# Patient Record
Sex: Male | Born: 1935
Health system: Southern US, Community
[De-identification: ages and names within clinical notes are randomized; demographics above are authoritative.]

## PROBLEM LIST (undated history)

## (undated) DIAGNOSIS — K922 Gastrointestinal hemorrhage, unspecified: Secondary | ICD-10-CM

## (undated) DIAGNOSIS — T7840XA Allergy, unspecified, initial encounter: Secondary | ICD-10-CM

## (undated) DIAGNOSIS — I10 Essential (primary) hypertension: Secondary | ICD-10-CM

## (undated) DIAGNOSIS — K219 Gastro-esophageal reflux disease without esophagitis: Secondary | ICD-10-CM

## (undated) DIAGNOSIS — K635 Polyp of colon: Secondary | ICD-10-CM

## (undated) DIAGNOSIS — K920 Hematemesis: Secondary | ICD-10-CM

## (undated) DIAGNOSIS — I509 Heart failure, unspecified: Secondary | ICD-10-CM

## (undated) DIAGNOSIS — J439 Emphysema, unspecified: Secondary | ICD-10-CM

## (undated) DIAGNOSIS — K819 Cholecystitis, unspecified: Secondary | ICD-10-CM

## (undated) DIAGNOSIS — I2510 Atherosclerotic heart disease of native coronary artery without angina pectoris: Secondary | ICD-10-CM

## (undated) DIAGNOSIS — C61 Malignant neoplasm of prostate: Secondary | ICD-10-CM

## (undated) DIAGNOSIS — F17200 Nicotine dependence, unspecified, uncomplicated: Secondary | ICD-10-CM

## (undated) DIAGNOSIS — IMO0002 Reserved for concepts with insufficient information to code with codable children: Secondary | ICD-10-CM

## (undated) DIAGNOSIS — R011 Cardiac murmur, unspecified: Secondary | ICD-10-CM

## (undated) DIAGNOSIS — M1990 Unspecified osteoarthritis, unspecified site: Secondary | ICD-10-CM

## (undated) DIAGNOSIS — J449 Chronic obstructive pulmonary disease, unspecified: Secondary | ICD-10-CM

## (undated) DIAGNOSIS — K859 Acute pancreatitis without necrosis or infection, unspecified: Secondary | ICD-10-CM

## (undated) DIAGNOSIS — E119 Type 2 diabetes mellitus without complications: Secondary | ICD-10-CM

## (undated) DIAGNOSIS — E7800 Pure hypercholesterolemia, unspecified: Secondary | ICD-10-CM

## (undated) DIAGNOSIS — B019 Varicella without complication: Secondary | ICD-10-CM

## (undated) HISTORY — DX: Cardiac murmur, unspecified: R01.1

## (undated) HISTORY — DX: Hematemesis: K92.0

## (undated) HISTORY — PX: HEMORRHOID SURGERY: SHX153

## (undated) HISTORY — DX: Cholecystitis, unspecified: K81.9

## (undated) HISTORY — PX: APPENDECTOMY: SHX54

## (undated) HISTORY — PX: COLONOSCOPY: SHX174

## (undated) HISTORY — DX: Emphysema, unspecified: J43.9

## (undated) HISTORY — DX: Polyp of colon: K63.5

## (undated) HISTORY — DX: Nicotine dependence, unspecified, uncomplicated: F17.200

## (undated) HISTORY — DX: Essential (primary) hypertension: I10

## (undated) HISTORY — PX: SP CHOLECYSTOMY: HXRAD409

## (undated) HISTORY — PX: CHOLECYSTECTOMY: SHX55

## (undated) HISTORY — DX: Unspecified osteoarthritis, unspecified site: M19.90

## (undated) HISTORY — DX: Allergy, unspecified, initial encounter: T78.40XA

## (undated) HISTORY — DX: Varicella without complication: B01.9

## (undated) HISTORY — PX: CATARACT EXTRACTION: SUR2

## (undated) HISTORY — PX: CIRCUMCISION: SUR203

## (undated) HISTORY — DX: Chronic obstructive pulmonary disease, unspecified: J44.9

## (undated) HISTORY — DX: Pure hypercholesterolemia, unspecified: E78.00

## (undated) HISTORY — DX: Malignant neoplasm of prostate: C61

## (undated) HISTORY — PX: CARDIAC CATHETERIZATION: SHX172

## (undated) HISTORY — DX: Heart failure, unspecified: I50.9

## (undated) HISTORY — DX: Reserved for concepts with insufficient information to code with codable children: IMO0002

## (undated) HISTORY — DX: Atherosclerotic heart disease of native coronary artery without angina pectoris: I25.10

## (undated) HISTORY — DX: Acute pancreatitis without necrosis or infection, unspecified: K85.90

## (undated) HISTORY — DX: Gastro-esophageal reflux disease without esophagitis: K21.9

## (undated) HISTORY — PX: STOMACH SURGERY: SHX791

## (undated) HISTORY — DX: Gastrointestinal hemorrhage, unspecified: K92.2

---

## 1978-04-11 DIAGNOSIS — K922 Gastrointestinal hemorrhage, unspecified: Secondary | ICD-10-CM

## 1978-04-11 HISTORY — DX: Gastrointestinal hemorrhage, unspecified: K92.2

## 2004-09-07 ENCOUNTER — Ambulatory Visit: Payer: Self-pay | Admitting: Ophthalmology

## 2005-02-07 ENCOUNTER — Ambulatory Visit: Payer: Self-pay | Admitting: Unknown Physician Specialty

## 2005-03-29 ENCOUNTER — Ambulatory Visit: Payer: Self-pay | Admitting: Urology

## 2005-03-29 IMAGING — NM NUCLEAR MEDICINE WHOLE BODY BONE SCINTIGRAPHY
1 series · 2 of 2 positions shown · non-contrast
Comparison: none

REASON FOR EXAM: Prostate CA
COMMENTS:

[Series 1: 3 hr wholebody · 2.40mm/px · 2 of 2 frames shown]
[frame 1/2]
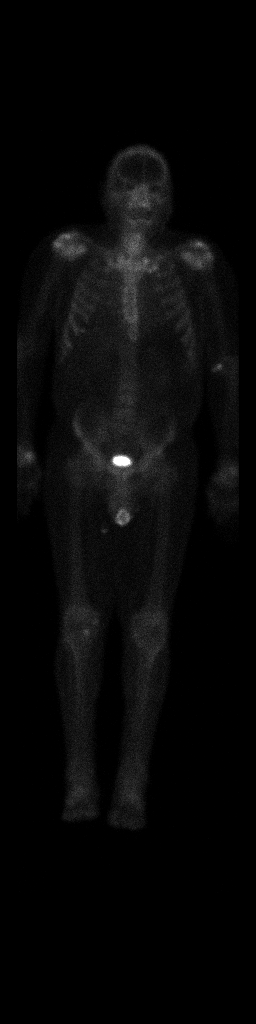
[frame 2/2]
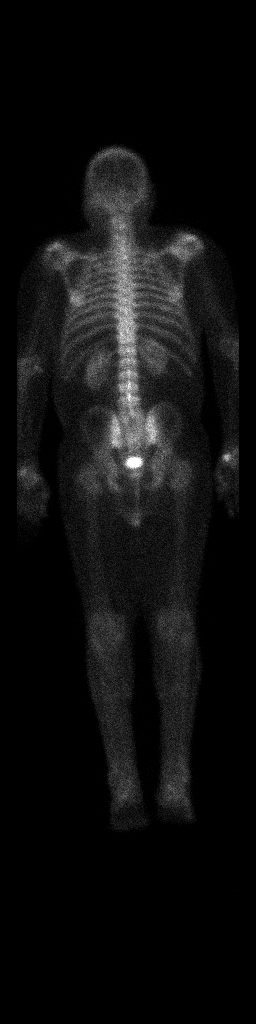

[2 of 2 positions shown; findings below may reference images not displayed]

PROCEDURE:     NM  - NM BONE WB 3 HR [DATE] [DATE]

RESULT:        The patient received an injection of 20.08 mCi Tc 99m labeled
MDP for the study.  Injection site in the LEFT antecubital region is
visible.  There is increased localization in the LEFT sternoclavicular
joint, both knees especially medially on the RIGHT and in the RIGHT wrist in
the area of the base of the thumb.  Degenerative changes are also noted in
the shoulders.
IMPRESSION: Some areas of abnormal localization as described which most likely represent
degenerative changes.  No definite evidence of metastatic disease.

## 2005-04-14 ENCOUNTER — Ambulatory Visit: Payer: Self-pay | Admitting: Radiation Oncology

## 2005-04-19 IMAGING — CT CT GUIDANCE PLACEMENT RAD THERAPY FIELDS
1 series · 16 of 32 positions shown, 20 images · non-contrast
Comparison: none

[Series 2: tx planning · axial · 0.98mm/px · z∈[-148,+110]mm · 16 of 113 slices shown, 20 images]
[im 8/113  soft-tissue]
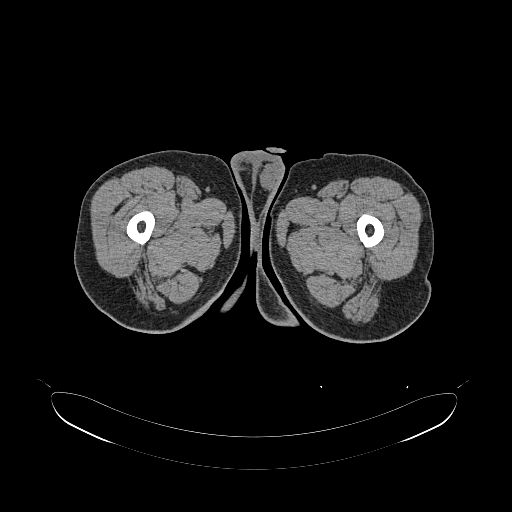
[im 8/113  bone]
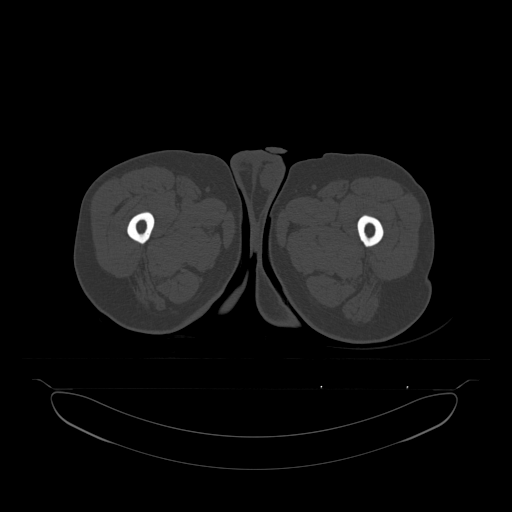
[im 15/113  soft-tissue]
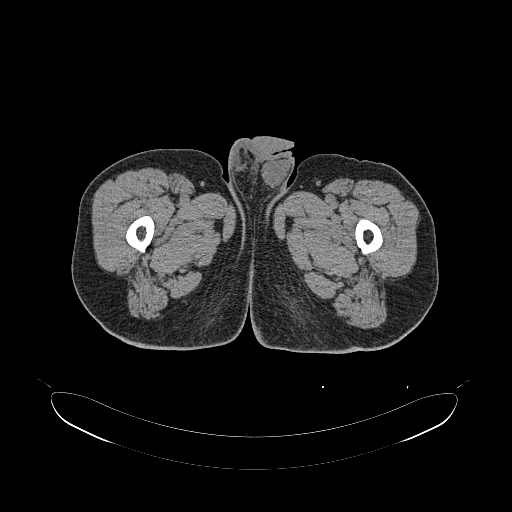
[im 22/113  soft-tissue]
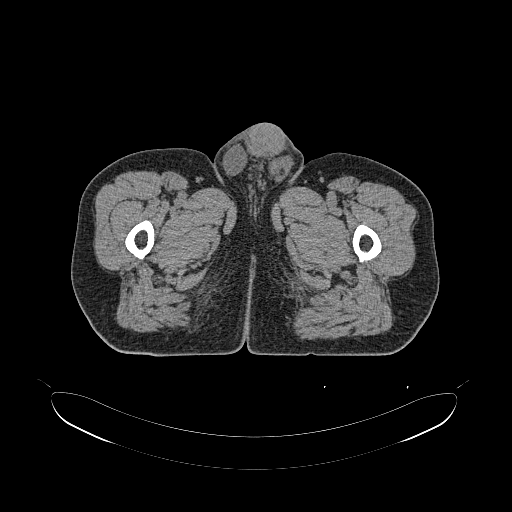
[im 29/113  soft-tissue]
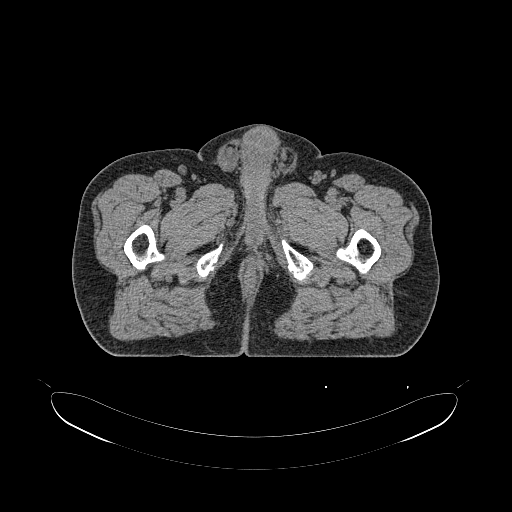
[im 37/113  soft-tissue]
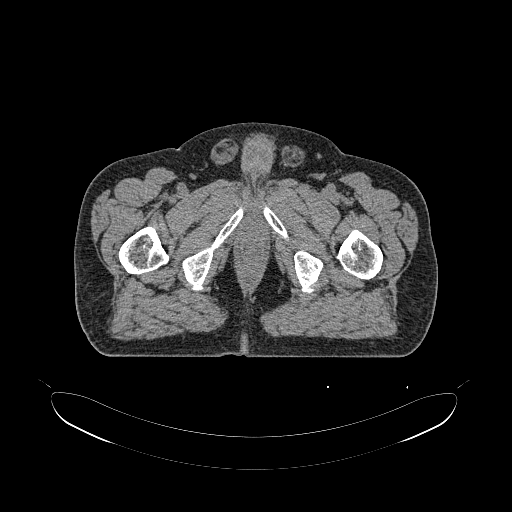
[im 44/113  soft-tissue]
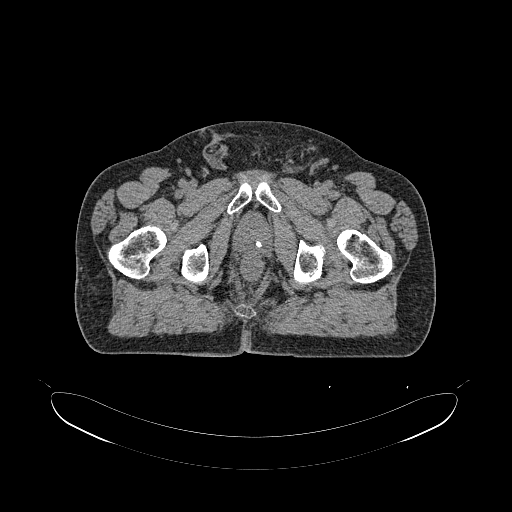
[im 51/113  soft-tissue]
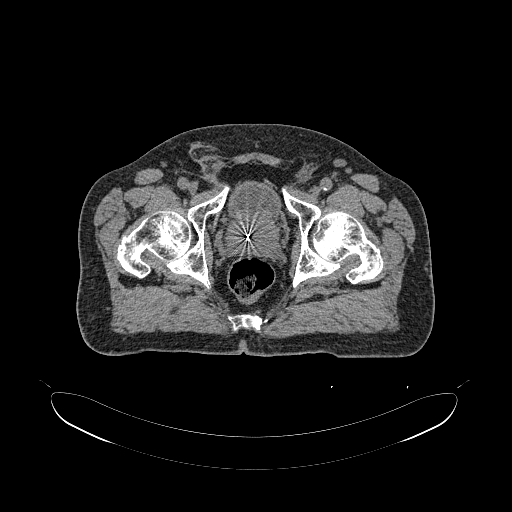
[im 62/113  soft-tissue]
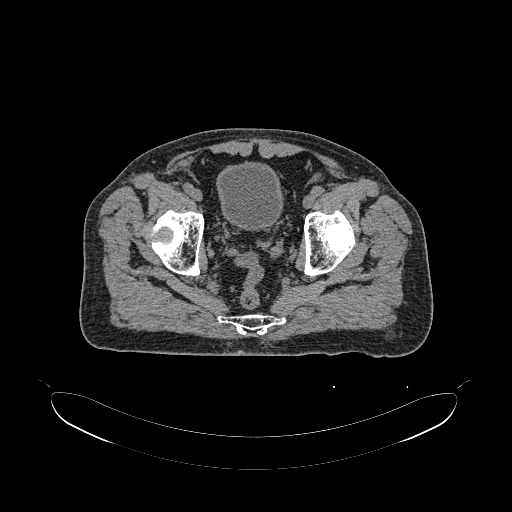
[im 69/113  soft-tissue]
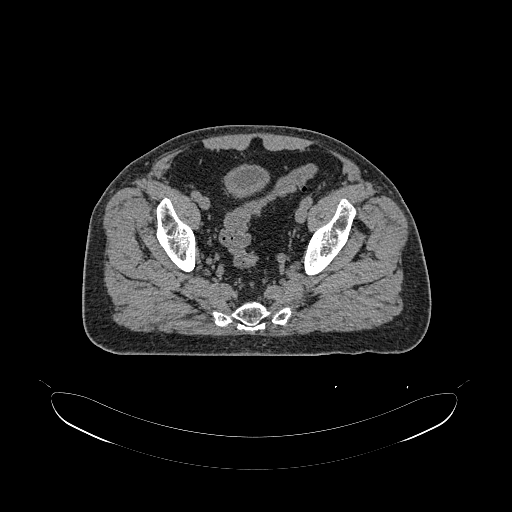
[im 69/113  bone]
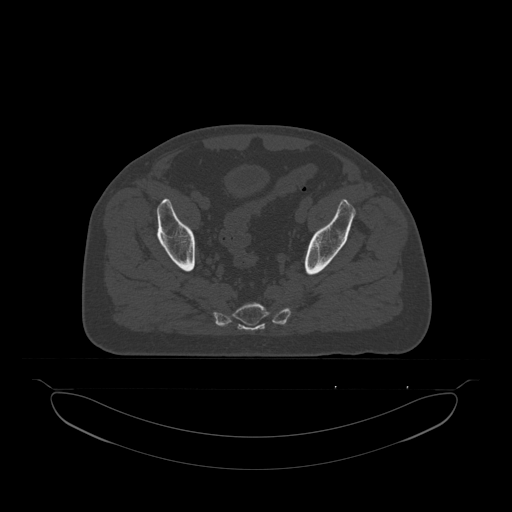
[im 76/113  soft-tissue]
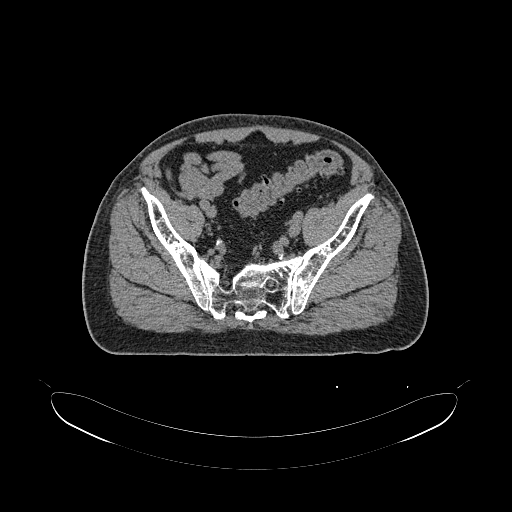
[im 84/113  soft-tissue]
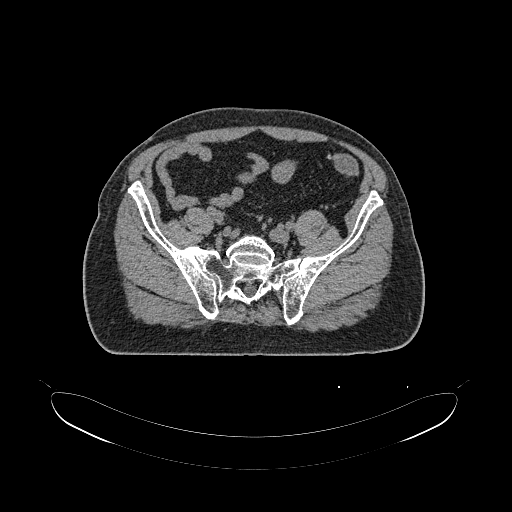
[im 91/113  soft-tissue]
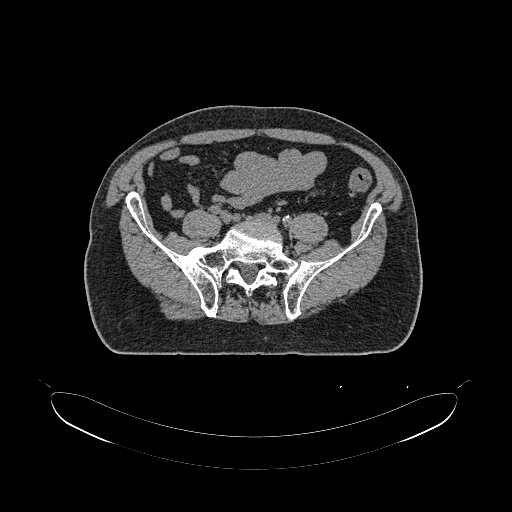
[im 98/113  soft-tissue]
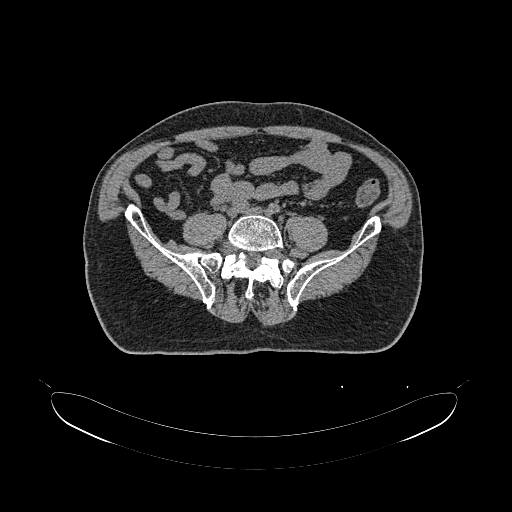
[im 98/113  lung]
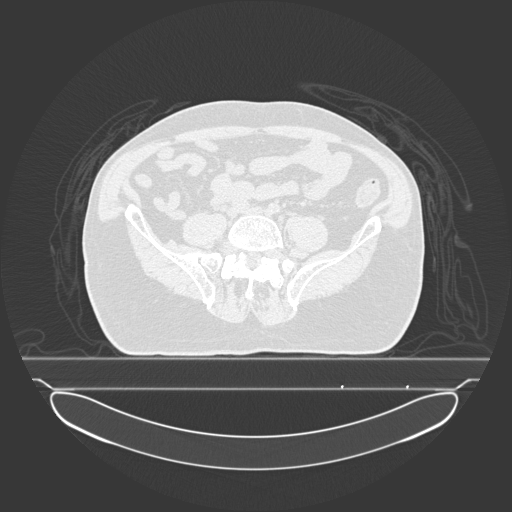
[im 102/113  lung]
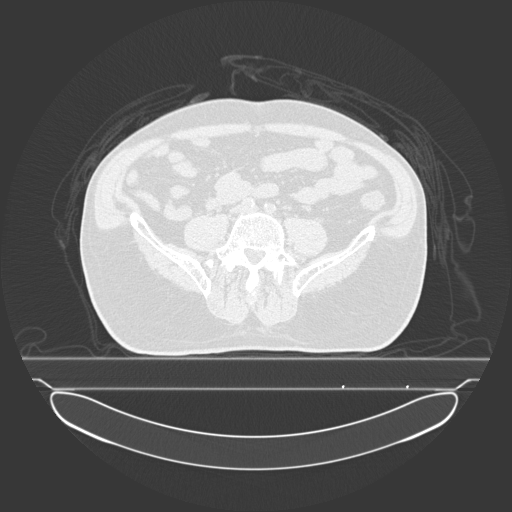
[im 105/113  soft-tissue]
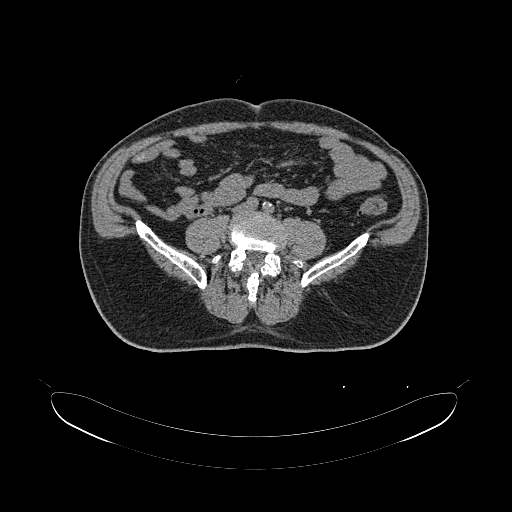
[im 105/113  lung]
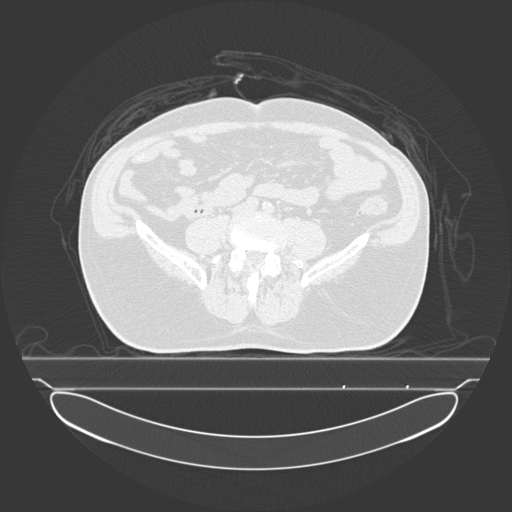
[im 109/113  lung]
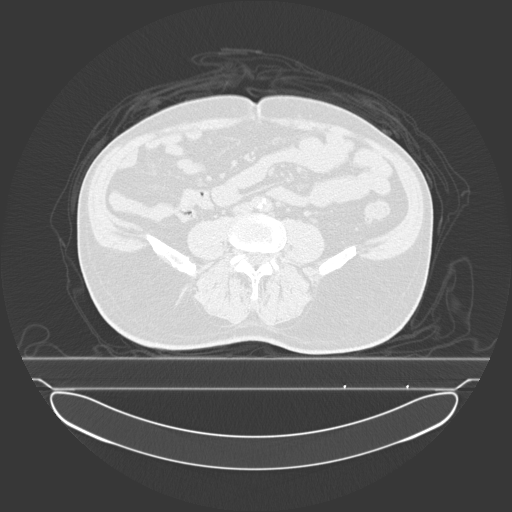

[16 of 32 positions shown; findings below may reference images not displayed]

IMAGES IMPORTED FROM THE [employer] WORKFLOW SYSTEM
NO DICTATION FOR STUDY

## 2005-05-12 ENCOUNTER — Ambulatory Visit: Payer: Self-pay | Admitting: Radiation Oncology

## 2005-06-09 ENCOUNTER — Ambulatory Visit: Payer: Self-pay | Admitting: Radiation Oncology

## 2005-07-10 ENCOUNTER — Ambulatory Visit: Payer: Self-pay | Admitting: Radiation Oncology

## 2005-10-05 ENCOUNTER — Ambulatory Visit: Payer: Self-pay | Admitting: Unknown Physician Specialty

## 2006-04-09 ENCOUNTER — Other Ambulatory Visit: Payer: Self-pay

## 2006-04-09 ENCOUNTER — Emergency Department: Payer: Self-pay | Admitting: Unknown Physician Specialty

## 2006-04-09 IMAGING — CR DG CHEST 1V PORT
1 series · 1 of 1 positions shown · non-contrast
Comparison: none

REASON FOR EXAM: Chest pain
COMMENTS:

PROCEDURE:     DXR - DXR PORTABLE CHEST SINGLE VIEW  - [DATE]  [DATE]
RESULT:     The mediastinal and hilar structures are normal. The lungs are
clear. The cardiovascular structures are unremarkable.

[view not recorded]
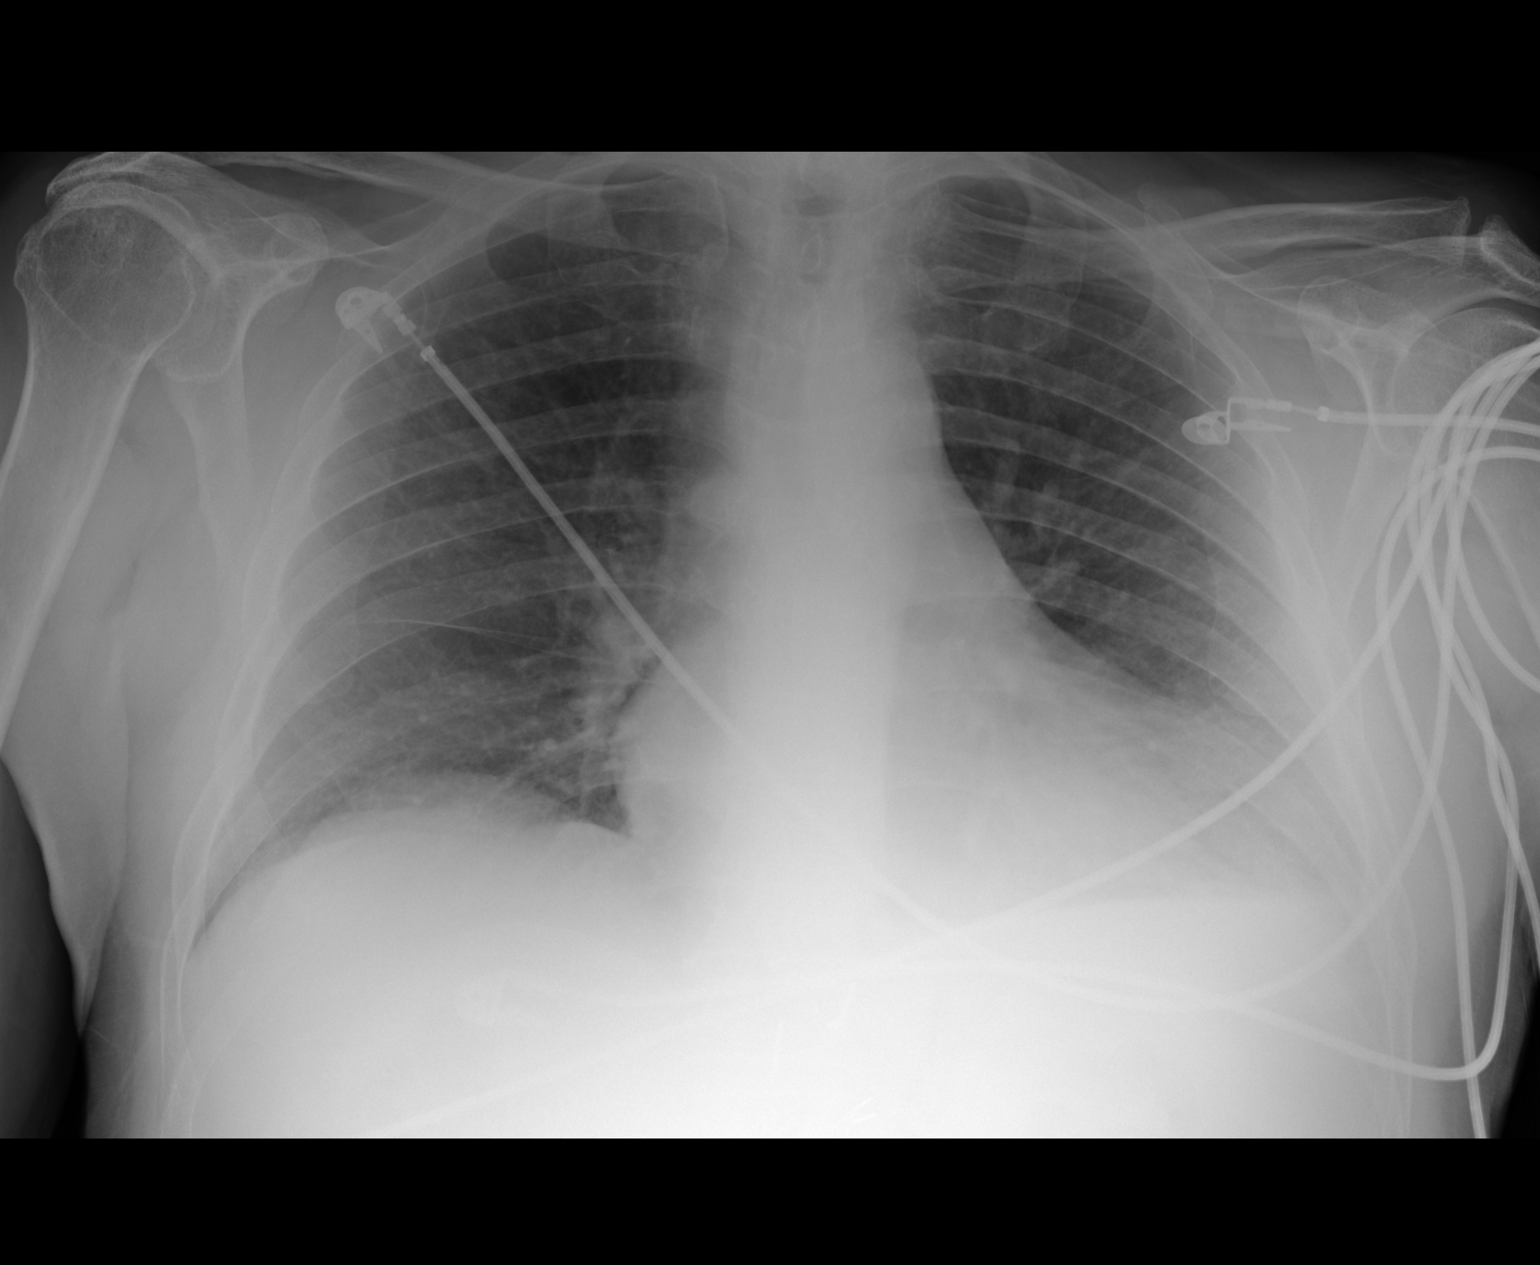

[1 of 1 positions shown; findings below may reference images not displayed]

IMPRESSION: No acute cardiopulmonary disease.

## 2006-04-09 IMAGING — CT CT CHEST W/ CM
2 series · 15 of 31 positions shown, 19 images · IV contrast (APPLIED)
Comparison: none

REASON FOR EXAM: cp ddimer high
COMMENTS:

[Series 4: soft tissue · axial · 0.79mm/px · z∈[-607,-565]mm · 2 of 94 slices shown]
[im 8/94  mediastinal]
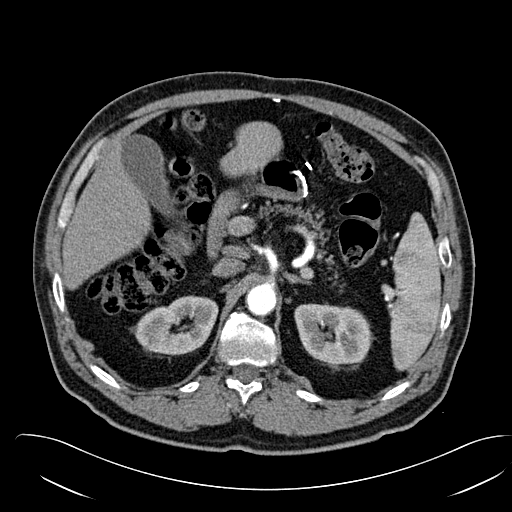
[im 22/94  mediastinal]
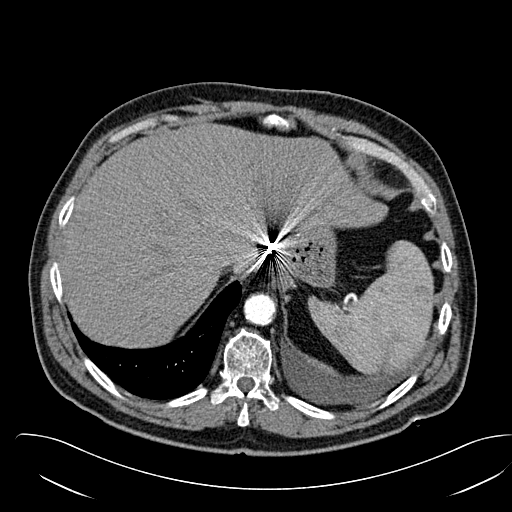

[Series 5: lung windows · axial · 0.79mm/px · z∈[-601,-370]mm · 13 of 91 slices shown, 17 images]
[im 7/91  mediastinal]
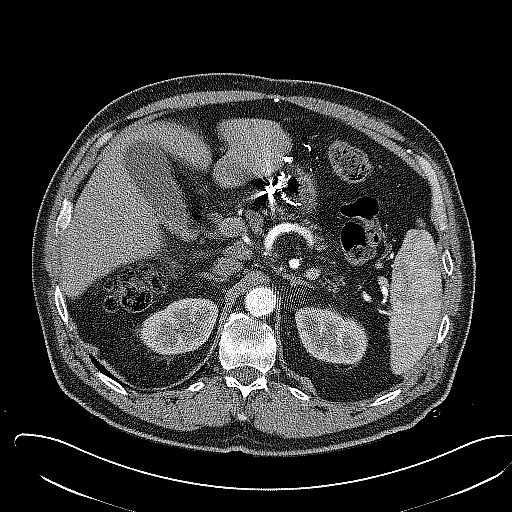
[im 7/91  lung]
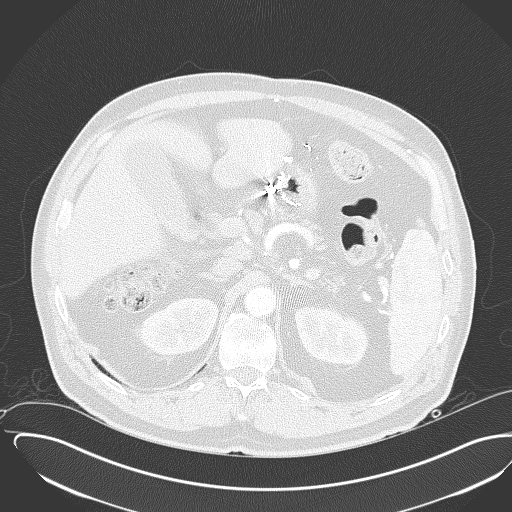
[im 14/91  lung]
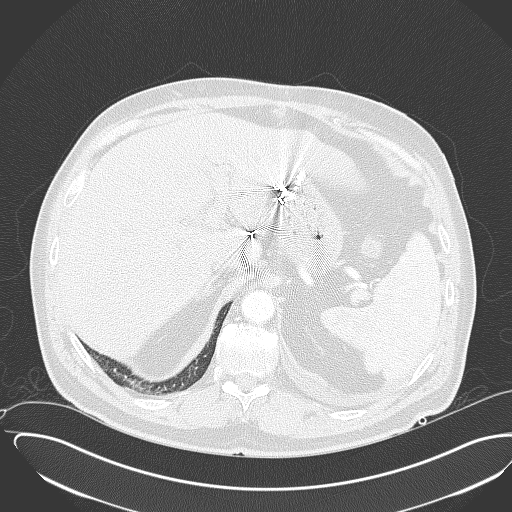
[im 21/91  lung]
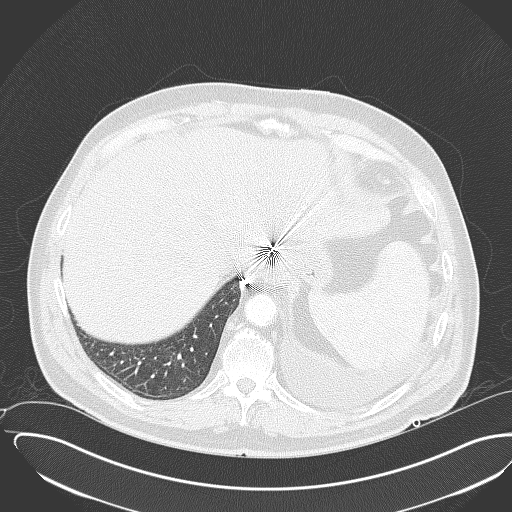
[im 28/91  lung]
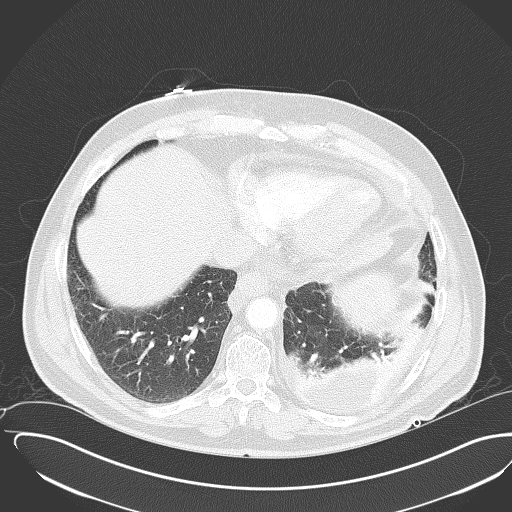
[im 35/91  mediastinal]
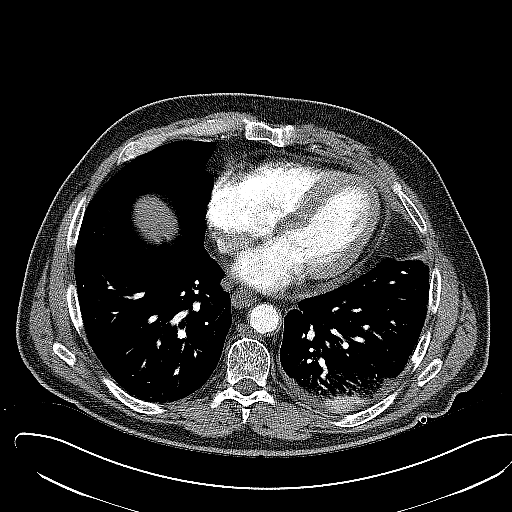
[im 35/91  lung]
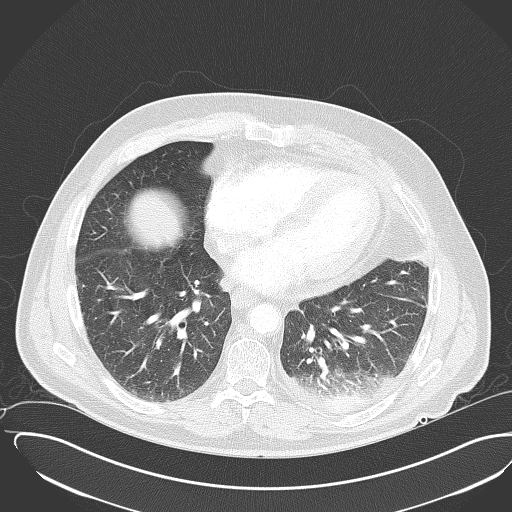
[im 42/91  lung]
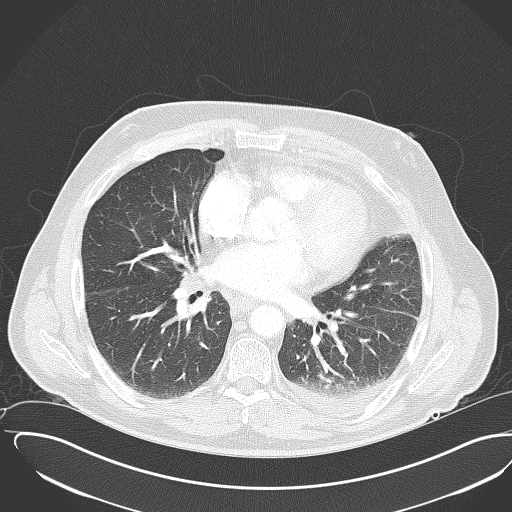
[im 46/91  lung]
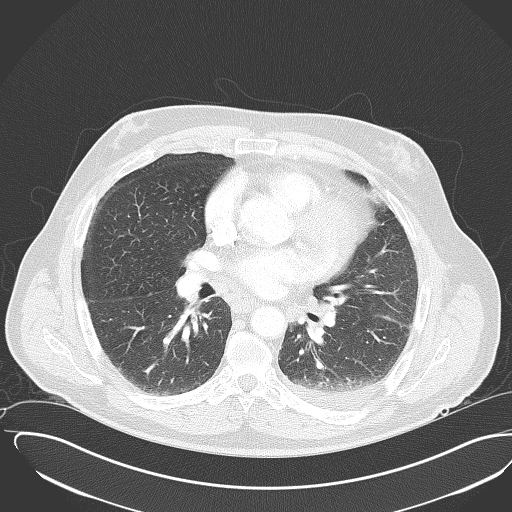
[im 49/91  lung]
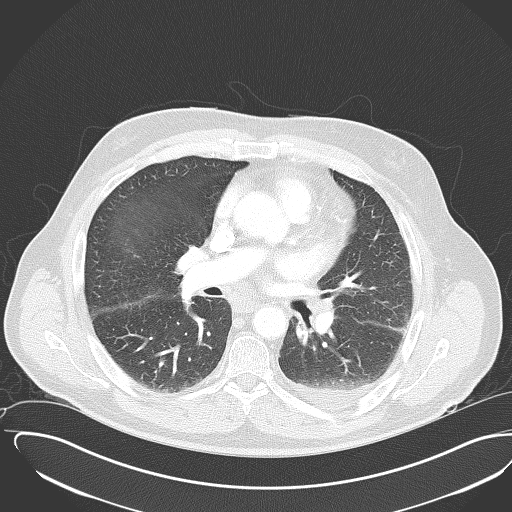
[im 56/91  mediastinal]
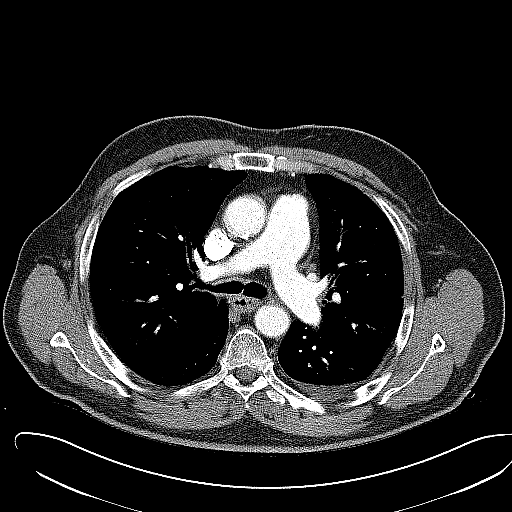
[im 56/91  lung]
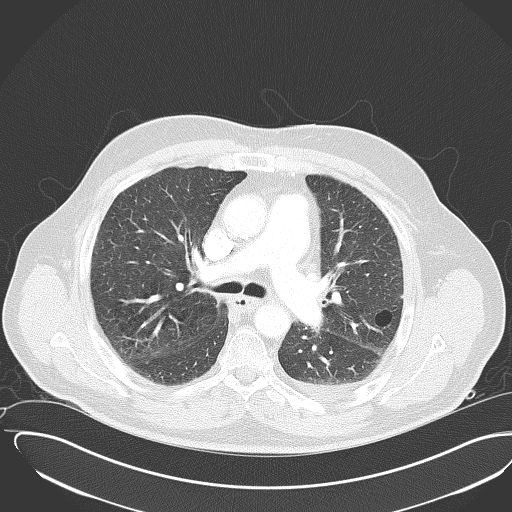
[im 63/91  lung]
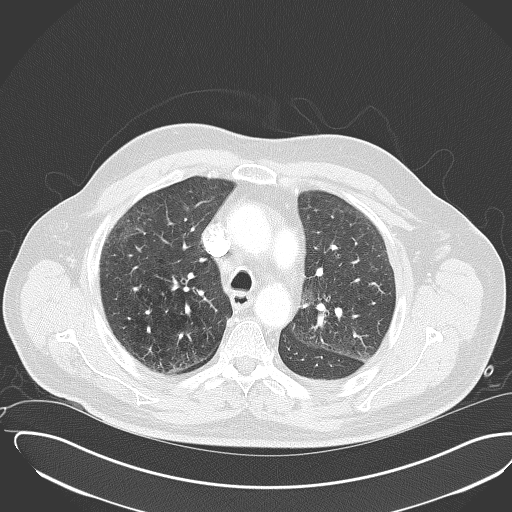
[im 70/91  lung]
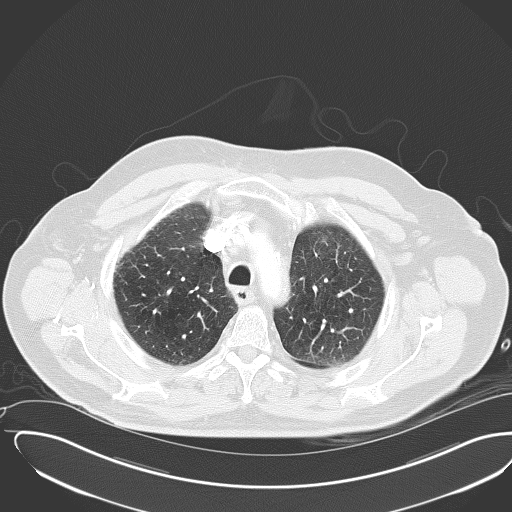
[im 77/91  lung]
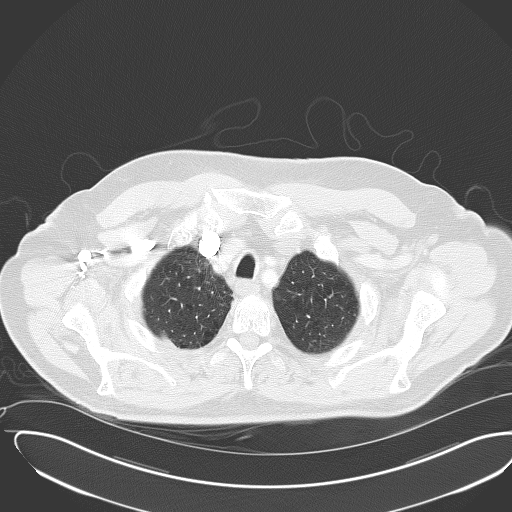
[im 84/91  mediastinal]
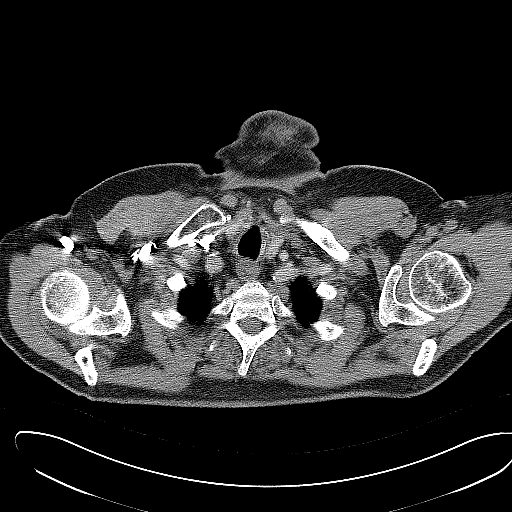
[im 84/91  lung]
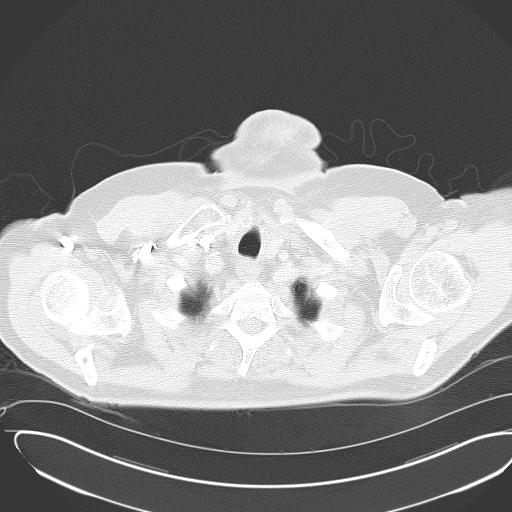

[15 of 31 positions shown; findings below may reference images not displayed]

PROCEDURE:     CT  - CT CHEST (FOR PE) W  - [DATE]  [DATE]

RESULT:     The patient has chest discomfort and elevated D.dimer. The
patient is being evaluated for possible acute pulmonary embolism.  The
patient has a history of prostate malignancy.  The patient also is
experiencing dyspnea. The patient received 100 ml of Isovue for this study.
Contrast within the pulmonary arterial tree is normal in appearance. I do
not see evidence of an acute pulmonary embolism.  There is a small pleural
effusion on the LEFT and there is likely some subpulmonic fluid.  There is
consolidation of portions of the LEFT lower lobe adjacent to this fluid
collection.  The cardiac chambers are normal in size.  There is a small
pericardial effusion. The caliber of the thoracic aorta is normal. The
thoracic vertebral bodies are preserved in height.

Within the upper abdomen the observed portions of the liver exhibit no focal
mass. The gallbladder is normal in appearance. There are numerous surgical
clips adjacent to the stomach.  There is calcification within the liver
consistent with prior granulomatous infection.
IMPRESSION: 1)I do not see evidence of acute pulmonary embolism.

2)There is a small LEFT pleural effusion with a subpulmonic component.
There is a small pericardial effusion.

3)I see no bulky mediastinal or hilar lymph nodes but borderline enlarged
hilar and subcarinal nodes are present bilaterally that are nonspecific.

4)The caliber of the thoracic aorta is normal.

5)There is a small hiatal hernia.  There are surgical clips adjacent to the
stomach in the upper abdomen.

6)There is some atelectasis versus infiltrate associated with the pleural
effusion at the LEFT lung base.

A preliminary report was sent to the Emergency Room at the conclusion of the
study.

## 2007-01-06 LAB — HM COLONOSCOPY: HM Colonoscopy: NORMAL

## 2007-10-01 ENCOUNTER — Inpatient Hospital Stay: Payer: Self-pay | Admitting: *Deleted

## 2007-10-01 ENCOUNTER — Other Ambulatory Visit: Payer: Self-pay

## 2007-10-01 IMAGING — CR DG CHEST 1V PORT
1 series · 1 of 1 positions shown · non-contrast
Comparison: none

REASON FOR EXAM: fever hypotension
COMMENTS:

[view not recorded]
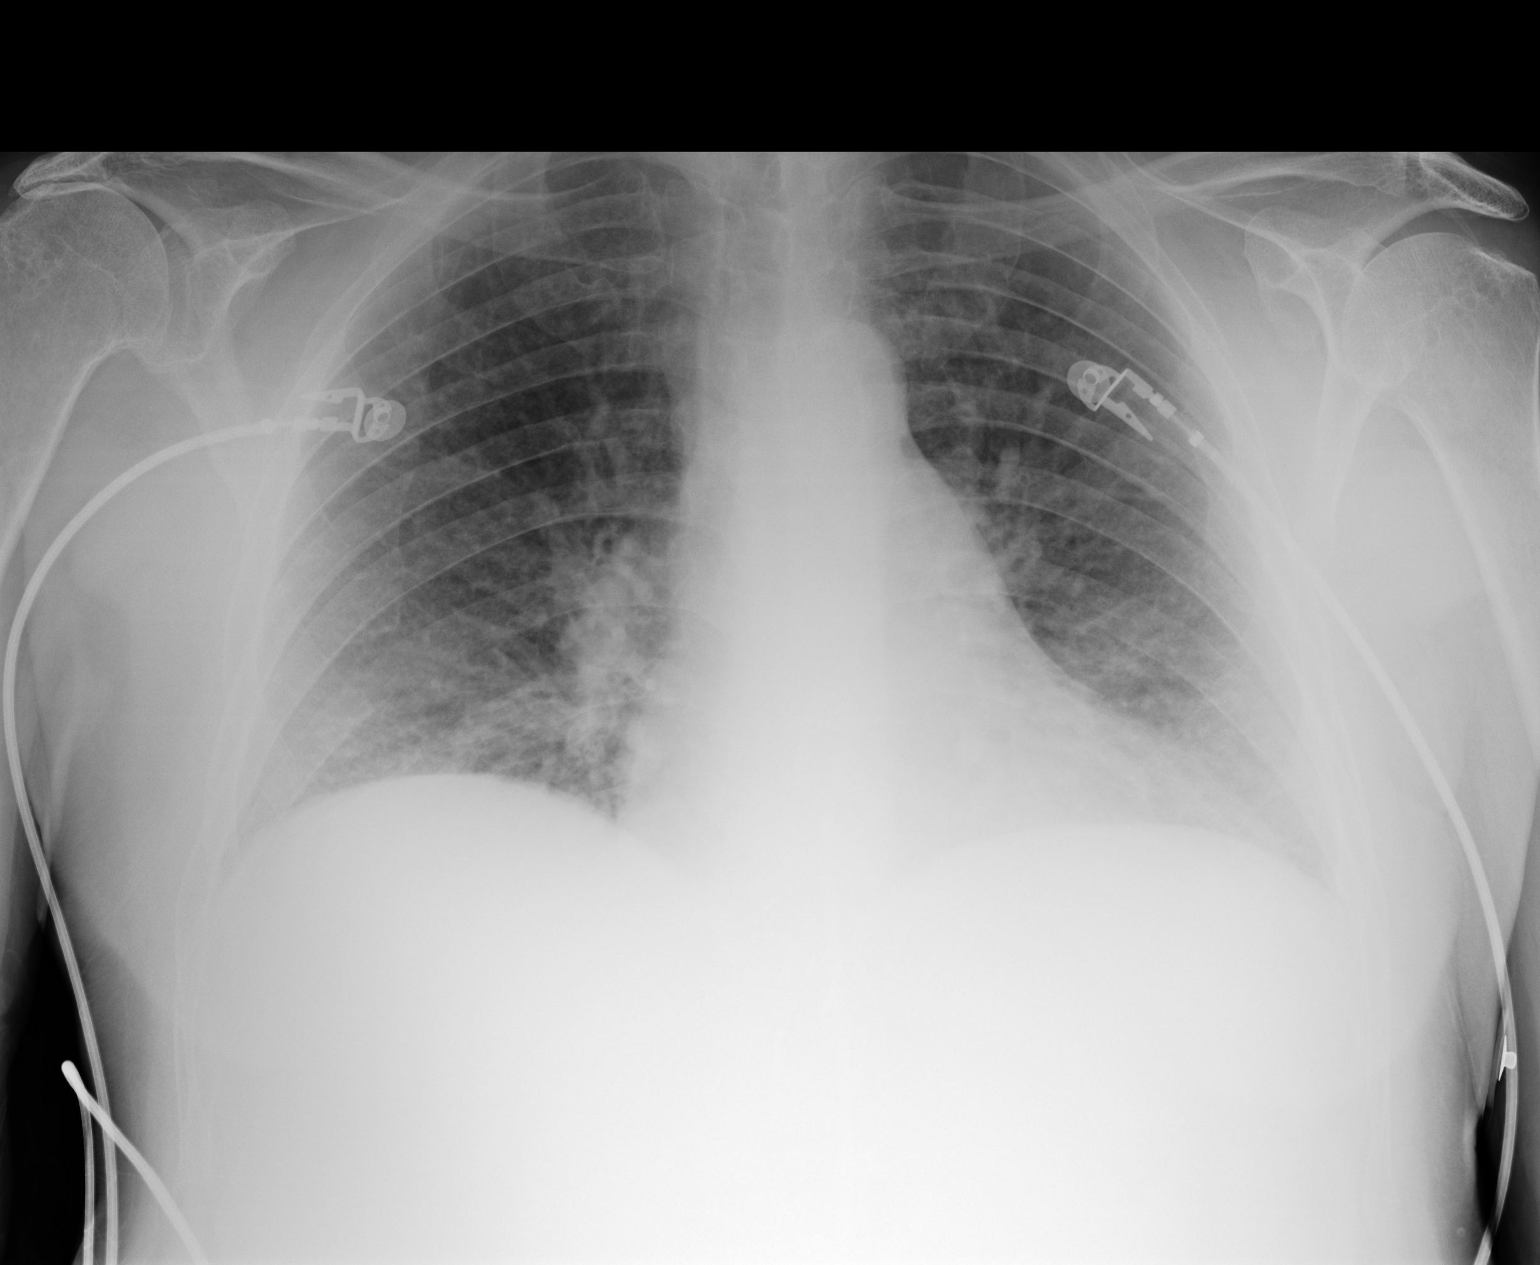

[1 of 1 positions shown; findings below may reference images not displayed]

PROCEDURE:     DXR - DXR PORTABLE CHEST SINGLE VIEW  - [DATE]  [DATE]

RESULT:     Comparison is made to the prior exam of [DATE]. There is
mild prominence of the pulmonary vascularity compatible with pulmonary
vascular congestion and suspicious for congestive heart failure. Similar
findings could be seen with renal failure or overhydration. The heart is
upper limits for normal in size or slightly enlarged. No pleural effusion or
pulmonary edema is seen. Monitoring electrodes are present.
IMPRESSION: There is prominence of the pulmonary vascularity compatible with pulmonary
vascular congestion as noted above.

## 2007-10-01 IMAGING — CT CT STONE STUDY
1 of 2 series · 15 of 32 positions shown, 19 images · non-contrast
Comparison: none

REASON FOR EXAM: (1) l sided abd pain; (2) l sided pel pain
COMMENTS:

[Series 2: soft tissue · axial · 0.67mm/px · z∈[+108,+506]mm · 15 of 145 slices shown, 19 images]
[im 6/145  soft-tissue]
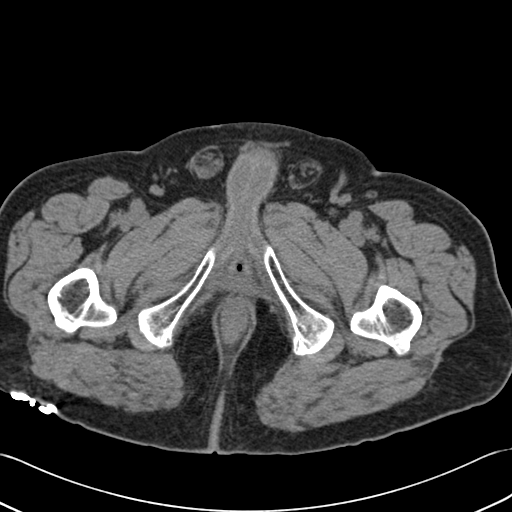
[im 6/145  bone]
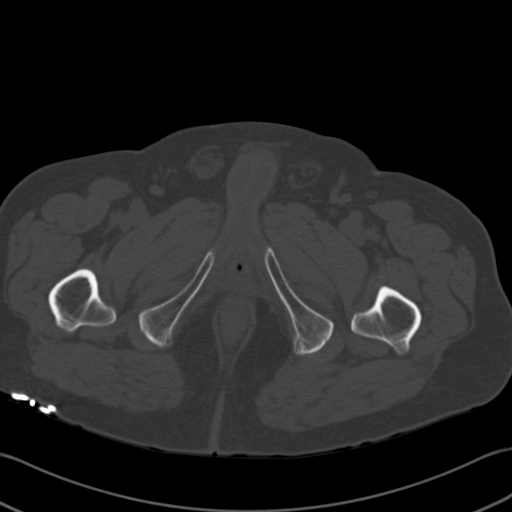
[im 17/145  soft-tissue]
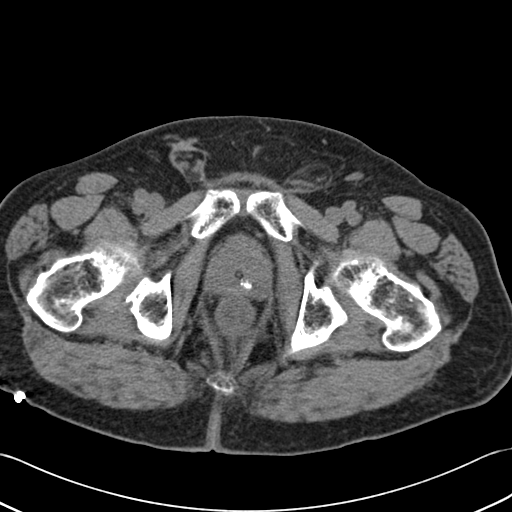
[im 28/145  soft-tissue]
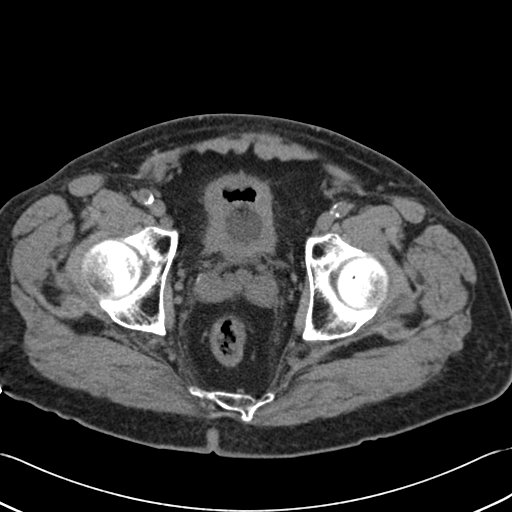
[im 39/145  soft-tissue]
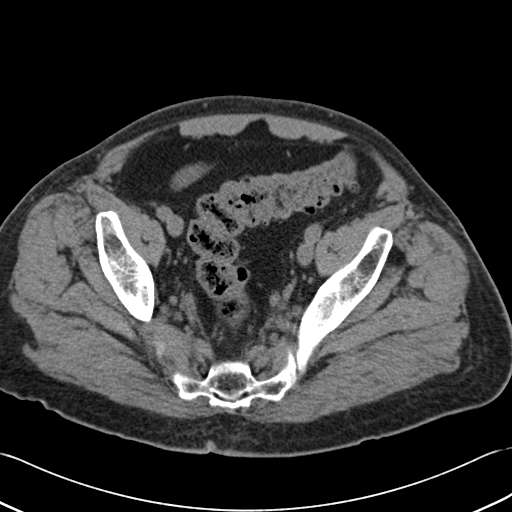
[im 50/145  soft-tissue]
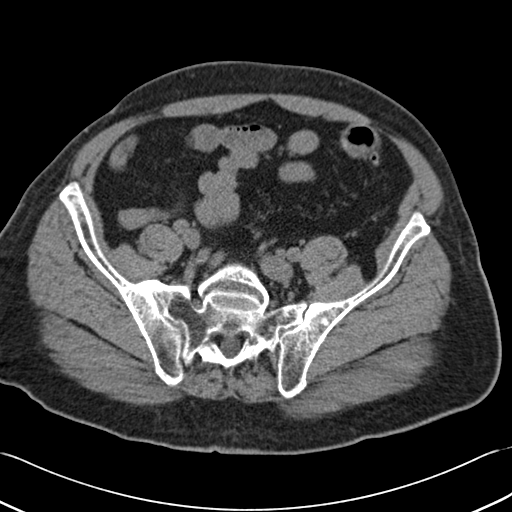
[im 61/145  soft-tissue]
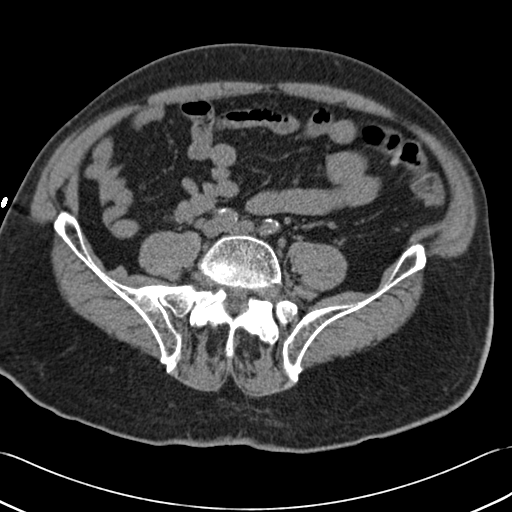
[im 73/145  soft-tissue]
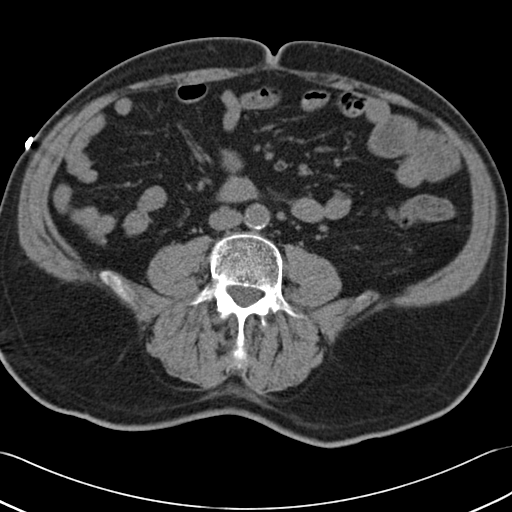
[im 84/145  soft-tissue]
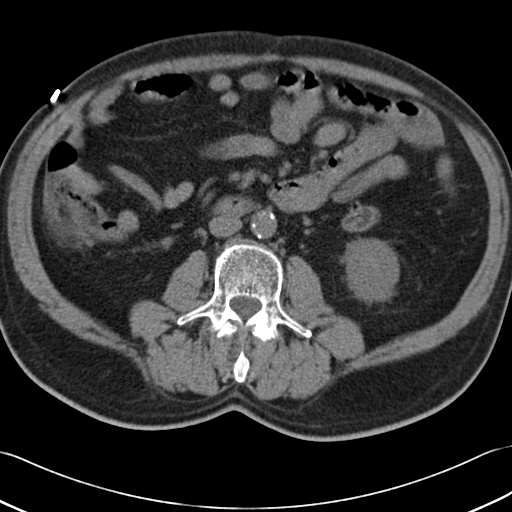
[im 95/145  soft-tissue]
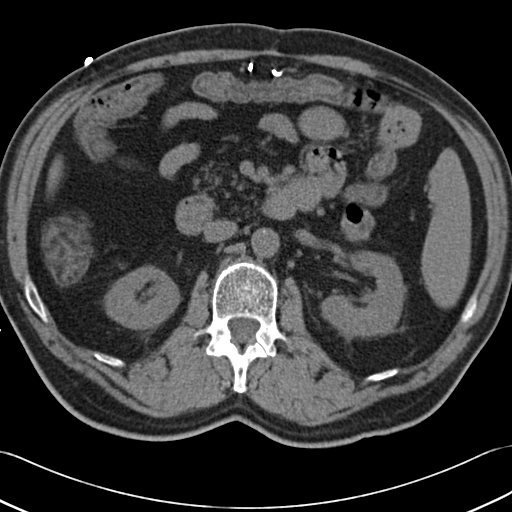
[im 95/145  bone]
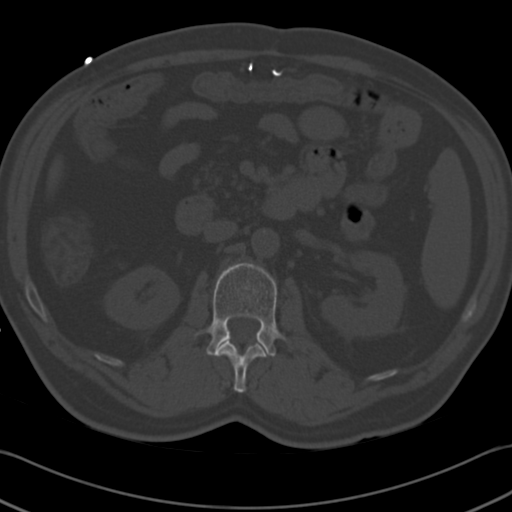
[im 106/145  soft-tissue]
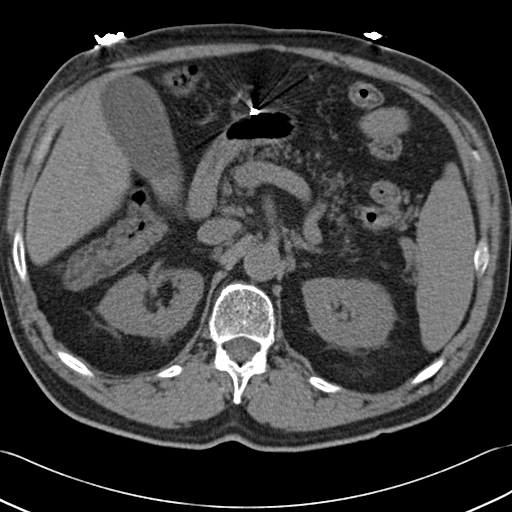
[im 117/145  soft-tissue]
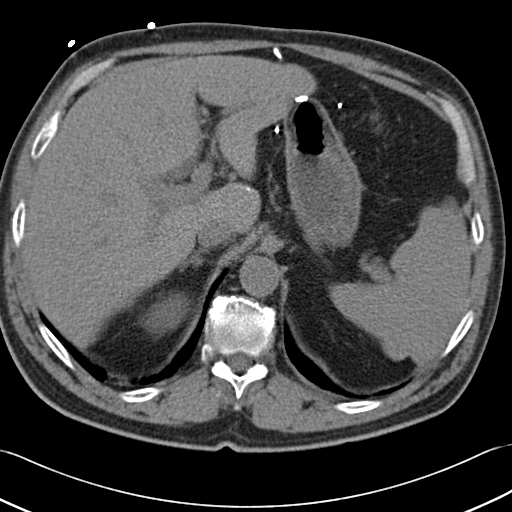
[im 122/145  lung]
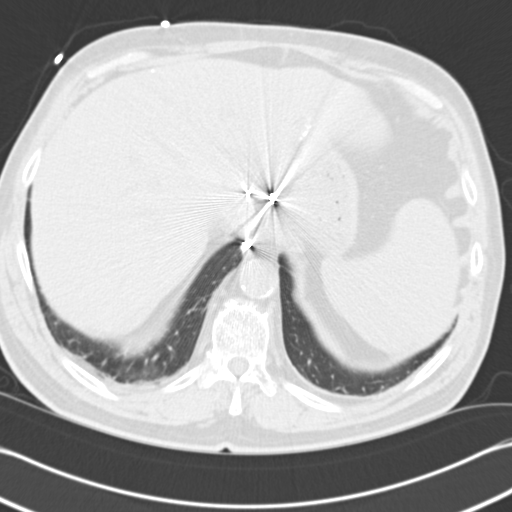
[im 128/145  soft-tissue]
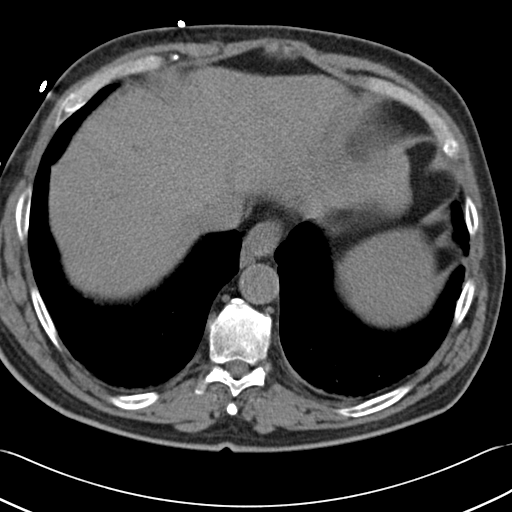
[im 128/145  lung]
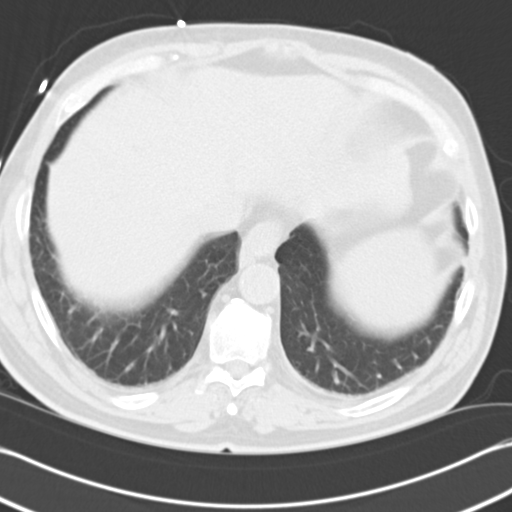
[im 133/145  lung]
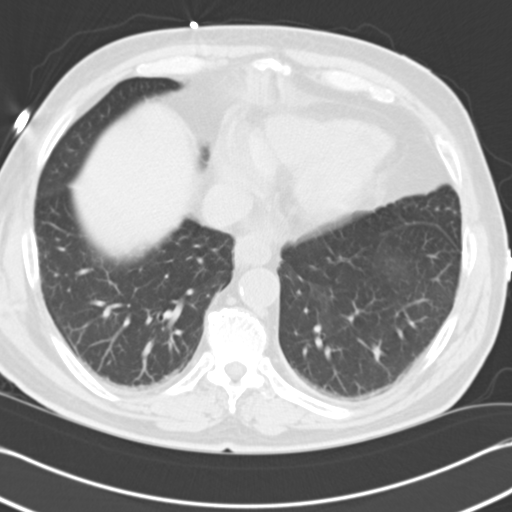
[im 139/145  soft-tissue]
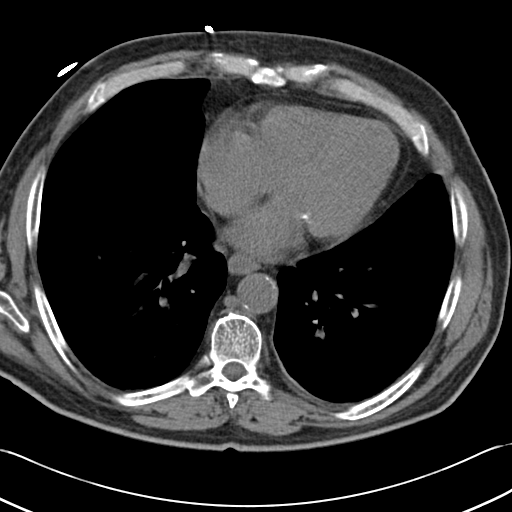
[im 139/145  lung]
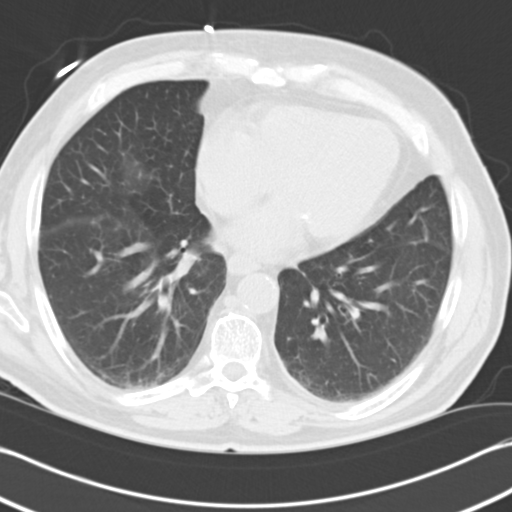

[15 of 32 positions shown; findings below may reference images not displayed]

PROCEDURE:     CT  - CT ABDOMEN /PELVIS WO (STONE)  - [DATE]  [DATE]

RESULT:     Helical noncontrasted 5-mm sections were obtained from the lung
bases through the pubic symphysis.

Evaluation of the lung bases demonstrates no gross abnormalities.

Within the limitations of a noncontrasted CT, the liver, spleen, adrenals,
pancreas and kidneys are unremarkable.  There is no CT evidence of bowel
obstruction.  There are findings suspicious for bowel wall thickening within
the ascending colon and transverse colon.  There is no evidence of
pericolonic free fluid, drainable loculated fluid collections or significant
inflammatory change.  Note, the pancreas is nearly completely fatty
involuted.  There is no CT evidence of an abdominal aortic aneurysm or
abdominal free fluid, drainable loculated fluid collections, masses or
adenopathy within the limitations of a noncontrasted CT.  There is
diverticulosis within the sigmoid colon.
IMPRESSION: 1.     Findings possibly representing inflammatory change or possibly even
infection within the ascending and transverse colon.  Note, this is a
limited evaluation due to the lack of oral and IV contrast. If clinically
warranted, subsequent imaging with oral and IV contrast is recommended.
Otherwise, there is no further regions of abdominal free fluid, drainable
loculated fluid collections, masses or adenopathy.
2.     Dr. JOKO NUGROHO of the [REDACTED] was informed of these
findings at the time of the initial interpretation.

## 2007-10-02 IMAGING — CR DG CHEST 2V
1 series · 2 of 2 positions shown · non-contrast
Comparison: none

REASON FOR EXAM: fever
COMMENTS:

[Series 1: view not recorded · 0.17mm/px · 2 of 2 slices shown]
[im 1/2]
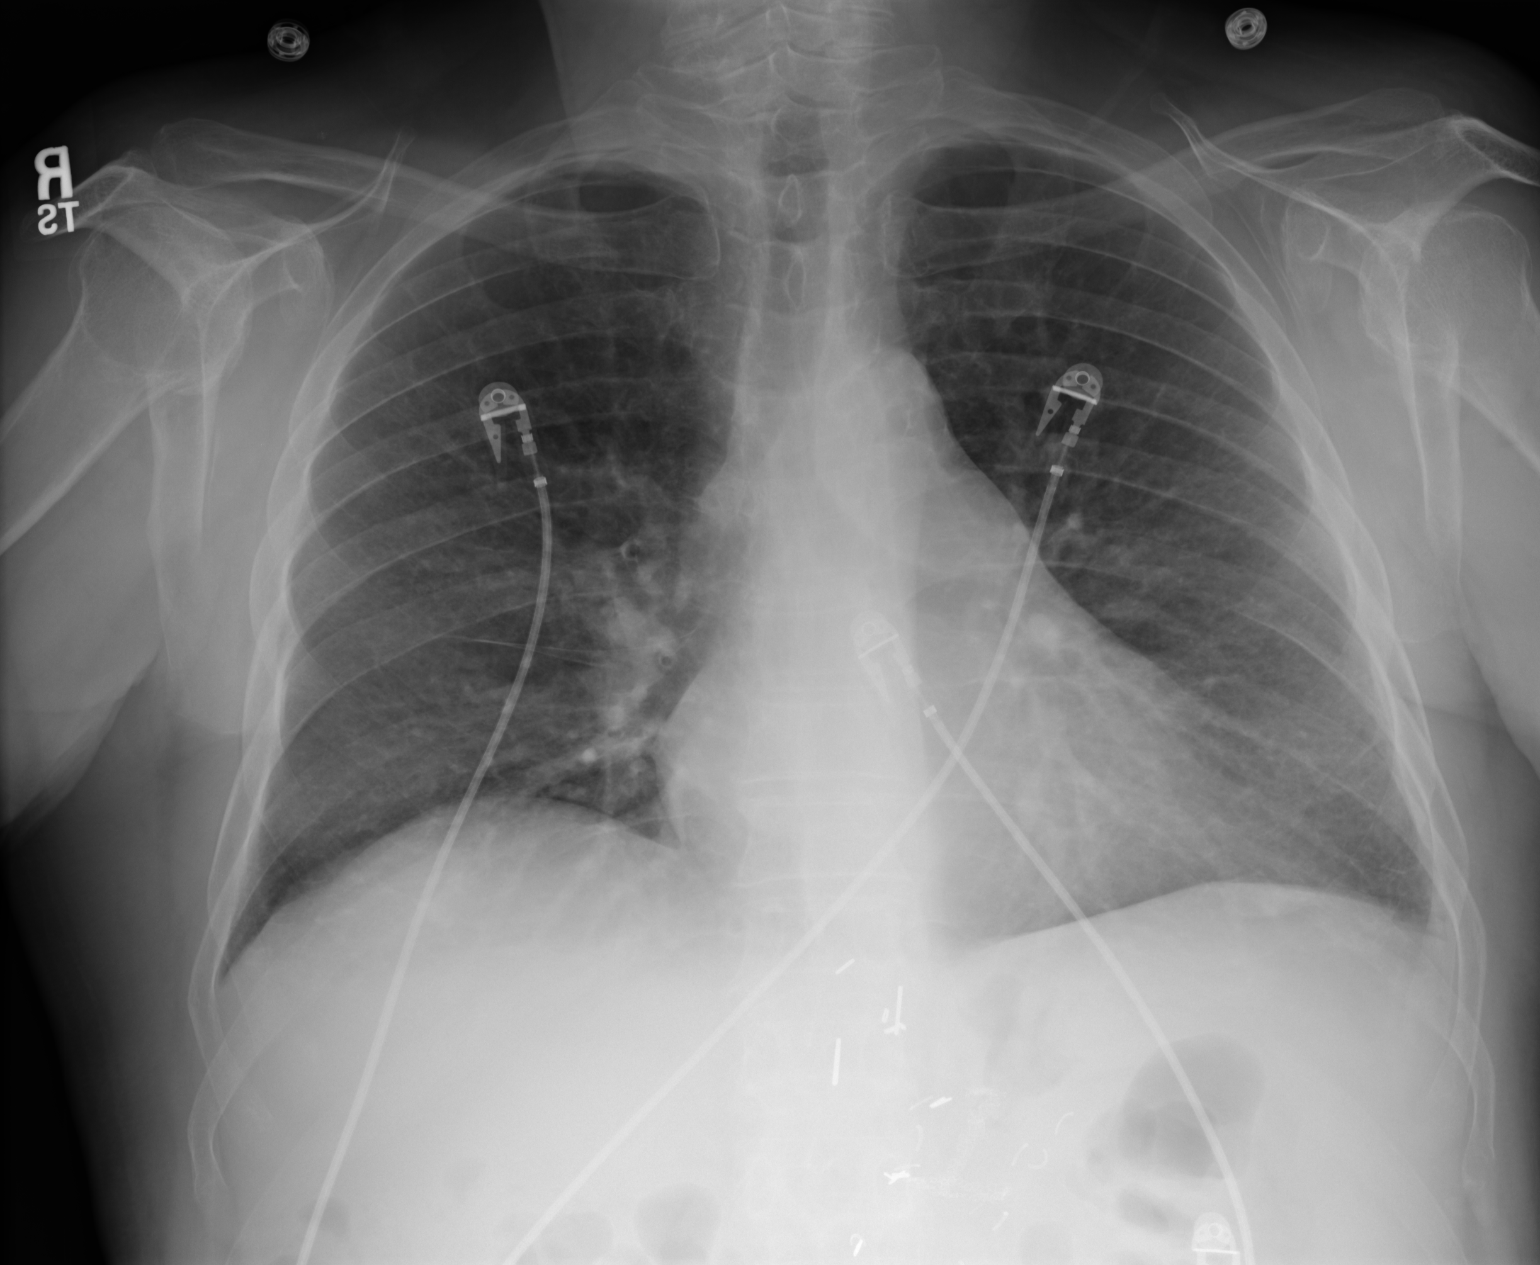
[im 2/2]
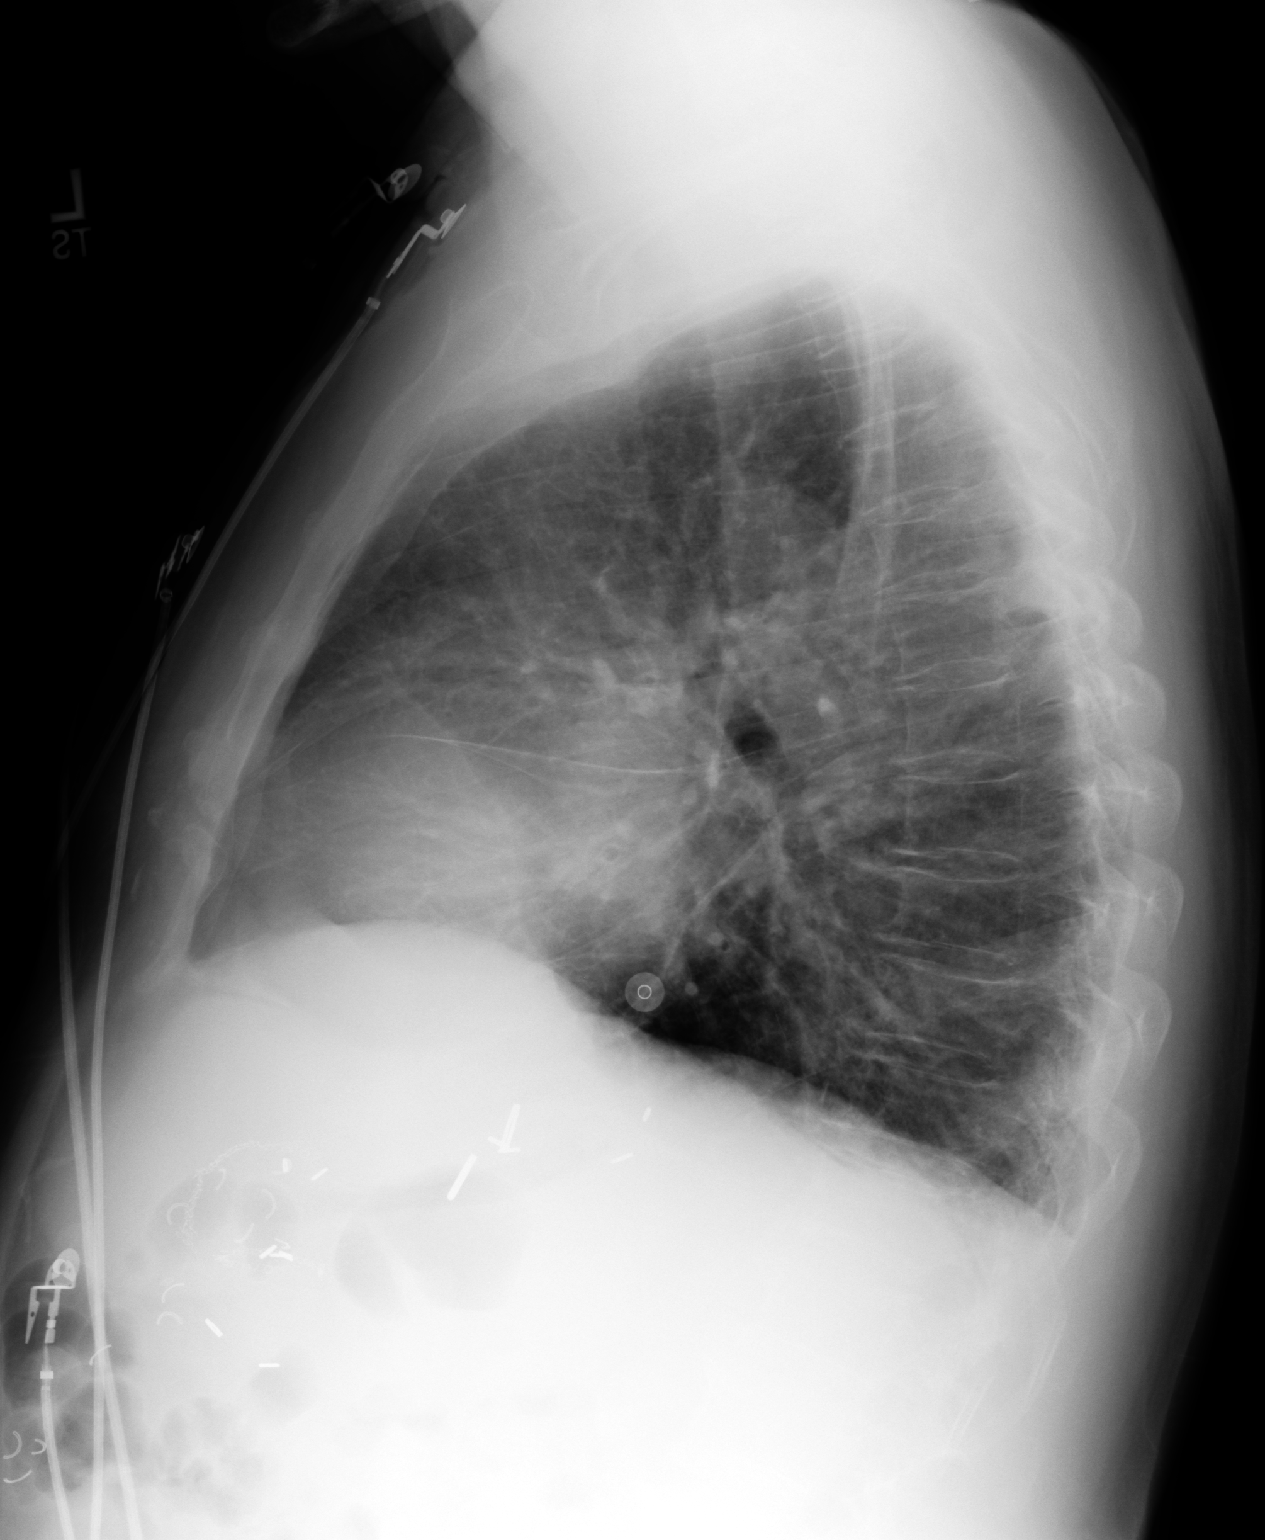

[2 of 2 positions shown; findings below may reference images not displayed]

PROCEDURE:     DXR - DXR CHEST PA (OR AP) AND LATERAL  - [DATE]  [DATE]

RESULT:     Comparison is made to a prior study of [DATE].  Previously
identified prominent pulmonary vascularity and interstitial prominence has
largely resolved with mild residual interstitial markings. The heart size is
normal.  These findings suggest resolving congestive heart failure.
Surgical clips are noted in the gastroesophageal junction.
IMPRESSION: Findings most consistent with resolving congestive heart failure and
pulmonary edema.

## 2007-10-02 IMAGING — CT CT ABD-PELV W/ CM
1 of 2 series · 15 of 32 positions shown, 19 images · non-contrast
Comparison: none

REASON FOR EXAM: (1) right lower abdominal pain, hypotension, previous ct
non contrast s/o inflam
COMMENTS:

PROCEDURE:     CT  - CT ABDOMEN / PELVIS  W  - [DATE]  [DATE]
RESULT:     Comparison: Abdomen pelvis CT without contrast on [DATE].
TECHNIQUE: CT examination of the abdomen and pelvis was performed after
intravenous administration of 80 ml of [FB] nonionic contrast in
addition to oral contrast. Collimation is 5 mm.

[Series 2: soft tissue · axial · 0.79mm/px · z∈[+2,+432]mm · 15 of 94 slices shown, 19 images]
[im 4/94  soft-tissue]
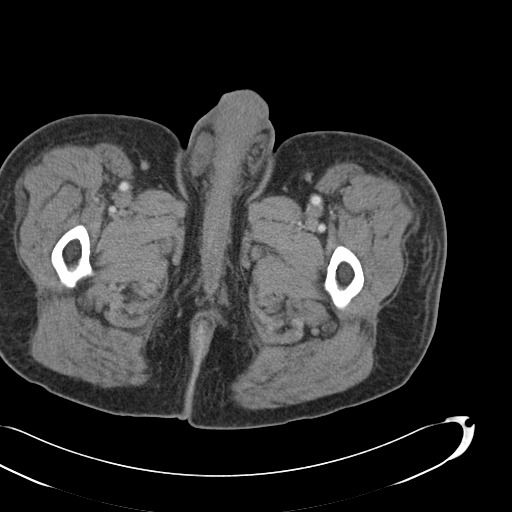
[im 4/94  bone]
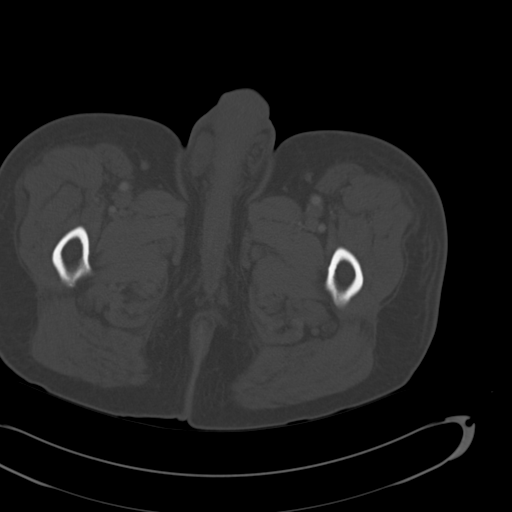
[im 12/94  soft-tissue]
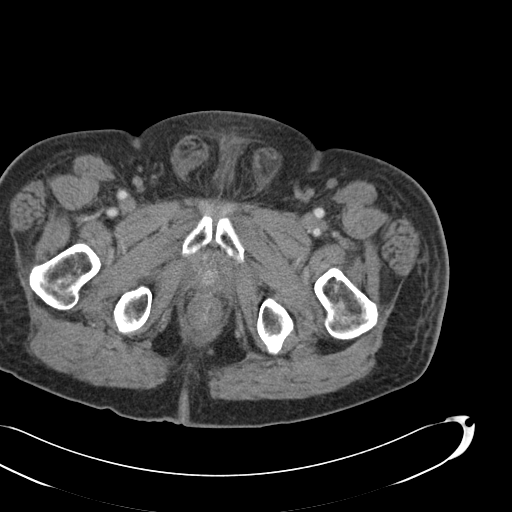
[im 19/94  soft-tissue]
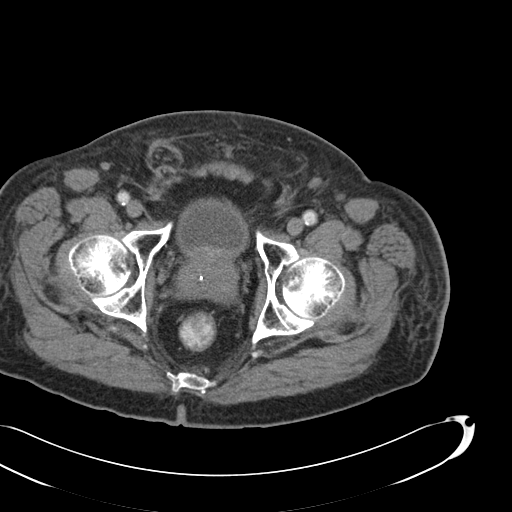
[im 27/94  soft-tissue]
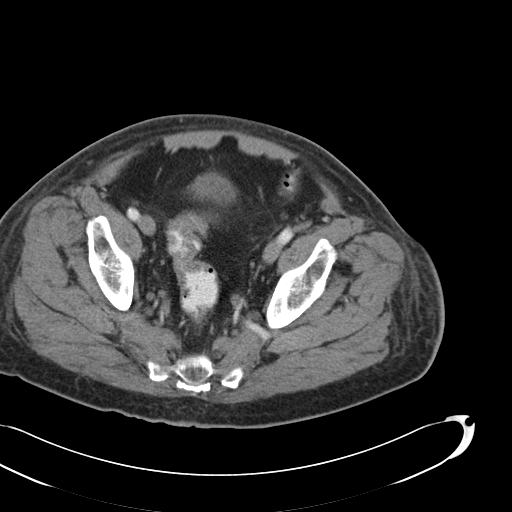
[im 34/94  soft-tissue]
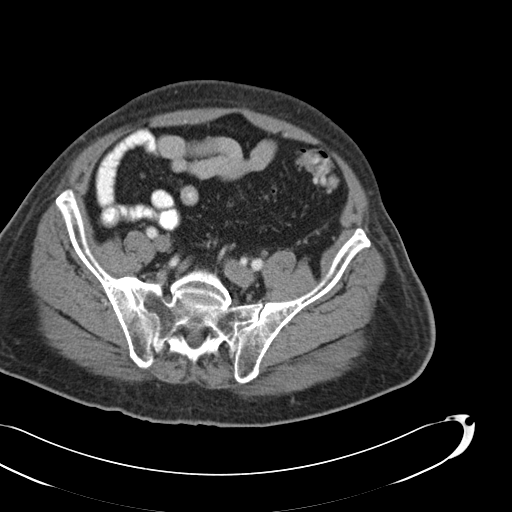
[im 41/94  soft-tissue]
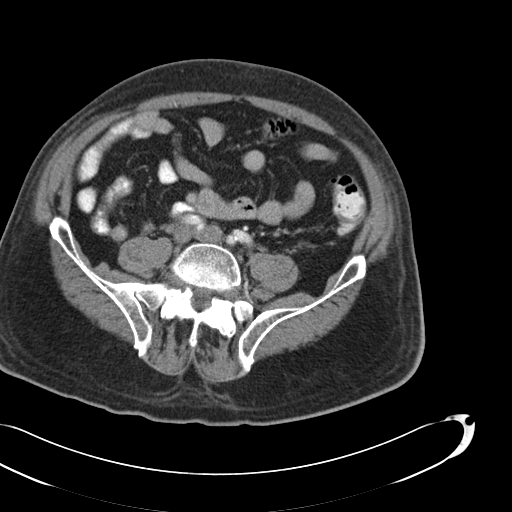
[im 49/94  soft-tissue]
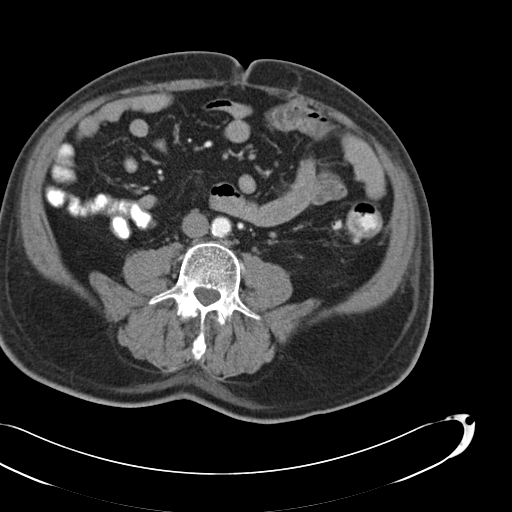
[im 53/94  soft-tissue]
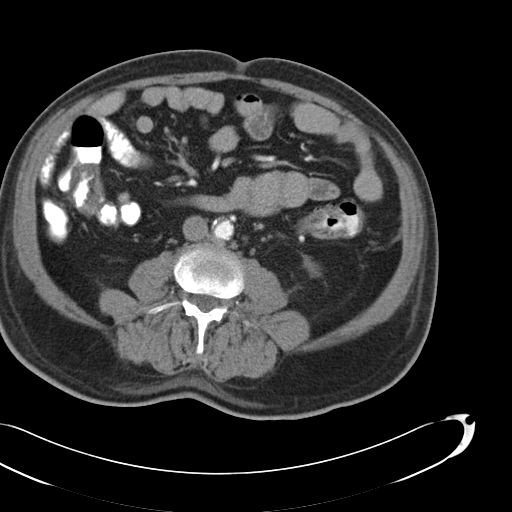
[im 60/94  soft-tissue]
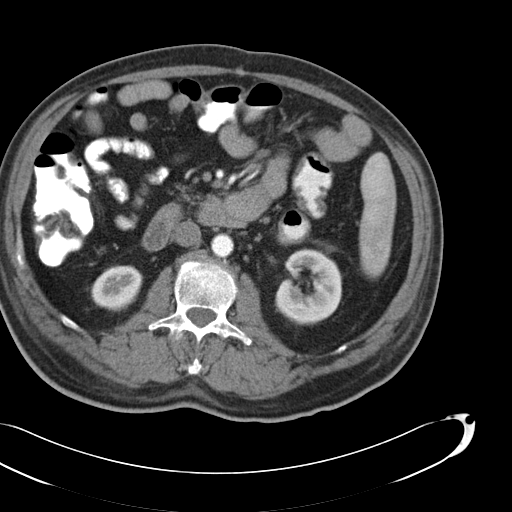
[im 60/94  bone]
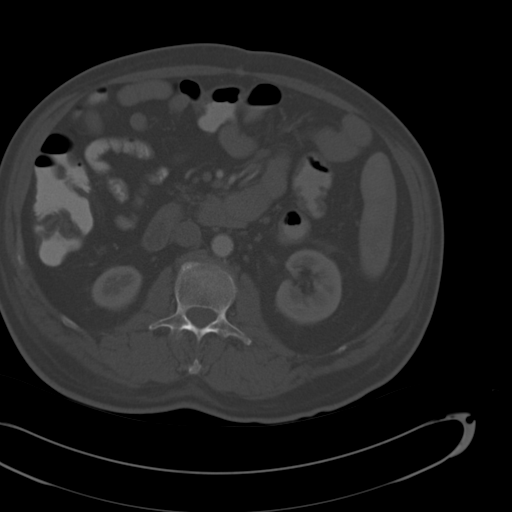
[im 67/94  soft-tissue]
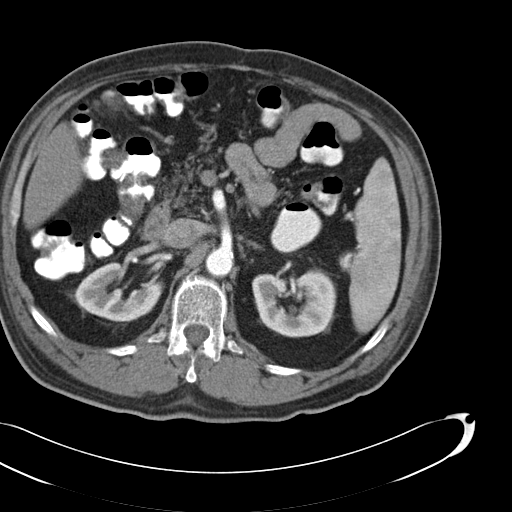
[im 75/94  soft-tissue]
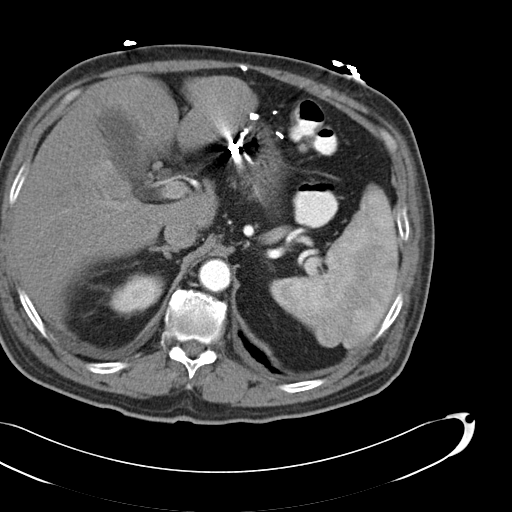
[im 79/94  lung]
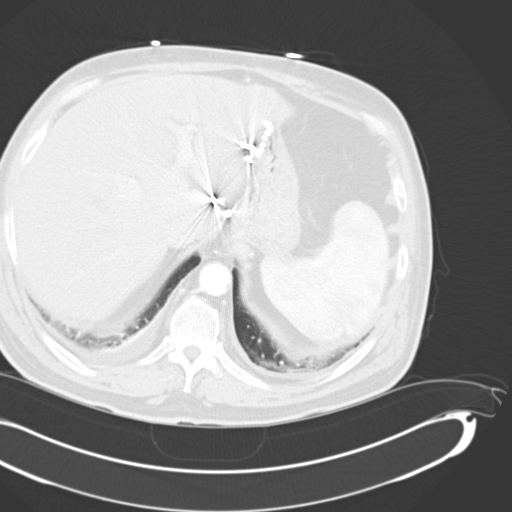
[im 82/94  soft-tissue]
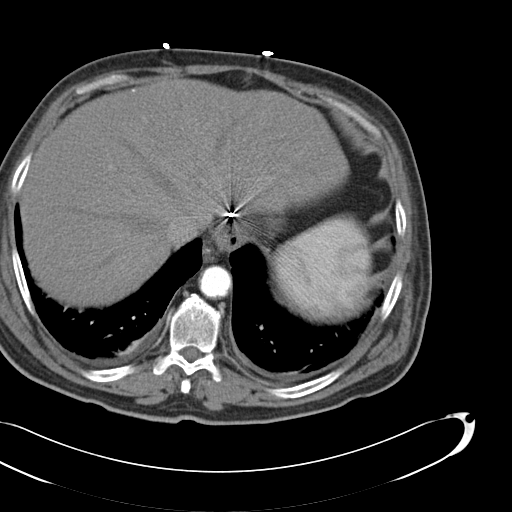
[im 82/94  lung]
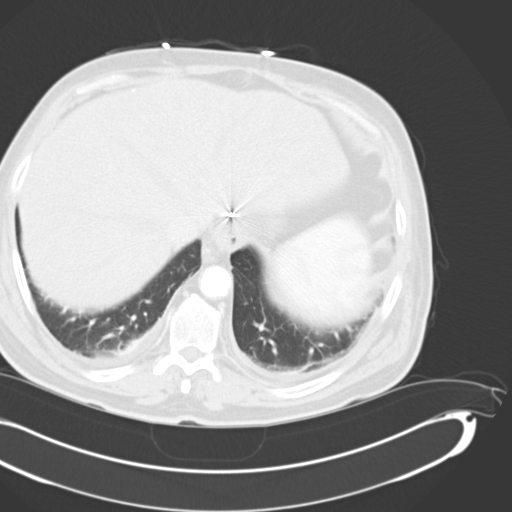
[im 86/94  lung]
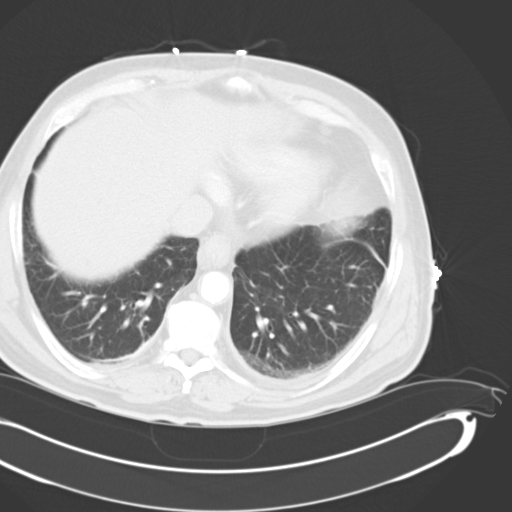
[im 90/94  soft-tissue]
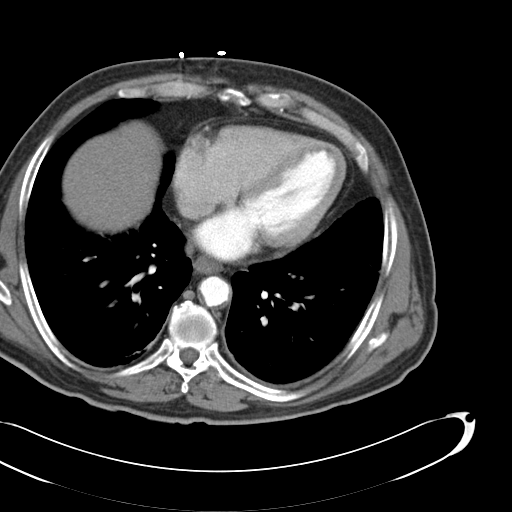
[im 90/94  lung]
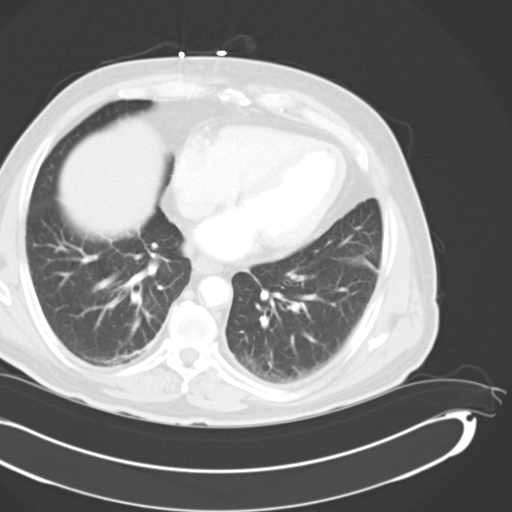

[15 of 32 positions shown; findings below may reference images not displayed]

FINDINGS: Limited evaluation of the lung bases shows tiny right pleural effusion with
minimal bibasilar atelectasis.

The liver, spleen, pancreas, adrenal glands, and kidneys are unremarkable.
There is mild distention of the gallbladder.

Surgical sutures are seen involving the stomach along with multiple adjacent
surgical clips. Distal colonic diverticulosis is noted. There is no bowel
dilatation or definite bowel wall thickening. Previously noted ascending and
transverse colonic wall thickening is likely due to pseudo thickening with
underdistention. The appendix is not definitely seen. There is no
significant intra abdominal/pelvic fat stranding. There is no
intraperitoneal free air. There is no significant free fluid. There are no
enlarged abdominal pelvic lymph nodes. Few metallic beads are seen involving
the prostate gland. Diffuse lower abdominal/pelvic body wall edema is noted.

Osteopenia. Bridging osteophytes involving the right sacroiliac joint.
IMPRESSION: 1. There is no bowel obstruction. There is no definite bowel wall
thickening. Previously noted ascending and transverse colonic wall
thickening is likely due to pseudo thickening with underdistention. No
significant intra-abdominal or pelvic fat stranding. No evidence of an
abscess.

2. Colonic diverticulosis.

3. Diffuse lower abdominal/pelvic body wall edema.

4. Tiny right pleural effusion.

## 2007-12-04 ENCOUNTER — Ambulatory Visit: Payer: Self-pay | Admitting: Unknown Physician Specialty

## 2011-03-19 ENCOUNTER — Emergency Department: Payer: Self-pay | Admitting: Internal Medicine

## 2011-03-19 IMAGING — CR DG CHEST 2V
1 series · 2 of 2 positions shown · non-contrast
Comparison: none

REASON FOR EXAM: cough
COMMENTS:   LMP: (Male)

PROCEDURE:     DXR - DXR CHEST PA (OR AP) AND LATERAL  - [DATE]  [DATE]
RESULT:     Mild right base atelectasis versus pneumonia noted. The left
lung is clear. Cardiovascular structures are unremarkable.

[Series 1: pa · 0.17mm/px · 2 of 2 slices shown]
[im 1/2]
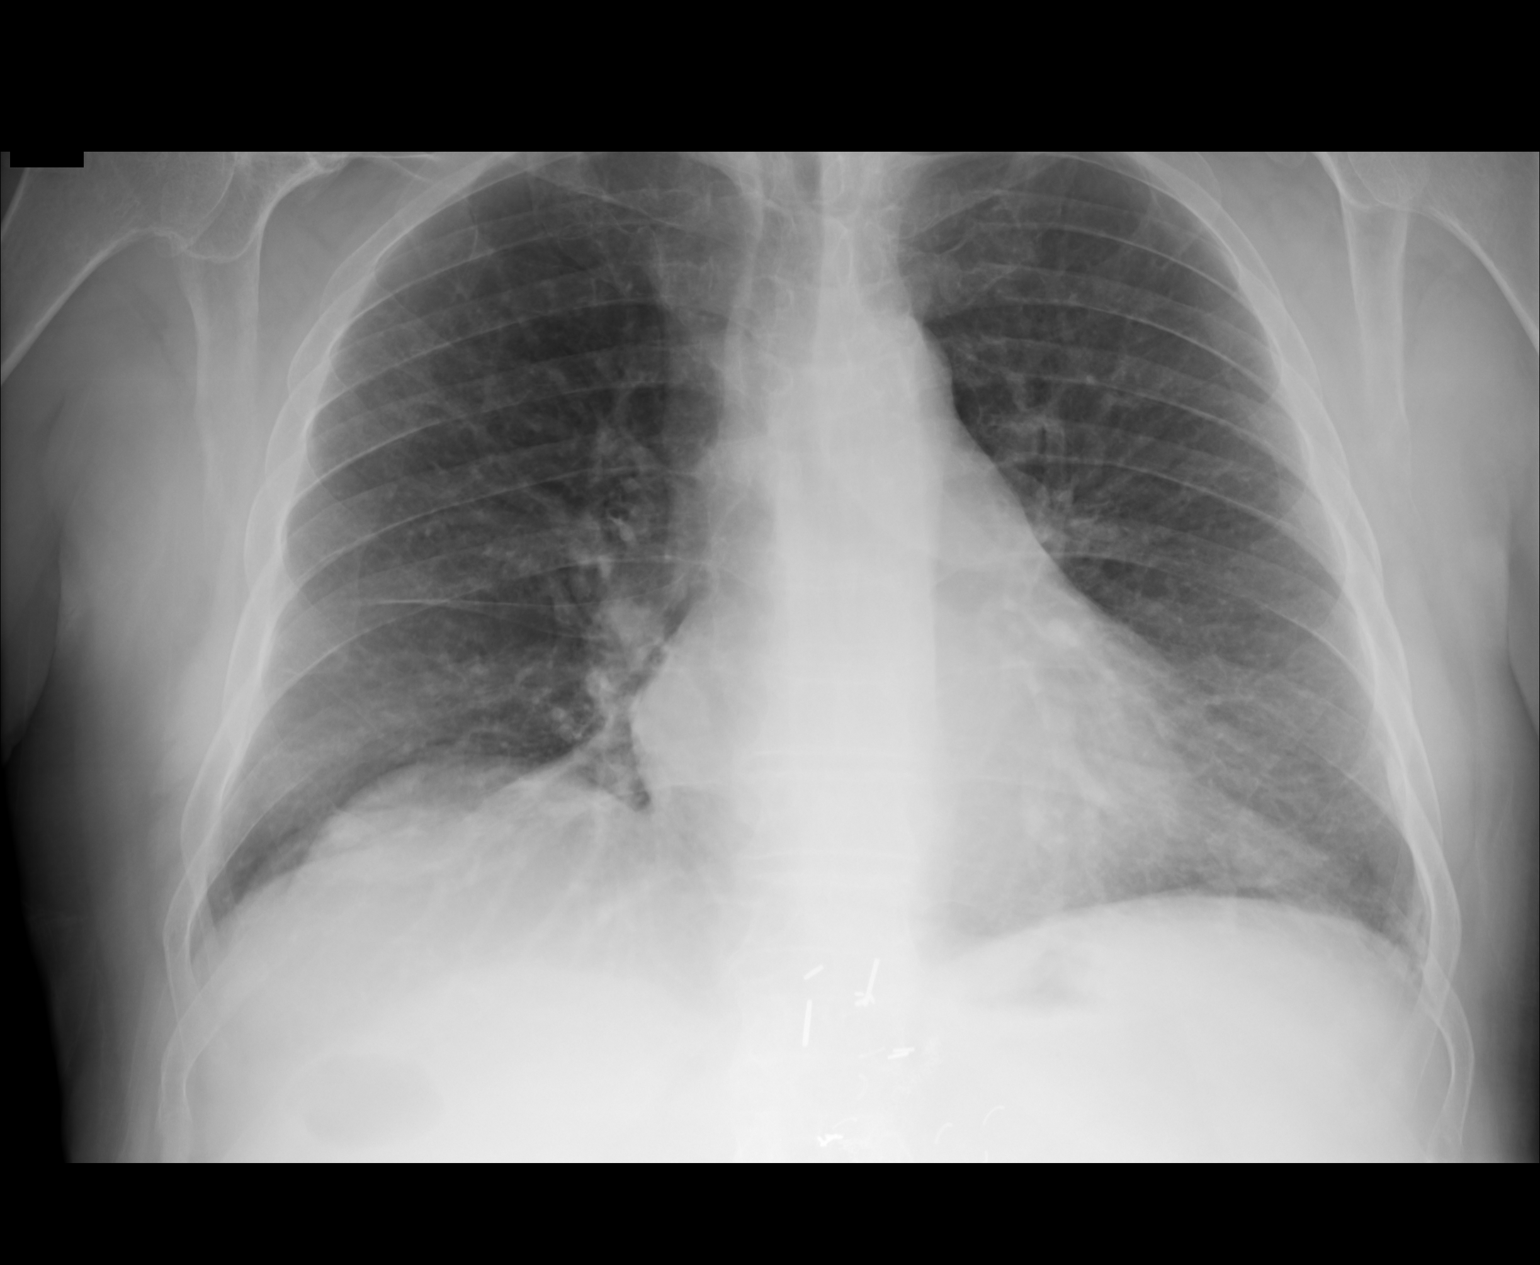
[im 2/2]
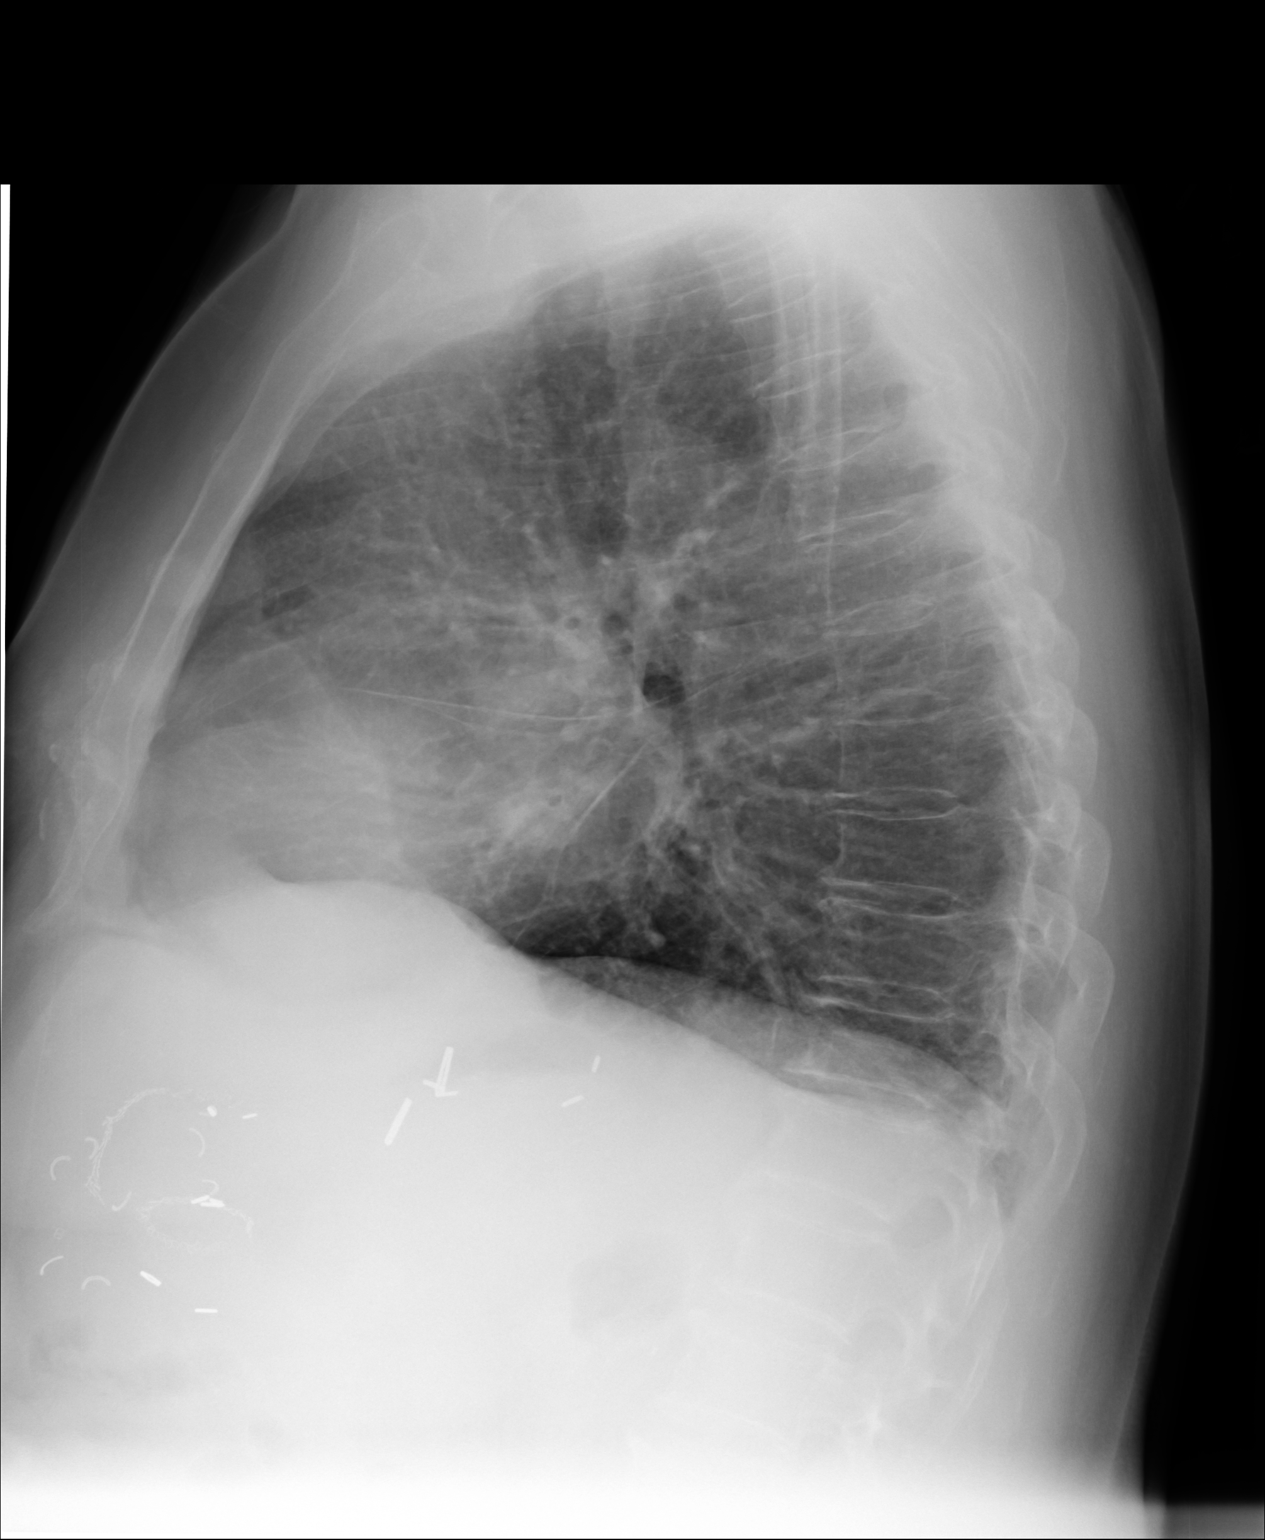

[2 of 2 positions shown; findings below may reference images not displayed]

IMPRESSION: Mild right base atelectasis versus pneumonia. Also noted
are surgical clips in the upper abdomen.

## 2011-04-10 ENCOUNTER — Inpatient Hospital Stay: Payer: Self-pay | Admitting: Internal Medicine

## 2011-04-10 DIAGNOSIS — K920 Hematemesis: Secondary | ICD-10-CM

## 2011-04-10 HISTORY — DX: Hematemesis: K92.0

## 2011-04-10 IMAGING — CR DG CHEST 1V PORT
1 series · 1 of 1 positions shown · non-contrast
Comparison: none

REASON FOR EXAM: UPRIGHT; VOMITING BLOOD
COMMENTS:

PROCEDURE:     DXR - DXR PORTABLE CHEST SINGLE VIEW  - [DATE] [DATE]
RESULT:     Comparison: [DATE]

[portable]
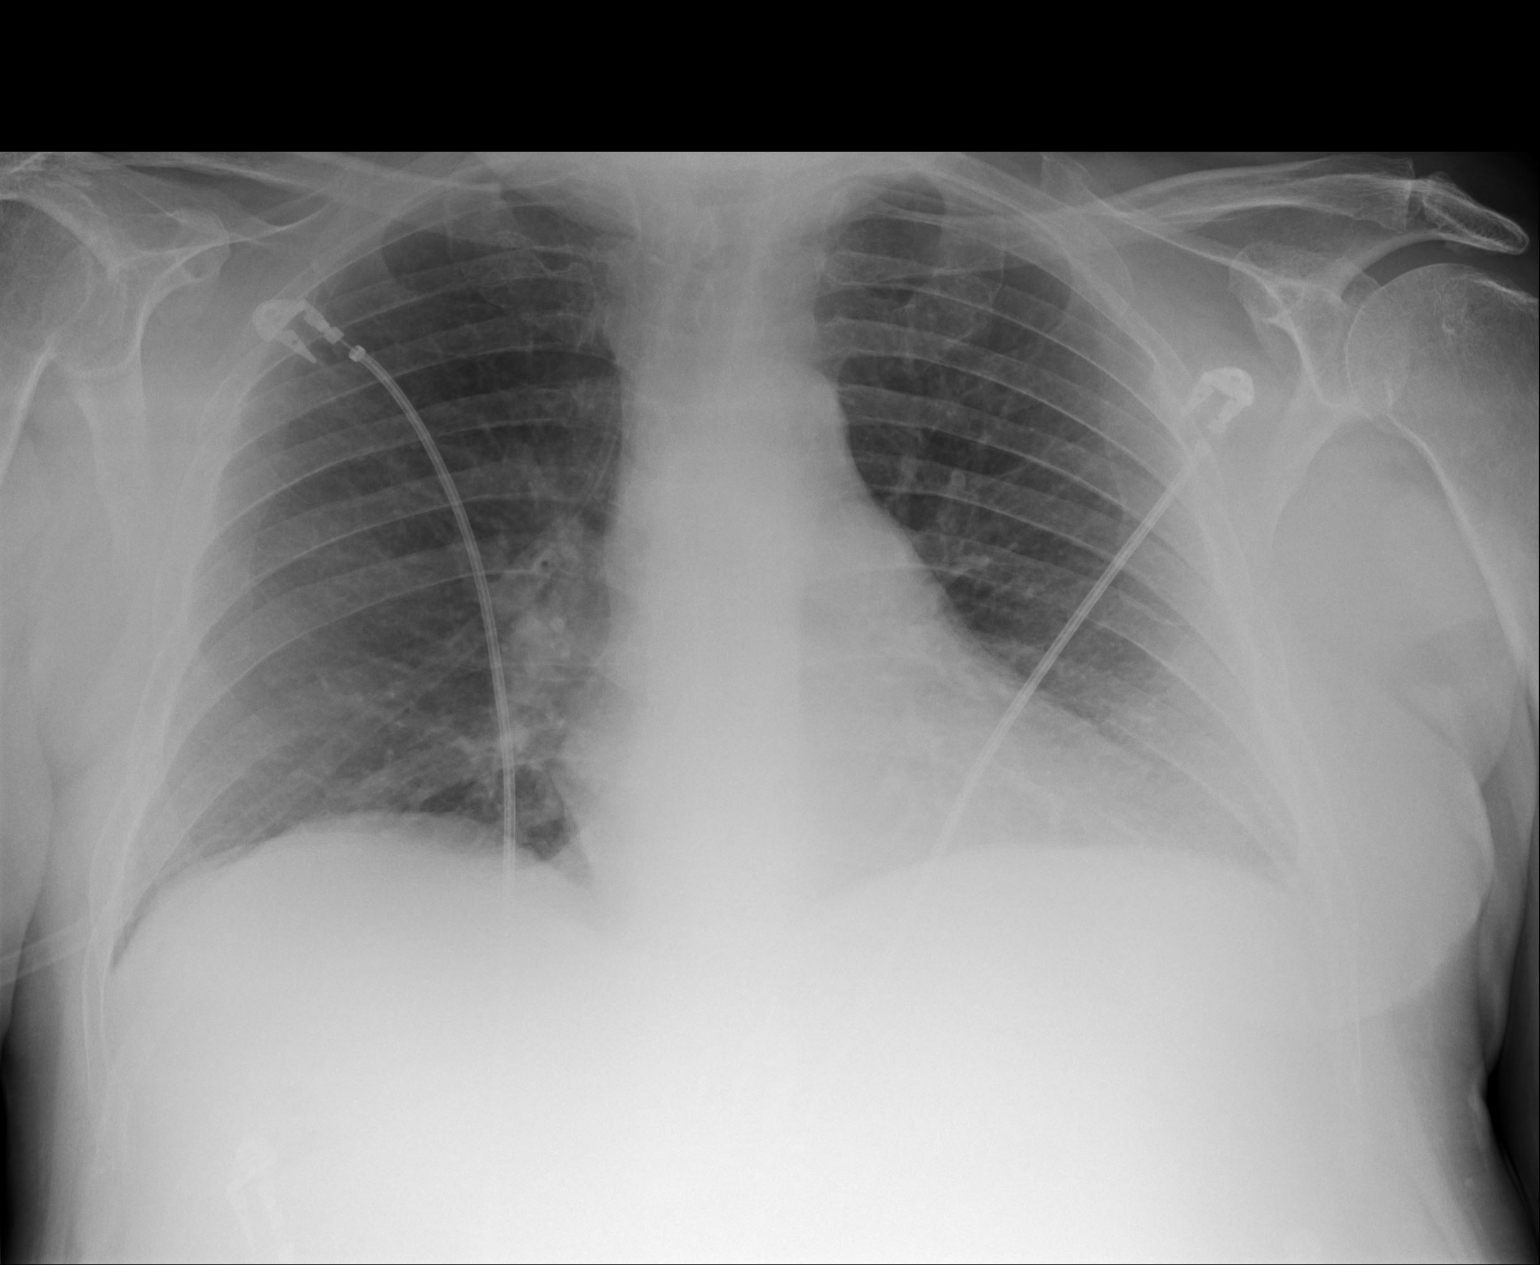

[1 of 1 positions shown; findings below may reference images not displayed]

FINDINGS: Single portable AP chest radiograph is provided.  There is no focal
parenchymal opacity, pleural effusion, or pneumothorax. Normal
cardiomediastinal silhouette. The osseous structures are unremarkable.
IMPRESSION: No acute disease of the chest.

## 2011-04-12 LAB — CBC WITH DIFFERENTIAL/PLATELET
Basophil #: 0 10*3/uL (ref 0.0–0.1)
Basophil %: 0.4 %
Eosinophil #: 0 10*3/uL (ref 0.0–0.7)
HCT: 26.4 % — ABNORMAL LOW (ref 40.0–52.0)
Lymphocyte %: 4.6 %
MCHC: 34.4 g/dL (ref 32.0–36.0)
Monocyte %: 0.3 %
Neutrophil %: 94.6 %
Platelet: 110 10*3/uL — ABNORMAL LOW (ref 150–440)
RDW: 13.7 % (ref 11.5–14.5)
WBC: 9.2 10*3/uL (ref 3.8–10.6)

## 2011-04-15 LAB — HEMOGLOBIN: HGB: 9.6 g/dL — ABNORMAL LOW (ref 13.0–18.0)

## 2011-04-21 ENCOUNTER — Institutional Professional Consult (permissible substitution): Payer: Self-pay | Admitting: Pulmonary Disease

## 2011-04-22 ENCOUNTER — Encounter: Payer: Self-pay | Admitting: Pulmonary Disease

## 2011-04-25 ENCOUNTER — Ambulatory Visit (INDEPENDENT_AMBULATORY_CARE_PROVIDER_SITE_OTHER): Payer: Medicare Other | Admitting: Pulmonary Disease

## 2011-04-25 ENCOUNTER — Encounter: Payer: Self-pay | Admitting: Pulmonary Disease

## 2011-04-25 DIAGNOSIS — R059 Cough, unspecified: Secondary | ICD-10-CM

## 2011-04-25 DIAGNOSIS — R609 Edema, unspecified: Secondary | ICD-10-CM

## 2011-04-25 DIAGNOSIS — R000 Tachycardia, unspecified: Secondary | ICD-10-CM

## 2011-04-25 DIAGNOSIS — I2510 Atherosclerotic heart disease of native coronary artery without angina pectoris: Secondary | ICD-10-CM

## 2011-04-25 DIAGNOSIS — R600 Localized edema: Secondary | ICD-10-CM

## 2011-04-25 DIAGNOSIS — I509 Heart failure, unspecified: Secondary | ICD-10-CM

## 2011-04-25 DIAGNOSIS — E7800 Pure hypercholesterolemia, unspecified: Secondary | ICD-10-CM

## 2011-04-25 DIAGNOSIS — R05 Cough: Secondary | ICD-10-CM

## 2011-04-25 DIAGNOSIS — J449 Chronic obstructive pulmonary disease, unspecified: Secondary | ICD-10-CM | POA: Insufficient documentation

## 2011-04-25 DIAGNOSIS — I10 Essential (primary) hypertension: Secondary | ICD-10-CM

## 2011-04-25 NOTE — Progress Notes (Signed)
Subjective:    Patient ID: Matthew Miles, male    DOB: 01-01-1929, 78 y.o.   MRN: [id number]  HPI This is a very pleasant 78 y/o male with newly diagnosed COPD who comes to our office for evaluation of the same.  He was a heavy smoker up until a hospitalization in late December for a Upper GI bleed.  During that time he was found to be short of breath and wheezing.  The team there felt he likely had COPD so he was started on spiriva, advair, and albuterol.   He notes several years of progressive dyspnea on exertion.  He was very active up until two years ago when he had to assume full time care of his wife.  Since then he has become so dyspnic that he has stop walking after just a few hundred feet.  He states that he has a chronic daily cough productive of sputum.  He recently quit smoking. He has chronic leg edema which was better in the hospital but has gotten worse at home.  He denies chest pain but notes a frequent change in his heart rate (one minute it is 120, the next it is 80).  Past Medical History  Diagnosis Date  . COPD (chronic obstructive pulmonary disease)   . Smoker   . Mild hypertension   . Hypercholesterolemia   . Upper GI bleed 1980  . Hematemesis/vomiting blood 04/10/11  . Prostate cancer      Family History  Problem Relation Age of Onset  . Emphysema Sister     smoker  . Prostate cancer Father   . Liver cancer Brother   . Prostate cancer Brother      History   Social History  . Marital Status: Married    Spouse Name: N/A    Number of Children: 2  . Years of Education: N/A   Occupational History  . 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     Social History Main Topics  . Smoking status: Former Smoker -- 1.0 packs/day for 65 years    Types: Cigarettes    Quit date: 04/10/2011  . Smokeless tobacco: Former Neurosurgeon    Types: Chew  . Alcohol Use: No  . Drug Use: No  . Sexually Active: Not on file   Other Topics Concern  . Not on file   Social History Narrative    . No narrative on file     Allergies  Allergen Reactions  . Sulfa Antibiotics     GI upset     No outpatient prescriptions prior to visit.       Review of Systems  Constitutional: Negative for fever, chills, activity change, appetite change and unexpected weight change.  HENT: Negative for congestion, sore throat, rhinorrhea, sneezing, trouble swallowing, dental problem, voice change and postnasal drip.   Eyes: Negative for visual disturbance.  Respiratory: Positive for shortness of breath. Negative for cough and choking.   Cardiovascular: Positive for leg swelling. Negative for chest pain.  Gastrointestinal: Negative for nausea, vomiting and abdominal pain.  Genitourinary: Negative for difficulty urinating.  Musculoskeletal: Negative for arthralgias.  Skin: Negative for rash.  Psychiatric/Behavioral: Negative for behavioral problems and confusion.       Objective:   Physical Exam Filed Vitals:   04/25/11 1354  BP: 104/54  Pulse: 125  Temp: 98.2 F (36.8 C)  TempSrc: Oral  Height: 5\' 2"  (1.575 m)  Weight: 83.462 kg (184 lb)  SpO2: 96%   Gen: well  appearing, no acute distress HEENT: NCAT, PERRL, EOMi, OP clear, neck supple without masses PULM: Exp wheezing noted bilaterally CV: Tachy but regular, systolic murmurs at base and apex noted, no JVD appreciated AB: BS+, soft, nontender, no hsm Ext: warm, pitting edema in ankles noted, no clubbing, no cyanosis Derm: no rash or skin breakdown Neuro: A&Ox4, CN II-XII intact, strength 5/5 in all 4 extremities       Assessment & Plan:   COPD (chronic obstructive pulmonary disease) Mr. Conant appears to have COPD based on his daily sputum production and worsening dyspnea over the last two years in the setting of long standing tobacco use.  He does not have PFT's on record so we will send him for those at [facility] so that he can have full PFT's with DLCO and volumes.  I think that his dyspnea on exertion is likely  multifactorial from COPD, likely heart failure, and deconditioning.  Plan: -continue current albuterol prn, spiriva daily and advair bid -advised that he doesn't need to use the albuterol nebulizer scheduled as he is doing -continue to abstain from smoking -no indication for O2 with exertion -flu and pneumonia shot up date -PFT's ordered -would like to refer for pulmonary rehab, but he needs to see a cardiologist first before starting a regular exercise routine as I believe that he has heart failure and he notes frequent changes in his heart rate.  Peripheral edema Volume up on my exam today, this sounds somewhat chronic according to him and his daughter.  Plan: -refer to cardiology (Dr. Mariah Milling) for 2D TTE -if OK by cardiology I would like to refer to pulmonary rehab  Tachycardia Appears to be in sinus tachycardia today, he notes frequent heart rate changes at home.  Unclear etiology.  Plan: -continue diltiazem -refer to cardiology  Cough Due to COPD but likely made worse by chronic allergic rhinitis.  Recently started on all inhalers and recently completed steroid taper, may not have been on inhalers long enough to see full benefit.  Plan: -continue inhalers as written -add delsym prn cough - if no improvement on next visit Nasonex and sinus rinses    Updated Medication List Outpatient Encounter Prescriptions as of 04/25/2011  Medication Sig Dispense Refill  . albuterol (PROVENTIL) (2.5 MG/3ML) 0.083% nebulizer solution Take 2.5 mg by nebulization every 6 (six) hours as needed.      Marland Kitchen albuterol (VENTOLIN HFA) 108 (90 BASE) MCG/ACT inhaler Inhale 2 puffs into the lungs every 4 (four) hours as needed.      . diltiazem (CARDIZEM) 120 MG tablet Take 120 mg by mouth daily.      . ferrous fumarate (HEMOCYTE - 106 MG FE) 325 (106 FE) MG TABS Take 1 tablet by mouth 2 (two) times daily.      . Fluticasone-Salmeterol (ADVAIR DISKUS) 250-50 MCG/DOSE AEPB Inhale 1 puff into the lungs  every 12 (twelve) hours.      . nicotine (NICODERM CQ - DOSED IN MG/24 HR) 7 mg/24hr patch Place 1 patch onto the skin daily.      Marland Kitchen omeprazole (PRILOSEC) 20 MG capsule Take 20 mg by mouth. Twice daily before meals      . tiotropium (SPIRIVA) 18 MCG inhalation capsule Place 18 mcg into inhaler and inhale daily.

## 2011-04-25 NOTE — Patient Instructions (Signed)
Continue taking the spiriva, advair, and ventolin as you are doing. You only need to use the albuterol nebulizer every 6 hours as needed, not twice a day. We will refer you to Dr. Dossie Arbour for an ultrasound of your heart and an office visit because of your coronary disease, leaking valve (heart murmur), fast heart rate, and swelling. Take Delsym 10mL every 12 hours as needed for cough. After you have seen Dr. Mariah Milling we will refer you to pulmonary rehab. We will see you back in one month.

## 2011-04-26 DIAGNOSIS — R05 Cough: Secondary | ICD-10-CM | POA: Insufficient documentation

## 2011-04-26 DIAGNOSIS — R609 Edema, unspecified: Secondary | ICD-10-CM | POA: Insufficient documentation

## 2011-04-26 DIAGNOSIS — R000 Tachycardia, unspecified: Secondary | ICD-10-CM | POA: Insufficient documentation

## 2011-04-26 DIAGNOSIS — I25118 Atherosclerotic heart disease of native coronary artery with other forms of angina pectoris: Secondary | ICD-10-CM | POA: Insufficient documentation

## 2011-04-26 NOTE — Assessment & Plan Note (Signed)
Matthew Miles appears to have COPD based on his daily sputum production and worsening dyspnea over the last two years in the setting of long standing tobacco use.  He does not have PFT's on record so we will send him for those at [facility] so that he can have full PFT's with DLCO and volumes.  I think that his dyspnea on exertion is likely multifactorial from COPD, likely heart failure, and deconditioning.  Plan: -continue current albuterol prn, spiriva daily and advair bid -advised that he doesn't need to use the albuterol nebulizer scheduled as he is doing -continue to abstain from smoking -no indication for O2 with exertion -flu and pneumonia shot up date -PFT's ordered -would like to refer for pulmonary rehab, but he needs to see a cardiologist first before starting a regular exercise routine as I believe that he has heart failure and he notes frequent changes in his heart rate.

## 2011-04-26 NOTE — Assessment & Plan Note (Signed)
Appears to be in sinus tachycardia today, he notes frequent heart rate changes at home.  Unclear etiology.  Plan: -continue diltiazem -refer to cardiology

## 2011-04-26 NOTE — Assessment & Plan Note (Addendum)
Due to COPD but likely made worse by chronic allergic rhinitis.  Recently started on all inhalers and recently completed steroid taper, may not have been on inhalers long enough to see full benefit.  Plan: -continue inhalers as written -add delsym prn cough - if no improvement on next visit Nasonex and sinus rinses

## 2011-04-26 NOTE — Assessment & Plan Note (Signed)
Volume up on my exam today, this sounds somewhat chronic according to him and his daughter.  Plan: -refer to cardiology (Dr. Mariah Milling) for 2D TTE -if OK by cardiology I would like to refer to pulmonary rehab

## 2011-04-28 ENCOUNTER — Telehealth: Payer: Self-pay | Admitting: Pulmonary Disease

## 2011-04-28 NOTE — Telephone Encounter (Signed)
Called and spoke with pt's daughter, Britta Mccreedy.  Britta Mccreedy stated pt was seen by Dr. Kendrick Fries on 04/25/11 and she just needed clarification on how pt is use his albuterol nebs and hfa. Informed her, per the pt instructions, Dr. Kendrick Fries recommended albuterol nebs and ventolin be used every 6 hours only as needed, not bid as pt was doing previously.  Daughter verbalized understanding and stated she would relay the message to pt.   Daughter also stated she spoke with Angelique Blonder from [facility] [place] to get general information regarding hospice.  Britta Mccreedy is wanting to know if Dr. Kendrick Fries will ok order for hospice for pt.   Please advise.  Thanks!   FYIAngelique Blonder with Hospice phone # is [phone]  And fax # is [phone]

## 2011-04-29 NOTE — Telephone Encounter (Signed)
I am surprised by the request for hospice.  I have only seen the patient once so I would encourage them to discuss hospice care with his primary care physician.

## 2011-05-02 NOTE — Telephone Encounter (Signed)
Spoke with pt daughter and advised. Carron Curie, CMA

## 2011-05-03 ENCOUNTER — Ambulatory Visit: Payer: Self-pay | Admitting: Pulmonary Disease

## 2011-05-03 LAB — PULMONARY FUNCTION TEST

## 2011-05-09 ENCOUNTER — Ambulatory Visit: Payer: Medicare Other | Admitting: Cardiovascular Disease

## 2011-05-10 ENCOUNTER — Ambulatory Visit (INDEPENDENT_AMBULATORY_CARE_PROVIDER_SITE_OTHER): Payer: Medicare Other | Admitting: Cardiovascular Disease

## 2011-05-10 ENCOUNTER — Encounter: Payer: Self-pay | Admitting: Cardiovascular Disease

## 2011-05-10 DIAGNOSIS — I2510 Atherosclerotic heart disease of native coronary artery without angina pectoris: Secondary | ICD-10-CM

## 2011-05-10 DIAGNOSIS — R000 Tachycardia, unspecified: Secondary | ICD-10-CM

## 2011-05-10 DIAGNOSIS — J449 Chronic obstructive pulmonary disease, unspecified: Secondary | ICD-10-CM

## 2011-05-10 DIAGNOSIS — I10 Essential (primary) hypertension: Secondary | ICD-10-CM

## 2011-05-10 DIAGNOSIS — R079 Chest pain, unspecified: Secondary | ICD-10-CM | POA: Insufficient documentation

## 2011-05-10 DIAGNOSIS — E7800 Pure hypercholesterolemia, unspecified: Secondary | ICD-10-CM

## 2011-05-10 DIAGNOSIS — R609 Edema, unspecified: Secondary | ICD-10-CM

## 2011-05-10 DIAGNOSIS — R0602 Shortness of breath: Secondary | ICD-10-CM

## 2011-05-10 NOTE — Assessment & Plan Note (Signed)
We will hold the diltiazem but will start bystolic . We have asked him to closely monitor his blood pressure at home as well as heart rate.

## 2011-05-10 NOTE — Assessment & Plan Note (Signed)
Currently on pravastatin. Goal LDL less than 70.

## 2011-05-10 NOTE — Assessment & Plan Note (Signed)
I'm concerned about recent episodes of chest pain given his history of CAD. Cardiac catheterization report unavailable though notes from Dr. Artis Flock indicate total LAD occlusion. We have discussed this with him and given that he is high risk for worsening coronary artery disease, we will repeat his cardiac catheterization. This will be scheduled at his convenience in the next week or so.

## 2011-05-10 NOTE — Assessment & Plan Note (Signed)
His edema is impressive. Uncertain if this is secondary to pulmonary hypertension, possibly secondary to venous insufficiency with exacerbation from his calcium channel blocker. We have ordered an echocardiogram to evaluate his right ventricular systolic pressures. We'll hold his diltiazem and start bystolic 20 mg q.a.m.. I have suggested to him that if he has any worsening shortness of breath or symptoms of bronchospasm that he call our office.

## 2011-05-10 NOTE — Assessment & Plan Note (Signed)
He continues to have tachycardia and palpitations despite diltiazem. Diltiazem may not be the best choice for him given his worsening lower extremity edema, itching, swelling and discomfort. We will hold the diltiazem as mentioned and change to bystolic in the hope that this does not exacerbate his COPD.

## 2011-05-10 NOTE — Progress Notes (Signed)
Patient ID: Matthew Miles, male    DOB: 22-Aug-1928, 78 y.o.   MRN: [id number]  HPI Comments: Matthew Miles is a 78 year old gentleman with history of coronary artery disease, previous MI and catheterization possibly 10 years ago though the results are not available to [place] at this time. Long history of smoking for 60 years, recent bronchitis requiring several courses of antibiotics at the end of 2012, prednisone and nebulizers with admission to the hospital in December for hematemesis and gastric ulcer seen on EGD, found to have COPD and oxygenation down to 83% with ambulation, nonsustained VT per the notes, acute renal failure who presents by referral from Dr. Kendrick Miles for his cardiac issues.  He reports that he has had worsening shortness of breath and chest pain over the past several weeks to months. He has not had any followup with cardiology over the past several years. He is uncertain if his chest pain is from his heart or lungs. He denies any recent cough and reports his bronchitis has resolved. He has had recent chest tightness with exertion and does not work in his garden anymore he used to. He did not have an echocardiogram in the hospital. He reports having shortness of breath after his EGD was performed. Uncertain if he aspirated during the procedure.   He reports having worsening lower extremity edema. He was started on diltiazem 240 mg daily in the hospital in January and by mistake he was taking 2 tabs per day. Recently he has been taking one tab per day. Swelling has got so bad that he has pain and itching of his legs. He is currently not using oxygen as his lungs have improved. He does report a cough recently as his edema has and getting worse.  He has stopped smoking for 5 weeks. EKG shows sinus tachycardia with rate 100 beats per minute, frequent APCs, poor R-wave progression through the anterior precordial leads, ST and T wave abnormality in leads one and aVL, left axis deviation/left  anterior fascicular block   Outpatient Encounter Prescriptions as of 05/10/2011  Medication Sig Dispense Refill  . albuterol (PROVENTIL) (2.5 MG/3ML) 0.083% nebulizer solution Take 2.5 mg by nebulization every 6 (six) hours as needed.      Marland Kitchen albuterol (VENTOLIN HFA) 108 (90 BASE) MCG/ACT inhaler Inhale 2 puffs into the lungs every 4 (four) hours as needed.      . diltiazem (CARDIZEM CD) 240 MG 24 hr capsule Take 240 mg by mouth daily.      . ferrous fumarate (HEMOCYTE - 106 MG FE) 325 (106 FE) MG TABS Take 1 tablet by mouth 2 (two) times daily.      . Fluticasone-Salmeterol (ADVAIR DISKUS) 250-50 MCG/DOSE AEPB Inhale 1 puff into the lungs every 12 (twelve) hours.      . nicotine (NICODERM CQ - DOSED IN MG/24 HR) 7 mg/24hr patch Place 1 patch onto the skin daily.      Marland Kitchen omeprazole (PRILOSEC) 20 MG capsule Take 20 mg by mouth. Twice daily before meals      . pravastatin (PRAVACHOL) 40 MG tablet Take 40 mg by mouth daily.      Marland Kitchen tiotropium (SPIRIVA) 18 MCG inhalation capsule Place 18 mcg into inhaler and inhale daily.         Review of Systems  Constitutional: Negative.   HENT: Negative.   Eyes: Negative.   Respiratory: Positive for cough and shortness of breath.   Cardiovascular: Positive for chest pain, palpitations and leg swelling.  Gastrointestinal:  Negative.   Musculoskeletal: Negative.   Skin: Negative.   Neurological: Negative.   Hematological: Negative.   Psychiatric/Behavioral: Negative.   All other systems reviewed and are negative.    BP 149/69  Pulse 99  Ht 5\' 3"  (1.6 m)  Wt 191 lb (86.637 kg)  BMI 33.83 kg/m2  Physical Exam  Nursing note and vitals reviewed. Constitutional: He is oriented to person, place, and time. He appears well-developed and well-nourished.  HENT:  Head: Normocephalic.  Nose: Nose normal.  Mouth/Throat: Oropharynx is clear and moist.  Eyes: Conjunctivae are normal. Pupils are equal, round, and reactive to light.  Neck: Normal range of  motion. Neck supple. No JVD present.  Cardiovascular: Normal rate, regular rhythm, S1 normal, S2 normal and intact distal pulses.  Frequent extrasystoles are present. Exam reveals no gallop and no friction rub.   Murmur heard.  Systolic murmur is present with a grade of 2/6       2+ pitting edema to below the knees  Pulmonary/Chest: Effort normal and breath sounds normal. No respiratory distress. He has no wheezes. He has no rales. He exhibits no tenderness.  Abdominal: Soft. Bowel sounds are normal. He exhibits no distension. There is no tenderness.  Musculoskeletal: Normal range of motion. He exhibits no edema and no tenderness.  Lymphadenopathy:    He has no cervical adenopathy.  Neurological: He is alert and oriented to person, place, and time. Coordination normal.  Skin: Skin is warm and dry. No rash noted. No erythema.  Psychiatric: He has a normal mood and affect. His behavior is normal. Judgment and thought content normal.           Assessment and Plan

## 2011-05-10 NOTE — Assessment & Plan Note (Addendum)
He has been seen by Dr. Kendrick Fries and reports his inhaler regimen to has improved his breathing but he continues to have significant trouble with any exertion. Unable to exclude cardiac etiology. I would probably hold off on starting pulmonary rehabilitation until after the catheterization and echocardiogram.

## 2011-05-10 NOTE — Patient Instructions (Addendum)
Stop the cardizem/diltiazem (to see if leg swelling gets better) Please start bystolic two pills in the AM  We will schedule an echo for Feb 5th to look at the heart function We will schedule a cardiac cath on Feb. 13th at 10:30 AM (need to be there at 9:30)  Please call us if you have new issues that need to be addressed before your next appt.  Your physician wants you to follow-up in: 1 months.  You will receive a reminder letter in the mail two months in advance. If you don't receive a letter, please call our office to schedule the follow-up appointment.

## 2011-05-11 ENCOUNTER — Encounter: Payer: Self-pay | Admitting: *Deleted

## 2011-05-16 ENCOUNTER — Telehealth: Payer: Self-pay | Admitting: Cardiovascular Disease

## 2011-05-16 ENCOUNTER — Telehealth: Payer: Self-pay | Admitting: *Deleted

## 2011-05-16 NOTE — Telephone Encounter (Signed)
Pt daughter calling stating that she thinks that pt needs to possibly stop one of his BP meds. Bp is running lower that normal. 116/50. Also pt daughter would like to know if pt is using chewing tobacco does he still need to wear nicotine patch. Pt trying to stop smoking

## 2011-05-16 NOTE — Telephone Encounter (Signed)
05/16/11--pts daughter calling stating Matthew Miles was switched to bystolic(40mg  qam)  for his BP, and she feels he is taking too much--she states his heart rate is elevated and his BP is down to 116/54--advised to cut dose by 1/2 --pt to take 20mg  qam until he sees dr Mariah Milling on Friday 2/8 for pre visit for c cath--advised to keep record of BP readings to take to dr gollan--pt's daughter agrees--nt

## 2011-05-17 ENCOUNTER — Other Ambulatory Visit (INDEPENDENT_AMBULATORY_CARE_PROVIDER_SITE_OTHER): Payer: Medicare Other | Admitting: *Deleted

## 2011-05-17 ENCOUNTER — Telehealth: Payer: Self-pay | Admitting: Pulmonary Disease

## 2011-05-17 DIAGNOSIS — I2510 Atherosclerotic heart disease of native coronary artery without angina pectoris: Secondary | ICD-10-CM

## 2011-05-17 DIAGNOSIS — R0602 Shortness of breath: Secondary | ICD-10-CM

## 2011-05-17 DIAGNOSIS — R0989 Other specified symptoms and signs involving the circulatory and respiratory systems: Secondary | ICD-10-CM

## 2011-05-17 DIAGNOSIS — I369 Nonrheumatic tricuspid valve disorder, unspecified: Secondary | ICD-10-CM

## 2011-05-17 NOTE — Telephone Encounter (Signed)
Per last ov with Dr.McQuaid, pt was to followup after he was seen by cards. He was seen by Dr. Mariah Milling on 05/10/11. The appt was not scheduled with Dr. Kendrick Fries until 06/13/11- I called to see if he would like to come in this wk, LMTCB.

## 2011-05-18 NOTE — Telephone Encounter (Signed)
Spoke with pt and asked if wanted to come in sooner for ov- ? This wk. He states that he has way too much going on this month with cards appts and prefers that he keep the planned appt here in March. I advised that this is fine, just to call sooner if he changes his mind.

## 2011-05-19 ENCOUNTER — Telehealth: Payer: Self-pay | Admitting: Cardiovascular Disease

## 2011-05-19 ENCOUNTER — Other Ambulatory Visit: Payer: Self-pay

## 2011-05-19 DIAGNOSIS — Z01818 Encounter for other preprocedural examination: Secondary | ICD-10-CM

## 2011-05-19 DIAGNOSIS — Z5181 Encounter for therapeutic drug level monitoring: Secondary | ICD-10-CM

## 2011-05-19 DIAGNOSIS — I2510 Atherosclerotic heart disease of native coronary artery without angina pectoris: Secondary | ICD-10-CM

## 2011-05-19 DIAGNOSIS — R0602 Shortness of breath: Secondary | ICD-10-CM

## 2011-05-19 NOTE — Telephone Encounter (Signed)
Pt daughter calling states that pt can hardly walk or talk. Pt thinks it may be his BP medication. He is scheduled to have a cath on 2/13

## 2011-05-19 NOTE — Telephone Encounter (Signed)
Pt reports feeling more weak and "give out" in the morning after taking the Bystolic until around lunch time.   He does say his mobility improves by afternoon.   He feels certain this is due to his bystolic.  He has taken the medication already today. He has not had any increase in edema and states it decreases at night.   His weight today is 185.  He will not take it tomorrow morning. He does have appt for lab work at Dr. Windell Hummingbird office tomorrow morning and will report his blood pressure and symptoms if any not taking the bystolic. Pt agrees. Mylo Red RN

## 2011-05-20 ENCOUNTER — Ambulatory Visit (INDEPENDENT_AMBULATORY_CARE_PROVIDER_SITE_OTHER): Payer: Medicare Other | Admitting: *Deleted

## 2011-05-20 DIAGNOSIS — Z01818 Encounter for other preprocedural examination: Secondary | ICD-10-CM

## 2011-05-20 DIAGNOSIS — I2510 Atherosclerotic heart disease of native coronary artery without angina pectoris: Secondary | ICD-10-CM

## 2011-05-20 DIAGNOSIS — R0602 Shortness of breath: Secondary | ICD-10-CM

## 2011-05-20 DIAGNOSIS — Z5181 Encounter for therapeutic drug level monitoring: Secondary | ICD-10-CM

## 2011-05-21 LAB — BASIC METABOLIC PANEL
BUN/Creatinine Ratio: 11 (ref 10–22)
CO2: 19 mmol/L — ABNORMAL LOW (ref 20–32)
Chloride: 105 mmol/L (ref 97–108)
GFR calc Af Amer: 86 mL/min/{1.73_m2} (ref >59)
Potassium: 4.3 mmol/L (ref 3.5–5.2)
Sodium: 138 mmol/L (ref 134–144)

## 2011-05-21 LAB — CBC WITH DIFFERENTIAL/PLATELET
Basophils Absolute: 0 10*3/uL (ref 0.0–0.2)
Eosinophils Absolute: 0 10*3/uL (ref 0.0–0.4)
HCT: 34.4 % — ABNORMAL LOW (ref 37.5–51.0)
Hemoglobin: 11.3 g/dL — ABNORMAL LOW (ref 12.6–17.7)
Immature Granulocytes: 0 % (ref 0–2)
Lymphocytes Absolute: 1 10*3/uL (ref 0.7–4.5)
MCH: 29.4 pg (ref 26.6–33.0)
MCHC: 32.8 g/dL (ref 31.5–35.7)
Neutrophils Absolute: 5.1 10*3/uL (ref 1.8–7.8)
RDW: 14.2 % (ref 12.3–15.4)

## 2011-05-24 ENCOUNTER — Other Ambulatory Visit: Payer: Self-pay | Admitting: Cardiovascular Disease

## 2011-05-24 DIAGNOSIS — I2510 Atherosclerotic heart disease of native coronary artery without angina pectoris: Secondary | ICD-10-CM

## 2011-05-24 NOTE — H&P (Signed)
Matthew Miles is a 78 year old gentleman with history of coronary artery disease, previous MI and catheterization possibly 10 years ago though the results are not available to [place] at this time. Long history of smoking for 60 years, recent bronchitis requiring several courses of antibiotics at the end of 2012, prednisone and nebulizers with admission to the hospital in December for hematemesis and gastric ulcer seen on EGD, found to have COPD and oxygenation down to 83% with ambulation, nonsustained VT per the notes, acute renal failure who presents by referral from Dr. Kendrick Fries for his cardiac issues.   He reports that he has had worsening shortness of breath and chest pain over the past several weeks to months. He has not had any followup with cardiology over the past several years. He is uncertain if his chest pain is from his heart or lungs. He denies any recent cough and reports his bronchitis has resolved. He has had recent chest tightness with exertion and does not work in his garden anymore he used to. He did not have an echocardiogram in the hospital. He reports having shortness of breath after his EGD was performed. Uncertain if he aspirated during the procedure.   He reports having worsening lower extremity edema. He was started on diltiazem 240 mg daily in the hospital in January and by mistake he was taking 2 tabs per day. Recently he has been taking one tab per day. Swelling has got so bad that he has pain and itching of his legs. He is currently not using oxygen as his lungs have improved. He does report a cough recently as his edema has and getting worse.   He has stopped smoking for 5 weeks.   EKG shows sinus tachycardia with rate 100 beats per minute, frequent APCs, poor R-wave progression through the anterior precordial leads, ST and T wave abnormality in leads one and aVL, left axis deviation/left anterior fascicular block   Outpatient Encounter Prescriptions as of 05/10/2011   Medication   Sig  Dispense  Refill   .  albuterol (PROVENTIL) (2.5 MG/3ML) 0.083% nebulizer solution  Take 2.5 mg by nebulization every 6 (six) hours as needed.     Marland Kitchen  albuterol (VENTOLIN HFA) 108 (90 BASE) MCG/ACT inhaler  Inhale 2 puffs into the lungs every 4 (four) hours as needed.     .  diltiazem (CARDIZEM CD) 240 MG 24 hr capsule  Take 240 mg by mouth daily.     .  ferrous fumarate (HEMOCYTE - 106 MG FE) 325 (106 FE) MG TABS  Take 1 tablet by mouth 2 (two) times daily.     .  Fluticasone-Salmeterol (ADVAIR DISKUS) 250-50 MCG/DOSE AEPB  Inhale 1 puff into the lungs every 12 (twelve) hours.     .  nicotine (NICODERM CQ - DOSED IN MG/24 HR) 7 mg/24hr patch  Place 1 patch onto the skin daily.     Marland Kitchen  omeprazole (PRILOSEC) 20 MG capsule  Take 20 mg by mouth. Twice daily before meals     .  pravastatin (PRAVACHOL) 40 MG tablet  Take 40 mg by mouth daily.     Marland Kitchen  tiotropium (SPIRIVA) 18 MCG inhalation capsule  Place 18 mcg into inhaler and inhale daily.      Review of Systems  Constitutional: Negative.  HENT: Negative.  Eyes: Negative.  Respiratory: Positive for cough and shortness of breath.  Cardiovascular: Positive for chest pain, palpitations and leg swelling.  Gastrointestinal: Negative.  Musculoskeletal: Negative.  Skin: Negative.  Neurological: Negative.  Hematological: Negative.  Psychiatric/Behavioral: Negative.  All other systems reviewed and are negative.   BP 149/69  Pulse 99  Ht 5\' 3"  (1.6 m)  Wt 191 lb (86.637 kg)  BMI 33.83 kg/m2  Physical Exam  Nursing note and vitals reviewed.  Constitutional: He is oriented to person, place, and time. He appears well-developed and well-nourished.  HENT:  Head: Normocephalic.  Nose: Nose normal.  Mouth/Throat: Oropharynx is clear and moist.  Eyes: Conjunctivae are normal. Pupils are equal, round, and reactive to light.  Neck: Normal range of motion. Neck supple. No JVD present.  Cardiovascular: Normal rate, regular rhythm, S1 normal, S2  normal and intact distal pulses. Frequent extrasystoles are present. Exam reveals no gallop and no friction rub.  Murmur heard.  Systolic murmur is present with a grade of 2/6  2+ pitting edema to below the knees  Pulmonary/Chest: Effort normal and breath sounds normal. No respiratory distress. He has no wheezes. He has no rales. He exhibits no tenderness.  Abdominal: Soft. Bowel sounds are normal. He exhibits no distension. There is no tenderness.  Musculoskeletal: Normal range of motion. He exhibits no edema and no tenderness.  Lymphadenopathy:  He has no cervical adenopathy.  Neurological: He is alert and oriented to person, place, and time. Coordination normal.  Skin: Skin is warm and dry. No rash noted. No erythema.  Psychiatric: He has a normal mood and affect. His behavior is normal. Judgment and thought content normal.      Assessment and Plan   Chest pain - I am concerned about recent episodes of chest pain given his history of CAD. Cardiac catheterization report unavailable though notes from Dr. Artis Flock indicate total LAD occlusion. We have discussed this with him and given that he is high risk for worsening coronary artery disease, we will repeat his cardiac catheterization. This will be scheduled at his convenience in the next week or so.  Peripheral edema -  His edema is impressive. Uncertain if this is secondary to pulmonary hypertension, possibly secondary to venous insufficiency with exacerbation from his calcium channel blocker. We have ordered an echocardiogram to evaluate his right ventricular systolic pressures. We'll hold his diltiazem and start bystolic 20 mg q.a.m.. I have suggested to him that if he has any worsening shortness of breath or symptoms of bronchospasm that he call our office.   Tachycardia -  He continues to have tachycardia and palpitations despite diltiazem. Diltiazem may not be the best choice for him given his worsening lower extremity edema, itching,  swelling and discomfort. We will hold the diltiazem as mentioned and change to bystolic in the hope that this does not exacerbate his COPD.  Hypercholesterolemia - Currently on pravastatin. Goal LDL less than 70.  COPD (chronic obstructive pulmonary disease) -  He has been seen by Dr. Kendrick Fries and reports his inhaler regimen to has improved his breathing but he continues to have significant trouble with any exertion. Unable to exclude cardiac etiology. I would probably hold off on starting pulmonary rehabilitation until after the catheterization and echocardiogram.  Previous Version  Mild hypertension -  We will hold the diltiazem but will start bystolic . We have asked him to closely monitor his blood pressure at home as well as heart rate.

## 2011-05-25 ENCOUNTER — Other Ambulatory Visit: Payer: Self-pay

## 2011-05-25 ENCOUNTER — Inpatient Hospital Stay (HOSPITAL_BASED_OUTPATIENT_CLINIC_OR_DEPARTMENT_OTHER)
Admission: RE | Admit: 2011-05-25 | Discharge: 2011-05-25 | Disposition: A | Discharge status: Home / Self Care | Payer: Medicare Other | Source: Ambulatory Visit | Attending: Cardiovascular Disease | Admitting: Cardiovascular Disease

## 2011-05-25 ENCOUNTER — Encounter (HOSPITAL_BASED_OUTPATIENT_CLINIC_OR_DEPARTMENT_OTHER): Payer: Self-pay | Admitting: Cardiovascular Disease

## 2011-05-25 ENCOUNTER — Encounter (HOSPITAL_BASED_OUTPATIENT_CLINIC_OR_DEPARTMENT_OTHER)
Admission: RE | Disposition: A | Discharge status: Home / Self Care | Payer: Self-pay | Source: Ambulatory Visit | Attending: Cardiovascular Disease

## 2011-05-25 DIAGNOSIS — R079 Chest pain, unspecified: Secondary | ICD-10-CM | POA: Insufficient documentation

## 2011-05-25 DIAGNOSIS — R0602 Shortness of breath: Secondary | ICD-10-CM | POA: Insufficient documentation

## 2011-05-25 DIAGNOSIS — I2510 Atherosclerotic heart disease of native coronary artery without angina pectoris: Secondary | ICD-10-CM

## 2011-05-25 DIAGNOSIS — R609 Edema, unspecified: Secondary | ICD-10-CM | POA: Insufficient documentation

## 2011-05-25 LAB — POCT I-STAT 3, VENOUS BLOOD GAS (G3P V)
pCO2, Ven: 40.7 mmHg — ABNORMAL LOW (ref 45.0–50.0)
pH, Ven: 7.368 — ABNORMAL HIGH (ref 7.250–7.300)

## 2011-05-25 LAB — POCT I-STAT 3, ART BLOOD GAS (G3+)
Acid-base deficit: 1 mmol/L (ref 0.0–2.0)
O2 Saturation: 89 %
pCO2 arterial: 37.1 mmHg (ref 35.0–45.0)

## 2011-05-25 SURGERY — JV LEFT HEART CATHETERIZATION WITH CORONARY ANGIOGRAM
Anesthesia: Moderate Sedation

## 2011-05-25 MED ORDER — ASPIRIN 81 MG PO CHEW
324.0000 mg | CHEWABLE_TABLET | ORAL | Status: AC
  Administered 2011-05-25: 324 mg via ORAL

## 2011-05-25 MED ORDER — SODIUM CHLORIDE 0.9 % IV SOLN: 250.0000 mL | INTRAVENOUS | Status: DC | PRN

## 2011-05-25 MED ORDER — ACETAMINOPHEN 325 MG PO TABS: 650.0000 mg | ORAL_TABLET | ORAL | Status: DC | PRN

## 2011-05-25 MED ORDER — TORSEMIDE 20 MG PO TABS: 20.0000 mg | ORAL_TABLET | Freq: Every day | ORAL | Status: DC

## 2011-05-25 MED ORDER — POTASSIUM CHLORIDE CRYS ER 20 MEQ PO TBCR: 20.0000 meq | EXTENDED_RELEASE_TABLET | Freq: Every day | ORAL | Status: DC

## 2011-05-25 MED ORDER — ONDANSETRON HCL 4 MG/2ML IJ SOLN: 4.0000 mg | Freq: Four times a day (QID) | INTRAMUSCULAR | Status: DC | PRN

## 2011-05-25 NOTE — OR Nursing (Signed)
Dr Gollan at bedside to discuss results and treatment plan with pt and family 

## 2011-05-25 NOTE — Progress Notes (Signed)
Discharge instructions completed, ambulated to bathroom without bleeding from right groin site, discharged to home via wheelchair with son.

## 2011-05-25 NOTE — Discharge Instructions (Signed)
Please start torsemide 20 mg oral daily starting on Friday with potassium one pill a day. Prescriptions will be called into your pharmacy b the [facility] office.

## 2011-05-25 NOTE — OR Nursing (Signed)
Tegaderm dressing applied, site level 0, bedrest begins 1140

## 2011-05-25 NOTE — OR Nursing (Signed)
Meal served 

## 2011-05-25 NOTE — Op Note (Signed)
Cardiac Catheterization Procedure Note  Name: Matthew Miles MRN: [id number] DOB: Jul 10, 1928  Procedure: Left Heart Cath, Selective Coronary Angiography, LV angiography  Indication:  Chest pain, SOB, edema, known CAD (occluded LAD)  Procedural details: The right groin was prepped, draped, and anesthetized with 1% lidocaine. Using modified Seldinger technique, a 5 French sheath was introduced into the right femoral artery. Standard Judkins catheters were used for coronary angiography and left ventriculography. Catheter exchanges were performed over a guidewire. There were no immediate procedural complications. The patient was transferred to the post catheterization recovery area for further monitoring.  Procedural Findings:  Hemodynamics:     AO 140/64/99    LV 139/24/33    RA   20/18/16    RV  53/13/21    Pulmonary capillary wedge  mean of 24    PA  52/26/37     Arterial saturation 89%    PA saturation  61%    Cardiac output  5.5    Cardiac index  2.9   Coronary angiography:  Coronary dominance: Right  Left mainstem:   Left main is a moderate to large size vessel that bifurcates to the LAD and left circumflex. No significant disease noted  Left anterior descending (LAD):   LAD is a moderate to large size vessel that is occluded 100% after the takeoff of the diagonal number one vessel. Proximal LAD has mild luminal irregularities. Diagonal branch is a moderate to large size vessel with mild luminal irregularities.  Left circumflex (LCx):  Circumflex is a moderate to large size vessel that appears to be dominant or codominant. Moderate sized branching OM that takes off from the mid circumflex. Mild luminal irregularities noted  Right coronary artery (RCA):  Moderate sized vessel that extends distally with PDA branch there is at least mild mid RCA disease that is diffuse.   Left ventriculography: Left ventricular systolic function is normal, LVEF is estimated at 55-65%, there is no  significant mitral regurgitation. No significant aortic valve stenosis.  Final Conclusions:   Severe single vessel disease with occlusion of his mid LAD (previously seen ). No other significant stenoses that would contribute to chest pain . Normal LV function . Elevated right ventricular systolic pressures and wedge pressure consistent with pulmonary hypertension and fluid overload . Elevated left ventricular end-diastolic pressure. We will start him on torsemide 20 mg daily with potassium 20 mEq daily with close follow up in clinic .   Recommendations:   Julien Nordmann 05/25/2011, 11:28 AM

## 2011-06-01 ENCOUNTER — Telehealth: Payer: Self-pay | Admitting: Cardiovascular Disease

## 2011-06-01 NOTE — Telephone Encounter (Signed)
Called spoke with pt's daughter she states pt was started on Torsemide

## 2011-06-01 NOTE — Telephone Encounter (Signed)
Pt daughter calling stating that pt is not going to the bathroom to urinate as frequent and he was when he started on the lasix in the hospital. Pt legs and feet are swollen slightly.

## 2011-06-01 NOTE — Telephone Encounter (Signed)
As mentioned, Would probably keep at one a day unless he feels he needs two a day. Will check BMP on his visit 3/1

## 2011-06-01 NOTE — Telephone Encounter (Signed)
Called spoke with pt's daughter she states pt was started on Torsemide 20mg  qd after cardiac cath in hospital last week on 05/25/11.  Pt has been taking daily and after 2 days showed significant improvement in SOB and in edema, urinary output increased, however they were instructed to call if urine output slowed. Daughter reports urine output has slowed since hospital d/c, pt's breathing remains significantly improved, edema is still present, but much better than was in the hospital.  BP has been stable, and has not been running low or high per pt's daughter report.  Daughter states she was told pt may need to increase Torsemide dosage to 2 tablets daily.  Please advise.  Pt has f/u appt schedule with you on 06/10/11 at 11:45am. Thanks

## 2011-06-02 NOTE — Telephone Encounter (Signed)
Called spoke with pt's daughter.  Advised of Dr Windell Hummingbird recommendations to continue on Demadex 20mg  once daily.  Advised could increase to 2 tablets daily as needed if breathing worsens, or LE edema worsens, but continue on 1 tablet daily as maintenance dosage until OV with Dr Mariah Milling on 06/10/11, where we will check blood work to evaluate hydration and fluid. BP yesterday was 120's/60's. Pt is feeling much better per daughter report.

## 2011-06-10 ENCOUNTER — Encounter: Payer: Self-pay | Admitting: Cardiovascular Disease

## 2011-06-10 ENCOUNTER — Ambulatory Visit (INDEPENDENT_AMBULATORY_CARE_PROVIDER_SITE_OTHER): Payer: Medicare Other | Admitting: Cardiovascular Disease

## 2011-06-10 DIAGNOSIS — I5030 Unspecified diastolic (congestive) heart failure: Secondary | ICD-10-CM

## 2011-06-10 DIAGNOSIS — R609 Edema, unspecified: Secondary | ICD-10-CM

## 2011-06-10 DIAGNOSIS — I2510 Atherosclerotic heart disease of native coronary artery without angina pectoris: Secondary | ICD-10-CM

## 2011-06-10 DIAGNOSIS — J449 Chronic obstructive pulmonary disease, unspecified: Secondary | ICD-10-CM

## 2011-06-10 DIAGNOSIS — I10 Essential (primary) hypertension: Secondary | ICD-10-CM

## 2011-06-10 MED ORDER — TORSEMIDE 20 MG PO TABS: 20.0000 mg | ORAL_TABLET | Freq: Every day | ORAL | Status: DC

## 2011-06-10 NOTE — Progress Notes (Signed)
Patient ID: Matthew Miles, male    DOB: [birth year], 78 y.o.   MRN: [id number]  HPI Comments: Mr. Knee is a 78 year old gentleman with history of coronary artery disease, previous MI and catheterization possibly 10 years ago though the results are not available to [place] at this time. Long history of smoking for 60 years,  bronchitis requiring several courses of antibiotics at the end of 2012, prednisone and nebulizers with admission to the hospital in December for hematemesis and gastric ulcer seen on EGD, found to have COPD and oxygenation down to 83% with ambulation, nonsustained VT per the notes, acute renal failure who presents for followup after recent cardiac catheterization.  Right heart catheterization showed significant fluid overload with wedge pressure of 24, moderate pulmonary hypertension. Catheterization showed occluded LAD which was old, no other significant stenoses requiring intervention. We started him on torsemide 20 mg daily with potassium at the time of his catheterization and he reports that he is feeling much better with improved shortness of breath. He does drink a significant amount of soda and other beverages and has lost only 4 pounds. Recently had chicken soup. His daughter reports that he was doing better and now she feels the fluid is coming back. He reports that his weight at home is 170 pounds. He had a recent 24 hour viral gastroenteritis.  Cardiac cath: Left mainstem:   Left main is a moderate to large size vessel that bifurcates to the LAD and left circumflex. No significant disease noted Left anterior descending (LAD):   LAD is a moderate to large size vessel that is occluded 100% after the takeoff of the diagonal number one vessel. Proximal LAD has mild luminal irregularities. Diagonal branch is a moderate to large size vessel with mild luminal irregularities. Left circumflex (LCx):  Circumflex is a moderate to large size vessel that appears to be dominant or  codominant. Moderate sized branching OM that takes off from the mid circumflex. Mild luminal irregularities noted Right coronary artery (RCA):  Moderate sized vessel that extends distally with PDA branch there is at least mild mid RCA disease that is diffuse.   Left ventriculography: Left ventricular systolic function is normal, LVEF is estimated at 55-65%, there is no significant mitral regurgitation. No significant aortic valve stenosis.  EKG shows normal sinus rhythm with frequent APC, rate 94 beats per minute, left axis deviation, nonspecific ST abnormality in the anterolateral leads      Outpatient Encounter Prescriptions as of 05/10/2011  Medication Sig Dispense Refill  . albuterol (PROVENTIL) (2.5 MG/3ML) 0.083% nebulizer solution Take 2.5 mg by nebulization every 6 (six) hours as needed.      Marland Kitchen albuterol (VENTOLIN HFA) 108 (90 BASE) MCG/ACT inhaler Inhale 2 puffs into the lungs every 4 (four) hours as needed.      . diltiazem (CARDIZEM CD) 240 MG 24 hr capsule Take 240 mg by mouth daily.      . ferrous fumarate (HEMOCYTE - 106 MG FE) 325 (106 FE) MG TABS Take 1 tablet by mouth 2 (two) times daily.      . Fluticasone-Salmeterol (ADVAIR DISKUS) 250-50 MCG/DOSE AEPB Inhale 1 puff into the lungs every 12 (twelve) hours.      . nicotine (NICODERM CQ - DOSED IN MG/24 HR) 7 mg/24hr patch Place 1 patch onto the skin daily.      Marland Kitchen omeprazole (PRILOSEC) 20 MG capsule Take 20 mg by mouth. Twice daily before meals      . pravastatin (PRAVACHOL) 40 MG tablet  Take 40 mg by mouth daily.      Marland Kitchen tiotropium (SPIRIVA) 18 MCG inhalation capsule Place 18 mcg into inhaler and inhale daily.         Review of Systems  Constitutional: Negative.   HENT: Negative.   Eyes: Negative.   Respiratory: Positive for shortness of breath.   Gastrointestinal: Negative.   Musculoskeletal: Negative.   Skin: Negative.   Neurological: Negative.   Hematological: Negative.   Psychiatric/Behavioral: Negative.   All  other systems reviewed and are negative.    BP 123/70  Pulse 94  Ht 5\' 3"  (1.6 m)  Wt 187 lb 1.9 oz (84.877 kg)  BMI 33.15 kg/m2  Physical Exam  Nursing note and vitals reviewed. Constitutional: He is oriented to person, place, and time. He appears well-developed and well-nourished.  HENT:  Head: Normocephalic.  Nose: Nose normal.  Mouth/Throat: Oropharynx is clear and moist.  Eyes: Conjunctivae are normal. Pupils are equal, round, and reactive to light.  Neck: Normal range of motion. Neck supple. No JVD present.  Cardiovascular: Normal rate, regular rhythm, S1 normal, S2 normal and intact distal pulses.  Frequent extrasystoles are present. Exam reveals no gallop and no friction rub.   Murmur heard.  Systolic murmur is present with a grade of 2/6       Trace to 1+ pitting edema to below the knees  Pulmonary/Chest: Effort normal and breath sounds normal. No respiratory distress. He has no wheezes. He has no rales. He exhibits no tenderness.  Abdominal: Soft. Bowel sounds are normal. He exhibits no distension. There is no tenderness.  Musculoskeletal: Normal range of motion. He exhibits edema. He exhibits no tenderness.  Lymphadenopathy:    He has no cervical adenopathy.  Neurological: He is alert and oriented to person, place, and time. Coordination normal.  Skin: Skin is warm and dry. No rash noted. No erythema.  Psychiatric: He has a normal mood and affect. His behavior is normal. Judgment and thought content normal.           Assessment and Plan

## 2011-06-10 NOTE — Assessment & Plan Note (Addendum)
Very elevated pressures on right heart catheterization. He continues to drink significant fluids. We have suggested he increase torsemide to 20 mg twice a day with potassium. We will aim for an additional 5 pound weight loss.

## 2011-06-10 NOTE — Assessment & Plan Note (Signed)
Stable disease, no intervention needed. Further chest pain could be managed medically. Small vessel disease also seen.

## 2011-06-10 NOTE — Assessment & Plan Note (Signed)
Edema is secondary to underlying pulmonary hypertension and fluid overload. Improvement on torsemide 20 mg daily. Will increase torsemide to 20 mg twice a day.

## 2011-06-10 NOTE — Patient Instructions (Addendum)
You are doing well. Please increase the torsemide to two a day Take  potassium once a day Minimize fluid intake Decrease salt intake  Please call us if you have new issues that need to be addressed before your next appt.  Your physician wants you to follow-up in: 3 months.  You will receive a reminder letter in the mail two months in advance. If you don't receive a letter, please call our office to schedule the follow-up appointment.

## 2011-06-10 NOTE — Assessment & Plan Note (Signed)
He has followup with Dr. Kendrick Fries.

## 2011-06-13 ENCOUNTER — Ambulatory Visit (INDEPENDENT_AMBULATORY_CARE_PROVIDER_SITE_OTHER): Payer: Medicare Other | Admitting: Pulmonary Disease

## 2011-06-13 ENCOUNTER — Encounter: Payer: Self-pay | Admitting: Pulmonary Disease

## 2011-06-13 ENCOUNTER — Ambulatory Visit: Payer: Medicare Other | Admitting: Pulmonary Disease

## 2011-06-13 VITALS — BP 110/60 | HR 90 | Temp 97.9°F | Wt 185.8 lb

## 2011-06-13 DIAGNOSIS — J449 Chronic obstructive pulmonary disease, unspecified: Secondary | ICD-10-CM

## 2011-06-13 DIAGNOSIS — K219 Gastro-esophageal reflux disease without esophagitis: Secondary | ICD-10-CM

## 2011-06-13 MED ORDER — AZITHROMYCIN 250 MG PO TABS: ORAL_TABLET | ORAL | Status: AC

## 2011-06-13 NOTE — Assessment & Plan Note (Signed)
I still don't have PFT's back from [facility], but he says that they were done.  We have requested that they send these again today.  I think that he has severe COPD based on his smoking history and degree of symptoms.  It sounds like he has at least mild chronic bronchitis.   He appears to be doing much better on spiriva and albuterol, as he has not taken Advair for at least two weeks.    He appears to have a mild cold with cough that started two days ago.  He is not more short of breath with this.  Plan: -we will give him a z-pack for the cough and will have him call us by the end of the week if no improvement -we will continue the spiriva and albuterol, hold on restarting the advair for now -referral to pulmonary rehab

## 2011-06-13 NOTE — Assessment & Plan Note (Signed)
Still having significant symptoms despite taking the omeprazole.  We gave him a GERD diet and asked him to discuss with GI on his visit with them next week.

## 2011-06-13 NOTE — Progress Notes (Signed)
Subjective:    Patient ID: Matthew Miles, male    DOB: 11-Aug-1928, 78 y.o.   MRN: [id number]  Synopsis: This is a pleasant 78 y/o male who was hospitalized in January of 2013 at [facility] for a gastric ulcer and was referred to [place] for likely COPD.  He was a heavy smoker up until that point and quit during that hospitalization.  On his first visit with [place] he was grossly volume overloaded so we sent him to Dr. Mariah Milling.  He had a left heart catheterization on 05/25/11 showing small vessel disease and elevated right heart pressures (RA mean 16, PA mean 37, PCWP 24).   HPI 3/4 ROV -- Two days after starting torsemide,wheezing, swelling, and dyspnea have improved.  In the last two to three days he started getting a cold.  Has dry cough, some wheezing some no shortness of breath.  No fever, no chills. He is not using the albuterol often.  He is noticing acid reflux symptoms at night.  He is not familiar with GERD diet and doesn't follow any specific dietary restrictions.  He is supposed to have a repeat EGD on 06/20/11.  He is not using the Advair, and hasn't been on it for at least two weeks.   Review of Systems Gen: no fever or chills, no sweats HEENT: no sinus congestion, ear pain PULM: per HPI CV: Swelling much improved, no chest pain     Past Medical History  Diagnosis Date  . COPD (chronic obstructive pulmonary disease)   . Smoker   . Mild hypertension   . Hypercholesterolemia   . Upper GI bleed 1980  . Hematemesis/vomiting blood 04/10/11  . Prostate cancer   . Prostate cancer     radiation therapy   . Coronary artery disease   . Heart murmur     Objective:   Physical Exam  Filed Vitals:   06/13/11 1349  BP: 110/60  Pulse: 90  Temp: 97.9 F (36.6 C)  TempSrc: Oral  Weight: 185 lb 12.8 oz (84.278 kg)  SpO2: 95%   Gen: chronically ill appearing, no acute distress HEENT: NCAT, PERRL, EOMi, OP clear, neck supple without masses PULM: Ins rhonchi L mid lung and few insp crackles L  base CV: RRR, systolic murmur noted, no JVD AB: BS+, soft, nontender, no hsm Ext: warm, pitting edema noted in ankles, no clubbing, no cyanosis Derm: no rash or skin breakdown Neuro: A&Ox4, CN II-XII intact, strength 5/5 in all 4 extremities      Assessment & Plan:   COPD (chronic obstructive pulmonary disease) I still don't have PFT's back from [facility], but he says that they were done.  We have requested that they send these again today.  I think that he has severe COPD based on his smoking history and degree of symptoms.  It sounds like he has at least mild chronic bronchitis.   He appears to be doing much better on spiriva and albuterol, as he has not taken Advair for at least two weeks.    He appears to have a mild cold with cough that started two days ago.  He is not more short of breath with this.  Plan: -we will give him a z-pack for the cough and will have him call us by the end of the week if no improvement -we will continue the spiriva and albuterol, hold on restarting the advair for now -referral to pulmonary rehab  GERD (gastroesophageal reflux disease) Still having significant symptoms despite taking the  omeprazole.  We gave him a GERD diet and asked him to discuss with GI on his visit with them next week.    Updated Medication List Outpatient Encounter Prescriptions as of 06/13/2011  Medication Sig Dispense Refill  . albuterol (PROVENTIL) (2.5 MG/3ML) 0.083% nebulizer solution Take 2.5 mg by nebulization every 6 (six) hours as needed.      Marland Kitchen albuterol (VENTOLIN HFA) 108 (90 BASE) MCG/ACT inhaler Inhale 2 puffs into the lungs every 4 (four) hours as needed.      . ferrous fumarate (HEMOCYTE - 106 MG FE) 325 (106 FE) MG TABS Take 1 tablet by mouth daily.       Marland Kitchen NITROSTAT 0.4 MG SL tablet 500 each as needed.      Marland Kitchen omeprazole (PRILOSEC) 20 MG capsule Take 20 mg by mouth. Twice daily before meals      . potassium chloride SA (K-DUR,KLOR-CON) 20 MEQ tablet Take 1 tablet (20  mEq total) by mouth daily.  30 tablet  6  . pravastatin (PRAVACHOL) 40 MG tablet Take 40 mg by mouth daily.      Marland Kitchen tiotropium (SPIRIVA) 18 MCG inhalation capsule Place 18 mcg into inhaler and inhale daily.      Marland Kitchen torsemide (DEMADEX) 20 MG tablet Take 20 mg by mouth 2 (two) times daily.      Marland Kitchen DISCONTD: torsemide (DEMADEX) 20 MG tablet Take 1 tablet (20 mg total) by mouth daily.  60 tablet  6  . azithromycin (ZITHROMAX Z-PAK) 250 MG tablet Take 2 tablets (500 mg) on  Day 1,  followed by 1 tablet (250 mg) once daily on Days 2 through 5.  6 each  0  . DISCONTD: Fluticasone-Salmeterol (ADVAIR DISKUS) 250-50 MCG/DOSE AEPB Inhale 1 puff into the lungs every 12 (twelve) hours.

## 2011-06-13 NOTE — Patient Instructions (Signed)
Take the zpack as written and call us if you are not better by 3/7. Continue taking spiriva every day and albuterol as needed. Continue to hold the Advair for now, we will re address starting this on the next visit. You need to go to pulmonary rehab at [facility].  We will send a referral.   We will see you back in 3 months.

## 2011-06-14 ENCOUNTER — Telehealth: Payer: Self-pay | Admitting: Pulmonary Disease

## 2011-06-14 NOTE — Telephone Encounter (Signed)
I called Mr. Simonetti's daughter and let her know that her father's PFT's from [facility] showed mild to moderate obstruction.  No changes to current regimen indicated right now.

## 2011-06-14 NOTE — Telephone Encounter (Signed)
Matthew Miles at [facility] informed that PFT's were done at [facility].

## 2011-06-15 ENCOUNTER — Telehealth: Payer: Self-pay | Admitting: Pulmonary Disease

## 2011-06-15 MED ORDER — PREDNISONE 10 MG PO TABS: ORAL_TABLET | ORAL | Status: DC

## 2011-06-15 NOTE — Telephone Encounter (Signed)
See note attached to prior telephone note from today and let patient know that I'm sending in prednisone and they need to call if not better by Friday.

## 2011-06-15 NOTE — Telephone Encounter (Signed)
Delsym and guaifenesin OTC, use as directed.  See other note.

## 2011-06-15 NOTE — Telephone Encounter (Signed)
Spoke with pt and notified of recs per BQ. Pt verbalized understanding and states nothing further needed.

## 2011-06-15 NOTE — Telephone Encounter (Signed)
Please let them know that I will send an Rx for prednisone but if he is not better by Friday they need to call us to let us know.

## 2011-06-15 NOTE — Telephone Encounter (Signed)
Tell them to use OTC Delsym as directed, can use guaifenesin as directed as well.

## 2011-06-15 NOTE — Telephone Encounter (Signed)
I spoke with pt daughter and she states the pt has been having increased cough, and nasal congestion since appt on 06-13-11. She states the cough is much worse at night so pt is unable to sleep as well. She states he is not having any increased SOB, only increased cough. Pt daughter states that Dr. Kendrick Fries mentioned prednisone if abx did not work, and she is also requesting something for the pt to take for the cough to help him rest at night. Please advise. Carron Curie, CMA Allergies  Allergen Reactions  . Sulfa Antibiotics     GI upset

## 2011-06-15 NOTE — Telephone Encounter (Signed)
Called, spoke with pt's daughter, Matthew Miles.  I informed her Dr. Kendrick Fries would like Matthew Miles to take prednisone, and rx was sent to pharmacy.  They should call back if he is no better by Friday.  She verbalized understanding of this, and states pt's cough is "harsh" and worse qhs.  She would like to know if he will be willing to give Matthew Miles cough syrup as well.  Dr. Kendrick Fries, pls advise.  Thank you.

## 2011-06-16 ENCOUNTER — Telehealth: Payer: Self-pay | Admitting: Pulmonary Disease

## 2011-06-16 NOTE — Telephone Encounter (Signed)
Received fax from [facility] Pulm rehab program stating pt's PFTs did not qualify him for the pulm rehab program.  Per Dr. Kendrick Fries, please call [facility] back and see if a dx of CHF would be approve as pt despirately needs pulm rehab.  Called [facility] and [facility] for them to call back and ask for [place].    Verlon Au I am forwarding this message to you as an Lorain Childes so you know what the call is about with [facility] calls back.  Thanks!!

## 2011-06-20 NOTE — Telephone Encounter (Signed)
Spoke with Matthew Miles. He states CHF will work, so I faxed new order using this dx to him. Nothing further was needed.

## 2011-06-20 NOTE — Telephone Encounter (Signed)
Matthew Miles from [facility] pulmonary rehab returning Matthew Miles's call can be reached at [phone].[name]

## 2011-06-21 ENCOUNTER — Encounter: Payer: Self-pay | Admitting: Pulmonary Disease

## 2011-06-23 ENCOUNTER — Ambulatory Visit (INDEPENDENT_AMBULATORY_CARE_PROVIDER_SITE_OTHER): Payer: Medicare Other | Admitting: Cardiovascular Disease

## 2011-06-23 ENCOUNTER — Encounter: Payer: Self-pay | Admitting: Cardiovascular Disease

## 2011-06-23 VITALS — BP 110/52 | HR 88 | Ht 63.0 in | Wt 183.0 lb

## 2011-06-23 DIAGNOSIS — I5030 Unspecified diastolic (congestive) heart failure: Secondary | ICD-10-CM

## 2011-06-23 DIAGNOSIS — J449 Chronic obstructive pulmonary disease, unspecified: Secondary | ICD-10-CM

## 2011-06-23 DIAGNOSIS — R609 Edema, unspecified: Secondary | ICD-10-CM

## 2011-06-23 DIAGNOSIS — I2510 Atherosclerotic heart disease of native coronary artery without angina pectoris: Secondary | ICD-10-CM

## 2011-06-23 DIAGNOSIS — E7800 Pure hypercholesterolemia, unspecified: Secondary | ICD-10-CM

## 2011-06-23 NOTE — Patient Instructions (Addendum)
You are doing well. Continue torsemide twice a day with one potassium  Continue to monitor your weight and write it down If your weight decreases 5 pounds, only take one torsemide Take torsemide two a day for a three pound weight gain  For stomach, continue on prilosec two a day OK to take extra zantac or pepcid for heartburn in addition  Please call us if you have new issues that need to be addressed before your next appt.  Your physician wants you to follow-up in: 2 months.  You will receive a reminder letter in the mail two months in advance. If you don't receive a letter, please call our office to schedule the follow-up appointment.

## 2011-06-23 NOTE — Assessment & Plan Note (Signed)
Goal LDL less than 70. He reports that he is scheduled to have cholesterol done this month with Dr. Sheppard Penton.

## 2011-06-23 NOTE — Assessment & Plan Note (Signed)
He has followup with Dr. Kendrick Fries. We have encouraged him to continue on his inhalers.

## 2011-06-23 NOTE — Assessment & Plan Note (Signed)
Recent cardiac catheterization showing stable disease. Right heart catheterization showing significant fluid retention/pulmonary hypertension/diastolic dysfunction.

## 2011-06-23 NOTE — Progress Notes (Signed)
Patient ID: Matthew Miles, male    DOB: 1928-05-03, 78 y.o.   MRN: [id number]  HPI Comments: [name] is a 78 year old gentleman with history of coronary artery disease, previous MI and catheterization possibly 10 years ago though the results are not available to [place] at this time. Long history of smoking for 60 years,  bronchitis requiring several courses of antibiotics at the end of 2012, prednisone and nebulizers with admission to the hospital in December for hematemesis and gastric ulcer seen on EGD, found to have COPD and oxygenation down to 83% with ambulation, nonsustained VT per the notes, acute renal failure who presents for followup.  Recent left and right heart cath done in February 2013 showing pulmonary hypertension, stable severe coronary artery disease as below. Normal ejection fraction Right heart catheterization showed significant fluid overload with wedge pressure of 24, moderate pulmonary hypertension. Catheterization showed occluded LAD which was old, no other significant stenoses requiring intervention. We started him on torsemide 20 mg daily with potassium at the time of his catheterization and he reports that he is feeling much better with improved shortness of breath. He presented again to the office 2 weeks ago and we increased the torsemide to 20 g twice a day. He did not pick up his new prescription and reports that insurance would not let him. He ran out of torsemide this past weekend and quickly had increasing shortness of breath. He went to [facility] urgent care. Restarted on torsemide 1-1/2 days later on Monday after he picked up his prescription and has been taking torsemide twice a day. He feels better and weight is 4 pounds less than last clinic visit. He continues to have some leg swelling. He continues to have a poor diet, eats and drinks what he wants.        Outpatient Encounter Prescriptions as of 06/23/2011  Medication Sig Dispense Refill  . albuterol  (PROVENTIL) (2.5 MG/3ML) 0.083% nebulizer solution Take 2.5 mg by nebulization every 6 (six) hours as needed.      Marland Kitchen albuterol (VENTOLIN HFA) 108 (90 BASE) MCG/ACT inhaler Inhale 2 puffs into the lungs every 4 (four) hours as needed.      Marland Kitchen aspirin 81 MG tablet Take 81 mg by mouth daily.      . ferrous fumarate (HEMOCYTE - 106 MG FE) 325 (106 FE) MG TABS Take 1 tablet by mouth daily.       Marland Kitchen NITROSTAT 0.4 MG SL tablet 500 each as needed.      Marland Kitchen omeprazole (PRILOSEC) 20 MG capsule Take 20 mg by mouth. Twice daily before meals      . potassium chloride SA (K-DUR,KLOR-CON) 20 MEQ tablet Take 1 tablet (20 mEq total) by mouth daily.  30 tablet  6  . pravastatin (PRAVACHOL) 40 MG tablet Take 40 mg by mouth daily.      . predniSONE (DELTASONE) 10 MG tablet Take 40mg  po daily for 3 days, then take 30mg  po daily for 3 days, then take 20mg  po daily for two days, then take 10mg  po daily for 2 days  30 tablet  0  . tiotropium (SPIRIVA) 18 MCG inhalation capsule Place 18 mcg into inhaler and inhale daily.      Marland Kitchen torsemide (DEMADEX) 20 MG tablet Take 20 mg by mouth 2 (two) times daily.         Review of Systems  Constitutional: Negative.   HENT: Negative.   Eyes: Negative.   Respiratory: Positive for shortness of breath.  Cardiovascular: Positive for leg swelling.  Gastrointestinal: Negative.   Musculoskeletal: Negative.   Skin: Negative.   Neurological: Negative.   Hematological: Negative.   Psychiatric/Behavioral: Negative.   All other systems reviewed and are negative.    BP 110/52  Pulse 88  Ht 5\' 3"  (1.6 m)  Wt 183 lb (83.008 kg)  BMI 32.42 kg/m2  SpO2 99%  Physical Exam  Nursing note and vitals reviewed. Constitutional: He is oriented to person, place, and time. He appears well-developed and well-nourished.  HENT:  Head: Normocephalic.  Nose: Nose normal.  Mouth/Throat: Oropharynx is clear and moist.  Eyes: Conjunctivae are normal. Pupils are equal, round, and reactive to light.    Neck: Normal range of motion. Neck supple. No JVD present.  Cardiovascular: Normal rate, regular rhythm, S1 normal, S2 normal and intact distal pulses.  Frequent extrasystoles are present. Exam reveals no gallop and no friction rub.   Murmur heard.  Systolic murmur is present with a grade of 2/6       Trace to 1+ pitting edema to below the knees  Pulmonary/Chest: Effort normal and breath sounds normal. No respiratory distress. He has no wheezes. He has no rales. He exhibits no tenderness.  Abdominal: Soft. Bowel sounds are normal. He exhibits no distension. There is no tenderness.  Musculoskeletal: Normal range of motion. He exhibits edema. He exhibits no tenderness.  Lymphadenopathy:    He has no cervical adenopathy.  Neurological: He is alert and oriented to person, place, and time. Coordination normal.  Skin: Skin is warm and dry. No rash noted. No erythema.  Psychiatric: He has a normal mood and affect. His behavior is normal. Judgment and thought content normal.           Assessment and Plan

## 2011-06-23 NOTE — Assessment & Plan Note (Signed)
He continues to have diastolic CHF symptoms though mildly improved. We will continue torsemide 20 mg twice a day and have suggested an additional 5 pound weight loss. Once he has 5 pound weight loss, we have instructed him to take torsemide 20 g daily with extra torsemide if his weight increases more than 3 pounds.

## 2011-06-23 NOTE — Assessment & Plan Note (Signed)
Underlying edema of the lower extremities is consistent with fluid overload. Still present on today's visit

## 2011-07-05 ENCOUNTER — Encounter: Payer: Self-pay | Admitting: Cardiovascular Disease

## 2011-07-08 ENCOUNTER — Encounter: Payer: Self-pay | Admitting: Cardiovascular Disease

## 2011-07-11 ENCOUNTER — Telehealth: Payer: Self-pay

## 2011-07-11 ENCOUNTER — Encounter: Payer: Self-pay | Admitting: *Deleted

## 2011-07-11 NOTE — Telephone Encounter (Signed)
Clearance note faxed to [phone].

## 2011-07-11 NOTE — Telephone Encounter (Signed)
Patient needs cardiac clearance for an EGD; patient will need sedation per Dr. Mechele Collin at [facility]. Please advise if cleared. Patient has history of CHF and COPD.

## 2011-07-11 NOTE — Telephone Encounter (Signed)
He would be acceptable risk for EGD. Would request that he have minimal IV fluid hydration given diastolic CHF. No further testing needed We can send a letter to GI to proceed with the study ,  Dr. Mechele Collin

## 2011-07-12 ENCOUNTER — Encounter: Payer: Self-pay | Admitting: Pulmonary Disease

## 2011-07-18 ENCOUNTER — Ambulatory Visit: Payer: Self-pay | Admitting: Unknown Physician Specialty

## 2011-07-20 ENCOUNTER — Other Ambulatory Visit: Payer: Self-pay | Admitting: Cardiovascular Disease

## 2011-07-20 NOTE — Telephone Encounter (Signed)
Pt take 20 MG 2 times daily

## 2011-07-21 MED ORDER — TORSEMIDE 20 MG PO TABS: 20.0000 mg | ORAL_TABLET | Freq: Two times a day (BID) | ORAL | Status: DC

## 2011-08-01 ENCOUNTER — Telehealth: Payer: Self-pay | Admitting: Pulmonary Disease

## 2011-08-01 MED ORDER — AZITHROMYCIN 250 MG PO TABS: ORAL_TABLET | ORAL | Status: AC

## 2011-08-01 NOTE — Telephone Encounter (Signed)
Ok for another zpak then needs ov

## 2011-08-01 NOTE — Telephone Encounter (Signed)
Pt aware and rx sent. Keyshla Tunison, CMA  

## 2011-08-01 NOTE — Telephone Encounter (Signed)
[place] patient--Spoke with the patient and he is c/o increased productive cough with yellow phlegm, chest congestion x 4 days. He denies any increased SOB, chest tightness or wheezing, sinus congestion. Pt unable to come to  for appt. Please advise. Carron Curie, CMA Allergies  Allergen Reactions  . Sulfa Antibiotics     GI upset   [facility]

## 2011-08-10 ENCOUNTER — Encounter: Payer: Self-pay | Admitting: Pulmonary Disease

## 2011-08-23 ENCOUNTER — Ambulatory Visit (INDEPENDENT_AMBULATORY_CARE_PROVIDER_SITE_OTHER): Payer: Medicare Other | Admitting: Cardiovascular Disease

## 2011-08-23 ENCOUNTER — Encounter: Payer: Self-pay | Admitting: Cardiovascular Disease

## 2011-08-23 VITALS — BP 145/76 | HR 90 | Ht 62.0 in | Wt 192.0 lb

## 2011-08-23 DIAGNOSIS — J449 Chronic obstructive pulmonary disease, unspecified: Secondary | ICD-10-CM

## 2011-08-23 DIAGNOSIS — R739 Hyperglycemia, unspecified: Secondary | ICD-10-CM

## 2011-08-23 DIAGNOSIS — I5030 Unspecified diastolic (congestive) heart failure: Secondary | ICD-10-CM

## 2011-08-23 DIAGNOSIS — I10 Essential (primary) hypertension: Secondary | ICD-10-CM

## 2011-08-23 DIAGNOSIS — I2510 Atherosclerotic heart disease of native coronary artery without angina pectoris: Secondary | ICD-10-CM

## 2011-08-23 DIAGNOSIS — E7800 Pure hypercholesterolemia, unspecified: Secondary | ICD-10-CM

## 2011-08-23 DIAGNOSIS — R7309 Other abnormal glucose: Secondary | ICD-10-CM

## 2011-08-23 NOTE — Assessment & Plan Note (Signed)
9 pound weight gain since his last clinic visit. We have suggested he take torsemide twice a day with blood work in 2 weeks' time. He will take potassium 20 mEq  per day.

## 2011-08-23 NOTE — Assessment & Plan Note (Signed)
COPD appears stable. Ambulating without oxygen.

## 2011-08-23 NOTE — Progress Notes (Signed)
Patient ID: Matthew Miles, male    DOB: 12-Jan-1929, 78 y.o.   MRN: [id number]  HPI Comments: Matthew Miles is a 78 year old gentleman with history of coronary artery disease, previous MI and catheterization possibly 10 years ago though the results are not available to [place], Long history of smoking for 60 years,  bronchitis requiring several courses of antibiotics at the end of 2012, prednisone and nebulizers with admission to the hospital in December for hematemesis and gastric ulcer seen on EGD, found to have COPD and oxygenation down to 83% with ambulation, nonsustained VT per the notes, acute renal failure who presents for followup.  Recent left and right heart cath done in February 2013 showing pulmonary hypertension, stable severe coronary artery disease as below. Normal ejection fraction  Right heart catheterization showed significant fluid overload with wedge pressure of 24, moderate pulmonary hypertension. Catheterization showed occluded LAD which was old, no other significant stenoses requiring intervention.  We initially started him on low-dose torsemide, increasing to twice a day for edema. His weight has fluctuated initially in the 190 range, down to 178 at the beginning of March then down to 183. Since his last clinic visit, he is up 9 pounds though he denies any significant shortness of breath or leg edema. This past week he has decreased his torsemide to once per day down from twice a day. We did hold bystolic in the past as he reports this caused fluid retention and edema.  He reports his sugar numbers have been elevated  EKG shows normal sinus rhythm with rate 90 beats per minute with APCs, left anterior fascicular block       Outpatient Encounter Prescriptions as of 08/23/2011  Medication Sig Dispense Refill  . albuterol (PROVENTIL) (2.5 MG/3ML) 0.083% nebulizer solution Take 2.5 mg by nebulization every 6 (six) hours as needed.      Marland Kitchen albuterol (VENTOLIN HFA) 108 (90 BASE)  MCG/ACT inhaler Inhale 2 puffs into the lungs every 4 (four) hours as needed.      Marland Kitchen aspirin 81 MG tablet Take 81 mg by mouth daily.      . ferrous fumarate (HEMOCYTE - 106 MG FE) 325 (106 FE) MG TABS Take 1 tablet by mouth daily.       Marland Kitchen NITROSTAT 0.4 MG SL tablet 500 each as needed.      Marland Kitchen omeprazole (PRILOSEC) 20 MG capsule Take 20 mg by mouth. Twice daily before meals      . potassium chloride SA (K-DUR,KLOR-CON) 20 MEQ tablet Take 1 tablet (20 mEq total) by mouth daily.  30 tablet  6  . pravastatin (PRAVACHOL) 40 MG tablet Take 40 mg by mouth daily.      . predniSONE (DELTASONE) 10 MG tablet Take 40mg  po daily for 3 days, then take 30mg  po daily for 3 days, then take 20mg  po daily for two days, then take 10mg  po daily for 2 days  30 tablet  0  . tiotropium (SPIRIVA) 18 MCG inhalation capsule Place 18 mcg into inhaler and inhale daily.      Marland Kitchen torsemide (DEMADEX) 20 MG tablet Take 1 tablet (20 mg total) by mouth 2 (two) times daily.  60 tablet  3     Review of Systems  Constitutional: Positive for unexpected weight change.  HENT: Negative.   Eyes: Negative.   Cardiovascular: Positive for leg swelling.  Gastrointestinal: Negative.   Musculoskeletal: Negative.   Skin: Negative.   Neurological: Negative.   Hematological: Negative.   Psychiatric/Behavioral: Negative.  All other systems reviewed and are negative.   BP 145/76  Pulse 90  Ht 5\' 2"  (1.575 m)  Wt 192 lb (87.091 kg)  BMI 35.12 kg/m2  Physical Exam  Nursing note and vitals reviewed. Constitutional: He is oriented to person, place, and time. He appears well-developed and well-nourished.  HENT:  Head: Normocephalic.  Nose: Nose normal.  Mouth/Throat: Oropharynx is clear and moist.  Eyes: Conjunctivae are normal. Pupils are equal, round, and reactive to light.  Neck: Normal range of motion. Neck supple. No JVD present.  Cardiovascular: Normal rate, regular rhythm, S1 normal, S2 normal and intact distal pulses.   Frequent extrasystoles are present. Exam reveals no gallop and no friction rub.   Murmur heard.  Systolic murmur is present with a grade of 2/6       Trace edema to below the knees  Pulmonary/Chest: Effort normal and breath sounds normal. No respiratory distress. He has no wheezes. He has no rales. He exhibits no tenderness.  Abdominal: Soft. Bowel sounds are normal. He exhibits no distension. There is no tenderness.  Musculoskeletal: Normal range of motion. He exhibits edema. He exhibits no tenderness.  Lymphadenopathy:    He has no cervical adenopathy.  Neurological: He is alert and oriented to person, place, and time. Coordination normal.  Skin: Skin is warm and dry. No rash noted. No erythema.  Psychiatric: He has a normal mood and affect. His behavior is normal. Judgment and thought content normal.           Assessment and Plan

## 2011-08-23 NOTE — Patient Instructions (Addendum)
You are doing well. Please take torsemide twice a day  Come in for labs on May 29th at 8:15 AM  Please call us if you have new issues that need to be addressed before your next appt.  Your physician wants you to follow-up in: 3 months.  You will receive a reminder letter in the mail two months in advance. If you don't receive a letter, please call our office to schedule the follow-up appointment.

## 2011-08-23 NOTE — Assessment & Plan Note (Signed)
Continue current cholesterol medication. 

## 2011-08-23 NOTE — Assessment & Plan Note (Signed)
We have suggested he closely monitor his blood pressure at home and write this down for [place].

## 2011-08-23 NOTE — Assessment & Plan Note (Signed)
Currently with no symptoms of angina. No further workup at this time. Continue current medication regimen. 

## 2011-09-09 ENCOUNTER — Ambulatory Visit (INDEPENDENT_AMBULATORY_CARE_PROVIDER_SITE_OTHER): Payer: Medicare Other

## 2011-09-09 DIAGNOSIS — Z79899 Other long term (current) drug therapy: Secondary | ICD-10-CM

## 2011-09-09 DIAGNOSIS — R739 Hyperglycemia, unspecified: Secondary | ICD-10-CM

## 2011-09-09 DIAGNOSIS — I509 Heart failure, unspecified: Secondary | ICD-10-CM

## 2011-09-09 DIAGNOSIS — I5030 Unspecified diastolic (congestive) heart failure: Secondary | ICD-10-CM

## 2011-09-10 ENCOUNTER — Encounter: Payer: Self-pay | Admitting: Pulmonary Disease

## 2011-09-10 LAB — BASIC METABOLIC PANEL
BUN/Creatinine Ratio: 17 (ref 10–22)
BUN: 20 mg/dL (ref 8–27)
GFR calc Af Amer: 67 mL/min/{1.73_m2} (ref >59)
GFR calc non Af Amer: 58 mL/min/{1.73_m2} — ABNORMAL LOW (ref >59)
Glucose: 127 mg/dL — ABNORMAL HIGH (ref 65–99)
Potassium: 4.6 mmol/L (ref 3.5–5.2)

## 2011-09-10 LAB — HEMOGLOBIN A1C
Est. average glucose Bld gHb Est-mCnc: 148 mg/dL
Hgb A1c MFr Bld: 6.8 % — ABNORMAL HIGH (ref 4.8–5.6)

## 2011-09-20 ENCOUNTER — Ambulatory Visit: Payer: Medicare Other | Admitting: Cardiovascular Disease

## 2011-09-21 ENCOUNTER — Encounter: Payer: Self-pay | Admitting: Pulmonary Disease

## 2011-09-21 ENCOUNTER — Ambulatory Visit (INDEPENDENT_AMBULATORY_CARE_PROVIDER_SITE_OTHER): Payer: Medicare Other | Admitting: Pulmonary Disease

## 2011-09-21 VITALS — BP 134/56 | HR 95 | Temp 98.1°F | Wt 192.0 lb

## 2011-09-21 DIAGNOSIS — J449 Chronic obstructive pulmonary disease, unspecified: Secondary | ICD-10-CM

## 2011-09-21 MED ORDER — ROFLUMILAST 500 MCG PO TABS: 500.0000 ug | ORAL_TABLET | Freq: Every day | ORAL | Status: DC

## 2011-09-21 MED ORDER — DOXYCYCLINE HYCLATE 100 MG PO TABS: 100.0000 mg | ORAL_TABLET | Freq: Two times a day (BID) | ORAL | Status: AC

## 2011-09-21 MED ORDER — PREDNISONE 1 MG PO TABS: ORAL_TABLET | ORAL | Status: DC

## 2011-09-21 NOTE — Assessment & Plan Note (Signed)
Although Matthew Miles has done very well with pulmonary rehab in the last few months and his symptoms have improved dramatically, Matthew Miles has developed another COPD flare.  This is the second in three months which means that he really needs to start an additional agent to prevent exacerbations.  Plan: -doxycycline and prednisone -start darilesp -cont pulm rehab -cont other meds as written

## 2011-09-21 NOTE — Patient Instructions (Signed)
Take the prednisone and doxycycline as written. Let us know if the roflumilast (Darilesp) is too expensive Keep taking the spiriva and going to pulmonary rehab We will see you back in 3 months.

## 2011-09-21 NOTE — Progress Notes (Signed)
Subjective:    Patient ID: Matthew Miles, male    DOB: 08-07-1928, 78 y.o.   MRN: [id number]  Synopsis: This is a pleasant 78 y/o male who was hospitalized in January of 2013 at [facility] for a gastric ulcer and was referred to [place] for likely COPD.  He was a heavy smoker up until that point and quit during that hospitalization.  On his first visit with [place] he was grossly volume overloaded so we sent him to Dr. Mariah Milling.  He had a left heart catheterization on 05/25/11 showing small vessel disease and elevated right heart pressures (RA mean 16, PA mean 37, PCWP 24).   HPI  3/4 ROV -- Two days after starting torsemide,wheezing, swelling, and dyspnea have improved.  In the last two to three days he started getting a cold.  Has dry cough, some wheezing some no shortness of breath.  No fever, no chills. He is not using the albuterol often.  He is noticing acid reflux symptoms at night.  He is not familiar with GERD diet and doesn't follow any specific dietary restrictions.  He is supposed to have a repeat EGD on 06/20/11.  He is not using the Advair, and hasn't been on it for at least two weeks.  09/21/11 ROV -- Matthew Miles returns today stating that he has really enjoyed pulmonary rehab and he feels like his symptoms of shortness of breath and wheezing have really improved.  He has been participating regularly and evidently they have asked him to volunteer there.  Unfortunately bronchitis symptoms started three days ago with cough and an increase in sputum production.  He has been more short of breath this week.  No fevers, chills or chest pain.  He is compliant with his spiriva and ventolin.  Review of Systems  Constitutional: Positive for fatigue. Negative for fever and chills.  Eyes: Positive for discharge and redness. Negative for pain.  Respiratory: Positive for cough, chest tightness and shortness of breath.   Cardiovascular: Negative for chest pain and leg swelling.       Past Medical History  Diagnosis Date    . COPD (chronic obstructive pulmonary disease)   . Smoker   . Mild hypertension   . Hypercholesterolemia   . Upper GI bleed 1980  . Hematemesis/vomiting blood 04/10/11  . Prostate cancer   . Prostate cancer     radiation therapy   . Coronary artery disease   . Heart murmur     Objective:   Physical Exam   Filed Vitals:   09/21/11 1653  BP: 134/56  Pulse: 95  Temp: 98.1 F (36.7 C)  TempSrc: Oral  Weight: 192 lb (87.091 kg)  SpO2: 96%   Gen: chronically ill appearing, no acute distress HEENT: NCAT, conjunctiva red, some drainage noted, EOMi,  PULM: Few insp crackles bilaterally, minimal wheezing CV: RRR, systolic murmur noted, no JVD AB: BS+, soft, nontender, no hsm Ext: warm, trace pitting edema noted in ankles improved, no clubbing, no cyanosis      Assessment & Plan:   COPD (chronic obstructive pulmonary disease) Although Matthew Miles has done very well with pulmonary rehab in the last few months and his symptoms have improved dramatically, Matthew Miles has developed another COPD flare.  This is the second in three months which means that he really needs to start an additional agent to prevent exacerbations.  Plan: -doxycycline and prednisone -start darilesp -cont pulm rehab -cont other meds as written    Updated Medication List Outpatient Encounter Prescriptions as of  09/21/2011  Medication Sig Dispense Refill  . albuterol (PROVENTIL) (2.5 MG/3ML) 0.083% nebulizer solution Take 2.5 mg by nebulization every 6 (six) hours as needed.      Marland Kitchen albuterol (VENTOLIN HFA) 108 (90 BASE) MCG/ACT inhaler Inhale 2 puffs into the lungs every 4 (four) hours as needed.      Marland Kitchen aspirin 81 MG tablet Take 81 mg by mouth daily.      Marland Kitchen NITROSTAT 0.4 MG SL tablet 500 each as needed.      Marland Kitchen omeprazole (PRILOSEC) 20 MG capsule Take 20 mg by mouth. Twice daily before meals      . potassium chloride SA (K-DUR,KLOR-CON) 20 MEQ tablet Take 1 tablet (20 mEq total) by mouth daily.  30 tablet  6  .  pravastatin (PRAVACHOL) 40 MG tablet Take 40 mg by mouth daily.      Marland Kitchen tiotropium (SPIRIVA) 18 MCG inhalation capsule Place 18 mcg into inhaler and inhale daily.      Marland Kitchen torsemide (DEMADEX) 20 MG tablet Take 1 tablet (20 mg total) by mouth 2 (two) times daily.  60 tablet  3  . DISCONTD: ferrous fumarate (HEMOCYTE - 106 MG FE) 325 (106 FE) MG TABS Take 1 tablet by mouth daily.

## 2011-10-10 ENCOUNTER — Encounter: Payer: Self-pay | Admitting: Pulmonary Disease

## 2011-10-19 ENCOUNTER — Encounter: Payer: Self-pay | Admitting: Pulmonary Disease

## 2011-11-03 ENCOUNTER — Telehealth: Payer: Self-pay | Admitting: Pulmonary Disease

## 2011-11-03 NOTE — Telephone Encounter (Signed)
Called and spoke with pt's daughter. She states that pt is doing much better on daliresp, and has not been sick with bronchitis since he started med. The only problem is, it is a little expensive for him, and he wonders if he can cut back to taking maybe 1 tablet every other day but wanted to check with Dr. Kendrick Fries first. Please advise, thanks!

## 2011-11-03 NOTE — Telephone Encounter (Signed)
That is fine with me.

## 2011-11-04 NOTE — Telephone Encounter (Signed)
Pt's daughter notified that per Dr. Kendrick Fries pt can take the Daliresp every other day.

## 2011-11-23 ENCOUNTER — Ambulatory Visit: Payer: Medicare Other | Admitting: Cardiovascular Disease

## 2011-11-25 ENCOUNTER — Ambulatory Visit (INDEPENDENT_AMBULATORY_CARE_PROVIDER_SITE_OTHER): Payer: Medicare Other | Admitting: Cardiovascular Disease

## 2011-11-25 ENCOUNTER — Encounter: Payer: Self-pay | Admitting: Cardiovascular Disease

## 2011-11-25 VITALS — BP 140/60 | HR 102 | Ht 63.0 in | Wt 189.2 lb

## 2011-11-25 DIAGNOSIS — J449 Chronic obstructive pulmonary disease, unspecified: Secondary | ICD-10-CM

## 2011-11-25 DIAGNOSIS — I2789 Other specified pulmonary heart diseases: Secondary | ICD-10-CM

## 2011-11-25 DIAGNOSIS — E7800 Pure hypercholesterolemia, unspecified: Secondary | ICD-10-CM

## 2011-11-25 DIAGNOSIS — I10 Essential (primary) hypertension: Secondary | ICD-10-CM

## 2011-11-25 DIAGNOSIS — I272 Pulmonary hypertension, unspecified: Secondary | ICD-10-CM | POA: Insufficient documentation

## 2011-11-25 DIAGNOSIS — I2510 Atherosclerotic heart disease of native coronary artery without angina pectoris: Secondary | ICD-10-CM

## 2011-11-25 DIAGNOSIS — J4489 Other specified chronic obstructive pulmonary disease: Secondary | ICD-10-CM

## 2011-11-25 MED ORDER — POTASSIUM CHLORIDE CRYS ER 20 MEQ PO TBCR: 20.0000 meq | EXTENDED_RELEASE_TABLET | Freq: Every day | ORAL | Status: DC

## 2011-11-25 NOTE — Assessment & Plan Note (Signed)
Most recent lab work not available to [place]. Goal LDL less than 70. Continue statin.

## 2011-11-25 NOTE — Assessment & Plan Note (Signed)
Currently with no symptoms of angina. No further workup at this time. Continue current medication regimen. 

## 2011-11-25 NOTE — Assessment & Plan Note (Signed)
Previous labwork did not show overdiuresis. I have mentioned this to him and that he could take extra diuretic for symptom relief. He tends to hold his afternoon diuretic.

## 2011-11-25 NOTE — Assessment & Plan Note (Signed)
Worsening shortness of breath recently. Likely from COPD though unable to exclude pulmonary hypertension. No significant edema on exam. We have suggested he try extra diuretic for several days in row to see if this improves his symptoms.

## 2011-11-25 NOTE — Progress Notes (Signed)
Patient ID: Matthew Miles, male    DOB: 1929/02/18, 78 y.o.   MRN: [id number]  HPI Comments: [name] is a 78 year old gentleman with history of coronary artery disease, previous MI and catheterization possibly 10 years ago though the results are not available to [place], long history of smoking for 60 years,  bronchitis requiring several courses of antibiotics at the end of 2012, prednisone and nebulizers with admission to the hospital in December for hematemesis and gastric ulcer seen on EGD, found to have COPD and oxygenation down to 83% with ambulation, nonsustained VT per the notes, acute renal failure who presents for followup.  Recent left and right heart cath done in February 2013 showing pulmonary hypertension, stable severe coronary artery disease as below. Normal ejection fraction  Right heart catheterization showed significant fluid overload with wedge pressure of 24, moderate pulmonary hypertension. Catheterization showed occluded LAD which was old, no other significant stenoses requiring intervention.  We initially started him on low-dose torsemide, increasing to twice a day for edema. His weight has fluctuated initially in the 190 range, down to 178 at the beginning of March then down to 183. Last clinic visit, he was up 9 pounds  We did hold bystolic (20 mg) in the past as he reports this caused fluid retention and edema earlier this year (prior to starting torsemide). He reports he has had mild increase in shortness of breath over the past several weeks. His weight is down 3 pounds from his last clinic visit. He has been taking torsemide once daily, rarely in the afternoon as well. He has had minimal lower extremity edema. Family has noticed he has been breathing more heavily.   EKG shows normal sinus rhythm with rate 102 beats per minute with APCs, left anterior fascicular block, APCs noted       Outpatient Encounter Prescriptions as of 11/25/2011  Medication Sig Dispense  Refill  . albuterol (PROVENTIL) (2.5 MG/3ML) 0.083% nebulizer solution Take 2.5 mg by nebulization every 6 (six) hours as needed.      Marland Kitchen albuterol (VENTOLIN HFA) 108 (90 BASE) MCG/ACT inhaler Inhale 2 puffs into the lungs every 4 (four) hours as needed.      Marland Kitchen aspirin 81 MG tablet Take 81 mg by mouth daily.      . Ferrous Sulfate (IRON SUPPLEMENT PO) Take 65 mg by mouth daily.      Marland Kitchen NITROSTAT 0.4 MG SL tablet 500 each as needed.      Marland Kitchen omeprazole (PRILOSEC) 20 MG capsule Take 20 mg by mouth 2 (two) times daily. Twice daily before meals      . potassium chloride SA (K-DUR,KLOR-CON) 20 MEQ tablet Take 1 tablet (20 mEq total) by mouth daily.  30 tablet  6  . pravastatin (PRAVACHOL) 40 MG tablet Take 40 mg by mouth daily.      . roflumilast (DALIRESP) 500 MCG TABS tablet Take 1 tablet (500 mcg total) by mouth daily.  30 tablet  2  . tiotropium (SPIRIVA) 18 MCG inhalation capsule Place 18 mcg into inhaler and inhale daily.      Marland Kitchen torsemide (DEMADEX) 20 MG tablet Take 1 tablet (20 mg total) by mouth  Daily (sometimes twice a day)  60 tablet  3    Review of Systems  HENT: Negative.   Eyes: Negative.   Respiratory: Positive for shortness of breath.   Gastrointestinal: Negative.   Musculoskeletal: Negative.   Skin: Negative.   Neurological: Negative.   Hematological: Negative.   Psychiatric/Behavioral:  Negative.   All other systems reviewed and are negative.   BP 140/60  Pulse 102  Ht 5\' 3"  (1.6 m)  Wt 189 lb 4 oz (85.843 kg)  BMI 33.52 kg/m2  Physical Exam  Nursing note and vitals reviewed. Constitutional: He is oriented to person, place, and time. He appears well-developed and well-nourished.  HENT:  Head: Normocephalic.  Nose: Nose normal.  Mouth/Throat: Oropharynx is clear and moist.  Eyes: Conjunctivae are normal. Pupils are equal, round, and reactive to light.  Neck: Normal range of motion. Neck supple. No JVD present.  Cardiovascular: Normal rate, regular rhythm, S1 normal,  S2 normal and intact distal pulses.  Frequent extrasystoles are present. Exam reveals no gallop and no friction rub.   Murmur heard.  Systolic murmur is present with a grade of 2/6       Trace edema around the ankles  Pulmonary/Chest: Effort normal and breath sounds normal. No respiratory distress. He has no wheezes. He has no rales. He exhibits no tenderness.  Abdominal: Soft. Bowel sounds are normal. He exhibits no distension. There is no tenderness.  Musculoskeletal: Normal range of motion. He exhibits edema. He exhibits no tenderness.  Lymphadenopathy:    He has no cervical adenopathy.  Neurological: He is alert and oriented to person, place, and time. Coordination normal.  Skin: Skin is warm and dry. No rash noted. No erythema.  Psychiatric: He has a normal mood and affect. His behavior is normal. Judgment and thought content normal.           Assessment and Plan

## 2011-11-25 NOTE — Patient Instructions (Addendum)
You are doing well. No medication changes were made.  Take extra torsemide for increasing shortness of breath  Try miralex or  Milk of magnesia for constipation Take iron at least two times a day, spinach Please monitor your heart rate  Please call us if you have new issues that need to be addressed before your next appt.  Your physician wants you to follow-up in: 6 months.  You will receive a reminder letter in the mail two months in advance. If you don't receive a letter, please call our office to schedule the follow-up appointment.

## 2011-12-06 ENCOUNTER — Telehealth: Payer: Self-pay | Admitting: Pulmonary Disease

## 2011-12-06 NOTE — Telephone Encounter (Signed)
Daughter calling back stating she talked back with her father.   He c/o of head pain for 2 months only left side.

## 2011-12-06 NOTE — Telephone Encounter (Signed)
Called and spoke with Britta Mccreedy. She states that the pt has been c/o HA and "feeling like something crawling behind ear" x 2 wks. She wonders if daliresp could be causing this. The pt has been taking dalisresp since 09-21-11 w/o any issues though. I advised that it he should check with PCP and monitor his BP. This is not typical s/e of daliresp, but will check with BQ to be sure he does not want him to stop. She will call PCP. Please advise, thanks!

## 2011-12-06 NOTE — Telephone Encounter (Signed)
Continue daliresp Thanks for checking Agree with PCP follow up

## 2011-12-07 ENCOUNTER — Telehealth: Payer: Self-pay | Admitting: Cardiovascular Disease

## 2011-12-07 NOTE — Telephone Encounter (Signed)
Error

## 2011-12-07 NOTE — Telephone Encounter (Signed)
Spoke with Britta Mccreedy and notified her to have the pt cont daliresp and f/u with PCP. She verbalized understanding and states nothing further needed.

## 2011-12-14 ENCOUNTER — Ambulatory Visit: Payer: Medicare Other | Admitting: Pulmonary Disease

## 2011-12-16 ENCOUNTER — Ambulatory Visit (INDEPENDENT_AMBULATORY_CARE_PROVIDER_SITE_OTHER): Payer: Medicare Other | Admitting: Pulmonary Disease

## 2011-12-16 ENCOUNTER — Encounter: Payer: Self-pay | Admitting: Pulmonary Disease

## 2011-12-16 VITALS — BP 120/72 | HR 90 | Temp 98.1°F | Ht 63.0 in | Wt 191.8 lb

## 2011-12-16 DIAGNOSIS — J4489 Other specified chronic obstructive pulmonary disease: Secondary | ICD-10-CM

## 2011-12-16 DIAGNOSIS — J449 Chronic obstructive pulmonary disease, unspecified: Secondary | ICD-10-CM

## 2011-12-16 NOTE — Assessment & Plan Note (Signed)
This is been a very stable interval for Matthew Miles. He wants to take the Roflumilast every other day check think is fine since he seems to be getting benefit from it at that dose. He is not exercising at [facility] anymore but it sounds like he's maintaining a regular routine in his house with an exercise bike.  Plan:  -Flu shot was offered today but he wants to get his primary care doctor's office for free -Continue Roflumilast and his other inhalers at the current dosing. -I gave him a prescription for doxycycline to use in case he gets a bout of bronchitis, increased sputum production, or increasing shortness of breath between now and her next visit. -Continue exercising

## 2011-12-16 NOTE — Progress Notes (Signed)
Subjective:    Patient ID: Matthew Miles, male    DOB: [birth year], 78 y.o.   MRN: [id number]  Synopsis: This is a pleasant 78 y/o male who was hospitalized in January of 2013 at [facility] for a gastric ulcer and was referred to [place] for likely COPD.  He was a heavy smoker up until that point and quit during that hospitalization.  On his first visit with [place] he was grossly volume overloaded so we sent him to Dr. Mariah Milling.  He had a left heart catheterization on 05/25/11 showing small vessel disease and elevated right heart pressures (RA mean 16, PA mean 37, PCWP 24).   HPI  3/4 ROV -- Two days after starting torsemide,wheezing, swelling, and dyspnea have improved.  In the last two to three days he started getting a cold.  Has dry cough, some wheezing some no shortness of breath.  No fever, no chills. He is not using the albuterol often.  He is noticing acid reflux symptoms at night.  He is not familiar with GERD diet and doesn't follow any specific dietary restrictions.  He is supposed to have a repeat EGD on 06/20/11.  He is not using the Advair, and hasn't been on it for at least two weeks.  09/21/11 ROV -- Matthew Miles returns today stating that he has really enjoyed pulmonary rehab and he feels like his symptoms of shortness of breath and wheezing have really improved.  He has been participating regularly and evidently they have asked him to volunteer there.  Unfortunately bronchitis symptoms started three days ago with cough and an increase in sputum production.  He has been more short of breath this week.  No fevers, chills or chest pain.  He is compliant with his spiriva and ventolin.  12/16/2011 ROV -- Matthew Miles comes today and states he's doing pretty well. Since his last visit here with [place] he hasn't had much in the way of shortness of breath he's been very active. He stopped working out at [facility] he scattered exercise bike and some resistance equipment at home that he's been using regularly. He says he rides the bike at  least 10-15 minutes every other day. He is cutting his grass regularly and he hasn't had any increasing shortness of breath with that. He has a lot of questions about the Roflumilast because he says the co-pay is expensive. He is continuing to use and benefit from it and he is also continuing to use his inhalers.  Review of Systems  Constitutional: Negative for fever, chills and fatigue.  Eyes: Negative for pain, discharge and redness.  Respiratory: Positive for shortness of breath. Negative for cough and chest tightness.   Cardiovascular: Negative for chest pain and leg swelling.       Past Medical History  Diagnosis Date  . COPD (chronic obstructive pulmonary disease)   . Smoker   . Mild hypertension   . Hypercholesterolemia   . Upper GI bleed 1980  . Hematemesis/vomiting blood 04/10/11  . Prostate cancer   . Prostate cancer     radiation therapy   . Coronary artery disease   . Heart murmur     Objective:   Physical Exam   Filed Vitals:   12/16/11 1558  BP: 120/72  Pulse: 90  Temp: 98.1 F (36.7 C)  TempSrc: Oral  Height: 5\' 3"  (1.6 m)  Weight: 191 lb 12.8 oz (87 kg)  SpO2: 97%   Gen: chronically ill appearing, no acute distress HEENT: NCAT, EOMi, OP clear PULM:  CTA B CV: RRR, systolic murmur noted, no JVD AB: BS+, soft, nontender, no hsm Ext: warm, no edema, no clubbing, no cyanosis      Assessment & Plan:   COPD (chronic obstructive pulmonary disease) This is been a very stable interval for Matthew Miles. He wants to take the Roflumilast every other day check think is fine since he seems to be getting benefit from it at that dose. He is not exercising at [facility] anymore but it sounds like he's maintaining a regular routine in his house with an exercise bike.  Plan:  -Flu shot was offered today but he wants to get his primary care doctor's office for free -Continue Roflumilast and his other inhalers at the current dosing. -I gave him a prescription for doxycycline to  use in case he gets a bout of bronchitis, increased sputum production, or increasing shortness of breath between now and her next visit. -Continue exercising    Updated Medication List Outpatient Encounter Prescriptions as of 12/16/2011  Medication Sig Dispense Refill  . albuterol (PROVENTIL) (2.5 MG/3ML) 0.083% nebulizer solution Take 2.5 mg by nebulization every 6 (six) hours as needed.      Marland Kitchen albuterol (VENTOLIN HFA) 108 (90 BASE) MCG/ACT inhaler Inhale 2 puffs into the lungs every 4 (four) hours as needed.      Marland Kitchen aspirin 81 MG tablet Take 81 mg by mouth daily.      . Ferrous Sulfate (IRON SUPPLEMENT PO) Take 65 mg by mouth daily.      Marland Kitchen NITROSTAT 0.4 MG SL tablet 500 each as needed.      Marland Kitchen omeprazole (PRILOSEC) 20 MG capsule Take 20 mg by mouth 2 (two) times daily. Twice daily before meals      . potassium chloride SA (K-DUR,KLOR-CON) 20 MEQ tablet Take 1 tablet (20 mEq total) by mouth daily.  30 tablet  6  . pravastatin (PRAVACHOL) 40 MG tablet Take 40 mg by mouth daily.      . roflumilast (DALIRESP) 500 MCG TABS tablet Take 1 tablet (500 mcg total) by mouth daily.  30 tablet  2  . tiotropium (SPIRIVA) 18 MCG inhalation capsule Place 18 mcg into inhaler and inhale daily.      Marland Kitchen torsemide (DEMADEX) 20 MG tablet Take 1 tablet (20 mg total) by mouth 2 (two) times daily.  60 tablet  3    A week he will

## 2011-12-16 NOTE — Patient Instructions (Signed)
Keep taking the roflumilast as you are doing. Keep taking your other medications as you are doing. Keep working out at home. Use the doxycycline prescription as indicated for increased sputum production, change in color, or increasing shortness of breath.  We will see you back in 3 months or sooner if needed.

## 2011-12-19 ENCOUNTER — Telehealth: Payer: Self-pay | Admitting: Pulmonary Disease

## 2011-12-19 NOTE — Telephone Encounter (Signed)
Spoke with pt's daughter. She states that the pt is going to see his PCP tomorrow for flu shot. Nothing further needed.

## 2011-12-20 ENCOUNTER — Encounter: Payer: Self-pay | Admitting: Pulmonary Disease

## 2012-01-06 ENCOUNTER — Encounter: Payer: Self-pay | Admitting: Internal Medicine

## 2012-01-06 ENCOUNTER — Ambulatory Visit (INDEPENDENT_AMBULATORY_CARE_PROVIDER_SITE_OTHER): Payer: Medicare Other | Admitting: Internal Medicine

## 2012-01-06 ENCOUNTER — Ambulatory Visit: Payer: Medicare Other | Admitting: Internal Medicine

## 2012-01-06 VITALS — BP 152/78 | HR 90 | Temp 97.9°F | Resp 18 | Ht 63.0 in | Wt 191.0 lb

## 2012-01-06 DIAGNOSIS — L989 Disorder of the skin and subcutaneous tissue, unspecified: Secondary | ICD-10-CM

## 2012-01-06 DIAGNOSIS — E039 Hypothyroidism, unspecified: Secondary | ICD-10-CM

## 2012-01-06 DIAGNOSIS — D509 Iron deficiency anemia, unspecified: Secondary | ICD-10-CM | POA: Insufficient documentation

## 2012-01-06 DIAGNOSIS — R0989 Other specified symptoms and signs involving the circulatory and respiratory systems: Secondary | ICD-10-CM

## 2012-01-06 DIAGNOSIS — R0600 Dyspnea, unspecified: Secondary | ICD-10-CM | POA: Insufficient documentation

## 2012-01-06 DIAGNOSIS — R609 Edema, unspecified: Secondary | ICD-10-CM | POA: Insufficient documentation

## 2012-01-06 LAB — BRAIN NATRIURETIC PEPTIDE: Pro B Natriuretic peptide (BNP): 118 pg/mL — ABNORMAL HIGH (ref 0.0–100.0)

## 2012-01-06 LAB — CBC WITH DIFFERENTIAL/PLATELET
Eosinophils Relative: 0.5 % (ref 0.0–5.0)
HCT: 37.5 % — ABNORMAL LOW (ref 39.0–52.0)
Lymphs Abs: 1.2 10*3/uL (ref 0.7–4.0)
MCV: 89.2 fl (ref 78.0–100.0)
Monocytes Absolute: 0.7 10*3/uL (ref 0.1–1.0)
Platelets: 129 10*3/uL — ABNORMAL LOW (ref 150.0–400.0)
WBC: 6.9 10*3/uL (ref 4.5–10.5)

## 2012-01-06 LAB — COMPREHENSIVE METABOLIC PANEL
ALT: 25 U/L (ref 0–53)
Alkaline Phosphatase: 83 U/L (ref 39–117)
Glucose, Bld: 163 mg/dL — ABNORMAL HIGH (ref 70–99)
Sodium: 137 mEq/L (ref 135–145)
Total Bilirubin: 0.7 mg/dL (ref 0.3–1.2)
Total Protein: 6.8 g/dL (ref 6.0–8.3)

## 2012-01-06 LAB — TSH: TSH: 4.96 u[IU]/mL (ref 0.35–5.50)

## 2012-01-06 MED ORDER — TORSEMIDE 20 MG PO TABS: 20.0000 mg | ORAL_TABLET | Freq: Two times a day (BID) | ORAL | Status: DC

## 2012-01-06 NOTE — Assessment & Plan Note (Signed)
Skin lesion left medial knee concerning for squamous cell carcinoma. Will set up dermatology evaluation for resection.

## 2012-01-06 NOTE — Assessment & Plan Note (Signed)
Patient with history of dyspnea secondary to COPD and congestive heart failure. Daughter notes recent worsening of symptoms. Exam today is normal with no evidence of hypervolemia. No recent symptoms or clinical findings to suggest upper respiratory infection. Question if this may be related to deconditioning. Will check basic labs including CBC, CMP, and BNP. If unrevealing, consider referral to pulmonary rehabilitation.

## 2012-01-06 NOTE — Assessment & Plan Note (Signed)
Patient with history of bleeding gastric ulcers. Will check CBC and ferritin with labs today.

## 2012-01-06 NOTE — Assessment & Plan Note (Signed)
Patient with chronic history of bilateral lower extremity edema. Symptoms currently well-controlled with torsemide. Will continue. Will check renal function with labs today.

## 2012-01-06 NOTE — Progress Notes (Signed)
Subjective:    Patient ID: Matthew Miles, male    DOB: 1928-05-17, 78 y.o.   MRN: [id number]  HPI 78 year old male with history of COPD, diastolic congestive heart failure, chronic peripheral edema presents to establish care. He reports he is generally feeling well. His daughter notes that he seems to be more short of breath over the last few weeks. He denies any fever, chills, cough, congestion. He denies wheezing. He denies any change in peripheral edema. He reports full compliance with his medications. He notes he has been on a little bit of weight and notes some dietary indiscretion. He denies any chest pain or palpitations.  Outpatient Encounter Prescriptions as of 01/06/2012  Medication Sig Dispense Refill  . albuterol (PROVENTIL) (2.5 MG/3ML) 0.083% nebulizer solution Take 2.5 mg by nebulization every 6 (six) hours as needed.      Marland Kitchen albuterol (VENTOLIN HFA) 108 (90 BASE) MCG/ACT inhaler Inhale 2 puffs into the lungs every 4 (four) hours as needed.      . Ferrous Sulfate (IRON SUPPLEMENT PO) Take 65 mg by mouth daily.      Marland Kitchen NITROSTAT 0.4 MG SL tablet 500 each as needed.      Marland Kitchen omeprazole (PRILOSEC) 20 MG capsule Take 20 mg by mouth 2 (two) times daily. Twice daily before meals      . potassium chloride SA (K-DUR,KLOR-CON) 20 MEQ tablet Take 1 tablet (20 mEq total) by mouth daily.  30 tablet  6  . pravastatin (PRAVACHOL) 40 MG tablet Take 40 mg by mouth daily.      Marland Kitchen tiotropium (SPIRIVA) 18 MCG inhalation capsule Place 18 mcg into inhaler and inhale daily.      Marland Kitchen torsemide (DEMADEX) 20 MG tablet Take 1 tablet (20 mg total) by mouth 2 (two) times daily.  60 tablet  3  . DISCONTD: torsemide (DEMADEX) 20 MG tablet Take 1 tablet (20 mg total) by mouth 2 (two) times daily.  60 tablet  3  . aspirin 81 MG tablet Take 81 mg by mouth daily.      Marland Kitchen DISCONTD: roflumilast (DALIRESP) 500 MCG TABS tablet Take 1 tablet (500 mcg total) by mouth daily.  30 tablet  2   BP 152/78  Pulse 90  Temp 97.9 F  (36.6 C) (Oral)  Resp 18  Ht 5\' 3"  (1.6 m)  Wt 191 lb (86.637 kg)  BMI 33.83 kg/m2  SpO2 98%  Review of Systems  Constitutional: Negative for fever, chills, activity change, appetite change, fatigue and unexpected weight change.  Eyes: Negative for visual disturbance.  Respiratory: Positive for shortness of breath. Negative for cough, chest tightness and wheezing.   Cardiovascular: Positive for leg swelling. Negative for chest pain and palpitations.  Gastrointestinal: Negative for nausea, abdominal pain, diarrhea, blood in stool and abdominal distention.  Genitourinary: Negative for dysuria, urgency and difficulty urinating.  Musculoskeletal: Negative for arthralgias and gait problem.  Skin: Negative for color change and rash.  Hematological: Negative for adenopathy.  Psychiatric/Behavioral: Negative for disturbed wake/sleep cycle and dysphoric mood. The patient is not nervous/anxious.        Objective:   Physical Exam  Constitutional: He is oriented to person, place, and time. He appears well-developed and well-nourished. No distress.  HENT:  Head: Normocephalic and atraumatic.  Right Ear: External ear normal.  Left Ear: External ear normal.  Nose: Nose normal.  Mouth/Throat: Oropharynx is clear and moist. No oropharyngeal exudate.  Eyes: Conjunctivae normal and EOM are normal. Pupils are equal, round, and reactive  to light. Right eye exhibits no discharge. Left eye exhibits no discharge. No scleral icterus.  Neck: Normal range of motion. Neck supple. No tracheal deviation present. No thyromegaly present.  Cardiovascular: Normal rate, regular rhythm and normal heart sounds.  Exam reveals no gallop and no friction rub.   No murmur heard. Pulmonary/Chest: Effort normal. No accessory muscle usage. Not tachypneic. No respiratory distress. He has no decreased breath sounds (slightly prolonged expiratory phase). He has no wheezes. He has no rhonchi. He has no rales. He exhibits no  tenderness.  Abdominal: Soft. Bowel sounds are normal. He exhibits no distension and no mass. There is no tenderness. There is no rebound and no guarding.  Musculoskeletal: Normal range of motion. He exhibits no edema.  Lymphadenopathy:    He has no cervical adenopathy.  Neurological: He is alert and oriented to person, place, and time. No cranial nerve deficit. Coordination normal.  Skin: Skin is warm and dry. No rash noted. He is not diaphoretic. No erythema. No pallor.  Psychiatric: He has a normal mood and affect. His speech is normal and behavior is normal. Judgment and thought content normal. Cognition and memory are normal.          Assessment & Plan:

## 2012-02-16 ENCOUNTER — Ambulatory Visit: Payer: Medicare Other | Admitting: Internal Medicine

## 2012-03-07 ENCOUNTER — Encounter: Payer: Self-pay | Admitting: Internal Medicine

## 2012-03-07 ENCOUNTER — Ambulatory Visit (INDEPENDENT_AMBULATORY_CARE_PROVIDER_SITE_OTHER): Payer: Medicare Other | Admitting: Internal Medicine

## 2012-03-07 VITALS — BP 136/74 | HR 80 | Temp 97.8°F | Resp 16 | Wt 185.5 lb

## 2012-03-07 DIAGNOSIS — I2510 Atherosclerotic heart disease of native coronary artery without angina pectoris: Secondary | ICD-10-CM

## 2012-03-07 DIAGNOSIS — E119 Type 2 diabetes mellitus without complications: Secondary | ICD-10-CM

## 2012-03-07 DIAGNOSIS — S61411A Laceration without foreign body of right hand, initial encounter: Secondary | ICD-10-CM

## 2012-03-07 DIAGNOSIS — S61409A Unspecified open wound of unspecified hand, initial encounter: Secondary | ICD-10-CM

## 2012-03-07 DIAGNOSIS — L989 Disorder of the skin and subcutaneous tissue, unspecified: Secondary | ICD-10-CM

## 2012-03-07 DIAGNOSIS — D509 Iron deficiency anemia, unspecified: Secondary | ICD-10-CM

## 2012-03-07 NOTE — Assessment & Plan Note (Signed)
Currently asymptomatic. Will continue current medications.

## 2012-03-07 NOTE — Progress Notes (Signed)
Subjective:    Patient ID: Matthew Miles, male    DOB: July 18, 1928, 78 y.o.   MRN: [id number]  HPI 78 year old male with history of coronary artery disease, pulmonary hypertension, COPD presents for followup. In the interim since his last visit he underwent biopsy of skin lesion on left knee. Results are pending. He reports he has been dressing the wound himself and applying peroxide to the area. He denies any pain at the site. He also notes that shortly after having a biopsy performed he fell and lacerated his right palm. He did not seek emergency care for this. Instead, he has been followed by nurse at his facility who has been dressing the area with ointment and dry gauze. He denies any pain at the site. He reports he is up-to-date on his tetanus shot.  In terms of coronary artery disease, pulmonary hypertension, COPD he reports he has been feeling well and shortness of breath is much improved compared to previous. His only complaint today is dry mouth associated with use of Demadex. He also reports some occasional cramping in his calf. He notes that he typically eats baloney sandwich on his calves are bothering him and this seems to use the cramping. He denies any chest pain, diaphoresis, fatigue.  Outpatient Encounter Prescriptions as of 03/07/2012  Medication Sig Dispense Refill  . albuterol (PROVENTIL) (2.5 MG/3ML) 0.083% nebulizer solution Take 2.5 mg by nebulization every 6 (six) hours as needed.      Marland Kitchen albuterol (VENTOLIN HFA) 108 (90 BASE) MCG/ACT inhaler Inhale 2 puffs into the lungs every 4 (four) hours as needed.      Marland Kitchen aspirin 81 MG tablet Take 81 mg by mouth daily.      . Ferrous Sulfate (IRON SUPPLEMENT PO) Take 65 mg by mouth daily.      Marland Kitchen NITROSTAT 0.4 MG SL tablet 500 each as needed.      Marland Kitchen omeprazole (PRILOSEC) 20 MG capsule Take 20 mg by mouth 2 (two) times daily. Twice daily before meals      . potassium chloride SA (K-DUR,KLOR-CON) 20 MEQ tablet Take 1 tablet (20 mEq total) by  mouth daily.  30 tablet  6  . pravastatin (PRAVACHOL) 40 MG tablet Take 40 mg by mouth daily.      Marland Kitchen tiotropium (SPIRIVA) 18 MCG inhalation capsule Place 18 mcg into inhaler and inhale daily.      Marland Kitchen torsemide (DEMADEX) 20 MG tablet Take 1 tablet (20 mg total) by mouth 2 (two) times daily.  60 tablet  3   BP 136/74  Pulse 80  Temp 97.8 F (36.6 C) (Oral)  Resp 16  Wt 185 lb 8 oz (84.142 kg)  Review of Systems  Constitutional: Negative for fever, chills, activity change, appetite change, fatigue and unexpected weight change.  Eyes: Negative for visual disturbance.  Respiratory: Negative for cough and shortness of breath.   Cardiovascular: Negative for chest pain, palpitations and leg swelling.  Gastrointestinal: Negative for abdominal pain and abdominal distention.  Genitourinary: Negative for dysuria, urgency and difficulty urinating.  Musculoskeletal: Negative for arthralgias and gait problem.  Skin: Positive for wound. Negative for color change and rash.  Hematological: Negative for adenopathy.  Psychiatric/Behavioral: Negative for sleep disturbance and dysphoric mood. The patient is not nervous/anxious.        Objective:   Physical Exam  Constitutional: He is oriented to person, place, and time. He appears well-developed and well-nourished. No distress.  HENT:  Head: Normocephalic and atraumatic.  Right Ear: External  ear normal.  Left Ear: External ear normal.  Nose: Nose normal.  Mouth/Throat: Oropharynx is clear and moist. No oropharyngeal exudate.  Eyes: Conjunctivae normal and EOM are normal. Pupils are equal, round, and reactive to light. Right eye exhibits no discharge. Left eye exhibits no discharge. No scleral icterus.  Neck: Normal range of motion. Neck supple. No tracheal deviation present. No thyromegaly present.  Cardiovascular: Normal rate, regular rhythm and normal heart sounds.  Exam reveals no gallop and no friction rub.   No murmur heard. Pulmonary/Chest:  Effort normal and breath sounds normal. No accessory muscle usage. Not tachypneic. No respiratory distress. He has no wheezes. He has no rales. He exhibits no tenderness.  Musculoskeletal: Normal range of motion. He exhibits no edema.  Lymphadenopathy:    He has no cervical adenopathy.  Neurological: He is alert and oriented to person, place, and time. No cranial nerve deficit. Coordination normal.  Skin: Skin is warm and dry. Laceration noted. No rash noted. He is not diaphoretic. No erythema. No pallor.     Psychiatric: He has a normal mood and affect. His behavior is normal. Judgment and thought content normal.          Assessment & Plan:

## 2012-03-07 NOTE — Assessment & Plan Note (Signed)
Laceration after fall several weeks ago. Followed by home health nurse. Will continue dry dressing. Healing by secondary intention. Follow up prn.

## 2012-03-07 NOTE — Assessment & Plan Note (Signed)
S/p biopsy by dermatology. Results pending. Area of biopsy is macerated today. Encouraged pt not to use peroxide to clean area. Will use soap and water and apply dry non-stick gauze. Follow up prn. Will await pathology report.

## 2012-03-07 NOTE — Assessment & Plan Note (Signed)
BG mildly elevated with A1c of 6.8%. Pt does not regularly check BG at home. Will plan to recheck A1c with labs in Jan 2014. Would prefer to continue with dietary control of BG as not a good candidate for metformin given some intermittent renal insufficiency. Follow up 2 months.

## 2012-03-07 NOTE — Assessment & Plan Note (Signed)
Symptomatically doing well. Not regularly taking his iron supplements. Encouraged better compliance with this. Follow up with repeat CBC, ferritin in 2 months.

## 2012-03-14 ENCOUNTER — Telehealth: Payer: Self-pay | Admitting: Internal Medicine

## 2012-03-14 NOTE — Telephone Encounter (Signed)
Patient went to the pharmacy and they gave him the correct pill.

## 2012-03-14 NOTE — Telephone Encounter (Signed)
Daughter, Britta Mccreedy calling.  He went to [facility] and picked up a bottle of OTC Iron medication but they do not think it is same as what he got by prescription.  He had 220/65 and what he picked up OTC was 320/65?  In EPIC his orders are for 65mg  po daily.  Is this higher dose appropriated?  If not, please call in the medication to [facility] on [address].

## 2012-04-16 ENCOUNTER — Other Ambulatory Visit: Payer: Self-pay | Admitting: Family Medicine

## 2012-04-16 DIAGNOSIS — R609 Edema, unspecified: Secondary | ICD-10-CM

## 2012-04-16 NOTE — Telephone Encounter (Signed)
Patient is needing a refill on his Torsemide 20 mg. Patient is needing to take this medication twice a day.

## 2012-04-16 NOTE — Telephone Encounter (Signed)
Fine to fill. #60 with 3 refills.

## 2012-04-17 MED ORDER — TORSEMIDE 20 MG PO TABS: 20.0000 mg | ORAL_TABLET | Freq: Two times a day (BID) | ORAL | Status: DC

## 2012-04-17 NOTE — Telephone Encounter (Signed)
Rx sent to pharmacy   

## 2012-04-20 ENCOUNTER — Other Ambulatory Visit (INDEPENDENT_AMBULATORY_CARE_PROVIDER_SITE_OTHER): Payer: Medicare Other

## 2012-04-20 DIAGNOSIS — D509 Iron deficiency anemia, unspecified: Secondary | ICD-10-CM

## 2012-04-20 DIAGNOSIS — E119 Type 2 diabetes mellitus without complications: Secondary | ICD-10-CM

## 2012-04-20 LAB — COMPREHENSIVE METABOLIC PANEL
AST: 24 U/L (ref 0–37)
Albumin: 3.9 g/dL (ref 3.5–5.2)
Alkaline Phosphatase: 93 U/L (ref 39–117)
BUN: 16 mg/dL (ref 6–23)
Calcium: 9.4 mg/dL (ref 8.4–10.5)
Chloride: 95 mEq/L — ABNORMAL LOW (ref 96–112)
Creatinine, Ser: 1.2 mg/dL (ref 0.4–1.5)
Glucose, Bld: 444 mg/dL — ABNORMAL HIGH (ref 70–99)
Potassium: 4.4 mEq/L (ref 3.5–5.1)

## 2012-04-20 LAB — CBC WITH DIFFERENTIAL/PLATELET
Basophils Absolute: 0 10*3/uL (ref 0.0–0.1)
Eosinophils Absolute: 0 10*3/uL (ref 0.0–0.7)
HCT: 41.9 % (ref 39.0–52.0)
Hemoglobin: 14.3 g/dL (ref 13.0–17.0)
Lymphs Abs: 1.2 10*3/uL (ref 0.7–4.0)
MCHC: 34.2 g/dL (ref 30.0–36.0)
MCV: 88 fl (ref 78.0–100.0)
Monocytes Absolute: 0.6 10*3/uL (ref 0.1–1.0)
Monocytes Relative: 7.9 % (ref 3.0–12.0)
Neutro Abs: 5.4 10*3/uL (ref 1.4–7.7)
RDW: 13.8 % (ref 11.5–14.6)

## 2012-04-20 LAB — HEMOGLOBIN A1C: Hgb A1c MFr Bld: 15 % — ABNORMAL HIGH (ref 4.6–6.5)

## 2012-04-26 ENCOUNTER — Ambulatory Visit (INDEPENDENT_AMBULATORY_CARE_PROVIDER_SITE_OTHER): Payer: Medicare Other | Admitting: Internal Medicine

## 2012-04-26 ENCOUNTER — Encounter: Payer: Self-pay | Admitting: Internal Medicine

## 2012-04-26 VITALS — BP 160/60 | HR 100 | Temp 97.8°F | Ht 63.0 in | Wt 177.2 lb

## 2012-04-26 DIAGNOSIS — S61411A Laceration without foreign body of right hand, initial encounter: Secondary | ICD-10-CM

## 2012-04-26 DIAGNOSIS — E119 Type 2 diabetes mellitus without complications: Secondary | ICD-10-CM | POA: Insufficient documentation

## 2012-04-26 DIAGNOSIS — D509 Iron deficiency anemia, unspecified: Secondary | ICD-10-CM

## 2012-04-26 DIAGNOSIS — M79602 Pain in left arm: Secondary | ICD-10-CM | POA: Insufficient documentation

## 2012-04-26 DIAGNOSIS — L989 Disorder of the skin and subcutaneous tissue, unspecified: Secondary | ICD-10-CM

## 2012-04-26 DIAGNOSIS — S61409A Unspecified open wound of unspecified hand, initial encounter: Secondary | ICD-10-CM

## 2012-04-26 DIAGNOSIS — M79609 Pain in unspecified limb: Secondary | ICD-10-CM

## 2012-04-26 DIAGNOSIS — E1165 Type 2 diabetes mellitus with hyperglycemia: Secondary | ICD-10-CM

## 2012-04-26 MED ORDER — GLIPIZIDE ER 2.5 MG PO TB24: 2.5000 mg | ORAL_TABLET | Freq: Every day | ORAL | Status: DC

## 2012-04-26 NOTE — Assessment & Plan Note (Signed)
Pt reports symptoms have completely resolved. Exam is normal. Suspect symptoms related to muscular strain. Will continue to monitor.

## 2012-04-26 NOTE — Assessment & Plan Note (Signed)
Hemoglobin and ferritin improved with iron supplementation. Will continue iron sulfate once daily.

## 2012-04-26 NOTE — Progress Notes (Signed)
Subjective:    Patient ID: Matthew Miles, male    DOB: [birth year], 79 y.o.   MRN: [id number]  HPI 79 year old male with history of coronary artery disease, pulmonary hypertension, diabetes, iron deficiency anemia, COPD presents for followup. At his last visit, he was concerned about a laceration on his right hand. He reports that this laceration has completely healed. He denies any further pain at the site. He also had a lesion on his left knee which was biopsied by dermatology. Concerned that this area continues to appear crusted, as it did prior to biopsy. It is not painful.  He reports over the last few months, he has been craving orange juice. He has been consuming up to a half gallon of orange juice every day. He has been checking his sugar and has often had high blood sugars, above 200. Recent hemoglobin A1c was markedly increased at 15%. In the past, he has been able to control his blood sugars with diet.  His daughter reports that he has been complaining of some left arm pain. He reports that this has completely resolved. He describes it as aching in his left upper arm. He denies any chest pain, shortness of breath, diaphoresis. He denies any trauma to his arm. He denies weakness of change in ROM.   Outpatient Encounter Prescriptions as of 04/26/2012  Medication Sig Dispense Refill  . albuterol (PROVENTIL) (2.5 MG/3ML) 0.083% nebulizer solution Take 2.5 mg by nebulization every 6 (six) hours as needed.      Marland Kitchen albuterol (VENTOLIN HFA) 108 (90 BASE) MCG/ACT inhaler Inhale 2 puffs into the lungs every 4 (four) hours as needed.      Marland Kitchen aspirin 81 MG tablet Take 81 mg by mouth daily.      . Ferrous Sulfate (IRON SUPPLEMENT PO) Take 65 mg by mouth daily.      Marland Kitchen NITROSTAT 0.4 MG SL tablet 500 each as needed.      Marland Kitchen omeprazole (PRILOSEC) 20 MG capsule Take 20 mg by mouth 2 (two) times daily. Twice daily before meals      . potassium chloride SA (K-DUR,KLOR-CON) 20 MEQ tablet Take 1 tablet (20 mEq  total) by mouth daily.  30 tablet  6  . pravastatin (PRAVACHOL) 40 MG tablet Take 40 mg by mouth daily.      Marland Kitchen tiotropium (SPIRIVA) 18 MCG inhalation capsule Place 18 mcg into inhaler and inhale daily.      Marland Kitchen torsemide (DEMADEX) 20 MG tablet Take 1 tablet (20 mg total) by mouth 2 (two) times daily.  60 tablet  3  . glipiZIDE (GLUCOTROL XL) 2.5 MG 24 hr tablet Take 1 tablet (2.5 mg total) by mouth daily.  30 tablet  6    Review of Systems  Constitutional: Negative for fever, chills, activity change, appetite change, fatigue and unexpected weight change.  Eyes: Negative for visual disturbance.  Respiratory: Negative for cough and shortness of breath.   Cardiovascular: Negative for chest pain, palpitations and leg swelling.  Gastrointestinal: Negative for abdominal pain and abdominal distention.  Genitourinary: Negative for dysuria, urgency and difficulty urinating.  Musculoskeletal: Positive for myalgias. Negative for arthralgias and gait problem.  Skin: Negative for color change and rash.  Hematological: Negative for adenopathy.  Psychiatric/Behavioral: Negative for sleep disturbance and dysphoric mood. The patient is not nervous/anxious.        Objective:   Physical Exam  Constitutional: He is oriented to person, place, and time. He appears well-developed and well-nourished. No distress.  HENT:  Head: Normocephalic and atraumatic.  Right Ear: External ear normal.  Left Ear: External ear normal.  Nose: Nose normal.  Mouth/Throat: Oropharynx is clear and moist. No oropharyngeal exudate.  Eyes: Conjunctivae normal and EOM are normal. Pupils are equal, round, and reactive to light. Right eye exhibits no discharge. Left eye exhibits no discharge. No scleral icterus.  Neck: Normal range of motion. Neck supple. No tracheal deviation present. No thyromegaly present.  Cardiovascular: Normal rate, regular rhythm and normal heart sounds.  Exam reveals no gallop and no friction rub.   No murmur  heard. Pulmonary/Chest: Effort normal and breath sounds normal. No respiratory distress. He has no wheezes. He has no rales. He exhibits no tenderness.  Musculoskeletal: Normal range of motion. He exhibits no edema.  Lymphadenopathy:    He has no cervical adenopathy.  Neurological: He is alert and oriented to person, place, and time. No cranial nerve deficit. Coordination normal.  Skin: Skin is warm and dry. No rash noted. He is not diaphoretic. No erythema. No pallor.  Psychiatric: He has a normal mood and affect. His behavior is normal. Judgment and thought content normal.          Assessment & Plan:

## 2012-04-26 NOTE — Assessment & Plan Note (Signed)
Skin lesion of left knee is persistent despite biopsy by dermatology. Patient's daughter will schedule followup with dermatology for repeat evaluation.

## 2012-04-26 NOTE — Assessment & Plan Note (Signed)
Laceration has completely healed.

## 2012-04-26 NOTE — Assessment & Plan Note (Signed)
Blood sugars very poorly controlled recently with marked increase in A1c to 15%. Prolonged discussion with patient about this today. Suspect increase in blood sugars related to consumption of orange juice. Also question whether his glucometer may be inaccurate. He will get a new glucometer and check blood sugars at least twice daily. We will start glipizide 2.5 mg daily. He will followup in 2-4 weeks and bring record of blood sugars to that visit.

## 2012-04-27 ENCOUNTER — Telehealth: Payer: Self-pay | Admitting: Internal Medicine

## 2012-04-27 NOTE — Telephone Encounter (Signed)
OK. We can call in One Touch Ultra glucometer with test strips #100 and lancets #100 with 11 refills on both. Dx 250.00

## 2012-04-27 NOTE — Telephone Encounter (Signed)
Patient's insurance will cover the one touch ultra . They need to know how many times a day he needs to check his sugar's , # of strips and Dx. Code. Faxed to [facility].

## 2012-04-30 MED ORDER — ONETOUCH ULTRA SYSTEM W/DEVICE KIT: 1.0000 | PACK | Freq: Once | Status: DC

## 2012-04-30 MED ORDER — GLUCOSE BLOOD VI STRP: ORAL_STRIP | Status: DC

## 2012-04-30 NOTE — Telephone Encounter (Signed)
Rx called to pharmacy as instructed. 

## 2012-04-30 NOTE — Telephone Encounter (Signed)
He should check blood sugars twice daily.

## 2012-04-30 NOTE — Telephone Encounter (Signed)
Pharmacy needs to know how often patient checks his sugar.

## 2012-05-02 ENCOUNTER — Telehealth: Payer: Self-pay | Admitting: Internal Medicine

## 2012-05-02 ENCOUNTER — Ambulatory Visit (INDEPENDENT_AMBULATORY_CARE_PROVIDER_SITE_OTHER): Payer: Medicare Other | Admitting: Internal Medicine

## 2012-05-02 ENCOUNTER — Emergency Department: Payer: Self-pay

## 2012-05-02 DIAGNOSIS — E119 Type 2 diabetes mellitus without complications: Secondary | ICD-10-CM

## 2012-05-02 DIAGNOSIS — E1165 Type 2 diabetes mellitus with hyperglycemia: Secondary | ICD-10-CM

## 2012-05-02 LAB — COMPREHENSIVE METABOLIC PANEL
Albumin: 4.2 g/dL (ref 3.4–5.0)
Alkaline Phosphatase: 107 U/L (ref 50–136)
Anion Gap: 6 — ABNORMAL LOW (ref 7–16)
Bilirubin,Total: 0.7 mg/dL (ref 0.2–1.0)
Calcium, Total: 9 mg/dL (ref 8.5–10.1)
EGFR (Non-African Amer.): 44 — ABNORMAL LOW
Osmolality: 286 (ref 275–301)
SGOT(AST): 25 U/L (ref 15–37)
Sodium: 131 mmol/L — ABNORMAL LOW (ref 136–145)
Total Protein: 7.6 g/dL (ref 6.4–8.2)

## 2012-05-02 LAB — CBC
HCT: 42.8 % (ref 40.0–52.0)
HGB: 14.9 g/dL (ref 13.0–18.0)
MCH: 30.3 pg (ref 26.0–34.0)
MCV: 87 fL (ref 80–100)
Platelet: 137 10*3/uL — ABNORMAL LOW (ref 150–440)
RBC: 4.91 10*6/uL (ref 4.40–5.90)
RDW: 14 % (ref 11.5–14.5)

## 2012-05-02 LAB — URINALYSIS, COMPLETE
Glucose,UR: >=500 mg/dL (ref 0–75)
Ketone: NEGATIVE
Nitrite: NEGATIVE
Ph: 5 (ref 4.5–8.0)
Squamous Epithelial: <1
WBC UR: 3 /HPF (ref 0–5)

## 2012-05-02 NOTE — Telephone Encounter (Signed)
Patient Information:  Caller Name: Matthew Miles  Phone: [phone]  Patient: Matthew Miles  Gender: Male  DOB: 01-Jun-1928  Age: 79 Years  PCP: Ronna Polio (Adults only)  Office Follow Up:  Does the office need to follow up with this patient?: Yes  Instructions For The Office: Patient is requesting to increase glipizide.  Has finally gotten glucose down so that it will register on glucometer.  AM blood sugar was 420 per daughter.  Has dropped bread from diet.  Is in need of diabetic education.  Is not taking guidance from family well.  RN Note:  Daughter is calling for father.  Blood sugar this morning was 500 +; before breakfast 420+ .. previous to starting medication patient was not registering on glucometer so with medication and cutting out breads it has begun to get measurements.  Father states he is feeling better, but feels like he needs to increase the Glipizide.  He is up and doing regular routine.  Denies emergent symptoms and although triage for blood sugar over 500 is and ED disposition - to register numbers is an improvement since beginning of treatment.  Last A1C was 15.  Symptoms  Reason For Call & Symptoms: Was started on medication for elevated blood sugars and its not doing much.  Already has an appointment to return for an evaluation on 05/21/12.  Started 04/26/02 started  Reviewed Health History In EMR: Yes  Reviewed Medications In EMR: Yes  Reviewed Allergies In EMR: Yes  Reviewed Surgeries / Procedures: Yes  Date of Onset of Symptoms: 04/26/2012  Treatments Tried: Medication and is beginning to come down - blood sugar  Treatments Tried Worked: Yes  Guideline(s) Used:  Diabetes - High Blood Sugar  Disposition Per Guideline:   Discuss with PCP and Callback by Nurse within 1 Hour  Reason For Disposition Reached:   Blood glucose > 400 mg/dl (22 mmol/l)  Advice Given:  Call Back If:  You become worse.  RN Overrode Recommendation:  Patient Requests  Prescription  Although blood sugar is high enough for ED disposition (>500 after breakfast),since the onset of treatment with medication and removal of bread from diet has improved registering levels where before level was so high device would not show anything.  Prior to breakfast blood sugar was 420 per daughter.  Is requesting to increase medication. No emergent symptoms assessed outside of blood sugar level

## 2012-05-02 NOTE — Telephone Encounter (Signed)
An increase in Glipizide is very unlikely to fix blood sugar that is this elevated. I would recommend that we see him today. We have a 2pm opening. We will need to start insulin. If he cannot make this, he will need to go to the ED.

## 2012-05-02 NOTE — Progress Notes (Signed)
  Subjective:    Patient ID: Matthew Miles, male    DOB: Oct 27, 1928, 79 y.o.   MRN: [id number]  HPI Nurse only   Review of Systems     Objective:   Physical Exam        Assessment & Plan:

## 2012-05-02 NOTE — Telephone Encounter (Signed)
Spoke to patient's daughter and was advised that his meter has been showing high for several days. After speaking with Dr. Dan Humphreys patient's daughter Britta Mccreedy) was advised that patient needs to go to the ER for treatment.

## 2012-05-03 ENCOUNTER — Telehealth: Payer: Self-pay | Admitting: Internal Medicine

## 2012-05-03 NOTE — Telephone Encounter (Signed)
Can we get notes from the ED and then try to set up a follow up tomorrow?

## 2012-05-03 NOTE — Telephone Encounter (Signed)
Received ER records from [facility]. Appointment scheduled for Friday 05/05/11 at 1:00 per Dr. Dan Humphreys. Called and advised patient's daughter of this and she will bring him for his appointment. Was advised by Britta Mccreedy that patient was given 2 bags of fluid and insulin before he left the ER last night.

## 2012-05-03 NOTE — Telephone Encounter (Signed)
Faxed request over to [facility] to get patient's recent ER notes.

## 2012-05-03 NOTE — Telephone Encounter (Signed)
Daughter called to schedule an appointment with Dr. Dan Humphreys to be seen today . Dr. Dan Humphreys does not have any available slots . I did offer an appointment with the nurse practitioner, but the daughter wants him to be seen by Dr. Dan Humphreys because of his elevated blood sugar and they need something done to get it down . He was seen at the ER on 1.22.14 but he was sent home to follow up with his doctor today.

## 2012-05-04 ENCOUNTER — Ambulatory Visit (INDEPENDENT_AMBULATORY_CARE_PROVIDER_SITE_OTHER): Payer: Medicare Other | Admitting: Internal Medicine

## 2012-05-04 ENCOUNTER — Encounter: Payer: Self-pay | Admitting: Internal Medicine

## 2012-05-04 VITALS — BP 130/64 | HR 96 | Temp 98.1°F | Resp 16 | Wt 178.5 lb

## 2012-05-04 DIAGNOSIS — R300 Dysuria: Secondary | ICD-10-CM

## 2012-05-04 DIAGNOSIS — R739 Hyperglycemia, unspecified: Secondary | ICD-10-CM

## 2012-05-04 DIAGNOSIS — E1165 Type 2 diabetes mellitus with hyperglycemia: Secondary | ICD-10-CM

## 2012-05-04 DIAGNOSIS — R7309 Other abnormal glucose: Secondary | ICD-10-CM

## 2012-05-04 DIAGNOSIS — N390 Urinary tract infection, site not specified: Secondary | ICD-10-CM | POA: Insufficient documentation

## 2012-05-04 LAB — POCT URINALYSIS DIPSTICK
Bilirubin, UA: NEGATIVE
Glucose, UA: 500
Spec Grav, UA: 1.02
pH, UA: 5

## 2012-05-04 MED ORDER — CIPROFLOXACIN HCL 250 MG PO TABS: 250.0000 mg | ORAL_TABLET | Freq: Two times a day (BID) | ORAL | Status: DC

## 2012-05-04 MED ORDER — GLIPIZIDE ER 5 MG PO TB24: 5.0000 mg | ORAL_TABLET | Freq: Every day | ORAL | Status: DC

## 2012-05-04 NOTE — Assessment & Plan Note (Signed)
Urinalysis today remarkable for leukocytes. UA in the ED earlier this week pos for LE, blood - not treated. Pt notes some burning with urination. No fever, chills, flank pain. Will send urine for culture. Will treat with Cipro bid x 1 week.

## 2012-05-04 NOTE — Patient Instructions (Signed)

## 2012-05-04 NOTE — Assessment & Plan Note (Signed)
Recent BG elevated, s/p ED evaluation earlier this week for markedly elevated BG.  Pt working diligently on diet. Will set up diabetes education and reviewed importance of limiting intake of processed carbohydrates today. Will increase Glipizide to 5mg  daily. If no improvement with increase, will advance to 10mg  daily. We discussed that he may need insulin for management of DM. He is reluctant to start insulin, given that BG have been so well controlled in the past without medications. Follow up 2 weeks.

## 2012-05-04 NOTE — Progress Notes (Signed)
Subjective:    Patient ID: Matthew Miles, male    DOB: [birth year], 79 y.o.   MRN: [id number]  HPI 79 year old male with history of diabetes presents for followup. He was recently noted to have markedly elevated blood sugars. He was started on glipizide with no improvement. Earlier in the week, he had blood sugars greater than 600. He went to the ER for evaluation there he was given IV fluids and insulin. He was discharged home without change in medications. Since being home, he is limited intake of carbohydrates and has been taking glipizide on a regular basis. He reports that sugars have generally been between 250 and 350. He reports he is generally feeling better. He notes some burning with urination. Of note, in the ER he was noted to have hematuria and urinalysis positive for leukocyte esterase. He was not treated for this. He denies any fever, chills, flank pain.  Outpatient Encounter Prescriptions as of 05/04/2012  Medication Sig Dispense Refill  . albuterol (PROVENTIL) (2.5 MG/3ML) 0.083% nebulizer solution Take 2.5 mg by nebulization every 6 (six) hours as needed.      Marland Kitchen albuterol (VENTOLIN HFA) 108 (90 BASE) MCG/ACT inhaler Inhale 2 puffs into the lungs every 4 (four) hours as needed.      Marland Kitchen aspirin 81 MG tablet Take 81 mg by mouth daily.      . Blood Glucose Monitoring Suppl (ONE TOUCH ULTRA SYSTEM KIT) W/DEVICE KIT 1 kit by Does not apply route once.  1 each  0  . Ferrous Sulfate (IRON SUPPLEMENT PO) Take 65 mg by mouth daily.      Marland Kitchen glipiZIDE (GLUCOTROL XL) 5 MG 24 hr tablet Take 1 tablet (5 mg total) by mouth daily.  30 tablet  6  . glucose blood test strip One touch ultra test strips. Use to check blood sugar two times a day. Dx. 250.00  100 each  11  . NITROSTAT 0.4 MG SL tablet 500 each as needed.      Marland Kitchen omeprazole (PRILOSEC) 20 MG capsule Take 20 mg by mouth 2 (two) times daily. Twice daily before meals      . ONE TOUCH LANCETS MISC One touch ultra lancets. Use two times a day Dx  250.00      . potassium chloride SA (K-DUR,KLOR-CON) 20 MEQ tablet Take 1 tablet (20 mEq total) by mouth daily.  30 tablet  6  . pravastatin (PRAVACHOL) 40 MG tablet Take 40 mg by mouth daily.      Marland Kitchen tiotropium (SPIRIVA) 18 MCG inhalation capsule Place 18 mcg into inhaler and inhale daily.      Marland Kitchen torsemide (DEMADEX) 20 MG tablet Take 1 tablet (20 mg total) by mouth 2 (two) times daily.  60 tablet  3  . [DISCONTINUED] glipiZIDE (GLUCOTROL XL) 2.5 MG 24 hr tablet Take 1 tablet (2.5 mg total) by mouth daily.  30 tablet  6  . ciprofloxacin (CIPRO) 250 MG tablet Take 1 tablet (250 mg total) by mouth 2 (two) times daily.  28 tablet  0   BP 130/64  Pulse 96  Temp 98.1 F (36.7 C) (Oral)  Resp 16  Wt 178 lb 8 oz (80.967 kg)  SpO2 98%  Review of Systems  Constitutional: Negative for fever, chills, activity change, appetite change, fatigue and unexpected weight change.  Eyes: Negative for visual disturbance.  Respiratory: Negative for cough and shortness of breath.   Cardiovascular: Negative for chest pain, palpitations and leg swelling.  Gastrointestinal: Negative for abdominal pain  and abdominal distention.  Genitourinary: Positive for dysuria. Negative for urgency, hematuria, flank pain and difficulty urinating.  Musculoskeletal: Negative for arthralgias and gait problem.  Skin: Negative for color change and rash.  Hematological: Negative for adenopathy.  Psychiatric/Behavioral: Negative for sleep disturbance and dysphoric mood. The patient is not nervous/anxious.        Objective:   Physical Exam  Constitutional: He is oriented to person, place, and time. He appears well-developed and well-nourished. No distress.  HENT:  Head: Normocephalic and atraumatic.  Right Ear: External ear normal.  Left Ear: External ear normal.  Nose: Nose normal.  Mouth/Throat: Oropharynx is clear and moist. No oropharyngeal exudate.  Eyes: Conjunctivae normal and EOM are normal. Pupils are equal, round,  and reactive to light. Right eye exhibits no discharge. Left eye exhibits no discharge. No scleral icterus.  Neck: Normal range of motion. Neck supple. No tracheal deviation present. No thyromegaly present.  Cardiovascular: Normal rate, regular rhythm and normal heart sounds.  Exam reveals no gallop and no friction rub.   No murmur heard. Pulmonary/Chest: Effort normal and breath sounds normal. No respiratory distress. He has no wheezes. He has no rales. He exhibits no tenderness.  Musculoskeletal: Normal range of motion. He exhibits no edema.  Lymphadenopathy:    He has no cervical adenopathy.  Neurological: He is alert and oriented to person, place, and time. No cranial nerve deficit. Coordination normal.  Skin: Skin is warm and dry. No rash noted. He is not diaphoretic. No erythema. No pallor.  Psychiatric: He has a normal mood and affect. His behavior is normal. Judgment and thought content normal.          Assessment & Plan:

## 2012-05-06 LAB — URINE CULTURE

## 2012-05-07 ENCOUNTER — Telehealth: Payer: Self-pay | Admitting: Internal Medicine

## 2012-05-07 NOTE — Telephone Encounter (Signed)
Patient notified as instructed by telephone. 

## 2012-05-07 NOTE — Telephone Encounter (Signed)
Spoke to patient and was advised that today his FBS was 286, after eating 284, Sunday FBS 268, Saturday FBS 233 and then mid-day 345. Please advise.

## 2012-05-07 NOTE — Telephone Encounter (Signed)
Can they give [place] an idea where blood sugars are running? Based on his report Friday, they were slightly improved.

## 2012-05-07 NOTE — Telephone Encounter (Signed)
Spoke to patient's daughter and was advised that patient is taking Glipizide 2.5 mg, two daily until he gets the new script filled for 5 mg. Britta Mccreedy wants to know if is okay that his blood sugar is not any lower at this point?  Please advise.

## 2012-05-07 NOTE — Telephone Encounter (Signed)
Those are much much improved compared to previous. I would continue with the 5mg  dose for now and then we can see how the blood sugars looks at his follow up.

## 2012-05-07 NOTE — Telephone Encounter (Signed)
Seen last week for his sugar.  Daughter states sugars are still too high in the 260's+.  Wants to make sure that the pt is taking his meds correctly.  Please advise.  She tried to leave message with triage but it would not take the number she put in.

## 2012-05-08 ENCOUNTER — Other Ambulatory Visit: Payer: Medicare Other

## 2012-05-08 VITALS — BP 132/62 | HR 81 | Temp 98.2°F

## 2012-05-08 DIAGNOSIS — N390 Urinary tract infection, site not specified: Secondary | ICD-10-CM

## 2012-05-10 LAB — URINE CULTURE: Colony Count: 50000

## 2012-05-11 NOTE — Progress Notes (Signed)
Spoke with patient daughter Britta Mccreedy and she stated he is feeling a little better. Also his BS numbers has improved. He will be back in Monday or one day next week to repeat urine.

## 2012-05-14 ENCOUNTER — Other Ambulatory Visit (INDEPENDENT_AMBULATORY_CARE_PROVIDER_SITE_OTHER): Payer: Medicare Other

## 2012-05-14 DIAGNOSIS — N390 Urinary tract infection, site not specified: Secondary | ICD-10-CM

## 2012-05-14 LAB — POCT URINALYSIS DIPSTICK
Bilirubin, UA: NEGATIVE
Glucose, UA: NEGATIVE
Ketones, UA: NEGATIVE
Nitrite, UA: NEGATIVE
Spec Grav, UA: 1.01

## 2012-05-15 ENCOUNTER — Other Ambulatory Visit: Payer: Self-pay | Admitting: Internal Medicine

## 2012-05-15 ENCOUNTER — Other Ambulatory Visit: Payer: Medicare Other

## 2012-05-15 DIAGNOSIS — N390 Urinary tract infection, site not specified: Secondary | ICD-10-CM

## 2012-05-18 ENCOUNTER — Telehealth: Payer: Self-pay | Admitting: Internal Medicine

## 2012-05-18 NOTE — Telephone Encounter (Signed)
This should not wait until Monday. If he is not able to retract the foreskin and it is obstructing urine outflow, then he needs to be seen today. Ideally, this would be by urology, but, at this point he will probably need to go the ED, where they can place a catheter to allow for urine to flow.

## 2012-05-18 NOTE — Telephone Encounter (Signed)
Pt son called Matthew Miles)  And wanted to let you know mr Matthew Miles didn't not how to bring this up to male dr Pt is having trouble urinating.  His foreskin is closing about open.  Son stated he has an apointment with dr cope 2/24 Pt has appointment with dr walker on Monday  Son stated this could wait till Monday to discuss

## 2012-05-18 NOTE — Telephone Encounter (Signed)
Spoke with patient and he will be seeing [facility] today

## 2012-05-19 LAB — URINE CULTURE: Colony Count: 40000

## 2012-05-21 ENCOUNTER — Ambulatory Visit: Payer: Medicare Other | Admitting: Internal Medicine

## 2012-06-04 DIAGNOSIS — R339 Retention of urine, unspecified: Secondary | ICD-10-CM | POA: Insufficient documentation

## 2012-06-04 DIAGNOSIS — IMO0001 Reserved for inherently not codable concepts without codable children: Secondary | ICD-10-CM | POA: Insufficient documentation

## 2012-06-04 DIAGNOSIS — Z8546 Personal history of malignant neoplasm of prostate: Secondary | ICD-10-CM | POA: Insufficient documentation

## 2012-06-04 DIAGNOSIS — N476 Balanoposthitis: Secondary | ICD-10-CM | POA: Insufficient documentation

## 2012-06-06 ENCOUNTER — Encounter: Payer: Self-pay | Admitting: Cardiovascular Disease

## 2012-06-06 ENCOUNTER — Ambulatory Visit (INDEPENDENT_AMBULATORY_CARE_PROVIDER_SITE_OTHER): Payer: Medicare Other | Admitting: Cardiovascular Disease

## 2012-06-06 VITALS — BP 142/60 | HR 83 | Ht 62.0 in | Wt 181.8 lb

## 2012-06-06 DIAGNOSIS — I5030 Unspecified diastolic (congestive) heart failure: Secondary | ICD-10-CM

## 2012-06-06 DIAGNOSIS — I509 Heart failure, unspecified: Secondary | ICD-10-CM

## 2012-06-06 DIAGNOSIS — E1165 Type 2 diabetes mellitus with hyperglycemia: Secondary | ICD-10-CM

## 2012-06-06 DIAGNOSIS — I272 Pulmonary hypertension, unspecified: Secondary | ICD-10-CM

## 2012-06-06 DIAGNOSIS — I10 Essential (primary) hypertension: Secondary | ICD-10-CM

## 2012-06-06 DIAGNOSIS — I2510 Atherosclerotic heart disease of native coronary artery without angina pectoris: Secondary | ICD-10-CM

## 2012-06-06 DIAGNOSIS — E7800 Pure hypercholesterolemia, unspecified: Secondary | ICD-10-CM

## 2012-06-06 DIAGNOSIS — I2789 Other specified pulmonary heart diseases: Secondary | ICD-10-CM

## 2012-06-06 NOTE — Assessment & Plan Note (Signed)
Moderate pulmonary hypertension early 2013. Seems to have improved and stable on diuretics, goal weight in our clinic is low 180 if not high 170 pounds

## 2012-06-06 NOTE — Patient Instructions (Addendum)
You are doing well. No medication changes were made.  Please call us if you have new issues that need to be addressed before your next appt.  Your physician wants you to follow-up in: 6 months.  You will receive a reminder letter in the mail two months in advance. If you don't receive a letter, please call our office to schedule the follow-up appointment.   

## 2012-06-06 NOTE — Assessment & Plan Note (Signed)
Blood pressure is well controlled on today's visit. No changes made to the medications. 

## 2012-06-06 NOTE — Assessment & Plan Note (Signed)
Challenging time recently with poorly controlled sugars despite good dietary efforts. Managed by Dr. Dan Humphreys.

## 2012-06-06 NOTE — Assessment & Plan Note (Signed)
No recent lab available for review. Goal LDL less than 70.

## 2012-06-06 NOTE — Progress Notes (Signed)
Patient ID: Matthew Miles, male    DOB: [birth year], 79 y.o.   MRN: [id number]  HPI Comments: Matthew Miles is a 79 year old gentleman with history of coronary artery disease, moderate pulmonary hypertension in early 2013, previous MI with occluded LAD, long history of smoking for 60 years, history of bronchitis requiring several courses of antibiotics at the end of 2012, prednisone and nebulizers with admission to the hospital in December for hematemesis and gastric ulcer seen on EGD, found to have COPD and oxygenation down to 83% with ambulation, nonsustained VT per the notes, acute renal failure who presents for followup.  Recent left and right heart cath done in February 2013 showing pulmonary hypertension, stable severe coronary artery disease as below. Normal ejection fraction  Right heart catheterization showed significant fluid overload with wedge pressure of 24, moderate pulmonary hypertension. Catheterization showed occluded LAD which was old, no other significant stenoses requiring intervention.  He reports that weight has been stable in the low 180 range. Prior weights when in heart failure were 190.   bystolic (20 mg) held in the past as he reports this caused fluid retention and edema earlier this year (prior to starting torsemide). Continues to have mild chronic shortness of breath. Trying to lose weight. Minimal edema. Diabetes has been poorly controlled acutely over the past several months. He has been closely watching his diet  EKG shows normal sinus rhythm with rate 83 beats per minute with APCs, left anterior fascicular block, old anterior infarct       Outpatient Encounter Prescriptions as of 77/26/2014  Medication Sig Dispense Refill  . albuterol (PROVENTIL) (2.5 MG/3ML) 0.083% nebulizer solution Take 2.5 mg by nebulization every 6 (six) hours as needed.      Marland Kitchen albuterol (VENTOLIN HFA) 108 (90 BASE) MCG/ACT inhaler Inhale 2 puffs into the lungs every 4 (four) hours as  needed.      Marland Kitchen aspirin 81 MG tablet Take 81 mg by mouth daily.      . Blood Glucose Monitoring Suppl (ONE TOUCH ULTRA SYSTEM KIT) W/DEVICE KIT 1 kit by Does not apply route once.  1 each  0  . Ferrous Sulfate (IRON SUPPLEMENT PO) Take 65 mg by mouth daily.      Marland Kitchen glipiZIDE (GLUCOTROL XL) 5 MG 24 hr tablet Take 1 tablet (5 mg total) by mouth daily.  30 tablet  6  . glucose blood test strip One touch ultra test strips. Use to check blood sugar two times a day. Dx. 250.00  100 each  11  . NITROSTAT 0.4 MG SL tablet 500 each as needed.      Marland Kitchen omeprazole (PRILOSEC) 20 MG capsule Take 20 mg by mouth 2 (two) times daily. Twice daily before meals      . potassium chloride SA (K-DUR,KLOR-CON) 20 MEQ tablet Take 1 tablet (20 mEq total) by mouth daily.  30 tablet  6  . pravastatin (PRAVACHOL) 40 MG tablet Take 40 mg by mouth daily.      Marland Kitchen tiotropium (SPIRIVA) 18 MCG inhalation capsule Place 18 mcg into inhaler and inhale daily.      Marland Kitchen torsemide (DEMADEX) 20 MG tablet Take 1 tablet (20 mg total) by mouth 2 (two) times daily.  60 tablet  3  . [DISCONTINUED] ciprofloxacin (CIPRO) 250 MG tablet Take 1 tablet (250 mg total) by mouth 2 (two) times daily.  28 tablet  0  . [DISCONTINUED] ONE TOUCH LANCETS MISC One touch ultra lancets. Use two times a day Dx 250.00  Review of Systems  HENT: Negative.   Eyes: Negative.   Respiratory: Positive for shortness of breath.   Gastrointestinal: Negative.   Musculoskeletal: Negative.   Skin: Negative.   Neurological: Negative.   Psychiatric/Behavioral: Negative.   All other systems reviewed and are negative.   BP 142/60  Pulse 83  Ht 5\' 2"  (1.575 m)  Wt 181 lb 12 oz (82.441 kg)  BMI 33.23 kg/m2  Physical Exam  Nursing note and vitals reviewed. Constitutional: He is oriented to person, place, and time. He appears well-developed and well-nourished.  HENT:  Head: Normocephalic.  Nose: Nose normal.  Mouth/Throat: Oropharynx is clear and moist.  Eyes:  Conjunctivae are normal. Pupils are equal, round, and reactive to light.  Neck: Normal range of motion. Neck supple. No JVD present.  Cardiovascular: Normal rate, regular rhythm, S1 normal, S2 normal and intact distal pulses.  Frequent extrasystoles are present. Exam reveals no gallop and no friction rub.   Murmur heard.  Systolic murmur is present with a grade of 2/6  Trace edema around the ankles  Pulmonary/Chest: Effort normal and breath sounds normal. No respiratory distress. He has no wheezes. He has no rales. He exhibits no tenderness.  Abdominal: Soft. Bowel sounds are normal. He exhibits no distension. There is no tenderness.  Musculoskeletal: Normal range of motion. He exhibits edema. He exhibits no tenderness.  Lymphadenopathy:    He has no cervical adenopathy.  Neurological: He is alert and oriented to person, place, and time. Coordination normal.  Skin: Skin is warm and dry. No rash noted. No erythema.  Psychiatric: He has a normal mood and affect. His behavior is normal. Judgment and thought content normal.      Assessment and Plan

## 2012-06-06 NOTE — Assessment & Plan Note (Signed)
Currently with no symptoms of angina. No further workup at this time. Continue current medication regimen. 

## 2012-06-06 NOTE — Assessment & Plan Note (Signed)
Doing well on torsemide twice a day. We have suggested he continue this regimen

## 2012-06-08 ENCOUNTER — Encounter: Payer: Self-pay | Admitting: Internal Medicine

## 2012-06-08 ENCOUNTER — Ambulatory Visit (INDEPENDENT_AMBULATORY_CARE_PROVIDER_SITE_OTHER): Payer: Medicare Other | Admitting: Internal Medicine

## 2012-06-08 VITALS — BP 128/60 | HR 80 | Temp 98.2°F | Wt 181.0 lb

## 2012-06-08 DIAGNOSIS — E1165 Type 2 diabetes mellitus with hyperglycemia: Secondary | ICD-10-CM

## 2012-06-08 DIAGNOSIS — N472 Paraphimosis: Secondary | ICD-10-CM

## 2012-06-08 DIAGNOSIS — E785 Hyperlipidemia, unspecified: Secondary | ICD-10-CM

## 2012-06-08 DIAGNOSIS — K219 Gastro-esophageal reflux disease without esophagitis: Secondary | ICD-10-CM

## 2012-06-08 DIAGNOSIS — N478 Other disorders of prepuce: Secondary | ICD-10-CM

## 2012-06-08 DIAGNOSIS — Z8546 Personal history of malignant neoplasm of prostate: Secondary | ICD-10-CM

## 2012-06-08 MED ORDER — ONETOUCH ULTRA SYSTEM W/DEVICE KIT
1.0000 | PACK | Freq: Once | Status: DC
Start: 2012-06-08 — End: 2013-05-13

## 2012-06-08 MED ORDER — PRAVASTATIN SODIUM 40 MG PO TABS: 40.0000 mg | ORAL_TABLET | Freq: Every day | ORAL | Status: DC

## 2012-06-08 MED ORDER — GLUCOSE BLOOD VI STRP: ORAL_STRIP | Status: DC

## 2012-06-08 MED ORDER — OMEPRAZOLE 20 MG PO CPDR: 20.0000 mg | DELAYED_RELEASE_CAPSULE | Freq: Two times a day (BID) | ORAL | Status: DC

## 2012-06-08 NOTE — Assessment & Plan Note (Signed)
Symptoms well controlled with omeprazole. Will continue. 

## 2012-06-08 NOTE — Assessment & Plan Note (Signed)
Plan for circumcision on 06/19/2012 with Dr. Achilles Dunk.

## 2012-06-08 NOTE — Assessment & Plan Note (Signed)
BG much better controlled with use of glipizide and better compliance with diet.  Will plan to recheck A1c with labs in 07/2012.

## 2012-06-09 NOTE — Progress Notes (Signed)
Subjective:    Patient ID: Matthew Miles, male    DOB: March 18, 1929, 79 y.o.   MRN: [id number]  HPI 79YO male with DM, COPD, Pulm hypertension and recent diagnosis of paraphimosis presents for follow up.  DM - BG much better controlled. Pt has increased protein and decreased carbs. BG mostly below 150. Compliant with medications. No low BG or BG>250. Has not yet completed nutrition counseling.  Paraphimosis - Followed by urology. Scheduled for circumcision 3/11. No current issues with passing urine.  Otherwise, feeling well. No new concerns today.  Outpatient Encounter Prescriptions as of 06/08/2012  Medication Sig Dispense Refill  . aspirin 81 MG tablet Take 81 mg by mouth daily.      . Blood Glucose Monitoring Suppl (ONE TOUCH ULTRA SYSTEM KIT) W/DEVICE KIT 1 kit by Does not apply route once.  1 each  0  . Ferrous Sulfate (IRON SUPPLEMENT PO) Take 65 mg by mouth daily.      Marland Kitchen glipiZIDE (GLUCOTROL XL) 5 MG 24 hr tablet Take 1 tablet (5 mg total) by mouth daily.  30 tablet  6  . glucose blood test strip One touch ultra test strips. Use to check blood sugar two times a day. Dx. 250.00  100 each  11  . NITROSTAT 0.4 MG SL tablet 500 each as needed.      . nystatin-triamcinolone (MYCOLOG II) cream       . omeprazole (PRILOSEC) 20 MG capsule Take 1 capsule (20 mg total) by mouth 2 (two) times daily. Twice daily before meals  180 capsule  4  . potassium chloride SA (K-DUR,KLOR-CON) 20 MEQ tablet Take 1 tablet (20 mEq total) by mouth daily.  30 tablet  6  . pravastatin (PRAVACHOL) 40 MG tablet Take 1 tablet (40 mg total) by mouth daily.  90 tablet  4  . tiotropium (SPIRIVA) 18 MCG inhalation capsule Place 18 mcg into inhaler and inhale daily.      Marland Kitchen torsemide (DEMADEX) 20 MG tablet Take 1 tablet (20 mg total) by mouth 2 (two) times daily.  60 tablet  3  . [DISCONTINUED] Blood Glucose Monitoring Suppl (ONE TOUCH ULTRA SYSTEM KIT) W/DEVICE KIT 1 kit by Does not apply route once.  1 each  0  .  [DISCONTINUED] glucose blood test strip One touch ultra test strips. Use to check blood sugar two times a day. Dx. 250.00  100 each  11  . [DISCONTINUED] omeprazole (PRILOSEC) 20 MG capsule Take 20 mg by mouth 2 (two) times daily. Twice daily before meals      . [DISCONTINUED] pravastatin (PRAVACHOL) 40 MG tablet Take 40 mg by mouth daily.      Marland Kitchen albuterol (PROVENTIL) (2.5 MG/3ML) 0.083% nebulizer solution Take 2.5 mg by nebulization every 6 (six) hours as needed.      Marland Kitchen albuterol (VENTOLIN HFA) 108 (90 BASE) MCG/ACT inhaler Inhale 2 puffs into the lungs every 4 (four) hours as needed.       No facility-administered encounter medications on file as of 06/08/2012.   BP 128/60  Pulse 80  Temp(Src) 98.2 F (36.8 C) (Oral)  Wt 181 lb (82.101 kg)  BMI 33.1 kg/m2  SpO2 98%  Review of Systems  Constitutional: Negative for fever, chills, activity change, appetite change, fatigue and unexpected weight change.  Eyes: Negative for visual disturbance.  Respiratory: Negative for cough and shortness of breath.   Cardiovascular: Negative for chest pain, palpitations and leg swelling.  Gastrointestinal: Negative for abdominal pain and abdominal distention.  Genitourinary: Negative for dysuria, urgency and difficulty urinating.  Musculoskeletal: Negative for arthralgias and gait problem.  Skin: Negative for color change and rash.  Hematological: Negative for adenopathy.  Psychiatric/Behavioral: Negative for sleep disturbance and dysphoric mood. The patient is not nervous/anxious.        Objective:   Physical Exam  Constitutional: He is oriented to person, place, and time. He appears well-developed and well-nourished. No distress.  HENT:  Head: Normocephalic and atraumatic.  Right Ear: External ear normal.  Left Ear: External ear normal.  Nose: Nose normal.  Mouth/Throat: Oropharynx is clear and moist. No oropharyngeal exudate.  Eyes: Conjunctivae and EOM are normal. Pupils are equal, round, and  reactive to light. Right eye exhibits no discharge. Left eye exhibits no discharge. No scleral icterus.  Neck: Normal range of motion. Neck supple. No tracheal deviation present. No thyromegaly present.  Cardiovascular: Normal rate, regular rhythm and normal heart sounds.  Exam reveals no gallop and no friction rub.   No murmur heard. Pulmonary/Chest: Effort normal and breath sounds normal. No respiratory distress. He has no wheezes. He has no rales. He exhibits no tenderness.  Musculoskeletal: Normal range of motion. He exhibits no edema.  Lymphadenopathy:    He has no cervical adenopathy.  Neurological: He is alert and oriented to person, place, and time. No cranial nerve deficit. Coordination normal.  Skin: Skin is warm and dry. No rash noted. He is not diaphoretic. No erythema. No pallor.  Psychiatric: He has a normal mood and affect. His behavior is normal. Judgment and thought content normal.          Assessment & Plan:

## 2012-06-13 ENCOUNTER — Ambulatory Visit: Payer: Self-pay | Admitting: Urology

## 2012-06-13 LAB — CBC
HGB: 12.1 g/dL (ref 12.0–16.0)
MCH: 29.8 pg (ref 26.0–34.0)
MCHC: 34.1 g/dL (ref 32.0–36.0)
MCV: 88 fL (ref 80–100)
RBC: 4.06 10*6/uL — ABNORMAL LOW (ref 4.40–5.90)
WBC: 6.3 10*3/uL (ref 3.8–10.6)

## 2012-06-13 LAB — BASIC METABOLIC PANEL
Anion Gap: 4 — ABNORMAL LOW (ref 7–16)
Calcium, Total: 8.3 mg/dL — ABNORMAL LOW (ref 8.5–10.1)
Chloride: 104 mmol/L (ref 98–107)
EGFR (Non-African Amer.): 55 — ABNORMAL LOW
Glucose: 178 mg/dL — ABNORMAL HIGH (ref 65–99)
Osmolality: 283 (ref 275–301)
Potassium: 3.8 mmol/L (ref 3.5–5.1)

## 2012-06-14 ENCOUNTER — Telehealth: Payer: Self-pay | Admitting: *Deleted

## 2012-06-14 MED ORDER — ALBUTEROL SULFATE HFA 108 (90 BASE) MCG/ACT IN AERS: 2.0000 | INHALATION_SPRAY | RESPIRATORY_TRACT | Status: DC | PRN

## 2012-06-14 NOTE — Telephone Encounter (Signed)
Script for albuterol filled

## 2012-06-18 NOTE — Telephone Encounter (Signed)
Patient daughter left message stating pharmacy did not have his Albuterol. Rx called in to pharmacy this time.

## 2012-06-19 ENCOUNTER — Ambulatory Visit: Payer: Self-pay | Admitting: Urology

## 2012-06-20 LAB — PATHOLOGY REPORT

## 2012-06-22 ENCOUNTER — Telehealth: Payer: Self-pay | Admitting: *Deleted

## 2012-06-22 NOTE — Telephone Encounter (Signed)
lmtcb

## 2012-06-22 NOTE — Telephone Encounter (Signed)
Pt daughter calling wanting to know if pt can increase his lasix due to surgery last week and he is retaining fluid.

## 2012-06-22 NOTE — Telephone Encounter (Signed)
Pt had surgery this past Tuesday \\since  d/c he has noticed weight gain of 3-4 pounds, edema in LE Denies worsening sob Taking Demadex 20 mg PO BID I advised ok to try taking an extra torsemide today and tomm to see if this helps

## 2012-06-25 ENCOUNTER — Telehealth: Payer: Self-pay

## 2012-06-25 NOTE — Telephone Encounter (Signed)
Pt reports edema and weight have improved with extra torsemide He will resume usual dose of torsemide qd and will let us know should edema or weight gain occur

## 2012-06-25 NOTE — Telephone Encounter (Signed)
Assess symptoms

## 2012-07-01 ENCOUNTER — Other Ambulatory Visit: Payer: Self-pay | Admitting: Cardiovascular Disease

## 2012-07-02 DIAGNOSIS — N3941 Urge incontinence: Secondary | ICD-10-CM | POA: Insufficient documentation

## 2012-07-11 ENCOUNTER — Other Ambulatory Visit: Payer: Medicare Other

## 2012-07-11 ENCOUNTER — Other Ambulatory Visit (INDEPENDENT_AMBULATORY_CARE_PROVIDER_SITE_OTHER): Payer: Medicare Other

## 2012-07-11 DIAGNOSIS — E1165 Type 2 diabetes mellitus with hyperglycemia: Secondary | ICD-10-CM

## 2012-07-11 DIAGNOSIS — Z8546 Personal history of malignant neoplasm of prostate: Secondary | ICD-10-CM

## 2012-07-11 LAB — COMPREHENSIVE METABOLIC PANEL
ALT: 22 U/L (ref 0–53)
CO2: 28 mEq/L (ref 19–32)
Calcium: 8.5 mg/dL (ref 8.4–10.5)
Chloride: 104 mEq/L (ref 96–112)
Creatinine, Ser: 1 mg/dL (ref 0.4–1.5)
GFR: 75.24 mL/min (ref >60.00)
Glucose, Bld: 124 mg/dL — ABNORMAL HIGH (ref 70–99)
Total Bilirubin: 0.6 mg/dL (ref 0.3–1.2)

## 2012-07-11 LAB — HEMOGLOBIN A1C: Hgb A1c MFr Bld: 6.9 % — ABNORMAL HIGH (ref 4.6–6.5)

## 2012-07-16 ENCOUNTER — Ambulatory Visit (INDEPENDENT_AMBULATORY_CARE_PROVIDER_SITE_OTHER): Payer: Medicare Other | Admitting: Internal Medicine

## 2012-07-16 ENCOUNTER — Encounter: Payer: Self-pay | Admitting: Internal Medicine

## 2012-07-16 VITALS — BP 128/50 | HR 79 | Temp 98.2°F | Wt 181.0 lb

## 2012-07-16 DIAGNOSIS — D509 Iron deficiency anemia, unspecified: Secondary | ICD-10-CM

## 2012-07-16 DIAGNOSIS — E1165 Type 2 diabetes mellitus with hyperglycemia: Secondary | ICD-10-CM

## 2012-07-16 DIAGNOSIS — I10 Essential (primary) hypertension: Secondary | ICD-10-CM

## 2012-07-16 NOTE — Assessment & Plan Note (Addendum)
Remarkable improvement in blood sugars with decrease in A1c from 15% to 6.9%. Encouraged him to continue efforts at healthy diet, low in processed carbohydrates. Encouraged him to continue to monitor blood sugars several times per week. Per his daughter's request, will set up referral to endocrinology. Referral to diabetes education pending. Continue current medications.

## 2012-07-16 NOTE — Progress Notes (Signed)
Subjective:    Patient ID: Matthew Miles, male    DOB: Jul 12, 1928, 79 y.o.   MRN: [id number]  HPI 79 year old male with history of diabetes, hypertension presents for followup. He reports significant improvement in his diet with limitation of processed carbohydrates. Recent A1c was improved from 15% to 6.9%. Most of his blood sugars have been between 100-130. He occasionally has blood sugar elevated above 200 after eating sugar foods. He has not yet completed diabetes education. He is compliant with his medications. His daughter requests referral to endocrinology for further evaluation/management, as a family member had success with this.  Outpatient Encounter Prescriptions as of 07/16/2012  Medication Sig Dispense Refill  . albuterol (PROVENTIL) (2.5 MG/3ML) 0.083% nebulizer solution Take 2.5 mg by nebulization every 6 (six) hours as needed.      Marland Kitchen albuterol (VENTOLIN HFA) 108 (90 BASE) MCG/ACT inhaler Inhale 2 puffs into the lungs every 4 (four) hours as needed.  18 g  3  . aspirin 81 MG tablet Take 81 mg by mouth daily.      . Blood Glucose Monitoring Suppl (ONE TOUCH ULTRA SYSTEM KIT) W/DEVICE KIT 1 kit by Does not apply route once.  1 each  0  . Ferrous Sulfate (IRON SUPPLEMENT PO) Take 65 mg by mouth daily.      Marland Kitchen glipiZIDE (GLUCOTROL XL) 5 MG 24 hr tablet Take 1 tablet (5 mg total) by mouth daily.  30 tablet  6  . glucose blood test strip One touch ultra test strips. Use to check blood sugar two times a day. Dx. 250.00  100 each  11  . KLOR-CON M20 20 MEQ tablet TAKE 1 TABLET BY MOUTH EVERY DAY  30 tablet  6  . NITROSTAT 0.4 MG SL tablet 500 each as needed.      . nystatin-triamcinolone (MYCOLOG II) cream       . omeprazole (PRILOSEC) 20 MG capsule Take 1 capsule (20 mg total) by mouth 2 (two) times daily. Twice daily before meals  180 capsule  4  . pravastatin (PRAVACHOL) 40 MG tablet Take 1 tablet (40 mg total) by mouth daily.  90 tablet  4  . torsemide (DEMADEX) 20 MG tablet Take 1  tablet (20 mg total) by mouth 2 (two) times daily.  60 tablet  3  . HYDROcodone-acetaminophen (NORCO/VICODIN) 5-325 MG per tablet       . tiotropium (SPIRIVA) 18 MCG inhalation capsule Place 18 mcg into inhaler and inhale daily.       No facility-administered encounter medications on file as of 07/16/2012.   BP 128/50  Pulse 79  Temp(Src) 98.2 F (36.8 C) (Oral)  Wt 181 lb (82.101 kg)  BMI 33.1 kg/m2  SpO2 98%  Review of Systems  Constitutional: Negative for fever, chills, activity change, appetite change, fatigue and unexpected weight change.  Eyes: Negative for visual disturbance.  Respiratory: Negative for cough and shortness of breath.   Cardiovascular: Negative for chest pain, palpitations and leg swelling.  Gastrointestinal: Negative for abdominal pain and abdominal distention.  Genitourinary: Negative for dysuria, urgency and difficulty urinating.  Musculoskeletal: Negative for arthralgias and gait problem.  Skin: Negative for color change and rash.  Hematological: Negative for adenopathy.  Psychiatric/Behavioral: Negative for sleep disturbance and dysphoric mood. The patient is not nervous/anxious.        Objective:   Physical Exam  Constitutional: He is oriented to person, place, and time. He appears well-developed and well-nourished. No distress.  HENT:  Head: Normocephalic and atraumatic.  Right Ear: External ear normal.  Left Ear: External ear normal.  Nose: Nose normal.  Mouth/Throat: Oropharynx is clear and moist. No oropharyngeal exudate.  Eyes: Conjunctivae and EOM are normal. Pupils are equal, round, and reactive to light. Right eye exhibits no discharge. Left eye exhibits no discharge. No scleral icterus.  Neck: Normal range of motion. Neck supple. No tracheal deviation present. No thyromegaly present.  Cardiovascular: Normal rate, regular rhythm and normal heart sounds.  Exam reveals no gallop and no friction rub.   No murmur heard. Pulmonary/Chest: Effort  normal and breath sounds normal. No respiratory distress. He has no wheezes. He has no rales. He exhibits no tenderness.  Musculoskeletal: Normal range of motion. He exhibits no edema.  Lymphadenopathy:    He has no cervical adenopathy.  Neurological: He is alert and oriented to person, place, and time. No cranial nerve deficit. Coordination normal.  Skin: Skin is warm and dry. No rash noted. He is not diaphoretic. No erythema. No pallor.  Psychiatric: He has a normal mood and affect. His behavior is normal. Judgment and thought content normal.          Assessment & Plan:

## 2012-07-16 NOTE — Assessment & Plan Note (Signed)
BP Readings from Last 3 Encounters:  07/16/12 128/50  06/08/12 128/60  06/06/12 142/60   BP well controlled on current medications. Renal function stable on recent labs. No changes made.

## 2012-07-16 NOTE — Assessment & Plan Note (Signed)
Recent CBC and ferritin normal. Will plan to recheck with labs in 3 months.

## 2012-07-18 ENCOUNTER — Inpatient Hospital Stay: Payer: Self-pay | Admitting: Internal Medicine

## 2012-07-18 LAB — COMPREHENSIVE METABOLIC PANEL
Albumin: 4.2 g/dL (ref 3.4–5.0)
BUN: 26 mg/dL — ABNORMAL HIGH (ref 7–18)
Co2: 27 mmol/L (ref 21–32)
Creatinine: 1.46 mg/dL — ABNORMAL HIGH (ref 0.60–1.30)
EGFR (African American): 52 — ABNORMAL LOW
EGFR (Non-African Amer.): 44 — ABNORMAL LOW
Glucose: 182 mg/dL — ABNORMAL HIGH (ref 65–99)
Osmolality: 283 (ref 275–301)
SGOT(AST): 694 U/L — ABNORMAL HIGH (ref 15–37)
SGPT (ALT): 385 U/L — ABNORMAL HIGH (ref 12–78)
Total Protein: 7.5 g/dL (ref 6.4–8.2)

## 2012-07-18 LAB — CBC
HCT: 41.4 % (ref 40.0–52.0)
HGB: 13.6 g/dL (ref 13.0–18.0)
MCH: 29 pg (ref 26.0–34.0)
MCHC: 33 g/dL (ref 32.0–36.0)
RDW: 14.7 % — ABNORMAL HIGH (ref 11.5–14.5)

## 2012-07-18 LAB — PROTIME-INR: Prothrombin Time: 14.9 secs — ABNORMAL HIGH (ref 11.5–14.7)

## 2012-07-18 LAB — URINALYSIS, COMPLETE
Bacteria: NONE SEEN
Hyaline Cast: 3
Ketone: NEGATIVE
Ph: 5 (ref 4.5–8.0)
Protein: NEGATIVE
Specific Gravity: 1.015 (ref 1.003–1.030)
Squamous Epithelial: <1

## 2012-07-18 LAB — LIPASE, BLOOD: Lipase: >3000 U/L (ref 73–393)

## 2012-07-18 IMAGING — CR DG CHEST 1V PORT
1 series · 1 of 1 positions shown · non-contrast
Comparison: none

REASON FOR EXAM: Chest Pain
COMMENTS:

PROCEDURE:     DXR - DXR PORTABLE CHEST SINGLE VIEW  - [DATE]  [DATE]
RESULT:     Comparison is made to the study [DATE].
The lungs are mildly hypoinflated. The interstitial markings are diffusely
increased and the pulmonary vascularity is engorged. The cardiac silhouette
is enlarged.

[ap]
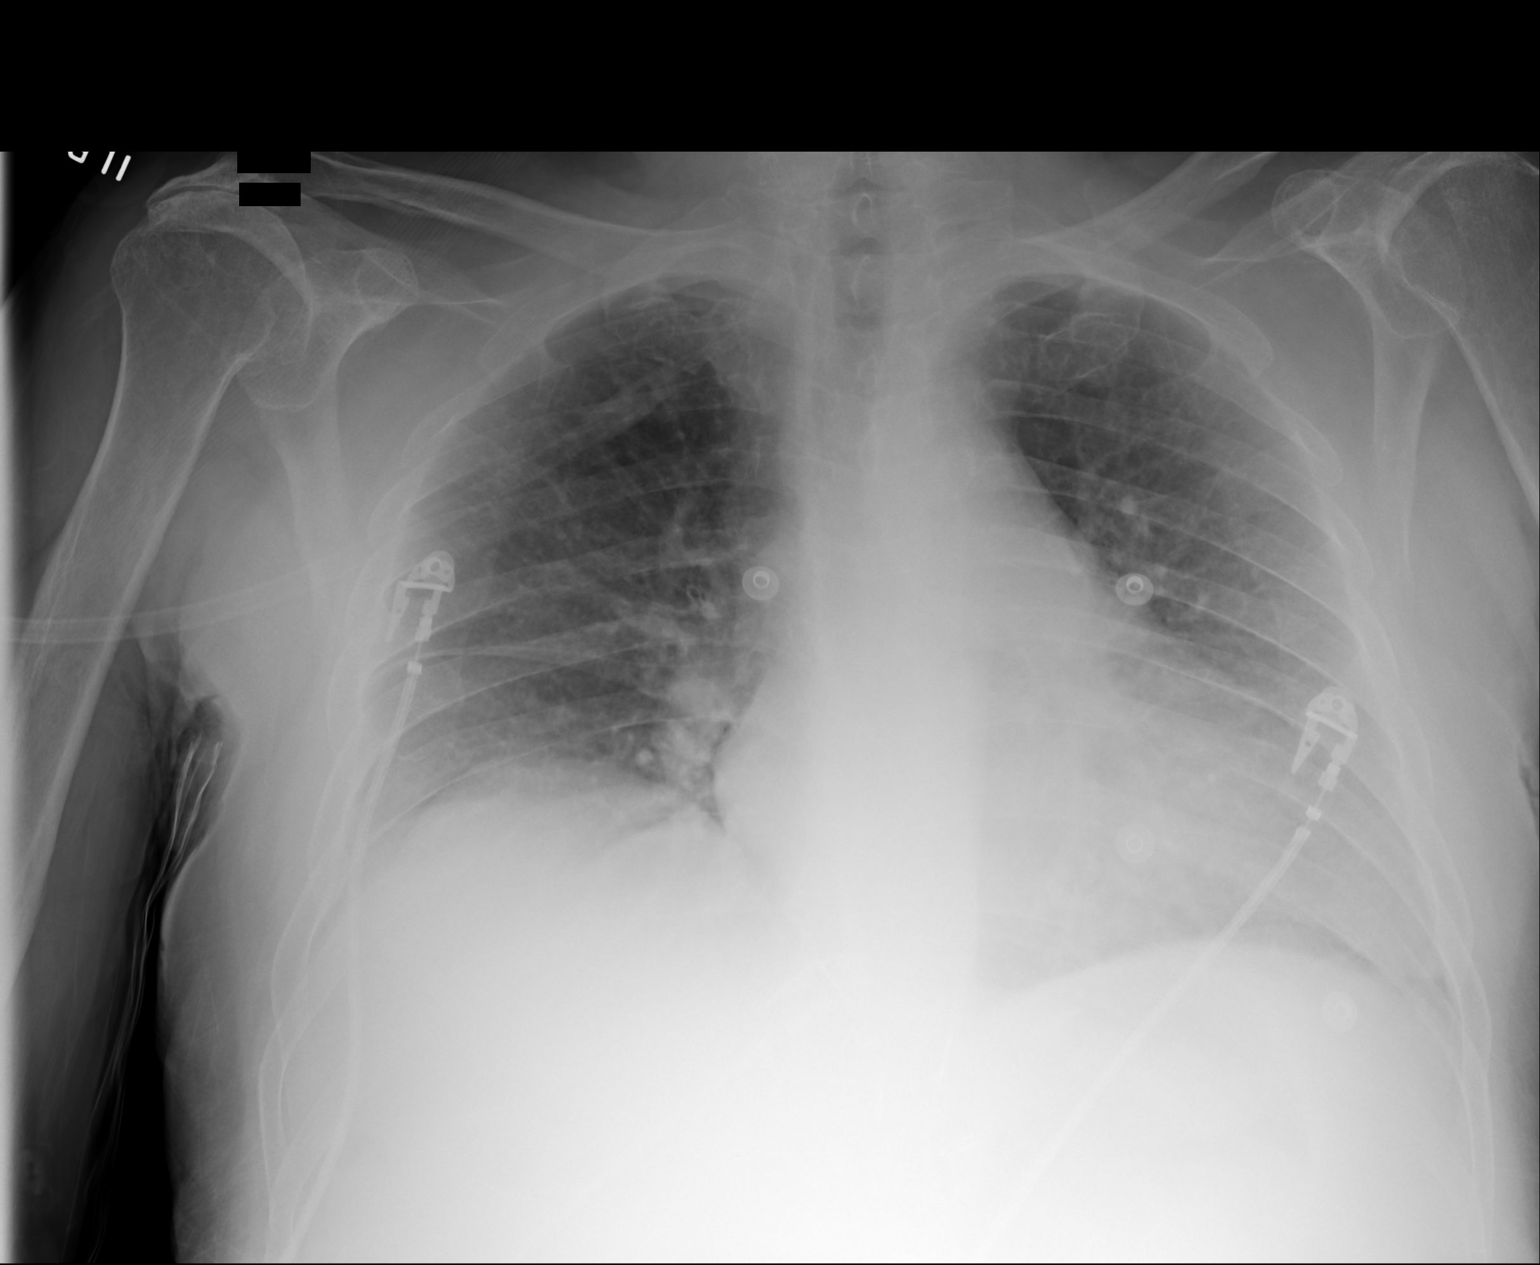

[1 of 1 positions shown; findings below may reference images not displayed]

IMPRESSION: The findings are consistent with CHF with mild pulmonary
interstitial edema.

[REDACTED]

## 2012-07-18 IMAGING — CT CT ABD-PELV W/O CM
1 of 2 series · 15 of 32 positions shown, 19 images · non-contrast
Comparison: None

REASON FOR EXAM: (1) pancreatitis, elevated tramnsminases; (2) same, r/o
gallstone pancreatitis
COMMENTS:

PROCEDURE:     CT  - CT ABDOMEN AND PELVIS W[DATE]  [DATE]
RESULT:     Indication: Pancreatitis
TECHNIQUE: Multiple axial images from the lung bases to the symphysis pubis
were obtained without oral and without intravenous contrast.

[Series 2: soft tissue · axial · 0.76mm/px · z∈[-583,-142]mm · 15 of 161 slices shown, 19 images]
[im 7/161  soft-tissue]
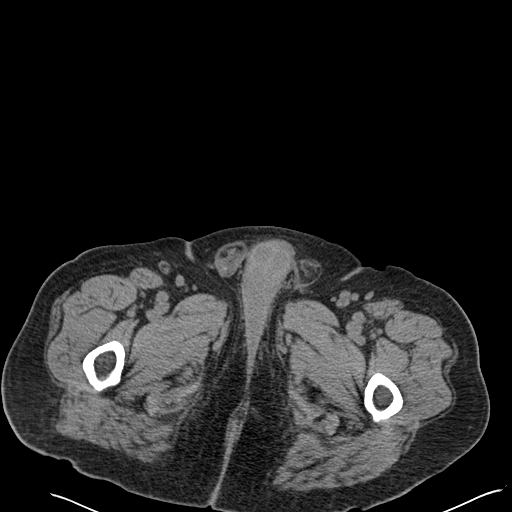
[im 7/161  bone]
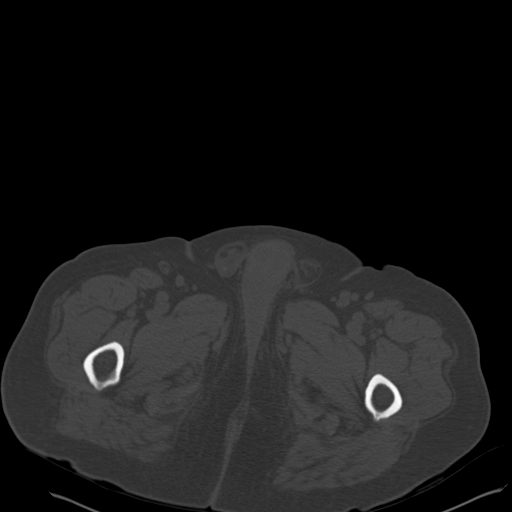
[im 21/161  soft-tissue]
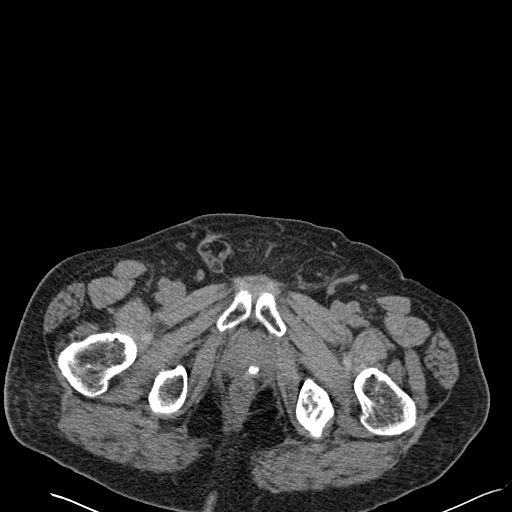
[im 34/161  soft-tissue]
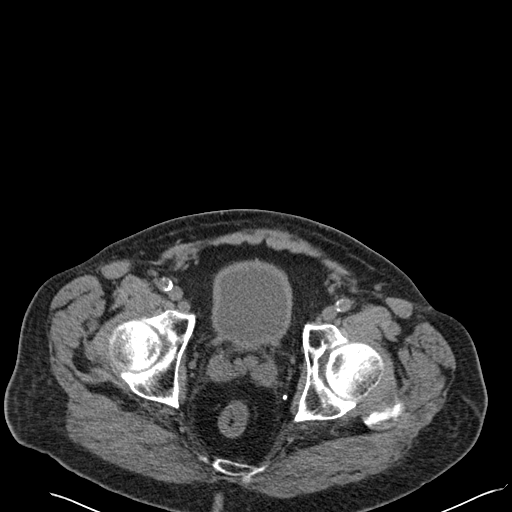
[im 47/161  soft-tissue]
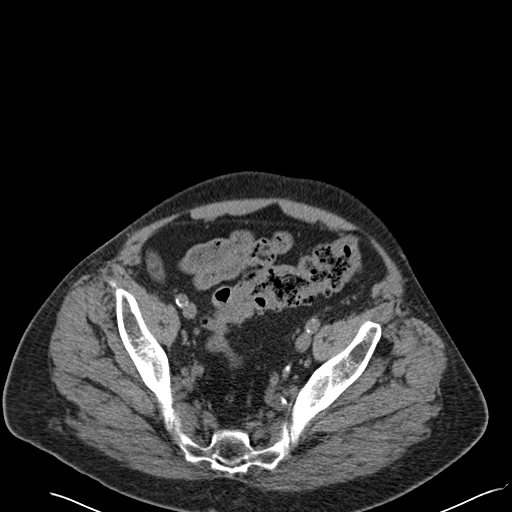
[im 54/161  soft-tissue]
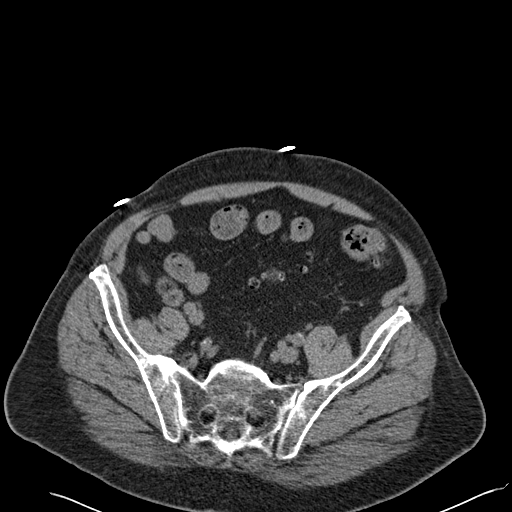
[im 67/161  soft-tissue]
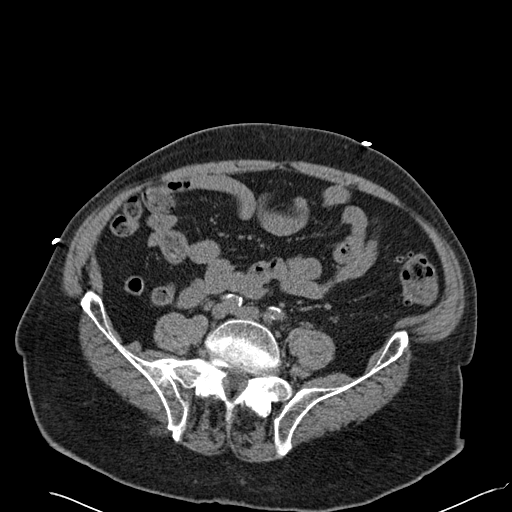
[im 81/161  soft-tissue]
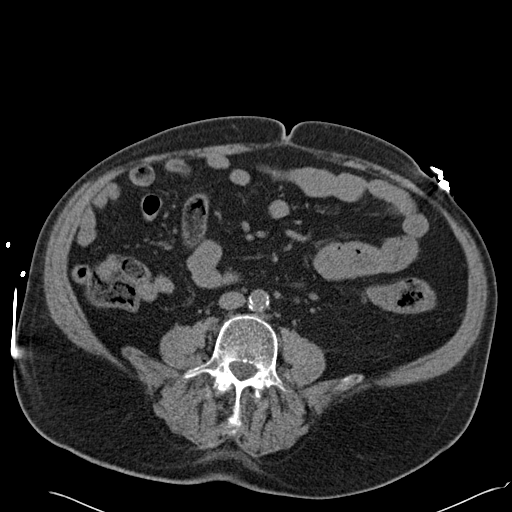
[im 94/161  soft-tissue]
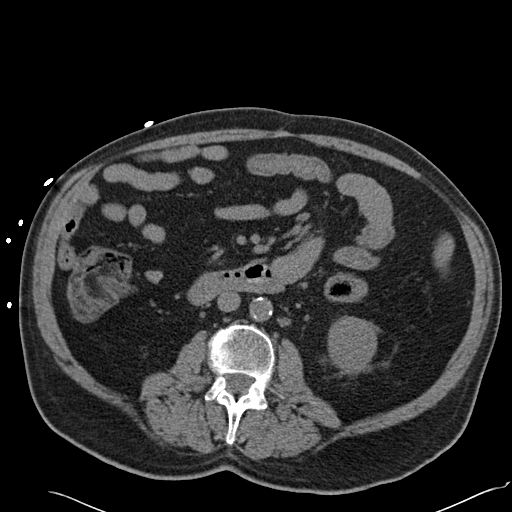
[im 107/161  soft-tissue]
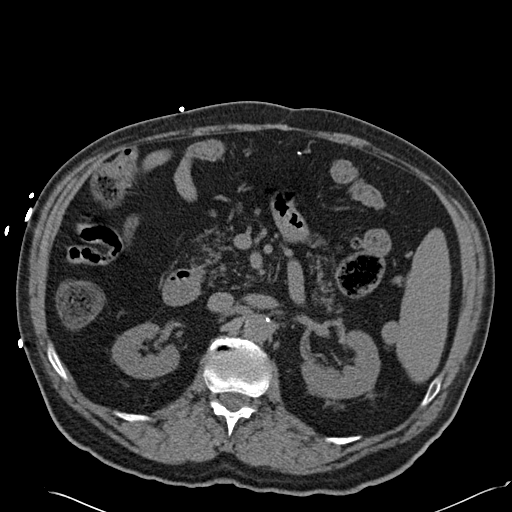
[im 107/161  bone]
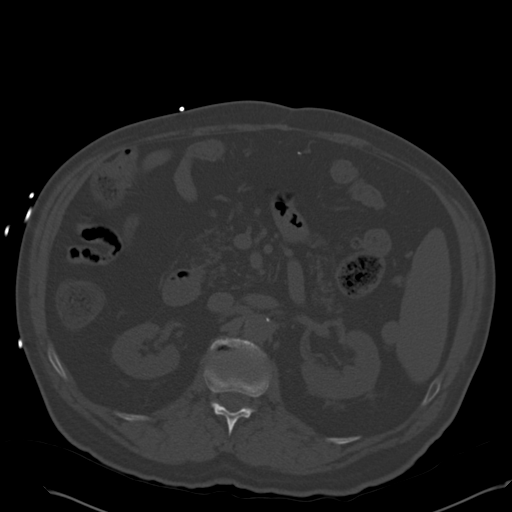
[im 114/161  soft-tissue]
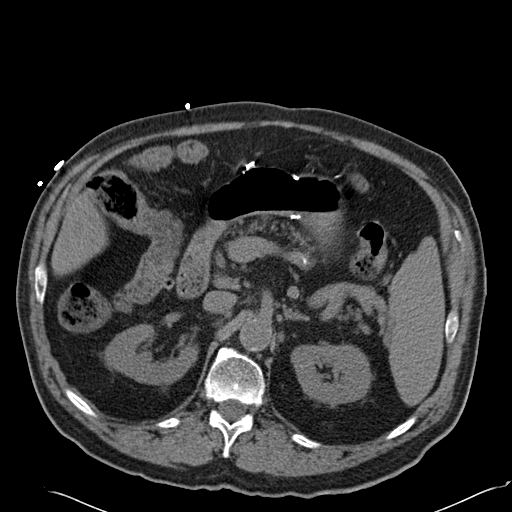
[im 127/161  soft-tissue]
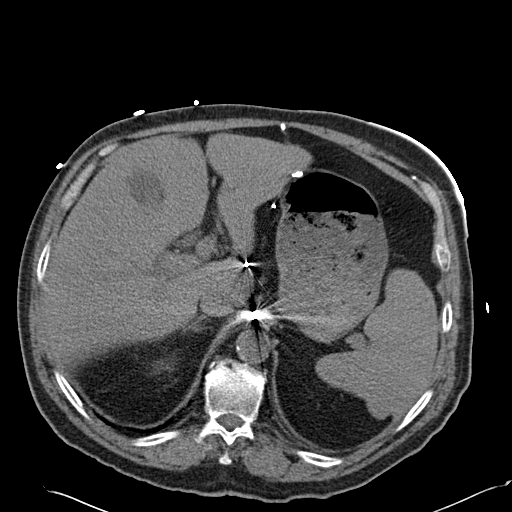
[im 134/161  lung]
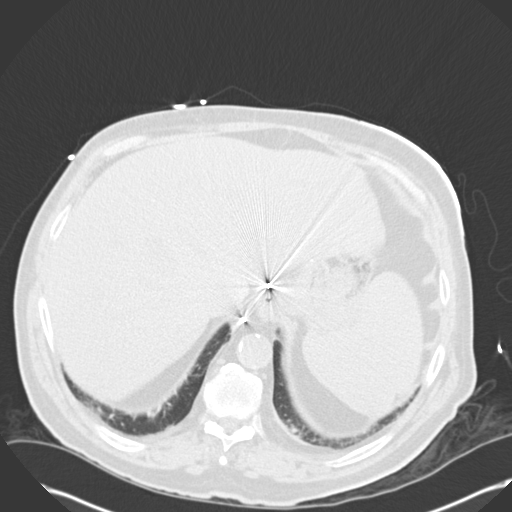
[im 141/161  soft-tissue]
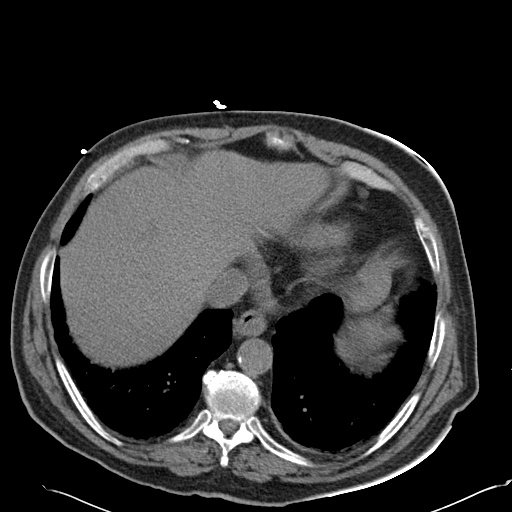
[im 141/161  lung]
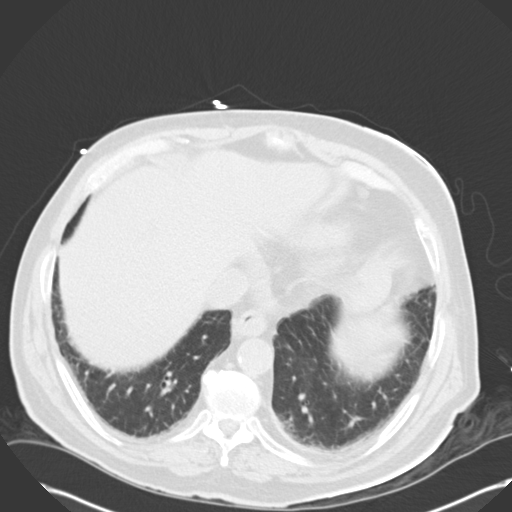
[im 147/161  lung]
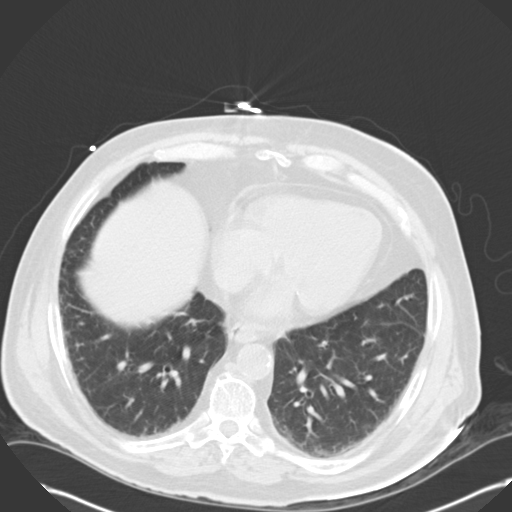
[im 154/161  soft-tissue]
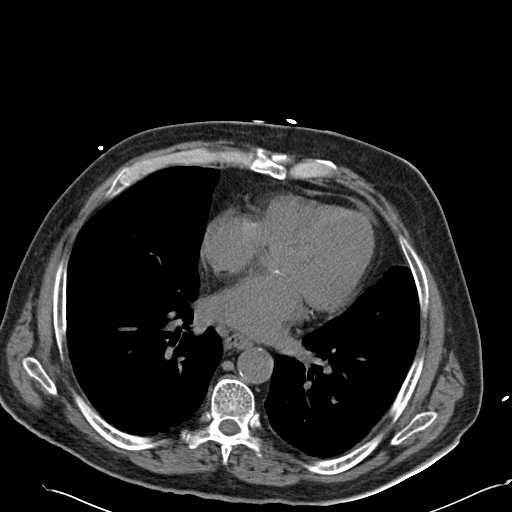
[im 154/161  lung]
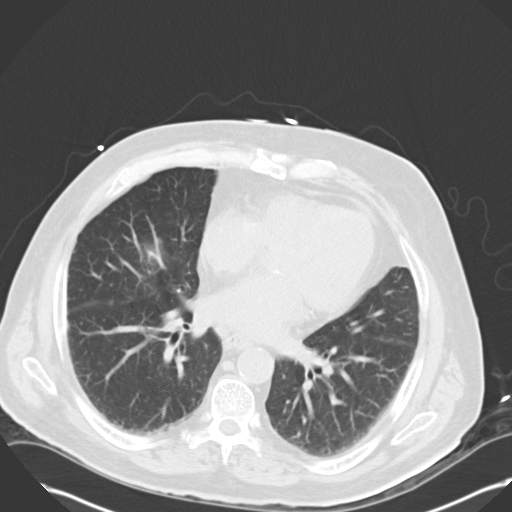

[15 of 32 positions shown; findings below may reference images not displayed]

FINDINGS: The lung bases are clear. There is no pleural or pericardial effusions.

No renal, ureteral, or bladder calculi. No obstructive uropathy. No
perinephric stranding is seen. The kidneys are symmetric in size without
evidence for exophytic mass. There is relative bladder wall thickening which
may be secondary to underdistention versus cystitis.

The liver demonstrates no focal abnormality. There are tiny cholelithiasis..
The spleen demonstrates no focal abnormality. The adrenal glands are normal.
The pancreas is atrophic, but otherwise normal.

There are surgical clips in the epigastric region from prior surgery. The
unopacified stomach, duodenum, small intestine, and large intestine are
unremarkable, but evaluation is limited by lack of oral contrast. There is
diverticulosis without evidence of diverticulitis. There is no
pneumoperitoneum, pneumatosis, or portal venous gas. There is no abdominal
or pelvic free fluid. There is no lymphadenopathy.

The abdominal aorta is normal in caliber with atherosclerosis.

The osseous structures are unremarkable.
IMPRESSION: 1. Cholelithiasis.
2. No evidence of acute pancreatitis.

[REDACTED]

## 2012-07-19 LAB — COMPREHENSIVE METABOLIC PANEL
Albumin: 3.6 g/dL (ref 3.4–5.0)
Alkaline Phosphatase: 183 U/L — ABNORMAL HIGH (ref 50–136)
Bilirubin,Total: 4.2 mg/dL — ABNORMAL HIGH (ref 0.2–1.0)
Chloride: 106 mmol/L (ref 98–107)
Co2: 27 mmol/L (ref 21–32)
EGFR (African American): 46 — ABNORMAL LOW
Osmolality: 293 (ref 275–301)
Potassium: 3.4 mmol/L — ABNORMAL LOW (ref 3.5–5.1)
SGOT(AST): 902 U/L — ABNORMAL HIGH (ref 15–37)
Total Protein: 6.3 g/dL — ABNORMAL LOW (ref 6.4–8.2)

## 2012-07-19 LAB — CBC WITH DIFFERENTIAL/PLATELET
Basophil %: 0.2 %
Eosinophil #: 0 10*3/uL (ref 0.0–0.7)
Eosinophil %: 0 %
HCT: 36.7 % — ABNORMAL LOW (ref 40.0–52.0)
Lymphocyte #: 0.4 10*3/uL — ABNORMAL LOW (ref 1.0–3.6)
Lymphocyte %: 2 %
MCH: 29.4 pg (ref 26.0–34.0)
MCHC: 33.2 g/dL (ref 32.0–36.0)
MCV: 89 fL (ref 80–100)
Monocyte %: 3.5 %
Neutrophil #: 20.7 10*3/uL — ABNORMAL HIGH (ref 1.4–6.5)
Neutrophil %: 94.3 %
RBC: 4.14 10*6/uL — ABNORMAL LOW (ref 4.40–5.90)
RDW: 14.8 % — ABNORMAL HIGH (ref 11.5–14.5)

## 2012-07-19 LAB — CK TOTAL AND CKMB (NOT AT ARMC)
CK, Total: 304 U/L — ABNORMAL HIGH (ref 35–232)
CK-MB: 1.7 ng/mL (ref 0.5–3.6)

## 2012-07-19 LAB — TROPONIN I
Troponin-I: <0.02 ng/mL
Troponin-I: <0.02 ng/mL

## 2012-07-19 LAB — LIPASE, BLOOD: Lipase: 1682 U/L — ABNORMAL HIGH (ref 73–393)

## 2012-07-19 IMAGING — US ABDOMEN ULTRASOUND LIMITED
1 series · 14 of 25 positions shown · non-contrast
Comparison: none

REASON FOR EXAM: elevated transminaseds, bilirubin, pancrteatitis, r/o
CBD dilatation
COMMENTS:   Body Site: GB and Fossa, CBD, Head of Pancreas

PROCEDURE:     US  - US ABDOMEN LIMITED SURVEY  - [DATE] [DATE]
RESULT:     History: Elevated bilirubin. Pancreatitis.
Comparison Study: CT abdomen [DATE].

[Series 1: abdomen ultrasound limited · 0.28mm/px · 14 of 74 slices shown]
[im 1/74]
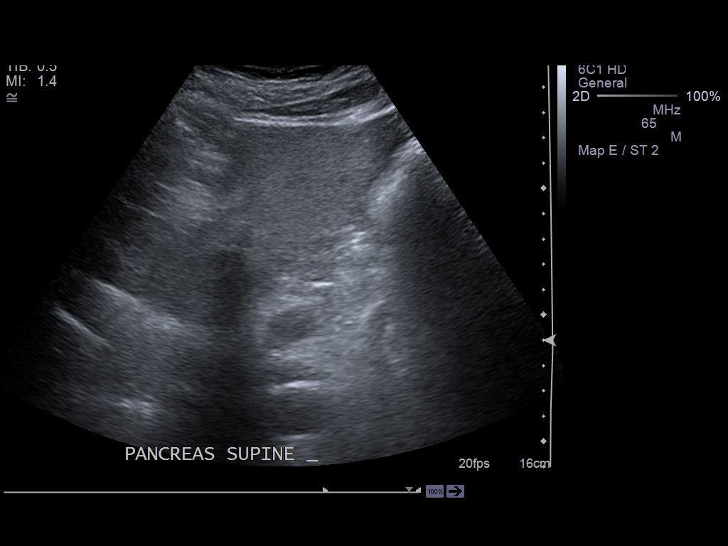
[im 7/74]
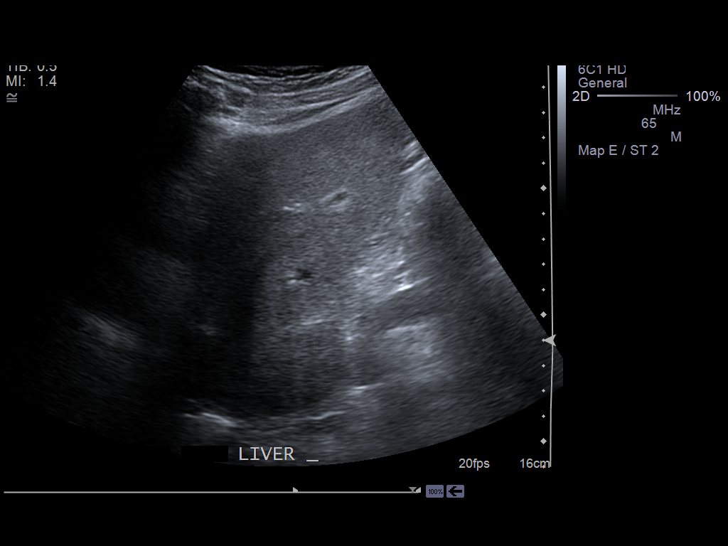
[im 13/74]
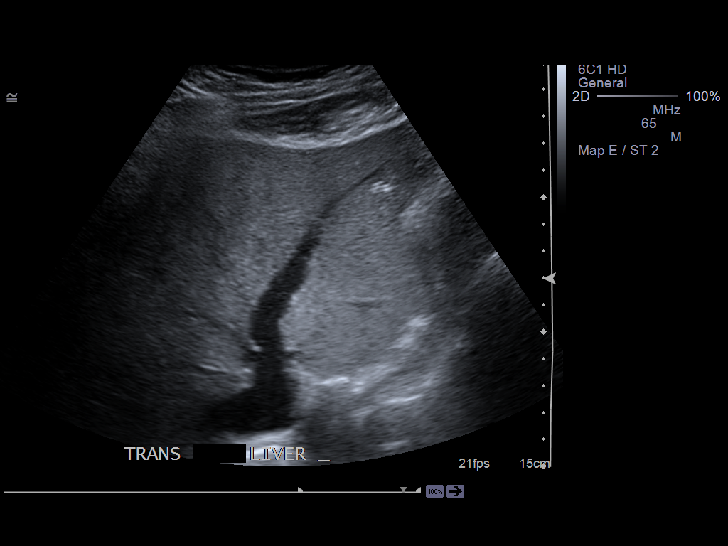
[im 19/74]
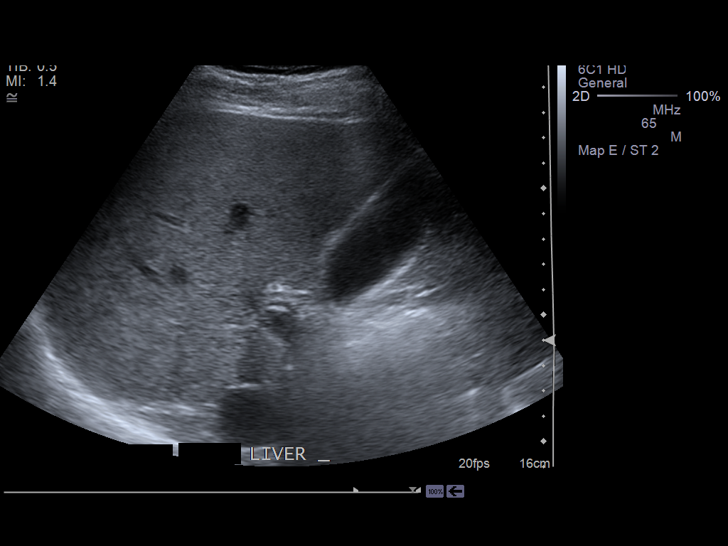
[im 25/74]
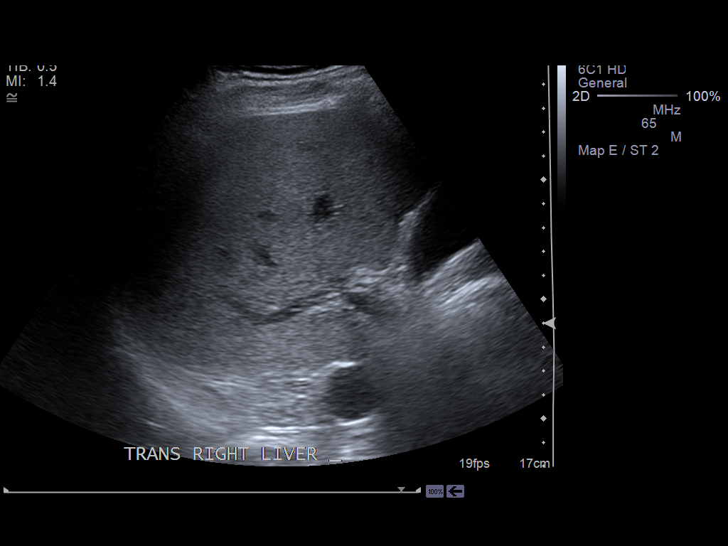
[im 28/74]
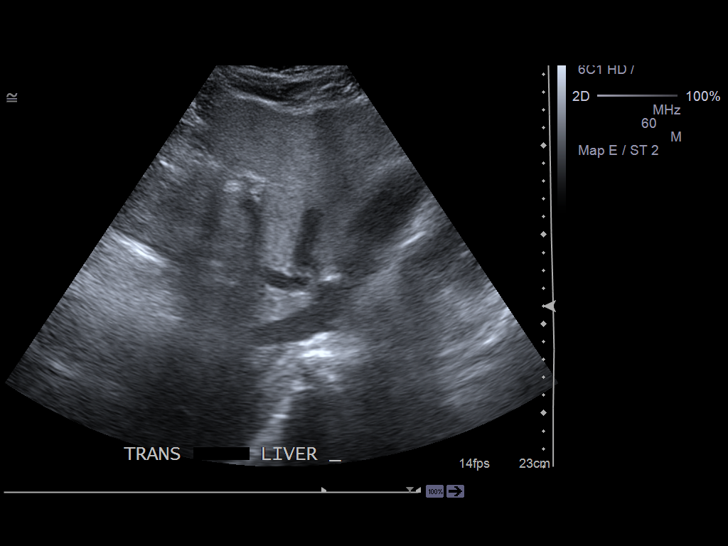
[im 34/74]
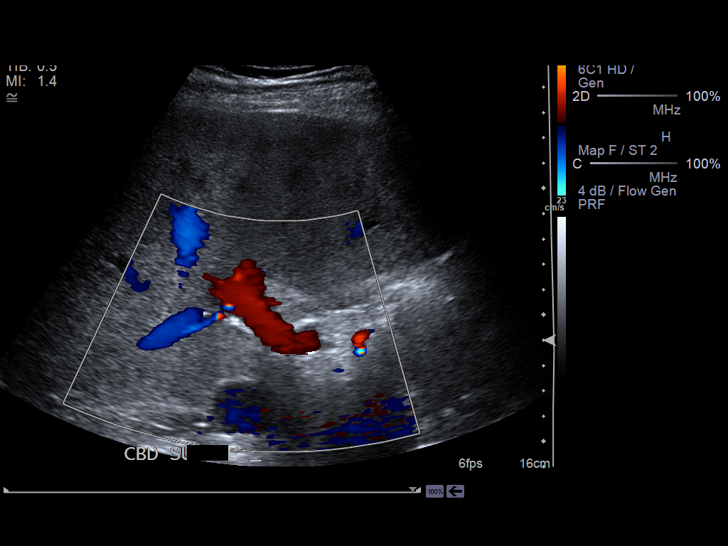
[im 40/74]
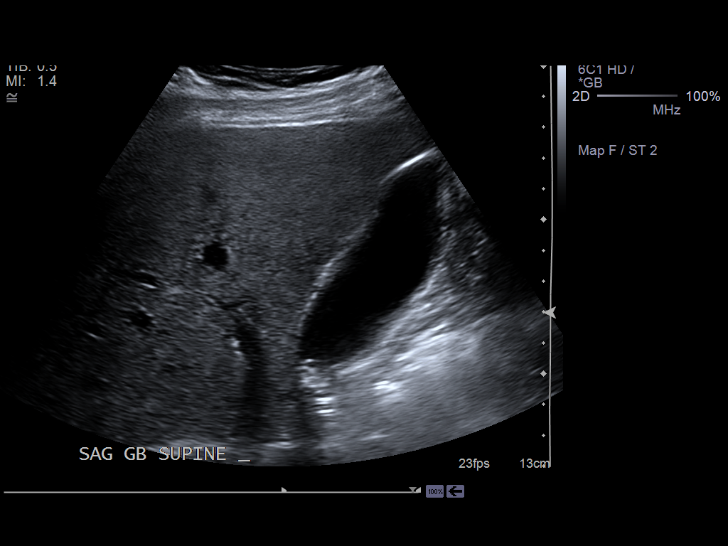
[im 46/74]
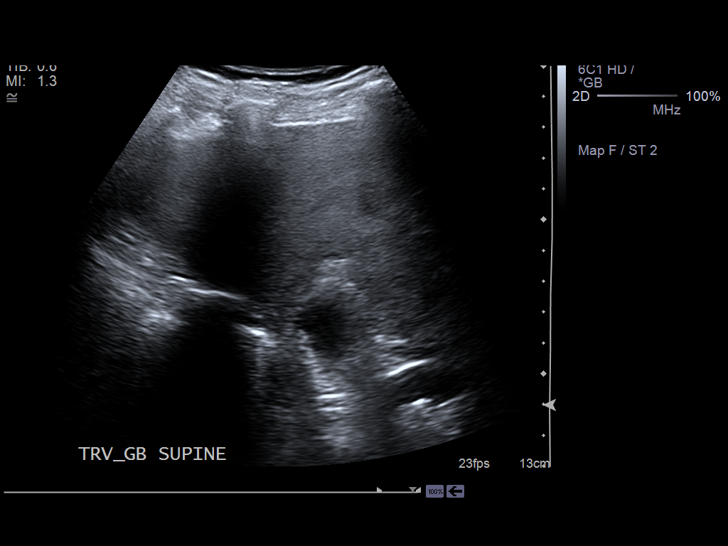
[im 49/74]
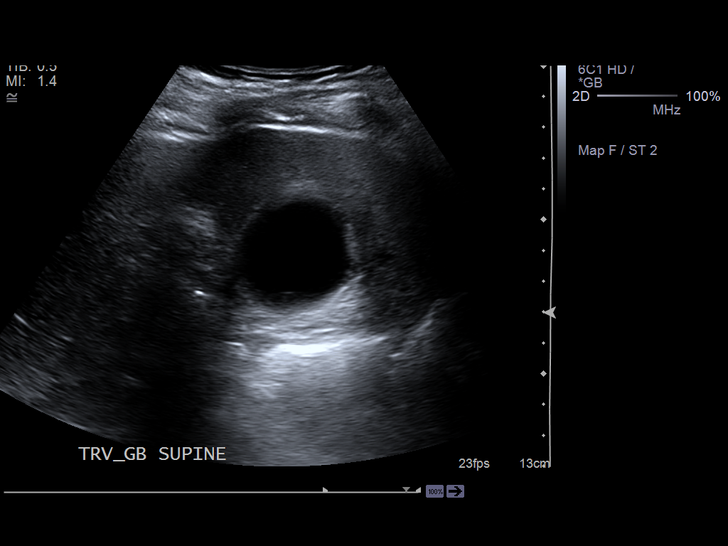
[im 55/74]
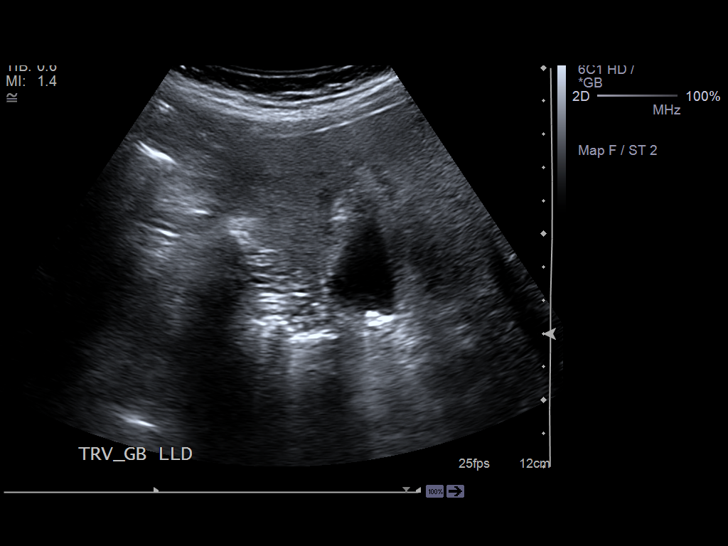
[im 61/74]
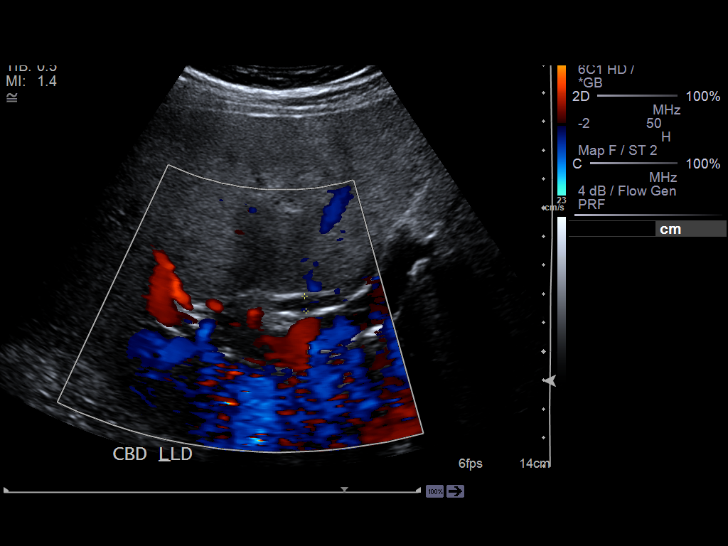
[im 67/74]
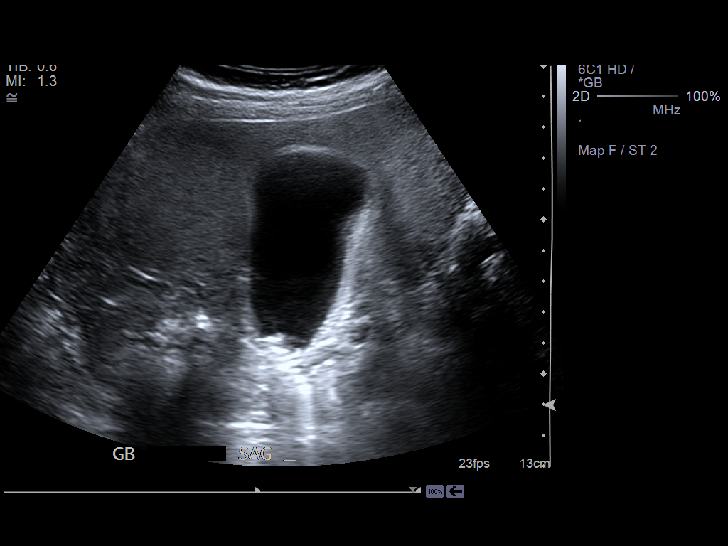
[im 74/74]
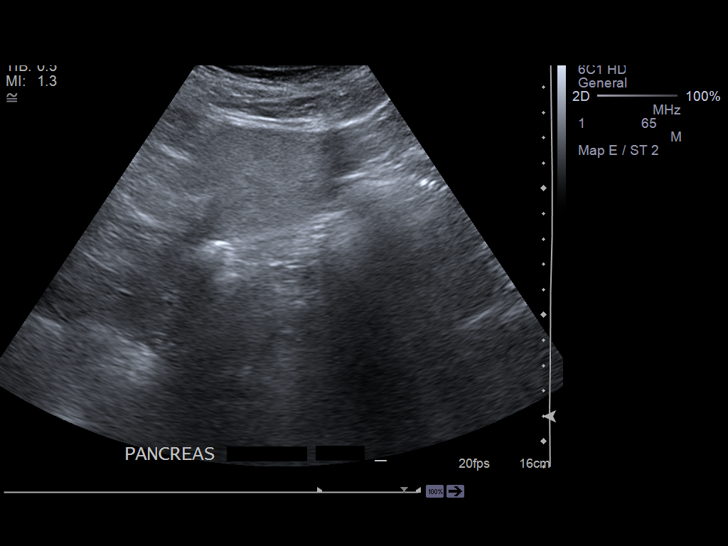

[14 of 25 positions shown; findings below may reference images not displayed]

FINDINGS: No focal hepatic abnormality identified. Pancreas not visualized.
Gallstones are present. Negative Murphy's sign. Gallbladder wall thickness
2.2 mm, the common bile duct caliber 5.2 mm.
IMPRESSION: 1. Gallstones.
2. No biliary distention. Pancreas not visualized due to overlying bowel gas.

## 2012-07-20 DIAGNOSIS — Z01810 Encounter for preprocedural cardiovascular examination: Secondary | ICD-10-CM

## 2012-07-20 DIAGNOSIS — I5033 Acute on chronic diastolic (congestive) heart failure: Secondary | ICD-10-CM

## 2012-07-20 LAB — BASIC METABOLIC PANEL
Anion Gap: 8 (ref 7–16)
BUN: 17 mg/dL (ref 7–18)
EGFR (African American): >60
EGFR (Non-African Amer.): 60 — ABNORMAL LOW
Potassium: 3.6 mmol/L (ref 3.5–5.1)

## 2012-07-20 LAB — COMPREHENSIVE METABOLIC PANEL
Alkaline Phosphatase: 164 U/L — ABNORMAL HIGH (ref 50–136)
Anion Gap: 6 — ABNORMAL LOW (ref 7–16)
Co2: 26 mmol/L (ref 21–32)
EGFR (African American): >60
Potassium: 4.8 mmol/L (ref 3.5–5.1)
SGOT(AST): 247 U/L — ABNORMAL HIGH (ref 15–37)
Sodium: 143 mmol/L (ref 136–145)

## 2012-07-20 LAB — LIPASE, BLOOD: Lipase: 63 U/L — ABNORMAL LOW (ref 73–393)

## 2012-07-21 LAB — HEPATIC FUNCTION PANEL A (ARMC)
Albumin: 2.8 g/dL — ABNORMAL LOW (ref 3.4–5.0)
Alkaline Phosphatase: 167 U/L — ABNORMAL HIGH (ref 50–136)
Bilirubin, Direct: 2.1 mg/dL — ABNORMAL HIGH (ref 0.00–0.20)
Bilirubin,Total: 2.9 mg/dL — ABNORMAL HIGH (ref 0.2–1.0)
SGOT(AST): 80 U/L — ABNORMAL HIGH (ref 15–37)
SGPT (ALT): 290 U/L — ABNORMAL HIGH (ref 12–78)
Total Protein: 5.6 g/dL — ABNORMAL LOW (ref 6.4–8.2)

## 2012-07-22 LAB — COMPREHENSIVE METABOLIC PANEL
Chloride: 106 mmol/L (ref 98–107)
Co2: 23 mmol/L (ref 21–32)
EGFR (African American): >60
EGFR (Non-African Amer.): >60
Osmolality: 279 (ref 275–301)
Potassium: 3.4 mmol/L — ABNORMAL LOW (ref 3.5–5.1)
SGOT(AST): 31 U/L (ref 15–37)
Sodium: 138 mmol/L (ref 136–145)

## 2012-07-22 LAB — CBC WITH DIFFERENTIAL/PLATELET
Basophil %: 0.1 %
Eosinophil #: 0 10*3/uL (ref 0.0–0.7)
Eosinophil %: 0.6 %
HGB: 11.2 g/dL — ABNORMAL LOW (ref 13.0–18.0)
Lymphocyte %: 11 %
MCHC: 32.5 g/dL (ref 32.0–36.0)
MCV: 88 fL (ref 80–100)
Neutrophil #: 4.2 10*3/uL (ref 1.4–6.5)
Platelet: 91 10*3/uL — ABNORMAL LOW (ref 150–440)

## 2012-07-22 IMAGING — CR DG CHEST 2V
1 series · 2 of 2 positions shown · non-contrast
Comparison: none

REASON FOR EXAM: COPD
COMMENTS:

PROCEDURE:     DXR - DXR CHEST PA (OR AP) AND LATERAL  - [DATE]  [DATE]
RESULT:     Comparison: [DATE]

[Series 1: w chest pa · 0.14mm/px · 2 of 2 slices shown]
[im 1/2]
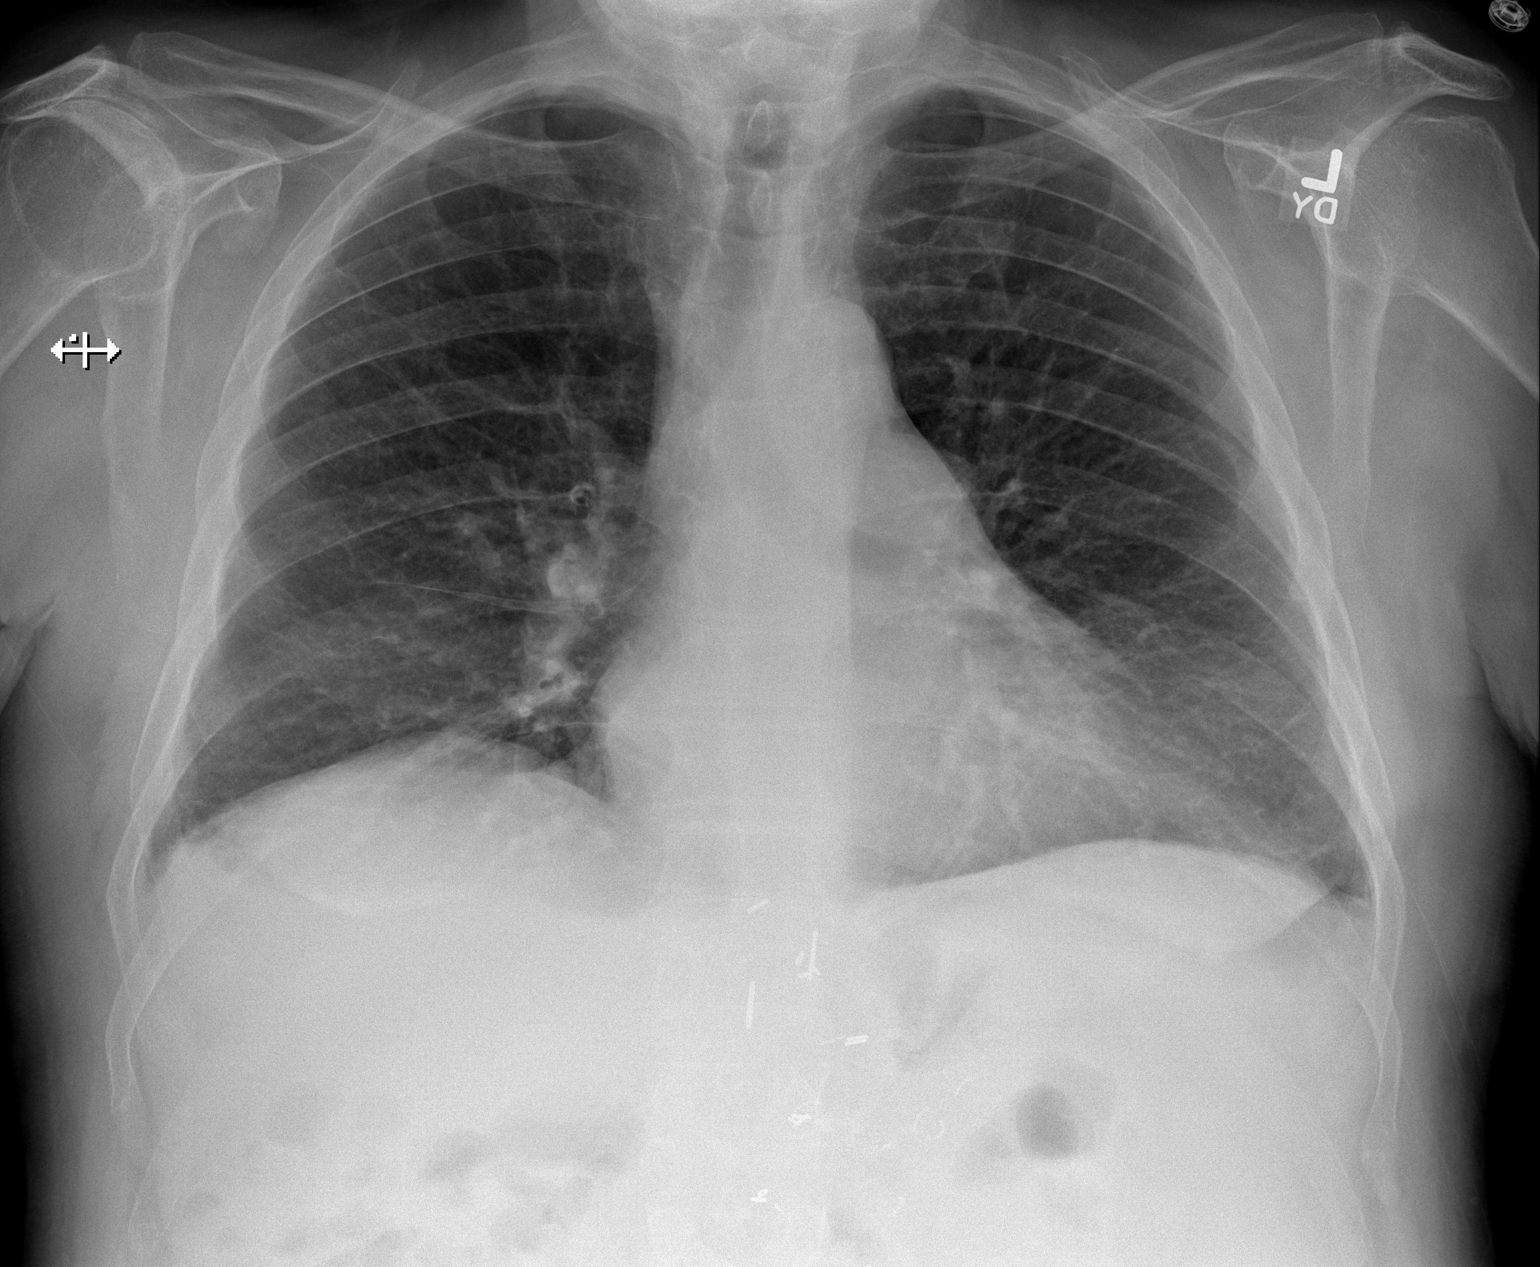
[im 2/2]
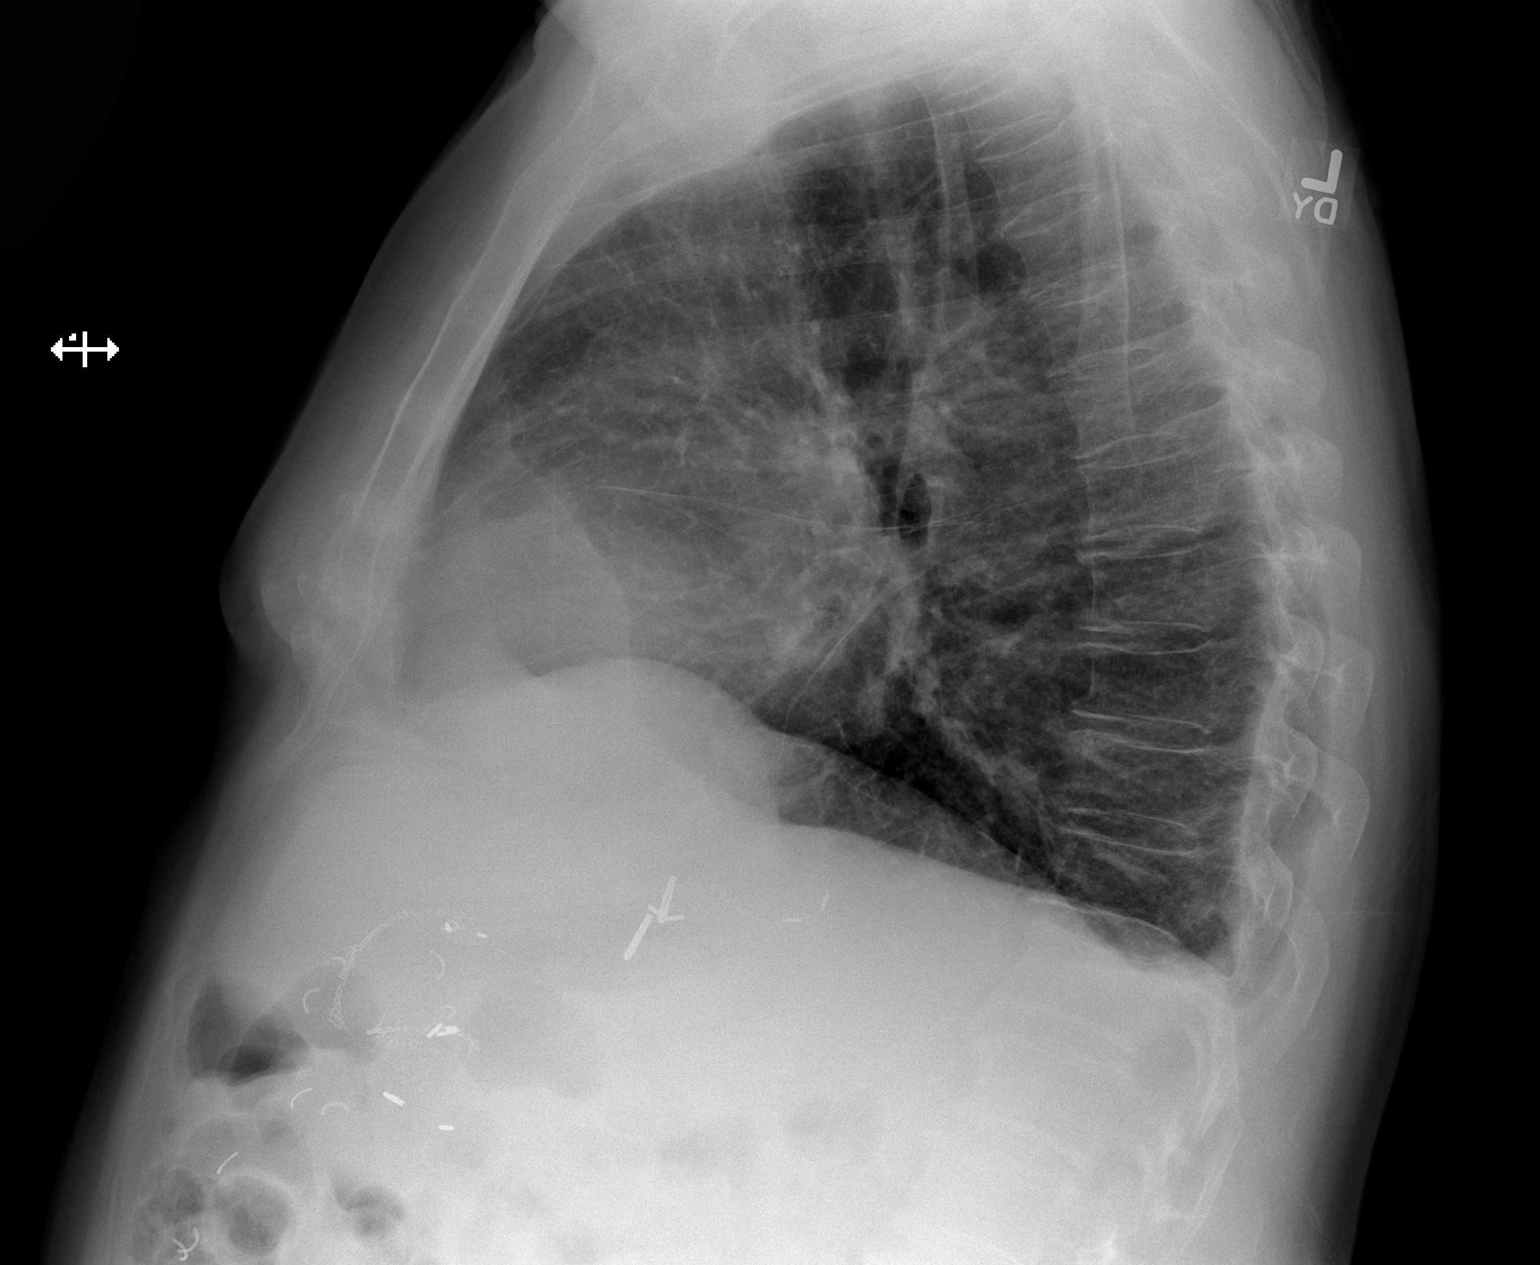

[2 of 2 positions shown; findings below may reference images not displayed]

FINDINGS: The heart and mediastinum are stable. The lung volumes are slightly
diminished. There are small bilateral pleural effusions. Mild basilar
reticular opacities are likely secondary to subsegmental atelectasis.
Multiple surgical clips overlie the upper abdomen.
IMPRESSION: Small bilateral pleural effusions with mild basilar opacities likely
secondary to atelectasis.

[REDACTED]

## 2012-07-22 IMAGING — XA DG CHOLANGIOGRAM OPERATIVE
1 series · 2 of 2 positions shown · non-contrast
Comparison: none

REASON FOR EXAM: Cholelithiasis/lap chole with grams
COMMENTS:

PROCEDURE:     DXR - DXR CHOLANGIOGRAM OP (INITIAL)  - [DATE]  [DATE]
RESULT:     Comparison: CT of the abdomen and pelvis [DATE]

[Series 6001: (id) new (id) · 2 of 2 slices shown]
[im 1/2]
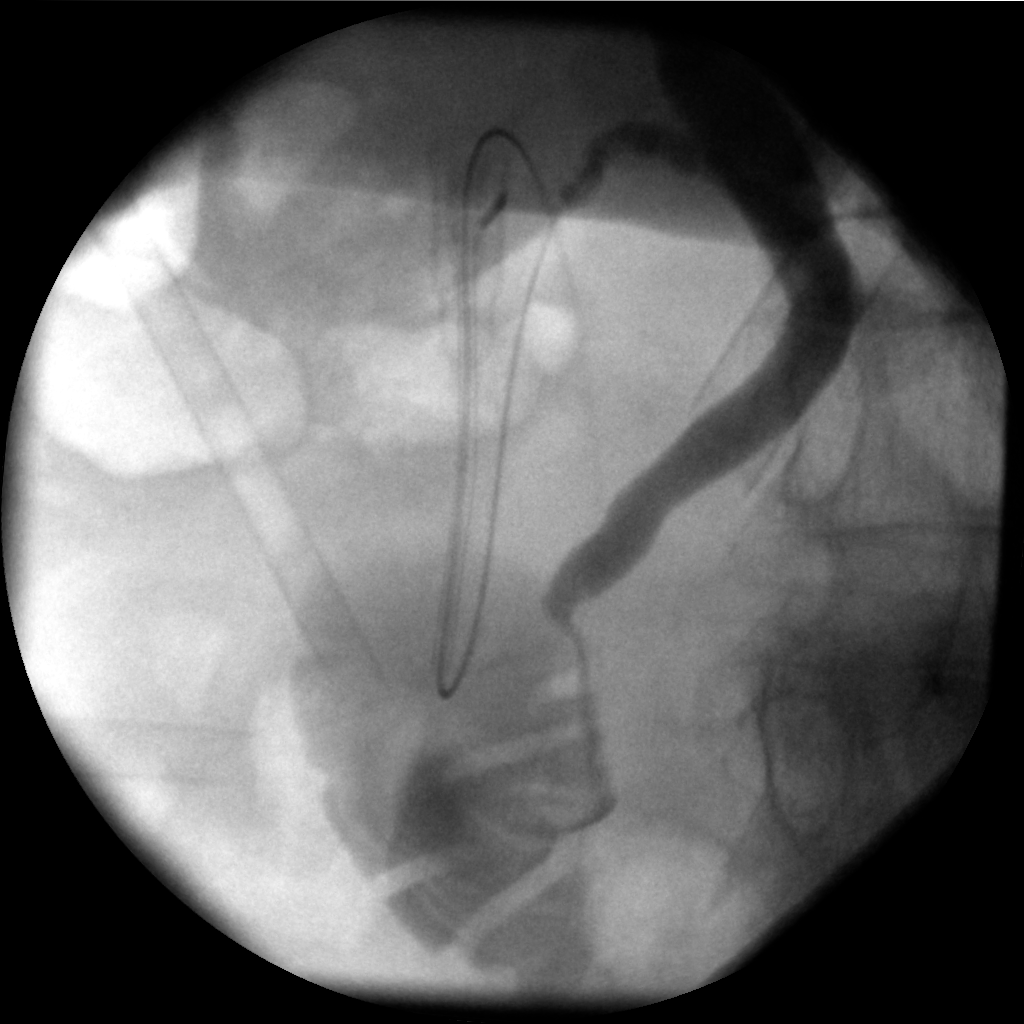
[im 2/2]
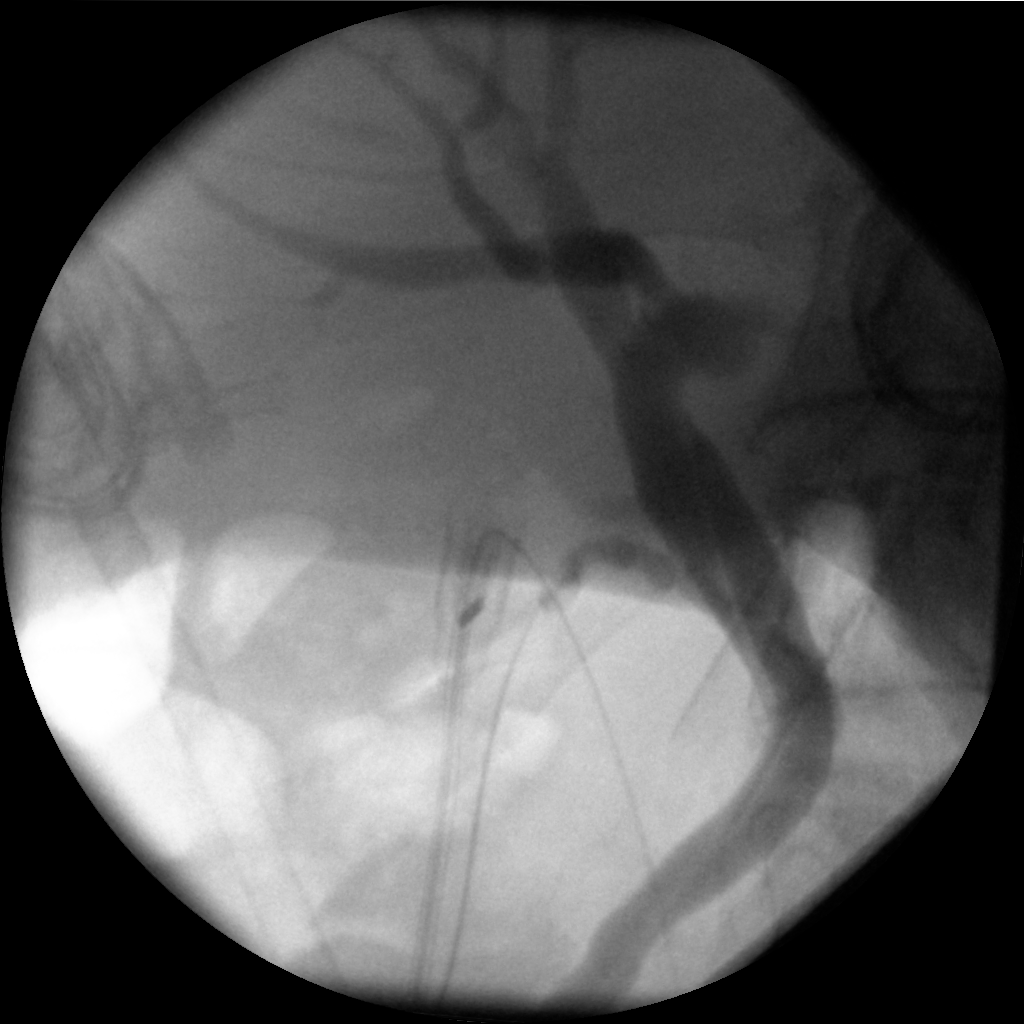

[2 of 2 positions shown; findings below may reference images not displayed]

FINDINGS: There is opacification of the intrahepatic biliary ducts, extra hepatic bile
duct, cystic duct stump, and duodenum. There is focal narrowing of the
inferior common bile duct over a moderate-sized length. No intraluminal
filling defect identified. The remainder of the interpretation is deferred
to the performing clinician.
IMPRESSION: There is a moderate length segment of narrowing of the inferior common bile
duct. This could represent a benign or malignant stricture. ERCP with
consideration for brushings is suggested.

This was called to Dr. BENACEUR at [Q3] hours [DATE].

[REDACTED]

## 2012-07-23 LAB — CBC WITH DIFFERENTIAL/PLATELET
Eosinophil #: 0 10*3/uL (ref 0.0–0.7)
HCT: 35.8 % — ABNORMAL LOW (ref 40.0–52.0)
HGB: 11.6 g/dL — ABNORMAL LOW (ref 13.0–18.0)
Lymphocyte #: 0.6 10*3/uL — ABNORMAL LOW (ref 1.0–3.6)
Lymphocyte %: 4.9 %
MCH: 28.7 pg (ref 26.0–34.0)
MCHC: 32.5 g/dL (ref 32.0–36.0)
Monocyte #: 0.7 x10 3/mm (ref 0.2–1.0)
Monocyte %: 5.9 %
Neutrophil %: 88.7 %
Platelet: 122 10*3/uL — ABNORMAL LOW (ref 150–440)
RBC: 4.06 10*6/uL — ABNORMAL LOW (ref 4.40–5.90)
RDW: 14.4 % (ref 11.5–14.5)
WBC: 11.7 10*3/uL — ABNORMAL HIGH (ref 3.8–10.6)

## 2012-07-23 LAB — PROTIME-INR
INR: 1.1
Prothrombin Time: 14.1 secs (ref 11.5–14.7)

## 2012-07-24 LAB — PATHOLOGY REPORT

## 2012-07-24 LAB — HEPATIC FUNCTION PANEL A (ARMC)
Alkaline Phosphatase: 128 U/L (ref 50–136)
Bilirubin, Direct: 0.5 mg/dL — ABNORMAL HIGH (ref 0.00–0.20)
Bilirubin,Total: 0.9 mg/dL (ref 0.2–1.0)
SGOT(AST): 47 U/L — ABNORMAL HIGH (ref 15–37)
SGPT (ALT): 116 U/L — ABNORMAL HIGH (ref 12–78)

## 2012-07-25 LAB — LIPASE, BLOOD: Lipase: 16 U/L — ABNORMAL LOW (ref 73–393)

## 2012-07-25 IMAGING — CR DG CHEST 2V
1 series · 2 of 2 positions shown · non-contrast
Comparison: none

REASON FOR EXAM: SOB
COMMENTS:

[Series 1: w chest pa · 0.14mm/px · 2 of 2 slices shown]
[im 1/2]
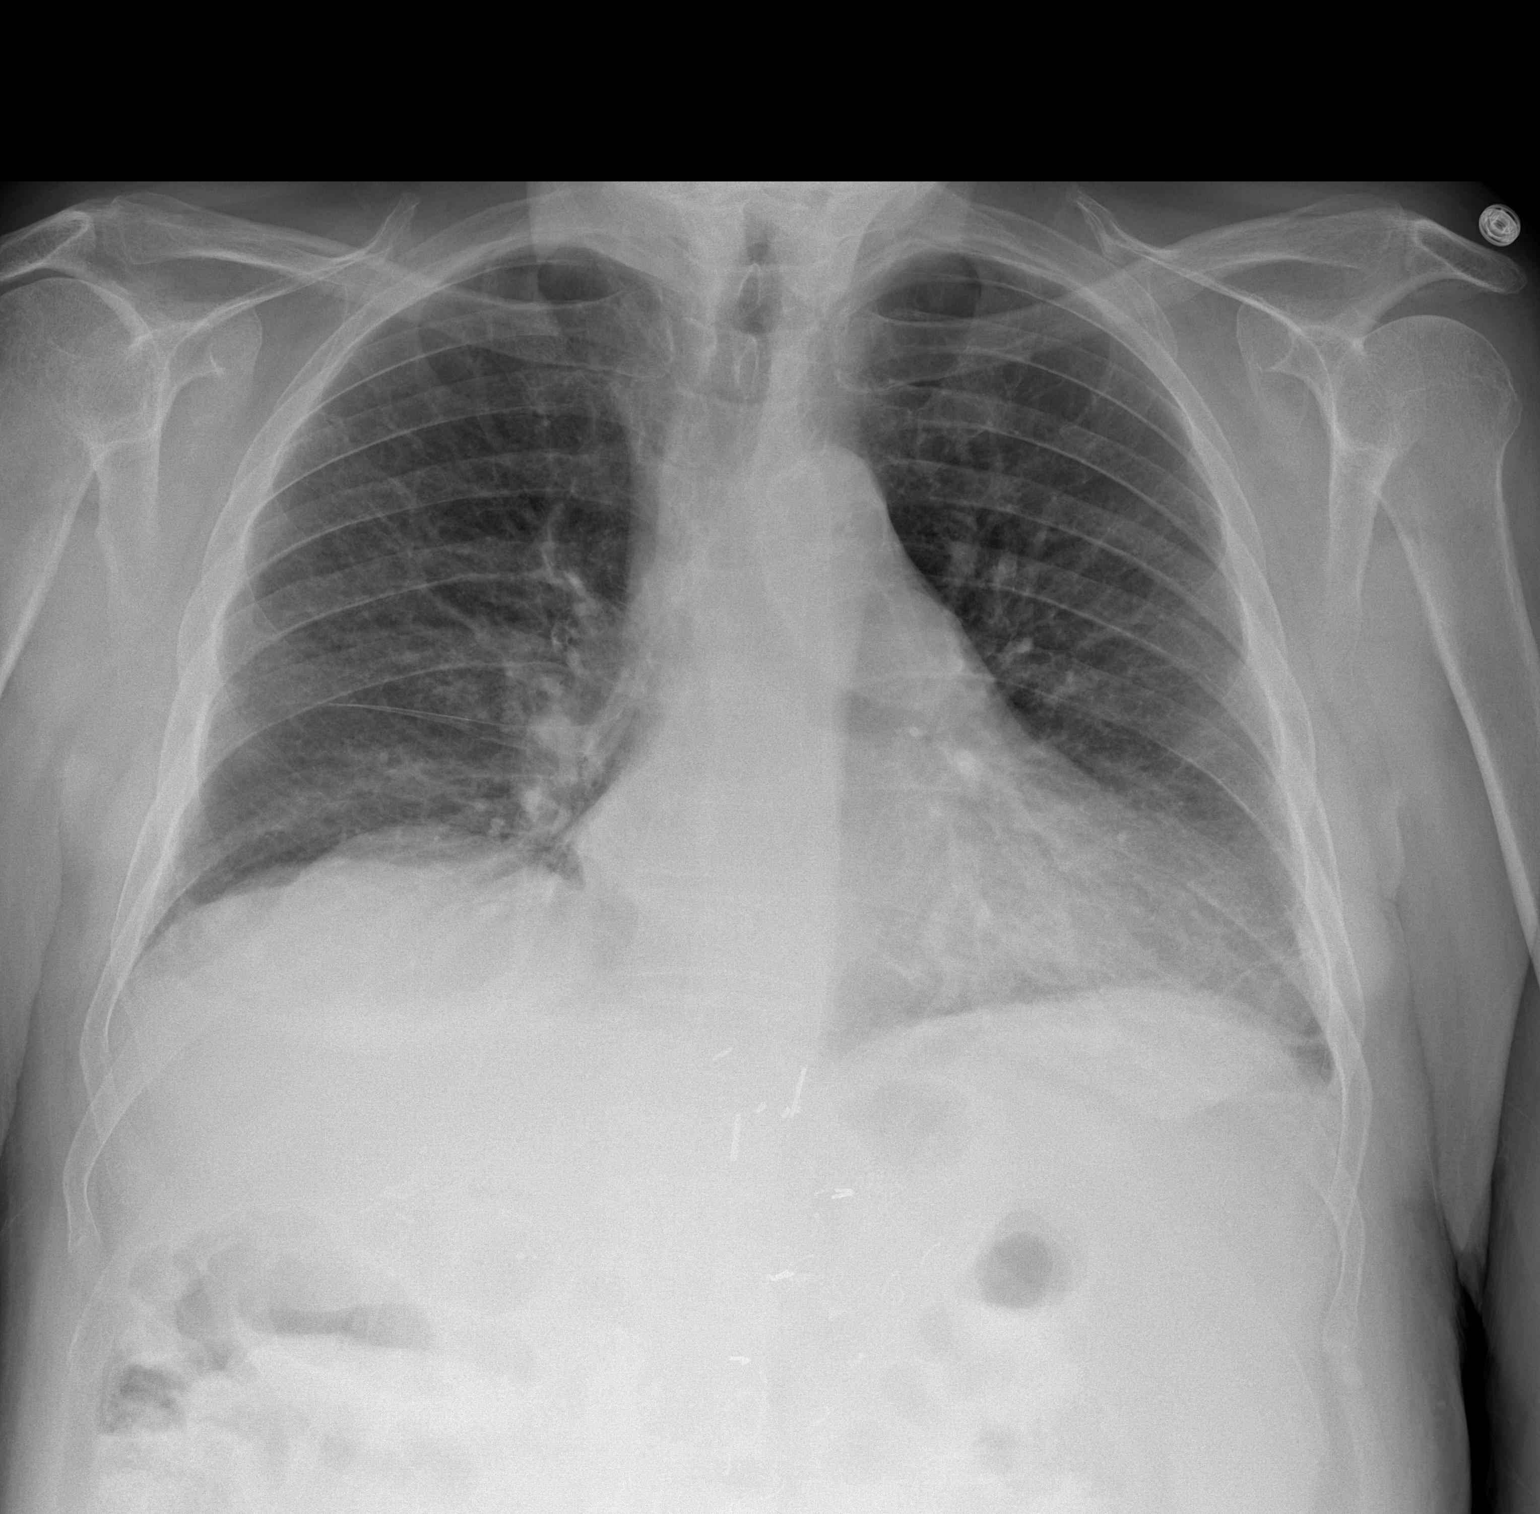
[im 2/2]
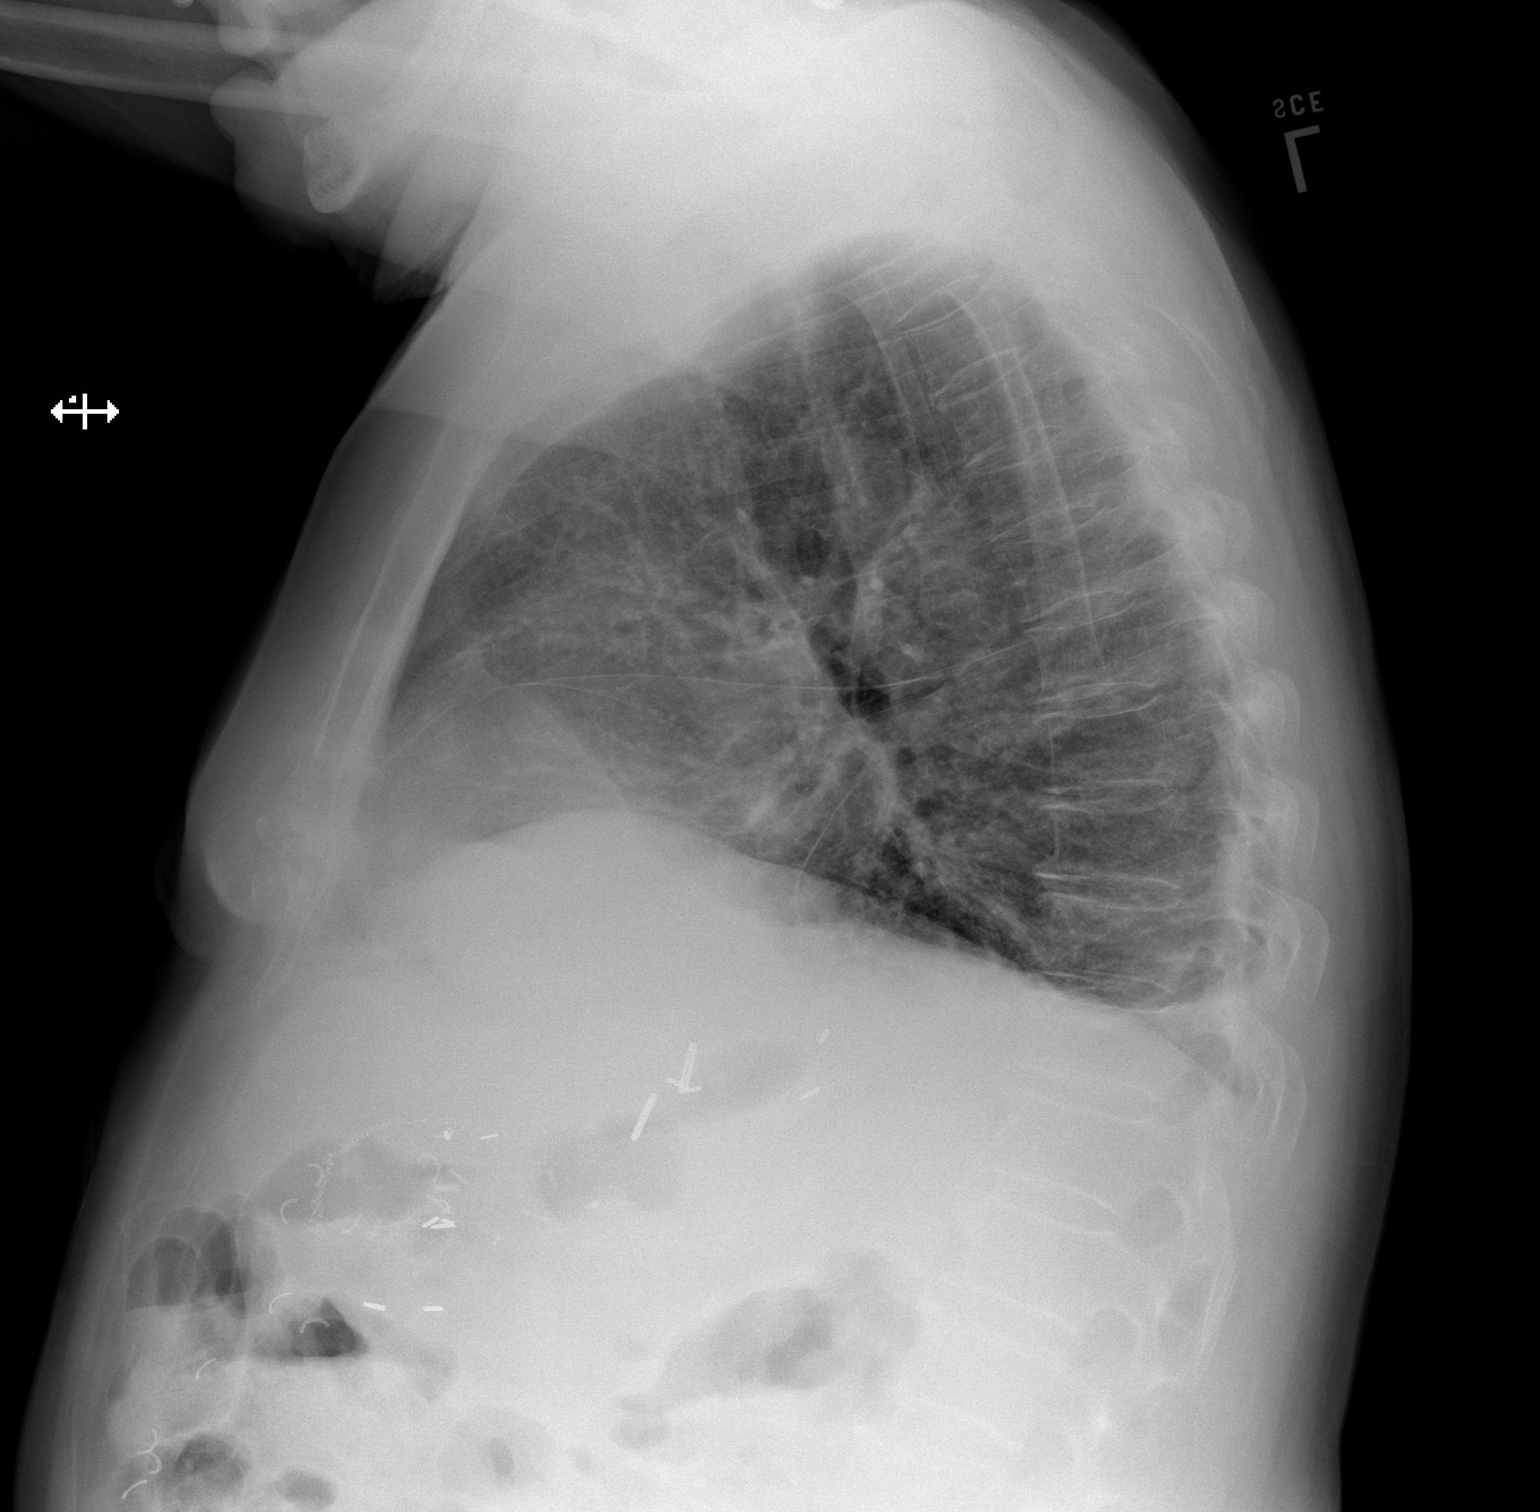

[2 of 2 positions shown; findings below may reference images not displayed]

PROCEDURE:     DXR - DXR CHEST PA (OR AP) AND LATERAL  - [DATE]  [DATE]

RESULT:

Comparison is made to a prior study dated [DATE].

The patient has taken a shallow inspiration. There is thickening of the
interstitial markings and thickening versus fluid along the minor fissure.
No focal regions of consolidation identified. The cardiac silhouette is
moderately enlarged. The visualized bony skeleton is unremarkable.
IMPRESSION: 1.  Shallow inspiration which accentuates the lung findings.
2.  A component of pulmonary edema versus nonedematous interstitial
infiltrate is of diagnostic consideration. Continued surveillance evaluation
is recommended.

## 2012-07-26 LAB — CBC WITH DIFFERENTIAL/PLATELET
Basophil %: 0.1 %
Eosinophil #: 0 10*3/uL (ref 0.0–0.7)
HCT: 32.1 % — ABNORMAL LOW (ref 40.0–52.0)
HGB: 10.5 g/dL — ABNORMAL LOW (ref 13.0–18.0)
MCH: 28.9 pg (ref 26.0–34.0)
Neutrophil #: 14.4 10*3/uL — ABNORMAL HIGH (ref 1.4–6.5)
Platelet: 129 10*3/uL — ABNORMAL LOW (ref 150–440)
RBC: 3.65 10*6/uL — ABNORMAL LOW (ref 4.40–5.90)
RDW: 14.5 % (ref 11.5–14.5)

## 2012-07-26 LAB — LIPASE, BLOOD: Lipase: 39 U/L — ABNORMAL LOW (ref 73–393)

## 2012-07-26 IMAGING — CT CT ABD-PELV W/ CM
1 of 3 series · 13 of 32 positions shown, 18 images · IV contrast (isovue)
Comparison: none

REASON FOR EXAM: (1) post cholecystectomy, belching and pain; (2) post
cholecystectomy DANEL
COMMENTS:

PROCEDURE:     CT  - CT ABDOMEN / PELVIS  W  - [DATE]  [DATE]
RESULT:     Comparison:  [DATE]
TECHNIQUE: Multiple axial images of the abdomen and pelvis were performed
from the lung bases to the pubic symphysis, with p.o. contrast and with 100
mL of Isovue 300 intravenous contrast.

[Series 2: 3mm soft tissue · axial · 0.78mm/px · z∈[-848,-440]mm · 13 of 153 slices shown, 18 images]
[im 9/153  soft-tissue]
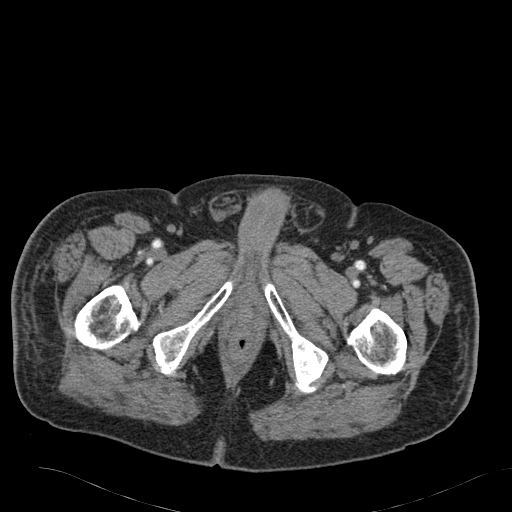
[im 9/153  bone]
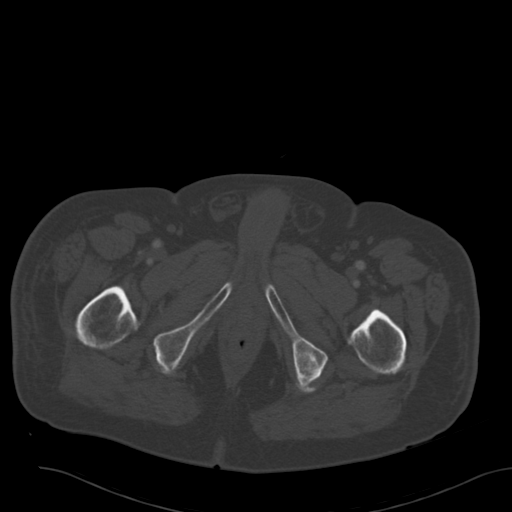
[im 25/153  soft-tissue]
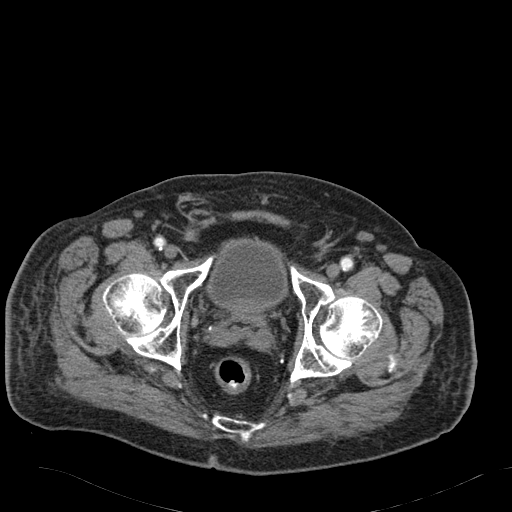
[im 33/153  soft-tissue]
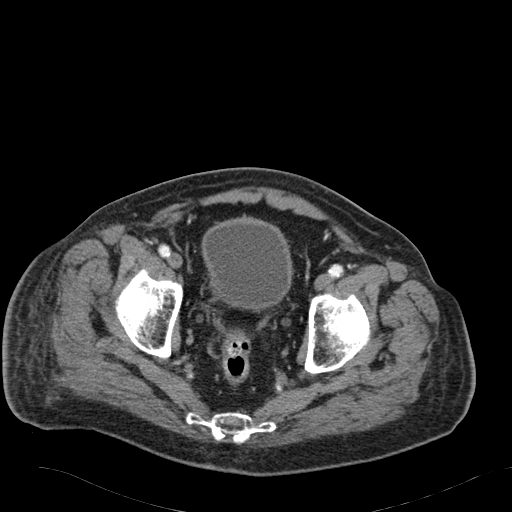
[im 49/153  soft-tissue]
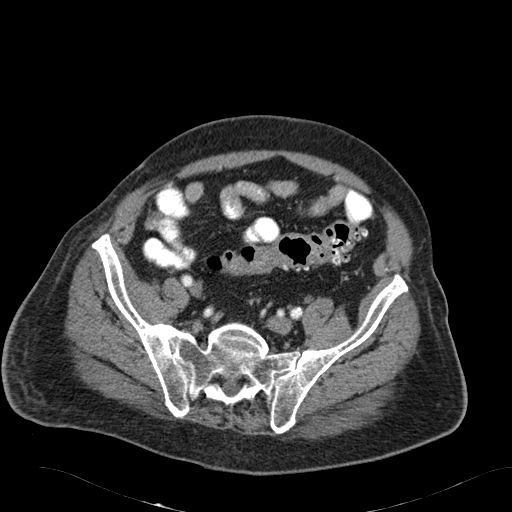
[im 57/153  soft-tissue]
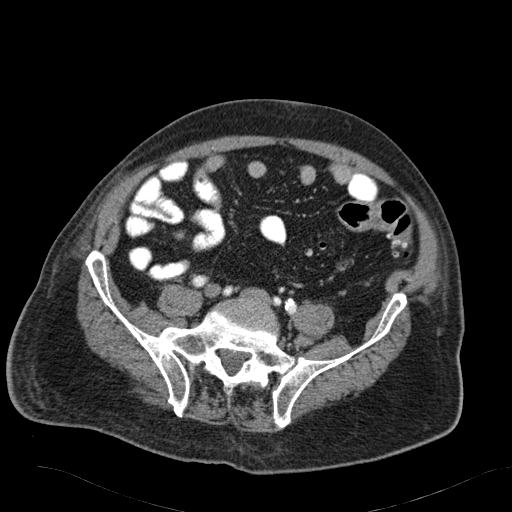
[im 73/153  soft-tissue]
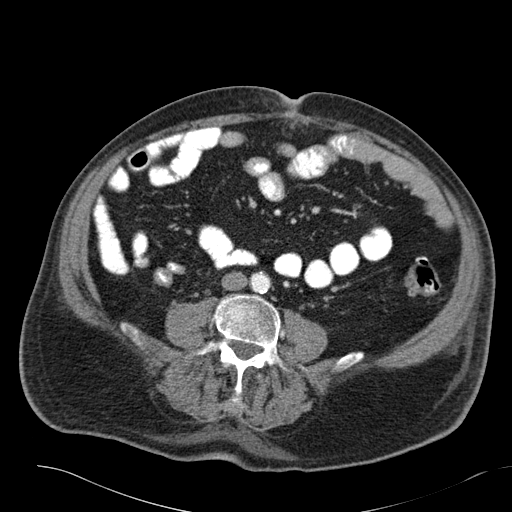
[im 81/153  soft-tissue]
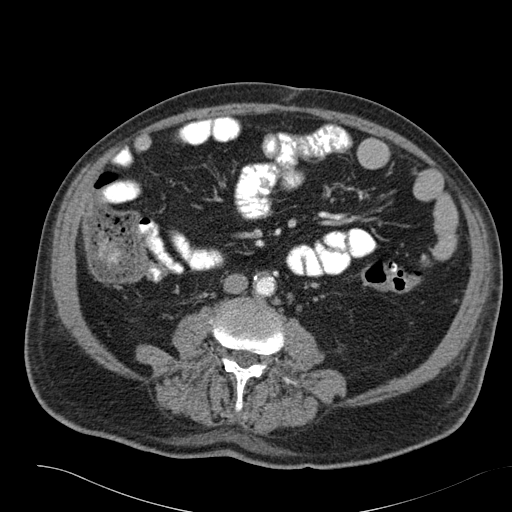
[im 97/153  soft-tissue]
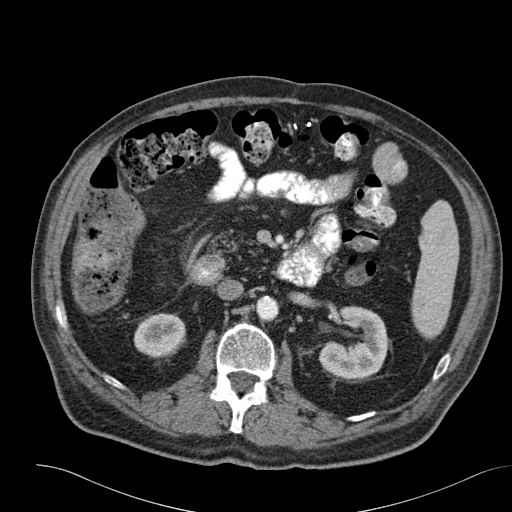
[im 105/153  soft-tissue]
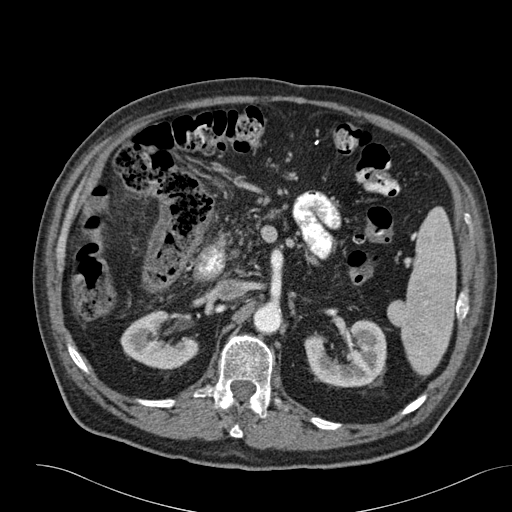
[im 105/153  bone]
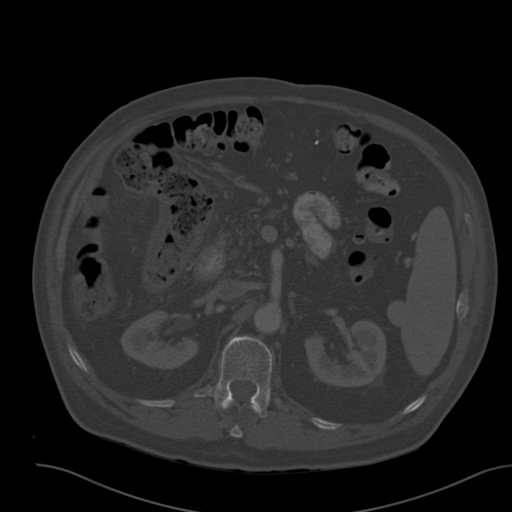
[im 121/153  soft-tissue]
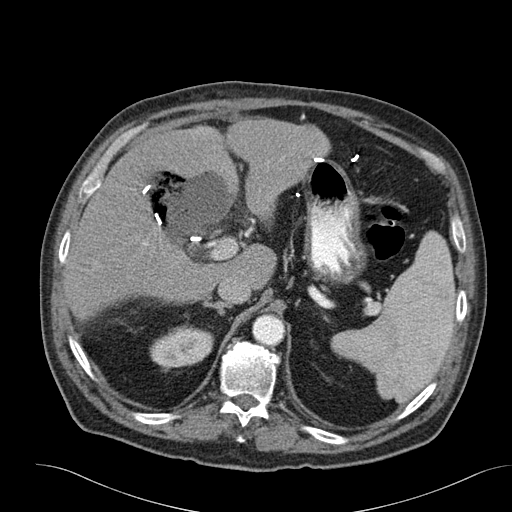
[im 121/153  lung]
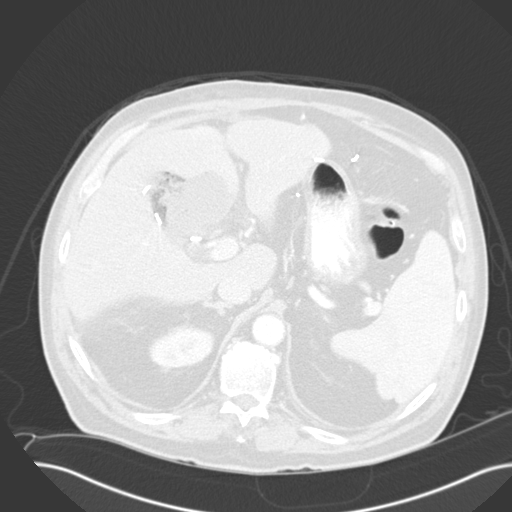
[im 129/153  soft-tissue]
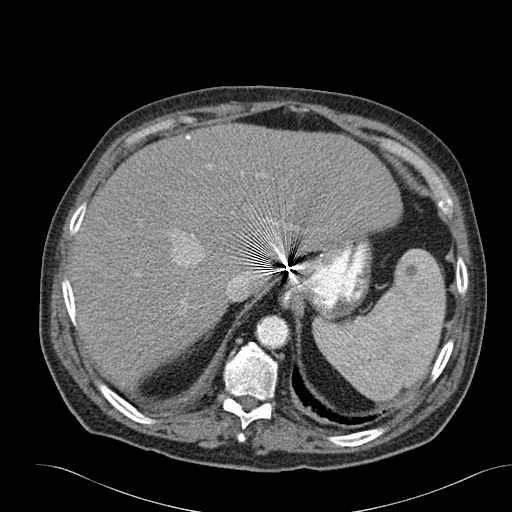
[im 129/153  lung]
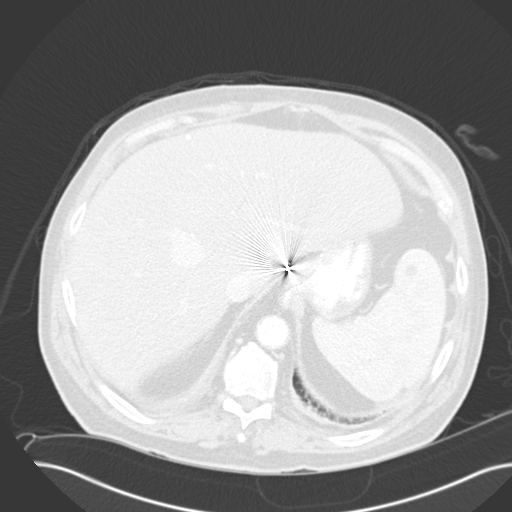
[im 137/153  lung]
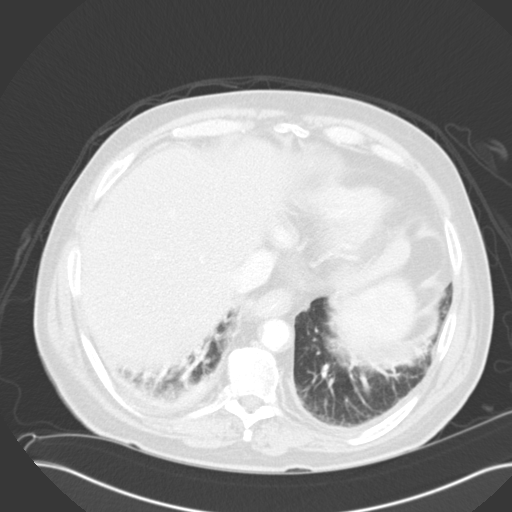
[im 145/153  soft-tissue]
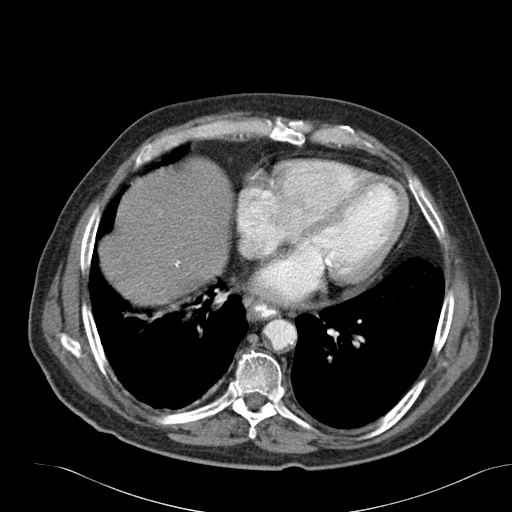
[im 145/153  lung]
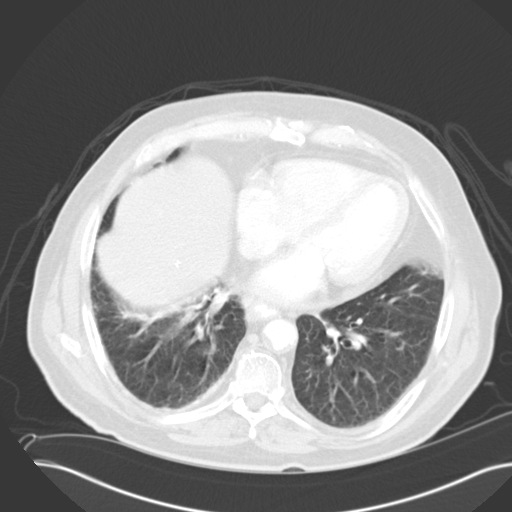

[13 of 32 positions shown; findings below may reference images not displayed]

FINDINGS: There is a trace right pleural effusion. Mild basilar opacities are likely
secondary to atelectasis. Mild prominence of the distal esophageal wall is
likely secondary to underdistention. Postoperative changes are seen about
the gastric body. There is a punctate calcification in the right hepatic
lobe which is likely sequela of old prior infection. The distal right
pulmonary vein is enlarged. There is a fluid and air collection in the
gallbladder fossa. It measures 6.5 x 6.1 cm. Surgical clips are seen from
prior cholecystectomy. No intrahepatic biliary duct dilatation. There is a
stent in the inferior common bile duct which extends into the duodenum.
Approximately half of the stent is located within the duodenum.

Small attenuation foci in the spleen are too small to characterize, but
similar to prior. The pancreas is relatively fatty replaced. The kidneys
enhance normally.

There is focal bowel wall thickening of the hepatic flexure of the colon.
This may be reactive. Injury to the colonic wall or colitis cannot be
excluded.

The prostate is enlarged. The small and large bowel are normal in caliber.
There is diverticulosis of the sigmoid and descending colon.

There is a mixed area of lucency and sclerosis in the left ischium which is
new from [DATE]. This is indeterminate. No obvious cortical destruction.
IMPRESSION: 1. Moderate sized fluid and air collection in the gallbladder fossa could be
secondary to postoperative changes. An abscess or biloma are not excluded.
2. Focal wall thickening of the hepatic flexure adjacent to the gallbladder
fossa may be reactive. Colitis or injury to the bowel wall is not excluded.
3. Approximately half of the common bile duct stent extends into the
duodenum. Clinical correlation is recommended.
4. There is an area of mixed lucency and sclerosis in the posterior left
ischium which is new from the CT of [NW]. This is indeterminate. Further
evaluation with MRI is suggested to evaluate for an osseous lesion.

## 2012-07-26 IMAGING — CR DG ABDOMEN 3V
1 series · 5 of 5 positions shown · non-contrast
Comparison: none

REASON FOR EXAM: abdominal distention, nausea post op for cholecystectomy
[REDACTED]CP
COMMENTS:   Bedside (portable):Y

[Series 1: erect ap · 0.17mm/px · 5 of 5 slices shown]
[im 1/5]
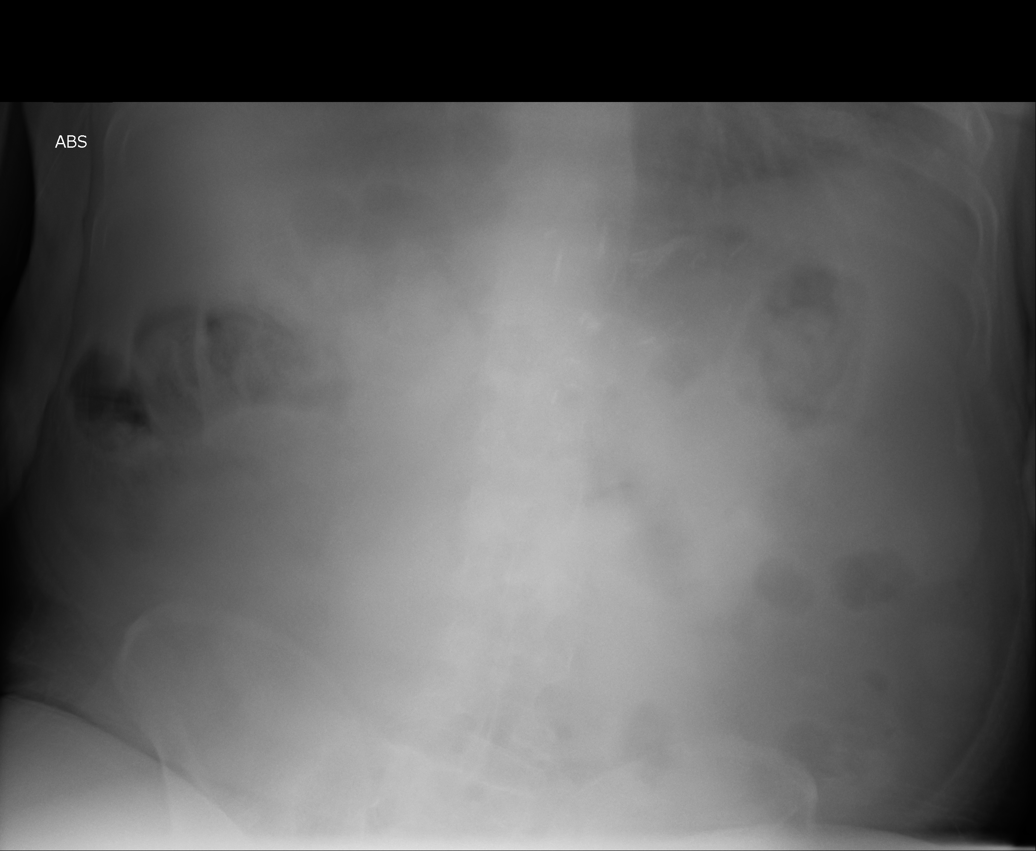
[im 2/5]
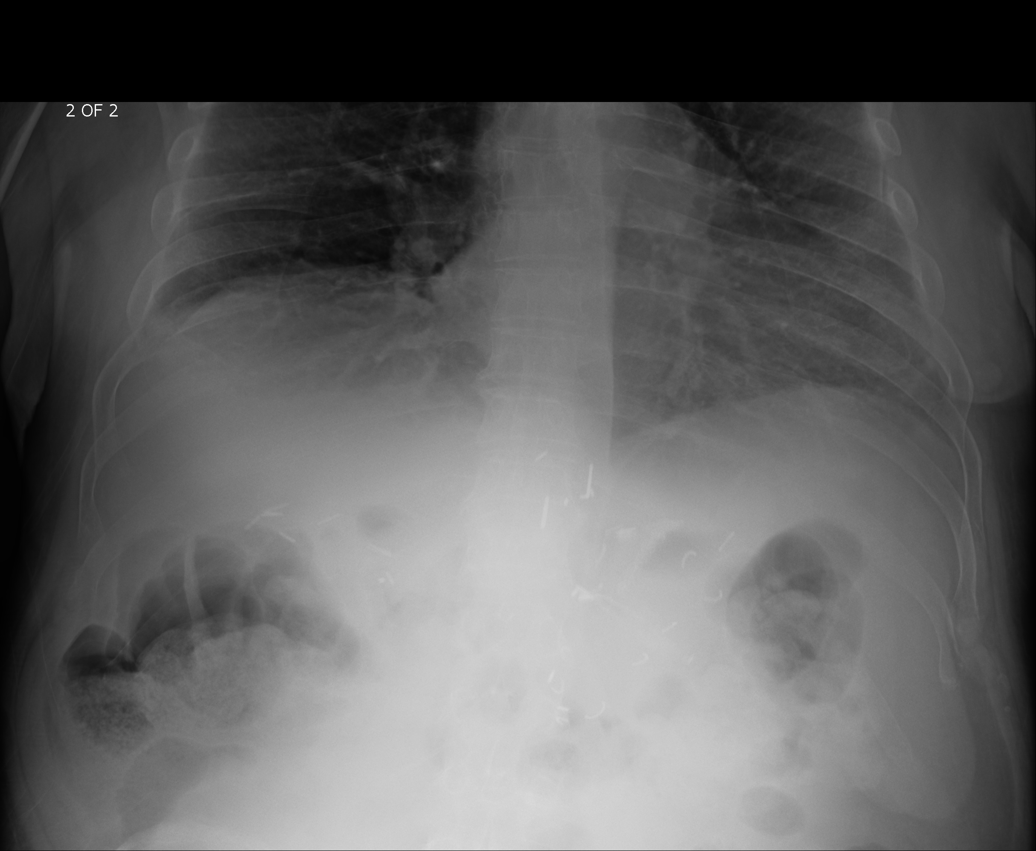
[im 3/5]
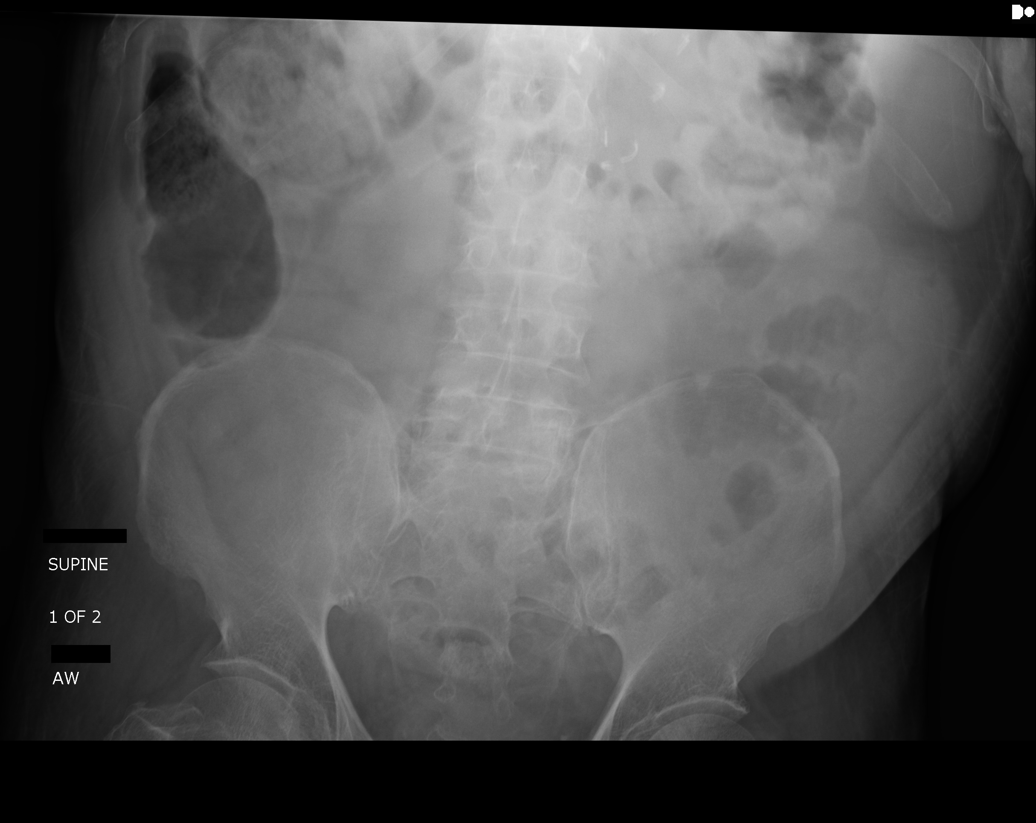
[im 4/5]
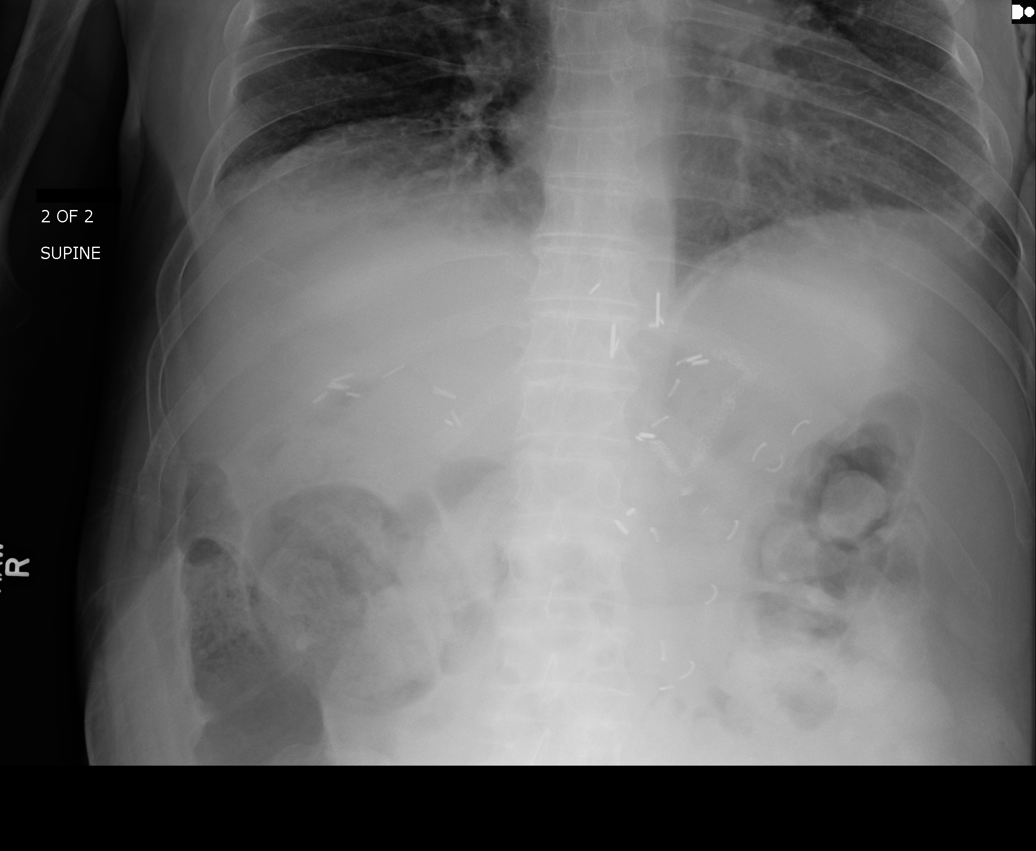
[im 5/5]
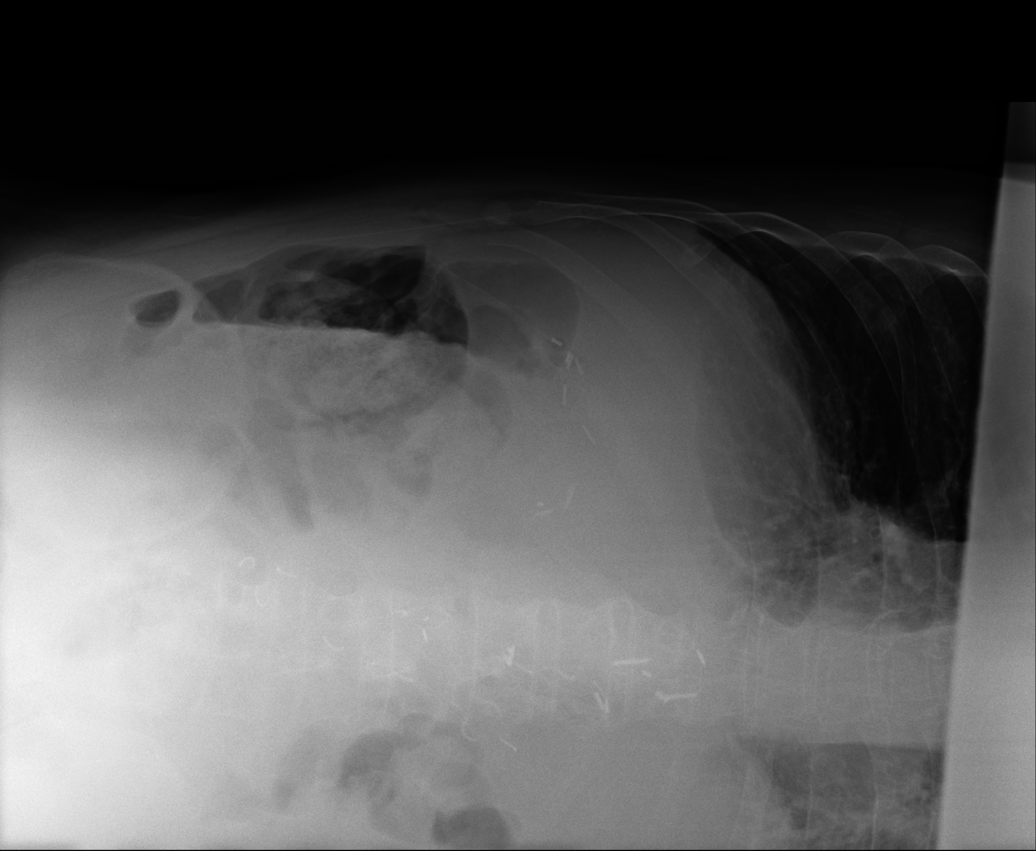

[5 of 5 positions shown; findings below may reference images not displayed]

PROCEDURE:     DXR - DXR ABDOMEN COMPLETE  - [DATE] [DATE]

RESULT:     Abdominal images are obtained. There is some motion artifact.
Study is degraded by the patient's body habitus. There multiple surgical
clips in the epigastric and upper mid abdominal region. There is air and
fecal material scattered through the colon and rectum without abnormal bowel
distention. Stones or not appreciated but the study has limited capability
to detect stones. No free air is evident.
IMPRESSION: No definite bowel obstruction or perforation. Surgical
changes are present. If the patient has persistent symptoms consider
ultrasound or CT followup for further assessment of abdominal symptoms.

[REDACTED]

## 2012-07-27 ENCOUNTER — Telehealth: Payer: Self-pay

## 2012-07-27 NOTE — Telephone Encounter (Signed)
lmtcb

## 2012-07-27 NOTE — Telephone Encounter (Signed)
I was able to speak with pt's dtr who says pt's home from hospital, was d/c today She says pt was d/c home on coreg and asks if this is the same "blood pressure medicine that caused him to fill up with fluid" in the past I explained, based on med list, both present and historical, pt has never been on coreg and is ok to take as prescribed at d/c He has been on cardizem in past dtr verb understanding

## 2012-07-27 NOTE — Telephone Encounter (Signed)
Pt wasjust released from the hospital and pt daughter has a question about a BP medication before she gets it filled

## 2012-07-30 ENCOUNTER — Telehealth: Payer: Self-pay

## 2012-07-30 ENCOUNTER — Telehealth: Payer: Self-pay | Admitting: Internal Medicine

## 2012-07-30 ENCOUNTER — Encounter: Payer: Self-pay | Admitting: *Deleted

## 2012-07-30 NOTE — Telephone Encounter (Signed)
TCM  

## 2012-07-30 NOTE — Telephone Encounter (Signed)
Britta Mccreedy pt daughter called wanting to  [facility]  [facility] discharged 4/18.  Pt had gallbladder problems.stint put in.  Please advise where I can put him in for an appointment. Britta Mccreedy stated his blood sugar is up running in the 200-300.   This morning it was 185

## 2012-07-30 NOTE — Telephone Encounter (Signed)
Patient contacted regarding discharge from [facility] on 07/27/12.  Patient understands to follow up with Dr. Mariah Milling on 08/07/12 at 1015 at [facility] office. Patient understands discharge instructions? yes Patient understands medications and regiment? yes Patient understands to bring all medications to this visit? yes  Pt is afebrile, denies pain and has been ambulating without symptoms. Asks when he may resume driving. I advised to resume per surgeon's instructions. He has appt with surg. This Friday and will address at that time

## 2012-07-30 NOTE — Telephone Encounter (Signed)
We can put him in on Friday morning at 8am for visit.

## 2012-07-30 NOTE — Telephone Encounter (Signed)
Message copied by [facility], Elanore Talcott E on Mon Jul 30, 2012 11:04 AM ------      Message from: Sandre Kitty F      Created: Mon Jul 30, 2012 10:35 AM      Regarding: tcm/ph       Pt scheduled for 4/29 at 10:15 ------

## 2012-07-30 NOTE — Telephone Encounter (Signed)
Fwd to Dr. Walker 

## 2012-07-31 LAB — COMPREHENSIVE METABOLIC PANEL
Albumin: 3.1 g/dL — ABNORMAL LOW (ref 3.4–5.0)
Albumin: 2.6 g/dL — ABNORMAL LOW (ref 3.4–5.0)
Alkaline Phosphatase: 198 U/L — ABNORMAL HIGH (ref 50–136)
Alkaline Phosphatase: 161 U/L — ABNORMAL HIGH (ref 50–136)
Anion Gap: 8 (ref 7–16)
BUN: 13 mg/dL (ref 7–18)
BUN: 14 mg/dL (ref 7–18)
Bilirubin,Total: 1.2 mg/dL — ABNORMAL HIGH (ref 0.2–1.0)
Calcium, Total: 8.3 mg/dL — ABNORMAL LOW (ref 8.5–10.1)
Chloride: 111 mmol/L — ABNORMAL HIGH (ref 98–107)
Co2: 22 mmol/L (ref 21–32)
Co2: 25 mmol/L (ref 21–32)
Co2: 26 mmol/L (ref 21–32)
Creatinine: 1.15 mg/dL (ref 0.60–1.30)
Creatinine: 1.11 mg/dL (ref 0.60–1.30)
EGFR (African American): >60
EGFR (Non-African Amer.): >60
Glucose: 194 mg/dL — ABNORMAL HIGH (ref 65–99)
Glucose: 215 mg/dL — ABNORMAL HIGH (ref 65–99)
Osmolality: 278 (ref 275–301)
Osmolality: 284 (ref 275–301)
Potassium: 4.4 mmol/L (ref 3.5–5.1)
Potassium: 3.7 mmol/L (ref 3.5–5.1)
Sodium: 133 mmol/L — ABNORMAL LOW (ref 136–145)
Total Protein: 6 g/dL — ABNORMAL LOW (ref 6.4–8.2)
Total Protein: 6.2 g/dL — ABNORMAL LOW (ref 6.4–8.2)

## 2012-07-31 NOTE — Telephone Encounter (Signed)
Spoke with patient daughter Britta Mccreedy and she confirmed appt time for Friday at 8:00

## 2012-08-01 LAB — CULTURE, BLOOD (SINGLE)

## 2012-08-03 ENCOUNTER — Ambulatory Visit (INDEPENDENT_AMBULATORY_CARE_PROVIDER_SITE_OTHER): Payer: Medicare Other | Admitting: Internal Medicine

## 2012-08-03 ENCOUNTER — Encounter: Payer: Self-pay | Admitting: Internal Medicine

## 2012-08-03 VITALS — BP 96/40 | HR 68 | Temp 97.8°F | Wt 162.0 lb

## 2012-08-03 DIAGNOSIS — Z09 Encounter for follow-up examination after completed treatment for conditions other than malignant neoplasm: Secondary | ICD-10-CM

## 2012-08-03 DIAGNOSIS — K810 Acute cholecystitis: Secondary | ICD-10-CM

## 2012-08-03 DIAGNOSIS — E1165 Type 2 diabetes mellitus with hyperglycemia: Secondary | ICD-10-CM

## 2012-08-03 DIAGNOSIS — K851 Biliary acute pancreatitis without necrosis or infection: Secondary | ICD-10-CM | POA: Insufficient documentation

## 2012-08-03 LAB — CBC WITH DIFFERENTIAL/PLATELET
Basophils Absolute: 0 10*3/uL (ref 0.0–0.1)
Eosinophils Absolute: 0.1 10*3/uL (ref 0.0–0.7)
MCHC: 34 g/dL (ref 30.0–36.0)
MCV: 86.3 fl (ref 78.0–100.0)
Monocytes Absolute: 0.9 10*3/uL (ref 0.1–1.0)
Neutrophils Relative %: 76.1 % (ref 43.0–77.0)
Platelets: 237 10*3/uL (ref 150.0–400.0)
WBC: 9.9 10*3/uL (ref 4.5–10.5)

## 2012-08-03 LAB — COMPREHENSIVE METABOLIC PANEL
AST: 24 U/L (ref 0–37)
Albumin: 3.4 g/dL — ABNORMAL LOW (ref 3.5–5.2)
Alkaline Phosphatase: 96 U/L (ref 39–117)
Glucose, Bld: 116 mg/dL — ABNORMAL HIGH (ref 70–99)
Potassium: 3.8 mEq/L (ref 3.5–5.1)
Sodium: 131 mEq/L — ABNORMAL LOW (ref 135–145)
Total Protein: 5.9 g/dL — ABNORMAL LOW (ref 6.0–8.3)

## 2012-08-03 LAB — LIPASE: Lipase: 23 U/L (ref 11.0–59.0)

## 2012-08-03 NOTE — Assessment & Plan Note (Signed)
Status post recent hospitalization from 07/18/2012 through 07/27/2012 for cholecystitis, gallstone pancreatitis requiring lap chole, CBD stent, complicated by CHF exacerbation. Doing well post-admission. No recurrent abdominal pain, nausea/vomiting. No recurrent dyspnea. BG improving after recent course of prednisone. Will continue to monitor. Hypoalbuminemia and mild anemia noted on labs, will plan recheck CMP and CBC in 1 week. Follow up with GI physician, general surgeon and cardiologist as scheduled.

## 2012-08-03 NOTE — Assessment & Plan Note (Signed)
BG improving after recent steroid taper. Will continue to monitor. Repeat A1c in 10/2012.

## 2012-08-03 NOTE — Progress Notes (Signed)
Subjective:    Patient ID: Matthew Miles, male    DOB: 11-03-1928, 79 y.o.   MRN: [id number]  HPI 79 year old male with history of diabetes, hypertension, coronary artery disease presents for followup after recent hospitalization. He was feeling well until Wednesday, April 9 when he developed sudden onset of epigastric abdominal pain. He was brought to the hospital via EMS where he reports that he started vomiting. Ultimately, he was found to have cholecystitis and gallstone pancreatitis. He underwent cholecystectomy and EGD with stent placement in the common bile duct. He was discharged 08/06/2012. He reports that symptoms of nausea and vomiting have completely resolved. He is tolerating a full diet. He reports his energy level is back to normal. He denies any abdominal pain. He denies any shortness of breath or chest pain. He denies any fever or chills. He reports that surgical incision sites are healing well and Steri-Strips intact. He has followup with his surgeon today.  In regards to diabetes, he reports that blood sugars have been improving, with fasting sugars typically near 130. His BG were elevated during his hospitalization while on prednisone.  He has scheduled follow up for diabetic teaching and endocrinology evaluation.   Outpatient Encounter Prescriptions as of 08/03/2012  Medication Sig Dispense Refill  . albuterol (PROVENTIL) (2.5 MG/3ML) 0.083% nebulizer solution Take 2.5 mg by nebulization every 6 (six) hours as needed.      Marland Kitchen albuterol (VENTOLIN HFA) 108 (90 BASE) MCG/ACT inhaler Inhale 2 puffs into the lungs every 4 (four) hours as needed.  18 g  3  . aspirin 81 MG tablet Take 81 mg by mouth daily.      . Blood Glucose Monitoring Suppl (ONE TOUCH ULTRA SYSTEM KIT) W/DEVICE KIT 1 kit by Does not apply route once.  1 each  0  . carvedilol (COREG) 3.125 MG tablet Take 3.125 mg by mouth 2 (two) times daily with a meal.       . glipiZIDE (GLUCOTROL XL) 5 MG 24 hr tablet Take 1 tablet  (5 mg total) by mouth daily.  30 tablet  6  . glucose blood test strip One touch ultra test strips. Use to check blood sugar two times a day. Dx. 250.00  100 each  11  . KLOR-CON M20 20 MEQ tablet TAKE 1 TABLET BY MOUTH EVERY DAY  30 tablet  6  . NITROSTAT 0.4 MG SL tablet 500 each as needed.      . nystatin-triamcinolone (MYCOLOG II) cream       . omeprazole (PRILOSEC) 20 MG capsule Take 1 capsule (20 mg total) by mouth 2 (two) times daily. Twice daily before meals  180 capsule  4  . pravastatin (PRAVACHOL) 40 MG tablet Take 1 tablet (40 mg total) by mouth daily.  90 tablet  4  . torsemide (DEMADEX) 20 MG tablet Take 1 tablet (20 mg total) by mouth 2 (two) times daily.  60 tablet  3  . Ferrous Sulfate (IRON SUPPLEMENT PO) Take 65 mg by mouth daily.      Marland Kitchen tiotropium (SPIRIVA) 18 MCG inhalation capsule Place 18 mcg into inhaler and inhale daily.       No facility-administered encounter medications on file as of 08/03/2012.   BP 96/40  Pulse 68  Temp(Src) 97.8 F (36.6 C) (Oral)  Wt 162 lb (73.483 kg)  BMI 29.62 kg/m2  SpO2 95%  Review of Systems  Constitutional: Negative for fever, chills, activity change, appetite change, fatigue and unexpected weight change.  Eyes: Negative  for visual disturbance.  Respiratory: Negative for cough and shortness of breath.   Cardiovascular: Negative for chest pain, palpitations and leg swelling.  Gastrointestinal: Negative for abdominal pain and abdominal distention.  Genitourinary: Negative for dysuria, urgency and difficulty urinating.  Musculoskeletal: Negative for arthralgias and gait problem.  Skin: Negative for color change and rash.  Hematological: Negative for adenopathy.  Psychiatric/Behavioral: Negative for sleep disturbance and dysphoric mood. The patient is not nervous/anxious.        Objective:   Physical Exam  Constitutional: He is oriented to person, place, and time. He appears well-developed and well-nourished. No distress.  HENT:   Head: Normocephalic and atraumatic.  Right Ear: External ear normal.  Left Ear: External ear normal.  Nose: Nose normal.  Mouth/Throat: Oropharynx is clear and moist. No oropharyngeal exudate.  Eyes: Conjunctivae and EOM are normal. Pupils are equal, round, and reactive to light. Right eye exhibits no discharge. Left eye exhibits no discharge. No scleral icterus.  Neck: Normal range of motion. Neck supple. No tracheal deviation present. No thyromegaly present.  Cardiovascular: Normal rate, regular rhythm and normal heart sounds.  Exam reveals no gallop and no friction rub.   No murmur heard. Pulmonary/Chest: Effort normal and breath sounds normal. No respiratory distress. He has no wheezes. He has no rales. He exhibits no tenderness.  Abdominal: Soft. Normal appearance and bowel sounds are normal. He exhibits no distension. There is no tenderness.    Musculoskeletal: Normal range of motion. He exhibits no edema.  Lymphadenopathy:    He has no cervical adenopathy.  Neurological: He is alert and oriented to person, place, and time. No cranial nerve deficit. Coordination normal.  Skin: Skin is warm and dry. No rash noted. He is not diaphoretic. No erythema. No pallor.  Psychiatric: He has a normal mood and affect. His behavior is normal. Judgment and thought content normal.          Assessment & Plan:

## 2012-08-03 NOTE — Assessment & Plan Note (Signed)
Status post admission for gallstone pancreatitis requiring stent CBD. Doing well with no recurrent abdominal pain, nausea, or vomiting. Lipase normal today. We'll continue to monitor.

## 2012-08-03 NOTE — Assessment & Plan Note (Signed)
Status post recent hospitalization requiring lap cholecystectomy. Doing very well. No recurrent abdominal pain or food intolerance. Will continue to monitor.

## 2012-08-06 ENCOUNTER — Encounter: Payer: Self-pay | Admitting: Cardiovascular Disease

## 2012-08-06 ENCOUNTER — Ambulatory Visit (INDEPENDENT_AMBULATORY_CARE_PROVIDER_SITE_OTHER): Payer: Medicare Other | Admitting: Cardiovascular Disease

## 2012-08-06 VITALS — BP 102/55 | HR 89 | Ht 62.0 in | Wt 165.5 lb

## 2012-08-06 DIAGNOSIS — I2789 Other specified pulmonary heart diseases: Secondary | ICD-10-CM

## 2012-08-06 DIAGNOSIS — E1165 Type 2 diabetes mellitus with hyperglycemia: Secondary | ICD-10-CM

## 2012-08-06 DIAGNOSIS — I5030 Unspecified diastolic (congestive) heart failure: Secondary | ICD-10-CM

## 2012-08-06 DIAGNOSIS — I272 Pulmonary hypertension, unspecified: Secondary | ICD-10-CM

## 2012-08-06 DIAGNOSIS — I2510 Atherosclerotic heart disease of native coronary artery without angina pectoris: Secondary | ICD-10-CM

## 2012-08-06 DIAGNOSIS — I509 Heart failure, unspecified: Secondary | ICD-10-CM

## 2012-08-06 DIAGNOSIS — R000 Tachycardia, unspecified: Secondary | ICD-10-CM

## 2012-08-06 NOTE — Progress Notes (Signed)
Patient ID: Matthew Miles, male    DOB: [birth year], 79 y.o.   MRN: [id number]  HPI Comments: Matthew Miles is a 79 yo-year-old gentleman with history of coronary artery disease, moderate pulmonary hypertension in early 2013, previous MI with occluded LAD, long history of smoking for 60 years, history of bronchitis requiring several courses of antibiotics at the end of 2012, prednisone and nebulizers with admission to the hospital in December for hematemesis and gastric ulcer seen on EGD, found to have COPD and oxygenation down to 83% with ambulation, nonsustained VT per the notes, acute renal failure who presents for followup.  Recent left and right heart cath done in February 2013 showing pulmonary hypertension, stable severe coronary artery disease as below. Normal ejection fraction  Right heart catheterization showed significant fluid overload with wedge pressure of 24, moderate pulmonary hypertension. Catheterization showed occluded LAD which was old, no other significant stenoses requiring intervention.  Weight has dropped significantly in the past several months. Lost significant fluids now with no edema, significantly improved shortness of breath. Blood pressure has been low though he denies any symptoms. Blood pressure 2 days ago in the morning was 90 systolic. Continues to take diuretic pill daily.  Diabetes control has been better recently. Overall feels well  EKG shows normal sinus rhythm with rate 89 beats per minute with APCs, left anterior fascicular block, old anterior infarct       Outpatient Encounter Prescriptions as of 08/06/2012  Medication Sig Dispense Refill  . albuterol (PROVENTIL) (2.5 MG/3ML) 0.083% nebulizer solution Take 2.5 mg by nebulization every 6 (six) hours as needed.      Marland Kitchen albuterol (VENTOLIN HFA) 108 (90 BASE) MCG/ACT inhaler Inhale 2 puffs into the lungs every 4 (four) hours as needed.  18 g  3  . aspirin 81 MG tablet Take 81 mg by mouth daily.      .  Blood Glucose Monitoring Suppl (ONE TOUCH ULTRA SYSTEM KIT) W/DEVICE KIT 1 kit by Does not apply route once.  1 each  0  . carvedilol (COREG) 3.125 MG tablet Take 3.125 mg by mouth 2 (two) times daily with a meal.       . glipiZIDE (GLUCOTROL XL) 5 MG 24 hr tablet Take 1 tablet (5 mg total) by mouth daily.  30 tablet  6  . glucose blood test strip One touch ultra test strips. Use to check blood sugar two times a day. Dx. 250.00  100 each  11  . KLOR-CON M20 20 MEQ tablet TAKE 1 TABLET BY MOUTH EVERY DAY  30 tablet  6  . NITROSTAT 0.4 MG SL tablet 500 each as needed.      . nystatin-triamcinolone (MYCOLOG II) cream       . omeprazole (PRILOSEC) 20 MG capsule Take 1 capsule (20 mg total) by mouth 2 (two) times daily. Twice daily before meals  180 capsule  4  . pravastatin (PRAVACHOL) 40 MG tablet Take 1 tablet (40 mg total) by mouth daily.  90 tablet  4  . torsemide (DEMADEX) 20 MG tablet Take 20 mg by mouth 2 (two) times daily as needed.       Review of Systems  HENT: Negative.   Eyes: Negative.   Gastrointestinal: Negative.   Musculoskeletal: Negative.   Skin: Negative.   Neurological: Negative.   Psychiatric/Behavioral: Negative.   All other systems reviewed and are negative.   BP 102/55  Pulse 89  Ht 5\' 2"  (1.575 m)  Wt 165 lb 8 oz (  75.07 kg)  BMI 30.26 kg/m2  Physical Exam  Nursing note and vitals reviewed. Constitutional: He is oriented to person, place, and time. He appears well-developed and well-nourished.  HENT:  Head: Normocephalic.  Nose: Nose normal.  Mouth/Throat: Oropharynx is clear and moist.  Eyes: Conjunctivae are normal. Pupils are equal, round, and reactive to light.  Neck: Normal range of motion. Neck supple. No JVD present.  Cardiovascular: Normal rate, regular rhythm, S1 normal, S2 normal and intact distal pulses.  Frequent extrasystoles are present. Exam reveals no gallop and no friction rub.   Murmur heard.  Systolic murmur is present with a grade of 2/6   Trace edema around the ankles  Pulmonary/Chest: Effort normal and breath sounds normal. No respiratory distress. He has no wheezes. He has no rales. He exhibits no tenderness.  Abdominal: Soft. Bowel sounds are normal. He exhibits no distension. There is no tenderness.  Musculoskeletal: Normal range of motion. He exhibits no edema and no tenderness.  Lymphadenopathy:    He has no cervical adenopathy.  Neurological: He is alert and oriented to person, place, and time. Coordination normal.  Skin: Skin is warm and dry. No rash noted. No erythema.  Psychiatric: He has a normal mood and affect. His behavior is normal. Judgment and thought content normal.      Assessment and Plan

## 2012-08-06 NOTE — Patient Instructions (Addendum)
You are doing well. Take the diuretic 5 days a week Continue on the coreg in the AM only   Keep you weight 159 or less  Please call us if you have new issues that need to be addressed before your next appt.  Your physician wants you to follow-up in: 3 months.  You will receive a reminder letter in the mail two months in advance. If you don't receive a letter, please call our office to schedule the follow-up appointment.

## 2012-08-06 NOTE — Assessment & Plan Note (Signed)
Currently with no symptoms of angina. No further workup at this time. Continue current medication regimen. 

## 2012-08-06 NOTE — Assessment & Plan Note (Signed)
He'll continue on Coreg 3.125 mg in the morning. We'll hold the evening dose given his low blood pressure

## 2012-08-06 NOTE — Assessment & Plan Note (Signed)
Significantly improved fluid status. Recent climb in his BUN. Will likely have improved symptom relief with slightly high BUN and creatinine. Currently feels well

## 2012-08-06 NOTE — Assessment & Plan Note (Signed)
Weight has significantly dropped. We have suggested he try to keep his weight less than 160 but above 153 pounds at home. Weight at home today 156 pounds. He'll continue on his diuretic sparingly, higher doses for increased weight gain.

## 2012-08-06 NOTE — Assessment & Plan Note (Signed)
We have encouraged continued exercise, careful diet management in an effort to lose weight. 

## 2012-08-07 ENCOUNTER — Ambulatory Visit: Payer: Medicare Other | Admitting: Cardiovascular Disease

## 2012-08-09 ENCOUNTER — Telehealth: Payer: Self-pay | Admitting: Internal Medicine

## 2012-08-09 ENCOUNTER — Emergency Department: Payer: Self-pay | Admitting: Emergency Medicine

## 2012-08-09 LAB — COMPREHENSIVE METABOLIC PANEL
Albumin: 2.8 g/dL — ABNORMAL LOW (ref 3.4–5.0)
Alkaline Phosphatase: 150 U/L — ABNORMAL HIGH (ref 50–136)
Anion Gap: 6 — ABNORMAL LOW (ref 7–16)
BUN: 14 mg/dL (ref 7–18)
Bilirubin,Total: 0.8 mg/dL (ref 0.2–1.0)
Chloride: 98 mmol/L (ref 98–107)
Co2: 27 mmol/L (ref 21–32)
Creatinine: 1.06 mg/dL (ref 0.60–1.30)
EGFR (African American): >60
Potassium: 5.1 mmol/L (ref 3.5–5.1)
SGOT(AST): 29 U/L (ref 15–37)
Sodium: 131 mmol/L — ABNORMAL LOW (ref 136–145)

## 2012-08-09 LAB — CK TOTAL AND CKMB (NOT AT ARMC)
CK, Total: 14 U/L — ABNORMAL LOW (ref 35–232)
CK-MB: <0.5 ng/mL — ABNORMAL LOW (ref 0.5–3.6)

## 2012-08-09 LAB — CBC
HCT: 32.9 % — ABNORMAL LOW (ref 40.0–52.0)
HGB: 11.2 g/dL — ABNORMAL LOW (ref 13.0–18.0)
MCHC: 33.9 g/dL (ref 32.0–36.0)
RBC: 3.86 10*6/uL — ABNORMAL LOW (ref 4.40–5.90)
WBC: 10 10*3/uL (ref 3.8–10.6)

## 2012-08-09 LAB — URINALYSIS, COMPLETE
Bacteria: NONE SEEN
Bilirubin,UR: NEGATIVE
Ketone: NEGATIVE
Ph: 5 (ref 4.5–8.0)
Protein: 30
RBC,UR: 1 /HPF (ref 0–5)
Specific Gravity: 1.017 (ref 1.003–1.030)
Squamous Epithelial: <1

## 2012-08-09 LAB — TROPONIN I: Troponin-I: <0.02 ng/mL

## 2012-08-09 NOTE — Telephone Encounter (Signed)
Called and spoke with Matthew Miles about Matthew Miles, she had left a message on the voicemail. She state patient went to Dr. Cornelius Moras on Monday and they called them today about his bloodwork. His WBC was low and patient was running a fever. Per Matthew Miles he was not aware he was sick and did not know he had a fever but they want him to be checked out by his PCP. Also informed her again that I called last week and spoke to her brother about patient coming back in office to have repeat labs done and they were to call back to let me know when he would come in but I never heard anything from her. She stated they were going to wait until next week but now he has been scheduled for an appointment with Dr. Dan Humphreys tomorrow.

## 2012-08-09 NOTE — Telephone Encounter (Signed)
Spoke with Britta Mccreedy, informed her of Dr. Tilman Neat plan. She stated she would see if he is willing to go to ED.

## 2012-08-09 NOTE — Telephone Encounter (Signed)
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  Patient Will Follow Care Advice: Verbalizes understanding  Appointment Scheduled: 08/10/2012 11:15:00; Provider:  Ronna Polio

## 2012-08-09 NOTE — Telephone Encounter (Signed)
If he has low WBC and fever, he needs to be seen immediately.  Probably the fastest way to repeat blood work and get evaluation at this point would be through the ED>

## 2012-08-10 ENCOUNTER — Ambulatory Visit: Payer: Self-pay | Admitting: Internal Medicine

## 2012-08-10 IMAGING — CR DG CHEST 2V
1 series · 3 of 3 positions shown · non-contrast
Comparison: none

REASON FOR EXAM: generalized weakness and fever, somnolent
COMMENTS:

PROCEDURE:     DXR - DXR CHEST PA (OR AP) AND LATERAL  - [DATE] [DATE]
RESULT:     Comparison: [DATE]

[Series 1: w chest pa · 0.14mm/px · 3 of 3 slices shown]
[im 1/3]
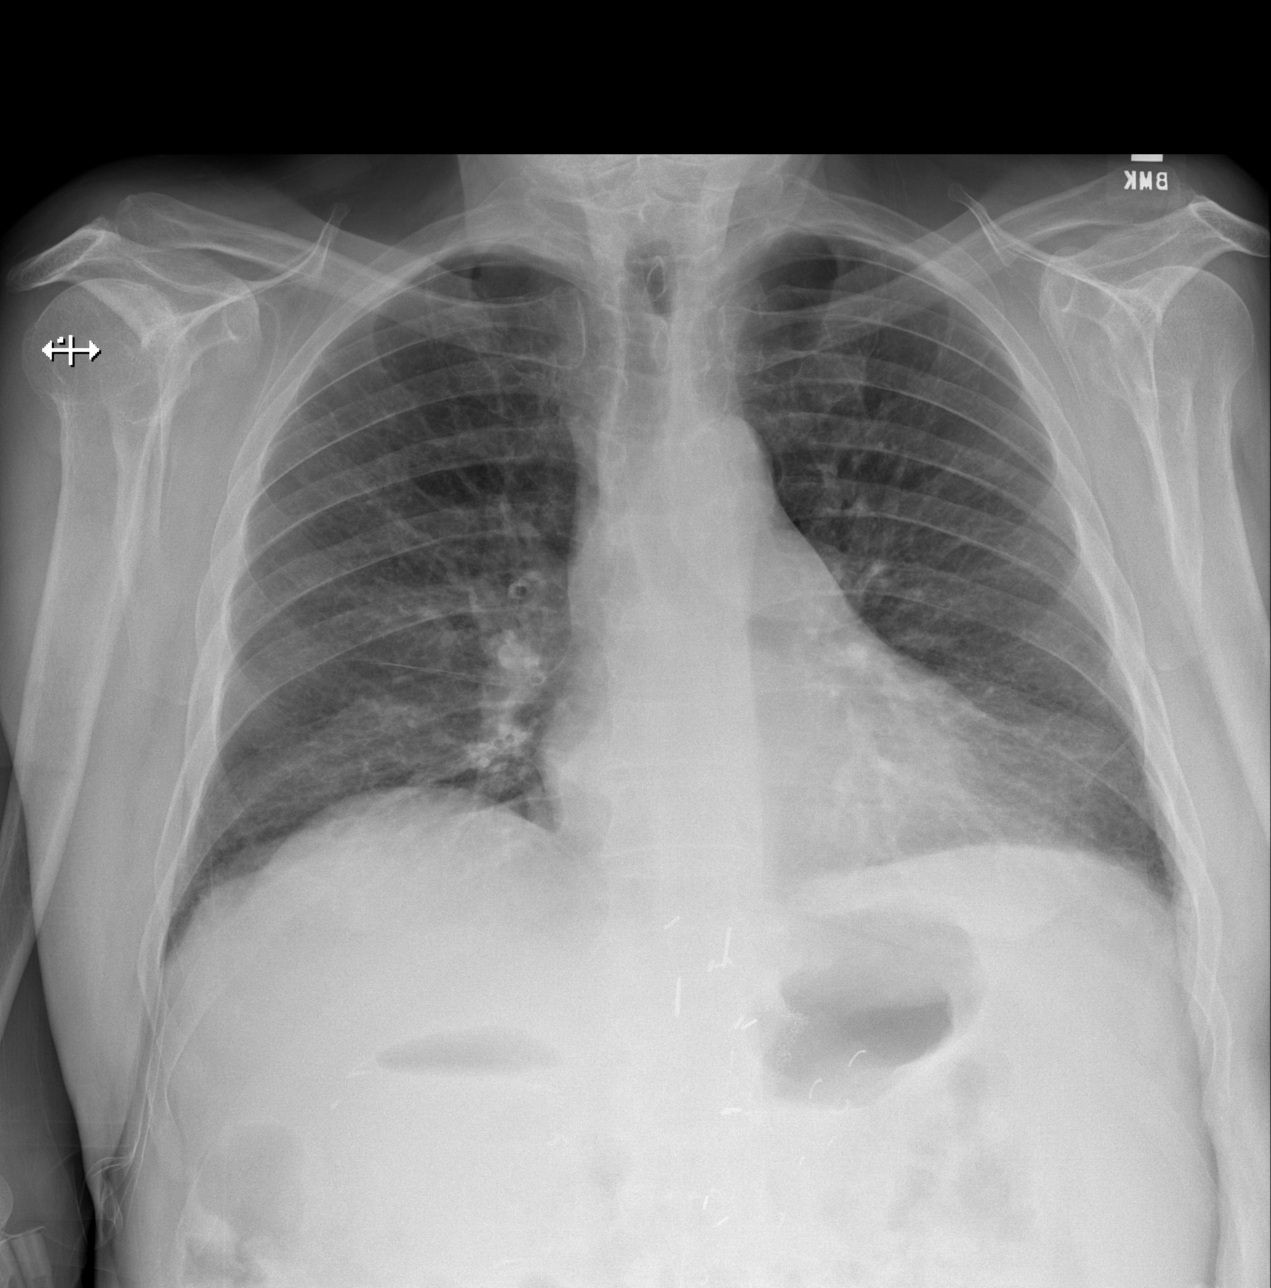
[im 2/3]
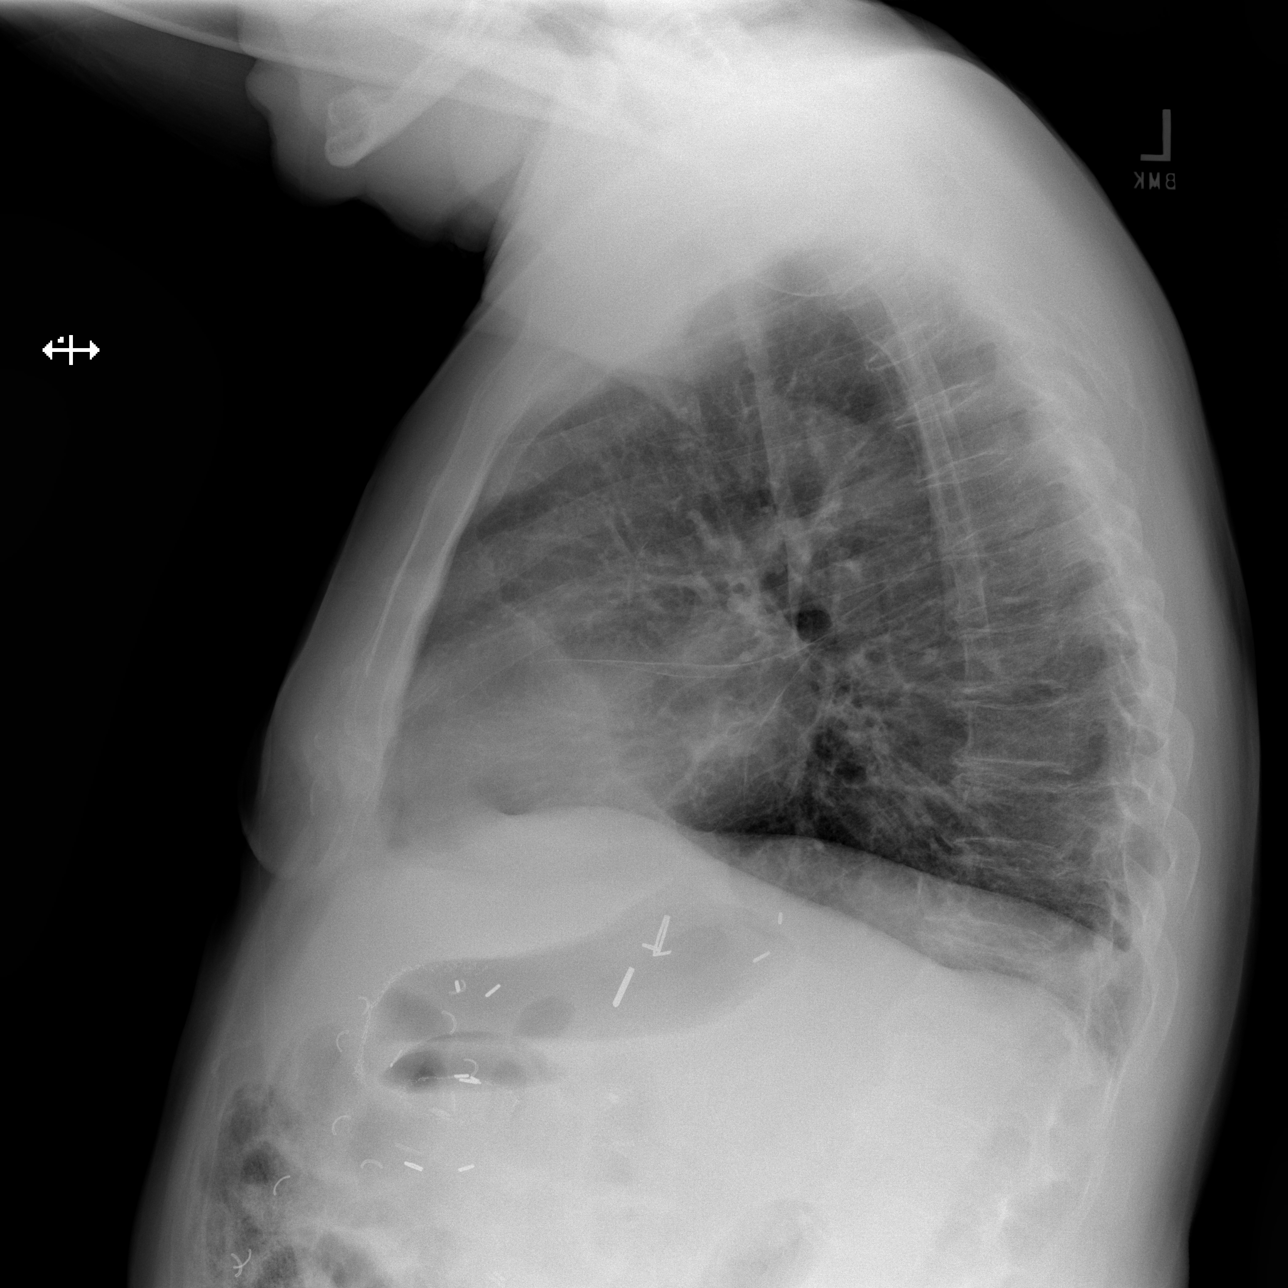
[im 3/3]
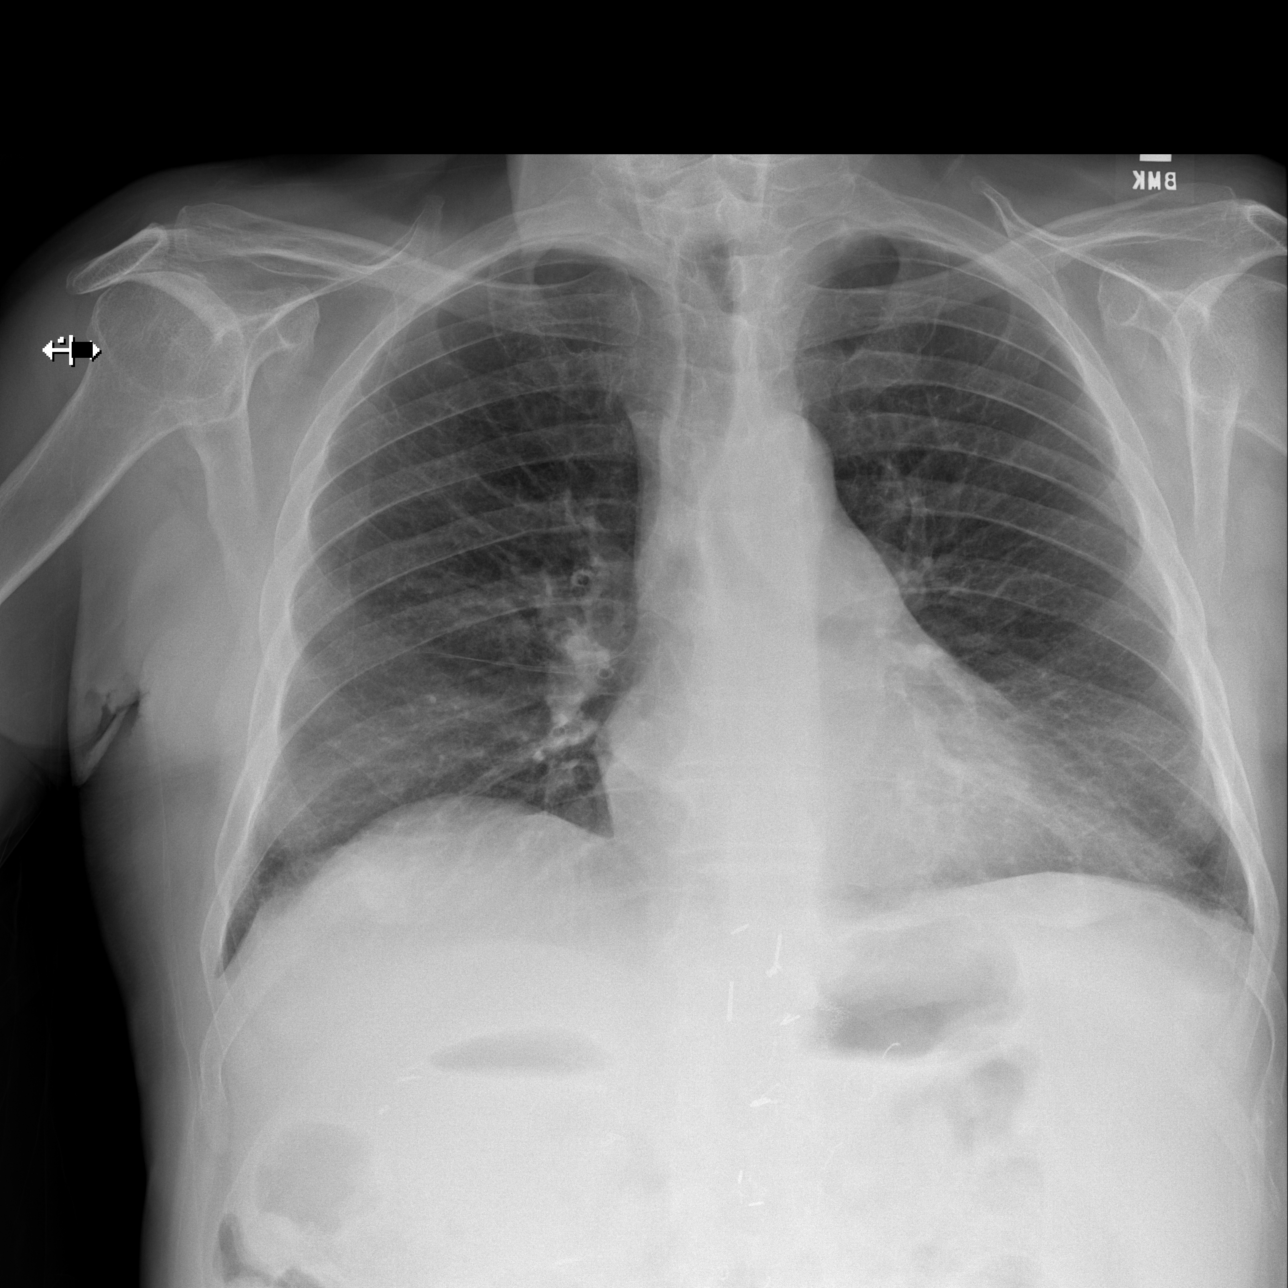

[3 of 3 positions shown; findings below may reference images not displayed]

FINDINGS: PA and lateral chest radiographs are provided.  There is no focal
parenchymal opacity, pleural effusion, or pneumothorax. The heart and
mediastinum are unremarkable.  The osseous structures are unremarkable.
IMPRESSION: No acute disease of the che[REDACTED]

## 2012-08-10 IMAGING — CT CT HEAD WITHOUT CONTRAST
1 series · 15 of 30 positions shown, 19 images · non-contrast
Comparison: none

REASON FOR EXAM: generalized weakness and fever, somnolent
COMMENTS:

[Series 2: soft tissue · axial · 0.41mm/px · z∈[+221,+356]mm · 15 of 31 slices shown, 19 images]
[im 2/31  brain]
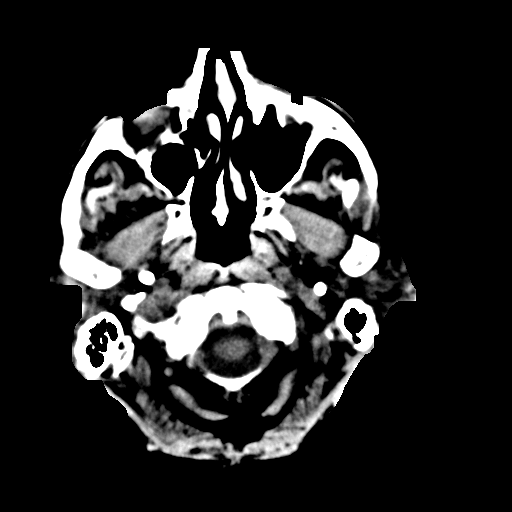
[im 2/31  bone]
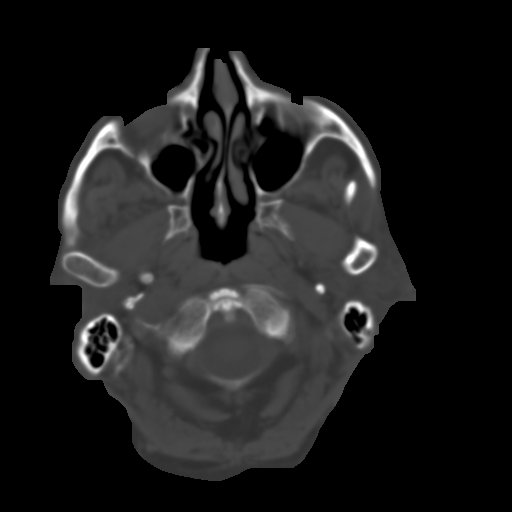
[im 4/31  brain]
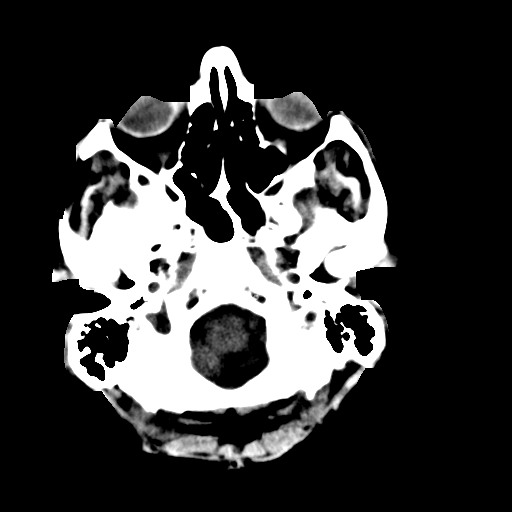
[im 6/31  brain]
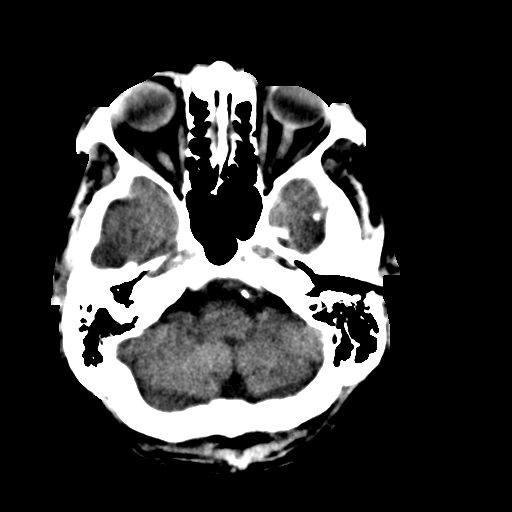
[im 8/31  brain]
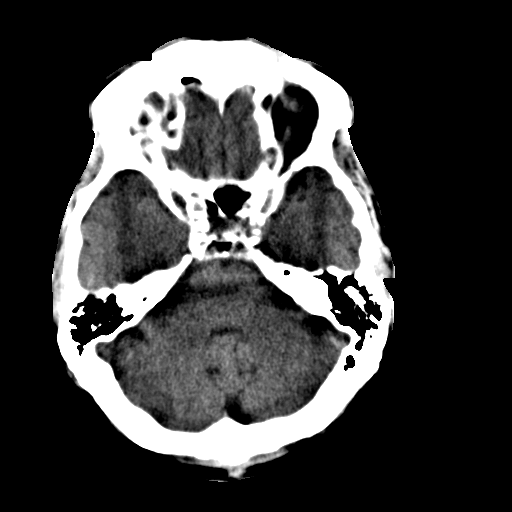
[im 10/31  brain]
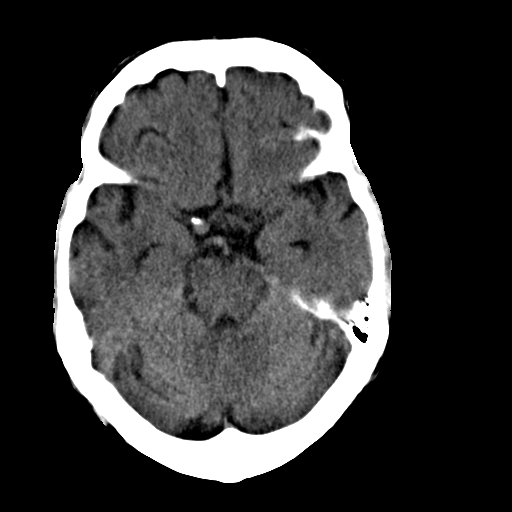
[im 10/31  bone]
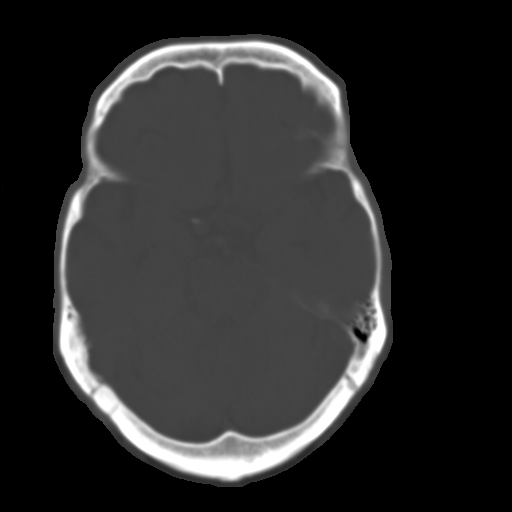
[im 12/31  brain]
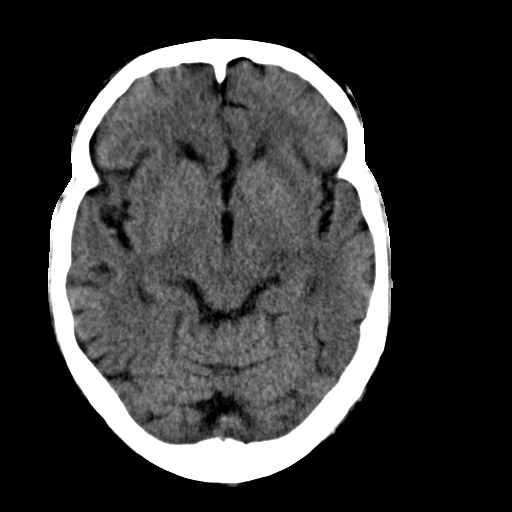
[im 14/31  brain]
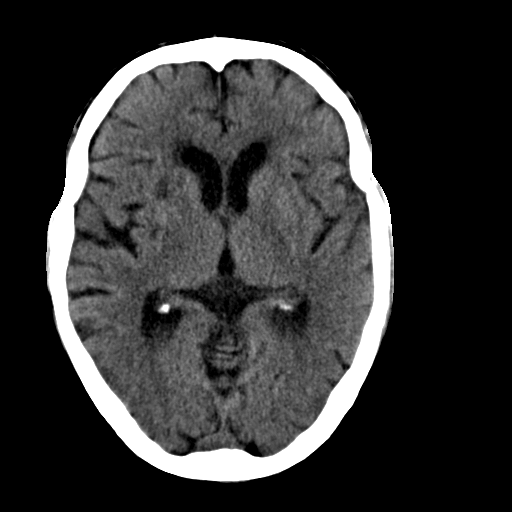
[im 16/31  brain]
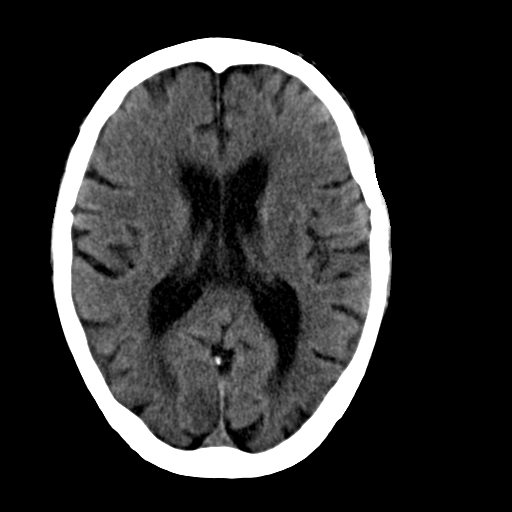
[im 17/31  brain]
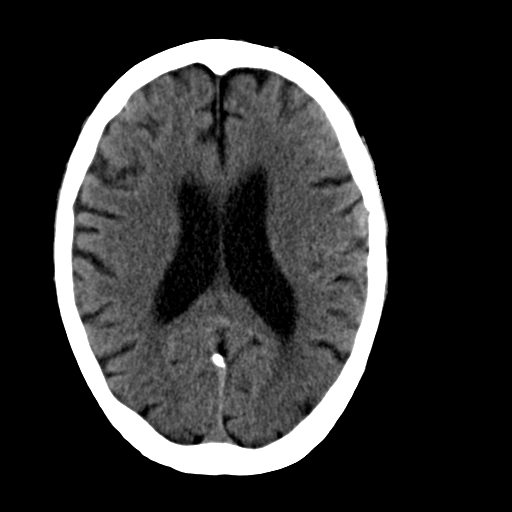
[im 17/31  bone]
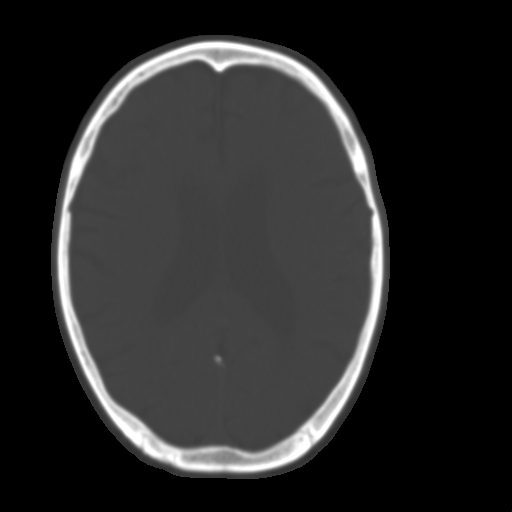
[im 19/31  brain]
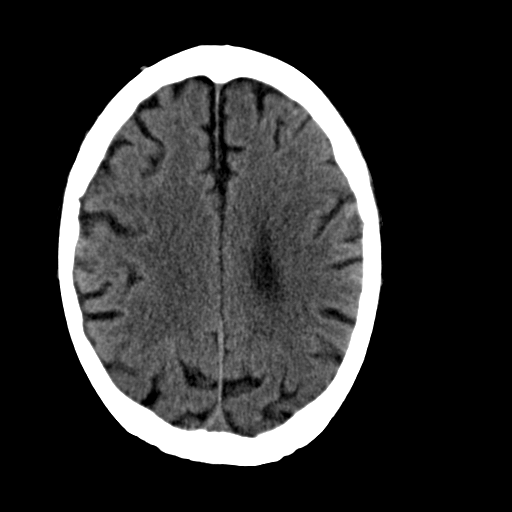
[im 21/31  brain]
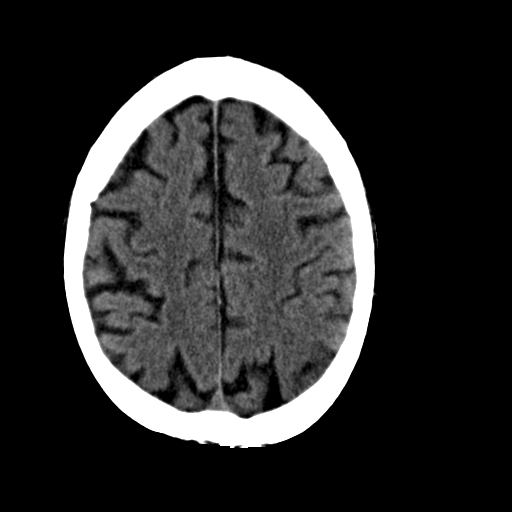
[im 23/31  brain]
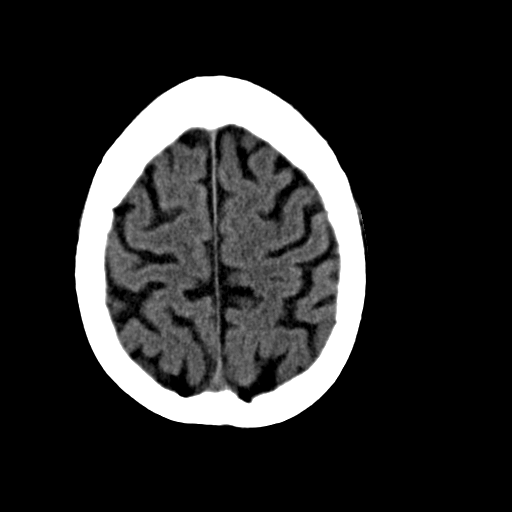
[im 25/31  brain]
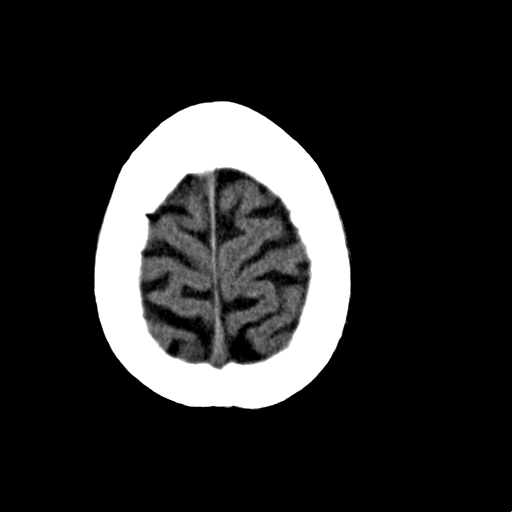
[im 25/31  bone]
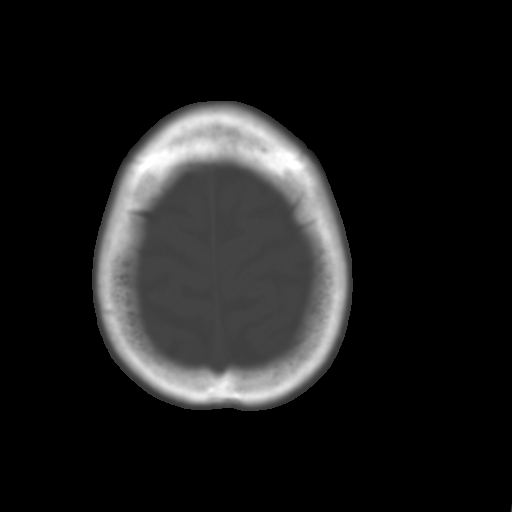
[im 27/31  brain]
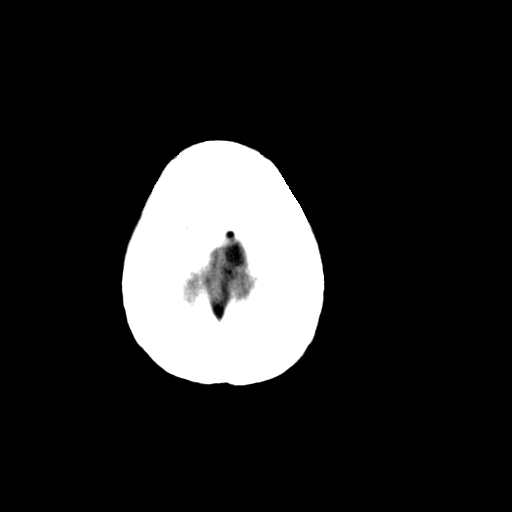
[im 29/31  brain]
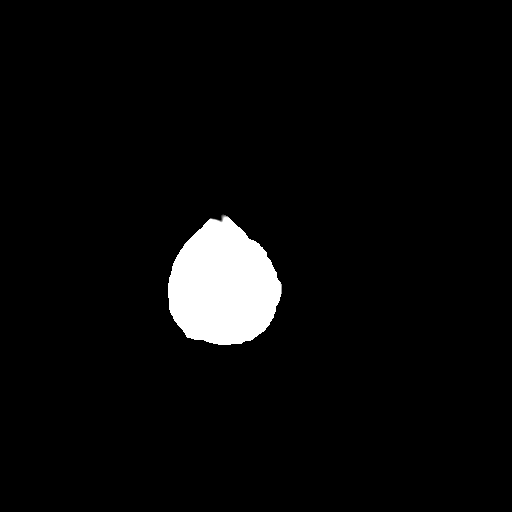

[15 of 30 positions shown; findings below may reference images not displayed]

PROCEDURE:     CT  - CT HEAD WITHOUT CONTRAST  - [DATE] [DATE]

RESULT:     Axial noncontrast CT scanning was performed through the brain
with reconstructions at 5 mm intervals and slice thicknesses.

There is mild age-appropriate diffuse cerebral and cerebellar atrophy with
compensatory ventriculomegaly. There is an old lacunar infarction anteriorly
in the right basal ganglia. Subtle areas of decreased density elsewhere in
both basal ganglia are consistent with old tiny lacunar infarctions. There
is no evidence of an acute intracranial hemorrhage nor of an evolving
ischemic event. The cerebellum and brainstem exhibit no acute abnormalities.
At bone window settings the observed portions of the paranasal sinuses and
mastoid air cells are clear. There is no evidence of an acute skull fracture.
IMPRESSION: 1. There is no evidence of an acute ischemic or hemorrhagic event.
2. There are age related atrophic changes as well as findings consistent
with chronic small vessel ischemic type change.

A preliminary report was sent to the [HOSPITAL] the conclusion
of the study.

[REDACTED]

## 2012-08-13 ENCOUNTER — Other Ambulatory Visit: Payer: Self-pay | Admitting: Internal Medicine

## 2012-08-14 ENCOUNTER — Telehealth: Payer: Self-pay | Admitting: Internal Medicine

## 2012-08-14 ENCOUNTER — Ambulatory Visit: Payer: Medicare Other | Admitting: Pulmonary Disease

## 2012-08-14 ENCOUNTER — Encounter: Payer: Self-pay | Admitting: Internal Medicine

## 2012-08-14 ENCOUNTER — Ambulatory Visit (INDEPENDENT_AMBULATORY_CARE_PROVIDER_SITE_OTHER): Payer: Medicare Other | Admitting: Internal Medicine

## 2012-08-14 VITALS — BP 104/60 | HR 81 | Temp 98.9°F | Wt 162.0 lb

## 2012-08-14 DIAGNOSIS — I509 Heart failure, unspecified: Secondary | ICD-10-CM

## 2012-08-14 DIAGNOSIS — D509 Iron deficiency anemia, unspecified: Secondary | ICD-10-CM

## 2012-08-14 DIAGNOSIS — I5030 Unspecified diastolic (congestive) heart failure: Secondary | ICD-10-CM

## 2012-08-14 DIAGNOSIS — I2789 Other specified pulmonary heart diseases: Secondary | ICD-10-CM

## 2012-08-14 DIAGNOSIS — I272 Pulmonary hypertension, unspecified: Secondary | ICD-10-CM

## 2012-08-14 DIAGNOSIS — D696 Thrombocytopenia, unspecified: Secondary | ICD-10-CM

## 2012-08-14 DIAGNOSIS — E1165 Type 2 diabetes mellitus with hyperglycemia: Secondary | ICD-10-CM

## 2012-08-14 DIAGNOSIS — N390 Urinary tract infection, site not specified: Secondary | ICD-10-CM | POA: Insufficient documentation

## 2012-08-14 LAB — COMPREHENSIVE METABOLIC PANEL
ALT: 22 U/L (ref 0–53)
Albumin: 2.7 g/dL — ABNORMAL LOW (ref 3.5–5.2)
CO2: 25 mEq/L (ref 19–32)
Calcium: 8.1 mg/dL — ABNORMAL LOW (ref 8.4–10.5)
Chloride: 98 mEq/L (ref 96–112)
Creatinine, Ser: 1.2 mg/dL (ref 0.4–1.5)
GFR: 64.12 mL/min (ref >60.00)
Potassium: 4.2 mEq/L (ref 3.5–5.1)

## 2012-08-14 LAB — CBC WITH DIFFERENTIAL/PLATELET
Basophils Absolute: 0 10*3/uL (ref 0.0–0.1)
Eosinophils Absolute: 0 10*3/uL (ref 0.0–0.7)
Hemoglobin: 11.2 g/dL — ABNORMAL LOW (ref 13.0–17.0)
Lymphocytes Relative: 9.4 % — ABNORMAL LOW (ref 12.0–46.0)
Monocytes Relative: 9.8 % (ref 3.0–12.0)
Neutro Abs: 6.4 10*3/uL (ref 1.4–7.7)
Neutrophils Relative %: 80.1 % — ABNORMAL HIGH (ref 43.0–77.0)
Platelets: 146 10*3/uL — ABNORMAL LOW (ref 150.0–400.0)
RDW: 15.1 % — ABNORMAL HIGH (ref 11.5–14.6)

## 2012-08-14 LAB — FERRITIN: Ferritin: 118.9 ng/mL (ref 22.0–322.0)

## 2012-08-14 MED ORDER — TORSEMIDE 10 MG PO TABS: 10.0000 mg | ORAL_TABLET | Freq: Every day | ORAL | Status: DC

## 2012-08-14 NOTE — Assessment & Plan Note (Signed)
Recent ER visit for urinary tract infection. Currently asymptomatic. Will repeat urinalysis and culture today.

## 2012-08-14 NOTE — Assessment & Plan Note (Signed)
Given orthostatic hypotension and recent elevated BUN/creatinine, will reduce dose of torsemide further to 10 mg 5 days per week. Plan to recheck renal function with labs today. Followup for recheck of weight and blood pressure in one week.

## 2012-08-14 NOTE — Assessment & Plan Note (Signed)
Per pt record, blood sugars have been well controlled. Will continue to monitor. We'll plan to recheck A1c with labst in 10/2012.

## 2012-08-14 NOTE — Assessment & Plan Note (Signed)
Suspect mild dehydration based on recent labs and orthostatic hypotension. Will reduce dose of Torsemide to 10mg  5 days per week and recheck BUN/Cr today. Follow up 1 week to recheck weight and BP.

## 2012-08-14 NOTE — Progress Notes (Signed)
Subjective:    Patient ID: Matthew Miles, male    DOB: April 03, 1929, 79 y.o.   MRN: [id number]  HPI 79 year old male with history of pulmonary hypertension, diastolic heart failure, diabetes, and recent gallstone cholecystitis status post cholecystectomy presents for followup. He reports he is generally feeling well. He brings record of blood sugars which show most blood sugars near 100. He continues to follow a healthy diet low in carbohydrates. He did have one low blood sugar at 75.  In regards to blood pressure, he brings record of blood pressure showing most are near 100/60. He also brings a note from his physical therapist who is concerned about lightheadedness and hypotension. He notes that his cardiologist recently decreased his dose of torsemide to 20 mg 5 days per week. Notably, his weight is down over 7 pounds.  He also notes he was recently seen in the emergency room with a fever and found to have urinary tract infection. He was treated with Cipro and completes his course of Cipro today. He denies any recurrent symptoms of dysuria, hematuria, fever, chills, flank pain.  Outpatient Encounter Prescriptions as of 08/14/2012  Medication Sig Dispense Refill  . albuterol (PROVENTIL) (2.5 MG/3ML) 0.083% nebulizer solution Take 2.5 mg by nebulization every 6 (six) hours as needed.      Marland Kitchen albuterol (VENTOLIN HFA) 108 (90 BASE) MCG/ACT inhaler Inhale 2 puffs into the lungs every 4 (four) hours as needed.  18 g  3  . aspirin 81 MG tablet Take 81 mg by mouth daily.      . Blood Glucose Monitoring Suppl (ONE TOUCH ULTRA SYSTEM KIT) W/DEVICE KIT 1 kit by Does not apply route once.  1 each  0  . carvedilol (COREG) 3.125 MG tablet Take 3.125 mg by mouth 2 (two) times daily with a meal.       . glipiZIDE (GLUCOTROL XL) 5 MG 24 hr tablet Take 1 tablet (5 mg total) by mouth daily.  30 tablet  6  . glucose blood test strip One touch ultra test strips. Use to check blood sugar two times a day. Dx. 250.00  100  each  11  . KLOR-CON M20 20 MEQ tablet TAKE 1 TABLET BY MOUTH EVERY DAY  30 tablet  6  . NITROSTAT 0.4 MG SL tablet 500 each as needed.      . nystatin-triamcinolone (MYCOLOG II) cream       . omeprazole (PRILOSEC) 20 MG capsule Take 1 capsule (20 mg total) by mouth 2 (two) times daily. Twice daily before meals  180 capsule  4  . pravastatin (PRAVACHOL) 40 MG tablet Take 1 tablet (40 mg total) by mouth daily.  90 tablet  4  . torsemide (DEMADEX) 10 MG tablet Take 1 tablet (10 mg total) by mouth daily. 5 days a week per Dr. Mariah Milling  30 tablet  3   No facility-administered encounter medications on file as of 08/14/2012.   BP 104/60  Pulse 81  Temp(Src) 98.9 F (37.2 C) (Oral)  Wt 162 lb (73.483 kg)  BMI 29.62 kg/m2  SpO2 98%  Review of Systems  Constitutional: Negative for fever, chills, activity change, appetite change, fatigue and unexpected weight change.  Eyes: Negative for visual disturbance.  Respiratory: Negative for cough and shortness of breath.   Cardiovascular: Negative for chest pain, palpitations and leg swelling.  Gastrointestinal: Negative for abdominal pain and abdominal distention.  Genitourinary: Negative for dysuria, urgency and difficulty urinating.  Musculoskeletal: Negative for arthralgias and gait problem.  Skin: Negative for color change and rash.  Neurological: Positive for light-headedness.  Hematological: Negative for adenopathy.  Psychiatric/Behavioral: Negative for sleep disturbance and dysphoric mood. The patient is not nervous/anxious.        Objective:   Physical Exam  Constitutional: He is oriented to person, place, and time. He appears well-developed and well-nourished. No distress.  HENT:  Head: Normocephalic and atraumatic.  Right Ear: External ear normal.  Left Ear: External ear normal.  Nose: Nose normal.  Mouth/Throat: Oropharynx is clear and moist. No oropharyngeal exudate.  Eyes: Conjunctivae and EOM are normal. Pupils are equal, round,  and reactive to light. Right eye exhibits no discharge. Left eye exhibits no discharge. No scleral icterus.  Neck: Normal range of motion. Neck supple. No tracheal deviation present. No thyromegaly present.  Cardiovascular: Normal rate, regular rhythm and normal heart sounds.  Exam reveals no gallop and no friction rub.   No murmur heard. Pulmonary/Chest: Effort normal and breath sounds normal. No respiratory distress. He has no wheezes. He has no rales. He exhibits no tenderness.  Musculoskeletal: Normal range of motion. He exhibits no edema.  Lymphadenopathy:    He has no cervical adenopathy.  Neurological: He is alert and oriented to person, place, and time. No cranial nerve deficit. Coordination normal.  Skin: Skin is warm and dry. No rash noted. He is not diaphoretic. No erythema. No pallor.  Psychiatric: He has a normal mood and affect. His behavior is normal. Judgment and thought content normal.          Assessment & Plan:

## 2012-08-14 NOTE — Telephone Encounter (Signed)
For todays lab results please call Carie Caddy @ [phone]

## 2012-08-14 NOTE — Assessment & Plan Note (Signed)
Recent iron deficiency anemia. Will repeat CBC and ferritin with labs today.

## 2012-08-16 NOTE — Telephone Encounter (Signed)
Spoke with Britta Mccreedy about results she verbalized understanding and would like to go ahead with referral to Dr. Orlie Dakin.

## 2012-08-21 ENCOUNTER — Ambulatory Visit (INDEPENDENT_AMBULATORY_CARE_PROVIDER_SITE_OTHER): Payer: Medicare Other | Admitting: Internal Medicine

## 2012-08-21 ENCOUNTER — Encounter: Payer: Self-pay | Admitting: Internal Medicine

## 2012-08-21 VITALS — BP 100/40 | HR 75 | Temp 97.9°F | Wt 167.0 lb

## 2012-08-21 DIAGNOSIS — I10 Essential (primary) hypertension: Secondary | ICD-10-CM

## 2012-08-21 DIAGNOSIS — D696 Thrombocytopenia, unspecified: Secondary | ICD-10-CM

## 2012-08-21 DIAGNOSIS — IMO0001 Reserved for inherently not codable concepts without codable children: Secondary | ICD-10-CM

## 2012-08-21 DIAGNOSIS — D509 Iron deficiency anemia, unspecified: Secondary | ICD-10-CM

## 2012-08-21 DIAGNOSIS — E1165 Type 2 diabetes mellitus with hyperglycemia: Secondary | ICD-10-CM

## 2012-08-21 DIAGNOSIS — I509 Heart failure, unspecified: Secondary | ICD-10-CM

## 2012-08-21 DIAGNOSIS — I5030 Unspecified diastolic (congestive) heart failure: Secondary | ICD-10-CM

## 2012-08-21 NOTE — Assessment & Plan Note (Signed)
Anemia is persistent, however iron stores have improved with normal ferritin. Will set up hematology evaluation.

## 2012-08-21 NOTE — Assessment & Plan Note (Signed)
Reviewed recent labs showing thrombocytopenia and anemia today with pt and daughter. Note that pt brother has "bone cancer." Hematology referral in process for further evaluation.

## 2012-08-21 NOTE — Progress Notes (Signed)
Subjective:    Patient ID: Matthew Miles, male    DOB: 25-Dec-1928, 79 y.o.   MRN: [id number]  HPI 79YO male with h/o DM, HTN, pulmonary hypertension presents for follow up. Doing well. BG well controlled, typically <150 fasting. Appetite good. Still has some fatigue. Denies dyspnea or chest pain. Taking Demadex 5mg  daily. Weight up 5lb in 1 week.  Outpatient Encounter Prescriptions as of 08/21/2012  Medication Sig Dispense Refill  . albuterol (PROVENTIL) (2.5 MG/3ML) 0.083% nebulizer solution Take 2.5 mg by nebulization every 6 (six) hours as needed.      Marland Kitchen albuterol (VENTOLIN HFA) 108 (90 BASE) MCG/ACT inhaler Inhale 2 puffs into the lungs every 4 (four) hours as needed.  18 g  3  . aspirin 81 MG tablet Take 81 mg by mouth daily.      . Blood Glucose Monitoring Suppl (ONE TOUCH ULTRA SYSTEM KIT) W/DEVICE KIT 1 kit by Does not apply route once.  1 each  0  . carvedilol (COREG) 3.125 MG tablet Take 3.125 mg by mouth 2 (two) times daily with a meal.       . glipiZIDE (GLUCOTROL XL) 5 MG 24 hr tablet Take 1 tablet (5 mg total) by mouth daily.  30 tablet  6  . glucose blood test strip One touch ultra test strips. Use to check blood sugar two times a day. Dx. 250.00  100 each  11  . KLOR-CON M20 20 MEQ tablet TAKE 1 TABLET BY MOUTH EVERY DAY  30 tablet  6  . NITROSTAT 0.4 MG SL tablet 500 each as needed.      . nystatin-triamcinolone (MYCOLOG II) cream       . omeprazole (PRILOSEC) 20 MG capsule Take 1 capsule (20 mg total) by mouth 2 (two) times daily. Twice daily before meals  180 capsule  4  . pravastatin (PRAVACHOL) 40 MG tablet Take 1 tablet (40 mg total) by mouth daily.  90 tablet  4  . torsemide (DEMADEX) 10 MG tablet Take 10 mg by mouth daily. 1/2 tablet daily      . [DISCONTINUED] torsemide (DEMADEX) 10 MG tablet Take 1 tablet (10 mg total) by mouth daily. 5 days a week per Dr. Mariah Milling  30 tablet  3   No facility-administered encounter medications on file as of 08/21/2012.   BP 100/40   Pulse 75  Temp(Src) 97.9 F (36.6 C) (Oral)  Wt 167 lb (75.751 kg)  BMI 30.54 kg/m2  SpO2 98%  Review of Systems  Constitutional: Positive for fatigue. Negative for fever, chills, activity change, appetite change and unexpected weight change.  Eyes: Negative for visual disturbance.  Respiratory: Negative for cough and shortness of breath.   Cardiovascular: Negative for chest pain, palpitations and leg swelling.  Gastrointestinal: Negative for abdominal pain and abdominal distention.  Genitourinary: Negative for dysuria, urgency and difficulty urinating.  Musculoskeletal: Negative for arthralgias and gait problem.  Skin: Negative for color change and rash.  Hematological: Negative for adenopathy.  Psychiatric/Behavioral: Negative for sleep disturbance and dysphoric mood. The patient is not nervous/anxious.        Objective:   Physical Exam  Constitutional: He is oriented to person, place, and time. He appears well-developed and well-nourished. No distress.  HENT:  Head: Normocephalic and atraumatic.  Right Ear: External ear normal.  Left Ear: External ear normal.  Nose: Nose normal.  Mouth/Throat: Oropharynx is clear and moist. No oropharyngeal exudate.  Eyes: Conjunctivae and EOM are normal. Pupils are equal, round, and reactive  to light. Right eye exhibits no discharge. Left eye exhibits no discharge. No scleral icterus.  Neck: Normal range of motion. Neck supple. No tracheal deviation present. No thyromegaly present.  Cardiovascular: Normal rate, regular rhythm and normal heart sounds.  Exam reveals no gallop and no friction rub.   No murmur heard. Pulmonary/Chest: Effort normal and breath sounds normal. No respiratory distress. He has no wheezes. He has no rales. He exhibits no tenderness.  Musculoskeletal: Normal range of motion. He exhibits no edema.  Lymphadenopathy:    He has no cervical adenopathy.  Neurological: He is alert and oriented to person, place, and time. No  cranial nerve deficit. Coordination normal.  Skin: Skin is warm and dry. No rash noted. He is not diaphoretic. No erythema. No pallor.  Psychiatric: He has a normal mood and affect. His behavior is normal. Judgment and thought content normal.          Assessment & Plan:

## 2012-08-21 NOTE — Patient Instructions (Signed)
Take Demadex 10mg  daily today and tomorrow. Monitor weight after surgery on Thursday. If weight greater than 165lb, take 5mg  Demadex daily.  Follow up 4 weeks.

## 2012-08-21 NOTE — Assessment & Plan Note (Signed)
Pt is up 5lbs today. Will increase Demadex back to 10mg  daily x 2 days, then back to 5mg  daily. Pt will need to monitor for symptoms of hypotension such as lightheadedness. Follow up 2-4 weeks.

## 2012-08-21 NOTE — Assessment & Plan Note (Signed)
Blood sugars continue to be well controlled. Will continue current medications. Follow up for repeat A1c in 10/2012.

## 2012-08-23 ENCOUNTER — Ambulatory Visit: Payer: Self-pay | Admitting: Gastroenterology

## 2012-08-24 LAB — PATHOLOGY REPORT

## 2012-08-25 DIAGNOSIS — K859 Acute pancreatitis without necrosis or infection, unspecified: Secondary | ICD-10-CM

## 2012-08-25 DIAGNOSIS — K810 Acute cholecystitis: Secondary | ICD-10-CM

## 2012-08-27 ENCOUNTER — Ambulatory Visit: Payer: Self-pay | Admitting: Oncology

## 2012-08-27 LAB — CBC CANCER CENTER
Basophil #: 0 x10 3/mm (ref 0.0–0.1)
Basophil %: 0.5 %
Eosinophil #: 0 x10 3/mm (ref 0.0–0.7)
HCT: 37.3 % — ABNORMAL LOW (ref 40.0–52.0)
Lymphocyte #: 1.4 x10 3/mm (ref 1.0–3.6)
Lymphocyte %: 20.5 %
MCH: 28.1 pg (ref 26.0–34.0)
MCHC: 32.9 g/dL (ref 32.0–36.0)
MCV: 85 fL (ref 80–100)
Monocyte #: 0.9 x10 3/mm (ref 0.2–1.0)
Monocyte %: 13.1 %
Neutrophil #: 4.3 x10 3/mm (ref 1.4–6.5)
Platelet: 153 x10 3/mm (ref 150–440)
RBC: 4.37 10*6/uL — ABNORMAL LOW (ref 4.40–5.90)
RDW: 16.5 % — ABNORMAL HIGH (ref 11.5–14.5)
WBC: 6.7 x10 3/mm (ref 3.8–10.6)

## 2012-08-27 LAB — IRON AND TIBC
Iron Bind.Cap.(Total): 367 ug/dL (ref 250–450)
Iron Saturation: 18 %
Iron: 65 ug/dL (ref 65–175)
Unbound Iron-Bind.Cap.: 302 ug/dL

## 2012-08-27 LAB — FOLATE: Folic Acid: 18.5 ng/mL (ref 3.1–100.0)

## 2012-08-27 LAB — LACTATE DEHYDROGENASE: LDH: 157 U/L (ref 85–241)

## 2012-08-27 LAB — FERRITIN: Ferritin (ARMC): 47 ng/mL (ref 8–388)

## 2012-09-09 ENCOUNTER — Ambulatory Visit: Payer: Self-pay | Admitting: Oncology

## 2012-09-17 ENCOUNTER — Other Ambulatory Visit: Payer: Self-pay | Admitting: Internal Medicine

## 2012-09-17 NOTE — Telephone Encounter (Signed)
Eprescribed.

## 2012-09-21 ENCOUNTER — Encounter: Payer: Self-pay | Admitting: Internal Medicine

## 2012-09-21 ENCOUNTER — Ambulatory Visit (INDEPENDENT_AMBULATORY_CARE_PROVIDER_SITE_OTHER): Payer: Medicare Other | Admitting: Internal Medicine

## 2012-09-21 VITALS — BP 120/60 | HR 82 | Temp 98.2°F | Wt 174.0 lb

## 2012-09-21 DIAGNOSIS — D696 Thrombocytopenia, unspecified: Secondary | ICD-10-CM

## 2012-09-21 DIAGNOSIS — I10 Essential (primary) hypertension: Secondary | ICD-10-CM

## 2012-09-21 DIAGNOSIS — E1165 Type 2 diabetes mellitus with hyperglycemia: Secondary | ICD-10-CM

## 2012-09-21 NOTE — Assessment & Plan Note (Signed)
Encouraged better effort at low carbohydrate diet an effort to control blood sugar. Patient will followup with endocrinology in July for repeat A1c. Continue current medication. Followup here in 6 months.  Over of which >50% spent in face-to-face contact with patient and daughter discussing plan of care

## 2012-09-21 NOTE — Assessment & Plan Note (Signed)
Patient reports recent platelet count was improved when checked by hematologist. Will request notes from this visit.

## 2012-09-21 NOTE — Assessment & Plan Note (Signed)
BP Readings from Last 3 Encounters:  09/21/12 120/60  08/21/12 100/40  08/14/12 104/60   BP well controlled on current medication. Will continue.

## 2012-09-21 NOTE — Progress Notes (Signed)
Subjective:    Patient ID: Matthew Miles, male    DOB: [birth year], 79 y.o.   MRN: [id number]  HPI 79 year old male with history of diabetes, hypertension, pulmonary hypertension, COPD presents for followup. In regards to diabetes, he notes some dietary indiscretion. His blood sugars have been more elevated, typically 160-180 fasting. He is compliant with medication. He was seen by endocrinology, per his request, and they recommended keeping medications as they are. He has followup with endocrinology next month.  Aside from this, he reports he is feeling well. No new concerns today.  Patient was also recently seen by hematologist for thrombocytopenia. He reports that all testing was normal. He reports that platelet count had improved to normal range. He denies any recent fatigue, sweats, chills, or easy bleeding or bruising.  Outpatient Encounter Prescriptions as of 09/21/2012  Medication Sig Dispense Refill  . albuterol (PROVENTIL) (2.5 MG/3ML) 0.083% nebulizer solution Take 2.5 mg by nebulization every 6 (six) hours as needed.      Marland Kitchen albuterol (VENTOLIN HFA) 108 (90 BASE) MCG/ACT inhaler Inhale 2 puffs into the lungs every 4 (four) hours as needed.  18 g  3  . aspirin 81 MG tablet Take 81 mg by mouth daily.      . Blood Glucose Monitoring Suppl (ONE TOUCH ULTRA SYSTEM KIT) W/DEVICE KIT 1 kit by Does not apply route once.  1 each  0  . carvedilol (COREG) 3.125 MG tablet TAKE 1 TABLET BY MOUTH 2 TIMES A DAY  60 tablet  3  . glipiZIDE (GLUCOTROL XL) 5 MG 24 hr tablet Take 1 tablet (5 mg total) by mouth daily.  30 tablet  6  . glucose blood test strip One touch ultra test strips. Use to check blood sugar two times a day. Dx. 250.00  100 each  11  . KLOR-CON M20 20 MEQ tablet TAKE 1 TABLET BY MOUTH EVERY DAY  30 tablet  6  . NITROSTAT 0.4 MG SL tablet 500 each as needed.      . nystatin-triamcinolone (MYCOLOG II) cream       . omeprazole (PRILOSEC) 20 MG capsule Take 1 capsule (20 mg total) by  mouth 2 (two) times daily. Twice daily before meals  180 capsule  4  . pravastatin (PRAVACHOL) 40 MG tablet Take 1 tablet (40 mg total) by mouth daily.  90 tablet  4  . torsemide (DEMADEX) 10 MG tablet Take 10 mg by mouth daily. 1/2 tablet daily       No facility-administered encounter medications on file as of 09/21/2012.   BP 120/60  Pulse 82  Temp(Src) 98.2 F (36.8 C) (Oral)  Wt 174 lb (78.926 kg)  BMI 31.82 kg/m2  SpO2 95%  Review of Systems  Constitutional: Negative for fever, chills, activity change, appetite change, fatigue and unexpected weight change.  Eyes: Negative for visual disturbance.  Respiratory: Negative for cough and shortness of breath.   Cardiovascular: Negative for chest pain, palpitations and leg swelling.  Gastrointestinal: Negative for abdominal pain and abdominal distention.  Genitourinary: Negative for dysuria, urgency and difficulty urinating.  Musculoskeletal: Negative for arthralgias and gait problem.  Skin: Negative for color change and rash.  Hematological: Negative for adenopathy.  Psychiatric/Behavioral: Negative for sleep disturbance and dysphoric mood. The patient is not nervous/anxious.        Objective:   Physical Exam  Constitutional: He is oriented to person, place, and time. He appears well-developed and well-nourished. No distress.  HENT:  Head: Normocephalic and atraumatic.  Right Ear: External ear normal.  Left Ear: External ear normal.  Nose: Nose normal.  Mouth/Throat: Oropharynx is clear and moist. No oropharyngeal exudate.  Eyes: Conjunctivae and EOM are normal. Pupils are equal, round, and reactive to light. Right eye exhibits no discharge. Left eye exhibits no discharge. No scleral icterus.  Neck: Normal range of motion. Neck supple. No tracheal deviation present. No thyromegaly present.  Cardiovascular: Normal rate, regular rhythm and normal heart sounds.  Exam reveals no gallop and no friction rub.   No murmur  heard. Pulmonary/Chest: Effort normal and breath sounds normal. No respiratory distress. He has no wheezes. He has no rales. He exhibits no tenderness.  Musculoskeletal: Normal range of motion. He exhibits no edema.  Lymphadenopathy:    He has no cervical adenopathy.  Neurological: He is alert and oriented to person, place, and time. No cranial nerve deficit. Coordination normal.  Skin: Skin is warm and dry. No rash noted. He is not diaphoretic. No erythema. No pallor.  Psychiatric: He has a normal mood and affect. His behavior is normal. Judgment and thought content normal.          Assessment & Plan:

## 2012-09-24 DIAGNOSIS — N139 Obstructive and reflux uropathy, unspecified: Secondary | ICD-10-CM | POA: Insufficient documentation

## 2012-10-02 ENCOUNTER — Other Ambulatory Visit: Payer: Medicare Other

## 2012-10-05 ENCOUNTER — Ambulatory Visit: Payer: Medicare Other | Admitting: Internal Medicine

## 2012-10-09 ENCOUNTER — Encounter: Payer: Self-pay | Admitting: Cardiovascular Disease

## 2012-10-09 ENCOUNTER — Ambulatory Visit (INDEPENDENT_AMBULATORY_CARE_PROVIDER_SITE_OTHER): Payer: Medicare Other | Admitting: Cardiovascular Disease

## 2012-10-09 ENCOUNTER — Ambulatory Visit: Payer: Self-pay | Admitting: Oncology

## 2012-10-09 VITALS — BP 130/58 | HR 77 | Ht 62.0 in | Wt 178.0 lb

## 2012-10-09 DIAGNOSIS — I272 Pulmonary hypertension, unspecified: Secondary | ICD-10-CM

## 2012-10-09 DIAGNOSIS — R609 Edema, unspecified: Secondary | ICD-10-CM

## 2012-10-09 DIAGNOSIS — I509 Heart failure, unspecified: Secondary | ICD-10-CM

## 2012-10-09 DIAGNOSIS — I2789 Other specified pulmonary heart diseases: Secondary | ICD-10-CM

## 2012-10-09 DIAGNOSIS — I2510 Atherosclerotic heart disease of native coronary artery without angina pectoris: Secondary | ICD-10-CM

## 2012-10-09 DIAGNOSIS — R600 Localized edema: Secondary | ICD-10-CM

## 2012-10-09 DIAGNOSIS — E1165 Type 2 diabetes mellitus with hyperglycemia: Secondary | ICD-10-CM

## 2012-10-09 DIAGNOSIS — IMO0002 Reserved for concepts with insufficient information to code with codable children: Secondary | ICD-10-CM

## 2012-10-09 DIAGNOSIS — I5032 Chronic diastolic (congestive) heart failure: Secondary | ICD-10-CM

## 2012-10-09 MED ORDER — TORSEMIDE 10 MG PO TABS
10.0000 mg | ORAL_TABLET | Freq: Two times a day (BID) | ORAL | Status: DC | PRN
Start: 2012-10-09 — End: 2014-04-21

## 2012-10-09 NOTE — Progress Notes (Signed)
Patient ID: Matthew Miles, male    DOB: 1928-09-02, 79 y.o.   MRN: [id number]  HPI Comments: Matthew Miles is a 79 yo-old gentleman with history of coronary artery disease, moderate pulmonary hypertension in early 2013, previous MI with occluded LAD, long history of smoking for 60 years, history of bronchitis requiring several courses of antibiotics at the end of 2012, prednisone and nebulizers with admission to the hospital in December for hematemesis and gastric ulcer seen on EGD, found to have COPD and oxygenation down to 83% with ambulation, nonsustained VT per the notes, acute renal failure who presents for followup.  Recent left and right heart cath done in February 2013 showing pulmonary hypertension, stable severe coronary artery disease as below. Normal ejection fraction  Right heart catheterization showed significant fluid overload with wedge pressure of 24, moderate pulmonary hypertension. Catheterization showed occluded LAD which was old, no other significant stenoses requiring intervention.  In followup today, his weight has climbed from the 166 range up to 178 pounds. He denies any significant shortness of breath. He has minimal edema. He reports that the reason his weight has climbed so significantly is the disease eating poorly, and the wrong foods. His sugars have not been well controlled.  Otherwise he has no complaints. He is taking torsemide 10 mg daily. Previously he was taking this twice a day.  EKG shows normal sinus rhythm with rate769 beats per minute with APCs, left anterior fascicular block, old anterior infarct       Outpatient Encounter Prescriptions as of 10/09/2012  Medication Sig Dispense Refill  . albuterol (PROVENTIL) (2.5 MG/3ML) 0.083% nebulizer solution Take 2.5 mg by nebulization every 6 (six) hours as needed.      Marland Kitchen albuterol (VENTOLIN HFA) 108 (90 BASE) MCG/ACT inhaler Inhale 2 puffs into the lungs every 4 (four) hours as needed.  18 g  3  . aspirin 81 MG  tablet Take 81 mg by mouth daily.      . Blood Glucose Monitoring Suppl (ONE TOUCH ULTRA SYSTEM KIT) W/DEVICE KIT 1 kit by Does not apply route once.  1 each  0  . carvedilol (COREG) 3.125 MG tablet TAKE 1 TABLET BY MOUTH 2 TIMES A DAY  60 tablet  3  . glipiZIDE (GLUCOTROL XL) 5 MG 24 hr tablet Take 1 tablet (5 mg total) by mouth daily.  30 tablet  6  . glucose blood test strip One touch ultra test strips. Use to check blood sugar two times a day. Dx. 250.00  100 each  11  . KLOR-CON M20 20 MEQ tablet TAKE 1 TABLET BY MOUTH EVERY DAY  30 tablet  6  . NITROSTAT 0.4 MG SL tablet 500 each as needed.      . nystatin-triamcinolone (MYCOLOG II) cream       . omeprazole (PRILOSEC) 20 MG capsule Take 1 capsule (20 mg total) by mouth 2 (two) times daily. Twice daily before meals  180 capsule  4  . pravastatin (PRAVACHOL) 40 MG tablet Take 1 tablet (40 mg total) by mouth daily.  90 tablet  4  . torsemide (DEMADEX) 10 MG tablet Take 10 mg by mouth daily.        No facility-administered encounter medications on file as of 10/09/2012.     Review of Systems  HENT: Negative.   Eyes: Negative.   Cardiovascular: Positive for leg swelling.  Gastrointestinal: Negative.   Musculoskeletal: Negative.   Skin: Negative.   Neurological: Negative.   Psychiatric/Behavioral: Negative.  All other systems reviewed and are negative.   BP 130/58  Pulse 77  Ht 5\' 2"  (1.575 m)  Wt 178 lb (80.74 kg)  BMI 32.55 kg/m2  Physical Exam  Nursing note and vitals reviewed. Constitutional: He is oriented to person, place, and time. He appears well-developed and well-nourished.  HENT:  Head: Normocephalic.  Nose: Nose normal.  Mouth/Throat: Oropharynx is clear and moist.  Eyes: Conjunctivae are normal. Pupils are equal, round, and reactive to light.  Neck: Normal range of motion. Neck supple. No JVD present.  Cardiovascular: Normal rate, regular rhythm, S1 normal, S2 normal and intact distal pulses.  Frequent  extrasystoles are present. Exam reveals no gallop and no friction rub.   Murmur heard.  Systolic murmur is present with a grade of 2/6  Trace edema around the ankles  Pulmonary/Chest: Effort normal and breath sounds normal. No respiratory distress. He has no wheezes. He has no rales. He exhibits no tenderness.  Abdominal: Soft. Bowel sounds are normal. He exhibits no distension. There is no tenderness.  Musculoskeletal: Normal range of motion. He exhibits no edema and no tenderness.  Lymphadenopathy:    He has no cervical adenopathy.  Neurological: He is alert and oriented to person, place, and time. Coordination normal.  Skin: Skin is warm and dry. No rash noted. No erythema.  Psychiatric: He has a normal mood and affect. His behavior is normal. Judgment and thought content normal.      Assessment and Plan

## 2012-10-09 NOTE — Assessment & Plan Note (Signed)
Trace pitting edema on today's exam in the lower shins

## 2012-10-09 NOTE — Assessment & Plan Note (Signed)
I'm concerned about recent weight gain of more than 10 pounds. Trace to mild edema on today's exam. I suggested Matthew Miles take torsemide 10 mg twice a day for weight in the high 170 range. Certainly 4/180 pounds at home, Matthew Miles should take torsemide twice a day.

## 2012-10-09 NOTE — Assessment & Plan Note (Addendum)
Moderate pulmonary hypertension seen last year. We'll need to watch his weight climbed closely. He denies weight gain is from fluids and more from eating poorly.

## 2012-10-09 NOTE — Patient Instructions (Addendum)
Take extra torsemide, twice a day, for worsening leg selling, weight gain Take torsemide twice a day for weight in the high 170s to 180 pounds  Please call us if you have new issues that need to be addressed before your next appt.  Your physician wants you to follow-up in: 6 months.  You will receive a reminder letter in the mail two months in advance. If you don't receive a letter, please call our office to schedule the follow-up appointment.

## 2012-10-09 NOTE — Assessment & Plan Note (Signed)
Recent weight gain. He reports sugars have been poorly controlled. We have suggested he be more strict with his diet.

## 2012-10-09 NOTE — Assessment & Plan Note (Signed)
Currently with no symptoms of angina. No further workup at this time. Continue current medication regimen. 

## 2012-10-11 ENCOUNTER — Other Ambulatory Visit: Payer: Medicare Other

## 2012-10-15 ENCOUNTER — Other Ambulatory Visit: Payer: Self-pay | Admitting: Internal Medicine

## 2012-10-15 NOTE — Telephone Encounter (Signed)
Refilled 10/09/12, confirmed by pharmacy.

## 2012-10-17 ENCOUNTER — Ambulatory Visit: Payer: Medicare Other | Admitting: Internal Medicine

## 2012-10-22 ENCOUNTER — Other Ambulatory Visit: Payer: Self-pay | Admitting: Internal Medicine

## 2012-11-06 LAB — HM DIABETES EYE EXAM

## 2012-11-07 ENCOUNTER — Other Ambulatory Visit: Payer: Self-pay | Admitting: Internal Medicine

## 2012-12-07 ENCOUNTER — Other Ambulatory Visit: Payer: Self-pay | Admitting: Internal Medicine

## 2012-12-24 ENCOUNTER — Other Ambulatory Visit: Payer: Self-pay | Admitting: Internal Medicine

## 2012-12-31 LAB — HM DIABETES FOOT EXAM

## 2013-01-10 ENCOUNTER — Ambulatory Visit (INDEPENDENT_AMBULATORY_CARE_PROVIDER_SITE_OTHER): Payer: Medicare Other

## 2013-01-10 DIAGNOSIS — Z23 Encounter for immunization: Secondary | ICD-10-CM

## 2013-01-11 ENCOUNTER — Other Ambulatory Visit: Payer: Self-pay | Admitting: Internal Medicine

## 2013-01-31 ENCOUNTER — Encounter: Payer: Self-pay | Admitting: Podiatrist

## 2013-01-31 ENCOUNTER — Ambulatory Visit (INDEPENDENT_AMBULATORY_CARE_PROVIDER_SITE_OTHER): Payer: Medicare Other | Admitting: Podiatrist

## 2013-01-31 VITALS — BP 95/41 | HR 93 | Resp 20 | Ht 63.0 in | Wt 185.0 lb

## 2013-01-31 DIAGNOSIS — E1149 Type 2 diabetes mellitus with other diabetic neurological complication: Secondary | ICD-10-CM

## 2013-01-31 NOTE — Patient Instructions (Signed)

## 2013-02-06 NOTE — Progress Notes (Signed)
Subjective:  Patient presents today for a check of his diabetic shoes and a general diabetic foot check.  Patient relates no new problems with his feet and states his diabetic shoes have been comfortable.   Objective:  Neurovascular status unchanged with palpable pedal pulses and capillary refill time within normal limits.  Neurological sensation continues to be decreased via SWMF at 5/10 sites bilateral.  Light touch is decreased.  Pes planus deformity also present and unchanged.    Assessment:  Diabetes with neuropathy  Plan:  Evaluated his diabetic shoes and wear pattern-  He appears to be doing well with the shoes as they fit and contour nicely and have an even wear pattern noted.  Patient to return for routine care at his request or in 3 months.

## 2013-02-11 ENCOUNTER — Other Ambulatory Visit: Payer: Self-pay | Admitting: *Deleted

## 2013-02-11 MED ORDER — POTASSIUM CHLORIDE CRYS ER 20 MEQ PO TBCR: EXTENDED_RELEASE_TABLET | ORAL | Status: DC

## 2013-02-11 NOTE — Telephone Encounter (Signed)
Requested Prescriptions   Signed Prescriptions Disp Refills  . potassium chloride SA (KLOR-CON M20) 20 MEQ tablet 30 tablet 6    Sig: TAKE 1 TABLET BY MOUTH EVERY DAY    Authorizing Provider: Antonieta Iba    Ordering User: Kendrick Fries

## 2013-03-04 ENCOUNTER — Telehealth: Payer: Self-pay | Admitting: Internal Medicine

## 2013-03-04 NOTE — Telephone Encounter (Signed)
Spoke with pt, states cough began on Saturday, has been taking Robitussin without much relief. States mainly non productive cough, but occasionally clear sputum. Denies fever or shortness of breath. States occasional wheezing. Advised he would need to be seen for treatment. First appointments are tomorrow afternoon with Raquel...is that ok with you or do you want him to be seen sooner?

## 2013-03-04 NOTE — Telephone Encounter (Signed)
Pt has a harsh cough.  Daughter asking if he can be seen or have something called in.  Please contact daughter.

## 2013-03-04 NOTE — Telephone Encounter (Signed)
Appt scheduled with Raquel tomorrow

## 2013-03-04 NOTE — Telephone Encounter (Signed)
I agree that he should be seen. He can see Matthew Miles tomorrow.

## 2013-03-05 ENCOUNTER — Ambulatory Visit (INDEPENDENT_AMBULATORY_CARE_PROVIDER_SITE_OTHER): Payer: Medicare Other | Admitting: Adult Health

## 2013-03-05 ENCOUNTER — Encounter: Payer: Self-pay | Admitting: Adult Health

## 2013-03-05 VITALS — BP 118/78 | HR 78 | Temp 98.3°F | Resp 14 | Wt 189.0 lb

## 2013-03-05 DIAGNOSIS — R059 Cough, unspecified: Secondary | ICD-10-CM | POA: Insufficient documentation

## 2013-03-05 DIAGNOSIS — R05 Cough: Secondary | ICD-10-CM | POA: Insufficient documentation

## 2013-03-05 MED ORDER — AZITHROMYCIN 250 MG PO TABS: ORAL_TABLET | ORAL | Status: DC

## 2013-03-05 MED ORDER — GUAIFENESIN-CODEINE 100-10 MG/5ML PO SOLN: 5.0000 mL | Freq: Three times a day (TID) | ORAL | Status: DC | PRN

## 2013-03-05 NOTE — Patient Instructions (Signed)
  Start Azithromycin today - take 2 tablets today and then take 1 tablet daily for the next 4 days.  Take Robitussin AC - 1 tsp three times a day as needed for cough. This medication has codeine which will make you sleepy. Please do not drive and use caution not to fall.

## 2013-03-05 NOTE — Progress Notes (Signed)
  Subjective:    Patient ID: Matthew Miles, male    DOB: 07-08-1928, 79 y.o.   MRN: [id number]  HPI  Pt is a pleasant 79 yo male, presents to clinic with nonproductive cough since Saturday. Has been taking Robitussin without relief. He denies fever or chills, denies SOB or chest pain. States cough is worse at night. He reports feeling really bad on Sunday. Has improved somewhat.  Current Outpatient Prescriptions on File Prior to Visit  Medication Sig Dispense Refill  . albuterol (PROVENTIL) (2.5 MG/3ML) 0.083% nebulizer solution Take 2.5 mg by nebulization every 6 (six) hours as needed.      Marland Kitchen albuterol (VENTOLIN HFA) 108 (90 BASE) MCG/ACT inhaler Inhale 2 puffs into the lungs every 4 (four) hours as needed.  18 g  3  . aspirin 81 MG tablet Take 81 mg by mouth daily.      . Blood Glucose Monitoring Suppl (ONE TOUCH ULTRA SYSTEM KIT) W/DEVICE KIT 1 kit by Does not apply route once.  1 each  0  . carvedilol (COREG) 3.125 MG tablet TAKE 1 TABLET BY MOUTH 2 TIMES A DAY  60 tablet  5  . Cyanocobalamin (B-12) 100 MCG TABS Take by mouth daily.      Marland Kitchen glipiZIDE (GLUCOTROL XL) 5 MG 24 hr tablet TAKE 1 TABLET (5 MG TOTAL) BY MOUTH DAILY.  30 tablet  6  . glucose blood test strip One touch ultra test strips. Use to check blood sugar two times a day. Dx. 250.00  100 each  11  . NITROSTAT 0.4 MG SL tablet 500 each as needed.      . nystatin-triamcinolone (MYCOLOG II) cream       . omeprazole (PRILOSEC) 20 MG capsule Take 1 capsule (20 mg total) by mouth 2 (two) times daily. Twice daily before meals  180 capsule  4  . potassium chloride SA (KLOR-CON M20) 20 MEQ tablet TAKE 1 TABLET BY MOUTH EVERY DAY  30 tablet  6  . pravastatin (PRAVACHOL) 40 MG tablet Take 1 tablet (40 mg total) by mouth daily.  90 tablet  4  . torsemide (DEMADEX) 10 MG tablet Take 1 tablet (10 mg total) by mouth 2 (two) times daily as needed.  60 tablet  6  . torsemide (DEMADEX) 20 MG tablet TAKE 1 TABLET (20 MG TOTAL) BY MOUTH 2 (TWO)  TIMES DAILY.  60 tablet  3   No current facility-administered medications on file prior to visit.     Review of Systems  Constitutional: Negative for fever and chills.  Respiratory: Positive for cough. Negative for shortness of breath.   Cardiovascular: Negative for chest pain.       Objective:   Physical Exam  Constitutional: No distress.  HENT:  Mouth/Throat: Oropharynx is clear and moist.  Pulmonary/Chest: Effort normal. No respiratory distress. He has rhonchi.  Rhonchi improves with coughing.    BP 118/78  Pulse 78  Temp(Src) 98.3 F (36.8 C) (Oral)  Wt 189 lb (85.73 kg)  SpO2 96%      Assessment & Plan:

## 2013-03-05 NOTE — Progress Notes (Signed)
Pre visit review using our clinic review tool, if applicable. No additional management support is needed unless otherwise documented below in the visit note. 

## 2013-03-05 NOTE — Assessment & Plan Note (Signed)
79 y/o gentleman with significant cough x 1 week. Worse at night. Rhonchi. Clears with cough. Start Azithromycin and robitussin ac. RTC if no improvement within 3-4 day.

## 2013-03-27 ENCOUNTER — Encounter: Payer: Medicare Other | Admitting: Internal Medicine

## 2013-04-01 ENCOUNTER — Encounter: Payer: Self-pay | Admitting: Internal Medicine

## 2013-04-01 ENCOUNTER — Ambulatory Visit (INDEPENDENT_AMBULATORY_CARE_PROVIDER_SITE_OTHER): Payer: Medicare Other | Admitting: Internal Medicine

## 2013-04-01 VITALS — BP 108/60 | HR 92 | Temp 98.3°F | Ht 63.0 in | Wt 193.0 lb

## 2013-04-01 DIAGNOSIS — E1165 Type 2 diabetes mellitus with hyperglycemia: Secondary | ICD-10-CM

## 2013-04-01 DIAGNOSIS — Z0000 Encounter for general adult medical examination without abnormal findings: Secondary | ICD-10-CM

## 2013-04-01 MED ORDER — GLIPIZIDE ER 5 MG PO TB24: ORAL_TABLET | ORAL | Status: DC

## 2013-04-01 MED ORDER — CARVEDILOL 3.125 MG PO TABS: ORAL_TABLET | ORAL | Status: DC

## 2013-04-01 MED ORDER — POTASSIUM CHLORIDE CRYS ER 20 MEQ PO TBCR: EXTENDED_RELEASE_TABLET | ORAL | Status: DC

## 2013-04-01 MED ORDER — NITROGLYCERIN 0.4 MG SL SUBL: 0.4000 mg | SUBLINGUAL_TABLET | SUBLINGUAL | Status: DC | PRN

## 2013-04-01 NOTE — Assessment & Plan Note (Signed)
General medical exam normal today. Encouraged healthy diet and regular physical activity. Health maintenance is up to date except for labs. Will ask his endocrinologist drop CMP, lipid profile, PSA with his A1c tomorrow. Immunizations are up-to-date. Appropriate screening performed.

## 2013-04-01 NOTE — Assessment & Plan Note (Signed)
Patient reports recent elevated blood sugars. Will request A1c from his endocrinologist. He is also confused about his medications. Asked him to bring his medication bottles with him tomorrow.

## 2013-04-01 NOTE — Progress Notes (Signed)
Pre-visit discussion using our clinic review tool. No additional management support is needed unless otherwise documented below in the visit note.  

## 2013-04-01 NOTE — Progress Notes (Signed)
Subjective:    Patient ID: Matthew Miles, male    DOB: Nov 07, 1928, 79 y.o.   MRN: [id number]  HPI The patient is here for annual Medicare wellness examination and management of other chronic and acute problems.   The risk factors are reflected in the social history.  The roster of all physicians providing medical care to patient - is listed in the Snapshot section of the chart.  Activities of daily living:  The patient is 100% independent in all ADLs: dressing, toileting, feeding as well as independent mobility. Lives alone.  Home safety : The patient has smoke detectors in the home. They wear seatbelts.  There are no firearms at home. There is no violence in the home.   There is no risks for hepatitis, STDs or HIV. There is no   history of blood transfusion. They have no travel history to infectious disease endemic areas of the world.  The patient has not seen their dentist in the last six month. Wears dentures.  They have seen their eye doctor in the last year. Opthalmology - in [place]  They have had audiologic testing in the last year. [facility] - scheduled to get hearing aids, right worse than left  They do not  have excessive sun exposure. Discussed the need for sun protection: hats, long sleeves and use of sunscreen if there is significant sun exposure. Dermatologist - Dr. Gwen Pounds Cardiology - Dr. Mariah Milling  Diet: the importance of a healthy diet is discussed. They do have a relatively healthy diet.  The benefits of regular aerobic exercise were discussed.  Occasional exercise.  Depression screen: there are no signs or vegative symptoms of depression- irritability, change in appetite, anhedonia, sadness/tearfullness.  Cognitive assessment: the patient manages all their financial and personal affairs and is actively engaged. They could relate day,date,year and events.  The following portions of the patient's history were reviewed and updated as appropriate: allergies, current  medications, past family history, past medical history,  past surgical history, past social history  and problem list.  Visual acuity was not assessed per patient preference since has regular follow up with ophthalmologist. Hearing and body mass index were assessed and reviewed.   During the course of the visit the patient was educated and counseled about appropriate screening and preventive services including : fall prevention , diabetes screening, nutrition counseling, colorectal cancer screening, and recommended immunizations.    Blood sugars have been running "high" per pt. Compliant with meds. Followed by endocrinology. Scheduled for repeat A1c tomorrow.   BP 108/60  Pulse 92  Temp(Src) 98.3 F (36.8 C) (Oral)  Ht 5\' 3"  (1.6 m)  Wt 193 lb (87.544 kg)  BMI 34.20 kg/m2  SpO2 98%   Outpatient Encounter Prescriptions as of 04/01/2013  Medication Sig  . aspirin 81 MG tablet Take 81 mg by mouth daily.  . Blood Glucose Monitoring Suppl (ONE TOUCH ULTRA SYSTEM KIT) W/DEVICE KIT 1 kit by Does not apply route once.  . carvedilol (COREG) 3.125 MG tablet TAKE 1 TABLET BY MOUTH 2 TIMES A DAY  . Cyanocobalamin (B-12) 100 MCG TABS Take by mouth daily.  Marland Kitchen glipiZIDE (GLUCOTROL XL) 5 MG 24 hr tablet TAKE 1 TABLET (5 MG TOTAL) BY MOUTH DAILY.  Marland Kitchen glucose blood test strip One touch ultra test strips. Use to check blood sugar two times a day. Dx. 250.00  . NITROSTAT 0.4 MG SL tablet 500 each as needed.  Marland Kitchen omeprazole (PRILOSEC) 20 MG capsule Take 1 capsule (20 mg  total) by mouth 2 (two) times daily. Twice daily before meals  . potassium chloride SA (KLOR-CON M20) 20 MEQ tablet TAKE 1 TABLET BY MOUTH EVERY DAY  . pravastatin (PRAVACHOL) 40 MG tablet Take 1 tablet (40 mg total) by mouth daily.  Marland Kitchen torsemide (DEMADEX) 10 MG tablet Take 1 tablet (10 mg total) by mouth 2 (two) times daily as needed.  . torsemide (DEMADEX) 20 MG tablet TAKE 1 TABLET (20 MG TOTAL) BY MOUTH 2 (TWO) TIMES DAILY.  Marland Kitchen  nystatin-triamcinolone (MYCOLOG II) cream     Review of Systems  Constitutional: Negative for fever, chills, activity change, appetite change, fatigue and unexpected weight change.  Eyes: Negative for visual disturbance.  Respiratory: Negative for cough and shortness of breath.   Cardiovascular: Negative for chest pain, palpitations and leg swelling.  Gastrointestinal: Negative for abdominal pain and abdominal distention.  Genitourinary: Negative for dysuria, urgency and difficulty urinating.  Musculoskeletal: Negative for arthralgias and gait problem.  Skin: Negative for color change and rash.  Hematological: Negative for adenopathy.  Psychiatric/Behavioral: Negative for sleep disturbance and dysphoric mood. The patient is not nervous/anxious.        Objective:   Physical Exam  Constitutional: He is oriented to person, place, and time. He appears well-developed and well-nourished. No distress.  HENT:  Head: Normocephalic and atraumatic.  Right Ear: External ear normal.  Left Ear: External ear normal.  Nose: Nose normal.  Mouth/Throat: Oropharynx is clear and moist. No oropharyngeal exudate.  Eyes: Conjunctivae and EOM are normal. Pupils are equal, round, and reactive to light. Right eye exhibits no discharge. Left eye exhibits no discharge. No scleral icterus.  Neck: Normal range of motion. Neck supple. No tracheal deviation present. No thyromegaly present.  Cardiovascular: Normal rate, regular rhythm and normal heart sounds.  Exam reveals no gallop and no friction rub.   No murmur heard. Pulmonary/Chest: Effort normal and breath sounds normal. No respiratory distress. He has no wheezes. He has no rales. He exhibits no tenderness.  Abdominal: Soft. Bowel sounds are normal. He exhibits no distension and no mass. There is no tenderness. There is no rebound and no guarding.  Musculoskeletal: Normal range of motion. He exhibits no edema.  Lymphadenopathy:    He has no cervical  adenopathy.  Neurological: He is alert and oriented to person, place, and time. No cranial nerve deficit. Coordination normal.  Skin: Skin is warm and dry. No rash noted. He is not diaphoretic. No erythema. No pallor.  Psychiatric: He has a normal mood and affect. His behavior is normal. Judgment and thought content normal.          Assessment & Plan:

## 2013-04-02 ENCOUNTER — Other Ambulatory Visit: Payer: Self-pay | Admitting: Internal Medicine

## 2013-04-02 MED ORDER — GLIPIZIDE ER 5 MG PO TB24: ORAL_TABLET | ORAL | Status: DC

## 2013-04-02 NOTE — Telephone Encounter (Signed)
Ok to send in with those directions?

## 2013-04-02 NOTE — Telephone Encounter (Signed)
Matthew Miles brought his med that dr Tedd Sias prescribed for him Glipizide er 5mg  tablet Take 2 tablets by mouth every morning and 1 tablet at supper [facility]  Pt needs refill

## 2013-04-03 NOTE — Telephone Encounter (Signed)
Rx sent by Dr. Dan Humphreys

## 2013-04-09 ENCOUNTER — Ambulatory Visit: Payer: Medicare Other | Admitting: Cardiovascular Disease

## 2013-04-15 ENCOUNTER — Encounter (INDEPENDENT_AMBULATORY_CARE_PROVIDER_SITE_OTHER): Payer: Self-pay

## 2013-04-15 ENCOUNTER — Encounter: Payer: Self-pay | Admitting: Cardiovascular Disease

## 2013-04-15 ENCOUNTER — Ambulatory Visit (INDEPENDENT_AMBULATORY_CARE_PROVIDER_SITE_OTHER): Payer: Medicare Other | Admitting: Cardiovascular Disease

## 2013-04-15 VITALS — BP 147/76 | HR 81 | Ht 62.0 in | Wt 193.5 lb

## 2013-04-15 DIAGNOSIS — IMO0002 Reserved for concepts with insufficient information to code with codable children: Secondary | ICD-10-CM

## 2013-04-15 DIAGNOSIS — I5033 Acute on chronic diastolic (congestive) heart failure: Secondary | ICD-10-CM | POA: Insufficient documentation

## 2013-04-15 DIAGNOSIS — I2789 Other specified pulmonary heart diseases: Secondary | ICD-10-CM

## 2013-04-15 DIAGNOSIS — IMO0001 Reserved for inherently not codable concepts without codable children: Secondary | ICD-10-CM

## 2013-04-15 DIAGNOSIS — I5032 Chronic diastolic (congestive) heart failure: Secondary | ICD-10-CM

## 2013-04-15 DIAGNOSIS — E1165 Type 2 diabetes mellitus with hyperglycemia: Secondary | ICD-10-CM

## 2013-04-15 DIAGNOSIS — I272 Pulmonary hypertension, unspecified: Secondary | ICD-10-CM

## 2013-04-15 DIAGNOSIS — I5030 Unspecified diastolic (congestive) heart failure: Secondary | ICD-10-CM

## 2013-04-15 DIAGNOSIS — I509 Heart failure, unspecified: Secondary | ICD-10-CM

## 2013-04-15 DIAGNOSIS — I2510 Atherosclerotic heart disease of native coronary artery without angina pectoris: Secondary | ICD-10-CM

## 2013-04-15 NOTE — Progress Notes (Signed)
Patient ID: Matthew Miles, male    DOB: [birth year], 80 y.o.   MRN: [id number]  HPI Comments: Matthew Miles is a 80 yo-old gentleman with history of coronary artery disease, moderate pulmonary hypertension in early 2013, previous MI with occluded LAD, long history of smoking for 60 years, history of bronchitis requiring several courses of antibiotics at the end of 2012, prednisone and nebulizers with admission to the hospital in December for hematemesis and gastric ulcer seen on EGD, found to have COPD and oxygenation down to 83% with ambulation, nonsustained VT per the notes, acute renal failure who presents for followup.   left and right heart cath done in February 2013 showing pulmonary hypertension, stable severe coronary artery disease as below. Normal ejection fraction  Right heart catheterization showed significant fluid overload with wedge pressure of 24, moderate pulmonary hypertension. Catheterization showed occluded LAD which was old, no other significant stenoses requiring intervention.  In followup today, his weight continues to climb. Previously 166 pounds, then 178 pounds, now 194 pounds in 2014 . He reports that he is eating too much and the wrong foods . Denies any worsening lower extremity edema. He take torsemide 20 mg in the morning, 10 mg in the evening. Sometimes takes 20 mg at lunch . He reports his sugars have been higher. He does not know the details though reports hemoglobin A1c in the 7 range . Some expiratory wheezing  Reports that he sleeps a lot, PACs most of the day. He is relatively inactive does not do any exercise   EKG shows normal sinus rhythm with rate 81 beats per minute, left anterior fascicular block, old anterior infarct       Outpatient Encounter Prescriptions as of 04/15/2013  Medication Sig  . aspirin 81 MG tablet Take 81 mg by mouth daily.  . Blood Glucose Monitoring Suppl (ONE TOUCH ULTRA SYSTEM KIT) W/DEVICE KIT 1 kit by Does not apply route once.   . carvedilol (COREG) 3.125 MG tablet TAKE 1 TABLET BY MOUTH 2 TIMES A DAY  . Cyanocobalamin (B-12) 100 MCG TABS Take by mouth daily.  Marland Kitchen glipiZIDE (GLUCOTROL XL) 5 MG 24 hr tablet TAKE 2 TABLETS IN THE MORNING AND 1 TABLET AT SUPPER TIME.  Marland Kitchen glucose blood test strip One touch ultra test strips. Use to check blood sugar two times a day. Dx. 250.00  . nitroGLYCERIN (NITROSTAT) 0.4 MG SL tablet Place 1 tablet (0.4 mg total) under the tongue every 5 (five) minutes as needed.  . nystatin-triamcinolone (MYCOLOG II) cream   . omeprazole (PRILOSEC) 20 MG capsule Take 1 capsule (20 mg total) by mouth 2 (two) times daily. Twice daily before meals  . potassium chloride SA (KLOR-CON M20) 20 MEQ tablet TAKE 1 TABLET BY MOUTH EVERY DAY  . pravastatin (PRAVACHOL) 40 MG tablet Take 1 tablet (40 mg total) by mouth daily.  Marland Kitchen torsemide (DEMADEX) 10 MG tablet Take 1 tablet (10 mg total) by mouth 2 (two) times daily as needed.  . torsemide (DEMADEX) 20 MG tablet TAKE 1 TABLET (20 MG TOTAL) BY MOUTH 2 (TWO) TIMES DAILY.     Review of Systems  HENT: Negative.   Eyes: Negative.   Cardiovascular: Positive for leg swelling.  Gastrointestinal: Negative.   Musculoskeletal: Negative.   Skin: Negative.   Neurological: Negative.   Psychiatric/Behavioral: Negative.   All other systems reviewed and are negative.   BP 147/76  Pulse 81  Ht 5' 2"  (1.575 m)  Wt 193 lb 8 oz (87.771 kg)  BMI 35.38 kg/m2  Physical Exam  Nursing note and vitals reviewed. Constitutional: He is oriented to person, place, and time. He appears well-developed and well-nourished.  HENT:  Head: Normocephalic.  Nose: Nose normal.  Mouth/Throat: Oropharynx is clear and moist.  Eyes: Conjunctivae are normal. Pupils are equal, round, and reactive to light.  Neck: Normal range of motion. Neck supple. No JVD present.  Cardiovascular: Normal rate, regular rhythm, S1 normal, S2 normal and intact distal pulses.  Frequent extrasystoles are present.  Exam reveals no gallop and no friction rub.   Murmur heard.  Systolic murmur is present with a grade of 2/6  Trace edema around the ankles  Pulmonary/Chest: Effort normal and breath sounds normal. No respiratory distress. He has no wheezes. He has no rales. He exhibits no tenderness.  Abdominal: Soft. Bowel sounds are normal. He exhibits no distension. There is no tenderness.  Musculoskeletal: Normal range of motion. He exhibits no edema and no tenderness.  Lymphadenopathy:    He has no cervical adenopathy.  Neurological: He is alert and oriented to person, place, and time. Coordination normal.  Skin: Skin is warm and dry. No rash noted. No erythema.  Psychiatric: He has a normal mood and affect. His behavior is normal. Judgment and thought content normal.      Assessment and Plan

## 2013-04-15 NOTE — Assessment & Plan Note (Signed)
Currently with no symptoms of angina. No further workup at this time. Continue current medication regimen. 

## 2013-04-15 NOTE — Patient Instructions (Signed)
Weight is up 30 pounds since April 2014 Now 193 pounds on our scale Watch your diet.  No medication changes were made.  Please call us if you have new issues that need to be addressed before your next appt.  Your physician wants you to follow-up in: 6 months.  You will receive a reminder letter in the mail two months in advance. If you don't receive a letter, please call our office to schedule the follow-up appointment.

## 2013-04-15 NOTE — Assessment & Plan Note (Signed)
Appears relatively euvolemic on today's visit. Shortness of breath likely from underlying obesity. Some expiratory wheezing again likely exacerbated by 30 pound weight gain

## 2013-04-15 NOTE — Assessment & Plan Note (Signed)
He'll continue on his current diuretic regimen. Will need periodic basic metabolic panel

## 2013-04-15 NOTE — Assessment & Plan Note (Signed)
Weight is up 30 pounds over the past year. We spent most of today's visit talking about ways to limit his fluid intake and make the right food choices. This is a major issue for him and will affect all of his parameters including cholesterol, blood pressure, diabetes

## 2013-04-15 NOTE — Assessment & Plan Note (Signed)
We have encouraged continued exercise, careful diet management in an effort to lose weight. 

## 2013-04-16 ENCOUNTER — Telehealth: Payer: Self-pay | Admitting: Internal Medicine

## 2013-04-16 DIAGNOSIS — E785 Hyperlipidemia, unspecified: Secondary | ICD-10-CM

## 2013-04-16 DIAGNOSIS — K219 Gastro-esophageal reflux disease without esophagitis: Secondary | ICD-10-CM

## 2013-04-16 MED ORDER — GLIPIZIDE ER 5 MG PO TB24: ORAL_TABLET | ORAL | Status: DC

## 2013-04-16 MED ORDER — PRAVASTATIN SODIUM 40 MG PO TABS: 40.0000 mg | ORAL_TABLET | Freq: Every day | ORAL | Status: DC

## 2013-04-16 MED ORDER — POTASSIUM CHLORIDE CRYS ER 20 MEQ PO TBCR: EXTENDED_RELEASE_TABLET | ORAL | Status: DC

## 2013-04-16 MED ORDER — CARVEDILOL 3.125 MG PO TABS: ORAL_TABLET | ORAL | Status: DC

## 2013-04-16 MED ORDER — TORSEMIDE 20 MG PO TABS: ORAL_TABLET | ORAL | Status: DC

## 2013-04-16 MED ORDER — OMEPRAZOLE 20 MG PO CPDR: 20.0000 mg | DELAYED_RELEASE_CAPSULE | Freq: Two times a day (BID) | ORAL | Status: DC

## 2013-04-16 NOTE — Telephone Encounter (Signed)
Prescription sent to pharmacy.

## 2013-04-16 NOTE — Addendum Note (Signed)
Addended by: Ronaldo Miyamoto on: 04/16/2013 11:23 AM   Modules accepted: Orders

## 2013-04-16 NOTE — Telephone Encounter (Signed)
Pt came into office with new insurance card requesting medications be sent to mail order.  [employer] ins.  [facility] [address] [place] [address], [phone].    Needs:  Carvedilol Glipizide Omeprazole Pravastatin Torsemide Klor-Con

## 2013-04-18 ENCOUNTER — Telehealth: Payer: Self-pay | Admitting: Emergency Medicine

## 2013-04-18 NOTE — Telephone Encounter (Signed)
Patient has been approved for 4 visits that exp on 07/14/2013 with [name].

## 2013-05-02 ENCOUNTER — Other Ambulatory Visit: Payer: Self-pay

## 2013-05-02 MED ORDER — CARVEDILOL 3.125 MG PO TABS: ORAL_TABLET | ORAL | Status: DC

## 2013-05-13 ENCOUNTER — Telehealth: Payer: Self-pay | Admitting: Internal Medicine

## 2013-05-13 MED ORDER — GLUCOSE BLOOD VI STRP: ORAL_STRIP | Status: DC

## 2013-05-13 MED ORDER — ONETOUCH ULTRA SYSTEM W/DEVICE KIT: 1.0000 | PACK | Freq: Once | Status: DC

## 2013-05-13 NOTE — Telephone Encounter (Signed)
Rx faxed

## 2013-05-13 NOTE — Telephone Encounter (Signed)
Patient now has [employer] and needs a prescription called into [facility] for a new meter and strips for his blood sugar. Their fax number is [phone]. His [employer] ID is [id number]

## 2013-05-17 ENCOUNTER — Telehealth: Payer: Self-pay | Admitting: Internal Medicine

## 2013-05-17 ENCOUNTER — Encounter: Payer: Self-pay | Admitting: Family Medicine

## 2013-05-17 ENCOUNTER — Ambulatory Visit (INDEPENDENT_AMBULATORY_CARE_PROVIDER_SITE_OTHER): Payer: Medicare HMO | Admitting: Family Medicine

## 2013-05-17 VITALS — BP 130/60 | HR 71 | Temp 99.4°F | Wt 190.0 lb

## 2013-05-17 DIAGNOSIS — J329 Chronic sinusitis, unspecified: Secondary | ICD-10-CM

## 2013-05-17 MED ORDER — BENZONATATE 100 MG PO CAPS: 100.0000 mg | ORAL_CAPSULE | Freq: Two times a day (BID) | ORAL | Status: DC | PRN

## 2013-05-17 MED ORDER — DOXYCYCLINE HYCLATE 100 MG PO CAPS
100.0000 mg | ORAL_CAPSULE | Freq: Two times a day (BID) | ORAL | Status: DC
Start: 2013-05-17 — End: 2013-10-15

## 2013-05-17 NOTE — Telephone Encounter (Signed)
Fwd to Dr. Walker 

## 2013-05-17 NOTE — Telephone Encounter (Signed)
Patient Information:  Caller Name: [name]  Phone: [phone]  Patient: Matthew Miles  Gender: Male  DOB: [birth year]  Age: 80 Years  PCP: Ronette Deter (Adults only)  Office Follow Up:  Does the office need to follow up with this patient?: No  Instructions For The Office: N/A  RN Note:  Due to extensive heart and respiratory PMH and caller reporting "he Miles something before the weekend" appointment for evaluation today was advised. No available appts at [facility] location. Patient and Caller agreeable to an appointment at [facility] location. Appointment scheduled for today, 05/17/13, at 2pm.  Symptoms  Reason For Call & Symptoms: Nasal Congestion and Cough not relieved by OTC and home remedies and caller states "he Miles something before the weekend."  Reviewed Health History In EMR: Yes  Reviewed Medications In EMR: Yes  Reviewed Allergies In EMR: Yes  Reviewed Surgeries / Procedures: Yes  Date of Onset of Symptoms: 05/15/2013  Guideline(s) Used:  Colds  Disposition Per Guideline:   Home Care  Reason For Disposition Reached:   Colds with no complications  Advice Given:  Call Back If:  Difficulty breathing occurs  You become worse  RN Overrode Recommendation:  Make Appointment  See RN Note.  Appointment Scheduled:  05/17/2013 14:00:00 Appointment Scheduled Provider:  Colin Benton

## 2013-05-17 NOTE — Progress Notes (Signed)
Chief Complaint  Patient presents with  . Cough    congestion, wheezing     HPI:  -started: 4 days ago -symptoms:nasal congestion, sore throat, cough, drainage in throat, daughter thought wheezing but pt denies SOB or wheezing, sinus pressure -denies:fever, SOB, NVD, tooth pain, pt denies body aches -has tried: Engineer, water -sick contacts/travel/risks: denies flu exposure or Ebola risks -Hx of: COPD but reports only uses alb occ for this  ROS: See pertinent positives and negatives per HPI.  Past Medical History  Diagnosis Date  . COPD (chronic obstructive pulmonary disease)   . Smoker   . Mild hypertension   . Hypercholesterolemia   . Upper GI bleed 1980  . Hematemesis/vomiting blood 04/10/11  . Prostate cancer   . Prostate cancer     radiation therapy   . Coronary artery disease   . Heart murmur   . Arthritis   . Chicken pox   . Emphysema of lung   . GERD (gastroesophageal reflux disease)   . Ulcer   . Allergy   . Colon polyps   . Cholecystitis   . Pancreatitis   . CHF (congestive heart failure)     Past Surgical History  Procedure Laterality Date  . Appendectomy    . Stomach surgery      bleeding ulcers, followed by Dr. [name]  . Cardiac catheterization  May 2002 and Feb 2013    [facility]; no stents   . Circumcision    . Sp cholecystomy      Family History  Problem Relation Age of Onset  . Emphysema Sister     smoker  . Prostate cancer Father   . Liver cancer Brother   . Prostate cancer Brother     History   Social History  . Marital Status: Married    Spouse Name: N/A    Number of Children: 2  . Years of Education: N/A   Occupational History  . 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     Social History Main Topics  . Smoking status: Former Smoker -- 1.00 packs/day for 65 years    Types: Cigarettes    Quit date: 04/10/2011  . Smokeless tobacco: Former Systems developer    Types: Chew  . Alcohol Use: No  . Drug Use: No  . Sexual Activity: None   Other  Topics Concern  . None   Social History Narrative   Lives in [place] alone. Wife in nursing home. Has 2 children.      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      Diet - regular diet   Exercise - bike    Current outpatient prescriptions:aspirin 81 MG tablet, Take 81 mg by mouth daily., Disp: , Rfl: ;  Blood Glucose Monitoring Suppl (ONE TOUCH ULTRA SYSTEM KIT) W/DEVICE KIT, 1 kit by Does not apply route once., Disp: 1 each, Rfl: 0;  carvedilol (COREG) 3.125 MG tablet, TAKE 1 TABLET BY MOUTH 2 TIMES A DAY, Disp: 180 tablet, Rfl: 3;  Cyanocobalamin (B-12) 100 MCG TABS, Take by mouth daily., Disp: , Rfl:  glipiZIDE (GLUCOTROL XL) 5 MG 24 hr tablet, TAKE 2 TABLETS IN THE MORNING AND 1 TABLET AT SUPPER TIME., Disp: 270 tablet, Rfl: 3;  glucose blood test strip, One touch ultra test strips. Use to check blood sugar two times a day. Dx. 250.00, Disp: 100 each, Rfl: 11;  nitroGLYCERIN (NITROSTAT) 0.4 MG SL tablet, Place 1 tablet (0.4 mg total) under the tongue every 5 (five) minutes  as needed., Disp: 60 tablet, Rfl: 0 nystatin-triamcinolone (MYCOLOG II) cream, , Disp: , Rfl: ;  omeprazole (PRILOSEC) 20 MG capsule, Take 1 capsule (20 mg total) by mouth 2 (two) times daily. Twice daily before meals, Disp: 180 capsule, Rfl: 3;  potassium chloride SA (KLOR-CON M20) 20 MEQ tablet, TAKE 1 TABLET BY MOUTH EVERY DAY, Disp: 90 tablet, Rfl: 3;  pravastatin (PRAVACHOL) 40 MG tablet, Take 1 tablet (40 mg total) by mouth daily., Disp: 90 tablet, Rfl: 3 torsemide (DEMADEX) 10 MG tablet, Take 1 tablet (10 mg total) by mouth 2 (two) times daily as needed., Disp: 60 tablet, Rfl: 6;  torsemide (DEMADEX) 20 MG tablet, TAKE 1 TABLET (20 MG TOTAL) BY MOUTH 2 (TWO) TIMES DAILY., Disp: 180 tablet, Rfl: 3;  benzonatate (TESSALON) 100 MG capsule, Take 1 capsule (100 mg total) by mouth 2 (two) times daily as needed for cough., Disp: 20 capsule, Rfl: 0 doxycycline (VIBRAMYCIN) 100 MG capsule, Take 1 capsule (100 mg total) by mouth 2  (two) times daily., Disp: 20 capsule, Rfl: 0  EXAM:  Filed Vitals:   05/17/13 1431  BP: 130/60  Pulse: 71  Temp: 99.4 F (37.4 C)    Body mass index is 34.74 kg/(m^2).  GENERAL: vitals reviewed and listed above, alert, oriented, appears well hydrated and in no acute distress  HEENT: atraumatic, conjunttiva clear, no obvious abnormalities on inspection of external nose and ears, normal appearance of ear canals and TMs, clear nasal congestion, mild post oropharyngeal erythema with PND, no tonsillar edema or exudate, no sinus TTP  NECK: no obvious masses on inspection  LUNGS: clear to auscultation bilaterally, no wheezes, rales or rhonchi, good air movement  CV: HRRR, no peripheral edema  MS: moves all extremities without noticeable abnormality  PSYCH: pleasant and cooperative, no obvious depression or anxiety  ASSESSMENT AND PLAN:  Discussed the following assessment and plan:  Sinusitis - Plan: doxycycline (VIBRAMYCIN) 100 MG capsule, benzonatate (TESSALON) 100 MG capsule  -given HPI and exam findings today, a serious infection or illness is unlikely. We discussed potential etiologies, with VURI  Or sinusitis being most likely. We discussed treatment side effects, likely course, antibiotic misuse, transmission, and signs of developing a serious illness. -influenza or acute bronchitis or CAP unlikely as lung exam normal today but advised if worsening to see a doctor immediatly. Discussed prednisone - but he reports this makes his blood sugar bad and prefers to hold off as no SOB. Advised he follow up with his doctor next week. -of course, we advised to return or notify a doctor immediately if symptoms worsen or persist or new concerns arise.    Patient Instructions  INSTRUCTIONS FOR UPPER RESPIRATORY INFECTION:  -plenty of rest and fluids  -As we discussed, we have prescribed a new medication for you at this appointment. We discussed the common and serious potential adverse  effects of this medication and you can review these and more with the pharmacist when you pick up your medication.  Please follow the instructions for use carefully and notify us immediately if you have any problems taking this medication.  -nasal saline wash 2-3 times daily (use prepackaged nasal saline or bottled/distilled water if making your own)   -can use sinex or afrin nasal spray for drainage and nasal congestion - but do NOT use longer then 3-4 days  -in the winter time, using a humidifier at night is helpful (please follow cleaning instructions)  -if you are taking a cough medication - use only as  directed, may also try a teaspoon of honey to coat the throat and throat lozenges  -for sore throat, salt water gargles can help  -follow up if you have fevers, facial pain, tooth pain, difficulty breathing or are worsening or not getting better in 5-7 days      KIM, HANNAH R.

## 2013-05-17 NOTE — Progress Notes (Signed)
Pre visit review using our clinic review tool, if applicable. No additional management support is needed unless otherwise documented below in the visit note. 

## 2013-05-17 NOTE — Patient Instructions (Signed)
INSTRUCTIONS FOR UPPER RESPIRATORY INFECTION:  -plenty of rest and fluids  -As we discussed, we have prescribed a new medication for you at this appointment. We discussed the common and serious potential adverse effects of this medication and you can review these and more with the pharmacist when you pick up your medication.  Please follow the instructions for use carefully and notify us immediately if you have any problems taking this medication.  -nasal saline wash 2-3 times daily (use prepackaged nasal saline or bottled/distilled water if making your own)   -can use sinex or afrin nasal spray for drainage and nasal congestion - but do NOT use longer then 3-4 days  -in the winter time, using a humidifier at night is helpful (please follow cleaning instructions)  -if you are taking a cough medication - use only as directed, may also try a teaspoon of honey to coat the throat and throat lozenges  -for sore throat, salt water gargles can help  -follow up if you have fevers, facial pain, tooth pain, difficulty breathing or are worsening or not getting better in 5-7 days

## 2013-08-30 ENCOUNTER — Other Ambulatory Visit (INDEPENDENT_AMBULATORY_CARE_PROVIDER_SITE_OTHER): Payer: Commercial Managed Care - HMO

## 2013-08-30 DIAGNOSIS — IMO0002 Reserved for concepts with insufficient information to code with codable children: Secondary | ICD-10-CM

## 2013-08-30 DIAGNOSIS — D509 Iron deficiency anemia, unspecified: Secondary | ICD-10-CM

## 2013-08-30 DIAGNOSIS — IMO0001 Reserved for inherently not codable concepts without codable children: Secondary | ICD-10-CM

## 2013-08-30 DIAGNOSIS — E1165 Type 2 diabetes mellitus with hyperglycemia: Secondary | ICD-10-CM

## 2013-08-30 LAB — CBC WITH DIFFERENTIAL/PLATELET
Basophils Absolute: 0 10*3/uL (ref 0.0–0.1)
Basophils Relative: 0.6 % (ref 0.0–3.0)
EOS ABS: 0 10*3/uL (ref 0.0–0.7)
EOS PCT: 0.8 % (ref 0.0–5.0)
HEMATOCRIT: 35.8 % — AB (ref 39.0–52.0)
Hemoglobin: 12.1 g/dL — ABNORMAL LOW (ref 13.0–17.0)
LYMPHS ABS: 1 10*3/uL (ref 0.7–4.0)
Lymphocytes Relative: 17.1 % (ref 12.0–46.0)
MCHC: 33.9 g/dL (ref 30.0–36.0)
MCV: 88.1 fl (ref 78.0–100.0)
Monocytes Absolute: 0.7 10*3/uL (ref 0.1–1.0)
Monocytes Relative: 12.1 % — ABNORMAL HIGH (ref 3.0–12.0)
Neutro Abs: 3.9 10*3/uL (ref 1.4–7.7)
Neutrophils Relative %: 69.4 % (ref 43.0–77.0)
Platelets: 125 10*3/uL — ABNORMAL LOW (ref 150.0–400.0)
RBC: 4.06 Mil/uL — ABNORMAL LOW (ref 4.22–5.81)
RDW: 14.5 % (ref 11.5–15.5)
WBC: 5.7 10*3/uL (ref 4.0–10.5)

## 2013-08-30 LAB — COMPREHENSIVE METABOLIC PANEL
ALBUMIN: 3.7 g/dL (ref 3.5–5.2)
ALK PHOS: 73 U/L (ref 39–117)
ALT: 19 U/L (ref 0–53)
AST: 17 U/L (ref 0–37)
BILIRUBIN TOTAL: 1 mg/dL (ref 0.2–1.2)
BUN: 22 mg/dL (ref 6–23)
CO2: 28 mEq/L (ref 19–32)
Calcium: 8.6 mg/dL (ref 8.4–10.5)
Chloride: 104 mEq/L (ref 96–112)
Creatinine, Ser: 1.4 mg/dL (ref 0.4–1.5)
GFR: 53.68 mL/min — ABNORMAL LOW (ref >60.00)
Glucose, Bld: 169 mg/dL — ABNORMAL HIGH (ref 70–99)
POTASSIUM: 4.2 meq/L (ref 3.5–5.1)
SODIUM: 139 meq/L (ref 135–145)
Total Protein: 5.9 g/dL — ABNORMAL LOW (ref 6.0–8.3)

## 2013-08-30 LAB — HEMOGLOBIN A1C: Hgb A1c MFr Bld: 8.1 % — ABNORMAL HIGH (ref 4.6–6.5)

## 2013-08-30 LAB — FERRITIN: Ferritin: 27.3 ng/mL (ref 22.0–322.0)

## 2013-09-04 ENCOUNTER — Encounter: Payer: Self-pay | Admitting: Internal Medicine

## 2013-09-04 ENCOUNTER — Ambulatory Visit (INDEPENDENT_AMBULATORY_CARE_PROVIDER_SITE_OTHER): Payer: Medicare HMO | Admitting: Internal Medicine

## 2013-09-04 VITALS — BP 120/58 | HR 89 | Temp 98.1°F | Ht 63.0 in | Wt 192.5 lb

## 2013-09-04 DIAGNOSIS — R0989 Other specified symptoms and signs involving the circulatory and respiratory systems: Secondary | ICD-10-CM

## 2013-09-04 DIAGNOSIS — R0600 Dyspnea, unspecified: Secondary | ICD-10-CM | POA: Insufficient documentation

## 2013-09-04 DIAGNOSIS — R0609 Other forms of dyspnea: Secondary | ICD-10-CM

## 2013-09-04 DIAGNOSIS — E1165 Type 2 diabetes mellitus with hyperglycemia: Secondary | ICD-10-CM

## 2013-09-04 DIAGNOSIS — IMO0001 Reserved for inherently not codable concepts without codable children: Secondary | ICD-10-CM

## 2013-09-04 DIAGNOSIS — IMO0002 Reserved for concepts with insufficient information to code with codable children: Secondary | ICD-10-CM

## 2013-09-04 MED ORDER — LINAGLIPTIN 5 MG PO TABS: 5.0000 mg | ORAL_TABLET | Freq: Every day | ORAL | Status: DC

## 2013-09-04 NOTE — Patient Instructions (Signed)
Start Tradjenta 5mg  daily. Continue Glipizide.  Monitor blood sugar 2-3 times daily. Call immediately if any blood sugars less than 70.  Follow up in 4 weeks.

## 2013-09-04 NOTE — Assessment & Plan Note (Signed)
Lab Results  Component Value Date   HGBA1C 8.1* 08/30/2013   BG have been elevated. Will start Tradgenta 5mg  daily. Continue Glipizide. Monitor BG closely at home with follow up in 4 weeks or sooner as needed. Encouraged better compliance with diet and exercise.

## 2013-09-04 NOTE — Assessment & Plan Note (Signed)
Recent increase in dyspnea. Exam today remarkable only for cardiac murmur. Question if repeat ECHO might be helpful, last 2013 showed normal LVF. Will set up cardiology evaluation.

## 2013-09-04 NOTE — Progress Notes (Signed)
Pre visit review using our clinic review tool, if applicable. No additional management support is needed unless otherwise documented below in the visit note. 

## 2013-09-04 NOTE — Progress Notes (Signed)
Subjective:    Patient ID: Matthew Miles, male    DOB: 1928-12-27, 80 y.o.   MRN: [id number]  HPI 80YO male presents for follow up.  DM - BG 160-170s fasting. Over 200 at times post-prandial. He notes some dietary indiscretion. Trying to avoid excess sugar. Compliant with medication. Has not been exercising, except for 71min per day on occasion on stationary bike.  Dyspnea - Notes some recent worsening of shortness of breath with exertion. Gradually worsening for months. No chest pain, palpitations. No cough.  Review of Systems  Constitutional: Negative for fever, chills, activity change, appetite change, fatigue and unexpected weight change.  Eyes: Negative for visual disturbance.  Respiratory: Positive for shortness of breath. Negative for cough.   Cardiovascular: Negative for chest pain, palpitations and leg swelling.  Gastrointestinal: Negative for abdominal pain and abdominal distention.  Genitourinary: Negative for dysuria, urgency and difficulty urinating.  Musculoskeletal: Negative for arthralgias and gait problem.  Skin: Negative for color change and rash.  Hematological: Negative for adenopathy.  Psychiatric/Behavioral: Negative for sleep disturbance and dysphoric mood. The patient is not nervous/anxious.        Objective:    BP 120/58  Pulse 89  Temp(Src) 98.1 F (36.7 C) (Oral)  Ht 5\' 3"  (1.6 m)  Wt 192 lb 8 oz (87.317 kg)  BMI 34.11 kg/m2  SpO2 96% Physical Exam  Constitutional: He is oriented to person, place, and time. He appears well-developed and well-nourished. No distress.  HENT:  Head: Normocephalic and atraumatic.  Right Ear: External ear normal.  Left Ear: External ear normal.  Nose: Nose normal.  Mouth/Throat: Oropharynx is clear and moist. No oropharyngeal exudate.  Eyes: Conjunctivae and EOM are normal. Pupils are equal, round, and reactive to light. Right eye exhibits no discharge. Left eye exhibits no discharge. No scleral icterus.  Neck: Normal  range of motion. Neck supple. No tracheal deviation present. No thyromegaly present.  Cardiovascular: Normal rate and regular rhythm.  Exam reveals no gallop and no friction rub.   Murmur (systolic) heard. Pulmonary/Chest: Effort normal and breath sounds normal. No accessory muscle usage. Not tachypneic. No respiratory distress. He has no decreased breath sounds. He has no wheezes. He has no rhonchi. He has no rales. He exhibits no tenderness.  Musculoskeletal: Normal range of motion. He exhibits no edema.  Lymphadenopathy:    He has no cervical adenopathy.  Neurological: He is alert and oriented to person, place, and time. No cranial nerve deficit. Coordination normal.  Skin: Skin is warm and dry. No rash noted. He is not diaphoretic. No erythema. No pallor.  Psychiatric: He has a normal mood and affect. His behavior is normal. Judgment and thought content normal.          Assessment & Plan:   Problem List Items Addressed This Visit     Unprioritized   Diabetes mellitus type 2, uncontrolled - Primary      Lab Results  Component Value Date   HGBA1C 8.1* 08/30/2013   BG have been elevated. Will start Tradgenta 5mg  daily. Continue Glipizide. Monitor BG closely at home with follow up in 4 weeks or sooner as needed. Encouraged better compliance with diet and exercise.    Relevant Medications      linagliptin (TRADJENTA) tablet   Dyspnea     Recent increase in dyspnea. Exam today remarkable only for cardiac murmur. Question if repeat ECHO might be helpful, last 2013 showed normal LVF. Will set up cardiology evaluation.  Return in about 4 weeks (around 10/02/2013) for Recheck of Diabetes.

## 2013-09-12 ENCOUNTER — Other Ambulatory Visit: Payer: Self-pay | Admitting: *Deleted

## 2013-09-12 MED ORDER — SAXAGLIPTIN HCL 2.5 MG PO TABS: 2.5000 mg | ORAL_TABLET | Freq: Every day | ORAL | Status: DC

## 2013-09-18 ENCOUNTER — Telehealth: Payer: Self-pay | Admitting: *Deleted

## 2013-09-18 NOTE — Telephone Encounter (Signed)
Pt's daughter called [employer] and complained about them refusing to pay for [place] agreed to approve the medication.  Cancelled the Onglyza Rx.

## 2013-10-01 ENCOUNTER — Other Ambulatory Visit: Payer: Medicare HMO

## 2013-10-08 ENCOUNTER — Telehealth: Payer: Self-pay | Admitting: Internal Medicine

## 2013-10-08 ENCOUNTER — Ambulatory Visit (INDEPENDENT_AMBULATORY_CARE_PROVIDER_SITE_OTHER): Payer: Commercial Managed Care - HMO | Admitting: Internal Medicine

## 2013-10-08 ENCOUNTER — Encounter: Payer: Self-pay | Admitting: Internal Medicine

## 2013-10-08 VITALS — BP 120/58 | HR 73 | Temp 98.3°F | Ht 63.0 in | Wt 191.8 lb

## 2013-10-08 DIAGNOSIS — IMO0001 Reserved for inherently not codable concepts without codable children: Secondary | ICD-10-CM

## 2013-10-08 DIAGNOSIS — M545 Low back pain, unspecified: Secondary | ICD-10-CM | POA: Insufficient documentation

## 2013-10-08 DIAGNOSIS — G8929 Other chronic pain: Secondary | ICD-10-CM | POA: Insufficient documentation

## 2013-10-08 DIAGNOSIS — E1165 Type 2 diabetes mellitus with hyperglycemia: Secondary | ICD-10-CM

## 2013-10-08 DIAGNOSIS — IMO0002 Reserved for concepts with insufficient information to code with codable children: Secondary | ICD-10-CM

## 2013-10-08 DIAGNOSIS — L0201 Cutaneous abscess of face: Secondary | ICD-10-CM

## 2013-10-08 DIAGNOSIS — L03211 Cellulitis of face: Secondary | ICD-10-CM | POA: Insufficient documentation

## 2013-10-08 LAB — POCT URINALYSIS DIPSTICK
Bilirubin, UA: NEGATIVE
Blood, UA: NEGATIVE
GLUCOSE UA: NEGATIVE
KETONES UA: NEGATIVE
Nitrite, UA: NEGATIVE
PROTEIN UA: NEGATIVE
Spec Grav, UA: 1.015
Urobilinogen, UA: 0.2
pH, UA: 7

## 2013-10-08 MED ORDER — CYCLOBENZAPRINE HCL 5 MG PO TABS: 5.0000 mg | ORAL_TABLET | Freq: Three times a day (TID) | ORAL | Status: DC | PRN

## 2013-10-08 MED ORDER — LINAGLIPTIN 5 MG PO TABS: 5.0000 mg | ORAL_TABLET | Freq: Every day | ORAL | Status: DC

## 2013-10-08 MED ORDER — GENTAMICIN SULFATE 0.1 % EX OINT: 1.0000 "application " | TOPICAL_OINTMENT | Freq: Three times a day (TID) | CUTANEOUS | Status: DC

## 2013-10-08 NOTE — Progress Notes (Signed)
Subjective:    Patient ID: Matthew Miles., male    DOB: 1928-12-29, 80 y.o.   MRN: [id number]  HPI 80YO male presents for follow up.  Low back pain - started Saturday night. Described as aching lower back. No new activity. No trauma to back. Using IcyHot patches with minimal improvement. Also tried Advil with minimal improvement. No numbness in legs. No weakness. No loss of control of bowel or bladder.  DM - BG typically near 120-150s per his report. One elevated BG of 270. He is trying to follow healthier, lower sugar diet. He has been taking the Tradjenta with no side effects noted.  He is concerned about a rash on his left lateral cheek. Started as a red bump which he has been scratching. No fever, chills.   Review of Systems  Constitutional: Negative for fever, chills, activity change, appetite change, fatigue and unexpected weight change.  Eyes: Negative for visual disturbance.  Respiratory: Negative for cough and shortness of breath.   Cardiovascular: Negative for chest pain, palpitations and leg swelling.  Gastrointestinal: Negative for abdominal pain and abdominal distention.  Genitourinary: Negative for dysuria, urgency and difficulty urinating.  Musculoskeletal: Positive for arthralgias, back pain and myalgias. Negative for gait problem.  Skin: Positive for color change and rash.  Hematological: Negative for adenopathy.  Psychiatric/Behavioral: Negative for sleep disturbance and dysphoric mood. The patient is not nervous/anxious.        Objective:    BP 120/58  Pulse 73  Temp(Src) 98.3 F (36.8 C) (Oral)  Ht 5\' 3"  (1.6 m)  Wt 191 lb 12 oz (86.977 kg)  BMI 33.98 kg/m2  SpO2 95% Physical Exam  Constitutional: He is oriented to person, place, and time. He appears well-developed and well-nourished. No distress.  HENT:  Head: Normocephalic and atraumatic.  Right Ear: External ear normal.  Left Ear: External ear normal.  Nose: Nose normal.  Mouth/Throat:  Oropharynx is clear and moist. No oropharyngeal exudate.  Eyes: Conjunctivae and EOM are normal. Pupils are equal, round, and reactive to light. Right eye exhibits no discharge. Left eye exhibits no discharge. No scleral icterus.  Neck: Normal range of motion. Neck supple. No tracheal deviation present. No thyromegaly present.  Cardiovascular: Normal rate, regular rhythm and normal heart sounds.  Exam reveals no gallop and no friction rub.   No murmur heard. Pulmonary/Chest: Effort normal and breath sounds normal. No accessory muscle usage. Not tachypneic. No respiratory distress. He has no decreased breath sounds. He has no wheezes. He has no rhonchi. He has no rales. He exhibits no tenderness.  Musculoskeletal: He exhibits no edema.       Lumbar back: He exhibits decreased range of motion, tenderness (left lateral lower back) and pain. He exhibits no bony tenderness and no deformity.  Lymphadenopathy:    He has no cervical adenopathy.  Neurological: He is alert and oriented to person, place, and time. No cranial nerve deficit. Coordination normal.  Skin: Skin is warm and dry. Rash noted. Rash is pustular (few small pustules left lateral face with yellow overlying exudate). He is not diaphoretic. There is erythema. No pallor.  Psychiatric: He has a normal mood and affect. His behavior is normal. Judgment and thought content normal.          Assessment & Plan:   Problem List Items Addressed This Visit     Unprioritized   Cellulitis of face     Early cellulitis left lateral face, likely secondary to folliculitis. Will start topical  Gentamicin. Follow up prn.    Relevant Medications      gentamicin (GARAMYCIN) ointment 0.1%   Other Relevant Orders      Urine culture   Diabetes mellitus type 2, uncontrolled - Primary     Blood sugars improved on Tradjenta. Will continue for now. Plan recheck A1c in 11/2013.    Relevant Medications      linagliptin (TRADJENTA) tablet   Other Relevant  Orders      Urine culture   Midline low back pain without sciatica     Low back pain. Likely muscular strain and underlying DJD. Will start Flexeril. Discussed potential side effects of this medication and encouraged him not to drive while taking it. Follow up in 1 week. If no improvement, will consider PT.    Relevant Medications      cyclobenzaprine (FLEXERIL) tablet   Other Relevant Orders      POCT Urinalysis Dipstick (Completed)      Urine culture       Return in about 1 week (around 10/15/2013) for Recheck Back Pain.

## 2013-10-08 NOTE — Progress Notes (Signed)
Pre visit review using our clinic review tool, if applicable. No additional management support is needed unless otherwise documented below in the visit note. 

## 2013-10-08 NOTE — Telephone Encounter (Signed)
At check out pt needs 1 wk f/u.  No appts available.  Please advise.  Pt also states he is to come back in August but not sure if he needs appt with Dr. Gilford Rile or just for blood work to check A1c.  Please advise.

## 2013-10-08 NOTE — Patient Instructions (Signed)
Start Flexeril (cyclobenzaprine) 5mg  up to three times daily as needed for low back pain.  Follow up if symptoms of low back pain not improving this week.  Continue Tradjenta.   Follow up in 11/2013.

## 2013-10-08 NOTE — Assessment & Plan Note (Signed)
Blood sugars improved on Tradjenta. Will continue for now. Plan recheck A1c in 11/2013.

## 2013-10-08 NOTE — Assessment & Plan Note (Signed)
Low back pain. Likely muscular strain and underlying DJD. Will start Flexeril. Discussed potential side effects of this medication and encouraged him not to drive while taking it. Follow up in 1 week. If no improvement, will consider PT.

## 2013-10-08 NOTE — Assessment & Plan Note (Signed)
Early cellulitis left lateral face, likely secondary to folliculitis. Will start topical Gentamicin. Follow up prn.

## 2013-10-09 ENCOUNTER — Telehealth: Payer: Self-pay | Admitting: *Deleted

## 2013-10-09 NOTE — Telephone Encounter (Signed)
Started PA online for Cyclobenzaprine 5 mg, pending approval

## 2013-10-09 NOTE — Telephone Encounter (Signed)
Spoke with pt and scheduled appt °

## 2013-10-10 LAB — URINE CULTURE

## 2013-10-10 NOTE — Telephone Encounter (Signed)
PA approved for cyclobenzaprine until 04/10/14.

## 2013-10-15 ENCOUNTER — Ambulatory Visit (INDEPENDENT_AMBULATORY_CARE_PROVIDER_SITE_OTHER): Payer: Commercial Managed Care - HMO | Admitting: Cardiovascular Disease

## 2013-10-15 ENCOUNTER — Encounter: Payer: Self-pay | Admitting: Cardiovascular Disease

## 2013-10-15 ENCOUNTER — Ambulatory Visit: Payer: Medicare HMO | Admitting: Cardiovascular Disease

## 2013-10-15 VITALS — BP 120/58 | HR 81 | Ht 62.0 in | Wt 192.5 lb

## 2013-10-15 DIAGNOSIS — R011 Cardiac murmur, unspecified: Secondary | ICD-10-CM | POA: Insufficient documentation

## 2013-10-15 DIAGNOSIS — IMO0002 Reserved for concepts with insufficient information to code with codable children: Secondary | ICD-10-CM

## 2013-10-15 DIAGNOSIS — R0602 Shortness of breath: Secondary | ICD-10-CM

## 2013-10-15 DIAGNOSIS — IMO0001 Reserved for inherently not codable concepts without codable children: Secondary | ICD-10-CM

## 2013-10-15 DIAGNOSIS — I2510 Atherosclerotic heart disease of native coronary artery without angina pectoris: Secondary | ICD-10-CM

## 2013-10-15 DIAGNOSIS — I509 Heart failure, unspecified: Secondary | ICD-10-CM

## 2013-10-15 DIAGNOSIS — E1165 Type 2 diabetes mellitus with hyperglycemia: Secondary | ICD-10-CM

## 2013-10-15 DIAGNOSIS — I2789 Other specified pulmonary heart diseases: Secondary | ICD-10-CM

## 2013-10-15 DIAGNOSIS — I5032 Chronic diastolic (congestive) heart failure: Secondary | ICD-10-CM

## 2013-10-15 DIAGNOSIS — I272 Pulmonary hypertension, unspecified: Secondary | ICD-10-CM

## 2013-10-15 DIAGNOSIS — I2583 Coronary atherosclerosis due to lipid rich plaque: Secondary | ICD-10-CM

## 2013-10-15 LAB — HM DIABETES EYE EXAM

## 2013-10-15 NOTE — Assessment & Plan Note (Signed)
We have encouraged continued exercise, careful diet management in an effort to lose weight. 

## 2013-10-15 NOTE — Assessment & Plan Note (Signed)
More prominent murmur on today's exam. Concerning for tricuspid valve regurgitation, unable to exclude aortic valve sclerosis/stenosis. Echocardiogram pending

## 2013-10-15 NOTE — Assessment & Plan Note (Signed)
Currently with no symptoms of angina. No further workup at this time. Continue current medication regimen. 

## 2013-10-15 NOTE — Assessment & Plan Note (Signed)
Weight is unchanged from prior clinic visits. Suggested he continue on torsemide twice a day. Per his report, he is taking 10 mg twice a day

## 2013-10-15 NOTE — Assessment & Plan Note (Addendum)
He does report some shortness of breath. Repeat echocardiogram ordered to estimate right heart pressures

## 2013-10-15 NOTE — Progress Notes (Signed)
Patient ID: Matthew Blank., male    DOB: [birth year], 80 y.o.   MRN: [id number]  HPI Comments: Matthew Miles is a 80 yo-old gentleman with history of coronary artery disease (occluded LAD), moderate pulmonary hypertension in early 2013, long history of smoking for 60 years, history of bronchitis requiring several courses of antibiotics at the end of 2012, prednisone and nebulizers with admission to the hospital in December for hematemesis and gastric ulcer seen on EGD, found to have COPD and oxygenation down to 83% with ambulation, nonsustained VT per the notes, acute renal failure who presents for followup.  In followup today, he reports that his weight is stable. He is taking torsemide 10 mg twice a day. Previously was taking 20 mg. He has minimal leg edema. His biggest complaint is his low back pain. He is started taking ibuprofen on an occasional basis. Also reports that his sugars have been up and down. He does not do any regular exercise.  He is relatively inactive, sedentary most of the day Stable renal function may 2015   left and right heart cath done in February 2013 showing pulmonary hypertension, stable severe coronary artery disease  Normal ejection fraction  Right heart catheterization showed significant fluid overload with wedge pressure of 24, moderate pulmonary hypertension. Catheterization showed occluded LAD which was old, no other significant stenoses requiring intervention.  EKG shows normal sinus rhythm with rate 81 beats per minute, left anterior fascicular block, old anterior infarct       Outpatient Encounter Prescriptions as of 10/15/2013  Medication Sig  . aspirin 81 MG tablet Take 81 mg by mouth daily.  . Blood Glucose Monitoring Suppl (ONE TOUCH ULTRA SYSTEM KIT) W/DEVICE KIT 1 kit by Does not apply route once.  . carvedilol (COREG) 3.125 MG tablet TAKE 1 TABLET BY MOUTH 2 TIMES A DAY  . Cyanocobalamin (B-12) 100 MCG TABS Take by mouth daily.  .  cyclobenzaprine (FLEXERIL) 5 MG tablet Take 1 tablet (5 mg total) by mouth 3 (three) times daily as needed for muscle spasms.  Marland Kitchen gentamicin ointment (GARAMYCIN) 0.1 % Apply 1 application topically 3 (three) times daily.  Marland Kitchen glipiZIDE (GLUCOTROL XL) 5 MG 24 hr tablet TAKE 2 TABLETS IN THE MORNING AND 1 TABLET AT SUPPER TIME.  Marland Kitchen glucose blood test strip One touch ultra test strips. Use to check blood sugar two times a day. Dx. 250.00  . linagliptin (TRADJENTA) 5 MG TABS tablet Take 1 tablet (5 mg total) by mouth daily.  . nitroGLYCERIN (NITROSTAT) 0.4 MG SL tablet Place 1 tablet (0.4 mg total) under the tongue every 5 (five) minutes as needed.  . nystatin-triamcinolone (MYCOLOG II) cream   . omeprazole (PRILOSEC) 20 MG capsule Take 1 capsule (20 mg total) by mouth 2 (two) times daily. Twice daily before meals  . potassium chloride SA (KLOR-CON M20) 20 MEQ tablet TAKE 1 TABLET BY MOUTH EVERY DAY  . pravastatin (PRAVACHOL) 40 MG tablet Take 1 tablet (40 mg total) by mouth daily.  Marland Kitchen torsemide (DEMADEX) 10 MG tablet Take 1 tablet (10 mg total) by mouth 2 (two) times daily as needed.     Review of Systems  Constitutional: Negative.   HENT: Negative.   Eyes: Negative.   Respiratory: Positive for shortness of breath.   Cardiovascular: Positive for leg swelling.  Gastrointestinal: Negative.   Endocrine: Negative.   Musculoskeletal: Negative.   Skin: Negative.   Allergic/Immunologic: Negative.   Neurological: Negative.   Hematological: Negative.   Psychiatric/Behavioral: Negative.  All other systems reviewed and are negative.  BP 120/58  Pulse 81  Ht _0  (1.575 m)  Wt 192 lb 8 oz (87.317 kg)  BMI 35.20 kg/m2  Physical Exam  Nursing note and vitals reviewed. Constitutional: He is oriented to person, place, and time. He appears well-developed and well-nourished.  HENT:  Head: Normocephalic.  Nose: Nose normal.  Mouth/Throat: Oropharynx is clear and moist.  Eyes: Conjunctivae are  normal. Pupils are equal, round, and reactive to light.  Neck: Normal range of motion. Neck supple. No JVD present.  Cardiovascular: Normal rate, regular rhythm, S1 normal, S2 normal and intact distal pulses.  Frequent extrasystoles are present. Exam reveals no gallop and no friction rub.   Murmur heard.  Crescendo systolic murmur is present with a grade of 2/6  Trace edema around the ankles  Pulmonary/Chest: Effort normal and breath sounds normal. No respiratory distress. He has no wheezes. He has no rales. He exhibits no tenderness.  Abdominal: Soft. Bowel sounds are normal. He exhibits no distension. There is no tenderness.  Musculoskeletal: Normal range of motion. He exhibits no edema and no tenderness.  Lymphadenopathy:    He has no cervical adenopathy.  Neurological: He is alert and oriented to person, place, and time. Coordination normal.  Skin: Skin is warm and dry. No rash noted. No erythema.  Psychiatric: He has a normal mood and affect. His behavior is normal. Judgment and thought content normal.      Assessment and Plan

## 2013-10-15 NOTE — Patient Instructions (Signed)
You are doing well. No medication changes were made.  We will schedule an echo to evaluate the heart murmur, heart pressures  Please call us if you have new issues that need to be addressed before your next appt.  Your physician wants you to follow-up in: 3 months.  You will receive a reminder letter in the mail two months in advance. If you don't receive a letter, please call our office to schedule the follow-up appointment.

## 2013-10-16 ENCOUNTER — Encounter: Payer: Self-pay | Admitting: Internal Medicine

## 2013-10-16 ENCOUNTER — Telehealth: Payer: Self-pay | Admitting: Internal Medicine

## 2013-10-16 ENCOUNTER — Ambulatory Visit (INDEPENDENT_AMBULATORY_CARE_PROVIDER_SITE_OTHER): Payer: Commercial Managed Care - HMO | Admitting: Internal Medicine

## 2013-10-16 VITALS — BP 122/50 | HR 86 | Temp 98.1°F | Ht 63.0 in | Wt 193.8 lb

## 2013-10-16 DIAGNOSIS — M545 Low back pain, unspecified: Secondary | ICD-10-CM

## 2013-10-16 NOTE — Progress Notes (Signed)
Pre visit review using our clinic review tool, if applicable. No additional management support is needed unless otherwise documented below in the visit note. 

## 2013-10-16 NOTE — Telephone Encounter (Signed)
At [place] pt states Dr. Gilford Rile needs to know the name of eye medication:  Fluorometholone 0.1% drops apply one drop in affected eye daily x 14 D prn

## 2013-10-16 NOTE — Assessment & Plan Note (Signed)
Symptoms have completely resolved. Likely muscular strain. Will continue to monitor. Flexeril prn.

## 2013-10-16 NOTE — Progress Notes (Signed)
   Subjective:    Patient ID: Matthew Blank., male    DOB: June 04, 1928, 80 y.o.   MRN: [id number]  HPI 80YO male presents to follow up back pain.  Back pain - Symptoms have completely resolved. No longer taking Flexeril.  Seen by eye doctor yesterday. Started on eye drops for allergies. Unsure name of medication.  Also seen by his Cardiologist 7/7 and scheduled for ECHO on 7/21.  Review of Systems  Constitutional: Negative for fever, chills, activity change, appetite change, fatigue and unexpected weight change.  Eyes: Negative for visual disturbance.  Respiratory: Negative for cough and shortness of breath.   Cardiovascular: Negative for chest pain, palpitations and leg swelling.  Gastrointestinal: Negative for abdominal pain and abdominal distention.  Genitourinary: Negative for dysuria, urgency and difficulty urinating.  Musculoskeletal: Negative for arthralgias, back pain, gait problem and myalgias.  Skin: Negative for color change and rash.  Hematological: Negative for adenopathy.  Psychiatric/Behavioral: Negative for sleep disturbance and dysphoric mood. The patient is not nervous/anxious.        Objective:    BP 122/50  Pulse 86  Temp(Src) 98.1 F (36.7 C) (Oral)  Ht 5\' 3"  (1.6 m)  Wt 193 lb 12 oz (87.884 kg)  BMI 34.33 kg/m2  SpO2 95% Physical Exam  Constitutional: He is oriented to person, place, and time. He appears well-developed and well-nourished. No distress.  HENT:  Head: Normocephalic and atraumatic.  Right Ear: External ear normal.  Left Ear: External ear normal.  Nose: Nose normal.  Mouth/Throat: Oropharynx is clear and moist. No oropharyngeal exudate.  Eyes: Conjunctivae and EOM are normal. Pupils are equal, round, and reactive to light. Right eye exhibits no discharge. Left eye exhibits no discharge. No scleral icterus.  Neck: Normal range of motion. Neck supple. No tracheal deviation present. No thyromegaly present.  Cardiovascular: Normal rate,  regular rhythm and normal heart sounds.  Exam reveals no gallop and no friction rub.   No murmur heard. Pulmonary/Chest: Effort normal and breath sounds normal. No accessory muscle usage. Not tachypneic. No respiratory distress. He has no decreased breath sounds. He has no wheezes. He has no rhonchi. He has no rales. He exhibits no tenderness.  Musculoskeletal: Normal range of motion. He exhibits no edema.  Lymphadenopathy:    He has no cervical adenopathy.  Neurological: He is alert and oriented to person, place, and time. No cranial nerve deficit. Coordination normal.  Skin: Skin is warm and dry. No rash noted. He is not diaphoretic. No erythema. No pallor.  Psychiatric: He has a normal mood and affect. His behavior is normal. Judgment and thought content normal.          Assessment & Plan:   Problem List Items Addressed This Visit     Unprioritized   Midline low back pain without sciatica - Primary     Symptoms have completely resolved. Likely muscular strain. Will continue to monitor. Flexeril prn.        Return in about 3 months (around 01/16/2014) for Recheck of Diabetes.

## 2013-10-16 NOTE — Telephone Encounter (Signed)
Added medication to pt's medication list

## 2013-10-29 ENCOUNTER — Other Ambulatory Visit: Payer: Self-pay

## 2013-10-29 ENCOUNTER — Other Ambulatory Visit (INDEPENDENT_AMBULATORY_CARE_PROVIDER_SITE_OTHER): Payer: Commercial Managed Care - HMO

## 2013-10-29 DIAGNOSIS — R0602 Shortness of breath: Secondary | ICD-10-CM

## 2013-10-29 DIAGNOSIS — I272 Pulmonary hypertension, unspecified: Secondary | ICD-10-CM

## 2013-10-29 DIAGNOSIS — R011 Cardiac murmur, unspecified: Secondary | ICD-10-CM

## 2013-10-29 DIAGNOSIS — I2583 Coronary atherosclerosis due to lipid rich plaque: Secondary | ICD-10-CM

## 2013-10-29 DIAGNOSIS — I509 Heart failure, unspecified: Secondary | ICD-10-CM

## 2013-10-29 DIAGNOSIS — I5032 Chronic diastolic (congestive) heart failure: Secondary | ICD-10-CM

## 2013-10-29 DIAGNOSIS — I2510 Atherosclerotic heart disease of native coronary artery without angina pectoris: Secondary | ICD-10-CM

## 2013-10-31 ENCOUNTER — Ambulatory Visit (INDEPENDENT_AMBULATORY_CARE_PROVIDER_SITE_OTHER): Payer: Commercial Managed Care - HMO | Admitting: Adult Health

## 2013-10-31 ENCOUNTER — Encounter: Payer: Self-pay | Admitting: Adult Health

## 2013-10-31 VITALS — BP 106/68 | HR 77 | Temp 98.1°F | Resp 16 | Wt 193.0 lb

## 2013-10-31 DIAGNOSIS — L659 Nonscarring hair loss, unspecified: Secondary | ICD-10-CM

## 2013-10-31 LAB — TSH: TSH: 3.2 u[IU]/mL (ref 0.35–4.50)

## 2013-10-31 NOTE — Progress Notes (Signed)
Pre visit review using our clinic review tool, if applicable. No additional management support is needed unless otherwise documented below in the visit note. 

## 2013-10-31 NOTE — Progress Notes (Signed)
Patient ID: Matthew Miles., male   DOB: 1928/12/23, 80 y.o.   MRN: [id number]   Subjective:    Patient ID: Matthew Miles., male    DOB: October 02, 1928, 80 y.o.   MRN: [id number]  HPI  Presents with concerns of two bald spots on his head. He believes it is coming from [place] for DM.   Past Medical History  Diagnosis Date  . COPD (chronic obstructive pulmonary disease)   . Smoker   . Mild hypertension   . Hypercholesterolemia   . Upper GI bleed 1980  . Hematemesis/vomiting blood 04/10/11  . Prostate cancer   . Prostate cancer     radiation therapy   . Coronary artery disease   . Heart murmur   . Arthritis   . Chicken pox   . Emphysema of lung   . GERD (gastroesophageal reflux disease)   . Ulcer   . Allergy   . Colon polyps   . Cholecystitis   . Pancreatitis   . CHF (congestive heart failure)     Current Outpatient Prescriptions on File Prior to Visit  Medication Sig Dispense Refill  . aspirin 81 MG tablet Take 81 mg by mouth daily.      . Blood Glucose Monitoring Suppl (ONE TOUCH ULTRA SYSTEM KIT) W/DEVICE KIT 1 kit by Does not apply route once.  1 each  0  . carvedilol (COREG) 3.125 MG tablet TAKE 1 TABLET BY MOUTH 2 TIMES A DAY  180 tablet  3  . Cyanocobalamin (B-12) 100 MCG TABS Take by mouth daily.      . cyclobenzaprine (FLEXERIL) 5 MG tablet Take 1 tablet (5 mg total) by mouth 3 (three) times daily as needed for muscle spasms.  30 tablet  1  . fluorometholone (EFLONE) 0.1 % ophthalmic suspension 1 drop 4 (four) times daily.      Marland Kitchen gentamicin ointment (GARAMYCIN) 0.1 % Apply 1 application topically 3 (three) times daily.  15 g  0  . glipiZIDE (GLUCOTROL XL) 5 MG 24 hr tablet TAKE 2 TABLETS IN THE MORNING AND 1 TABLET AT SUPPER TIME.  270 tablet  3  . glucose blood test strip One touch ultra test strips. Use to check blood sugar two times a day. Dx. 250.00  100 each  11  . linagliptin (TRADJENTA) 5 MG TABS tablet Take 1 tablet (5 mg total) by mouth daily.  90 tablet   3  . nitroGLYCERIN (NITROSTAT) 0.4 MG SL tablet Place 1 tablet (0.4 mg total) under the tongue every 5 (five) minutes as needed.  60 tablet  0  . nystatin-triamcinolone (MYCOLOG II) cream       . omeprazole (PRILOSEC) 20 MG capsule Take 1 capsule (20 mg total) by mouth 2 (two) times daily. Twice daily before meals  180 capsule  3  . potassium chloride SA (KLOR-CON M20) 20 MEQ tablet TAKE 1 TABLET BY MOUTH EVERY DAY  90 tablet  3  . pravastatin (PRAVACHOL) 40 MG tablet Take 1 tablet (40 mg total) by mouth daily.  90 tablet  3  . torsemide (DEMADEX) 10 MG tablet Take 1 tablet (10 mg total) by mouth 2 (two) times daily as needed.  60 tablet  6   No current facility-administered medications on file prior to visit.     Review of Systems Positive for alopecia Negative for itching, dryness, scaly skin    Objective:  BP 106/68  Pulse 77  Temp(Src) 98.1 F (36.7 C) (Oral)  Resp 16  Wt 193 lb (87.544 kg)  SpO2 95%   Physical Exam  Constitutional: He is oriented to person, place, and time. He appears well-developed and well-nourished. No distress.  HENT:  Head: Normocephalic and atraumatic.    Eyes: Conjunctivae and EOM are normal.  Neck: Neck supple.  Cardiovascular: Normal rate and regular rhythm.   Pulmonary/Chest: Effort normal. No respiratory distress.  Musculoskeletal: Normal range of motion.  Neurological: He is alert and oriented to person, place, and time. Coordination normal.  Skin: Skin is warm and dry.  Psychiatric: He has a normal mood and affect. His behavior is normal. Judgment and thought content normal.      Assessment & Plan:   1. Hair loss Pt reports that these 2 bald spots started after he began taking Tradjenta. There is no documented alopecia that I found either on Uptodate or Epocrates. I have asked patient to stop the tradjenta and increase the Glipizide XR to 10 mg. He is currently taking 5 mg daily. His last A1c is 8.1% and slightly above target (below 8%).  I have instructed pt to take food with this medication and eat at regular intervals as hypoglycemia can occur. I also instructed him to hold this medication if ever has virus, etc and unable to eat. Pt verbalized understanding. He will return for follow up with PCP along with A1c in 1 month. I will go ahead and check thyroid level but pattern of alopecia is not consistent with altered thyroid. - TSH

## 2013-10-31 NOTE — Patient Instructions (Addendum)
  Stop the Tradjenta to see if this improves your 2 bald patches.  You are currently taking Glipizide 5 mg XR in the morning with breakfast. Please start taking 2 pills to make 10 mg with breakfast. This medication must be taken with food. If you are ever sick and not able to eat you will need to hold this medication because it may drop your blood sugar.  Return to see your doctor in 1 month for your A1c.

## 2013-11-01 ENCOUNTER — Encounter: Payer: Self-pay | Admitting: *Deleted

## 2013-12-10 ENCOUNTER — Telehealth: Payer: Self-pay | Admitting: Internal Medicine

## 2013-12-10 DIAGNOSIS — E119 Type 2 diabetes mellitus without complications: Secondary | ICD-10-CM

## 2013-12-10 NOTE — Telephone Encounter (Signed)
Pt came in and stated needed a prescription for diabetic shoes. Stated Dr. Gilford Rile wrote one for last year and is needing more shoes. He said the place is over at [facility] but didn't remember the name of it.

## 2013-12-11 NOTE — Telephone Encounter (Signed)
Pt needs a new referral to podiatrist, pt states there is a new physician in that office now

## 2013-12-18 LAB — HM DIABETES FOOT EXAM

## 2013-12-26 ENCOUNTER — Encounter: Payer: Self-pay | Admitting: *Deleted

## 2013-12-26 ENCOUNTER — Ambulatory Visit (INDEPENDENT_AMBULATORY_CARE_PROVIDER_SITE_OTHER): Payer: Commercial Managed Care - HMO

## 2013-12-26 ENCOUNTER — Ambulatory Visit (INDEPENDENT_AMBULATORY_CARE_PROVIDER_SITE_OTHER): Payer: Commercial Managed Care - HMO | Admitting: Podiatry

## 2013-12-26 ENCOUNTER — Encounter: Payer: Self-pay | Admitting: Podiatry

## 2013-12-26 ENCOUNTER — Other Ambulatory Visit: Payer: Self-pay | Admitting: *Deleted

## 2013-12-26 VITALS — BP 97/45 | HR 107 | Resp 20 | Ht 63.0 in | Wt 192.0 lb

## 2013-12-26 DIAGNOSIS — M2040 Other hammer toe(s) (acquired), unspecified foot: Secondary | ICD-10-CM

## 2013-12-26 DIAGNOSIS — E119 Type 2 diabetes mellitus without complications: Secondary | ICD-10-CM

## 2013-12-26 DIAGNOSIS — Q6650 Congenital pes planus, unspecified foot: Secondary | ICD-10-CM

## 2013-12-26 NOTE — Progress Notes (Signed)
   Subjective:    Patient ID: Matthew Miles., male    DOB: 05/27/1928, 80 y.o.   MRN: [id number]  HPI Comments: Want to get diabetic shoes. Blood sugar this morning was 162 . Been diabetic for at least 2 years. No pain in the feet .   Diabetes      Review of Systems  Endocrine:       Diabetes        Objective:   Physical Exam: I have reviewed his past medical history medications allergies surgeries social history and review of systems. Pulses are palpable. Neurologic sensorium is intact per since once the monofilament. Deep tendon reflexes are intact bilateral muscle strength is 5 over 5 dorsiflexors plantar flexors inverters everters all intrinsic musculature is intact. Orthopedic evaluation demonstrates hammertoe deformities mild HAV deformities of a symptomatic bilateral. Radiographic evaluation does not demonstrate any type of osseous abnormalities. Cutaneous evaluation demonstrates supple well hydrated cutis no open lesions no ecchymosis no signs of this skin infection.          Assessment & Plan:  Assessment: Diabetes mellitus non-complicated with hammertoe deformities.  Plan: He was measured for a pair shoes.

## 2014-01-17 ENCOUNTER — Encounter: Payer: Self-pay | Admitting: Internal Medicine

## 2014-01-17 ENCOUNTER — Ambulatory Visit (INDEPENDENT_AMBULATORY_CARE_PROVIDER_SITE_OTHER): Payer: Commercial Managed Care - HMO | Admitting: Internal Medicine

## 2014-01-17 VITALS — BP 108/52 | HR 86 | Temp 98.4°F | Ht 63.0 in | Wt 189.0 lb

## 2014-01-17 DIAGNOSIS — IMO0002 Reserved for concepts with insufficient information to code with codable children: Secondary | ICD-10-CM

## 2014-01-17 DIAGNOSIS — E785 Hyperlipidemia, unspecified: Secondary | ICD-10-CM

## 2014-01-17 DIAGNOSIS — Z23 Encounter for immunization: Secondary | ICD-10-CM

## 2014-01-17 DIAGNOSIS — J449 Chronic obstructive pulmonary disease, unspecified: Secondary | ICD-10-CM

## 2014-01-17 DIAGNOSIS — E1165 Type 2 diabetes mellitus with hyperglycemia: Secondary | ICD-10-CM

## 2014-01-17 DIAGNOSIS — I5032 Chronic diastolic (congestive) heart failure: Secondary | ICD-10-CM

## 2014-01-17 LAB — COMPREHENSIVE METABOLIC PANEL
ALT: 14 U/L (ref 0–53)
AST: 17 U/L (ref 0–37)
Albumin: 3.2 g/dL — ABNORMAL LOW (ref 3.5–5.2)
Alkaline Phosphatase: 71 U/L (ref 39–117)
BUN: 31 mg/dL — ABNORMAL HIGH (ref 6–23)
CALCIUM: 8.6 mg/dL (ref 8.4–10.5)
CHLORIDE: 104 meq/L (ref 96–112)
CO2: 24 mEq/L (ref 19–32)
CREATININE: 1.5 mg/dL (ref 0.4–1.5)
GFR: 48.23 mL/min — ABNORMAL LOW (ref >60.00)
Glucose, Bld: 210 mg/dL — ABNORMAL HIGH (ref 70–99)
POTASSIUM: 3.9 meq/L (ref 3.5–5.1)
SODIUM: 135 meq/L (ref 135–145)
TOTAL PROTEIN: 6.2 g/dL (ref 6.0–8.3)
Total Bilirubin: 0.8 mg/dL (ref 0.2–1.2)

## 2014-01-17 LAB — LIPID PANEL
CHOL/HDL RATIO: 4
Cholesterol: 121 mg/dL (ref 0–200)
HDL: 28.4 mg/dL — ABNORMAL LOW (ref >39.00)
LDL Cholesterol: 63 mg/dL (ref 0–99)
NONHDL: 92.6
TRIGLYCERIDES: 147 mg/dL (ref 0.0–149.0)
VLDL: 29.4 mg/dL (ref 0.0–40.0)

## 2014-01-17 LAB — MICROALBUMIN / CREATININE URINE RATIO
Microalb Creat Ratio: 0.4 mg/g (ref 0.0–30.0)
Microalb, Ur: 0.3 mg/dL (ref 0.0–1.9)

## 2014-01-17 LAB — HEMOGLOBIN A1C: Hgb A1c MFr Bld: 7.2 % — ABNORMAL HIGH (ref 4.6–6.5)

## 2014-01-17 MED ORDER — GLIPIZIDE ER 10 MG PO TB24: 10.0000 mg | ORAL_TABLET | Freq: Every day | ORAL | Status: DC

## 2014-01-17 MED ORDER — GLIPIZIDE ER 10 MG PO TB24: 10.0000 mg | ORAL_TABLET | Freq: Two times a day (BID) | ORAL | Status: DC

## 2014-01-17 NOTE — Progress Notes (Signed)
Subjective:    Patient ID: Matthew Miles., male    DOB: October 18, 1928, 80 y.o.   MRN: [id number]  HPI 80YO male presents for follow up.  DM - Stopped taking Tradjenta because of hair loss. Taking Glipizide. Checking blood sugars daily. Typically runs 130-160s fasting. Notes some dietary indiscretion.  No recent chest pain or shortness of breath.   Notes some aching pain in right shoulder and hips with cooler weather. Took advil with some improvement.  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  Review of Systems  Constitutional: Negative for fever, chills, activity change, appetite change, fatigue and unexpected weight change.  Eyes: Negative for visual disturbance.  Respiratory: Negative for cough and shortness of breath.   Cardiovascular: Negative for chest pain, palpitations and leg swelling.  Gastrointestinal: Negative for nausea, vomiting, abdominal pain, diarrhea, constipation and abdominal distention.  Genitourinary: Negative for dysuria, urgency and difficulty urinating.  Musculoskeletal: Positive for arthralgias. Negative for gait problem.  Skin: Negative for color change and rash.  Hematological: Negative for adenopathy.  Psychiatric/Behavioral: Negative for sleep disturbance and dysphoric mood. The patient is not nervous/anxious.        Objective:    BP 108/52  Pulse 86  Temp(Src) 98.4 F (36.9 C) (Oral)  Ht 5\' 3"  (1.6 m)  Wt 189 lb (85.73 kg)  BMI 33.49 kg/m2  SpO2 97% Physical Exam  Constitutional: He is oriented to person, place, and time. He appears well-developed and well-nourished. No distress.  HENT:  Head: Normocephalic and atraumatic.  Right Ear: External ear normal.  Left Ear: External ear normal.  Nose: Nose normal.  Mouth/Throat: Oropharynx is clear and moist. No oropharyngeal exudate.  Eyes: Conjunctivae and EOM are normal. Pupils are equal, round, and reactive to light. Right eye exhibits no discharge. Left eye exhibits no  discharge. No scleral icterus.  Neck: Normal range of motion. Neck supple. No tracheal deviation present. No thyromegaly present.  Cardiovascular: Normal rate and regular rhythm.  Exam reveals no gallop and no friction rub.   Murmur heard. Pulmonary/Chest: Effort normal and breath sounds normal. No accessory muscle usage. Not tachypneic. No respiratory distress. He has no decreased breath sounds. He has no wheezes. He has no rhonchi. He has no rales. He exhibits no tenderness.  Musculoskeletal: Normal range of motion. He exhibits no edema.  Lymphadenopathy:    He has no cervical adenopathy.  Neurological: He is alert and oriented to person, place, and time. No cranial nerve deficit. Coordination normal.  Skin: Skin is warm and dry. No rash noted. He is not diaphoretic. No erythema. No pallor.  Psychiatric: He has a normal mood and affect. His behavior is normal. Judgment and thought content normal.          Assessment & Plan:   Problem List Items Addressed This Visit     Unprioritized   Chronic diastolic CHF (congestive heart failure)     Symptomatically doing well. Appears euvolemic on exam. Continue current medications. Follow up with Cardiology as scheduled.    COPD (chronic obstructive pulmonary disease)     Symptomatically doing well with no recent episodes of bronchitis. Will continue to monitor.    Diabetes mellitus type 2, uncontrolled - Primary     Will check A1c with labs today. Will change Glipizide dosing to 10mg  twice daily. Follow up in 3 months or sooner as needed.    Relevant Medications      glipiZIDE (GLUCOTROL XL) 24 hr tablet   Other Relevant Orders  Comprehensive metabolic panel      Hemoglobin A1c      Lipid panel      Microalbumin / creatinine urine ratio   Hyperlipidemia     Will check lipids with labs today. Continue pravastatin.        Return in about 3 months (around 04/19/2014) for Recheck of Diabetes.

## 2014-01-17 NOTE — Assessment & Plan Note (Signed)
Symptomatically doing well with no recent episodes of bronchitis. Will continue to monitor.

## 2014-01-17 NOTE — Assessment & Plan Note (Signed)
Will check lipids with labs today. Continue pravastatin.

## 2014-01-17 NOTE — Assessment & Plan Note (Signed)
Symptomatically doing well. Appears euvolemic on exam. Continue current medications. Follow up with Cardiology as scheduled.

## 2014-01-17 NOTE — Patient Instructions (Signed)
Increase Glipizide to 10mg  twice daily.  Monitor blood sugars and call if blood sugars over 250 or below 70.

## 2014-01-17 NOTE — Assessment & Plan Note (Signed)
Will check A1c with labs today. Will change Glipizide dosing to 10mg  twice daily. Follow up in 3 months or sooner as needed.

## 2014-01-17 NOTE — Progress Notes (Signed)
Pre visit review using our clinic review tool, if applicable. No additional management support is needed unless otherwise documented below in the visit note. 

## 2014-01-23 ENCOUNTER — Other Ambulatory Visit: Payer: Self-pay | Admitting: *Deleted

## 2014-01-23 ENCOUNTER — Telehealth: Payer: Self-pay | Admitting: *Deleted

## 2014-01-23 DIAGNOSIS — E860 Dehydration: Secondary | ICD-10-CM

## 2014-01-23 NOTE — Telephone Encounter (Signed)
Pt is coming in tomorrow for labs what labs and dx?

## 2014-01-23 NOTE — Telephone Encounter (Signed)
BMP, may be coded for dehydration

## 2014-01-24 ENCOUNTER — Encounter: Payer: Self-pay | Admitting: *Deleted

## 2014-01-24 ENCOUNTER — Other Ambulatory Visit (INDEPENDENT_AMBULATORY_CARE_PROVIDER_SITE_OTHER): Payer: Commercial Managed Care - HMO

## 2014-01-24 DIAGNOSIS — E860 Dehydration: Secondary | ICD-10-CM

## 2014-01-24 LAB — BASIC METABOLIC PANEL
BUN: 17 mg/dL (ref 6–23)
CO2: 24 mEq/L (ref 19–32)
Calcium: 9.2 mg/dL (ref 8.4–10.5)
Chloride: 104 mEq/L (ref 96–112)
Creatinine, Ser: 1.3 mg/dL (ref 0.4–1.5)
GFR: 58.07 mL/min — ABNORMAL LOW (ref >60.00)
GLUCOSE: 123 mg/dL — AB (ref 70–99)
POTASSIUM: 4.7 meq/L (ref 3.5–5.1)
Sodium: 136 mEq/L (ref 135–145)

## 2014-01-27 ENCOUNTER — Telehealth: Payer: Self-pay | Admitting: *Deleted

## 2014-01-27 NOTE — Telephone Encounter (Signed)
Called and left message for pt to call to make appt to pick up diabetic shoes.

## 2014-02-05 ENCOUNTER — Ambulatory Visit (INDEPENDENT_AMBULATORY_CARE_PROVIDER_SITE_OTHER): Payer: Commercial Managed Care - HMO | Admitting: Podiatry

## 2014-02-05 DIAGNOSIS — E119 Type 2 diabetes mellitus without complications: Secondary | ICD-10-CM

## 2014-02-05 DIAGNOSIS — Q6650 Congenital pes planus, unspecified foot: Secondary | ICD-10-CM

## 2014-02-05 NOTE — Patient Instructions (Signed)

## 2014-02-05 NOTE — Progress Notes (Signed)
Diabetic shoes were dispensed and instructions were given for the care and use of them. He is to watch for any signs of irritation or rubbing. I will follow-up with him in 1 month just to reevaluate shoes and they're wear.

## 2014-03-10 ENCOUNTER — Ambulatory Visit (INDEPENDENT_AMBULATORY_CARE_PROVIDER_SITE_OTHER): Payer: Commercial Managed Care - HMO | Admitting: Podiatry

## 2014-03-10 DIAGNOSIS — E119 Type 2 diabetes mellitus without complications: Secondary | ICD-10-CM

## 2014-03-10 NOTE — Progress Notes (Signed)
He presents today for a diabetic shoe check stating that they feel great he is very happy with these.  Objective: Ulcers remain palpable bilateral. There is no erythema edema cellulitis areas of drainage or irritation to the feet. Evaluation of the shoes do not demonstrate any ill wear.  Assessment: Diabetes mellitus.  Plan: Evaluation of diabetic shoes. Me as needed.

## 2014-04-21 ENCOUNTER — Other Ambulatory Visit: Payer: Self-pay | Admitting: *Deleted

## 2014-04-21 ENCOUNTER — Ambulatory Visit (INDEPENDENT_AMBULATORY_CARE_PROVIDER_SITE_OTHER): Payer: Commercial Managed Care - HMO | Admitting: Internal Medicine

## 2014-04-21 ENCOUNTER — Encounter: Payer: Self-pay | Admitting: Internal Medicine

## 2014-04-21 VITALS — BP 151/84 | HR 86 | Temp 97.6°F | Ht 63.0 in | Wt 193.5 lb

## 2014-04-21 DIAGNOSIS — E1165 Type 2 diabetes mellitus with hyperglycemia: Secondary | ICD-10-CM

## 2014-04-21 DIAGNOSIS — I2583 Coronary atherosclerosis due to lipid rich plaque: Secondary | ICD-10-CM

## 2014-04-21 DIAGNOSIS — I2510 Atherosclerotic heart disease of native coronary artery without angina pectoris: Secondary | ICD-10-CM

## 2014-04-21 DIAGNOSIS — I5032 Chronic diastolic (congestive) heart failure: Secondary | ICD-10-CM

## 2014-04-21 DIAGNOSIS — E785 Hyperlipidemia, unspecified: Secondary | ICD-10-CM

## 2014-04-21 DIAGNOSIS — I270 Primary pulmonary hypertension: Secondary | ICD-10-CM

## 2014-04-21 DIAGNOSIS — Z23 Encounter for immunization: Secondary | ICD-10-CM

## 2014-04-21 DIAGNOSIS — I272 Pulmonary hypertension, unspecified: Secondary | ICD-10-CM

## 2014-04-21 DIAGNOSIS — IMO0002 Reserved for concepts with insufficient information to code with codable children: Secondary | ICD-10-CM

## 2014-04-21 DIAGNOSIS — L639 Alopecia areata, unspecified: Secondary | ICD-10-CM

## 2014-04-21 MED ORDER — GLUCOSE BLOOD VI STRP: ORAL_STRIP | Status: DC

## 2014-04-21 MED ORDER — GLIPIZIDE ER 10 MG PO TB24: 10.0000 mg | ORAL_TABLET | Freq: Two times a day (BID) | ORAL | Status: DC

## 2014-04-21 MED ORDER — PRAVASTATIN SODIUM 40 MG PO TABS: 40.0000 mg | ORAL_TABLET | Freq: Every day | ORAL | Status: DC

## 2014-04-21 MED ORDER — CARVEDILOL 3.125 MG PO TABS: ORAL_TABLET | ORAL | Status: DC

## 2014-04-21 MED ORDER — TORSEMIDE 10 MG PO TABS: 10.0000 mg | ORAL_TABLET | Freq: Two times a day (BID) | ORAL | Status: DC | PRN

## 2014-04-21 MED ORDER — OMEPRAZOLE 20 MG PO CPDR: 20.0000 mg | DELAYED_RELEASE_CAPSULE | Freq: Two times a day (BID) | ORAL | Status: DC

## 2014-04-21 MED ORDER — POTASSIUM CHLORIDE CRYS ER 20 MEQ PO TBCR: EXTENDED_RELEASE_TABLET | ORAL | Status: DC

## 2014-04-21 NOTE — Patient Instructions (Addendum)
Labs today.  Follow up in 3 months.  We will set up an evaluation with Dr. Nehemiah Massed in dermatology to evaluate hair loss.

## 2014-04-21 NOTE — Assessment & Plan Note (Signed)
Appears euvolemic on exam today. Continue current medications.

## 2014-04-21 NOTE — Assessment & Plan Note (Signed)
Patchy hair loss most consistent with alopecia areata. Will set up dermatology evaluation.

## 2014-04-21 NOTE — Assessment & Plan Note (Signed)
Symptomatically, doing well. Continue current medications.

## 2014-04-21 NOTE — Assessment & Plan Note (Signed)
Mild shortness of breath with exertion only. Last ECHO in 10/2013 showed some improvement in pressures. Will continue to monitor.

## 2014-04-21 NOTE — Assessment & Plan Note (Signed)
Will check A1c with labs. Continue Glipizide. Encouraged low sugar diet.

## 2014-04-21 NOTE — Progress Notes (Signed)
Subjective:    Patient ID: Matthew Miles., male    DOB: Apr 22, 1928, 81 y.o.   MRN: [id number]  HPI 81YO male presents for follow up.  DM - BG have been elevated 140-180s. Trying to limit dried fruit. Compliant with medication.  No recent chest pain. Mild dyspnea with exertion only which is unchanged from previous.   Concerned about areas of hair loss over scalp. Initially, had some itching in these areas a few months ago, then loss hair in circular pattern. No pain or itching now.   Past medical, surgical, family and social history per today's encounter.  Review of Systems  Constitutional: Negative for fever, chills, activity change, appetite change, fatigue and unexpected weight change.  Eyes: Negative for visual disturbance.  Respiratory: Positive for shortness of breath (mild with exertion). Negative for cough and wheezing.   Cardiovascular: Negative for chest pain, palpitations and leg swelling.  Gastrointestinal: Negative for nausea, vomiting, abdominal pain, diarrhea, constipation and abdominal distention.  Genitourinary: Negative for dysuria, urgency and difficulty urinating.  Musculoskeletal: Negative for arthralgias and gait problem.  Skin: Negative for color change and rash.  Hematological: Negative for adenopathy.  Psychiatric/Behavioral: Negative for sleep disturbance and dysphoric mood. The patient is not nervous/anxious.        Objective:    BP 151/84 mmHg  Pulse 86  Temp(Src) 97.6 F (36.4 C) (Oral)  Ht 5\' 3"  (1.6 m)  Wt 193 lb 8 oz (87.771 kg)  BMI 34.29 kg/m2  SpO2 94% Physical Exam  Constitutional: He is oriented to person, place, and time. He appears well-developed and well-nourished. No distress.  HENT:  Head: Normocephalic and atraumatic.  Right Ear: External ear normal.  Left Ear: External ear normal.  Nose: Nose normal.  Mouth/Throat: Oropharynx is clear and moist. No oropharyngeal exudate.  Eyes: Conjunctivae and EOM are normal. Pupils  are equal, round, and reactive to light. Right eye exhibits no discharge. Left eye exhibits no discharge. No scleral icterus.  Neck: Normal range of motion. Neck supple. No tracheal deviation present. No thyromegaly present.  Cardiovascular: Normal rate, regular rhythm and normal heart sounds.  Exam reveals no gallop and no friction rub.   No murmur heard. Pulmonary/Chest: Effort normal and breath sounds normal. No accessory muscle usage. No tachypnea. No respiratory distress. He has no decreased breath sounds. He has no wheezes. He has no rhonchi. He has no rales. He exhibits no tenderness.  Musculoskeletal: Normal range of motion. He exhibits no edema.  Lymphadenopathy:    He has no cervical adenopathy.  Neurological: He is alert and oriented to person, place, and time. No cranial nerve deficit. Coordination normal.  Skin: Skin is warm and dry. No rash noted. He is not diaphoretic. No erythema. No pallor.     Psychiatric: He has a normal mood and affect. His behavior is normal. Judgment and thought content normal.          Assessment & Plan:   Problem List Items Addressed This Visit      Unprioritized   Alopecia areata    Patchy hair loss most consistent with alopecia areata. Will set up dermatology evaluation.    Relevant Orders      Ambulatory referral to Dermatology   Chronic diastolic CHF (congestive heart failure)    Appears euvolemic on exam today. Continue current medications.    Coronary artery disease    Symptomatically, doing well. Continue current medications.    Diabetes mellitus type 2, uncontrolled - Primary  Will check A1c with labs. Continue Glipizide. Encouraged low sugar diet.    Relevant Orders      Comprehensive metabolic panel      Hemoglobin A1c      Lipid panel      Microalbumin / creatinine urine ratio   Pulmonary hypertension    Mild shortness of breath with exertion only. Last ECHO in 10/2013 showed some improvement in pressures. Will continue  to monitor.     Other Visit Diagnoses    Need for vaccination with 13-polyvalent pneumococcal conjugate vaccine        Relevant Orders       Pneumococcal conjugate vaccine 13-valent        Return in about 3 months (around 07/21/2014) for Recheck of Diabetes.

## 2014-04-21 NOTE — Progress Notes (Signed)
Pre visit review using our clinic review tool, if applicable. No additional management support is needed unless otherwise documented below in the visit note. 

## 2014-04-22 LAB — COMPREHENSIVE METABOLIC PANEL
ALT: 19 U/L (ref 0–53)
AST: 19 U/L (ref 0–37)
Albumin: 3.9 g/dL (ref 3.5–5.2)
Alkaline Phosphatase: 75 U/L (ref 39–117)
BILIRUBIN TOTAL: 0.7 mg/dL (ref 0.2–1.2)
BUN: 21 mg/dL (ref 6–23)
CO2: 27 mEq/L (ref 19–32)
Calcium: 8.7 mg/dL (ref 8.4–10.5)
Chloride: 100 mEq/L (ref 96–112)
Creatinine, Ser: 1.2 mg/dL (ref 0.4–1.5)
GFR: 64.49 mL/min (ref >60.00)
GLUCOSE: 257 mg/dL — AB (ref 70–99)
Potassium: 4.3 mEq/L (ref 3.5–5.1)
SODIUM: 133 meq/L — AB (ref 135–145)
TOTAL PROTEIN: 6.2 g/dL (ref 6.0–8.3)

## 2014-04-22 LAB — MICROALBUMIN / CREATININE URINE RATIO
CREATININE, U: 62.9 mg/dL
MICROALB UR: 0.5 mg/dL (ref 0.0–1.9)
Microalb Creat Ratio: 0.8 mg/g (ref 0.0–30.0)

## 2014-04-22 LAB — LIPID PANEL
CHOLESTEROL: 141 mg/dL (ref 0–200)
HDL: 28.6 mg/dL — AB (ref >39.00)
LDL Cholesterol: 74 mg/dL (ref 0–99)
NonHDL: 112.4
TRIGLYCERIDES: 192 mg/dL — AB (ref 0.0–149.0)
Total CHOL/HDL Ratio: 5
VLDL: 38.4 mg/dL (ref 0.0–40.0)

## 2014-04-22 LAB — HEMOGLOBIN A1C: Hgb A1c MFr Bld: 8.1 % — ABNORMAL HIGH (ref 4.6–6.5)

## 2014-04-28 DIAGNOSIS — L638 Other alopecia areata: Secondary | ICD-10-CM | POA: Diagnosis not present

## 2014-04-28 DIAGNOSIS — L659 Nonscarring hair loss, unspecified: Secondary | ICD-10-CM | POA: Diagnosis not present

## 2014-04-28 DIAGNOSIS — J449 Chronic obstructive pulmonary disease, unspecified: Secondary | ICD-10-CM | POA: Diagnosis not present

## 2014-04-28 DIAGNOSIS — I509 Heart failure, unspecified: Secondary | ICD-10-CM | POA: Diagnosis not present

## 2014-04-28 DIAGNOSIS — K859 Acute pancreatitis, unspecified: Secondary | ICD-10-CM | POA: Diagnosis not present

## 2014-06-02 DIAGNOSIS — L638 Other alopecia areata: Secondary | ICD-10-CM | POA: Diagnosis not present

## 2014-06-02 DIAGNOSIS — L659 Nonscarring hair loss, unspecified: Secondary | ICD-10-CM | POA: Diagnosis not present

## 2014-06-05 ENCOUNTER — Encounter: Payer: Self-pay | Admitting: Cardiovascular Disease

## 2014-06-05 ENCOUNTER — Ambulatory Visit (INDEPENDENT_AMBULATORY_CARE_PROVIDER_SITE_OTHER): Payer: Commercial Managed Care - HMO | Admitting: Cardiovascular Disease

## 2014-06-05 VITALS — BP 140/60 | HR 77 | Ht 63.0 in | Wt 191.5 lb

## 2014-06-05 DIAGNOSIS — I2510 Atherosclerotic heart disease of native coronary artery without angina pectoris: Secondary | ICD-10-CM | POA: Diagnosis not present

## 2014-06-05 DIAGNOSIS — I270 Primary pulmonary hypertension: Secondary | ICD-10-CM

## 2014-06-05 DIAGNOSIS — E785 Hyperlipidemia, unspecified: Secondary | ICD-10-CM | POA: Diagnosis not present

## 2014-06-05 DIAGNOSIS — I2583 Coronary atherosclerosis due to lipid rich plaque: Principal | ICD-10-CM

## 2014-06-05 DIAGNOSIS — E1165 Type 2 diabetes mellitus with hyperglycemia: Secondary | ICD-10-CM

## 2014-06-05 DIAGNOSIS — R011 Cardiac murmur, unspecified: Secondary | ICD-10-CM

## 2014-06-05 DIAGNOSIS — IMO0002 Reserved for concepts with insufficient information to code with codable children: Secondary | ICD-10-CM

## 2014-06-05 DIAGNOSIS — I5032 Chronic diastolic (congestive) heart failure: Secondary | ICD-10-CM | POA: Diagnosis not present

## 2014-06-05 DIAGNOSIS — I272 Pulmonary hypertension, unspecified: Secondary | ICD-10-CM

## 2014-06-05 DIAGNOSIS — J449 Chronic obstructive pulmonary disease, unspecified: Secondary | ICD-10-CM | POA: Diagnosis not present

## 2014-06-05 NOTE — Assessment & Plan Note (Signed)
Currently with no symptoms of angina. No further workup at this time. Continue current medication regimen. 

## 2014-06-05 NOTE — Assessment & Plan Note (Signed)
Murmur consistent with aortic valve sclerosis. No further testing needed

## 2014-06-05 NOTE — Progress Notes (Signed)
Patient ID: Matthew Miles., male    DOB: [birth year], 81 y.o.   MRN: [id number]  HPI Comments: Matthew Miles is a 81  yo-old gentleman with history of coronary artery disease (occluded LAD), moderate pulmonary hypertension in early 2013, long history of smoking for 60 years, history of bronchitis requiring several courses of antibiotics at the end of 2012, prednisone and nebulizers with admission to the hospital in December for hematemesis and gastric ulcer seen on EGD, found to have COPD and oxygenation down to 83% with ambulation, nonsustained VT per the notes, acute renal failure who presents for followup  Of his coronary artery disease  He reports that he is active. No regular exercise program but does do lots of walking. He is been occasionally eating the wrong foods. Hemoglobin A1c ordered and 8. Reports weight is relatively unchanged. Denies any chest pain or shortness of breath concerning for angina. He does have chronic mild baseline shortness of breath and paces himself Continues to take torsemide 10 mg in the morning, 5 mg in the evening.  Trivial leg edema Chronic low back pain Stable renal function may 2015  EKG on today's visit shows normal sinus rhythm with no significant ST or T-wave changes  Other past medical history  left and right heart cath done in February 2013 showing pulmonary hypertension, stable severe coronary artery disease  Normal ejection fraction  Right heart catheterization showed significant fluid overload with wedge pressure of 24, moderate pulmonary hypertension. Catheterization showed occluded LAD which was old, no other significant stenoses requiring intervention.     Allergies  Allergen Reactions  . Sulfa Antibiotics     GI upset  . Tradjenta [Linagliptin]     Hair loss    Outpatient Encounter Prescriptions as of 06/05/2014  Medication Sig  . aspirin 81 MG tablet Take 81 mg by mouth daily.  . Blood Glucose Monitoring Suppl (ONE TOUCH ULTRA  SYSTEM KIT) W/DEVICE KIT 1 kit by Does not apply route once.  . carvedilol (COREG) 3.125 MG tablet TAKE 1 TABLET BY MOUTH 2 TIMES A DAY  . Cyanocobalamin (B-12) 100 MCG TABS Take by mouth daily.  . cyclobenzaprine (FLEXERIL) 5 MG tablet Take 1 tablet (5 mg total) by mouth 3 (three) times daily as needed for muscle spasms.  . fluorometholone (EFLONE) 0.1 % ophthalmic suspension 1 drop 4 (four) times daily.  . fluorometholone (FML) 0.1 % ophthalmic suspension   . gentamicin ointment (GARAMYCIN) 0.1 % Apply 1 application topically 3 (three) times daily.  Marland Kitchen glipiZIDE (GLUCOTROL XL) 10 MG 24 hr tablet Take 1 tablet (10 mg total) by mouth 2 (two) times daily.  Marland Kitchen glucose blood test strip One touch ultra test strips. Use to check blood sugar two times a day. Dx. 250.00  . nitroGLYCERIN (NITROSTAT) 0.4 MG SL tablet Place 1 tablet (0.4 mg total) under the tongue every 5 (five) minutes as needed.  . nystatin-triamcinolone (MYCOLOG II) cream   . omeprazole (PRILOSEC) 20 MG capsule Take 1 capsule (20 mg total) by mouth 2 (two) times daily. Twice daily before meals  . potassium chloride SA (KLOR-CON M20) 20 MEQ tablet TAKE 1 TABLET BY MOUTH EVERY DAY  . pravastatin (PRAVACHOL) 40 MG tablet Take 1 tablet (40 mg total) by mouth daily.  Marland Kitchen torsemide (DEMADEX) 10 MG tablet Take 1 tablet (10 mg total) by mouth 2 (two) times daily as needed.    Past Medical History  Diagnosis Date  . COPD (chronic obstructive pulmonary disease)   .  Smoker   . Mild hypertension   . Hypercholesterolemia   . Upper GI bleed 1980  . Hematemesis/vomiting blood 04/10/11  . Prostate cancer   . Prostate cancer     radiation therapy   . Coronary artery disease   . Heart murmur   . Arthritis   . Chicken pox   . Emphysema of lung   . GERD (gastroesophageal reflux disease)   . Ulcer   . Allergy   . Colon polyps   . Cholecystitis   . Pancreatitis   . CHF (congestive heart failure)     Past Surgical History  Procedure  Laterality Date  . Appendectomy    . Stomach surgery      bleeding ulcers, followed by Dr. [name]  . Cardiac catheterization  May 2002 and Feb 2013    [facility]; no stents   . Circumcision    . Sp cholecystomy      Social History  reports that he quit smoking about 3 years ago. His smoking use included Cigarettes. He has a 65 pack-year smoking history. He has quit using smokeless tobacco. His smokeless tobacco use included Chew. He reports that he does not drink alcohol or use illicit drugs.  Family History family history includes Emphysema in his sister; Liver cancer in his brother; Prostate cancer in his brother and father.   Review of Systems  Constitutional: Negative.   HENT: Negative.   Respiratory: Positive for shortness of breath.   Cardiovascular: Positive for leg swelling.  Gastrointestinal: Negative.   Musculoskeletal: Negative.   Neurological: Negative.   Hematological: Negative.   Psychiatric/Behavioral: Negative.   All other systems reviewed and are negative.  BP 140/60 mmHg  Pulse 77  Ht 5' 3"  (1.6 m)  Wt 191 lb 8 oz (86.864 kg)  BMI 33.93 kg/m2  Physical Exam  Constitutional: He is oriented to person, place, and time. He appears well-developed and well-nourished.  HENT:  Head: Normocephalic.  Nose: Nose normal.  Mouth/Throat: Oropharynx is clear and moist.  Eyes: Conjunctivae are normal. Pupils are equal, round, and reactive to light.  Neck: Normal range of motion. Neck supple. No JVD present.  Cardiovascular: Normal rate, regular rhythm, S1 normal, S2 normal and intact distal pulses.  Frequent extrasystoles are present. Exam reveals no gallop and no friction rub.   Murmur heard.  Crescendo systolic murmur is present with a grade of 2/6  Trace edema around the ankles  Pulmonary/Chest: Effort normal and breath sounds normal. No respiratory distress. He has no wheezes. He has no rales. He exhibits no tenderness.  Abdominal: Soft. Bowel sounds are normal. He  exhibits no distension. There is no tenderness.  Musculoskeletal: Normal range of motion. He exhibits edema. He exhibits no tenderness.  Lymphadenopathy:    He has no cervical adenopathy.  Neurological: He is alert and oriented to person, place, and time. Coordination normal.  Skin: Skin is warm and dry. No rash noted. No erythema.  Psychiatric: He has a normal mood and affect. His behavior is normal. Judgment and thought content normal.      Assessment and Plan   Nursing note and vitals reviewed.

## 2014-06-05 NOTE — Assessment & Plan Note (Signed)
We have stressed the importance of weight loss, avoiding starchy foods

## 2014-06-05 NOTE — Assessment & Plan Note (Signed)
Stable chronic mild shortness of breath. Will likely breath better when he runs on the dry side. No further testing at this time

## 2014-06-05 NOTE — Assessment & Plan Note (Signed)
He appears relatively euvolemic on today's visit. Recommended he move the evening dose of torsemide up to the morning and take 15 mg in the morning

## 2014-06-05 NOTE — Assessment & Plan Note (Signed)
Stable chronic mild shortness of breath.

## 2014-06-05 NOTE — Patient Instructions (Signed)
You are doing well.  Please move the evening torsemide (water pill) to the morning with the other one (1 1/2 pills in the Am on Monday through Saturday) Skip water pill on Sundays  Cut back on the fruit, potatos for the diabetes. Number has run up mildly. This can affect the heart blockages.  Please call us if you have new issues that need to be addressed before your next appt.  Your physician wants you to follow-up in: 6 months.  You will receive a reminder letter in the mail two months in advance. If you don't receive a letter, please call our office to schedule the follow-up appointment.

## 2014-06-05 NOTE — Assessment & Plan Note (Signed)
Cholesterol is at goal on the current lipid regimen. No changes to the medications were made. Recommended weight loss

## 2014-07-03 DIAGNOSIS — L659 Nonscarring hair loss, unspecified: Secondary | ICD-10-CM | POA: Diagnosis not present

## 2014-07-03 DIAGNOSIS — L638 Other alopecia areata: Secondary | ICD-10-CM | POA: Diagnosis not present

## 2014-07-14 ENCOUNTER — Telehealth: Payer: Self-pay | Admitting: Internal Medicine

## 2014-07-14 NOTE — Telephone Encounter (Signed)
Pt dropped off a copy of denial of payment letter from an appt with [name]. Copy of letter in Dr. Thomes Dinning box. Pt has [employer]. Please place referral and advise Helen/msn

## 2014-07-14 NOTE — Telephone Encounter (Signed)
We will need to give this to [place].

## 2014-07-14 NOTE — Telephone Encounter (Signed)
Please see below request  

## 2014-07-15 NOTE — Telephone Encounter (Signed)
Gave to nancy

## 2014-07-21 ENCOUNTER — Encounter: Payer: Self-pay | Admitting: Internal Medicine

## 2014-07-21 ENCOUNTER — Ambulatory Visit (INDEPENDENT_AMBULATORY_CARE_PROVIDER_SITE_OTHER): Payer: Commercial Managed Care - HMO | Admitting: Internal Medicine

## 2014-07-21 VITALS — BP 153/65 | HR 78 | Temp 97.8°F | Ht 63.0 in | Wt 195.2 lb

## 2014-07-21 DIAGNOSIS — E1165 Type 2 diabetes mellitus with hyperglycemia: Secondary | ICD-10-CM

## 2014-07-21 DIAGNOSIS — I2510 Atherosclerotic heart disease of native coronary artery without angina pectoris: Secondary | ICD-10-CM

## 2014-07-21 DIAGNOSIS — I10 Essential (primary) hypertension: Secondary | ICD-10-CM | POA: Diagnosis not present

## 2014-07-21 DIAGNOSIS — IMO0002 Reserved for concepts with insufficient information to code with codable children: Secondary | ICD-10-CM

## 2014-07-21 DIAGNOSIS — E785 Hyperlipidemia, unspecified: Secondary | ICD-10-CM

## 2014-07-21 DIAGNOSIS — I2583 Coronary atherosclerosis due to lipid rich plaque: Secondary | ICD-10-CM

## 2014-07-21 LAB — LIPID PANEL
CHOLESTEROL: 133 mg/dL (ref 0–200)
HDL: 32.6 mg/dL — ABNORMAL LOW (ref >39.00)
NonHDL: 100.4
TRIGLYCERIDES: 251 mg/dL — AB (ref 0.0–149.0)
Total CHOL/HDL Ratio: 4
VLDL: 50.2 mg/dL — AB (ref 0.0–40.0)

## 2014-07-21 LAB — COMPREHENSIVE METABOLIC PANEL
ALT: 17 U/L (ref 0–53)
AST: 13 U/L (ref 0–37)
Albumin: 4 g/dL (ref 3.5–5.2)
Alkaline Phosphatase: 81 U/L (ref 39–117)
BUN: 26 mg/dL — AB (ref 6–23)
CO2: 29 meq/L (ref 19–32)
CREATININE: 1.33 mg/dL (ref 0.40–1.50)
Calcium: 9.5 mg/dL (ref 8.4–10.5)
Chloride: 101 mEq/L (ref 96–112)
GFR: 54.5 mL/min — ABNORMAL LOW (ref >60.00)
Glucose, Bld: 174 mg/dL — ABNORMAL HIGH (ref 70–99)
Potassium: 4.5 mEq/L (ref 3.5–5.1)
Sodium: 136 mEq/L (ref 135–145)
Total Bilirubin: 0.5 mg/dL (ref 0.2–1.2)
Total Protein: 6.4 g/dL (ref 6.0–8.3)

## 2014-07-21 LAB — MICROALBUMIN / CREATININE URINE RATIO
Creatinine,U: 66.9 mg/dL
MICROALB/CREAT RATIO: 0.4 mg/g (ref 0.0–30.0)
Microalb, Ur: 0.3 mg/dL (ref 0.0–1.9)

## 2014-07-21 LAB — HEMOGLOBIN A1C: Hgb A1c MFr Bld: 8.1 % — ABNORMAL HIGH (ref 4.6–6.5)

## 2014-07-21 LAB — LDL CHOLESTEROL, DIRECT: Direct LDL: 79 mg/dL

## 2014-07-21 NOTE — Progress Notes (Signed)
Pre visit review using our clinic review tool, if applicable. No additional management support is needed unless otherwise documented below in the visit note. 

## 2014-07-21 NOTE — Assessment & Plan Note (Addendum)
Will check A1c with labs today. Encouraged low sugar diet. Continue Glipizide.

## 2014-07-21 NOTE — Patient Instructions (Signed)
Diabetes Mellitus and Food It is important for you to manage your blood sugar (glucose) level. Your blood glucose level can be greatly affected by what you eat. Eating healthier foods in the appropriate amounts throughout the day at about the same time each day will help you control your blood glucose level. It can also help slow or prevent worsening of your diabetes mellitus. Healthy eating may even help you improve the level of your blood pressure and reach or maintain a healthy weight.  HOW CAN FOOD AFFECT ME? Carbohydrates Carbohydrates affect your blood glucose level more than any other type of food. Your dietitian will help you determine how many carbohydrates to eat at each meal and teach you how to count carbohydrates. Counting carbohydrates is important to keep your blood glucose at a healthy level, especially if you are using insulin or taking certain medicines for diabetes mellitus. Alcohol Alcohol can cause sudden decreases in blood glucose (hypoglycemia), especially if you use insulin or take certain medicines for diabetes mellitus. Hypoglycemia can be a life-threatening condition. Symptoms of hypoglycemia (sleepiness, dizziness, and disorientation) are similar to symptoms of having too much alcohol.  If your health care provider has given you approval to drink alcohol, do so in moderation and use the following guidelines:  Women should not have more than one drink per day, and men should not have more than two drinks per day. One drink is equal to:  12 oz of beer.  5 oz of wine.  1 oz of hard liquor.  Do not drink on an empty stomach.  Keep yourself hydrated. Have water, diet soda, or unsweetened iced tea.  Regular soda, juice, and other mixers might contain a lot of carbohydrates and should be counted. WHAT FOODS ARE NOT RECOMMENDED? As you make food choices, it is important to remember that all foods are not the same. Some foods have fewer nutrients per serving than other  foods, even though they might have the same number of calories or carbohydrates. It is difficult to get your body what it needs when you eat foods with fewer nutrients. Examples of foods that you should avoid that are high in calories and carbohydrates but low in nutrients include:  Trans fats (most processed foods list trans fats on the Nutrition Facts label).  Regular soda.  Juice.  Candy.  Sweets, such as cake, pie, doughnuts, and cookies.  Fried foods. WHAT FOODS CAN I EAT? Have nutrient-rich foods, which will nourish your body and keep you healthy. The food you should eat also will depend on several factors, including:  The calories you need.  The medicines you take.  Your weight.  Your blood glucose level.  Your blood pressure level.  Your cholesterol level. You also should eat a variety of foods, including:  Protein, such as meat, poultry, fish, tofu, nuts, and seeds (lean animal proteins are best).  Fruits.  Vegetables.  Dairy products, such as milk, cheese, and yogurt (low fat is best).  Breads, grains, pasta, cereal, rice, and beans.  Fats such as olive oil, trans fat-free margarine, canola oil, avocado, and olives. DOES EVERYONE WITH DIABETES MELLITUS HAVE THE SAME MEAL PLAN? Because every person with diabetes mellitus is different, there is not one meal plan that works for everyone. It is very important that you meet with a dietitian who will help you create a meal plan that is just right for you. Document Released: 12/23/2004 Document Revised: 04/02/2013 Document Reviewed: 02/22/2013 Exit[employer] Patient Information 2015 [employer]. This   information is not intended to replace advice given to you by your health care provider. Make sure you discuss any questions you have with your health care provider.  

## 2014-07-21 NOTE — Assessment & Plan Note (Signed)
Symptomatically doing well. Will continue current medications. Lipids with labs today.

## 2014-07-21 NOTE — Assessment & Plan Note (Signed)
Will check lipids and LFTs with labs today. 

## 2014-07-21 NOTE — Assessment & Plan Note (Signed)
BP Readings from Last 3 Encounters:  07/21/14 153/65  06/05/14 140/60  04/21/14 151/84   BP slightly elevated today, but better controlled in past. Will continue current medications. Renal function with labs.

## 2014-07-21 NOTE — Progress Notes (Signed)
Subjective:    Patient ID: Matthew Blank., male    DOB: 1929-03-09, 81 y.o.   MRN: [id number]  HPI  81YO male presents for follow up.  DM - Trying to eat less sugar. Has cut back on potatoes and bread. This morning BG near 150. No blood sugars under 100.  Feeling well. No chest pain, dyspnea. Hasn't recently used NTG.  Past medical, surgical, family and social history per today's encounter.  Review of Systems  Constitutional: Negative for fever, chills, activity change, appetite change, fatigue and unexpected weight change.  Eyes: Negative for visual disturbance.  Respiratory: Negative for cough and shortness of breath.   Cardiovascular: Negative for chest pain, palpitations and leg swelling.  Gastrointestinal: Negative for abdominal pain and abdominal distention.  Genitourinary: Negative for dysuria, urgency and difficulty urinating.  Musculoskeletal: Negative for arthralgias and gait problem.  Skin: Negative for color change and rash.  Hematological: Negative for adenopathy.  Psychiatric/Behavioral: Negative for sleep disturbance and dysphoric mood. The patient is not nervous/anxious.        Objective:    BP 153/65 mmHg  Pulse 78  Temp(Src) 97.8 F (36.6 C) (Oral)  Ht 5\' 3"  (1.6 m)  Wt 195 lb 4 oz (88.565 kg)  BMI 34.60 kg/m2  SpO2 95% Physical Exam  Constitutional: He is oriented to person, place, and time. He appears well-developed and well-nourished. No distress.  HENT:  Head: Normocephalic and atraumatic.  Right Ear: External ear normal.  Left Ear: External ear normal.  Nose: Nose normal.  Mouth/Throat: Oropharynx is clear and moist. No oropharyngeal exudate.  Eyes: Conjunctivae and EOM are normal. Pupils are equal, round, and reactive to light. Right eye exhibits no discharge. Left eye exhibits no discharge. No scleral icterus.  Neck: Normal range of motion. Neck supple. No tracheal deviation present. No thyromegaly present.  Cardiovascular: Normal rate,  regular rhythm and normal heart sounds.  Exam reveals no gallop and no friction rub.   No murmur heard. Pulmonary/Chest: Effort normal and breath sounds normal. No accessory muscle usage. No tachypnea. No respiratory distress. He has no decreased breath sounds. He has no wheezes. He has no rhonchi. He has no rales. He exhibits no tenderness.  Musculoskeletal: Normal range of motion. He exhibits no edema.  Lymphadenopathy:    He has no cervical adenopathy.  Neurological: He is alert and oriented to person, place, and time. No cranial nerve deficit. Coordination normal.  Skin: Skin is warm and dry. No rash noted. He is not diaphoretic. No erythema. No pallor.  Psychiatric: He has a normal mood and affect. His behavior is normal. Judgment and thought content normal.          Assessment & Plan:   Problem List Items Addressed This Visit      Unprioritized   Coronary artery disease    Symptomatically doing well. Will continue current medications. Lipids with labs today.      Diabetes mellitus type 2, uncontrolled - Primary    Will check A1c with labs today. Encouraged low sugar diet. Continue Glipizide.      Relevant Orders   Comprehensive metabolic panel   Hemoglobin A1c   Lipid panel   Microalbumin / creatinine urine ratio   Essential hypertension    BP Readings from Last 3 Encounters:  07/21/14 153/65  06/05/14 140/60  04/21/14 151/84   BP slightly elevated today, but better controlled in past. Will continue current medications. Renal function with labs.      Hyperlipidemia  Will check lipids and LFTs with labs today.          Return in about 3 months (around 10/20/2014) for Recheck of Diabetes.

## 2014-07-22 ENCOUNTER — Encounter: Payer: Self-pay | Admitting: *Deleted

## 2014-08-01 NOTE — Consult Note (Signed)
CC: gallstone pancreatitis.  Pt seen by pulmonary and is a moderate risk for surgery.  Spoke with Dr.Lundquist who plans for surgery today.  I will sign off, reconsult if needed.  Electronic Signatures: Manya Silvas (MD)  (Signed on 13-Apr-14 09:53)  Authored  Last Updated: 13-Apr-14 09:53 by Manya Silvas (MD)

## 2014-08-01 NOTE — Consult Note (Signed)
Chief Complaint:  Subjective/Chief Complaint No abd pain or nausea. Lipase back to normal. LFT improving. U/S neg. WBC normal now.   VITAL SIGNS/ANCILLARY NOTES: **Vital Signs.:   11-Apr-14 06:40  Vital Signs Type Routine  Temperature Temperature (F) 98.6  Celsius 37  Temperature Source oral  Pulse Pulse 74  Respirations Respirations 18  Systolic BP Systolic BP 427  Diastolic BP (mmHg) Diastolic BP (mmHg) 57  Mean BP 74  Pulse Ox % Pulse Ox % 94  Pulse Ox Activity Level  At rest  Oxygen Delivery Room Air/ 21 %   Brief Assessment:  Cardiac Regular   Respiratory clear BS   Gastrointestinal Normal   Lab Results: Hepatic:  11-Apr-14 04:20   Bilirubin, Total  3.2  Alkaline Phosphatase  164  SGPT (ALT)  487  SGOT (AST)  247  Total Protein, Serum  5.8  Albumin, Serum  3.0  Routine Chem:  11-Apr-14 04:20   Glucose, Serum  139  BUN  22  Creatinine (comp) 1.25  Sodium, Serum 143  Potassium, Serum 4.8  Chloride, Serum  111  CO2, Serum 26  Calcium (Total), Serum  8.4  Osmolality (calc) 291  eGFR (African American) >60  eGFR (Non-African American)  54 (eGFR values <11m/min/1.73 m2 may be an indication of chronic kidney disease (CKD). Calculated eGFR is useful in patients with stable renal function. The eGFR calculation will not be reliable in acutely ill patients when serum creatinine is changing rapidly. It is not useful in  patients on dialysis. The eGFR calculation may not be applicable to patients at the low and high extremes of body sizes, pregnant women, and vegetarians.)  Anion Gap  6  Lipase  63 (Result(s) reported on 20 Jul 2012 at 05:59AM.)  Routine Hem:  11-Apr-14 04:20   WBC (CBC) 9.6 (Result(s) reported on 20 Jul 2012 at 06:10AM.)   Radiology Results: [place]    10-Apr-14 12:33, [place]  [place]   REASON FOR EXAM:    elevated transminaseds, bilirubin, pancrteatitis, r/o   CBD dilatation  COMMENTS:   Body Site: GB  and Fossa, CBD, Head of Pancreas    PROCEDURE: [place] - [place] SURVEY  - Jul 19 2012 12:33PM     RESULT: History: Elevated bilirubin. Pancreatitis.    Comparison Study: CT abdomen 07/18/2012.     Findings: No focal hepatic abnormality identified. Pancreas not   visualized. Gallstones are present. Negative Murphy's sign. Gallbladder   wall thickness 2.2 mm, the common bile duct caliber 5.2 mm.    IMPRESSION:   1. Gallstones.  2. No biliary distention. Pancreas not visualized due to overlying bowel   gas.        Verified By: TOsa Craver M.D., MD   Assessment/Plan:  Assessment/Plan:  Assessment Gallstone pancreatitis. Pancreatitis has resolved.   Plan Continue to moniter LFT. IF LFT goes up, order MRCP. GB surgery being considered. Can start clear liquid diet. Dr. EVira Agarwill cover this weekend. Thanks.   Electronic Signatures: OVerdie Shire(MD)  (Signed 11-Apr-14 08:20)  Authored: Chief Complaint, VITAL SIGNS/ANCILLARY NOTES, Brief Assessment, Lab Results, Radiology Results, Assessment/Plan   Last Updated: 11-Apr-14 08:20 by OVerdie Shire(MD)

## 2014-08-01 NOTE — Consult Note (Signed)
Chief Complaint:  Subjective/Chief Complaint Asked to see patient for possible ERCP. Abnormal cholangiogram yest. Sore from surgery. LFT better. INR normal.   VITAL SIGNS/ANCILLARY NOTES: **Vital Signs.:   14-Apr-14 05:08  Vital Signs Type Routine  Temperature Temperature (F) 98.1  Celsius 36.7  Temperature Source oral  Pulse Pulse 100  Respirations Respirations 18  Systolic BP Systolic BP 737  Diastolic BP (mmHg) Diastolic BP (mmHg) 82  Mean BP 106  Pulse Ox % Pulse Ox % 92  Pulse Ox Activity Level  At rest  Oxygen Delivery 2L   Brief Assessment:  Cardiac Regular   Respiratory clear BS   Gastrointestinal tender in surgical area   Lab Results: Hepatic:  14-Apr-14 10:28   Bilirubin, Total  1.2  Alkaline Phosphatase  198  SGPT (ALT)  167  SGOT (AST)  54  Total Protein, Serum  6.0  Albumin, Serum  3.1  Routine Chem:  14-Apr-14 10:28   Glucose, Serum  194  BUN 14  Creatinine (comp) 1.15  Sodium, Serum 136  Potassium, Serum 4.4  Chloride, Serum 105  CO2, Serum 22  Calcium (Total), Serum  8.3  Osmolality (calc) 278  Anion Gap 9 (Result(s) reported on 23 Jul 2012 at 10:48AM.)  Routine Coag:  14-Apr-14 10:28   Prothrombin 14.1  INR 1.1 (INR reference interval applies to patients on anticoagulant therapy. A single INR therapeutic range for coumarins is not optimal for all indications; however, the suggested range for most indications is 2.0 - 3.0. Exceptions to the INR Reference Range may include: Prosthetic heart valves, acute myocardial infarction, prevention of myocardial infarction, and combinations of aspirin and anticoagulant. The need for a higher or lower target INR must be assessed individually. Reference: The Pharmacology and Management of the Vitamin K  antagonists: the seventh ACCP Conference on Antithrombotic and Thrombolytic Therapy. TGGYI.9485 Sept:126 (3suppl): [id number]. A HCT value >55% may artifactually increase the PT.  In one study,  the  increase was an average of 25%. Reference:  "Effect on Routine and Special Coagulation Testing Values of Citrate Anticoagulant Adjustment in Patients with High HCT Values." American Journal of Clinical Pathology 2006;126:400-405.)  Routine Hem:  14-Apr-14 10:28   WBC (CBC)  11.7  RBC (CBC)  4.06  Hemoglobin (CBC)  11.6  Hematocrit (CBC)  35.8  Platelet Count (CBC)  122  MCV 88  MCH 28.7  MCHC 32.5  RDW 14.4  Neutrophil % 88.7  Lymphocyte % 4.9  Monocyte % 5.9  Eosinophil % 0.2  Basophil % 0.3  Neutrophil #  10.4  Lymphocyte #  0.6  Monocyte # 0.7  Eosinophil # 0.0  Basophil # 0.0 (Result(s) reported on 23 Jul 2012 at 10:48AM.)   Radiology Results: XRay:    13-Apr-14 18:27, Cholangiogram Operative  Cholangiogram Operative   REASON FOR EXAM:    Cholelithiasis/lap chole with grams  COMMENTS:       PROCEDURE: DXR - DXR CHOLANGIOGRAM OP (INITIAL)  - Jul 22 2012  6:27PM     RESULT: Comparison: CT of the abdomen and pelvis 07/18/2012    Findings:  There is opacification of the intrahepatic biliary ducts, extra hepatic   bile duct, cystic duct stump, and duodenum. There is focal narrowing of   the inferior common bile duct over a moderate-sized length. No   intraluminal filling defect identified. The remainder of the   interpretation is deferred to the performing clinician.    IMPRESSION:   There is a moderate length segment of narrowing of the  inferior common   bile duct. This could represent a benign or malignant stricture. ERCP   with consideration for brushings is suggested.    This was called to Dr. Marlyce Huge at [place] hours 07/22/2012.      Dictation Site: 8        Verified By: Gregor Hams, M.D., MD   Assessment/Plan:  Assessment/Plan:  Assessment Biliary stricture?   Plan Discussed ERCP with patient and daughter. NPO since AM. Had clears this AM early. Risks, incl recurrent pancreatitis, bleeding, perforation, breathing/cardiac issues all  discussed. Plan this afternoon. THanks.   Electronic Signatures: Verdie Shire (MD)  (Signed 14-Apr-14 12:27)  Authored: Chief Complaint, VITAL SIGNS/ANCILLARY NOTES, Brief Assessment, Lab Results, Radiology Results, Assessment/Plan   Last Updated: 14-Apr-14 12:27 by Verdie Shire (MD)

## 2014-08-01 NOTE — H&P (Signed)
DATE OF BIRTH:  [birth year]  DATE OF ADMISSION:  07/18/2012  PRIMARY CARE PHYSICIAN:  Dr. Calla Kicks  HISTORY OF PRESENT ILLNESS:  The patient is an 81 year old Caucasian male with past medical history significant for history of COPD, history of tobacco abuse, who stopped smoking now, who presented to the hospital with complaints of abdominal pain. He is not able to provide much history, but he tells me that around 10 a.m., he started having abdominal pain. The pain was  around the umbilical area radiating to the back. The pain was heavy, felt as if he needs to go to the bathroom. He was nauseated; however, did not have vomiting at that time. However, he had more vomiting later on. He denies any hematemesis or rectal bleeding. Denies any diarrhea or constipation. On arrival to the hospital, he also was complaining of chest pain. The pain was together with his abdominal pain. He was also getting progressively short of breath. The shortness of breath started a few hours ago. On arrival to the Emergency Room, he was noted to be very wheezy, short of breath. He required to be on BiPAP for hypoxia. He was given Decadron as well as DuoNeb, and his oxygen saturation somewhat improved. In the Emergency Room, he was noted to be hypotensive, with systolic blood pressure in 80s over 50s. Now, he is  even below that, 75 over 61s. Hospitalist services were contacted for admission.   PAST MEDICAL HISTORY:  Significant for history of recent circumcision for phimosis by Dr. Jacqlyn Larsen on 06/19/2012, history of peptic ulcer disease, hematemesis on January 2013 admission, COPD with chronic respiratory failure, on oxygen at home, history of  tachycardia, nonsustained V tach, history of acute renal failure, tobacco abuse, quit smoking approximately 1-1/2 years ago, history of generalized weakness, thrombocytopenia, hypertension, hyperlipidemia, history of GI bleed, operated in 1980s, history of appendectomy.  SOCIAL HISTORY:  The  patient used to smoke from age of 34 until age of 70. Used to smoke at least 1 pack of cigarettes a day. No history of alcohol or drug abuse. 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 Lives at home alone. The patient's wife lives in a skilled nursing facility.  MEDICATION LIST :  1.  Albuterol 2.5 in 3 mL inhalation solution every 6 hours as needed.  2.  Aspirin 81 mg p.o. daily. 3.  Glipizide 5 mg p.o. daily.  4.  Klor-Con 20 mg p.o. once daily.  5.  Nitrostat 0.4 mg sublingually every 5 minutes as needed.  6.  Omeprazole 10 mg p.o. twice daily.  7.  Pravastatin 40 mg p.o. at bedtime.  8.  Proventil 2 puffs every 4 hours as needed.  9.  Torsemide 10 mg  p.o. 1 to 2 tablets a day.   ALLERGIES:  No known drug allergies. However, patient's family stated in the past that he  does not tolerate sulfa medications, while he usually gets nauseated and vomits.   REVIEW OF SYSTEMS:  Difficult to obtain, as the patient is a little confused. He admits to having some pain at home. Denies any significant pain now. Admits of having some chest pain together with abdominal pain. Admits of having shortness of breath as well as intermittent nausea and vomiting today. Denies any fevers, chills, sweating, weakness. EYES:  Denies blurry vision, double vision, glaucoma or cataracts.  EARS, NOSE, THROAT:  Denies any tinnitus, allergies, epistaxis, sinus tenderness or difficulty swallowing.  RESPIRATORY:  (Dictation Anomaly) cough, wheezes, asthma, COPD.  CARDIOVASCULAR:  Denies any chest pain,  orthopnea, edema, arrhythmias, palpitations or syncope.  GASTROINTESTINAL: Denies any abdominal pain, except as mentioned above. No rectal bleeding. No change in bowel habits. Admits of having nausea, vomiting. Denies any hematemesis.  GENITOURINARY: Denies dysuria, hematuria, frequency, or incontinence.  ENDOCRINOLOGY: Denies polydipsia, nocturia, thyroid problems, heat or cold intolerance, or thirst.  HEMATOLOGIC:  Denies anemia, easy bruising,  bleeding or swollen glands.  SKIN:  Denies any acne, rashes or moles.  MUSCULOSKELETAL: Denies arthritis, cramps, swollen legs. NEUROLOGIC:  Denies any numbness, epilepsy or tremor.   PSYCHIATRIC: Denies anxiety, insomnia or depression.   PHYSICAL EXAMINATION: VITAL SIGNS: On arrival to the hospital, the patient's vital signs were temperature 98.8, pulse 100, respiratory rate was 32, blood pressure 140/65, saturation 100% on oxygen therapy.  GENERAL: This is a well-developed, well-nourished Caucasian male, somewhat short of breath, tachypneic sitting on the stretcher.  HEENT: His pupils are equal and reactive to light. Extraocular movements intact. No icterus or conjunctivitis. Has normal hearing. No pharyngeal erythema. Mucosa is moist.  NECK:  No masses. Supple, nontender. Thyroid not enlarged. No adenopathy. No JVD or carotid bruits bilaterally. Full range of motion.  LUNGS: Crackles bilaterally in all fields, as well as rhonchi. Somewhat diminished breath sounds, but no wheezing, except as in upper airways. Patient does have labored inspirations as well as increased effort to breathe intermittently, especially whenever he moves around, in mild respiratory distress.  CARDIOVASCULAR: S1, S2 appreciated. Rythm is regular No rubs were noted.  Chest is nontender to palpation. There are 1+ pedal pulses. The patient does have trace lower extremity edema. No calf tenderness or cyanosis.  ABDOMEN: Soft, nontender. No tenderness was able to elicit anywhere in the right upper quadrant  periumbilically or suprapubically. No hepatosplenomegaly or masses were noted.  RECTAL: Deferred.  MUSCULOSKELETAL:  Able to move all extremities. No cyanosis, degenerative joint disease or kyphosis. Gait is not tested.  SKIN:  Did not reveal rashes, lesions, erythema, nodularity or induration. It was warm and dry to palpation.  LYMPHATIC: No adenopathy in the cervical region.  NEUROLOGICAL: Cranial nerves grossly intact.  Sensory is intact. No dysarthria or aphasia. The patient is alert and oriented to person and place, somewhat confused intermittently. Cooperative. Memory is impaired. No significant depression, agitation noted.   LABS:  Glucose 182.  Beta-type natriuretic peptide was 618, BUN and creatinine were 26 and 1.46, estimated GFR for non-African American would be 44, lipase elevated at more than 3000. Patient's liver enzymes showed total bilirubin elevated to 0.6, alkaline phosphatase 182, AST 694, ALT 385. Troponin less than 0.02. White blood cell count elevated to 14.5, hemoglobin 13.6, platelet count 193, absolute neutrophil count is not checked. Pro time elevated at 14.9, INR 1.2. Urinalysis: Yellow clear urine, negative for glucose, bilirubin or ketones, specific gravity 1.015, pH was 5.0, negative for blood, protein, nitrites, trace leukocyte esterase, 2 red blood cells, 3 white blood cells, no bacteria, less than one epithelial cell was noted. ABGs were done on 38% FiO2, showed pH of 7.40, pCO2 of 34, pO2 of 73, saturation was 95.8%, with low bicarbonate level of 21.1.  Chest x-ray, portable, single view 07/18/2012 revealed CHF with mild pulmonary interstitial edema. CT scan of abdomen and pelvis without contrast 07/18/2012 revealed cholelithiasis, no evidence of acute pancreatitis.   ASSESSMENT AND PLAN: 1.  Acute pancreatitis, as judged by abdominal pain as well as elevated lipase. Admit patient to  medical floor. Keep him n.p.o. I will not be able to start him on low IV  fluids due to congestive heart failure. Get ultrasound of the abdomen to rule out obstruction.  2.  Hypertension. Initiate Levophed. Unable to give IV fluids due to congestive heart failure and hypoxia. 3. Elevated transaminases as well as bilirubin. Get direct bilirubin and check ultrasound in the morning.  4.  Chest pain. Will not able to give nitroglycerin or beta blocker due to hypotension. Will get cardiac enzymes x 3 as well as  cardiology consultation in the morning.  5.  Renal insufficiency. Get bladder scan and place Foley catheter if patient is not emptying bladder. The patient's urinalysis is unremarkable. Will not need cultures at this time. Does not seem to be urinary tract infection.  6.  Congestive heart failure. Will not be able to give Lasix due to hypotension as well as pancreatitis and being n.p.o. However, we may be able to give the Lasix if patient becomes significantly hypoxic.   Time spent:  1 hour.   ____________________________ Theodoro Grist, MD rv:mr D: 07/18/2012 19:16:38 ET T: 07/18/2012 20:35:54 ET JOB#: [id number]  cc: Theodoro Grist, MD, <Dictator> Dory Horn. Eliberto Ivory, MD  Theodoro Grist MD ELECTRONICALLY SIGNED 08/24/2012 18:42

## 2014-08-01 NOTE — Op Note (Signed)
PATIENT NAME:  [name], Matthew Miles MR#:  [id number] DATE OF BIRTH:  May 17, 1928  DATE OF PROCEDURE:  06/19/2012  PREOPERATIVE DIAGNOSIS: Phimosis.   POSTOPERATIVE DIAGNOSIS: Phimosis.  PROCEDURE: Circumcision.   SURGEON: Edrick Oh, M.D.   ANESTHESIA: Laryngeal mask airway anesthesia.   INDICATIONS: The patient is an 81 year old gentleman with poorly controlled diabetes mellitus. He has developed progressive phimosis and balanitis. He is completely unable to withdrawal the foreskin. He presents for circumcision.   DESCRIPTION OF PROCEDURE: After informed consent was obtained, the patient was taken to the operating room and placed in the supine position on the operating table under laryngeal mask airway anesthesia. The patient was then prepped and draped in the usual standard fashion. The foreskin was forcibly withdrawn. It was noted to be very tight. Multiple excoriations resulted from withdrawing the foreskin. A significant amount of smegma was present underneath and this was cleaned with additional Betadine solution. A circumferential incision was then made approximately 2 mm proximal to the coronal sulcus. Once the incision was completed, the foreskin was withdrawn back over the glans penis. A hemostat was placed at the 6 and 12 o'clock positions at the level of the glans penis. A second circumferential incision was made at this area. Iris scissors were then passed underneath the foreskin cuff. An incision was made through the foreskin cuff utilizing electrocautery. The foreskin cuff was then removed circumferentially utilizing electrocautery. Several areas of bleeding were encountered, which were also cauterized adequately with no significant bleeding overall noted. Once the foreskin was removed, several skin bleeders were cauterized. The skin edges were then reapproximated utilizing interrupted 3-0 Monocryl suture. At the 6 and 12 o'clock positions, additional interrupted 3-0 Monocryl sutures were  placed circumferentially to complete the re-anastomosis. A Vaseline gauze, Kling and Coban dressing was then applied. Marcaine 10 mL of 0.5% was injected along the penile base for a penile block. The patient was then awakened from laryngeal mask airway anesthesia and was taken to the recovery room in stable condition. There were no problems or complications. The patient tolerated the procedure well. Estimated blood loss was minimal. The foreskin was sent to pathology for further evaluation.   ____________________________ Denice Bors Jacqlyn Larsen, MD bsc:aw D: 06/19/2012 13:04:27 ET T: 06/19/2012 13:37:44 ET JOB#: [id number]  cc: Denice Bors. Jacqlyn Larsen, MD, <Dictator> Denice Bors Sanaia Jasso MD ELECTRONICALLY SIGNED 06/20/2012 8:20

## 2014-08-01 NOTE — Consult Note (Signed)
PATIENT NAME:  Matthew Miles, Matthew Miles MR#:  [id number] DATE OF BIRTH:  February 11, 1929  DATE OF CONSULTATION:  07/19/2012  ATTENDING PHYSICIAN:  Harrell Gave A. Hrithik Boschee, MD  DATE OF CONSULTATION: 07/19/2012.   REASON FOR CONSULT: Elevated lipase, abdominal pain, hypotension, elevated LFTs and gallstones.   HISTORY OF PRESENT ILLNESS: The patient is an pleasant 81 year old male with a history of COPD, CHF, and a history of peptic ulcer disease and hematemesis who presents with a  1-day history of abdominal pain. He said that it was around his bellybutton, radiating to his back. He began having vomiting and worsening pain. Upon admission he was hypotensive and required resuscitation. Before this he has been feeling fine. He has never had this problem before. Currently has no pain. No nausea, vomiting. No fevers, chills, night sweats, shortness of breath, cough, chest pain, nausea, vomiting, diarrhea, constipation, dysuria, hematuria. Lipase was elevated on admission, as were LFTs. LFTs are still elevated today.   PAST MEDICAL HISTORY: Is as follows:   1.  History of COPD, on oxygen at home.  2.  History of tachycardia.  3.  History of renal failure.  4.  History of CHF.  5.  History of peptic ulcer disease, with hematemesis.  6.  History of circumcision for phimosis.  7.  History of thrombocytopenia.  8.  History of hypertension.  9.  History of hyperlipidemia.  10.  History of appendectomy.   SOCIAL HISTORY: History of 1 pack a day times approximately 65 years. No alcohol or drug abuse. 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   MEDICATIONS:  1.  Albuterol.  2.  Aspirin.  3.  Glipizide.  4.  Klor-Con.  5.  Nitrostat.  6.  Omeprazole.  7.  Pravastatin.  8.  Proventil.  9. Torsemide.   ALLERGIES: No known drug allergies.   REVIEW OF SYSTEMS: 12-point review of systems was obtained. Pertinent positives and negatives as above.   PHYSICAL EXAMINATION: Currently temperature 97.7, pulse 64, blood  pressure 111/55, respirations 16, 97% on 2 liters.  GENERAL: No acute distress. Alert and oriented x3.  EYES: Some scleral icterus. No conjunctivitis.  FACE: No obvious facial trauma. Normal external nose. Normal external head.  CHEST: Lungs clear to auscultation, moving air well.  HEART: Regular rate and rhythm. No murmurs, rubs, or gallops.  ABDOMEN: Soft, nondistended, nontender.  EXTREMITIES: Strength 5/5 in all 4 extremities. Moves all extremities well.  NEUROLOGIC: Sensation: Cranial nerves II-XII grossly intact. Sensation intact, all  4 extremities.   LABS: Are significant for currently a white cell count of 22,000, hemoglobin of 12.2, hematocrit of 36.7, platelets of 164.   Does have a lipase of 1682, which is down from greater than 3000. LFTs are significant for a bilirubin of 4.2, which was up from admission. Alk phos of 183 which was up, and AST of 902,  ALT of 807. Creatinine is 1.59. Troponins are negative.   CT scan shows gallstones. No obvious pancreatitis.   ASSESSMENT AND PLAN: The patient is a pleasant 81 year old with umbilical pain which radiated to the back, elevated lipase and LFTs. His lipase is returning to normal. He is currently asymptomatic. His LFTs continue to go up.   I believe this is probably a pancreatitis essentially due to gallstones. His LFTs are up, and would continue to hydrate, keep n.p.o. until lipase is normal, although he is symptomatically better. Will have GI evaluate for potential retained stone. Would obtain ultrasound. Will discuss possible cholecystectomy, although the patient may be a poor operative  candidate. Will have to have cleared with cardiology and medicine services prior. Will continue to follow.      ____________________________ Glena Norfolk Keelin Neville, MD cal:dm D: 07/19/2012 09:32:07 ET T: 07/19/2012 09:51:49 ET JOB#: [id number]  cc: Harrell Gave A. Ryheem Jay, MD, <Dictator> Floyde Parkins MD ELECTRONICALLY SIGNED  07/21/2012 17:12

## 2014-08-01 NOTE — Discharge Summary (Signed)
PATIENT NAME:  Matthew Miles, Matthew Miles MR#:  [id number] DATE OF BIRTH:  Sep 22, 1928  DATE OF ADMISSION:  07/18/2012 DATE OF DISCHARGE:  07/27/2012  ADMITTING PHYSICIAN: Theodoro Grist, MD  DISCHARGING PHYSICIAN: Gladstone Lighter, MD.   PRIMARY CARE PHYSICIAN: Eduard Clos. Gilford Rile, MD   PRIMARY CARDIOLOGIST: Minna Merritts, MD  Cook:  1. Surgical consultation with Dr. Rexene Edison. 2. GI consultation with Dr. Verdie Shire.  3. Cardiology consultation with Dr. Esmond Plants and Dr. Fletcher Anon.  DISCHARGE DIAGNOSES:  1. Sepsis.  2. Acute gallstone pancreatitis.  3. Escherichia coli bacteremia.  4. Status post cholecystectomy this admission.  5. Status post endoscopic retrograde cholangiopancreatography and stent placement for a benign stricture in the common bile duct noticed on intraoperative cholangiogram.  6. Acute on chronic diastolic congestive heart failure exacerbation with ejection fraction of 55%.  7. Chronic obstructive pulmonary disease.  8. Diabetes mellitus.  9. Hypertension.  10. Gastroesophageal reflux disease.  11. Acute renal failure, which resolved.  DISCHARGE MEDICATIONS:  1. Klor-Con 20 mEq p.o. daily.  2. Pravastatin 40 mg p.o. at bedtime.  3. Omeprazole 20 mg p.o. b.i.d.  4. Proventil inhaler 2 puffs q.4 hours as needed for wheezing.  5. Albuterol 3 mL inhaled every 6 hours as needed for shortness of breath or wheezing.  6. Glipizide 5 mg p.o. daily.  7. Torsemide 20 mg p.o. 1 to 2 times a day.  8. Norco 5/326 mg every 8 hours as needed for pain.  9. Carvedilol 3.125 mg 1 tablet p.o. b.i.d.  10. Symbicort 160/4.5 two puffs b.i.d.  11. Keflex 500 mg p.o. t.i.d. for 5 more days.  12. Levaquin 500 mg p.o. daily for 5 more days.  13. Prednisone taper, start at 60 mg p.o. daily and taper off by 10 mg every day.   DISCHARGE DIET: Low sodium, low fat and low cholesterol diet. Advise to continue full liquid diet for tomorrow, and then soft diet for the next 3  days, prior to changing to a complete solid diet.   DISCHARGE ACTIVITY: As tolerated.    FOLLOWUP INSTRUCTIONS:  1. Follow up with Dr. Rexene Edison in 1 week.  2. PCP followup in 2 weeks.  3. Follow up with [name] in 2 weeks.  4. Follow up with Dr. Candace Cruise in 1 week.   HOME HEALTH: With PT and nursing.   LABS AND IMAGING STUDIES PRIOR TO DISCHARGE: WBC 15.2 while on steroids, hemoglobin 10.5, hematocrit 32.1, platelet count is 129. Sodium 133 with potassium 3.7, chloride 99, bicarbonate 26, BUN 14, creatinine 1.17, glucose 215 and calcium of 8.2. ALT 16, AST 23, alkaline phosphatase 161, total bilirubin 1.4, albumin of 3.6. Lipase is low at 39. Blood cultures are negative, drawn from 07/26/2012. Blood cultures on admission were growing E. coli resistant to fluoroquinolones and Bactrim and sensitive to cephalosporins. CT of the abdomen and pelvis with contrast done prior to discharge showing moderate-sized fluid collection in the gallbladder fossa, could be secondary to postop changes; abscess or biloma are not excluded. Focal wall thickening of hepatic flexure edges into the gallbladder fossa, may be reactive colitis or injury to the bowel wall is not excluded. Approximately half of the common bile duct stent extends into the duodenum. Area of mixed lucency and sclerosis in posterior left ischium which is new from CT of 2009, which is indeterminate. Further evaluation by MRI is recommended. Abdominal x-ray showing no definite bowel obstruction. Surgical changes are present. Gallbladder pathology report from surgery showing chronic cholecystitis  and cholelithiasis, negative for dysplasia and malignancy. Intraoperative cholangiogram showing moderate length segment of narrowing of the inferior common bile diet representing benign or malignant stricture. ERCP suggested. Chest x-rays showing pleural effusions and basilar opacities. Ultrasound of the abdomen showing gallstones. No biliary distention on admission.  His ALT is elevated at 807, AST elevated at 902, alkaline phosphatase of 183, total bilirubin 4.2, albumin was 3.6, with renal failure with creatinine of 1.59 on admission. An elevated white count of 22,000. Urinalysis negative for any infection. He did have a CT of the abdomen and pelvis without contrast on admission which showed no evidence of acute pancreatitis but cholelithiasis present.   BRIEF HOSPITAL COURSE: The patient is an 81 year old pleasant Caucasian male with past medical history significant for COPD, diastolic CHF with EF of greater than 55%, hypertension and diabetes mellitus, who presented to the hospital on 07/18/2012 secondary to severe abdominal pain, difficulty breathing and was also found to be hypotensive.   1. Septic shock secondary to Escherichia coli bacteremia, likely source could have been acute cholecystitis and was also noted to have acute pancreatitis on admission. CT of the abdomen was done without contrast and did not show any pancreatitis, but he did have gallstones. He was treated with IV Zosyn while sensitivities were pending. His E. coli was resistant to fluoroquinolones and Bactrim and other oral antibiotics. He was mostly kept n.p.o. and was seen by surgery in consultation. Because of his underlying risk factors, surgical team requested a cardiology clearance, so he had cardiology and pulmonary clearance because of his COPD and CHF, but he was a moderate risk for surgery. He did finally have laparoscopic cholecystectomy done on 07/22/2012 by Dr. Rexene Edison. Also, during intraoperative cholangiogram, the patient was found to have a distal common bile duct stricture, which could be benign or malignant, so ERCP was suggested. The patient did have ERCP done by Dr. Candace Cruise on 07/23/2012 with dilatation and stent placement, and brushing pathology is pending. Most likely looks like a benign stricture from what Dr. Candace Cruise has suggested. Post ERCP, the patient has not developed any  pancreatitis, but he did continue to have low-grade fevers, worsening of right upper quadrant abdominal pain for which a CT of the abdomen was ordered. The CT showed fluid and air in the gallbladder fossa, most likely postoperative changes; however, abscess or biloma cannot be excluded. The patient was changed from oral antibiotics to IV Invanz. Again, he has not had further fevers. He is able to tolerate full liquid diet well. Today, he does not have any tenderness in the right upper quadrant region. The films were seen by Dr. Leanora Cover from surgery, who suggested these are routine postoperative changes. The patient ambulated well, breathing good, so he is being discharged on oral Keflex based on his sensitivities for the E. coli bacteremia and also on Levaquin for his intra-abdominal infection. His repeat blood cultures were negative.  2. Acute on chronic diastolic congestive heart failure exacerbation. The patient was given IV fluids secondary to sepsis when he came in. While he was in CCU, he also required Levophed, so his diuretics were held, so that resulted in pulmonary edema and difficulty breathing. He was hypoxic, requiring 2 liters of oxygen before and after his surgery. After diuresing him well and IV fluids were stopped, his breathing is better, and he is going home on his home dose of torsemide and potassium supplements. He is ambulating well, and sats are 92% on room air with ambulation. Cardiology consult has  been appreciated.  3. Chronic obstructive pulmonary disease exacerbation, again likely exacerbated by his surgery and congestive heart failure. He was on IV Solu-Medrol and changed over to p.o. prednisone taper, and he can continue his inhalers at the time of discharge.  4. Diabetes mellitus. Glipizide is being restarted and diet control recommended.  5. Gastroesophageal reflux disease. The patient is on omeprazole b.i.d.   6. His course has been otherwise uneventful in the hospital. He did  work very well with physical therapy, who has recommended home health versus outpatient services, so the patient is being discharged home with home health.   DISCHARGE CONDITION: Stable.   TIME SPENT ON DISCHARGE: 45 minutes.   ____________________________ Gladstone Lighter, MD rk:OSi D: 07/27/2012 15:18:37 ET T: 07/28/2012 11:17:53 ET JOB#: [id number]  cc: Gladstone Lighter, MD, <Dictator> Christopher A. Rexene Edison, MD Lupita Dawn. Candace Cruise, MD Minna Merritts, MD Eduard Clos Gilford Rile, MD Gladstone Lighter MD ELECTRONICALLY SIGNED 08/09/2012 15:34

## 2014-08-01 NOTE — Consult Note (Signed)
Brief Consult Note: Diagnosis: Admittted for abdominal pain with associated nausea and vomiting. Elevated lipase level.  CT scan of abdomen ane pelvis revealed evidence of gallstones but no finding of dilatation of CBD or CBD stone.  Admitted for suspected acute gallstone pancreatitis.  Hypotensive.   Consult note dictated.   Discussed with Attending MD.   Comments: Patient's presentation discussed with Dr. Verdie Shire.  Patient was seen by Dr. Candace Cruise as well.  Recommendation at this time is not to proceed with ERCP this afternoon.  He is asymptomatic from the standpoint of having no abdominal pain, nausea has resolved.  No additional episodes of vomiting since yesterday evening.  Abdominal ultrasound was done this afternoon and did not reveal evidence of CBD being dilatated or stone. Recommend repeating LFT in the am.  If increase elevation then to proceed with MRCP tomorrow to allow additional imagining specifically of hepatobiliary system. If  MRCP does reveal positive finding then proceed with ERCP.  NPO status to remain in place.  Continue pain management as well as antibiotic therapy as ordered.  Will continue to monitor..  Electronic Signatures: Payton Emerald (NP)  (Signed 10-Apr-14 14:04)  Authored: Brief Consult Note   Last Updated: 10-Apr-14 14:04 by Payton Emerald (NP)

## 2014-08-01 NOTE — Consult Note (Signed)
Pt seen and examined with Dawn Harrison's. Please see her full consult. Admitted with likely gallstone pancreatitis. On IV Abx. Initially on pressors. No longer on pressors though SBP on low 90's still. Looks and feels lot better than yest. No abd pain now. No tenderness in abd. Lipase down. Gallstones present. LFT higher today but U/S showed normal CBD. No CBD stone or dilation present. Suspect patient passed CBD stone already. That may be why patient feeling much better. Pt will eventually need GB out surgically. No immediate plan for ERCP. Moniter LFT. If stays elevated, then order MRCP to evaluate CBD again. Wiil schedule ERCP if MRCP abnormal later. Will follow. Thanks.  Electronic Signatures: Verdie Shire (MD)  (Signed on 10-Apr-14 13:03)  Authored  Last Updated: 10-Apr-14 13:03 by Verdie Shire (MD)

## 2014-08-01 NOTE — Consult Note (Signed)
Difficulty with cannulating CBD. Guidewire kept going up the pancreatic duct. Thus, 5 Fr x 3cm PD stent placed 1st. Then, able to cannulate CBD with guidewire. CBD appeared normal. Biliary sphincterotomy done with excellent bile drainage afterwards. No obvious stone or stricture seen. Keep patient NPO rest of today except ice chips and meds. Recheck labs in AM, incl lipase to check for recurrent pancreatitis. I will be at [facility] tomorrow. If patient still here, will check back on Wed. Will need KUB in 10 days or so to see if PD stent remaining. If yes, will schedule EGD as outpt to remove PD stent. Thanks.  Electronic Signatures: Verdie Shire (MD)  (Signed on 14-Apr-14 17:05)  Authored  Last Updated: 14-Apr-14 17:05 by Verdie Shire (MD)

## 2014-08-01 NOTE — Op Note (Signed)
PATIENT NAME:  Matthew Miles, Matthew Miles MR#:  [id number] DATE OF BIRTH:  06/28/1928  DATE OF PROCEDURE:  07/22/2012  ATTENDING PHYSICIAN:  Dr. Chauncey Reading.   PREOPERATIVE DIAGNOSIS: Gallstone pancreatitis.   POSTOPERATIVE DIAGNOSIS:  Gallstone pancreatitis.    PROCEDURE PERFORMED: Laparoscopic cholecystectomy with cholangiogram.   ANESTHESIA:  General.    SPECIMENS: Gallbladder.   ESTIMATED BLOOD LOSS:  250 mL.     COMPLICATIONS: None.   INTRAOPERATIVE FINDINGS: Cholangiogram which showed no obvious filling defects, however, per radiologist's read concern for distal common bile duct stricture.   INDICATION FOR SURGERY: The patient is a pleasant 81 year old male who was admitted earlier this week with hypotension and elevated lipase. He was found have a gallstone. It was believed that he had severe pancreatitis. He, thus, underwent laparoscopic cholecystectomy with cholangiogram as a prophylactic measure.   DETAILS OF PROCEDURE: Informed consent was obtained.  The patient was brought to the Operating Room. He was induced, endotracheal tube was placed and general anesthesia was administered. His abdomen was prepped and draped in standard surgical fashion. A timeout was then performed, correctly identifying the patient name, operative site and procedure to be performed.   A transverse incision was made supraumbilically.  It was taken down to the fascia. The fascia was incised. The peritoneum was entered, two 0 Vicryl stay sutures were placed. A Hasson trocar was placed in the abdomen and the abdomen was insufflated. The gallbladder was visualized. There were midline adhesions from his previous what was thought to be antrectomy and his liver was adhesed to his anterior abdominal wall. I then placed a 5 mm trocar at the anterior axillary line and proceeded to cut all adhesions between the anterior abdominal wall and the liver. I then placed a midclavicular trocar at approximately 2 cm below the right  costal margin and an epigastric trocar to the right of the falciform ligament. I then retracted the gallbladder by the fundus over the dome of the liver and dissected out portal triad. I placed a single clip across the distal cystic duct and then made an incision. I then put an Angiocath through the anterior abdominal wall and placed a Reddick catheter through this and into the cystic duct, I then performed a cholangiogram. There were no obvious filling defects, however, postoperatively I received a telephone call from radiology concerned that there may be a distal stricture in need of investigation. The cholangiogram catheter was then taken out. I then clipped the cystic duct 3 times and ligated it. I then clipped the cystic artery 3 times and ligated it. I then took the gallbladder off the gallbladder fossa. There was a small blood vessel posteriorly which was cauterized and required dissection out and placement of 3 clips. I then took the gallbladder off the gallbladder fossa.  It was removed through the umbilical trocar site with an Endo Catch bag.  I then looked at the gallbladder. I used Bovie electrocautery for hemostasis as his platelets were borderline. I did put Surgiflo into the fossa.    After everything was hemostatic, I removed all trocars under direct visualization. The abdomen was  desufflated. I then closed the umbilical fascia with a figure-of-eight 0 Vicryl on a UR6 needle. I then closed the skin using interrupted 4-0 Monocryl deep dermal sutures. Steri-Strips, Telfa gauze and Tegaderm were then used to complete the dressing. The patient was then awoken, extubated and brought to the postanesthesia care unit. There were no immediate complications. Needle, sponge, and instrument counts were correct at  the end of the procedure.    ____________________________ Glena Norfolk. Sama Arauz, MD cal:cs D: 07/22/2012 19:30:40 ET T: 07/22/2012 20:44:17 ET JOB#: [id number]  cc: Harrell Gave A. Davinia Riccardi,  MD, <Dictator> Floyde Parkins MD ELECTRONICALLY SIGNED 07/25/2012 16:15

## 2014-08-01 NOTE — Consult Note (Signed)
CC: gallstone pancreatitis,  Pt LFT's better today ALT and AST but TB about same.  He has no more pain, no nausea, no vomiting.  Has decreased air flow on chest exam but no wheezing at my exam. [place] has been removed since his oxygen sat is better after walking.  He should get his GB out if he is an operative candidate due to likely repeat event.  An alternative is to do an ERCP with sphincterotomy instead of GB removal. This may allow for small stones to pass without clinical illness.   Will discuss with Dr. Candace Cruise if I can reach him this weekend.  A pulmonary consult was ordered and await their opinion.  Electronic Signatures: Manya Silvas (MD)  (Signed on 12-Apr-14 10:32)  Authored  Last Updated: 12-Apr-14 10:32 by Manya Silvas (MD)

## 2014-08-01 NOTE — Consult Note (Signed)
Chief Complaint:  Subjective/Chief Complaint Felt well yest but last night, became weak and SOB. CXR suggests pulm edema. No pancreatitis.   VITAL SIGNS/ANCILLARY NOTES: **Vital Signs.:   16-Apr-14 13:07  Vital Signs Type Routine  Temperature Temperature (F) 98.8  Celsius 37.1  Temperature Source oral  Pulse Pulse 100  Respirations Respirations 20  Systolic BP Systolic BP 390  Diastolic BP (mmHg) Diastolic BP (mmHg) 70  Mean BP 91  Pulse Ox % Pulse Ox % 92  Pulse Ox Activity Level  At rest  Oxygen Delivery 1L   Brief Assessment:  Cardiac Regular   Respiratory crackles   Gastrointestinal Normal   Lab Results: Hepatic:  15-Apr-14 04:29   Bilirubin, Total 0.9  Bilirubin, Direct  0.5 (Result(s) reported on 24 Jul 2012 at 05:14AM.)  Alkaline Phosphatase 128  SGPT (ALT)  116  SGOT (AST)  47  Total Protein, Serum  5.5  Albumin, Serum  2.5  Routine Chem:  15-Apr-14 04:29   Lipase  45 (Result(s) reported on 24 Jul 2012 at 05:10AM.)  Glucose, Serum  119  BUN 13  Creatinine (comp) 1.11  Sodium, Serum 142  Potassium, Serum 4.1  Chloride, Serum  111  CO2, Serum 25  Calcium (Total), Serum  7.9  Osmolality (calc) 284  Anion Gap  6 (Result(s) reported on 24 Jul 2012 at 05:10AM.)   Assessment/Plan:  Assessment/Plan:  Assessment S/P ERCP. LFT down. No pancreatitis.   Plan Solids. Order KUB in 10days for stent placement. IF stent still in place, then EGD with stent removal afterwards as outpt. Will sign off. Thanks.   Electronic Signatures: Verdie Shire (MD)  (Signed 16-Apr-14 16:42)  Authored: Chief Complaint, VITAL SIGNS/ANCILLARY NOTES, Brief Assessment, Lab Results, Assessment/Plan   Last Updated: 16-Apr-14 16:42 by Verdie Shire (MD)

## 2014-08-01 NOTE — Consult Note (Signed)
General Aspect Matthew Miles is a 81 year old gentleman with PMHx s/f CAD (cath 05/2011: LAD CTO, EF 55-65%), chronic diastolic CHF (on torsemide), moderate PAH, NSVT, DM2, HTN, HLD, tobacco abuse (> 60 pack-years), COPD (w/ frequent exacerbation req nebs, steroids and abx) who was admitted to [facility] with acute gallstone pancreatitis.   R/LHC 05/2011: RHC showed significant fluid overload with wedge pressure of 24, moderate PAH; LHC showed occluded LAD which was old, no other significant stenoses requiring intervention.  2D ECHO 05/2011: EF 69-67%, grade1 diastolic dysfunction, mild LVH, mild AI, mild MR, mild LA dilatation, mod PA dilatation, PASP 65 mmHg.  He was last seen by [name] in the office in 05/2012, and was deemed to be stable from a cardiac standpoint overall.  He reports being in his USOH until 3 days ago when he developed abdominal pain radiating to his pain after eating breakfast with associated nausea and vomiting. He denies preceding exertional chest pain, SOB/DOE, PND, orthopnea, LE edema, palpitations, lightheadedness or syncope. No active bleeding. He does endorse a new nonproductive cough. He states that his weight has been fairly stable at home (baseline weight 180 lbs). No change to functional status.   Present Illness In the ED, lipase returned > 3000. BNP 600s. CXR c/w mild CHF. Trop-I neg (two subsequent drawings WNL). Leukocytosis at 14.5. Cr 1.46. EKG without evidence of ST/T changes. He was noted to be hypotensive (SBP 80s) and started on pressors and BS abx for sepsis. CT abd/pelvis indicated cholelithiasis likely supporting gallstone pancreatitis accounting for the patient's abdominal pain and sepsis. He was weaned off pressors. IVF were given liberally for hypotension. Antihypertensives and outpatient torsemide were held. Weight 180 lbs on admission. I/O + 760 mL. GI and surgery consulted. Plan is for tentative cholecystectomy pending pre-op cardiac eval.  PAST MEDICAL  HISTORY:  Significant for history of recent circumcision for phimosis by Dr. Jacqlyn Larsen on 06/19/2012, history of peptic ulcer disease, hematemesis on January 2013 admission, COPD with chronic respiratory failure, on oxygen at home, history of  tachycardia, nonsustained V tach, history of acute renal failure, tobacco abuse, quit smoking approximately 1-1/2 years ago, history of generalized weakness, thrombocytopenia, hypertension, hyperlipidemia, history of GI bleed, operated in 1980s, history of appendectomy.  SOCIAL HISTORY:  The patient used to smoke from age of 81 until age of 30. Used to smoke at least 1 pack of cigarettes a day. No history of alcohol or drug abuse. 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 Lives at home alone. The patient's wife lives in a skilled nursing facility.  FAMILY HISTORY: Son with diabetes. Father with diabetes.   Physical Exam:  GEN well developed, well nourished, no acute distress   HEENT PERRL, hearing intact to voice   NECK supple  No masses  trachea midline   RESP no use of accessory muscles  wheezing  fine bibasilar rales, exp wheezing, mildly increased resp effort   CARD Regular rate and rhythm  Normal, S1, S2  No murmur   ABD denies tenderness  normal BS   EXTR negative cyanosis/clubbing, negative edema   SKIN normal to palpation   NEURO follows commands, motor/sensory function intact   PSYCH alert, A+O to time, place, person   Review of Systems:  Subjective/Chief Complaint abdominal pain   Respiratory: Frequent cough  Wheezing   Gastrointestinal: Nausea     DM:    Hypercholesterolemia:    CAD:    Cancer Surgery:    Appendectomy:     Dexamethasone injection,  ( Decadron injection )  10 mg, IV push, STAT  Indication: Inflammation/Allergies/Postop Nausea/Neoplasms/Cerebral Edema/Septic Shock, 18-Jul-2012, Completed, Standard   Magnesium Sulfate injection, 2 gram in Sterile Water 50 ml, IV Piggyback, STAT, Infuse over 20 minute(s)  Indication: Magnesium  Deficiency/ Pre-Eclampsia/ Eclampsia, 18-Jul-2012, Completed, Standard   Furosemide injection, ( Lasix injection )  20 mg, IV push, once  Indication: Diuresis, 18-Jul-2012, Discontinued, Standard   Ondansetron tablet,  ( Zofran)  4 mg Oral STAT  - Indication: Nausea/ Vomiting, 18-Jul-2012, Completed, Standard   Acetaminophen * tablet, ( Tylenol (325 mg) tablet)  650 mg Oral q4h PRN for pain or temp. greater than 100.4  - Indication: Pain/Fever, 18-Jul-2012, Active, Standard   Insulin SS -Novolog injection, Subcutaneous, FSBS before meals and at bedtime  0 units if FSBS 0-150     2 unit(s) if FSBS 151 - 200     4 unit(s) if FSBS 201 - 250     6 unit(s) if FSBS 251 - 300     8 unit(s) if FSBS 301 - 350     10 unit(s) if FSBS 351 - 400  Call MD if FSBS is greater than 400, [Waste Code: Black], 18-Jul-2012, Active, Standard   MorphINE  injection, 2 to 4 mg, IV push, q4h PRN for pain  Indication: Pain, [Med Admin Window: 30 mins before or after scheduled dose], 18-Jul-2012, Active, Standard   Ondansetron injection, ( Zofran injection )  4 mg, IV push, q4h PRN for Nausea/Vomiting  Indication: Nausea/ Vomiting, 18-Jul-2012, Active, Standard   Piperacillin-Tazobactam injection, ( Zosyn )  3.375 gram, IV Piggyback, q8h, Infuse over 4 hour(s)  Indication: Infection  Pharmacy dose per Zosyn Ext Inf Protocol, -Then pharmacy to dose, 18-Jul-2012, Active, Standard   Promethazine injection, ( Phenergan injection )  6.25 to 12.5 mg, Intramuscular, q4h PRN for nausea, vomiting  Indication: Antiemetic/ Motion Sickness/ Sedative/ Antihistamine, 18-Jul-2012, Active, Standard   Sodium Chloride 0.9% injection, 2 ml, IV push, q6h, 18-Jul-2012, Active, Standard   Pneumococcal 23-valent Vaccine, 0.5 ml, Intramuscular, atdischarge  Indication: Pneumococcal Immunization, 0.32m IM once (Stored in [facility], 19-Jul-2012, Active, Standard   Insulin NovoLIN Regular injection,  ( Insulin  HumuLIN R injection )  4 unit(s), Subcutaneous, once  Indication: Diabetes, [Waste Code: Black], 19-Jul-2012, Completed, Standard   Pantoprazole injection,  ( Protonix injection )  40 mg, IV push, q6am  Indication: Erosive Esophagitis/ GERD, 1. Reconstitute Protonix with 157mof Normal Saline; concentration= 45m89mL. Once reconstituted; Protonix must be administered within 30 minutes.  2. I.V. line must be flushed before and after administration. Administer [id number]. push over 2-3 minutes., 19-Jul-2012, Discontinued, Standard   Potassium Chloride injection, 30 mEq in Sodium Chloride 0.9% 250 ml, IV Piggyback, once, Infuse over 3 hour(s)  Indication: ***PERIPHERAL LINE*** and K < 3.5, per Electrolyte Replacement protocol., 19-Jul-2012, Completed, Standard   Pantoprazole tablet,  ( Protonix)  40 mg Oral q6am  - Indication: Erosive Esophagitis/ GERD  Instructions:  DO NOT CRUSH, 20-Jul-2012, Active, Standard   Albuterol Oral inhaler, 2 puff(s) Inhalation q4h while awake with Spacer (Op  - Indication: Bronchodilator  Instructions:  [WastDustin Folks: SendToRx], 20-Jul-2012, Active, Standard   glipiZIDE tablet, ( Glucotrol)  5 mg Oral q24h  - Indication: Diabetes, 20-Jul-2012, Active, Standard  Home Medications: Medication Instructions Status  Klor-Con M20 oral tablet, extended release 1 tab(s) orally once a day Active  pravastatin 40 mg oral tablet 1 tab(s) orally once a day (at bedtime) Active  aspirin 81 mg oral tablet 1 tab(s) orally once a  day Active  omeprazole 20 mg oral delayed release capsule 1 cap(s) orally 2 times a day Active  Proventil HFA 90 mcg/inh inhalation aerosol 2 puff(s) inhaled every 4 hours, As Needed- for Shortness of Breath , for Wheezing  Active  albuterol 2.5 mg/3 mL (0.083%) inhalation solution 3 milliliter(s) inhaled every 6 hours, As Needed- for Shortness of Breath , for Wheezing  Active  glipiZIDE 5 mg oral tablet 1 tab(s) orally once a day Active   Nitrostat 0.4 mg sublingual tablet 1 tab(s) sublingual every 5 minutes, As Needed - for Chest Pain Active  torsemide 20 mg oral tablet 1 tab(s) orally 1 to 2 times a day Active   Lab Results:  Hepatic:  09-Apr-14 16:16   Bilirubin, Total  2.6  Alkaline Phosphatase  182  SGPT (ALT)  385  SGOT (AST)  694  Total Protein, Serum 7.5  Albumin, Serum 4.2  10-Apr-14 00:21   Bilirubin, Total  4.2  Alkaline Phosphatase  183  SGPT (ALT)  807  SGOT (AST)  902  Total Protein, Serum  6.3  Albumin, Serum 3.6  11-Apr-14 04:20   Bilirubin, Total  3.2  Alkaline Phosphatase  164  SGPT (ALT)  487  SGOT (AST)  247  Total Protein, Serum  5.8  Albumin, Serum  3.0  Routine Micro:  09-Apr-14 16:36   Organism Name GRAM NEGATIVE ROD  Micro Text Report BLOOD CULTURE   ORGANISM 1                GRAM NEGATIVE ROD   COMMENT                   IN AEROBIC AND ANAEROBIC BOTTLES   COMMENT                   ID TO FOLLOW   GRAM STAIN                GRAM NEGATIVE ROD   ANTIBIOTIC                        Organism 1 GRAM NEGATIVE ROD  Culture Comment IN AEROBIC AND ANAEROBIC BOTTLES  Culture Comment . ID TO FOLLOW  Gram Stain 1 GRAM NEGATIVE ROD    18:02   Organism Name GRAM NEGATIVE ROD  Micro Text Report BLOOD CULTURE   ORGANISM 1                GRAM NEGATIVE ROD   COMMENT                   IN AEROBIC AND ANAEROBIC BOTTLES   COMMENT                   ID TO FOLLOW   GRAM STAIN                GRAM NEGATIVE ROD   ANTIBIOTIC                        Organism 1 GRAM NEGATIVE ROD  Culture Comment IN AEROBIC AND ANAEROBIC BOTTLES  Culture Comment . ID TO FOLLOW  Gram Stain 1 GRAM NEGATIVE ROD  Lab:  09-Apr-14 17:30   pH (ABG) 7.40  PCO2  34  PO2  73  FiO2 38  Base Excess -3.0  HCO3  21.1  O2 Saturation 95.8  O2 Device [place]  Specimen Site (ABG) RT RADIAL  Specimen Type (  ABG) ARTERIAL  Patient Temp (ABG) 37.0 (Result(s) reported on 18 Jul 2012 at 05:41PM.)  Cardiology:  09-Apr-14 15:59    Ventricular Rate 98  Atrial Rate 98  P-R Interval 148  QRS Duration 98  QT 368  QTc 469  P Axis 65  R Axis -25  T Axis 92  ECG interpretation Normal sinus rhythm Possible Left atrial enlargement Left anterior fascicular block Left ventricular hypertrophy with repolarization abnormality Cannot rule out Septal infarct (cited on or before 01-Oct-2007) Abnormal ECG When compared with [facility] 09-Apr-2011 23:42, Questionable change in initial forces of Septal leads T wave inversion more evident in Lateral leads ----------unconfirmed---------- Confirmed by OVERREAD, NOT ([id number]), editor PEARSON, BARBARA ([id number]) on 07/19/2012 12:34:57 PM  Routine Chem:  09-Apr-14 16:16   Glucose, Serum  182  BUN  26  Creatinine (comp)  1.46  Sodium, Serum 137  Potassium, Serum 4.1  Chloride, Serum 102  CO2, Serum 27  Calcium (Total), Serum 9.0  Osmolality (calc) 283  eGFR (African American)  52  eGFR (Non-African American)  44 (eGFR values <40m/min/1.73 m2 may be an indication of chronic kidney disease (CKD). Calculated eGFR is useful in patients with stable renal function. The eGFR calculation will not be reliable in acutely ill patients when serum creatinine is changing rapidly. It is not useful in  patients on dialysis. The eGFR calculation may not be applicable to patients at the low and high extremes of body sizes, pregnant women, and vegetarians.)  Anion Gap 8  Lipase  > 3000 (Result(s) reported on 18 Jul 2012 at 06:06PM.)  B-Type Natriuretic Peptide ([facility]  618 (Result(s) reported on 18 Jul 2012 at 04:59PM.)    16:36   Result Comment ANAEROBIC BLOOD CULTURE - NOTIFIED OF CRITICAL VALUE  - CALLED TO BYRON HADDOCK:07/19/12@0443   - READ-BACK PROCESS PERFORMED. AEROBIC BOTTLE - CRITICAL VALUE PREVIOUSLY NOTIFIED.  Result(s) reported on 20 Jul 2012 at 07:10AM.    18:02   Result Comment ANAEROBIC BLOOD CULTURE - NOTIFIED OF CRITICAL VALUE  - CALLED TO BYRON HADDOCK:07/19/12@0441   - READ-BACK  PROCESS PERFORMED.  - TPL AEROBIC BOTTLE - CRITICAL VALUE PREVIOUSLY NOTIFIED.  Result(s) reported on 20 Jul 2012 at 07:07AM.  10-Apr-14 00:21   Glucose, Serum  254  BUN  27  Creatinine (comp)  1.59  Sodium, Serum 140  Potassium, Serum  3.4  Chloride, Serum 106  CO2, Serum 27  Calcium (Total), Serum 8.8  Osmolality (calc) 293  eGFR (African American)  46  eGFR (Non-African American)  40 (eGFR values <686mmin/1.73 m2 may be an indication of chronic kidney disease (CKD). Calculated eGFR is useful in patients with stable renal function. The eGFR calculation will not be reliable in acutely ill patients when serum creatinine is changing rapidly. It is not useful in  patients on dialysis. The eGFR calculation may not be applicable to patients at the low and high extremes of body sizes, pregnant women, and vegetarians.)  Anion Gap 7    08:03   Lipase  1682 (Result(s) reported on 19 Jul 2012 at 08:55AM.)  11-Apr-14 04:20   Glucose, Serum  139  BUN  22  Creatinine (comp) 1.25  Sodium, Serum 143  Potassium, Serum 4.8  Chloride, Serum  111  CO2, Serum 26  Calcium (Total), Serum  8.4  Osmolality (calc) 291  eGFR (African American) >60  eGFR (Non-African American)  54 (eGFR values <6068min/1.73 m2 may be an indication of chronic kidney disease (CKD). Calculated eGFR is useful in patients with stable renal  function. The eGFR calculation will not be reliable in acutely ill patients when serum creatinine is changing rapidly. It is not useful in  patients on dialysis. The eGFR calculation may not be applicable to patients at the low and high extremes of body sizes, pregnant women, and vegetarians.)  Anion Gap  6  Lipase  63 (Result(s) reported on 20 Jul 2012 at 05:59AM.)  Cardiac:  09-Apr-14 16:16   Troponin I < 0.02 (0.00-0.05 0.05 ng/mL or less: NEGATIVE  Repeat testing in 3-6 hrs  if clinically indicated. >0.05 ng/mL: POTENTIAL  MYOCARDIAL INJURY. Repeat  testing in 3-6 hrs  if  clinically indicated. NOTE: An increase or decrease  of 30% or more on serial  testing suggests a  clinically important change)  10-Apr-14 00:21   Troponin I < 0.02 (0.00-0.05 0.05 ng/mL or less: NEGATIVE  Repeat testing in 3-6 hrs  if clinically indicated. >0.05 ng/mL: POTENTIAL  MYOCARDIAL INJURY. Repeat  testing in 3-6 hrs if  clinically indicated. NOTE: An increase or decrease  of 30% or more on serial  testing suggests a  clinically important change)  CK, Total  236  CPK-MB, Serum 0.9 (Result(s) reported on 19 Jul 2012 at 12:57AM.)    07:56   Troponin I < 0.02 (0.00-0.05 0.05 ng/mL or less: NEGATIVE  Repeat testing in 3-6 hrs  if clinically indicated. >0.05 ng/mL: POTENTIAL  MYOCARDIAL INJURY. Repeat  testing in 3-6 hrs if  clinically indicated. NOTE: An increase or decrease  of 30% or more on serial  testing suggests a  clinically important change)  CK, Total  304  CPK-MB, Serum 1.7 (Result(s) reported on 19 Jul 2012 at 08:23AM.)  Routine UA:  09-Apr-14 18:09   Color (UA) Yellow  Clarity (UA) Clear  Glucose (UA) Negative  Bilirubin (UA) Negative  Ketones (UA) Negative  Specific Gravity (UA) 1.015  Blood (UA) Negative  pH (UA) 5.0  Protein (UA) Negative  Nitrite (UA) Negative  Leukocyte Esterase (UA) Trace (Result(s) reported on 18 Jul 2012 at 06:26PM.)  RBC (UA) 2 /HPF  WBC (UA) 3 /HPF  Bacteria (UA) NONE SEEN  Epithelial Cells (UA) <1 /HPF  Mucous (UA) PRESENT  Hyaline Cast (UA) 3 /LPF (Result(s) reported on 18 Jul 2012 at [facility].)  Routine Coag:  09-Apr-14 16:16   Prothrombin  14.9  INR 1.2 (INR reference interval applies to patients on anticoagulant therapy. A single INR therapeutic range for coumarins is not optimal for all indications; however, the suggested range for most indications is 2.0 - 3.0. Exceptions to the INR Reference Range may include: Prosthetic heart valves, acute myocardial infarction, prevention of myocardial infarction,  and combinations of aspirin and anticoagulant. The need for a higher or lower target INR must be assessed individually. Reference: The Pharmacology and Management of the Vitamin K  antagonists: the seventh ACCP Conference on Antithrombotic and Thrombolytic Therapy. BSWHQ.7591 Sept:126 (3suppl): [id number]. A HCT value >55% may artifactually increase the PT.  In one study,  the increase was an average of 25%. Reference:  "Effect on Routine and Special Coagulation Testing Values of Citrate Anticoagulant Adjustment in Patients with High HCT Values." American Journal of Clinical Pathology 2006;126:400-405.)  Routine Hem:  09-Apr-14 16:16   WBC (CBC)  14.5  RBC (CBC) 4.70  Hemoglobin (CBC) 13.6  Hematocrit (CBC) 41.4  Platelet Count (CBC) 193 (Result(s) reported on 18 Jul 2012 at 04:37PM.)  MCV 88  MCH 29.0  MCHC 33.0  RDW  14.7  10-Apr-14 00:21   WBC (CBC)  22.0  RBC (CBC)  4.14  Hemoglobin (CBC)  12.2  Hematocrit (CBC)  36.7  Platelet Count (CBC) 164  MCV 89  MCH 29.4  MCHC 33.2  RDW  14.8  Neutrophil % 94.3  Lymphocyte % 2.0  Monocyte % 3.5  Eosinophil % 0.0  Basophil % 0.2  Neutrophil #  20.7  Lymphocyte #  0.4  Monocyte # 0.8  Eosinophil # 0.0  Basophil # 0.0 (Result(s) reported on 19 Jul 2012 at 12:59AM.)  11-Apr-14 04:20   WBC (CBC) 9.6 (Result(s) reported on 20 Jul 2012 at 06:10AM.)   EKG:  Interpretation NSR, LAD, LAFB, borderline LVH, poor R wave progression, significant artifact   Rate 98   EKG Comparision Not changed from  05/2012 tracing   Radiology Results: XRay:    09-Apr-14 16:04, Chest Portable Single View  Chest Portable Single View   REASON FOR EXAM:    Chest Pain  COMMENTS:       PROCEDURE: DXR - DXR PORTABLE CHEST SINGLE VIEW  - Jul 18 2012  4:04PM     RESULT: Comparison is made to the study of April 10, 2011.    The lungs are mildly hypoinflated. The interstitial markings are   diffusely increased and the pulmonary vascularity is  engorged. The   cardiac silhouette is enlarged.    IMPRESSION:  The findings are consistent with CHF with mild pulmonary   interstitial edema.     Dictation Site: 2    Verified By: DAVID A. Martinique, M.D., MD  [place]:    10-Apr-14 12:33, US Abdomen Limited Survey  US Abdomen Limited Survey   REASON FOR EXAM:    elevated transminaseds, bilirubin, pancrteatitis, r/o   CBD dilatation  COMMENTS:   Body Site: GB and Fossa, CBD, Head of Pancreas    PROCEDURE: [place]  - US ABDOMEN LIMITED SURVEY  - Jul 19 2012 12:33PM     RESULT: History: Elevated bilirubin. Pancreatitis.    Comparison Study: CT abdomen 07/18/2012.     Findings: No focal hepatic abnormality identified. Pancreas not   visualized. Gallstones are present. Negative Murphy's sign. Gallbladder   wall thickness 2.2 mm, the common bile duct caliber 5.2 mm.    IMPRESSION:   1. Gallstones.  2. No biliary distention. Pancreas not visualized due to overlying bowel   gas.        Verified By: Osa Craver, M.D., MD  Cardiology:    09-Apr-14 15:59, ECG  Ventricular Rate 98  Atrial Rate 98  P-R Interval 148  QRS Duration 98  QT 368  QTc 469  P Axis 65  R Axis -72  T Axis 92  ECG interpretation   Normal sinus rhythm  Possible Left atrial enlargement  Left anterior fascicular block  Left ventricular hypertrophy with repolarization abnormality  Cannot rule out Septal infarct (cited on or before 01-Oct-2007)  Abnormal ECG  When compared with [facility] 09-Apr-2011 23:42,  Questionable change in initial forces of Septal leads  T wave inversion more evident in Lateral leads  ----------unconfirmed----------  Confirmed by OVERREAD, NOT ([id number]), editor PEARSON, BARBARA ([id number]) on 07/19/2012 12:34:57 PM  ECG   CT:    09-Apr-14 18:44, CT Abdomen and Pelvis Without Contrast  CT Abdomen and Pelvis Without Contrast   REASON FOR EXAM:    (1) pancreatitis, elevated tramnsminases; (2) same,   r/o gallstone pancreatitis  COMMENTS:        PROCEDURE: CT  - CT ABDOMEN AND PELVIS W0  - Jul 18 2012  6:44PM     RESULT: Indication: Pancreatitis    Comparison: None    Technique: Multiple axial images from the lung bases to the symphysis   pubis were obtained without oral and without intravenous contrast.    Findings:  The lung bases are clear. There is no pleural or pericardial effusions.    No renal, ureteral, or bladdercalculi. No obstructive uropathy. No   perinephric stranding is seen. The kidneys are symmetric in size without   evidence for exophytic mass. There is relative bladder wall thickening   which may be secondary to underdistention versus cystitis.    The liver demonstrates no focal abnormality. There are tiny   cholelithiasis.. The spleen demonstrates no focal abnormality. The   adrenal glands are normal. The pancreas is atrophic, but otherwise normal.    There are surgical clips in the epigastric region from prior surgery. The   unopacified stomach, duodenum, small intestine, and large intestine are   unremarkable, but evaluation is limited by lack of oral contrast. There   is diverticulosis without evidence of diverticulitis. There is no     pneumoperitoneum, pneumatosis, or portal venous gas. There is no   abdominal or pelvic free fluid. There is no lymphadenopathy.     The abdominal aorta is normal in caliber with atherosclerosis.    The osseous structures are unremarkable.    IMPRESSION:     1. Cholelithiasis.  2. No evidence of acute pancreatitis.    Dictation Site: 1      Verified By: Jennette Banker, M.D., MD    Sulfa drugs: GI Distress  Vital Signs/Nurse's Notes: **Vital Signs.:   11-Apr-14 06:40  Vital Signs Type Routine  Temperature Temperature (F) 98.6  Celsius 37  Temperature Source oral  Pulse Pulse 74  Respirations Respirations 18  Systolic BP Systolic BP 497  Diastolic BP (mmHg) Diastolic BP (mmHg) 57  Mean BP 74  Pulse Ox % Pulse Ox % 94  Pulse Ox Activity Level   At rest  Oxygen Delivery Room Air/ 21 %    12:23  Vital Signs Type Routine  Temperature Temperature (F) 97.9  Celsius 36.6  Pulse Pulse 82  Respirations Respirations 20  Systolic BP Systolic BP 99  Diastolic BP (mmHg) Diastolic BP (mmHg) 50  Mean BP 66  *Intake and Output.:   11-Apr-14 02:08  Grand Totals Intake:   Output:  300    Net:  -300 24 Hr.:  165  Urine ml     Out:  300    02:08  Urinary Method  Urinal    05:15  Grand Totals Intake:  782 Output:      Net:  782 24 Hr.:  026  IV (Primary)      In:  734  IV (Secondary)      In:  48    Shift 07:00  Grand Totals Intake:  782 Output:  300    Net:  482 24 Hr.:  378  IV (Primary)      In:  588  IV (Secondary)      In:  48  Urine ml     Out:  300  Length of Stay Totals Intake:  3233.1 Output:  2240    Net:  993.1    Daily 07:00  Grand Totals Intake:  2697 Output:  5027    Net:  741 28 Hr.:  786  IV (Primary)      In:  2649  IV (Secondary)  In:  48  Urine ml     Out:  1750  Length of Stay Totals Intake:  3233.1 Output:  2240    Net:  993.1    11:33  Grand Totals Intake:   Output:  325    Net:  -325 24 Hr.:  -325  Urine ml     Out:  325    11:33  Urinary Method  Urinal    12:05  Grand Totals Intake:  760 Output:      Net:  760 24 Hr.:  435  Oral Intake      In:  760    12:05  Percentage of Meal Eaten  100    Shift 15:00  Grand Totals Intake:  760 Output:  325    Net:  500 37 Hr.:  435  Oral Intake      In:  760  Urine ml     Out:  325  Length of Stay Totals Intake:  3993.1 Output:  0488    Net:  1428.1    Impression Matthew Miles is a 81 year old gentleman with PMHx s/f CAD (cath 05/2011: LAD CTO, EF 55-65%), chronic diastolic CHF (on torsemide), moderate PAH, NSVT, DM2, HTN, HLD, tobacco abuse (> 60 pack-years), COPD (w/ frequent exacerbation req nebs, steroids and abx) who was admitted to [facility] with acute gallstone pancreatitis. Cardiology has been consulted for pre-op evaluation for  tentative cholecystectomy.   Plan 1. Gallstone pancreatitis 2. Acute on chronic diastolic CHF 3. CAD  4. Moderate PAH 5. COPD with chronic respiratory faliure 6. Type 2 DM 7. HTN 8. Hyperlipidemia  The patient does have a significant cardiac history and pre-operative risk factors including CAD, CHF and DM2. From a cardiac standpoint, he has remained relatively stable since seeing [name] in 05/2012. Concern more is his pulmonary dysfunction. He is able to complete > 4 METs, but is limited largely by his COPD. He has chronic respiratory failure as a result, and likely poor pulmonary reserve. He may require prolonged intubation post-operatively after receiving anesthesia. Objectively, he is mildly volume overloaded supported by CXR, elevated BNP, pulmonary rales and mildly elevated JVP. He has received fluids to stabilize BP off pressors. Hold IVF for now and give Lasix 82m IV x 1. Monitor output and hold parameters for SBP < 100. He denies any ischemic symptoms and last cath was a little over a year ago which revealed stable, chronic CAD. Would place him at moderate pre-operative risk. With new cough and active wheezing, may need to treat as A/COPD with SABA + ICS nebs, abx, steroids and continued O2 to optimize pulmonary status. May need formal pulmonary consult for further evaluation pre-operatively.   Electronic Signatures for Addendum Section:  AKathlyn Sacramento(MD) (Signed Addendum 11-Apr-14 17:21)  The patient was seen and examined. Agree with above. He is at mdoerate risk from a cardiac standpoint. He appears to be fluid overloaded. Will stop IVFs and give 1 dose of IV Lasix.   Electronic Signatures: AMeriel Pica(PA-C)  (Signed 11-Apr-14 16:34)  Authored: General Aspect/Present Illness, History and Physical Exam, Review of System, Past Medical History, Orders, Home Medications, Labs, EKG , Radiology, Allergies, Vital Signs/Nurse's Notes, Impression/Plan AKathlyn Sacramento(MD)   (Signed 11-Apr-14 17:21)  Co-Signer: General Aspect/Present Illness, History and Physical Exam, Review of System, Past Medical History, Orders, Home Medications, Labs, EKG , Radiology, Allergies, Vital Signs/Nurse's Notes, Impression/Plan   Last Updated: 11-Apr-14 17:21 by AKathlyn Sacramento(MD)

## 2014-08-01 NOTE — Consult Note (Signed)
PATIENT NAME:  Matthew Miles, Matthew Miles MR#:  [id number] DATE OF BIRTH:  [birth year]  DATE OF CONSULTATION:  07/19/2012  CONSULTING PHYSICIAN:  Payton Emerald, NP; Lupita Dawn. Candace Cruise, MD  PRIMARY CARE PHYSICIAN: Dr. Gilford Rile.  ATTENDING PHYSICIAN: Dr. Serita Grit.  REASON FOR CONSULTATION: Gallstone pancreatitis.   HISTORY OF PRESENT ILLNESS: The patient is a very pleasant 81 year old Caucasian gentleman who presented to [facility] Emergency Room yesterday via EMS after acute onset of abdominal pain at approximately 10:00 a.m. yesterday. The patient states he awoke, ate bacon and eggs, went to the rest home where he goes every day to take care of his wife, had a cup of coffee, started with mid abdominal pain and pain to his back at approximately 10:00 a.m. States that he went home, ate lunch inclusive of potato wedges as well as tomato and [place] sandwich. Abdominal pain worsened as well as his back pain. He went outside thinking maybe up and walking around, went out to his shop, pain worsened to his back. Came in, lay down, and pain to abdomen as well as his back progressively continued to intensify. He called his daughter who in turn called EMS. Upon presenting to the Emergency Room, states that he vomited twice, vomited once having CT scan of abdomen and pelvis done. He was nauseated at home after vomiting. Pain lessened. No fevers. Currently asymptomatic from the aspect of the abdominal pain or nausea, in fact stating wishing he could go home. The patient has had some abdominal discomfort intermittently over the past few weeks, but at that time had a bowel movement and states that his pain went away. Normally bowel pattern is 2 to 3 times a day. Had an episode of diarrhea 2 to 3 weeks ago. No rectal bleeding or no melena.   The patient has a significant medical history of COPD, tobacco abuse, though has stopped smoking now. The patient was noted to be very wheezy and short of breath. He required BiPAP for hypoxia. He was given  Decadron as well as a DuoNeb and his oxygen saturation improved. In the Emergency Room, he was found to be hypotensive with pressure of 80s/50s. Upon being admitted yesterday, blood pressure was 75/50, at the time of being interviewed 95/47. CT scan of abdomen and pelvis without contrast was performed, which revealed no perinephritic stranding, kidneys symmetric in size without evidence of exophytic mass. Relative gallbladder wall thickening noted secondary to underdistention versus cystitis. The liver demonstrated no focal abnormality. Evidence of tiny cholelithiasis. Spleen demonstrated no focal abnormality. Adrenal glands are normal. Pancreas atrophic, but otherwise normal. The patient had an abdominal ultrasound done this afternoon, which did not reveal any evidence of dilatation of common bile duct or retained common bile duct stone, but again evidence of cholelithiasis.   PAST MEDICAL AND SURGICAL HISTORY: History of recent circumcision for phimosis by Dr. Jacqlyn Larsen 06/19/2012, history of peptic ulcer disease, admitted for hemoptysis January 2013, COPD with chronic respiratory failure (the patient denies being on oxygen at home), history of tachycardia/nonsustained V. tach, history of acute renal failure, tobacco abuse (quit 1-1/2 years ago), thrombocytopenia, hypertension, hyperlipidemia, surgery for gastric ulcers in 1983, history of appendectomy, diabetes mellitus, hemorrhoid surgery, prostate cancer with radiation therapy.   FAMILY HISTORY: Father: Prostate cancer, deceased. Brother: Prostate cancer. No colon cancer or history of colonic polyp.   SOCIAL HISTORY: Had smoked from the age of 46 until age 8, was smoking a pack of cigarettes a day. No alcohol. No recreational drug use. 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  Resides by himself, as his wife resides in a skilled nursing facility.   MEDICATIONS: Albuterol 2.5 in 3 mL inhalation every 6 hours as needed, aspirin 81 mg a day, glipizide 5 mg daily, potassium 20 mEq  daily, Nitrostat 0.4 mg sublingual every 5 minutes as needed, omeprazole 10 mg twice a day, pravastatin 40 mg at bedtime, Proventil 2 puffs every 4 hours as needed and torsemide 10 mg 1 to 2 tablets a day.   ALLERGIES: NONE, THOUGH FAMILY HAS STATED IN THE PAST THAT HE DID NOT TOLERATE SULFA DRUGS, ENDED UP WITH GI UPSET.   REVIEW OF SYSTEMS: All 10 systems reviewed and checked and otherwise unremarkable other than what is stated above.   PHYSICAL EXAMINATION:  VITAL SIGNS: Temperature is 97.9, respirations 26, pulse 66, blood pressure 95/47, pulse oximetry 98% on O2.  GENERAL: Well-developed, well-nourished, 81 year old Caucasian gentleman. No acute distress noted. Very pleasant.  HEENT: Normocephalic, atraumatic. Pupils equal and reactive to light. Conjunctivae clear. Sclerae anicteric.  NECK: Supple. Trachea midline. No lymphadenopathy or thyromegaly.  PULMONARY: Symmetric rise and fall of chest. Clear to auscultation throughout.  CARDIOVASCULAR: Regular rate and rhythm, S1, S2. No murmurs, no gallops.  ABDOMEN: Soft, nondistended. Bowel sounds in 4 quadrants. No bruits. No masses. No evidence of hepatosplenomegaly.  RECTAL: Deferred.  MUSCULOSKELETAL: Moving all 4 extremities. No contractures. No clubbing.  SKIN: No rashes. No lesions.  EXTREMITIES: No edema.  NEUROLOGICAL: No gross neurological deficits.  PSYCHIATRIC: Alert and oriented x 4. Memory grossly intact. Appropriate affect and mood.   LABORATORY AND DIAGNOSTIC DATA: Chemistry panel: Glucose on admission was 182, today's date has risen to 254. B-type natriuretic peptide 1618. BUN was 26 with creatinine of 1.46 on admission, today 27 and 1.59. Potassium was 4.1 on admission, has declined to 3.4. Lipase greater than 3000, currently 1682. Hepatic panel: Total bilirubin on admission was 2.6, alkaline phosphatase 182, AST 694 with an ALT of 385. Total protein was 7.5. Albumin was 4.2. Today, protein has dropped to 6.3, albumin of 3.6.  Total bilirubin is 4.2 as well as alkaline phosphatase of 183. AST has risen to 902 with an ALT of 807. Troponin on admission less than 0.2. CK total has risen to 236 and second in series is 304. CK-MB was 0.9, currently 1.7. WBC count on admission 14.5, hemoglobin and hematocrit within normal limits at 13.6 and 41.4. White count has risen to 22.0, hemoglobin 12.2 with hematocrit of 36.7. On differential, neutrophil number 20.7 with lymphocyte number 0.4. PT is 14.9 with an INR of 1.0. Blood culture revealed evidence of gram-negative rod. Urinalysis essentially unremarkable. Chest x-ray revealed findings consistent with CHF with mild pulmonary interstitial edema.   IMPRESSION: Admitted for abdominal pain with associated nausea and vomiting, elevated lipase level which has noted to decline since admission. CT scan of abdomen and pelvis revealed evidence of gallstones, but no finding of dilatation of common bile duct or common bile duct stone. Admitted for suspected acute gallstone pancreatitis, hypotension, increased rise in transaminase levels since being admitted.   PLAN: The patient's presentation was discussed with Dr. Verdie Shire. The patient was seen by Dr. Candace Cruise as well. Recommendation at this time is not to proceed ERCP this afternoon. He is asymptomatic from the standpoint of no abdominal pain. Nausea has resolved. No additional episodes of vomiting since yesterday evening. Abdominal ultrasound was done this afternoon, which did not reveal evidence of common bile duct being dilated or stone. Recommendation is to repeat LFTs in the morning.  If increased elevation is noted, then to proceed with an MRCP tomorrow to allow additional imaging, specifically of the hepatobiliary system. If MRCP does reveal positive findings, then to proceed with ERCP. N.p.o. status is to remain in place. Continue pain management as well as antibiotic therapy as ordered. Will continue to monitor.   These services provided by Payton Emerald, MS, APRN, [facility], FNP, under collaborative agreement with Lupita Dawn. Candace Cruise, MD.  ____________________________ Payton Emerald, NP dsh:jm D: 07/19/2012 14:03:17 ET T: 07/19/2012 16:15:27 ET JOB#: [id number]  cc: Payton Emerald, NP, <Dictator> Payton Emerald MD ELECTRONICALLY SIGNED 07/25/2012 16:11

## 2014-08-03 NOTE — Consult Note (Signed)
Pt hgb 9.1, no abd pain, no bleeding, no vomiting, no hematemesis.  Pt on iv solumedrol, still with exp wheezes.  He gets SOB walking to the bathroom from his bed.  If hgb drops below 9 consider transfusion due to his lung disease and oxygen transport.  Can change to oral PPI tomorrow twice a day.  Will follow with you.  Electronic Signatures: Manya Silvas (MD)  (Signed on 02-Jan-13 15:47)  Authored  Last Updated: 02-Jan-13 15:47 by Manya Silvas (MD)

## 2014-08-03 NOTE — Consult Note (Signed)
Pt looks better, chest exam without exp wheezes.  Walked a little further.  Hgb up to 9.6.  No abd pain or vomiting.  I will sign off.  He should go home on bid PPI and folow up with me in 4-6 weeks because we need to look in his stomach and make sure his gastric ulcer heals.  I will sign off, reconsult if needed.  Electronic Signatures: Manya Silvas (MD)  (Signed on 04-Jan-13 08:40)  Authored  Last Updated: 04-Jan-13 08:40 by Manya Silvas (MD)

## 2014-08-03 NOTE — Discharge Summary (Signed)
PATIENT NAME:  Matthew Miles, Matthew Miles MR#:  [id number] DATE OF BIRTH:  05-28-1928  DATE OF ADMISSION:  04/10/2011 DATE OF DISCHARGE:  04/15/2011  PRIMARY CARE PHYSICIAN:  Dr. Eliberto Ivory   GASTROENTEROLOGIST:  Dr. Vira Agar  FINAL DIAGNOSES:  1. Hematemesis with a gastric ulcer.  2. Chronic obstructive pulmonary disease exacerbation, chronic respiratory failure with hypoxia. Pulse oximetry dropped down to 83% on ambulation.  3. Tachycardia. 4. Nonsustained V. tach.  5. Acute renal failure.  6. Tobacco abuse.  7. Weakness.  8. Thrombocytopenia.   MEDICATIONS ON DISCHARGE:  1. Nicotine patch 7 mg chest wall daily.  2. Spiriva 1 inhalation daily.  3. Advair Diskus 250/50, 1 inhalation twice a day. 4. Ventolin 2 puffs every six hours if not using nebulizer. 5. Prilosec 20 mg p.o. twice a day. 6. Cardizem CD 120 mg p.o. daily.  7. Albuterol nebulizer every six hours.  8. Prednisone taper as written on prescription.   NOTE: Do not take aspirin or any NSAIDs.   OXYGEN: 2 liters nasal cannula.   DIET: Low sodium diet.   ACTIVITY: Activity as tolerated.   REFERRAL:  1. GI, Dr. Vira Agar in six weeks.  2. [place] [facility], Dr. Lake Bells in two weeks. 3. Dr. Calla Kicks after rehab.  FOLLOWUP:  Follow up in 1 to 2 days with the doctor at rehab.   REASON FOR ADMISSION: The patient was admitted 04/10/2011 and discharged 04/15/2011. Came in with vomiting blood.   HISTORY OF PRESENT ILLNESS: 81 year old man with history of chronic obstructive pulmonary disease, ongoing tobacco abuse, three bouts of antibiotic treatment for chronic obstructive pulmonary disease exacerbation/acute bronchitis. He started vomiting dark red blood. In the Emergency Room NG tube was inserted and Protonix drip started. Gastroenterology consultation was ordered. The patient initially was admitted to the Critical Care Unit.   LABORATORY, DIAGNOSTIC, AND RADIOLOGICAL DATA: EKG showed sinus tachycardia, left atrial enlargement, left  anterior fascicular block. INR 1. Ethanol level negative. Troponin negative. Glucose 157, BUN 28, creatinine 1.43, sodium 145, potassium 4.4, chloride 104, CO2 28, calcium 8.5. Liver function tests normal. White blood cell count 21.5, hemoglobin and hematocrit 13.4 and 39.7, platelet count 202. Chest x-ray showed no acute disease in the chest. Hemoglobin 10.4, 9.9, and 10.6 on the 30th. Urinalysis negative. Hemoglobin 9.6 on 04/11/2011, went down to 9.1 on 01/01. Helicobacter antibodies negative. Hemoglobin on 01/04 is 9.6.  HOSPITAL COURSE PER PROBLEM LIST:  1. Hematemesis: The patient had serial hemoglobins, started on Protonix drip, had an upper endoscopy that showed a gastric ulcer and hiatal hernia. The patient's hemoglobin was followed throughout the hospital stay. Hemoglobin upon discharge was 9.6. Omeprazole will be continued twice a day. Dr. Vira Agar would like to follow up the patient in about six weeks for a re-look to make sure the gastric ulcer is healing. Helicobacter antibody was negative. The patient is tolerating diet.  2. Chronic obstructive pulmonary disease exacerbation/chronic respiratory failure with hypoxia: He  will be sent home on oxygen to the rehab. Smoking cessation, and for his tobacco abuse a Nicotine patch applied. He will continue on Spiriva and Advair Diskus and nebulizers. He will be given a prednisone taper for his chronic obstructive pulmonary disease exacerbation. He finished  Levaquin high dose in the hospital.  3. Tachycardia: Heart rate goes up with ambulation. Cardizem CD was started, should help out with his exercise capacity.  4. Nonsustained V. tach: I believe secondary to the chronic obstructive pulmonary disease exacerbation and breathing issues.  5. Acute renal failure:  Creatinine upon discharge 1.1. It was 1.43 when he came in, most likely secondary to GI bleed. 6. Tobacco abuse: Smoking cessation was discussed. Nicotine patch prescribed. 7. Weakness: He will  go to rehab. 8. Thrombocytopenia: This had remained stable here in the hospital. Recommend checking a CBC in one week to check hemoglobin and platelets.   TIME SPENT ON DISCHARGE: 40 minutes.    ____________________________ Tana Conch. Leslye Peer, MD rjw:bjt D: 04/15/2011 09:56:16 ET T: 04/15/2011 10:26:29 ET JOB#: [id number]  cc: Tana Conch. Leslye Peer, MD, <Dictator> Manya Silvas, MD Dory Horn. Eliberto Ivory, MD Marisue Brooklyn MD ELECTRONICALLY SIGNED 04/27/2011 16:03

## 2014-08-03 NOTE — H&P (Signed)
PATIENT NAME:  Matthew Miles, ELTING MR#:  [id number] DATE OF BIRTH:  February 01, 1929  DATE OF ADMISSION:  04/10/2011  PRIMARY CARE PHYSICIAN: Calla Kicks, MD    CHIEF COMPLAINT: Vomiting blood.   HISTORY OF PRESENT ILLNESS: Matthew Miles is an 81 year old pleasant Caucasian male who has history of chronic obstructive pulmonary disease and ongoing smoking. Over the last one month he has had at least three bouts of antibiotic treatment for COPD exacerbation and acute bronchitis. Most recently he was placed on prednisone in addition to the antibiotic. The patient reported starting vomiting dark red blood that started at 6 p.m. on Saturday evening associated with some epigastric discomfort or pain. By the time he came to the Emergency Room and NG tube was inserted, his epigastric pain had subsided. NG tube drained so far about 75 mL of dark red blood.   REVIEW OF SYSTEMS: CONSTITUTIONAL: Denies any fever. No chills. No night sweats. No fatigue. EYES: No blurring of vision. No double vision. ENT: No hearing impairment. No sore throat. No dysphagia. No dizziness. No epistaxis. CARDIOVASCULAR: No chest pain. No worsening of shortness of breath apart from his baseline. He has minimal peripheral edema. No syncope. RESPIRATORY: He has residual cough from his recent bronchitis. No sputum production now. No shortness of breath. GASTROINTESTINAL: Admits having epigastric pain earlier but now has subsided. Also as above he has hematemesis. No melena or hematochezia so far. GENITOURINARY: No dysuria or frequency of urination. MUSCULOSKELETAL: No joint pain or joint swelling. No muscular swelling or pain. INTEGUMENTARY: No skin rash. No ulcers. NEUROLOGIC: No focal weakness. No seizure activity. No headache. PSYCHIATRY: No depression. No anxiety.   PAST MEDICAL HISTORY:  1. History of chronic obstructive pulmonary disease. 2. Ongoing tobacco abuse.  3. History of mild systemic hypertension.  4. History of  hypercholesterolemia. 5. Prior history of upper GI bleed operated in the 1980's.  6. History of appendectomy.   SOCIAL HABITS: Chronic smoker. Used to smoke 1 pack a day since age of 50. He had cut down recently to half a pack a day. No history of alcohol or drug abuse.   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 He lives at home alone. His wife currently lives at rest home.   ADMISSION MEDICATIONS:  1. Aspirin 81 mg a day. 2. Lipitor 40 mg a day.  3. The patient is unsure if he is taking blood thinner or not. He does not recall the name and he indicates it could be a blood pressure pill. He thinks it is 40 mg.   ALLERGIES: No known drug allergies, however, one of his relatives states that he will vomit if he receives sulfa medication.   PHYSICAL EXAMINATION:   VITAL SIGNS: Blood pressure 97/53, pulse 109. Subsequent blood pressure was 100/59 with a pulse of 90. His respiratory rate is 24, temperature 98, oxygen saturation 93%.   GENERAL APPEARANCE: Elderly male sitting on the stretcher in no acute distress.   HEAD AND NECK EXAMINATION: No pallor. No icterus. No cyanosis.   EARS, NOSE, AND THROAT: Hearing was normal. Nasal mucosa, lips, tongue were normal.   EYES: Normal eyelids and conjunctivae. Pupils about 4 mm, equal and reactive to light.   NECK: Supple. Trachea at midline. No thyromegaly. No cervical lymphadenopathy.   HEART: Normal S1, S2. No S3, no S4. No murmur. No gallop.   RESPIRATORY: Normal breathing pattern without use of accessory muscles. Decreased breathing sounds bilaterally. No rales. No wheezing. The AP diameter of the chest  is increased reflecting hyperinflation.   ABDOMEN: Soft without tenderness. No hepatosplenomegaly. No masses. No hernias.   SKIN: No ulcers. No subcutaneous nodules.   MUSCULOSKELETAL: No joint swelling. No clubbing.   NEUROLOGIC: Cranial nerves II through XII are intact. No focal motor deficit. Coordination movements were  normal.   PSYCHIATRIC: The patient is alert and oriented x3. Mood and affect were normal.   LABORATORY, DIAGNOSTIC, AND RADIOLOGICAL DATA: EKG showed sinus tachycardia at rate of 102 per minute, left axis deviation secondary to left anterior hemiblock.   CBC showed white count of 21,000, hemoglobin 13.4, hematocrit 39, platelet count 202. Total CPK 29. CK-MB fraction 2.6. Serum glucose 157, BUN 28, creatinine 1.4, sodium 145, potassium 4.4, calcium 8.5, AST 16, ALT 34, alkaline phosphatase 62, albumin 3.5, bilirubin 0.7. Troponin 0.03. Alcohol level less than 3. Prothrombin time 13.5. INR 1.   ASSESSMENT:  1. Hematemesis. Differential diagnosis includes peptic ulcer disease. This is likely since he has a prior history of bleeding ulcers and possibly the combination of the aspirin and recent treatment with the steroids and ongoing tobacco abuse are all culprits for his upper GI bleed. Differential diagnosis also includes AV or arterial venous malformation, gastritis, tumor formation, et Ronney Asters.  2. Significant leukocytosis. This is probably secondary to combination of recent steroid use in addition to the stress from the GI bleed.  3. Chronic obstructive pulmonary disease with recent multiple trials of treatment with antibiotic recently and also prednisone usage. The patient is improving regarding his respiratory status.  4. History of systemic hypertension.  5. History of hypercholesterolemia.  6. Past history of bleeding peptic ulcer disease operated in the early 1980's.  7. History of appendectomy.   PLAN:  1. Admit patient to the Intensive Care Unit. 2. Check hemoglobin and hematocrit q.8 hours.  3. Keep patient n.p.o.  4. Start IV Protonix using continuous drip.  5. NG tube was placed and so far drained about 75 mL of dark red blood.  6. GI consultation with Dr. Vira Agar who was on call. I already placed a call for him and I am awaiting his response just to alert him about this admission to  plan for upper endoscopy early in the morning.  7. I will hold his home medications for the time being.  8. I discussed his clinical picture with the patient and also with his family.   TIME SPENT EVALUATING THIS PATIENT: More than one hour.   ____________________________ Clovis Pu. Lenore Manner, MD amd:drc D: 04/10/2011 01:36:07 ET T: 04/10/2011 10:47:06 ET JOB#: [id number]  cc: Clovis Pu. Lenore Manner, MD, <Dictator> Dory Horn. Eliberto Ivory, MD Clovis Pu Bastrop MD ELECTRONICALLY SIGNED 04/12/2011 4:05

## 2014-08-03 NOTE — Consult Note (Signed)
Chief Complaint:   Subjective/Chief Complaint doing well no recurrent bleeding, denies n/v or abdominal pain.   VITAL SIGNS/ANCILLARY NOTES: **Vital Signs.:   01-Jan-13 08:30   Vital Signs Type Routine   Temperature Temperature (F) 97.5   Celsius 36.3   Temperature Source axillary   Pulse Pulse 108   Pulse source per Dinamap   Respirations Respirations 20   Systolic BP Systolic BP 953   Diastolic BP (mmHg) Diastolic BP (mmHg) 49   Mean BP 66   BP Source Dinamap   Pulse Ox % Pulse Ox % 96   Brief Assessment:   Cardiac Regular    Respiratory clear BS    Gastrointestinal details normal Soft  Nontender  Nondistended  No masses palpable  Bowel sounds normal   Routine Hem:  01-Jan-13 06:53    WBC (CBC) 9.2   RBC (CBC) 2.75   Hemoglobin (CBC) 9.1   Hematocrit (CBC) 26.4   Platelet Count (CBC) 110   MCV 96   MCH 33.0   MCHC 34.4   RDW 13.7   Neutrophil % 94.6   Lymphocyte % 4.6   Monocyte % 0.3   Eosinophil % 0.1   Basophil % 0.4   Neutrophil # 8.7   Lymphocyte # 0.4   Monocyte # 0.0   Eosinophil # 0.0   Basophil # 0.0   Assessment/Plan:  Assessment/Plan:   Assessment 1) acute upper GI bleed due to GU in the setting of remote gastric surgery for PUDz.  stable. tolerating clears.    Plan 1) recommend changing diet to full liquid in am then low residue the following day for a week.  Continue bid ppi.  Will need o/p GI fu with Dr Aurora Mask officein a couple weeks, probable repeat egd to assess healing. will check h. pylori serology. Dr Vira Agar to return tomorrow.   Electronic Signatures: Loistine Simas (MD)  (Signed 01-Jan-13 13:29)  Authored: Chief Complaint, VITAL SIGNS/ANCILLARY NOTES, Brief Assessment, Lab Results, Assessment/Plan   Last Updated: 01-Jan-13 13:29 by Loistine Simas (MD)

## 2014-08-03 NOTE — Consult Note (Signed)
Pt walked further today, breathing better today. No exp wheeze.  No bleeding and tol diet well.  Will check hgb in am.    Electronic Signatures: Manya Silvas (MD)  (Signed on 03-Jan-13 18:14)  Authored  Last Updated: 03-Jan-13 18:14 by Manya Silvas (MD)

## 2014-08-03 NOTE — Consult Note (Signed)
PATIENT NAME:  Matthew, Miles MR#:  [id number] DATE OF BIRTH:  [birth year]  DATE OF CONSULTATION:  04/10/2011  CONSULTING PHYSICIAN:  Manya Silvas, MD  REASON FOR CONSULTATION: The patient is an 81 year old white male who was at home and developed epigastric abdominal pain of significant intensity, and a couple of hours later he vomited blood. He had a short syncopal episode, struck his head on either a door frame or a door; and because of these problems he was brought to the ER where he was found to have an upper GI bleed with hematemesis. In the ER, an NG tube was placed and lavage and was later pulled. He was given antinausea medicine and Protonix and admitted to the hospital. I was asked to see him in consultation.   PAST MEDICAL HISTORY:  1. Ulcer surgery 30 years ago with a midline upper scar.  2. Chronic obstructive pulmonary disease, long history of smoking.  3. Coronary artery disease, on previous catheterization no stent required, collateral flow appeared to be adequate.  4. Prostate cancer with radiation treatments.  5. Hypercholesterolemia.   ALLERGIES: No known drug allergies.   MEDICATIONS ON ADMISSION:  1. Lipitor 40 mg a day.  2. Aspirin 325 mg a day.  3. He also uses inhalers and breathing treatments. He is not on oxygen at home   SOCIAL HISTORY: He smokes a pack a day all of his adult life, cut down to 1/2 pack a day recently. He does not drink alcohol. 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   REVIEW OF SYSTEMS: He never had a stroke, and never had a heart attack. He is on high blood pressure medicine, medicine unspecified. He has significant chronic obstructive pulmonary disease and recurrent bronchitis. He was placed on prednisone taper three weeks ago, and he has had a couple of different courses of antibiotics. One, which may have been a sulfa drug, did not agree with him at all and caused some nausea and vomiting.    PHYSICAL EXAMINATION:  GENERAL:  Elderly white male on oxygen and in no acute distress, able to carry on a conversation.   VITAL SIGNS: Blood pressure 103/53, pulse 100, temperature 98.2, oxygen saturation 96% on 1 liter.   HEENT: Sclerae are anicteric. Conjunctivae are negative. Tongue negative. Head is atraumatic. Trachea is in the midline.   CHEST: Globally decreased air flow anterior and lateral fields.   HEART: A 2/6 systolic murmur.   ABDOMEN: Bowel sounds are present. No hepatosplenomegaly. No masses. No bruits. No significant tenderness to the exam. No rebound tenderness. No tenderness with coughing. There is an old midline scar present. Lower extremities show 1 to 2+ edema.   NEUROLOGICAL:  The patient is alert and oriented, appropriate historian, also history is helped by his daughter.   LABORATORY, DIAGNOSTIC AND RADIOLOGICAL DATA:  Glucose 157, BUN 28, creatinine 1.43, sodium 145, potassium 4.4, chloride 104, CO2 28, calcium 8.5, albumin 3.5, total bilirubin 0.7, alkaline phosphatase 62, SGOT 16, SGPT 34.  CPK-MB is negative. Troponin negative.  White blood count 21.5, hemoglobin 13.4, today it is 10.4.  He has A+ blood with negative antibody screen.  Prothrombin is 13.5  Portable chest x-ray showed no acute disease of the chest.   ASSESSMENT: The patient is likely with a bleeding ulcer or erosive gastritis, ulcer could be gastric or duodenal or anastomotic junction. Likely a combination of aspirin plus prednisone may have contributed to the formation of the ulcer. Prednisone was needed for his lungs.  PLAN: Continue IV PPI. Check a serial hemoglobin. Continue treatment for his chronic obstructive pulmonary disease. He does have some mild renal insufficiency, slightly elevated creatinine and BUN. If his hemoglobin continues to drop, then likely would transfuse and proceed with his upper endoscopy today. If his hemoglobin is stable, would probably sit on him and scope him tomorrow. Either way, today or tomorrow  we will be doing an upper endoscopy on him.  ____________________________ Manya Silvas, MD rte:cbb D: 04/10/2011 10:18:55 ET T: 04/10/2011 10:58:38 ET JOB#: [id number]  cc: Manya Silvas, MD, <Dictator> Dory Horn. Eliberto Ivory, MD Manya Silvas MD ELECTRONICALLY SIGNED 04/21/2011 12:12

## 2014-08-07 DIAGNOSIS — L638 Other alopecia areata: Secondary | ICD-10-CM | POA: Diagnosis not present

## 2014-08-07 DIAGNOSIS — L659 Nonscarring hair loss, unspecified: Secondary | ICD-10-CM | POA: Diagnosis not present

## 2014-09-18 DIAGNOSIS — L638 Other alopecia areata: Secondary | ICD-10-CM | POA: Diagnosis not present

## 2014-10-21 ENCOUNTER — Other Ambulatory Visit: Payer: Self-pay | Admitting: *Deleted

## 2014-10-21 ENCOUNTER — Ambulatory Visit (INDEPENDENT_AMBULATORY_CARE_PROVIDER_SITE_OTHER): Payer: Commercial Managed Care - HMO | Admitting: Internal Medicine

## 2014-10-21 ENCOUNTER — Encounter: Payer: Self-pay | Admitting: Internal Medicine

## 2014-10-21 VITALS — BP 138/62 | HR 80 | Temp 98.0°F | Ht 63.0 in | Wt 189.4 lb

## 2014-10-21 DIAGNOSIS — E13319 Other specified diabetes mellitus with unspecified diabetic retinopathy without macular edema: Secondary | ICD-10-CM | POA: Diagnosis not present

## 2014-10-21 DIAGNOSIS — E785 Hyperlipidemia, unspecified: Secondary | ICD-10-CM | POA: Diagnosis not present

## 2014-10-21 DIAGNOSIS — I10 Essential (primary) hypertension: Secondary | ICD-10-CM

## 2014-10-21 DIAGNOSIS — H2513 Age-related nuclear cataract, bilateral: Secondary | ICD-10-CM | POA: Diagnosis not present

## 2014-10-21 DIAGNOSIS — IMO0002 Reserved for concepts with insufficient information to code with codable children: Secondary | ICD-10-CM

## 2014-10-21 DIAGNOSIS — E669 Obesity, unspecified: Secondary | ICD-10-CM | POA: Diagnosis not present

## 2014-10-21 DIAGNOSIS — E1165 Type 2 diabetes mellitus with hyperglycemia: Secondary | ICD-10-CM | POA: Diagnosis not present

## 2014-10-21 LAB — HEMOGLOBIN A1C: Hgb A1c MFr Bld: 7.2 % — ABNORMAL HIGH (ref 4.6–6.5)

## 2014-10-21 LAB — COMPREHENSIVE METABOLIC PANEL
ALBUMIN: 4 g/dL (ref 3.5–5.2)
ALT: 15 U/L (ref 0–53)
AST: 16 U/L (ref 0–37)
Alkaline Phosphatase: 67 U/L (ref 39–117)
BUN: 22 mg/dL (ref 6–23)
CALCIUM: 8.8 mg/dL (ref 8.4–10.5)
CO2: 26 mEq/L (ref 19–32)
Chloride: 103 mEq/L (ref 96–112)
Creatinine, Ser: 1.33 mg/dL (ref 0.40–1.50)
GFR: 54.46 mL/min — AB (ref >60.00)
Glucose, Bld: 176 mg/dL — ABNORMAL HIGH (ref 70–99)
Potassium: 4.1 mEq/L (ref 3.5–5.1)
Sodium: 137 mEq/L (ref 135–145)
Total Bilirubin: 0.5 mg/dL (ref 0.2–1.2)
Total Protein: 6.2 g/dL (ref 6.0–8.3)

## 2014-10-21 LAB — MICROALBUMIN / CREATININE URINE RATIO
Creatinine,U: 76.8 mg/dL
Microalb Creat Ratio: 0.9 mg/g (ref 0.0–30.0)
Microalb, Ur: <0.7 mg/dL (ref 0.0–1.9)

## 2014-10-21 LAB — HM DIABETES EYE EXAM

## 2014-10-21 LAB — LIPID PANEL
CHOL/HDL RATIO: 4
CHOLESTEROL: 125 mg/dL (ref 0–200)
HDL: 31 mg/dL — AB (ref >39.00)
LDL Cholesterol: 54 mg/dL (ref 0–99)
NONHDL: 94
Triglycerides: 200 mg/dL — ABNORMAL HIGH (ref 0.0–149.0)
VLDL: 40 mg/dL (ref 0.0–40.0)

## 2014-10-21 MED ORDER — BLOOD GLUCOSE MONITORING SUPPL W/DEVICE KIT: PACK | Status: DC

## 2014-10-21 MED ORDER — GLUCOSE BLOOD VI STRP
ORAL_STRIP | Status: DC
Start: 2014-10-21 — End: 2015-08-05

## 2014-10-21 NOTE — Patient Instructions (Signed)
Congratulations on your weight loss!!  Keep up the good work!  We will check labs today.

## 2014-10-21 NOTE — Progress Notes (Signed)
Pre visit review using our clinic review tool, if applicable. No additional management support is needed unless otherwise documented below in the visit note. 

## 2014-10-21 NOTE — Assessment & Plan Note (Signed)
Will check lipids with labs today.  

## 2014-10-21 NOTE — Assessment & Plan Note (Signed)
Wt Readings from Last 3 Encounters:  10/21/14 189 lb 6 oz (85.9 kg)  07/21/14 195 lb 4 oz (88.565 kg)  06/05/14 191 lb 8 oz (86.864 kg)  Body mass index is 33.55 kg/(m^2).  Encouraged continued healthy diet and exercise. Congratulated pt on weight loss.

## 2014-10-21 NOTE — Assessment & Plan Note (Signed)
BP Readings from Last 3 Encounters:  10/21/14 138/62  07/21/14 153/65  06/05/14 140/60   BP well controlled. Continue current medications. Renal function with labs today.

## 2014-10-21 NOTE — Assessment & Plan Note (Signed)
Will check A1c with labs today. Continue Glipizide. 

## 2014-10-21 NOTE — Progress Notes (Signed)
Subjective:    Patient ID: Matthew Blank., male    DOB: 1928/04/25, 81 y.o.   MRN: [id number]  HPI  81YO male presents for follow up.  DM - BG have been less than 200. Mostly near 140. Typically 130-170. Compliant with medications.  Obesity - Limiting caloric intake. Limiting intake of sugars. Trying to lose weight.   Wt Readings from Last 3 Encounters:  10/21/14 189 lb 6 oz (85.9 kg)  07/21/14 195 lb 4 oz (88.565 kg)  06/05/14 191 lb 8 oz (86.864 kg)     Past medical, surgical, family and social history per today's encounter.  Review of Systems  Constitutional: Negative for fever, chills, activity change, appetite change, fatigue and unexpected weight change.  Eyes: Negative for visual disturbance.  Respiratory: Negative for cough and shortness of breath.   Cardiovascular: Negative for chest pain, palpitations and leg swelling.  Gastrointestinal: Negative for nausea, vomiting, abdominal pain, diarrhea, constipation and abdominal distention.  Genitourinary: Negative for dysuria, urgency and difficulty urinating.  Musculoskeletal: Negative for arthralgias and gait problem.  Skin: Negative for color change and rash.  Hematological: Negative for adenopathy.  Psychiatric/Behavioral: Negative for sleep disturbance and dysphoric mood. The patient is not nervous/anxious.        Objective:    BP 138/62 mmHg  Pulse 80  Temp(Src) 98 F (36.7 C) (Oral)  Ht 5\' 3"  (1.6 m)  Wt 189 lb 6 oz (85.9 kg)  BMI 33.55 kg/m2  SpO2 95% Physical Exam  Constitutional: He is oriented to person, place, and time. He appears well-developed and well-nourished. No distress.  HENT:  Head: Normocephalic and atraumatic.  Right Ear: External ear normal.  Left Ear: External ear normal.  Nose: Nose normal.  Mouth/Throat: Oropharynx is clear and moist. No oropharyngeal exudate.  Eyes: Conjunctivae and EOM are normal. Pupils are equal, round, and reactive to light. Right eye exhibits no discharge.  Left eye exhibits no discharge. No scleral icterus.  Neck: Normal range of motion. Neck supple. No tracheal deviation present. No thyromegaly present.  Cardiovascular: Normal rate and regular rhythm.  Exam reveals no gallop and no friction rub.   Murmur heard. Pulmonary/Chest: Effort normal and breath sounds normal. No accessory muscle usage. No tachypnea. No respiratory distress. He has no decreased breath sounds. He has no wheezes. He has no rhonchi. He has no rales. He exhibits no tenderness.  Musculoskeletal: Normal range of motion. He exhibits no edema.  Lymphadenopathy:    He has no cervical adenopathy.  Neurological: He is alert and oriented to person, place, and time. No cranial nerve deficit. Coordination normal.  Skin: Skin is warm and dry. No rash noted. He is not diaphoretic. No erythema. No pallor.  Psychiatric: He has a normal mood and affect. His behavior is normal. Judgment and thought content normal.          Assessment & Plan:   Problem List Items Addressed This Visit      Unprioritized   Diabetes mellitus type 2, uncontrolled - Primary    Will check A1c with labs today. Continue Glipizide.      Relevant Orders   Comprehensive metabolic panel   Hemoglobin A1c   Lipid panel   Microalbumin / creatinine urine ratio   Essential hypertension    BP Readings from Last 3 Encounters:  10/21/14 138/62  07/21/14 153/65  06/05/14 140/60   BP well controlled. Continue current medications. Renal function with labs today.      Hyperlipidemia  Will check lipids with labs today.      Obesity (BMI 30-39.9)    Wt Readings from Last 3 Encounters:  10/21/14 189 lb 6 oz (85.9 kg)  07/21/14 195 lb 4 oz (88.565 kg)  06/05/14 191 lb 8 oz (86.864 kg)  Body mass index is 33.55 kg/(m^2).  Encouraged continued healthy diet and exercise. Congratulated pt on weight loss.          Return in about 3 months (around 01/21/2015) for Recheck of Diabetes.

## 2014-10-30 DIAGNOSIS — L639 Alopecia areata, unspecified: Secondary | ICD-10-CM | POA: Diagnosis not present

## 2014-11-24 ENCOUNTER — Encounter: Payer: Self-pay | Admitting: Internal Medicine

## 2015-01-22 ENCOUNTER — Encounter: Payer: Self-pay | Admitting: Internal Medicine

## 2015-01-22 ENCOUNTER — Ambulatory Visit (INDEPENDENT_AMBULATORY_CARE_PROVIDER_SITE_OTHER): Payer: Commercial Managed Care - HMO | Admitting: Internal Medicine

## 2015-01-22 VITALS — BP 133/64 | HR 89 | Temp 99.0°F | Ht 63.0 in | Wt 192.1 lb

## 2015-01-22 DIAGNOSIS — Z23 Encounter for immunization: Secondary | ICD-10-CM | POA: Diagnosis not present

## 2015-01-22 DIAGNOSIS — I10 Essential (primary) hypertension: Secondary | ICD-10-CM

## 2015-01-22 DIAGNOSIS — IMO0001 Reserved for inherently not codable concepts without codable children: Secondary | ICD-10-CM

## 2015-01-22 DIAGNOSIS — E1165 Type 2 diabetes mellitus with hyperglycemia: Secondary | ICD-10-CM | POA: Diagnosis not present

## 2015-01-22 DIAGNOSIS — Z8546 Personal history of malignant neoplasm of prostate: Secondary | ICD-10-CM

## 2015-01-22 DIAGNOSIS — J449 Chronic obstructive pulmonary disease, unspecified: Secondary | ICD-10-CM

## 2015-01-22 DIAGNOSIS — D696 Thrombocytopenia, unspecified: Secondary | ICD-10-CM | POA: Diagnosis not present

## 2015-01-22 LAB — CBC WITH DIFFERENTIAL/PLATELET
BASOS ABS: 0 10*3/uL (ref 0.0–0.1)
Basophils Relative: 0.5 % (ref 0.0–3.0)
EOS PCT: 1.1 % (ref 0.0–5.0)
Eosinophils Absolute: 0.1 10*3/uL (ref 0.0–0.7)
HCT: 36.1 % — ABNORMAL LOW (ref 39.0–52.0)
HEMOGLOBIN: 12 g/dL — AB (ref 13.0–17.0)
LYMPHS PCT: 21.9 % (ref 12.0–46.0)
Lymphs Abs: 1.6 10*3/uL (ref 0.7–4.0)
MCHC: 33.3 g/dL (ref 30.0–36.0)
MCV: 88.2 fl (ref 78.0–100.0)
MONOS PCT: 10.7 % (ref 3.0–12.0)
Monocytes Absolute: 0.8 10*3/uL (ref 0.1–1.0)
Neutro Abs: 4.9 10*3/uL (ref 1.4–7.7)
Neutrophils Relative %: 65.8 % (ref 43.0–77.0)
Platelets: 123 10*3/uL — ABNORMAL LOW (ref 150.0–400.0)
RBC: 4.09 Mil/uL — AB (ref 4.22–5.81)
RDW: 14.9 % (ref 11.5–15.5)
WBC: 7.4 10*3/uL (ref 4.0–10.5)

## 2015-01-22 LAB — COMPREHENSIVE METABOLIC PANEL
ALBUMIN: 4.2 g/dL (ref 3.5–5.2)
ALT: 12 U/L (ref 0–53)
AST: 13 U/L (ref 0–37)
Alkaline Phosphatase: 71 U/L (ref 39–117)
BUN: 23 mg/dL (ref 6–23)
CHLORIDE: 102 meq/L (ref 96–112)
CO2: 29 meq/L (ref 19–32)
Calcium: 9.2 mg/dL (ref 8.4–10.5)
Creatinine, Ser: 1.22 mg/dL (ref 0.40–1.50)
GFR: 60.13 mL/min (ref >60.00)
Glucose, Bld: 147 mg/dL — ABNORMAL HIGH (ref 70–99)
Potassium: 4.5 mEq/L (ref 3.5–5.1)
SODIUM: 139 meq/L (ref 135–145)
Total Bilirubin: 0.6 mg/dL (ref 0.2–1.2)
Total Protein: 6.5 g/dL (ref 6.0–8.3)

## 2015-01-22 LAB — LIPID PANEL
Cholesterol: 131 mg/dL (ref 0–200)
HDL: 32.2 mg/dL — AB (ref >39.00)
LDL Cholesterol: 68 mg/dL (ref 0–99)
NonHDL: 98.89
TRIGLYCERIDES: 154 mg/dL — AB (ref 0.0–149.0)
Total CHOL/HDL Ratio: 4
VLDL: 30.8 mg/dL (ref 0.0–40.0)

## 2015-01-22 LAB — HEMOGLOBIN A1C: Hgb A1c MFr Bld: 7.8 % — ABNORMAL HIGH (ref 4.6–6.5)

## 2015-01-22 LAB — MICROALBUMIN / CREATININE URINE RATIO
CREATININE, U: 65.3 mg/dL
MICROALB/CREAT RATIO: 1.1 mg/g (ref 0.0–30.0)
Microalb, Ur: <0.7 mg/dL (ref 0.0–1.9)

## 2015-01-22 NOTE — Progress Notes (Signed)
Pre visit review using our clinic review tool, if applicable. No additional management support is needed unless otherwise documented below in the visit note. 

## 2015-01-22 NOTE — Assessment & Plan Note (Signed)
Repeat CBC with labs today. 

## 2015-01-22 NOTE — Assessment & Plan Note (Signed)
Repeat A1c with labs today. Continue current medications.

## 2015-01-22 NOTE — Assessment & Plan Note (Signed)
BP Readings from Last 3 Encounters:  10/21/14 138/62  07/21/14 153/65  06/05/14 140/60   BP well controlled. Renal function with labs. Continue current medications.

## 2015-01-22 NOTE — Assessment & Plan Note (Signed)
Symptomatically, doing well.

## 2015-01-22 NOTE — Progress Notes (Signed)
Subjective:    Patient ID: Matthew Blank., male    DOB: Oct 17, 1928, 81 y.o.   MRN: [id number]  HPI  81YO male presents for follow up.  DM - BG have been higher. Some near 160-170. Compliant with medication. Notes some dietary indiscretion. Trying to limit carbohydrates.   HTN - Does not check BP. No HA, CP, palpitations.  COPD - Compliant with medications. No recent cough, chest pain, dyspnea.  Wt Readings from Last 3 Encounters:  01/22/15 192 lb 2 oz (87.147 kg)  10/21/14 189 lb 6 oz (85.9 kg)  07/21/14 195 lb 4 oz (88.565 kg)   BP Readings from Last 3 Encounters:  01/22/15 133/64  10/21/14 138/62  07/21/14 153/65    Past Medical History  Diagnosis Date  . COPD (chronic obstructive pulmonary disease) ([place])   . Smoker   . Mild hypertension   . Hypercholesterolemia   . Upper GI bleed 1980  . Hematemesis/vomiting blood 04/10/11  . Prostate cancer ([place])   . Prostate cancer [facility])     radiation therapy   . Coronary artery disease   . Heart murmur   . Arthritis   . Chicken pox   . Emphysema of lung ([place])   . GERD (gastroesophageal reflux disease)   . Ulcer   . Allergy   . Colon polyps   . Cholecystitis   . Pancreatitis   . CHF (congestive heart failure) ([facility])    Family History  Problem Relation Age of Onset  . Emphysema Sister     smoker  . Prostate cancer Father   . Liver cancer Brother   . Prostate cancer Brother    Past Surgical History  Procedure Laterality Date  . Appendectomy    . Stomach surgery      bleeding ulcers, followed by Dr. [name]  . Cardiac catheterization  May 2002 and Feb 2013    [facility]; no stents   . Circumcision    . Sp cholecystomy     Social History   Social History  . Marital Status: Married    Spouse Name: N/A  . Number of Children: 2  . Years of Education: N/A   Occupational History  . 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     Social History Main Topics  . Smoking status: Former Smoker -- 1.00 packs/day for 65 years   Types: Cigarettes    Quit date: 04/10/2011  . Smokeless tobacco: Former Systems developer    Types: Chew  . Alcohol Use: No  . Drug Use: No  . Sexual Activity: Not Asked   Other Topics Concern  . None   Social History Narrative   Lives in [place] alone. Wife in nursing home. Has 2 children.      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  Eyes: Negative for visual disturbance.  Respiratory: Negative for cough and shortness of breath.   Cardiovascular: Negative for chest pain, palpitations and leg swelling.  Gastrointestinal: Negative for nausea, vomiting, abdominal pain, diarrhea, constipation and abdominal distention.  Genitourinary: Negative for dysuria, urgency and difficulty urinating.  Musculoskeletal: Negative for myalgias, arthralgias and gait problem.  Skin: Negative for color change and rash.  Hematological: Negative for adenopathy.  Psychiatric/Behavioral: Negative for sleep disturbance and dysphoric mood. The patient is not nervous/anxious.  Objective:    BP 133/64 mmHg  Pulse 89  Temp(Src) 99 F (37.2 C) (Oral)  Ht 5\' 3"  (1.6 m)  Wt 192 lb 2 oz (87.147 kg)  BMI 34.04 kg/m2  SpO2 98% Physical Exam  Constitutional: He is oriented to person, place, and time. He appears well-developed and well-nourished. No distress.  HENT:  Head: Normocephalic and atraumatic.  Right Ear: External ear normal.  Left Ear: External ear normal.  Nose: Nose normal.  Mouth/Throat: Oropharynx is clear and moist. No oropharyngeal exudate.  Eyes: Conjunctivae and EOM are normal. Pupils are equal, round, and reactive to light. Right eye exhibits no discharge. Left eye exhibits no discharge. No scleral icterus.  Neck: Normal range of motion. Neck supple. No tracheal deviation present. No thyromegaly present.    Cardiovascular: Normal rate, regular rhythm and normal heart sounds.  Exam reveals no gallop and no friction rub.   No murmur heard. Pulmonary/Chest: Effort normal and breath sounds normal. No accessory muscle usage. No tachypnea. No respiratory distress. He has no decreased breath sounds. He has no wheezes. He has no rhonchi. He has no rales. He exhibits no tenderness.  Musculoskeletal: Normal range of motion. He exhibits no edema.  Lymphadenopathy:    He has no cervical adenopathy.  Neurological: He is alert and oriented to person, place, and time. No cranial nerve deficit. Coordination normal.  Skin: Skin is warm and dry. No rash noted. He is not diaphoretic. No erythema. No pallor.  Psychiatric: He has a normal mood and affect. His behavior is normal. Judgment and thought content normal.          Assessment & Plan:   Problem List Items Addressed This Visit      Unprioritized   COPD (chronic obstructive pulmonary disease) (HCC) (Chronic)    Symptomatically, doing well.      Diabetes mellitus type 2, uncontrolled ([place]) - Primary (Chronic)    Repeat A1c with labs today. Continue current medications.      Relevant Orders   Comprehensive metabolic panel   Hemoglobin A1c   Lipid panel   Microalbumin / creatinine urine ratio   Essential hypertension    BP Readings from Last 3 Encounters:  10/21/14 138/62  07/21/14 153/65  06/05/14 140/60   BP well controlled. Renal function with labs. Continue current medications.      H/O malignant neoplasm of prostate (Chronic)    Lab Results  Component Value Date   PSA 0.93 07/11/2012   Continue yearly PSA and follow up with Urology.      Thrombocytopenia, unspecified (HCC) (Chronic)    Repeat CBC with labs today.      Relevant Orders   CBC with Differential/Platelet       Return in about 3 months (around 04/24/2015) for Recheck of Diabetes.

## 2015-01-22 NOTE — Assessment & Plan Note (Signed)
Lab Results  Component Value Date   PSA 0.93 07/11/2012   Continue yearly PSA and follow up with Urology.

## 2015-01-22 NOTE — Patient Instructions (Signed)
Labs today.   Follow up in 3 months.  

## 2015-01-26 ENCOUNTER — Telehealth: Payer: Self-pay

## 2015-01-26 DIAGNOSIS — D649 Anemia, unspecified: Secondary | ICD-10-CM

## 2015-01-26 NOTE — Addendum Note (Signed)
Addended by: Ronette Deter A on: 01/26/2015 08:23 PM   Modules accepted: Orders

## 2015-01-26 NOTE — Telephone Encounter (Signed)
Spoke with the patient, labs have not been done for ferrtin or B12, Patient is willing to come in to have them done tomorrow, Please order.  Thanks

## 2015-01-27 ENCOUNTER — Other Ambulatory Visit (INDEPENDENT_AMBULATORY_CARE_PROVIDER_SITE_OTHER): Payer: Commercial Managed Care - HMO

## 2015-01-27 ENCOUNTER — Other Ambulatory Visit: Payer: Self-pay | Admitting: Internal Medicine

## 2015-01-27 DIAGNOSIS — D649 Anemia, unspecified: Secondary | ICD-10-CM | POA: Diagnosis not present

## 2015-01-27 LAB — VITAMIN B12: Vitamin B-12: 705 pg/mL (ref 211–911)

## 2015-01-27 LAB — FERRITIN: FERRITIN: 27.7 ng/mL (ref 22.0–322.0)

## 2015-02-04 DIAGNOSIS — L659 Nonscarring hair loss, unspecified: Secondary | ICD-10-CM | POA: Diagnosis not present

## 2015-02-04 DIAGNOSIS — L638 Other alopecia areata: Secondary | ICD-10-CM | POA: Diagnosis not present

## 2015-03-18 ENCOUNTER — Ambulatory Visit (INDEPENDENT_AMBULATORY_CARE_PROVIDER_SITE_OTHER): Payer: Commercial Managed Care - HMO | Admitting: Cardiovascular Disease

## 2015-03-18 ENCOUNTER — Encounter: Payer: Self-pay | Admitting: Cardiovascular Disease

## 2015-03-18 VITALS — BP 140/58 | HR 84 | Ht 63.0 in | Wt 191.8 lb

## 2015-03-18 DIAGNOSIS — E1165 Type 2 diabetes mellitus with hyperglycemia: Secondary | ICD-10-CM

## 2015-03-18 DIAGNOSIS — I272 Other secondary pulmonary hypertension: Secondary | ICD-10-CM

## 2015-03-18 DIAGNOSIS — E669 Obesity, unspecified: Secondary | ICD-10-CM

## 2015-03-18 DIAGNOSIS — I2510 Atherosclerotic heart disease of native coronary artery without angina pectoris: Secondary | ICD-10-CM

## 2015-03-18 DIAGNOSIS — IMO0001 Reserved for inherently not codable concepts without codable children: Secondary | ICD-10-CM

## 2015-03-18 DIAGNOSIS — E785 Hyperlipidemia, unspecified: Secondary | ICD-10-CM

## 2015-03-18 DIAGNOSIS — J449 Chronic obstructive pulmonary disease, unspecified: Secondary | ICD-10-CM

## 2015-03-18 DIAGNOSIS — R011 Cardiac murmur, unspecified: Secondary | ICD-10-CM

## 2015-03-18 DIAGNOSIS — I10 Essential (primary) hypertension: Secondary | ICD-10-CM | POA: Diagnosis not present

## 2015-03-18 DIAGNOSIS — I2583 Coronary atherosclerosis due to lipid rich plaque: Secondary | ICD-10-CM

## 2015-03-18 NOTE — Assessment & Plan Note (Signed)
60 years of smoking, currently not on oxygen He reports he will walk short distances then recover his breathing

## 2015-03-18 NOTE — Progress Notes (Signed)
Patient ID: Matthew Blank., male    DOB: 11/05/1928, 81 y.o.   MRN: [id number]  HPI Comments: Matthew Miles is a 81  yo-old gentleman with history of coronary artery disease (occluded LAD), moderate pulmonary hypertension in early 2013, long history of smoking for 60 years, history of bronchitis requiring several courses of antibiotics at the end of 2012, prednisone and nebulizers with admission to the hospital in December for hematemesis and gastric ulcer seen on EGD, found to have COPD and oxygenation down to 83% with ambulation, nonsustained VT per the notes, acute renal failure who presents for followup  Of his coronary artery disease Ejection fraction 55% in 2015  In follow-up today, he reports that he is doing well. He does report having shortness of breath on exertion, stable Stop smoking for years ago, did smoke for 60 years Trying to cut back on his bread, hemoglobin A1c down to 7.8, was 7.4 over the summer. Started eating more tomato sandwich Denies any chest discomfort concerning for angina. 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 Continues on torsemide 20 mg in the morning, 10 mg in the evening Has chronic mild leg edema  EKG on today's visit shows normal sinus rhythm with rate 84 bpm, rare APC, left anterior fascicular block, old anterior MI  Other past medical history Chronic low back pain Reports that he had a bleeding ulcer 4 years ago at that time stop smoking Last echocardiogram in 2015 showing normal ejection fraction, right heart pressures 37 mmHg   left and right heart cath done in February 2013 showing pulmonary hypertension, stable severe coronary artery disease  Normal ejection fraction  Right heart catheterization showed significant fluid overload with wedge pressure of 24, moderate pulmonary hypertension. Catheterization showed occluded LAD which was old, no other significant stenoses requiring intervention.     Allergies  Allergen Reactions  . Sulfa  Antibiotics     GI upset  . Tradjenta [Linagliptin]     Hair loss    Outpatient Encounter Prescriptions as of 03/18/2015  Medication Sig  . aspirin 81 MG tablet Take 81 mg by mouth daily.  . Blood Glucose Monitoring Suppl W/DEVICE KIT Accu-chek Aviva Plus. Use as directed to check blood sugar two times a day. Dx. E11.65  . carvedilol (COREG) 3.125 MG tablet TAKE 1 TABLET BY MOUTH 2 TIMES A DAY  . Cyanocobalamin (B-12) 100 MCG TABS Take by mouth daily.  . cyclobenzaprine (FLEXERIL) 5 MG tablet Take 1 tablet (5 mg total) by mouth 3 (three) times daily as needed for muscle spasms.  Marland Kitchen glipiZIDE (GLUCOTROL XL) 10 MG 24 hr tablet TAKE 1 TABLET TWICE DAILY  . glucose blood (ACCU-CHEK AVIVA) test strip Use as instructed, Accu-Chek Aviva Plus strips. Use to check blood sugar two times a day. Dx. E11.65  . glucose blood test strip One touch ultra test strips. Use to check blood sugar two times a day. Dx. 250.00  . nitroGLYCERIN (NITROSTAT) 0.4 MG SL tablet Place 1 tablet (0.4 mg total) under the tongue every 5 (five) minutes as needed.  Marland Kitchen omeprazole (PRILOSEC) 20 MG capsule TAKE 1 CAPSULE TWICE DAILY BEFORE MEALS  . potassium chloride SA (K-DUR,KLOR-CON) 20 MEQ tablet TAKE 1 TABLET EVERY DAY  . pravastatin (PRAVACHOL) 40 MG tablet TAKE 1 TABLET EVERY DAY  . torsemide (DEMADEX) 10 MG tablet Take 1 tablet (10 mg total) by mouth 2 (two) times daily as needed.  . [DISCONTINUED] fluorometholone (EFLONE) 0.1 % ophthalmic suspension 1 drop 4 (four) times daily.  . [  DISCONTINUED] fluorometholone (FML) 0.1 % ophthalmic suspension   . [DISCONTINUED] gentamicin ointment (GARAMYCIN) 0.1 % Apply 1 application topically 3 (three) times daily. (Patient not taking: Reported on 03/18/2015)  . [DISCONTINUED] nystatin-triamcinolone (MYCOLOG II) cream    No facility-administered encounter medications on file as of 03/18/2015.    Past Medical History  Diagnosis Date  . COPD (chronic obstructive pulmonary disease) (HCC)    . Smoker   . Mild hypertension   . Hypercholesterolemia   . Upper GI bleed 1980  . Hematemesis/vomiting blood 04/10/11  . Prostate cancer ([facility])   . Prostate cancer [facility])     radiation therapy   . Coronary artery disease   . Heart murmur   . Arthritis   . Chicken pox   . Emphysema of lung ([facility])   . GERD (gastroesophageal reflux disease)   . Ulcer   . Allergy   . Colon polyps   . Cholecystitis   . Pancreatitis   . CHF (congestive heart failure) [facility])     Past Surgical History  Procedure Laterality Date  . Appendectomy    . Stomach surgery      bleeding ulcers, followed by Dr. Markham Jordan  . Cardiac catheterization  May 2002 and Feb 2013    [facility]; no stents   . Circumcision    . Sp cholecystomy      Social History  reports that he quit smoking about 3 years ago. His smoking use included Cigarettes. He has a 65 pack-year smoking history. He has quit using smokeless tobacco. His smokeless tobacco use included Chew. He reports that he does not drink alcohol or use illicit drugs.  Family History family history includes Emphysema in his sister; Liver cancer in his brother; Prostate cancer in his brother and father.   Review of Systems  Constitutional: Negative.   Respiratory: Positive for shortness of breath.   Cardiovascular: Negative.   Gastrointestinal: Negative.   Musculoskeletal: Negative.   Neurological: Negative.   Hematological: Negative.   Psychiatric/Behavioral: Negative.   All other systems reviewed and are negative.  BP 140/58 mmHg  Pulse 84  Ht 5\' 3"  (1.6 m)  Wt 191 lb 12 oz (86.977 kg)  BMI 33.98 kg/m2  Physical Exam  Constitutional: He is oriented to person, place, and time. He appears well-developed and well-nourished.  HENT:  Head: Normocephalic.  Nose: Nose normal.  Mouth/Throat: Oropharynx is clear and moist.  Eyes: Conjunctivae are normal. Pupils are equal, round, and reactive to light.  Neck: Normal range of motion. Neck supple. No JVD  present.  Cardiovascular: Normal rate, regular rhythm, S1 normal, S2 normal and intact distal pulses.  Frequent extrasystoles are present. Exam reveals no gallop and no friction rub.   Murmur heard.  Crescendo systolic murmur is present with a grade of 2/6  Trace edema around the ankles  Pulmonary/Chest: Effort normal. No respiratory distress. He has decreased breath sounds. He has no wheezes. He has no rales. He exhibits no tenderness.  Abdominal: Soft. Bowel sounds are normal. He exhibits no distension. There is no tenderness.  Musculoskeletal: Normal range of motion. He exhibits no edema or tenderness.  Lymphadenopathy:    He has no cervical adenopathy.  Neurological: He is alert and oriented to person, place, and time. Coordination normal.  Skin: Skin is warm and dry. No rash noted. No erythema.  Psychiatric: He has a normal mood and affect. His behavior is normal. Judgment and thought content normal.      Assessment and Plan  Nursing note and vitals reviewed.

## 2015-03-18 NOTE — Assessment & Plan Note (Signed)
Discussed his diet with him. Recommended a low carbohydrate diet. He has been eating more sandwich

## 2015-03-18 NOTE — Assessment & Plan Note (Signed)
Blood pressure is well controlled on today's visit. No changes made to the medications. 

## 2015-03-18 NOTE — Assessment & Plan Note (Signed)
Cholesterol is at goal on the current lipid regimen. No changes to the medications were made.  

## 2015-03-18 NOTE — Assessment & Plan Note (Signed)
Weight is stable, minimal leg edema, compliant with his torsemide, stable lab work, normal renal function and electrolytes. Continued chronic  moderate shortness of breath worse on exertion, unchanged

## 2015-03-18 NOTE — Patient Instructions (Signed)
You are doing well. No medication changes were made.  Please call us if you have new issues that need to be addressed before your next appt.  Your physician wants you to follow-up in: 6 months.  You will receive a reminder letter in the mail two months in advance. If you don't receive a letter, please call our office to schedule the follow-up appointment.   

## 2015-03-18 NOTE — Assessment & Plan Note (Signed)
Murmur appreciated on exam, 2/6, likely aortic valve sclerosis

## 2015-03-18 NOTE — Assessment & Plan Note (Signed)
Currently with no symptoms of angina. No further workup at this time. Continue current medication regimen.We have stressed the importance of aggressive diabetes control

## 2015-03-18 NOTE — Assessment & Plan Note (Signed)
Recommended a regular walking program, weight loss through diet changes

## 2015-04-27 ENCOUNTER — Encounter: Payer: Self-pay | Admitting: Internal Medicine

## 2015-04-27 ENCOUNTER — Ambulatory Visit (INDEPENDENT_AMBULATORY_CARE_PROVIDER_SITE_OTHER): Payer: PPO | Admitting: Internal Medicine

## 2015-04-27 VITALS — BP 137/55 | HR 81 | Temp 97.8°F | Ht 63.0 in | Wt 196.1 lb

## 2015-04-27 DIAGNOSIS — IMO0001 Reserved for inherently not codable concepts without codable children: Secondary | ICD-10-CM

## 2015-04-27 DIAGNOSIS — D696 Thrombocytopenia, unspecified: Secondary | ICD-10-CM

## 2015-04-27 DIAGNOSIS — J449 Chronic obstructive pulmonary disease, unspecified: Secondary | ICD-10-CM

## 2015-04-27 DIAGNOSIS — I10 Essential (primary) hypertension: Secondary | ICD-10-CM | POA: Diagnosis not present

## 2015-04-27 DIAGNOSIS — E1165 Type 2 diabetes mellitus with hyperglycemia: Secondary | ICD-10-CM

## 2015-04-27 LAB — COMPREHENSIVE METABOLIC PANEL
ALK PHOS: 69 U/L (ref 39–117)
ALT: 13 U/L (ref 0–53)
AST: 12 U/L (ref 0–37)
Albumin: 4.1 g/dL (ref 3.5–5.2)
BILIRUBIN TOTAL: 0.5 mg/dL (ref 0.2–1.2)
BUN: 23 mg/dL (ref 6–23)
CO2: 23 meq/L (ref 19–32)
Calcium: 8.8 mg/dL (ref 8.4–10.5)
Chloride: 103 mEq/L (ref 96–112)
Creatinine, Ser: 1.13 mg/dL (ref 0.40–1.50)
GFR: 65.65 mL/min (ref >60.00)
Glucose, Bld: 180 mg/dL — ABNORMAL HIGH (ref 70–99)
Potassium: 4 mEq/L (ref 3.5–5.1)
SODIUM: 137 meq/L (ref 135–145)
TOTAL PROTEIN: 6.5 g/dL (ref 6.0–8.3)

## 2015-04-27 LAB — CBC WITH DIFFERENTIAL/PLATELET
BASOS ABS: 0 10*3/uL (ref 0.0–0.1)
BASOS PCT: 0.4 % (ref 0.0–3.0)
EOS ABS: 0.1 10*3/uL (ref 0.0–0.7)
Eosinophils Relative: 1.2 % (ref 0.0–5.0)
HEMATOCRIT: 36.5 % — AB (ref 39.0–52.0)
Hemoglobin: 12 g/dL — ABNORMAL LOW (ref 13.0–17.0)
LYMPHS ABS: 1.4 10*3/uL (ref 0.7–4.0)
LYMPHS PCT: 21.2 % (ref 12.0–46.0)
MCHC: 33 g/dL (ref 30.0–36.0)
MCV: 89.8 fl (ref 78.0–100.0)
Monocytes Absolute: 0.7 10*3/uL (ref 0.1–1.0)
Monocytes Relative: 10.3 % (ref 3.0–12.0)
NEUTROS ABS: 4.5 10*3/uL (ref 1.4–7.7)
NEUTROS PCT: 66.9 % (ref 43.0–77.0)
PLATELETS: 145 10*3/uL — AB (ref 150.0–400.0)
RBC: 4.06 Mil/uL — ABNORMAL LOW (ref 4.22–5.81)
RDW: 14.9 % (ref 11.5–15.5)
WBC: 6.8 10*3/uL (ref 4.0–10.5)

## 2015-04-27 LAB — HEMOGLOBIN A1C: Hgb A1c MFr Bld: 7.9 % — ABNORMAL HIGH (ref 4.6–6.5)

## 2015-04-27 NOTE — Assessment & Plan Note (Signed)
BP Readings from Last 3 Encounters:  04/27/15 137/55  03/18/15 140/58  01/22/15 133/64   BP well controlled. Renal function with labs. Continue current medication.

## 2015-04-27 NOTE — Patient Instructions (Signed)
Continue current medications.   Limit intake of sugar as much as possible.  Labs today.  Follow up in 3 months.

## 2015-04-27 NOTE — Progress Notes (Signed)
Subjective:    Patient ID: Matthew Blank., male    DOB: June 29, 1928, 82 y.o.   MRN: [id number]  HPI  82YO male presents for follow up.  DM - Notes some dietary indiscretion over holidays.  Compliant with medication. BG have been higher, some over 200s. Forgot exact readings except for few near 160. Now working on diet. Limiting sugar intake.  Feeling well. No other concerns. No CP, dyspnea.  Wt Readings from Last 3 Encounters:  04/27/15 196 lb 2 oz (88.962 kg)  03/18/15 191 lb 12 oz (86.977 kg)  01/22/15 192 lb 2 oz (87.147 kg)   BP Readings from Last 3 Encounters:  04/27/15 137/55  03/18/15 140/58  01/22/15 133/64    Past Medical History  Diagnosis Date  . COPD (chronic obstructive pulmonary disease) ([facility])   . Smoker   . Mild hypertension   . Hypercholesterolemia   . Upper GI bleed 1980  . Hematemesis/vomiting blood 04/10/11  . Prostate cancer ([place])   . Prostate cancer [facility])     radiation therapy   . Coronary artery disease   . Heart murmur   . Arthritis   . Chicken pox   . Emphysema of lung ([place])   . GERD (gastroesophageal reflux disease)   . Ulcer   . Allergy   . Colon polyps   . Cholecystitis   . Pancreatitis   . CHF (congestive heart failure) (HCC)    Family History  Problem Relation Age of Onset  . Emphysema Sister     smoker  . Prostate cancer Father   . Liver cancer Brother   . Prostate cancer Brother    Past Surgical History  Procedure Laterality Date  . Appendectomy    . Stomach surgery      bleeding ulcers, followed by Dr. [name]  . Cardiac catheterization  May 2002 and Feb 2013    [facility]; no stents   . Circumcision    . Sp cholecystomy     Social History   Social History  . Marital Status: Married    Spouse Name: N/A  . Number of Children: 2  . Years of Education: N/A   Occupational History  . 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     Social History Main Topics  . Smoking status: Former Smoker -- 1.00 packs/day for 65 years   Types: Cigarettes    Quit date: 04/10/2011  . Smokeless tobacco: Former Systems developer    Types: Chew  . Alcohol Use: No  . Drug Use: No  . Sexual Activity: Not Asked   Other Topics Concern  . None   Social History Narrative   Lives in [place] alone. Wife in nursing home. Has 2 children.      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  Eyes: Negative for visual disturbance.  Respiratory: Negative for cough and shortness of breath.   Cardiovascular: Negative for chest pain, palpitations and leg swelling.  Gastrointestinal: Negative for nausea, vomiting, abdominal pain, diarrhea, constipation and abdominal distention.  Genitourinary: Negative for dysuria, urgency and difficulty urinating.  Musculoskeletal: Negative for arthralgias and gait problem.  Skin: Negative for color change and rash.  Hematological: Negative for adenopathy.  Psychiatric/Behavioral: Negative for sleep disturbance and dysphoric mood. The patient is not nervous/anxious.  Objective:    BP 137/55 mmHg  Pulse 81  Temp(Src) 97.8 F (36.6 C) (Oral)  Ht 5\' 3"  (1.6 m)  Wt 196 lb 2 oz (88.962 kg)  BMI 34.75 kg/m2  SpO2 96% Physical Exam  Constitutional: He is oriented to person, place, and time. He appears well-developed and well-nourished. No distress.  HENT:  Head: Normocephalic and atraumatic.  Right Ear: External ear normal.  Left Ear: External ear normal.  Nose: Nose normal.  Mouth/Throat: Oropharynx is clear and moist. No oropharyngeal exudate.  Eyes: Conjunctivae and EOM are normal. Pupils are equal, round, and reactive to light. Right eye exhibits no discharge. Left eye exhibits no discharge. No scleral icterus.  Neck: Normal range of motion. Neck supple. No tracheal deviation present. No thyromegaly present.  Cardiovascular:  Normal rate, regular rhythm and normal heart sounds.  Exam reveals no gallop and no friction rub.   No murmur heard. Pulmonary/Chest: Effort normal and breath sounds normal. No accessory muscle usage. No tachypnea. No respiratory distress. He has no decreased breath sounds. He has no wheezes. He has no rhonchi. He has no rales. He exhibits no tenderness.  Musculoskeletal: Normal range of motion. He exhibits no edema.  Lymphadenopathy:    He has no cervical adenopathy.  Neurological: He is alert and oriented to person, place, and time. No cranial nerve deficit. Coordination normal.  Skin: Skin is warm and dry. No rash noted. He is not diaphoretic. No erythema. No pallor.  Psychiatric: He has a normal mood and affect. His behavior is normal. Judgment and thought content normal.          Assessment & Plan:   Problem List Items Addressed This Visit      Unprioritized   COPD (chronic obstructive pulmonary disease) (HCC) (Chronic)    Symptomatically doing well. Will continue to monitor.      Diabetes mellitus type 2, uncontrolled (Matthew Miles) - Primary (Chronic)    BG elevated recently. Encouraged healthy diet. A1c with labs today.      Relevant Orders   Comprehensive metabolic panel   Hemoglobin A1c   Essential hypertension    BP Readings from Last 3 Encounters:  04/27/15 137/55  03/18/15 140/58  01/22/15 133/64   BP well controlled. Renal function with labs. Continue current medication.      Thrombocytopenia, unspecified (HCC) (Chronic)    Repeat CBC.      Relevant Orders   CBC with Differential/Platelet       Return in about 3 months (around 07/26/2015) for Recheck of Diabetes.

## 2015-04-27 NOTE — Progress Notes (Signed)
Pre visit review using our clinic review tool, if applicable. No additional management support is needed unless otherwise documented below in the visit note. 

## 2015-04-27 NOTE — Assessment & Plan Note (Signed)
Symptomatically doing well. Will continue to monitor.

## 2015-04-27 NOTE — Assessment & Plan Note (Signed)
BG elevated recently. Encouraged healthy diet. A1c with labs today.

## 2015-04-27 NOTE — Assessment & Plan Note (Signed)
Repeat CBC

## 2015-04-28 NOTE — Addendum Note (Signed)
Addended by: Ronette Deter A on: 04/28/2015 06:03 PM   Modules accepted: Orders

## 2015-04-29 ENCOUNTER — Telehealth: Payer: Self-pay | Admitting: Internal Medicine

## 2015-04-29 NOTE — Telephone Encounter (Signed)
Pt lvm asking someone to call him back regarding his medication. He did not state what medication. Thank you

## 2015-04-30 MED ORDER — POTASSIUM CHLORIDE CRYS ER 20 MEQ PO TBCR: 20.0000 meq | EXTENDED_RELEASE_TABLET | Freq: Every day | ORAL | Status: DC

## 2015-04-30 MED ORDER — CARVEDILOL 3.125 MG PO TABS: 3.1250 mg | ORAL_TABLET | Freq: Two times a day (BID) | ORAL | Status: DC

## 2015-04-30 MED ORDER — TORSEMIDE 10 MG PO TABS: 10.0000 mg | ORAL_TABLET | Freq: Two times a day (BID) | ORAL | Status: DC | PRN

## 2015-04-30 MED ORDER — PRAVASTATIN SODIUM 40 MG PO TABS: 40.0000 mg | ORAL_TABLET | Freq: Every day | ORAL | Status: DC

## 2015-04-30 MED ORDER — GLIPIZIDE ER 10 MG PO TB24: 10.0000 mg | ORAL_TABLET | Freq: Two times a day (BID) | ORAL | Status: DC

## 2015-04-30 NOTE — Telephone Encounter (Signed)
Spoke with patient, requesting refills on five of his meds.  Prefers to send to [facility] in [place].

## 2015-05-08 ENCOUNTER — Inpatient Hospital Stay: Payer: PPO

## 2015-05-08 ENCOUNTER — Inpatient Hospital Stay: Payer: PPO | Attending: Oncology | Admitting: Oncology

## 2015-05-08 VITALS — BP 137/59 | HR 87 | Temp 97.1°F | Resp 18 | Wt 191.4 lb

## 2015-05-08 DIAGNOSIS — I10 Essential (primary) hypertension: Secondary | ICD-10-CM | POA: Diagnosis not present

## 2015-05-08 DIAGNOSIS — Z87891 Personal history of nicotine dependence: Secondary | ICD-10-CM | POA: Insufficient documentation

## 2015-05-08 DIAGNOSIS — I2510 Atherosclerotic heart disease of native coronary artery without angina pectoris: Secondary | ICD-10-CM | POA: Insufficient documentation

## 2015-05-08 DIAGNOSIS — Z79899 Other long term (current) drug therapy: Secondary | ICD-10-CM

## 2015-05-08 DIAGNOSIS — Z8546 Personal history of malignant neoplasm of prostate: Secondary | ICD-10-CM | POA: Insufficient documentation

## 2015-05-08 DIAGNOSIS — D696 Thrombocytopenia, unspecified: Secondary | ICD-10-CM | POA: Insufficient documentation

## 2015-05-08 DIAGNOSIS — K219 Gastro-esophageal reflux disease without esophagitis: Secondary | ICD-10-CM | POA: Diagnosis not present

## 2015-05-08 DIAGNOSIS — Z7982 Long term (current) use of aspirin: Secondary | ICD-10-CM | POA: Diagnosis not present

## 2015-05-08 DIAGNOSIS — E7800 Pure hypercholesterolemia, unspecified: Secondary | ICD-10-CM | POA: Insufficient documentation

## 2015-05-08 DIAGNOSIS — J449 Chronic obstructive pulmonary disease, unspecified: Secondary | ICD-10-CM | POA: Insufficient documentation

## 2015-05-08 DIAGNOSIS — M1990 Unspecified osteoarthritis, unspecified site: Secondary | ICD-10-CM | POA: Diagnosis not present

## 2015-05-08 LAB — CBC
HEMATOCRIT: 36.8 % — AB (ref 40.0–52.0)
Hemoglobin: 12.4 g/dL — ABNORMAL LOW (ref 13.0–18.0)
MCH: 29.6 pg (ref 26.0–34.0)
MCHC: 33.8 g/dL (ref 32.0–36.0)
MCV: 87.7 fL (ref 80.0–100.0)
Platelets: 119 10*3/uL — ABNORMAL LOW (ref 150–440)
RBC: 4.2 MIL/uL — AB (ref 4.40–5.90)
RDW: 14.4 % (ref 11.5–14.5)
WBC: 7.4 10*3/uL (ref 3.8–10.6)

## 2015-05-08 LAB — IRON AND TIBC
Iron: 70 ug/dL (ref 45–182)
SATURATION RATIOS: 19 % (ref 17.9–39.5)
TIBC: 378 ug/dL (ref 250–450)
UIBC: 308 ug/dL

## 2015-05-08 LAB — FOLATE: FOLATE: 32 ng/mL (ref >5.9)

## 2015-05-08 LAB — FERRITIN: Ferritin: 22 ng/mL — ABNORMAL LOW (ref 24–336)

## 2015-05-08 NOTE — Progress Notes (Signed)
Patient last seen in office in 20014 for thrombocytopenia and his PCP would like for him to have a re-evaluation for same issue.

## 2015-05-08 NOTE — Progress Notes (Signed)
[place]  [phone] [phone]  ID: Matthew Miles. OB: [birth year]  MR#: [id number]  IFO#:[id number]  Patient Care Team: Jackolyn Confer, MD as PCP - General (Internal Medicine)  CHIEF COMPLAINT:  Chief Complaint  Patient presents with  . thrombocytopenia    INTERVAL HISTORY: Patient last evaluated in June 2014. He returns to clinic today after having a declining platelet count on routine blood work. He continues to feel well and is asymptomatic.  He denies any easy bleeding or bruising.  He has a good appetite and denies weight loss.  He denies any fevers or illnesses.  He has no chest pain or shortness of breath.  He denies any nausea, vomiting, constipation, or diarrhea.  He has no urinary complaints.  Patient offers no specific complaints today.  REVIEW OF SYSTEMS:   Review of Systems  Constitutional: Negative for fever, weight loss and malaise/fatigue.  Respiratory: Negative.  Negative for cough, hemoptysis and shortness of breath.   Cardiovascular: Negative.  Negative for chest pain.  Gastrointestinal: Negative.  Negative for blood in stool and melena.  Musculoskeletal: Negative.   Neurological: Negative.  Negative for weakness.  Endo/Heme/Allergies: Does not bruise/bleed easily.    As per HPI. Otherwise, a complete review of systems is negatve.  PAST MEDICAL HISTORY: Past Medical History  Diagnosis Date  . COPD (chronic obstructive pulmonary disease) ([place])   . Smoker   . Mild hypertension   . Hypercholesterolemia   . Upper GI bleed 1980  . Hematemesis/vomiting blood 04/10/11  . Prostate cancer ([place])   . Prostate cancer [facility])     radiation therapy   . Coronary artery disease   . Heart murmur   . Arthritis   . Chicken pox   . Emphysema of lung ([place])   . GERD (gastroesophageal reflux disease)   . Ulcer   . Allergy   . Colon polyps   . Cholecystitis   . Pancreatitis   . CHF (congestive heart failure) ([place])      PAST SURGICAL HISTORY: Past Surgical History  Procedure Laterality Date  . Appendectomy    . Stomach surgery      bleeding ulcers, followed by Dr. [name]  . Cardiac catheterization  May 2002 and Feb 2013    [facility]; no stents   . Circumcision    . Sp cholecystomy      FAMILY HISTORY Family History  Problem Relation Age of Onset  . Emphysema Sister     smoker  . Prostate cancer Father   . Liver cancer Brother   . Prostate cancer Brother        ADVANCED DIRECTIVES:    HEALTH MAINTENANCE: Social History  Substance Use Topics  . Smoking status: Former Smoker -- 1.00 packs/day for 65 years    Types: Cigarettes    Quit date: 04/10/2011  . Smokeless tobacco: Former Systems developer    Types: Chew  . Alcohol Use: No     Colonoscopy:  PAP:  Bone density:  Lipid panel:  Allergies  Allergen Reactions  . Sulfa Antibiotics     GI upset  . Tradjenta [Linagliptin]     Hair loss    Current Outpatient Prescriptions  Medication Sig Dispense Refill  . aspirin 81 MG tablet Take 81 mg by mouth daily.    . Blood Glucose Monitoring Suppl W/DEVICE KIT Accu-chek Aviva Plus. Use as directed to check blood sugar two times a day. Dx. E11.65 1 each 0  . carvedilol (COREG) 3.125 MG  tablet Take 1 tablet (3.125 mg total) by mouth 2 (two) times daily. 180 tablet 3  . Cyanocobalamin (B-12) 100 MCG TABS Take by mouth daily.    . cyclobenzaprine (FLEXERIL) 5 MG tablet Take 1 tablet (5 mg total) by mouth 3 (three) times daily as needed for muscle spasms. 30 tablet 1  . glipiZIDE (GLUCOTROL XL) 10 MG 24 hr tablet Take 1 tablet (10 mg total) by mouth 2 (two) times daily. 180 tablet 3  . glucose blood (ACCU-CHEK AVIVA) test strip Use as instructed, Accu-Chek Aviva Plus strips. Use to check blood sugar two times a day. Dx. E11.65 200 each 3  . glucose blood test strip One touch ultra test strips. Use to check blood sugar two times a day. Dx. 250.00 200 each 3  . nitroGLYCERIN (NITROSTAT) 0.4 MG SL tablet  Place 1 tablet (0.4 mg total) under the tongue every 5 (five) minutes as needed. 60 tablet 0  . omeprazole (PRILOSEC) 20 MG capsule TAKE 1 CAPSULE TWICE DAILY BEFORE MEALS 180 capsule 3  . potassium chloride SA (K-DUR,KLOR-CON) 20 MEQ tablet Take 1 tablet (20 mEq total) by mouth daily. 90 tablet 3  . pravastatin (PRAVACHOL) 40 MG tablet Take 1 tablet (40 mg total) by mouth daily. 90 tablet 3  . torsemide (DEMADEX) 10 MG tablet Take 1 tablet (10 mg total) by mouth 2 (two) times daily as needed. 180 tablet 3   No current facility-administered medications for this visit.    OBJECTIVE: Filed Vitals:   05/08/15 1030  BP: 137/59  Pulse: 87  Temp: 97.1 F (36.2 C)  Resp: 18     Body mass index is 33.91 kg/(m^2).    ECOG FS:0 - Asymptomatic  General: Well-developed, well-nourished, no acute distress. Eyes: Pink conjunctiva, anicteric sclera. HEENT: Normocephalic, moist mucous membranes, clear oropharnyx. Lungs: Clear to auscultation bilaterally. Heart: Regular rate and rhythm. No rubs, murmurs, or gallops. Abdomen: Soft, nontender, nondistended. No organomegaly noted, normoactive bowel sounds. Musculoskeletal: No edema, cyanosis, or clubbing. Neuro: Alert, answering all questions appropriately. Cranial nerves grossly intact. Skin: No rashes or petechiae noted. Psych: Normal affect. Lymphatics: No cervical, calvicular, axillary or inguinal LAD.   LAB RESULTS:  Lab Results  Component Value Date   NA 137 04/27/2015   K 4.0 04/27/2015   CL 103 04/27/2015   CO2 23 04/27/2015   GLUCOSE 180* 04/27/2015   BUN 23 04/27/2015   CREATININE 1.13 04/27/2015   CALCIUM 8.8 04/27/2015   PROT 6.5 04/27/2015   ALBUMIN 4.1 04/27/2015   AST 12 04/27/2015   ALT 13 04/27/2015   ALKPHOS 69 04/27/2015   BILITOT 0.5 04/27/2015   GFRNONAA >60 08/09/2012   GFRAA >60 08/09/2012    Lab Results  Component Value Date   WBC 7.4 05/08/2015   NEUTROABS 4.5 04/27/2015   HGB 12.4* 05/08/2015   HCT  36.8* 05/08/2015   MCV 87.7 05/08/2015   PLT 119* 05/08/2015     STUDIES: No results found.  ASSESSMENT:  Thrombocytopenia.  PLAN:    1.  Thrombocytopenia: Patient has a decreased platelet count of 119. He has a mildly decreased ferritin at 22, but the remainder of his laboratory work is either negative or within normal limits. Previously, platelet antibodies and SPEP were negative. Repeat testing is pending at time of dictation.  No intervention is needed at this time.  Patient does not require bone marrow biopsy. Return to clinic in 4 months with repeat laboratory work and further evaluation.    Patient  expressed understanding and was in agreement with this plan. He also understands that He can call clinic at any time with any questions, concerns, or complaints.   Lloyd Huger, MD   05/08/2015 11:53 AM

## 2015-05-09 LAB — PLATELET ANTIBODY PROFILE, SERUM
HLA Ab Ser Ql EIA: POSITIVE — AB
IA/IIA Antibody: NEGATIVE
IB/IX Antibody: NEGATIVE
IIB/IIIA ANTIBODY: NEGATIVE

## 2015-05-09 LAB — VITAMIN B12: VITAMIN B 12: 1113 pg/mL — AB (ref 180–914)

## 2015-05-11 LAB — PROTEIN ELECTROPHORESIS, SERUM
A/G RATIO SPE: 1.5 (ref 0.7–1.7)
ALBUMIN ELP: 3.7 g/dL (ref 2.9–4.4)
ALPHA-1-GLOBULIN: 0.3 g/dL (ref 0.0–0.4)
Alpha-2-Globulin: 0.7 g/dL (ref 0.4–1.0)
BETA GLOBULIN: 0.8 g/dL (ref 0.7–1.3)
Gamma Globulin: 0.6 g/dL (ref 0.4–1.8)
Globulin, Total: 2.4 g/dL (ref 2.2–3.9)
Total Protein ELP: 6.1 g/dL (ref 6.0–8.5)

## 2015-06-08 ENCOUNTER — Encounter: Payer: Self-pay | Admitting: Podiatry

## 2015-06-08 ENCOUNTER — Ambulatory Visit (INDEPENDENT_AMBULATORY_CARE_PROVIDER_SITE_OTHER): Payer: PPO | Admitting: Podiatry

## 2015-06-08 VITALS — BP 153/61 | HR 105 | Resp 16

## 2015-06-08 DIAGNOSIS — M2040 Other hammer toe(s) (acquired), unspecified foot: Secondary | ICD-10-CM | POA: Diagnosis not present

## 2015-06-08 DIAGNOSIS — E119 Type 2 diabetes mellitus without complications: Secondary | ICD-10-CM

## 2015-06-08 DIAGNOSIS — Q6650 Congenital pes planus, unspecified foot: Secondary | ICD-10-CM

## 2015-06-08 NOTE — Progress Notes (Signed)
He presents today with like to have a pair of new diabetic shoes and insoles. He states that there really has been no change from previous evaluation of his feet.  Objective: Vital signs are stable he is alert and oriented 3 pulses are palpable but diminished bilateral. Capillary fill time is within normal limits. Neurologic sensorium is intact bilateral. Muscle strength is normal. Mild HAV deformity and hammertoe deformities noted bilateral no open lesions or pre-ulcerative calluses.  Assessment: Diabetes mellitus with hammertoe deformities and early peripheral drill disease.  Plan: He was scanned for a new set of diabetic shoes.

## 2015-06-23 ENCOUNTER — Ambulatory Visit (INDEPENDENT_AMBULATORY_CARE_PROVIDER_SITE_OTHER): Payer: PPO

## 2015-06-23 VITALS — BP 138/62 | HR 78 | Temp 98.0°F | Resp 14 | Ht 63.0 in | Wt 190.8 lb

## 2015-06-23 DIAGNOSIS — Z0000 Encounter for general adult medical examination without abnormal findings: Secondary | ICD-10-CM

## 2015-06-23 NOTE — Progress Notes (Signed)
Annual Wellness Visit as completed by Health Coach was reviewed in full.  

## 2015-06-23 NOTE — Patient Instructions (Addendum)
Matthew Miles ,  Thank you for taking time to come for your Medicare Wellness Visit. I appreciate your ongoing commitment to your health goals. Please review the following plan we discussed and let me know if I can assist you in the future.    Return in April for scheduled follow up.  This is a list of the screening recommended for you and due dates:  Health Maintenance  Topic Date Due  . Shingles Vaccine  12/09/1990  . Eye exam for diabetics  10/21/2015  . Hemoglobin A1C  10/25/2015  . Flu Shot  11/10/2015  . Urine Protein Check  01/22/2016  . Complete foot exam   06/09/2016  . Tetanus Vaccine  04/06/2021  . Pneumonia vaccines  Completed

## 2015-06-23 NOTE — Progress Notes (Signed)
Subjective:   Matthew Miles. is a 82 y.o. male who presents for Medicare Annual/Subsequent preventive examination.  Review of Systems:  No ROS.  Medicare Wellness Visit.  Cardiac Risk Factors include: advanced age (>32mn, >>91women);male gender;diabetes mellitus;hypertension     Objective:    Vitals: BP 138/62 mmHg  Pulse 78  Temp(Src) 98 F (36.7 C) (Oral)  Resp 14  Ht _0  (1.6 m)  Wt 190 lb 12.8 oz (86.546 kg)  BMI 33.81 kg/m2  SpO2 97%  Tobacco History  Smoking status  . Former Smoker -- 1.00 packs/day for 65 years  . Types: Cigarettes  . Quit date: 04/10/2011  Smokeless tobacco  . 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 Types: Chew     Counseling given: Not Answered   Past Medical History  Diagnosis Date  . COPD (chronic obstructive pulmonary disease) ([place]   . Smoker   . Mild hypertension   . Hypercholesterolemia   . Upper GI bleed 1980  . Hematemesis/vomiting blood 04/10/11  . [place]   . Prostate cancer ([facility]     radiation therapy   . Coronary artery disease   . Heart murmur   . Arthritis   . Chicken pox   . [place] of lung ([place]   . GERD (gastroesophageal reflux disease)   . Ulcer   . Allergy   . Colon polyps   . Cholecystitis   . Pancreatitis   . CHF (congestive heart failure) ([facility]    Past Surgical History  Procedure Laterality Date  . Appendectomy    . Stomach surgery      bleeding ulcers, followed by Dr. ETiffany Kocher . [facility]  May 2002 [facility]   . [facility]um[facility]    . Sp cholecystomy     Family History  Problem Relation Age of Onset  . Emphysema Sister     smoker  . Prostate cancer Father   . Liver cancer Brother   . Prostate cancer Brother    History  Sexual Activity  . Sexual Activity: Not Currently    Outpatient Encounter Prescriptions as of 06/23/2015  Medication Sig  . aspirin 81 MG tablet Take 81 mg by mouth daily.  . Blood Glucose Monitoring Suppl W/DEVICE KIT Accu-chek Aviva Plus.  Use as directed to check blood sugar two times a day. Dx. E11.65  . carvedilol (COREG) 3.125 MG tablet Take 1 tablet (3.125 mg total) by mouth 2 (two) times daily.  . Cyanocobalamin (B-12) 100 MCG TABS Take by mouth daily.  . cyclobenzaprine (FLEXERIL) 5 MG tablet Take 1 tablet (5 mg total) by mouth 3 (three) times daily as needed for muscle spasms.  .Marland KitchenglipiZIDE (GLUCOTROL XL) 10 MG 24 hr tablet Take 1 tablet (10 mg total) by mouth 2 (two) times daily.  .Marland Kitchenglucose blood (ACCU-CHEK AVIVA) test strip Use as instructed, Accu-Chek Aviva Plus strips. Use to check blood sugar two times a day. Dx. E11.65  . glucose blood test strip One touch ultra test strips. Use to check blood sugar two times a day. Dx. 250.00  . nitroGLYCERIN (NITROSTAT) 0.4 MG SL tablet Place 1 tablet (0.4 mg total) under the tongue every 5 (five) minutes as needed.  .Marland Kitchenomeprazole (PRILOSEC) 20 MG capsule TAKE 1 CAPSULE TWICE DAILY BEFORE MEALS  . potassium chloride SA (K-DUR,KLOR-CON) 20 MEQ tablet Take 1 tablet (20 mEq total) by mouth daily.  . pravastatin (PRAVACHOL) 40 MG tablet Take 1 tablet (40 mg  total) by mouth daily.  Marland Kitchen torsemide (DEMADEX) 10 MG tablet Take 1 tablet (10 mg total) by mouth 2 (two) times daily as needed.   No facility-administered encounter medications on file as of 06/23/2015.    Activities of Daily Living In your present state of health, do you have any difficulty performing the following activities: 06/23/2015  Hearing? Y  Vision? N  Difficulty concentrating or making decisions? N  Walking or climbing stairs? Y  Dressing or bathing? N  Doing errands, shopping? N  Preparing Food and eating ? N  Using the Toilet? N  In the past six months, have you accidently leaked urine? N  Do you have problems with loss of bowel control? N  Managing your Medications? N  Managing your Finances? N  Housekeeping or managing your Housekeeping? N    Patient Care Team: Jackolyn Confer, MD as PCP - General (Internal  Medicine)   Assessment:   This is a routine wellness examination for [place]. The goal of the wellness visit is to assist the patient how to close the gaps in care and create a preventative care plan for the patient.   Osteoporosis risk reviewed.  Medications reviewed; taking without issues or barriers.  Safety issues reviewed; smoke detectors in the home. Firearms locked in a safe area in the home. Wears seatbelts when driving or riding with others. No violence in the home.  No identified risk were noted; The patient was oriented x 3; appropriate in dress and manner and no objective failures at ADL's or IADL's.   ZOSTAVAX vaccine postponed for follow up with insurance, per patient request.   Other secondary pul-stable and followed by PCP Primary pulmonary-stable and followed by PCP Chronic diastolic (co-stable and followed by [name] Malignant neoplasm of prostate-stable and followed by Dr. Rogers Blocker PCP Chr pulmon heart dis NEC-stable and followed by Dr, Rockey Situ CHF NOS-stable and followed by [name] Chr diastolic hrt fail-stable and followed by [name] Malign neopl prostate-stable and followed by PCP  Patient Concerns:  None at this time.  Follow up with PCP as needed.  Exercise Activities and Dietary recommendations Current Exercise Habits: The patient does not participate in regular exercise at present  Goals    . Healthy Lifestyle     Stay hydrated!  Drink plenty of water. Low carb foods.  Choose lean meats, fruits and vegetables.    . Increase physical activity     Ride exercise bike 1-2 times weekly, as tolerated.       Fall Risk Fall Risk  06/23/2015 04/21/2014 04/01/2013 03/07/2012  Falls in the past year? No No No Yes  Number falls in past yr: - - - 1  Injury with Fall? - - - Yes   Depression Screen PHQ 2/9 Scores 06/23/2015 04/21/2014 04/01/2013 03/07/2012  PHQ - 2 Score 0 0 0 0    Cognitive Testing MMSE - Mini Mental State Exam 06/23/2015    Orientation to time 5  Orientation to Place 5  Registration 3  Attention/ Calculation 5  Recall 3  Language- name 2 objects 2  Language- repeat 1  Language- follow 3 step command 3  Language- read & follow direction 1  Write a sentence 1  Copy design 1  Total score 30    Immunization History  Administered Date(s) Administered  . Influenza Split 12/29/2011  . Influenza,inj,Quad PF,36+ Mos 01/10/2013, 01/17/2014, 01/22/2015  . Pneumococcal Conjugate-13 04/21/2014  . Pneumococcal Polysaccharide-23 12/29/2011  . Tdap 04/07/2011   Screening  Tests Health Maintenance  Topic Date Due  . ZOSTAVAX  12/09/1990  . OPHTHALMOLOGY EXAM  10/21/2015  . HEMOGLOBIN A1C  10/25/2015  . INFLUENZA VACCINE  11/10/2015  . URINE MICROALBUMIN  01/22/2016  . FOOT EXAM  06/09/2016  . TETANUS/TDAP  04/06/2021  . PNA vac Low Risk Adult  Completed      Plan:   End of life planning; Advance aging; Advanced directives discussed. Copy of current HCPOA/Living Will requested.   During the course of the visit the patient was educated and counseled about the following appropriate screening and preventive services:   Vaccines to include Pneumoccal, Influenza, Hepatitis B, Td, Zostavax, HCV  Electrocardiogram  Cardiovascular Disease  Colorectal cancer screening  Diabetes screening  Prostate Cancer Screening  Glaucoma screening  Nutrition counseling   Smoking cessation counseling  Patient Instructions (the written plan) was given to the patient.    Varney Biles, LPN  5/83/1674

## 2015-07-31 ENCOUNTER — Ambulatory Visit (INDEPENDENT_AMBULATORY_CARE_PROVIDER_SITE_OTHER): Payer: PPO | Admitting: *Deleted

## 2015-07-31 DIAGNOSIS — E119 Type 2 diabetes mellitus without complications: Secondary | ICD-10-CM | POA: Diagnosis not present

## 2015-07-31 DIAGNOSIS — Q6650 Congenital pes planus, unspecified foot: Secondary | ICD-10-CM

## 2015-07-31 DIAGNOSIS — M2040 Other hammer toe(s) (acquired), unspecified foot: Secondary | ICD-10-CM | POA: Diagnosis not present

## 2015-07-31 NOTE — Progress Notes (Signed)
Dispensed diabetic shoes and 3 pairs of insoles. Instructions were reviewed and a copy was given to the patient. Patient to reappointment for regularly scheduled diabetic foot care visits or if she experiences any trouble with her diabetic shoes. 

## 2015-07-31 NOTE — Patient Instructions (Signed)

## 2015-08-05 ENCOUNTER — Encounter: Payer: Self-pay | Admitting: Internal Medicine

## 2015-08-05 ENCOUNTER — Ambulatory Visit (INDEPENDENT_AMBULATORY_CARE_PROVIDER_SITE_OTHER): Payer: PPO | Admitting: Internal Medicine

## 2015-08-05 VITALS — BP 134/60 | HR 90 | Ht 63.0 in | Wt 191.1 lb

## 2015-08-05 DIAGNOSIS — IMO0001 Reserved for inherently not codable concepts without codable children: Secondary | ICD-10-CM

## 2015-08-05 DIAGNOSIS — I10 Essential (primary) hypertension: Secondary | ICD-10-CM | POA: Diagnosis not present

## 2015-08-05 DIAGNOSIS — E1165 Type 2 diabetes mellitus with hyperglycemia: Secondary | ICD-10-CM | POA: Diagnosis not present

## 2015-08-05 DIAGNOSIS — J449 Chronic obstructive pulmonary disease, unspecified: Secondary | ICD-10-CM

## 2015-08-05 LAB — COMPREHENSIVE METABOLIC PANEL
ALT: 15 U/L (ref 0–53)
AST: 14 U/L (ref 0–37)
Albumin: 4.1 g/dL (ref 3.5–5.2)
Alkaline Phosphatase: 68 U/L (ref 39–117)
BILIRUBIN TOTAL: 0.5 mg/dL (ref 0.2–1.2)
BUN: 29 mg/dL — AB (ref 6–23)
CO2: 27 meq/L (ref 19–32)
CREATININE: 1.3 mg/dL (ref 0.40–1.50)
Calcium: 8.9 mg/dL (ref 8.4–10.5)
Chloride: 100 mEq/L (ref 96–112)
GFR: 55.81 mL/min — ABNORMAL LOW (ref >60.00)
GLUCOSE: 268 mg/dL — AB (ref 70–99)
Potassium: 3.9 mEq/L (ref 3.5–5.1)
SODIUM: 135 meq/L (ref 135–145)
Total Protein: 6.3 g/dL (ref 6.0–8.3)

## 2015-08-05 LAB — MICROALBUMIN / CREATININE URINE RATIO
CREATININE, U: 81.8 mg/dL
MICROALB/CREAT RATIO: 0.9 mg/g (ref 0.0–30.0)

## 2015-08-05 LAB — LDL CHOLESTEROL, DIRECT: LDL DIRECT: 80 mg/dL

## 2015-08-05 LAB — LIPID PANEL
CHOL/HDL RATIO: 4
Cholesterol: 135 mg/dL (ref 0–200)
HDL: 35.1 mg/dL — ABNORMAL LOW (ref >39.00)
NONHDL: 100.16
Triglycerides: 310 mg/dL — ABNORMAL HIGH (ref 0.0–149.0)
VLDL: 62 mg/dL — AB (ref 0.0–40.0)

## 2015-08-05 LAB — HEMOGLOBIN A1C: Hgb A1c MFr Bld: 7.5 % — ABNORMAL HIGH (ref 4.6–6.5)

## 2015-08-05 MED ORDER — GLUCOSE BLOOD VI STRP: ORAL_STRIP | Status: DC

## 2015-08-05 MED ORDER — OMEPRAZOLE 20 MG PO CPDR: 20.0000 mg | DELAYED_RELEASE_CAPSULE | Freq: Two times a day (BID) | ORAL | Status: DC

## 2015-08-05 NOTE — Assessment & Plan Note (Signed)
Check A1c with labs. Continue current medications.

## 2015-08-05 NOTE — Patient Instructions (Signed)
Labs today.   Follow up in 3 months.  

## 2015-08-05 NOTE — Assessment & Plan Note (Signed)
Symptoms stable. Continue current medications.

## 2015-08-05 NOTE — Progress Notes (Signed)
Subjective:    Patient ID: Matthew Blank., male    DOB: Dec 23, 1928, 82 y.o.   MRN: [id number]  HPI  82YO male presents for follow up.  DM -  BG up and down. All BG under 200. Notes some dietary indiscretion and decreased physical activity. Trying to eat more salads and lean protein.  No recent dyspnea, cough. No chest pain.  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  Lab Results  Component Value Date   HGBA1C 7.9* 04/27/2015     Wt Readings from Last 3 Encounters:  08/05/15 191 lb 1.9 oz (86.691 kg)  06/23/15 190 lb 12.8 oz (86.546 kg)  05/08/15 191 lb 5.8 oz (86.8 kg)   BP Readings from Last 3 Encounters:  08/05/15 134/60  06/23/15 138/62  06/08/15 153/61    Past Medical History  Diagnosis Date  . COPD (chronic obstructive pulmonary disease) ([place])   . Smoker   . Mild hypertension   . Hypercholesterolemia   . Upper GI bleed 1980  . Hematemesis/vomiting blood 04/10/11  . Prostate cancer ([place])   . Prostate cancer [facility])     radiation therapy   . Coronary artery disease   . Heart murmur   . Arthritis   . Chicken pox   . Emphysema of lung ([place])   . GERD (gastroesophageal reflux disease)   . Ulcer   . Allergy   . Colon polyps   . Cholecystitis   . Pancreatitis   . CHF (congestive heart failure) (HCC)    Family History  Problem Relation Age of Onset  . Emphysema Sister     smoker  . Prostate cancer Father   . Liver cancer Brother   . Prostate cancer Brother    Past Surgical History  Procedure Laterality Date  . Appendectomy    . Stomach surgery      bleeding ulcers, followed by Dr. [name]  . Cardiac catheterization  May 2002 and Feb 2013    [facility]; no stents   . Circumcision    . Sp cholecystomy     Social History   Social History  . Marital Status: Married    Spouse Name: N/A  . Number of Children: 2  . Years of Education: N/A   Occupational History  . 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     Social History Main Topics  . Smoking  status: Former Smoker -- 1.00 packs/day for 65 years    Types: Cigarettes    Quit date: 04/10/2011  . Smokeless tobacco: Former Systems developer    Types: Chew  . Alcohol Use: No  . Drug Use: No  . Sexual Activity: Not Currently   Other Topics Concern  . None   Social History Narrative   Lives in [place] alone. Wife in nursing home. Has 2 children.      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  Eyes: Negative for visual disturbance.  Respiratory: Negative for cough and shortness of breath.   Cardiovascular: Negative for chest pain, palpitations and leg swelling.  Gastrointestinal: Negative for nausea, vomiting, abdominal pain, diarrhea, constipation and abdominal distention.  Genitourinary: Negative for dysuria, urgency and difficulty urinating.  Musculoskeletal: Negative for arthralgias and gait problem.  Skin: Negative for color change and rash.  Hematological: Negative for adenopathy.  Psychiatric/Behavioral: Negative for sleep disturbance and dysphoric mood. The patient is not nervous/anxious.        Objective:    BP 134/60 mmHg  Pulse 90  Ht 5\' 3"  (1.6 m)  Wt 191 lb 1.9 oz (86.691 kg)  BMI 33.86 kg/m2  SpO2 95% Physical Exam  Constitutional: He is oriented to person, place, and time. He appears well-developed and well-nourished. No distress.  HENT:  Head: Normocephalic and atraumatic.  Right Ear: External ear normal.  Left Ear: External ear normal.  Nose: Nose normal.  Mouth/Throat: Oropharynx is clear and moist. No oropharyngeal exudate.  Eyes: Conjunctivae and EOM are normal. Pupils are equal, round, and reactive to light. Right eye exhibits no discharge. Left eye exhibits no discharge. No scleral icterus.  Neck: Normal range of motion. Neck supple. No tracheal deviation present. No thyromegaly  present.  Cardiovascular: Normal rate, regular rhythm and normal heart sounds.  Exam reveals no gallop and no friction rub.   No murmur heard. Pulmonary/Chest: Effort normal and breath sounds normal. No accessory muscle usage. No tachypnea. No respiratory distress. He has no decreased breath sounds. He has no wheezes. He has no rhonchi. He has no rales. He exhibits no tenderness.  Musculoskeletal: Normal range of motion. He exhibits no edema.  Lymphadenopathy:    He has no cervical adenopathy.  Neurological: He is alert and oriented to person, place, and time. No cranial nerve deficit. Coordination normal.  Skin: Skin is warm and dry. No rash noted. He is not diaphoretic. No erythema. No pallor.  Psychiatric: He has a normal mood and affect. His behavior is normal. Judgment and thought content normal.          Assessment & Plan:   Problem List Items Addressed This Visit      Unprioritized   COPD (chronic obstructive pulmonary disease) ([place]) (Chronic)    Symptoms stable. Continue current medications.      Diabetes mellitus type 2, uncontrolled ([place]) - Primary (Chronic)    Check A1c with labs. Continue current medications.      Relevant Orders   Comprehensive metabolic panel   Hemoglobin A1c   Lipid panel   Microalbumin / creatinine urine ratio   Essential hypertension    BP Readings from Last 3 Encounters:  08/05/15 134/60  06/23/15 138/62  06/08/15 153/61   BP well controlled. Renal function with labs.          Return in about 3 months (around 11/04/2015) for Recheck.  Ronette Deter, MD Internal Medicine [facility]

## 2015-08-05 NOTE — Assessment & Plan Note (Signed)
BP Readings from Last 3 Encounters:  08/05/15 134/60  06/23/15 138/62  06/08/15 153/61   BP well controlled. Renal function with labs.

## 2015-08-20 ENCOUNTER — Telehealth: Payer: Self-pay | Admitting: Internal Medicine

## 2015-08-20 ENCOUNTER — Other Ambulatory Visit: Payer: Self-pay

## 2015-08-20 MED ORDER — ONETOUCH ULTRA SYSTEM W/DEVICE KIT: 1.0000 | PACK | Freq: Once | Status: DC

## 2015-08-20 MED ORDER — GLUCOSE BLOOD VI STRP: ORAL_STRIP | Status: DC

## 2015-08-20 NOTE — Telephone Encounter (Signed)
Pt's daughter called. The pharmacy thinks that his machine that checks is sugar levels is reading incorrect. They think he needs a new machine.

## 2015-08-20 NOTE — Telephone Encounter (Signed)
Spoke with the daughter, sent in new prescription for One Touch Ultra meter. He will track his BS to see what the reading are in comparsion.

## 2015-08-26 DIAGNOSIS — E119 Type 2 diabetes mellitus without complications: Secondary | ICD-10-CM | POA: Diagnosis not present

## 2015-08-26 LAB — HM DIABETES EYE EXAM

## 2015-09-02 ENCOUNTER — Inpatient Hospital Stay: Payer: PPO | Attending: Oncology

## 2015-09-02 ENCOUNTER — Inpatient Hospital Stay (HOSPITAL_BASED_OUTPATIENT_CLINIC_OR_DEPARTMENT_OTHER): Payer: PPO | Admitting: Oncology

## 2015-09-02 VITALS — BP 133/62 | HR 80 | Resp 18 | Wt 188.7 lb

## 2015-09-02 DIAGNOSIS — I10 Essential (primary) hypertension: Secondary | ICD-10-CM | POA: Diagnosis not present

## 2015-09-02 DIAGNOSIS — Z87891 Personal history of nicotine dependence: Secondary | ICD-10-CM | POA: Diagnosis not present

## 2015-09-02 DIAGNOSIS — Z8546 Personal history of malignant neoplasm of prostate: Secondary | ICD-10-CM | POA: Insufficient documentation

## 2015-09-02 DIAGNOSIS — Z79899 Other long term (current) drug therapy: Secondary | ICD-10-CM | POA: Diagnosis not present

## 2015-09-02 DIAGNOSIS — D696 Thrombocytopenia, unspecified: Secondary | ICD-10-CM

## 2015-09-02 DIAGNOSIS — Z7982 Long term (current) use of aspirin: Secondary | ICD-10-CM | POA: Insufficient documentation

## 2015-09-02 DIAGNOSIS — J449 Chronic obstructive pulmonary disease, unspecified: Secondary | ICD-10-CM | POA: Insufficient documentation

## 2015-09-02 DIAGNOSIS — M1990 Unspecified osteoarthritis, unspecified site: Secondary | ICD-10-CM | POA: Insufficient documentation

## 2015-09-02 DIAGNOSIS — K219 Gastro-esophageal reflux disease without esophagitis: Secondary | ICD-10-CM | POA: Diagnosis not present

## 2015-09-02 DIAGNOSIS — E7800 Pure hypercholesterolemia, unspecified: Secondary | ICD-10-CM | POA: Diagnosis not present

## 2015-09-02 DIAGNOSIS — I2510 Atherosclerotic heart disease of native coronary artery without angina pectoris: Secondary | ICD-10-CM | POA: Diagnosis not present

## 2015-09-02 LAB — CBC
HEMATOCRIT: 38.1 % — AB (ref 40.0–52.0)
HEMOGLOBIN: 13 g/dL (ref 13.0–18.0)
MCH: 29.5 pg (ref 26.0–34.0)
MCHC: 34 g/dL (ref 32.0–36.0)
MCV: 86.6 fL (ref 80.0–100.0)
Platelets: 122 10*3/uL — ABNORMAL LOW (ref 150–440)
RBC: 4.4 MIL/uL (ref 4.40–5.90)
RDW: 15.1 % — ABNORMAL HIGH (ref 11.5–14.5)
WBC: 7.1 10*3/uL (ref 3.8–10.6)

## 2015-09-04 ENCOUNTER — Other Ambulatory Visit: Payer: PPO

## 2015-09-04 ENCOUNTER — Ambulatory Visit: Payer: PPO | Admitting: Oncology

## 2015-09-06 NOTE — Progress Notes (Signed)
[facility]  [phone] [phone]  ID: Matthew Miles. OB: 1928-11-16  MR#: [id number]  OGA#:[id number]  Patient Care Team: Shelia Media, MD as PCP - General (Internal Medicine)  CHIEF COMPLAINT:  Chief Complaint  Patient presents with  . thrombocytopenia    INTERVAL HISTORY: Patient returns to clinic today for repeat laboratory work and further evaluation. He continues to feel well and is asymptomatic.  He denies any easy bleeding or bruising.  He has a good appetite and denies weight loss.  He denies any fevers or illnesses.  He has no chest pain or shortness of breath.  He denies any nausea, vomiting, constipation, or diarrhea.  He has no urinary complaints.  Patient offers no specific complaints today.  REVIEW OF SYSTEMS:   Review of Systems  Constitutional: Negative for fever, weight loss and malaise/fatigue.  Respiratory: Negative.  Negative for cough, hemoptysis and shortness of breath.   Cardiovascular: Negative.  Negative for chest pain.  Gastrointestinal: Negative.  Negative for blood in stool and melena.  Musculoskeletal: Negative.   Neurological: Negative.  Negative for weakness.  Endo/Heme/Allergies: Does not bruise/bleed easily.  Psychiatric/Behavioral: Negative.     As per HPI. Otherwise, a complete review of systems is negatve.  PAST MEDICAL HISTORY: Past Medical History  Diagnosis Date  . COPD (chronic obstructive pulmonary disease) (HCC)   . Smoker   . Mild hypertension   . Hypercholesterolemia   . Upper GI bleed 1980  . Hematemesis/vomiting blood 04/10/11  . Prostate cancer (HCC)   . Prostate cancer [facility])     radiation therapy   . Coronary artery disease   . Heart murmur   . Arthritis   . Chicken pox   . Emphysema of lung (HCC)   . GERD (gastroesophageal reflux disease)   . Ulcer   . Allergy   . Colon polyps   . Cholecystitis   . Pancreatitis   . CHF (congestive heart failure) (HCC)     PAST  SURGICAL HISTORY: Past Surgical History  Procedure Laterality Date  . Appendectomy    . Stomach surgery      bleeding ulcers, followed by Dr. Markham Jordan  . Cardiac catheterization  May 2002 and Feb 2013    [facility]; no stents   . Circumcision    . Sp cholecystomy      FAMILY HISTORY Family History  Problem Relation Age of Onset  . Emphysema Sister     smoker  . Prostate cancer Father   . Liver cancer Brother   . Prostate cancer Brother        ADVANCED DIRECTIVES:    HEALTH MAINTENANCE: Social History  Substance Use Topics  . Smoking status: Former Smoker -- 1.00 packs/day for 65 years    Types: Cigarettes    Quit date: 04/10/2011  . Smokeless tobacco: Former Neurosurgeon    Types: Chew  . Alcohol Use: No     Colonoscopy:  PAP:  Bone density:  Lipid panel:  Allergies  Allergen Reactions  . Sulfa Antibiotics     GI upset  . Tradjenta [Linagliptin]     Hair loss    Current Outpatient Prescriptions  Medication Sig Dispense Refill  . aspirin 81 MG tablet Take 81 mg by mouth daily.    . Blood Glucose Monitoring Suppl (ONE TOUCH ULTRA SYSTEM KIT) w/Device KIT 1 kit by Does not apply route once. 1 each 0  . Blood Glucose Monitoring Suppl W/DEVICE KIT Accu-chek Aviva Plus. Use as directed  to check blood sugar two times a day. Dx. E11.65 1 each 0  . carvedilol (COREG) 3.125 MG tablet Take 1 tablet (3.125 mg total) by mouth 2 (two) times daily. 180 tablet 3  . Cyanocobalamin (B-12) 100 MCG TABS Take by mouth daily.    . cyclobenzaprine (FLEXERIL) 5 MG tablet Take 1 tablet (5 mg total) by mouth 3 (three) times daily as needed for muscle spasms. 30 tablet 1  . glipiZIDE (GLUCOTROL XL) 10 MG 24 hr tablet Take 1 tablet (10 mg total) by mouth 2 (two) times daily. 180 tablet 3  . glucose blood test strip One touch ultra test strips. Use to check blood sugar two times a day. Dx. E11.9 200 each 11  . nitroGLYCERIN (NITROSTAT) 0.4 MG SL tablet Place 1 tablet (0.4 mg total) under the tongue  every 5 (five) minutes as needed. 60 tablet 0  . omeprazole (PRILOSEC) 20 MG capsule Take 1 capsule (20 mg total) by mouth 2 (two) times daily before a meal. 180 capsule 3  . potassium chloride SA (K-DUR,KLOR-CON) 20 MEQ tablet Take 1 tablet (20 mEq total) by mouth daily. 90 tablet 3  . pravastatin (PRAVACHOL) 40 MG tablet Take 1 tablet (40 mg total) by mouth daily. 90 tablet 3  . torsemide (DEMADEX) 10 MG tablet Take 1 tablet (10 mg total) by mouth 2 (two) times daily as needed. 180 tablet 3   No current facility-administered medications for this visit.    OBJECTIVE: Filed Vitals:   09/02/15 1103  BP: 133/62  Pulse: 80  Resp: 18     Body mass index is 33.44 kg/(m^2).    ECOG FS:0 - Asymptomatic  General: Well-developed, well-nourished, no acute distress. Eyes: Pink conjunctiva, anicteric sclera. Lungs: Clear to auscultation bilaterally. Heart: Regular rate and rhythm. No rubs, murmurs, or gallops. Abdomen: Soft, nontender, nondistended. No organomegaly noted, normoactive bowel sounds. Musculoskeletal: No edema, cyanosis, or clubbing. Neuro: Alert, answering all questions appropriately. Cranial nerves grossly intact. Skin: No rashes or petechiae noted. Psych: Normal affect.   LAB RESULTS:  Lab Results  Component Value Date   NA 135 08/05/2015   K 3.9 08/05/2015   CL 100 08/05/2015   CO2 27 08/05/2015   GLUCOSE 268* 08/05/2015   BUN 29* 08/05/2015   CREATININE 1.30 08/05/2015   CALCIUM 8.9 08/05/2015   PROT 6.3 08/05/2015   ALBUMIN 4.1 08/05/2015   AST 14 08/05/2015   ALT 15 08/05/2015   ALKPHOS 68 08/05/2015   BILITOT 0.5 08/05/2015   GFRNONAA >60 08/09/2012   GFRAA >60 08/09/2012    Lab Results  Component Value Date   WBC 7.1 09/02/2015   NEUTROABS 4.5 04/27/2015   HGB 13.0 09/02/2015   HCT 38.1* 09/02/2015   MCV 86.6 09/02/2015   PLT 122* 09/02/2015     STUDIES: No results found.  ASSESSMENT:  Thrombocytopenia.  PLAN:    1.  Thrombocytopenia:  Patient has a decreased platelet count of 122, which is essentially unchanged. Patient noted to have positive platelet antibody on repeat testing in January 2017. These can be transient nature and we will consider repeating test in the future. The remainder of his laboratory work was either negative or within normal limits. No intervention is needed at this time.  Patient does not require bone marrow biopsy. Return to clinic in 6 months with repeat laboratory work and further evaluation.    Patient expressed understanding and was in agreement with this plan. He also understands that He can call clinic  at any time with any questions, concerns, or complaints.   Lloyd Huger, MD   09/06/2015 4:29 PM

## 2015-09-24 ENCOUNTER — Encounter: Payer: Self-pay | Admitting: Cardiovascular Disease

## 2015-09-24 ENCOUNTER — Ambulatory Visit (INDEPENDENT_AMBULATORY_CARE_PROVIDER_SITE_OTHER): Payer: PPO | Admitting: Cardiovascular Disease

## 2015-09-24 VITALS — BP 126/58 | HR 81 | Ht 62.0 in | Wt 194.5 lb

## 2015-09-24 DIAGNOSIS — IMO0002 Reserved for concepts with insufficient information to code with codable children: Secondary | ICD-10-CM

## 2015-09-24 DIAGNOSIS — J449 Chronic obstructive pulmonary disease, unspecified: Secondary | ICD-10-CM

## 2015-09-24 DIAGNOSIS — I2510 Atherosclerotic heart disease of native coronary artery without angina pectoris: Secondary | ICD-10-CM | POA: Diagnosis not present

## 2015-09-24 DIAGNOSIS — I5032 Chronic diastolic (congestive) heart failure: Secondary | ICD-10-CM | POA: Diagnosis not present

## 2015-09-24 DIAGNOSIS — E1159 Type 2 diabetes mellitus with other circulatory complications: Secondary | ICD-10-CM

## 2015-09-24 DIAGNOSIS — I10 Essential (primary) hypertension: Secondary | ICD-10-CM

## 2015-09-24 DIAGNOSIS — I2583 Coronary atherosclerosis due to lipid rich plaque: Principal | ICD-10-CM

## 2015-09-24 DIAGNOSIS — E669 Obesity, unspecified: Secondary | ICD-10-CM

## 2015-09-24 DIAGNOSIS — E1165 Type 2 diabetes mellitus with hyperglycemia: Secondary | ICD-10-CM

## 2015-09-24 NOTE — Progress Notes (Signed)
Patient ID: Matthew Miles., male   DOB: [birth year], 82 y.o.   MRN: [id number] Cardiology Office Note  Date:  09/24/2015   ID:  Matthew Miles., DOB July 26, 1976, MRN [id number]  PCP:  Rica Mast, MD   Chief Complaint  Patient presents with  . other    6 month follow up. Meds reviewed by the patient verbally. "doing well."     HPI:  Matthew Miles is a 82 yo-old gentleman with history of coronary artery disease (occluded LAD), moderate pulmonary hypertension in early 2013, long history of smoking for 60 years, history of bronchitis requiring several courses of antibiotics at the end of 2012, prednisone and nebulizers with admission to the hospital in December for hematemesis and gastric ulcer seen on EGD, found to have COPD and oxygenation down to 83% with ambulation, nonsustained VT per the notes, acute renal failure who presents for followup Of his coronary artery disease Ejection fraction 55% in 2015  Trying to lose weight, HBA1C 7.5 Not much desserts, No exercise, No angina sx, stable mild SOB, Total chol 130s  With new meter, glucose >200  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 Taking  torsemide 10 mg BID, sweating more, weight stable  Was on 20 mg in the morning, 10 mg in the evening Has chronic mild leg edema  EKG on today's visit shows normal sinus rhythm with rate 81 bpm, left anterior fascicular block, old anterior MI  Other past medical history Chronic low back pain Reports that he had a bleeding ulcer 4 years ago at that time stop smoking Last echocardiogram in 2015 showing normal ejection fraction, right heart pressures 37 mmHg  left and right heart cath done in February 2013 showing pulmonary hypertension, stable severe coronary artery disease  Normal ejection fraction  Right heart catheterization showed significant fluid overload with wedge pressure of 24, moderate pulmonary hypertension. Catheterization showed occluded LAD which was old, no other  significant stenoses requiring intervention.  PMH:   has a past medical history of COPD (chronic obstructive pulmonary disease) ([place]); Smoker; Mild hypertension; Hypercholesterolemia; Upper GI bleed (1980); Hematemesis/vomiting blood (04/10/11); Prostate cancer ([place]); Prostate cancer ([place]); Coronary artery disease; Heart murmur; Arthritis; Chicken pox; Emphysema of lung ([place]); GERD (gastroesophageal reflux disease); Ulcer; Allergy; Colon polyps; Cholecystitis; Pancreatitis; and CHF (congestive heart failure) ([place]).  PSH:    Past Surgical History  Procedure Laterality Date  . Appendectomy    . Stomach surgery      bleeding ulcers, followed by Dr. [name]  . Cardiac catheterization  May 2002 and Feb 2013    [facility]; no stents   . Circumcision    . Sp cholecystomy      Current Outpatient Prescriptions  Medication Sig Dispense Refill  . aspirin 81 MG tablet Take 81 mg by mouth daily.    . Blood Glucose Monitoring Suppl (ONE TOUCH ULTRA SYSTEM KIT) w/Device KIT 1 kit by Does not apply route once. 1 each 0  . Blood Glucose Monitoring Suppl W/DEVICE KIT Accu-chek Aviva Plus. Use as directed to check blood sugar two times a day. Dx. E11.65 1 each 0  . carvedilol (COREG) 3.125 MG tablet Take 1 tablet (3.125 mg total) by mouth 2 (two) times daily. 180 tablet 3  . Cyanocobalamin (B-12) 100 MCG TABS Take by mouth daily.    . cyclobenzaprine (FLEXERIL) 5 MG tablet Take 1 tablet (5 mg total) by mouth 3 (three) times daily as needed for muscle spasms. 30 tablet 1  . glipiZIDE (GLUCOTROL XL) 10  MG 24 hr tablet Take 1 tablet (10 mg total) by mouth 2 (two) times daily. 180 tablet 3  . glucose blood test strip One touch ultra test strips. Use to check blood sugar two times a day. Dx. E11.9 200 each 11  . nitroGLYCERIN (NITROSTAT) 0.4 MG SL tablet Place 1 tablet (0.4 mg total) under the tongue every 5 (five) minutes as needed. 60 tablet 0  . omeprazole (PRILOSEC) 20 MG capsule Take 1 capsule (20 mg total) by  mouth 2 (two) times daily before a meal. 180 capsule 3  . potassium chloride SA (K-DUR,KLOR-CON) 20 MEQ tablet Take 1 tablet (20 mEq total) by mouth daily. 90 tablet 3  . pravastatin (PRAVACHOL) 40 MG tablet Take 1 tablet (40 mg total) by mouth daily. 90 tablet 3  . torsemide (DEMADEX) 10 MG tablet Take 1 tablet (10 mg total) by mouth 2 (two) times daily as needed. 180 tablet 3   No current facility-administered medications for this visit.     Allergies:   Sulfa antibiotics and Tradjenta   Social History:  The patient  reports that he quit smoking about 4 years ago. His smoking use included Cigarettes. He has a 65 pack-year smoking history. He has quit using smokeless tobacco. His smokeless tobacco use included Chew. He reports that he does not drink alcohol or use illicit drugs.   Family History:   family history includes Emphysema in his sister; Liver cancer in his brother; Prostate cancer in his brother and father.    Review of Systems: Review of Systems  Constitutional: Negative.   Respiratory: Positive for shortness of breath.   Cardiovascular: Positive for leg swelling.  Gastrointestinal: Negative.   Musculoskeletal: Negative.   Neurological: Negative.   Psychiatric/Behavioral: Negative.   All other systems reviewed and are negative.    PHYSICAL EXAM: VS:  BP 126/58 mmHg  Pulse 81  Ht 5' 2"  (1.575 m)  Wt 194 lb 8 oz (88.225 kg)  BMI 35.57 kg/m2 , BMI Body mass index is 35.57 kg/(m^2). GEN: Well nourished, well developed, in no acute distress, obese HEENT: normal Neck: no JVD, carotid bruits, or masses Cardiac: RRR; 2+ murmur rsb,, no rubs, or gallops, Trace LE  edema  Respiratory:  clear to auscultation bilaterally, normal work of breathing GI: soft, nontender, nondistended, + BS MS: no deformity or atrophy Skin: warm and dry, no rash Neuro:  Strength and sensation are intact Psych: euthymic mood, full affect    Recent Labs: 08/05/2015: ALT 15; BUN 29*;  Creatinine, Ser 1.30; Potassium 3.9; Sodium 135 09/02/2015: Hemoglobin 13.0; Platelets 122*    Lipid Panel Lab Results  Component Value Date   CHOL 135 08/05/2015   HDL 35.10* 08/05/2015   LDLCALC 68 01/22/2015   TRIG 310.0* 08/05/2015      Wt Readings from Last 3 Encounters:  09/24/15 194 lb 8 oz (88.225 kg)  09/02/15 188 lb 11.4 oz (85.6 kg)  08/05/15 191 lb 1.9 oz (86.691 kg)       ASSESSMENT AND PLAN:  Coronary artery disease due to lipid rich plaque - Plan: EKG 12-Lead Currently with no symptoms of angina. No further workup at this time. Continue current medication regimen.  Essential hypertension - Plan: EKG 12-Lead Blood pressure is well controlled on today's visit. No changes made to the medications.  Chronic diastolic CHF (congestive heart failure) (HCC) -  Euvolemic,   Chronic obstructive pulmonary disease, unspecified COPD type ([place]) Stable, stopped smoking years ago  Uncontrolled type 2 diabetes mellitus  with other circulatory complication, without long-term current use of insulin (HCC)  Obesity (BMI 30-39.9) We have encouraged continued exercise, careful diet management in an effort to lose weight.    Total encounter time more than 15 minutes  Greater than 50% was spent in counseling and coordination of care with the patient   Disposition:   F/U  6 months   Orders Placed This Encounter  Procedures  . EKG 12-Lead     Signed, Esmond Plants, M.D., Ph.D. 09/24/2015  [place], [place]

## 2015-09-24 NOTE — Patient Instructions (Addendum)
You are doing well. No medication changes were made.  Please take extra torsemide for weight gain, leg swelling, shortness of breath  Please call us if you have new issues that need to be addressed before your next appt.  Your physician wants you to follow-up in: 6 months.  You will receive a reminder letter in the mail two months in advance. If you don't receive a letter, please call our office to schedule the follow-up appointment.

## 2015-11-05 ENCOUNTER — Encounter: Payer: Self-pay | Admitting: Internal Medicine

## 2015-11-05 ENCOUNTER — Ambulatory Visit (INDEPENDENT_AMBULATORY_CARE_PROVIDER_SITE_OTHER): Payer: PPO | Admitting: Internal Medicine

## 2015-11-05 VITALS — BP 128/58 | HR 88 | Wt 192.4 lb

## 2015-11-05 DIAGNOSIS — I10 Essential (primary) hypertension: Secondary | ICD-10-CM

## 2015-11-05 DIAGNOSIS — IMO0002 Reserved for concepts with insufficient information to code with codable children: Secondary | ICD-10-CM

## 2015-11-05 DIAGNOSIS — E1165 Type 2 diabetes mellitus with hyperglycemia: Secondary | ICD-10-CM

## 2015-11-05 DIAGNOSIS — E1159 Type 2 diabetes mellitus with other circulatory complications: Secondary | ICD-10-CM | POA: Diagnosis not present

## 2015-11-05 DIAGNOSIS — J449 Chronic obstructive pulmonary disease, unspecified: Secondary | ICD-10-CM | POA: Diagnosis not present

## 2015-11-05 LAB — COMPREHENSIVE METABOLIC PANEL
ALBUMIN: 4.3 g/dL (ref 3.5–5.2)
ALT: 16 U/L (ref 0–53)
AST: 15 U/L (ref 0–37)
Alkaline Phosphatase: 65 U/L (ref 39–117)
BILIRUBIN TOTAL: 0.7 mg/dL (ref 0.2–1.2)
BUN: 27 mg/dL — AB (ref 6–23)
CALCIUM: 9.4 mg/dL (ref 8.4–10.5)
CHLORIDE: 101 meq/L (ref 96–112)
CO2: 29 meq/L (ref 19–32)
CREATININE: 1.42 mg/dL (ref 0.40–1.50)
GFR: 50.37 mL/min — ABNORMAL LOW (ref >60.00)
Glucose, Bld: 163 mg/dL — ABNORMAL HIGH (ref 70–99)
Potassium: 4.5 mEq/L (ref 3.5–5.1)
SODIUM: 138 meq/L (ref 135–145)
Total Protein: 6.7 g/dL (ref 6.0–8.3)

## 2015-11-05 LAB — HEMOGLOBIN A1C: HEMOGLOBIN A1C: 8.2 % — AB (ref 4.6–6.5)

## 2015-11-05 NOTE — Patient Instructions (Addendum)
Labs today.  Follow up in 4 weeks. 

## 2015-11-05 NOTE — Progress Notes (Signed)
Subjective:    Patient ID: Matthew Blank., male    DOB: [birth year], 82 y.o.   MRN: [id number]  HPI  82YO male presents for follow up.  DM - BG running high, often over 200. Compliant with medications. Following a healthy diet. Limiting bread, sugars.   Lab Results  Component Value Date   HGBA1C 7.5 (H) 08/05/2015   COPD - Some shortness of breath with hotter temps. Feeling better today. No cough or wheezing.   Wt Readings from Last 3 Encounters:  11/05/15 192 lb 6.4 oz (87.3 kg)  09/24/15 194 lb 8 oz (88.2 kg)  09/02/15 188 lb 11.4 oz (85.6 kg)   BP Readings from Last 3 Encounters:  11/05/15 (!) 128/58  09/24/15 (!) 126/58  09/02/15 133/62    Past Medical History:  Diagnosis Date  . Allergy   . Arthritis   . CHF (congestive heart failure) ([place])   . Chicken pox   . Cholecystitis   . Colon polyps   . COPD (chronic obstructive pulmonary disease) ([place])   . Coronary artery disease   . Emphysema of lung ([place])   . GERD (gastroesophageal reflux disease)   . Heart murmur   . Hematemesis/vomiting blood 04/10/11  . Hypercholesterolemia   . Mild hypertension   . Pancreatitis   . Prostate cancer ([place])   . Prostate cancer [facility])    radiation therapy   . Smoker   . Ulcer   . Upper GI bleed 1980   Family History  Problem Relation Age of Onset  . Emphysema Sister     smoker  . Prostate cancer Father   . Liver cancer Brother   . Prostate cancer Brother    Past Surgical History:  Procedure Laterality Date  . APPENDECTOMY    . CARDIAC CATHETERIZATION  May 2002 and Feb 2013   [facility]; no stents   . CIRCUMCISION    . SP CHOLECYSTOMY    . STOMACH SURGERY     bleeding ulcers, followed by Dr. [name]   Social History   Social History  . Marital status: Married    Spouse name: N/A  . Number of children: 2  . Years of education: N/A   Occupational History  . 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  [employer]   Social History Main Topics  . Smoking status: Former Smoker      Packs/day: 1.00    Years: 65.00    Types: Cigarettes    Quit date: 04/10/2011  . Smokeless tobacco: Former Systems developer    Types: Chew  . Alcohol use No  . Drug use: No  . Sexual activity: Not Currently   Other Topics Concern  . None   Social History Narrative   Lives in [place] alone. Wife in nursing home. Has 2 children.      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  Eyes: Negative for visual disturbance.  Respiratory: Positive for shortness of breath. Negative for cough, chest tightness and wheezing.   Cardiovascular: Negative for chest pain, palpitations and leg swelling.  Gastrointestinal: Negative for abdominal distention and abdominal pain.  Genitourinary: Negative for difficulty urinating, dysuria and urgency.  Musculoskeletal: Negative for arthralgias and gait problem.  Skin: Negative for color change and rash.  Hematological: Negative for adenopathy.  Psychiatric/Behavioral:  Negative for dysphoric mood and sleep disturbance. The patient is not nervous/anxious.        Objective:    BP (!) 128/58 (BP Location: Right Arm, Patient Position: Sitting, Cuff Size: Large)   Pulse 88   Wt 192 lb 6.4 oz (87.3 kg)   SpO2 96%   BMI 35.19 kg/m  Physical Exam  Constitutional: He is oriented to person, place, and time. He appears well-developed and well-nourished. No distress.  HENT:  Head: Normocephalic and atraumatic.  Right Ear: External ear normal.  Left Ear: External ear normal.  Nose: Nose normal.  Mouth/Throat: Oropharynx is clear and moist. No oropharyngeal exudate.  Eyes: Conjunctivae and EOM are normal. Pupils are equal, round, and reactive to light. Right eye exhibits no discharge. Left eye exhibits no discharge. No scleral icterus.  Neck: Normal range of motion. Neck supple. No tracheal  deviation present. No thyromegaly present.  Cardiovascular: Normal rate and regular rhythm.  Exam reveals no gallop and no friction rub.   Murmur heard. Pulmonary/Chest: Effort normal and breath sounds normal. No accessory muscle usage. No tachypnea. No respiratory distress. He has no decreased breath sounds. He has no wheezes. He has no rhonchi. He has no rales. He exhibits no tenderness.  Musculoskeletal: Normal range of motion. He exhibits no edema.  Lymphadenopathy:    He has no cervical adenopathy.  Neurological: He is alert and oriented to person, place, and time. No cranial nerve deficit. Coordination normal.  Skin: Skin is warm and dry. No rash noted. He is not diaphoretic. No erythema. No pallor.  Psychiatric: He has a normal mood and affect. His behavior is normal. Judgment and thought content normal.          Assessment & Plan:   Problem List Items Addressed This Visit      Unprioritized   COPD (chronic obstructive pulmonary disease) ([place]) (Chronic)    Symptoms generally well controlled. Continue to monitor.      Diabetes mellitus type 2, uncontrolled ([place]) - Primary (Chronic)    BG elevated by report. Will check A1c with labs. Discussed adding a long acting insulin. Will wait until labs back prior to changing medications. Encouraged continued healthy diet.      Relevant Orders   Comprehensive metabolic panel   Hemoglobin A1c   Essential hypertension (Chronic)    BP Readings from Last 3 Encounters:  11/05/15 (!) 128/58  09/24/15 (!) 126/58  09/02/15 133/62   BP well controlled. Continue current medications. Renal function with labs.       Other Visit Diagnoses   None.      Return in about 4 weeks (around 12/03/2015) for New Patient.  Ronette Deter, MD Internal [facility]

## 2015-11-05 NOTE — Assessment & Plan Note (Signed)
BP Readings from Last 3 Encounters:  11/05/15 (!) 128/58  09/24/15 (!) 126/58  09/02/15 133/62   BP well controlled. Continue current medications. Renal function with labs.

## 2015-11-05 NOTE — Assessment & Plan Note (Signed)
BG elevated by report. Will check A1c with labs. Discussed adding a long acting insulin. Will wait until labs back prior to changing medications. Encouraged continued healthy diet.

## 2015-11-05 NOTE — Assessment & Plan Note (Signed)
Symptoms generally well controlled. Continue to monitor.

## 2015-11-05 NOTE — Progress Notes (Signed)
Pre visit review using our clinic review tool, if applicable. No additional management support is needed unless otherwise documented below in the visit note. 

## 2015-11-06 ENCOUNTER — Telehealth: Payer: Self-pay | Admitting: *Deleted

## 2015-11-06 ENCOUNTER — Telehealth: Payer: Self-pay

## 2015-11-06 MED ORDER — SITAGLIPTIN PHOSPHATE 25 MG PO TABS: 25.0000 mg | ORAL_TABLET | Freq: Every day | ORAL | 3 refills | Status: DC

## 2015-11-06 NOTE — Telephone Encounter (Signed)
Spoke with patient, reviewed results, he would like the [place]. Thanks see result note.

## 2015-11-06 NOTE — Telephone Encounter (Signed)
Please advise if he needs to continue the below and take the new one, thanks

## 2015-11-06 NOTE — Telephone Encounter (Signed)
Yes, I was going to have him add the low dose of Januvia to the Glipizide. He will need to monitor blood sugars 2-3 times per day at first, as the combination may result in lower BG.

## 2015-11-06 NOTE — Telephone Encounter (Signed)
Left a detailed message for the patients daughter with the plan, thanks

## 2015-11-06 NOTE — Telephone Encounter (Signed)
Patient called and is inquiring about his lab results. Patient is requesting a call back. His number is [phone]. Thanks

## 2015-11-06 NOTE — Telephone Encounter (Signed)
Patient daughter questioned if he should discontinue his previous Rx for glipizide to start [facility]  Daughter [name] [phone]

## 2015-11-06 NOTE — Telephone Encounter (Signed)
n has been ordered per lab results/ Dr. Gilford Rile.

## 2015-12-24 ENCOUNTER — Ambulatory Visit (INDEPENDENT_AMBULATORY_CARE_PROVIDER_SITE_OTHER): Payer: PPO | Admitting: Family Medicine

## 2015-12-24 ENCOUNTER — Encounter: Payer: Self-pay | Admitting: Family Medicine

## 2015-12-24 VITALS — BP 128/66 | HR 90 | Temp 98.4°F | Wt 191.6 lb

## 2015-12-24 DIAGNOSIS — L570 Actinic keratosis: Secondary | ICD-10-CM | POA: Diagnosis not present

## 2015-12-24 DIAGNOSIS — Z23 Encounter for immunization: Secondary | ICD-10-CM

## 2015-12-24 DIAGNOSIS — M19041 Primary osteoarthritis, right hand: Secondary | ICD-10-CM | POA: Insufficient documentation

## 2015-12-24 DIAGNOSIS — E785 Hyperlipidemia, unspecified: Secondary | ICD-10-CM | POA: Diagnosis not present

## 2015-12-24 DIAGNOSIS — M19042 Primary osteoarthritis, left hand: Secondary | ICD-10-CM

## 2015-12-24 MED ORDER — SITAGLIPTIN PHOSPHATE 50 MG PO TABS: 50.0000 mg | ORAL_TABLET | Freq: Every day | ORAL | 2 refills | Status: DC

## 2015-12-24 NOTE — Progress Notes (Signed)
  [name], MD Phone: [phone]  Matthew Miles. is a 82 y.o. male who presents today for f/u.  HYPERTENSION Disease Monitoring: Blood pressure range-typically similar to today Chest pain- no      Dyspnea- no Medications: Compliance- taking carvedilol and torsemide    Edema- no  DIABETES Disease Monitoring: Blood Sugar ranges-between 178 and low 200s checking daily Polyuria/phagia/dipsia- no      ophthalmology- up-to-date  Medications: Compliance- taking glipizide and Januvia Hypoglycemic symptoms- no   HYPERLIPIDEMIA Disease Monitoring: See symptoms for Hypertension Medications: Compliance- taking pravastatin Right upper quadrant pain- no  Muscle aches- no  patient additionally notes a few rough red spots on his bilateral cheeks. Has not been evaluated by dermatology in a number of years.  Notes he has arthritis in his hands. Takes vinegar or mustard and has no pain with this.   PMH: former smoker   ROS see history of present illness  Objective  Physical Exam Vitals:   12/24/15 1345  BP: 128/66  Pulse: 90  Temp: 98.4 F (36.9 C)    BP Readings from Last 3 Encounters:  12/24/15 128/66  11/05/15 (!) 128/58  09/24/15 (!) 126/58   Wt Readings from Last 3 Encounters:  12/24/15 191 lb 9.6 oz (86.9 kg)  11/05/15 192 lb 6.4 oz (87.3 kg)  09/24/15 194 lb 8 oz (88.2 kg)    Physical Exam  Constitutional: No distress.  HENT:  Head: Normocephalic and atraumatic.    Cardiovascular: Normal rate and regular rhythm.   Murmur (2/6 systolic murmur) heard. Pulmonary/Chest: Effort normal and breath sounds normal.  Musculoskeletal:  No joint tenderness in bilateral hands, he does have contracture in bilateral 5th fingers  Neurological: He is alert. Gait normal.  Skin: Skin is warm and dry. He is not diaphoretic.     Assessment/Plan: Please see individual problem list.  Essential hypertension At goal. Continue current medications.  Diabetes mellitus  type 2, uncontrolled ([place]) Still uncontrolled. Patient's creatinine clearance is calculated to be 47. He can tolerate an increased dose of Januvia. We will increase Januvia to the maximum dose tolerated by his creatinine clearance. Januvia 50 mg once daily. He will take two 25 mg tablets until his current prescription is gone and then he will pick up the new prescription. He'll continue the glipizide. He'll continue to monitor his blood sugars. If his blood glucose is persistently less than 100 he will let us know. He was additionally advised on diet. Follow-up in 6 weeks for A1c.  Hyperlipidemia Tolerating pravastatin. We will continue this medication.  Actinic keratoses Areas on face concerning for actinic keratoses. We will refer to dermatology.  Osteoarthritis of both hands Seems to be well controlled with use of vinegar or mustard. He'll continue to monitor.   Orders Placed This Encounter  Procedures  . Flu vaccine HIGH DOSE PF    Meds ordered this encounter  Medications  . sitaGLIPtin (JANUVIA) 50 MG tablet    Sig: Take 1 tablet (50 mg total) by mouth daily.    Dispense:  30 tablet    Refill:  2    [name], MD [place]

## 2015-12-24 NOTE — Assessment & Plan Note (Addendum)
Still uncontrolled. Patient's creatinine clearance is calculated to be 47. He can tolerate an increased dose of Januvia. We will increase Januvia to the maximum dose tolerated by his creatinine clearance. Januvia 50 mg once daily. He will take two 25 mg tablets until his current prescription is gone and then he will pick up the new prescription. He'll continue the glipizide. He'll continue to monitor his blood sugars. If his blood glucose is persistently less than 100 he will let us know. He was additionally advised on diet. Follow-up in 6 weeks for A1c.

## 2015-12-24 NOTE — Assessment & Plan Note (Signed)
Areas on face concerning for actinic keratoses. We will refer to dermatology.

## 2015-12-24 NOTE — Assessment & Plan Note (Signed)
Seems to be well controlled with use of vinegar or mustard. He'll continue to monitor.

## 2015-12-24 NOTE — Patient Instructions (Signed)
Nice to meet you. We are going to increase your Januvia to 50 mg daily. Please monitor your blood sugars. You should eat more vegetables as well.  We will refer you to dermatology. Continue current medications otherwise.

## 2015-12-24 NOTE — Assessment & Plan Note (Signed)
At goal. Continue current medications. 

## 2015-12-24 NOTE — Progress Notes (Signed)
Pre visit review using our clinic review tool, if applicable. No additional management support is needed unless otherwise documented below in the visit note. 

## 2015-12-24 NOTE — Assessment & Plan Note (Signed)
Tolerating pravastatin. We will continue this medication.

## 2015-12-28 ENCOUNTER — Encounter: Payer: Self-pay | Admitting: Family Medicine

## 2016-01-28 DIAGNOSIS — L821 Other seborrheic keratosis: Secondary | ICD-10-CM | POA: Diagnosis not present

## 2016-01-28 DIAGNOSIS — L570 Actinic keratosis: Secondary | ICD-10-CM | POA: Diagnosis not present

## 2016-01-28 DIAGNOSIS — L720 Epidermal cyst: Secondary | ICD-10-CM | POA: Diagnosis not present

## 2016-01-28 DIAGNOSIS — X32XXXA Exposure to sunlight, initial encounter: Secondary | ICD-10-CM | POA: Diagnosis not present

## 2016-02-03 ENCOUNTER — Ambulatory Visit (INDEPENDENT_AMBULATORY_CARE_PROVIDER_SITE_OTHER): Payer: PPO | Admitting: Family Medicine

## 2016-02-03 ENCOUNTER — Encounter: Payer: Self-pay | Admitting: Family Medicine

## 2016-02-03 ENCOUNTER — Telehealth: Payer: Self-pay | Admitting: *Deleted

## 2016-02-03 VITALS — BP 106/54 | HR 62 | Temp 98.0°F | Wt 192.6 lb

## 2016-02-03 DIAGNOSIS — H1013 Acute atopic conjunctivitis, bilateral: Secondary | ICD-10-CM | POA: Diagnosis not present

## 2016-02-03 DIAGNOSIS — E1165 Type 2 diabetes mellitus with hyperglycemia: Secondary | ICD-10-CM

## 2016-02-03 DIAGNOSIS — E1159 Type 2 diabetes mellitus with other circulatory complications: Secondary | ICD-10-CM

## 2016-02-03 DIAGNOSIS — Z8546 Personal history of malignant neoplasm of prostate: Secondary | ICD-10-CM

## 2016-02-03 DIAGNOSIS — IMO0002 Reserved for concepts with insufficient information to code with codable children: Secondary | ICD-10-CM

## 2016-02-03 DIAGNOSIS — J449 Chronic obstructive pulmonary disease, unspecified: Secondary | ICD-10-CM

## 2016-02-03 DIAGNOSIS — H1010 Acute atopic conjunctivitis, unspecified eye: Secondary | ICD-10-CM | POA: Insufficient documentation

## 2016-02-03 DIAGNOSIS — M25551 Pain in right hip: Secondary | ICD-10-CM | POA: Insufficient documentation

## 2016-02-03 LAB — HEMOGLOBIN A1C: Hgb A1c MFr Bld: 7.5 % — ABNORMAL HIGH (ref 4.6–6.5)

## 2016-02-03 MED ORDER — LORATADINE 10 MG PO TABS: 10.0000 mg | ORAL_TABLET | Freq: Every day | ORAL | 1 refills | Status: DC

## 2016-02-03 MED ORDER — SITAGLIPTIN PHOSPHATE 50 MG PO TABS: 50.0000 mg | ORAL_TABLET | Freq: Every day | ORAL | 3 refills | Status: DC

## 2016-02-03 NOTE — Patient Instructions (Signed)
Nice to see you. We'll check your A1c today and call you with the results. You should continue your glipizide and Januvia. Please monitor your right hip and if the pain returns please let us know. Please monitor your breathing and eye wateriness. You can start on Claritin to help with your eyes.

## 2016-02-03 NOTE — Progress Notes (Signed)
Pre visit review using our clinic review tool, if applicable. No additional management support is needed unless otherwise documented below in the visit note. 

## 2016-02-03 NOTE — Assessment & Plan Note (Signed)
Improved blood sugars at home. Continue glipizide and Januvia. Check an A1c today.

## 2016-02-03 NOTE — Assessment & Plan Note (Signed)
No discomfort today. Has improved. Suspect arthritis given his description. Continue to monitor for recurrence.

## 2016-02-03 NOTE — Assessment & Plan Note (Signed)
Has been sometime since his last PSA. We will check this today. He reports he has not seen urology in some time as well.

## 2016-02-03 NOTE — Progress Notes (Signed)
  [name], MD Phone: [phone]  Matthew Miles. is a 82 y.o. male who presents today for f/u  DIABETES Disease Monitoring: Blood Sugar ranges-161 this morning Polyuria/phagia/dipsia- no      Visual problems- no Medications: Compliance- taking Januvia and glipizide Hypoglycemic symptoms- no  COPD: Reports he quit smoking 5 years ago. Rare cough. Nonproductive. Minimal shortness of breath if he walks for a very long period of time in the heat. Otherwise no shortness of breath. No chest pain.  Patient notes intermittent eye wateriness. Also some postnasal drip. Previously saw eye doctor for the eyes and they prescribed him some eyedrops though these were not of benefit. He's never taken Claritin. No other upper respiratory symptoms.  Patient notes his right hip was bothering him [place]. Notes sometimes it hurts with long periods of inactivity. Advil did help. No injury. No radiation. No pain currently.  Patient does have a history of prostate cancer. Reports it has been sometime since his last PSA.  PMH: Former smoker   ROS see history of present illness  Objective  Physical Exam Vitals:   02/03/16 1448  BP: (!) 106/54  Pulse: 62  Temp: 98 F (36.7 C)    BP Readings from Last 3 Encounters:  02/03/16 (!) 106/54  12/24/15 128/66  11/05/15 (!) 128/58   Wt Readings from Last 3 Encounters:  02/03/16 192 lb 9.6 oz (87.4 kg)  12/24/15 191 lb 9.6 oz (86.9 kg)  11/05/15 192 lb 6.4 oz (87.3 kg)    Physical Exam  Constitutional: No distress.  HENT:  Head: Normocephalic and atraumatic.  Mouth/Throat: Oropharynx is clear and moist.  Eyes: Conjunctivae are normal. Pupils are equal, round, and reactive to light.  Clear wateriness to eyes  Cardiovascular: Normal rate and regular rhythm.   Pulmonary/Chest: Effort normal and breath sounds normal.  Musculoskeletal:  No tenderness of bilateral hips, no discomfort on internal or  external range of motion, full range of motion on internal and external range of motion  Neurological: He is alert. Gait normal.  Skin: Skin is warm and dry. He is not diaphoretic.     Assessment/Plan: Please see individual problem list.  COPD (chronic obstructive pulmonary disease) ([place]) Symptoms generally well controlled. Continue to monitor.  Diabetes mellitus type 2, uncontrolled ([place]) Improved blood sugars at home. Continue glipizide and Januvia. Check an A1c today.  H/O malignant neoplasm of prostate Has been sometime since his last PSA. We will check this today. He reports he has not seen urology in some time as well.  Allergic conjunctivitis Patients bilateral eye symptoms most consistent with allergies. Discussed starting on Claritin.  Hip pain, right No discomfort today. Has improved. Suspect arthritis given his description. Continue to monitor for recurrence.   Orders Placed This Encounter  Procedures  . HgB A1c  . PSA    Standing Status:   Future    Standing Expiration Date:   02/02/2017    Meds ordered this encounter  Medications  . sitaGLIPtin (JANUVIA) 50 MG tablet    Sig: Take 1 tablet (50 mg total) by mouth daily.    Dispense:  90 tablet    Refill:  3  . loratadine (CLARITIN) 10 MG tablet    Sig: Take 1 tablet (10 mg total) by mouth daily.    Dispense:  90 tablet    Refill:  1    [name], MD [place]

## 2016-02-03 NOTE — Assessment & Plan Note (Signed)
Symptoms generally well controlled. Continue to monitor.

## 2016-02-03 NOTE — Assessment & Plan Note (Signed)
Patients bilateral eye symptoms most consistent with allergies. Discussed starting on Claritin.

## 2016-02-03 NOTE — Telephone Encounter (Signed)
Left pt a voicemail to call back & schedule a lab appt to have PSA drawn. Order was added after he was drawn.

## 2016-02-04 ENCOUNTER — Other Ambulatory Visit (INDEPENDENT_AMBULATORY_CARE_PROVIDER_SITE_OTHER): Payer: PPO

## 2016-02-04 DIAGNOSIS — Z8546 Personal history of malignant neoplasm of prostate: Secondary | ICD-10-CM | POA: Diagnosis not present

## 2016-02-04 NOTE — Telephone Encounter (Signed)
Pt coming in this afternoon (2:15) to have PSA drawn.

## 2016-02-08 ENCOUNTER — Telehealth: Payer: Self-pay | Admitting: Family Medicine

## 2016-02-08 LAB — PSA: PSA: 2.5 ng/mL (ref 0.10–4.00)

## 2016-02-08 NOTE — Telephone Encounter (Signed)
Pt called back regarding results. Thank you!  Call pt @ [phone]

## 2016-02-08 NOTE — Telephone Encounter (Signed)
Gave patient results

## 2016-02-22 ENCOUNTER — Telehealth: Payer: Self-pay | Admitting: *Deleted

## 2016-02-22 DIAGNOSIS — R9721 Rising PSA following treatment for malignant neoplasm of prostate: Secondary | ICD-10-CM

## 2016-02-22 NOTE — Telephone Encounter (Signed)
Did you place a referral for this? I have called Dr. Rogers Blocker office and need medical release signed.

## 2016-02-22 NOTE — Telephone Encounter (Signed)
Pt daughter was under the impression that pt was to have an appt with urology due to lab results   [name] [phone]

## 2016-02-22 NOTE — Telephone Encounter (Signed)
I did not place a referral as I was awaiting records from Dr [facility] office. I think it would be reasonable for the patient to see urology given that we have not received any records. I will place a referral.

## 2016-02-22 NOTE — Telephone Encounter (Signed)
Notified patient's daughter that referral has been placed for urology.

## 2016-02-23 NOTE — Progress Notes (Signed)
[place]  [phone] [phone]  ID: [name] OB: Sep 12, 1928  MR#: [id number]  DGL#:[id number]  Patient Care Team: [name], MD as PCP - General (Family Medicine) Minna Merritts, MD as Consulting Physician (Cardiology)  CHIEF COMPLAINT:  Chief Complaint  Patient presents with  . Thrombocytopenia    INTERVAL HISTORY: Patient returns to clinic today for repeat laboratory work and further evaluation. He continues to feel well and is asymptomatic.  He denies any easy bleeding or bruising.  He has a good appetite and denies weight loss.  He denies any fevers or illnesses.  He has no chest pain or shortness of breath.  He denies any nausea, vomiting, constipation, or diarrhea.  He has no urinary complaints.  Patient offers no specific complaints today.  REVIEW OF SYSTEMS:   Review of Systems  Constitutional: Negative for fever, malaise/fatigue and weight loss.  Respiratory: Negative.  Negative for cough, hemoptysis and shortness of breath.   Cardiovascular: Negative.  Negative for chest pain and leg swelling.  Gastrointestinal: Negative.  Negative for blood in stool and melena.  Musculoskeletal: Negative.   Neurological: Negative.  Negative for weakness.  Endo/Heme/Allergies: Does not bruise/bleed easily.  Psychiatric/Behavioral: Negative.  The patient is not nervous/anxious.     As per HPI. Otherwise, a complete review of systems is negative.  PAST MEDICAL HISTORY: Past Medical History:  Diagnosis Date  . Allergy   . Arthritis   . CHF (congestive heart failure) ([place])   . Chicken pox   . Cholecystitis   . Colon polyps   . COPD (chronic obstructive pulmonary disease) ([place])   . Coronary artery disease   . Emphysema of lung ([place])   . GERD (gastroesophageal reflux disease)   . Heart murmur   . Hematemesis/vomiting blood 04/10/11  . Hypercholesterolemia   . Mild hypertension   . Pancreatitis   . Prostate cancer  ([place])   . Prostate cancer [facility])    radiation therapy   . Smoker   . Ulcer ([place])   . Upper GI bleed 1980    PAST SURGICAL HISTORY: Past Surgical History:  Procedure Laterality Date  . APPENDECTOMY    . CARDIAC CATHETERIZATION  May 2002 and Feb 2013   [facility]; no stents   . CIRCUMCISION    . SP CHOLECYSTOMY    . STOMACH SURGERY     bleeding ulcers, followed by Dr. [name]    FAMILY HISTORY Family History  Problem Relation Age of Onset  . Emphysema Sister     smoker  . Prostate cancer Father   . Liver cancer Brother   . Prostate cancer Brother        ADVANCED DIRECTIVES:    HEALTH MAINTENANCE: Social History  Substance Use Topics  . Smoking status: Former Smoker    Packs/day: 1.00    Years: 65.00    Types: Cigarettes    Quit date: 04/10/2011  . Smokeless tobacco: Former Systems developer    Types: Chew  . Alcohol use No     Colonoscopy:  PAP:  Bone density:  Lipid panel:  Allergies  Allergen Reactions  . Sulfa Antibiotics     GI upset  . Tradjenta [Linagliptin]     Hair loss    Current Outpatient Prescriptions  Medication Sig Dispense Refill  . aspirin 81 MG tablet Take 81 mg by mouth daily.    . Blood Glucose Monitoring Suppl (ONE TOUCH ULTRA SYSTEM KIT) w/Device KIT 1 kit by Does not apply  route once. 1 each 0  . Blood Glucose Monitoring Suppl W/DEVICE KIT Accu-chek Aviva Plus. Use as directed to check blood sugar two times a day. Dx. E11.65 1 each 0  . carvedilol (COREG) 3.125 MG tablet Take 1 tablet (3.125 mg total) by mouth 2 (two) times daily. 180 tablet 3  . Cyanocobalamin (B-12) 100 MCG TABS Take by mouth daily.    . cyclobenzaprine (FLEXERIL) 5 MG tablet Take 1 tablet (5 mg total) by mouth 3 (three) times daily as needed for muscle spasms. 30 tablet 1  . glipiZIDE (GLUCOTROL XL) 10 MG 24 hr tablet Take 1 tablet (10 mg total) by mouth 2 (two) times daily. 180 tablet 3  . glucose blood test strip One touch ultra test strips. Use to check blood sugar two times a  day. Dx. E11.9 200 each 11  . loratadine (CLARITIN) 10 MG tablet Take 1 tablet (10 mg total) by mouth daily. 90 tablet 1  . nitroGLYCERIN (NITROSTAT) 0.4 MG SL tablet Place 1 tablet (0.4 mg total) under the tongue every 5 (five) minutes as needed. 60 tablet 0  . omeprazole (PRILOSEC) 20 MG capsule Take 1 capsule (20 mg total) by mouth 2 (two) times daily before a meal. 180 capsule 3  . potassium chloride SA (K-DUR,KLOR-CON) 20 MEQ tablet Take 1 tablet (20 mEq total) by mouth daily. 90 tablet 3  . pravastatin (PRAVACHOL) 40 MG tablet Take 1 tablet (40 mg total) by mouth daily. 90 tablet 3  . sitaGLIPtin (JANUVIA) 50 MG tablet Take 1 tablet (50 mg total) by mouth daily. 90 tablet 3  . torsemide (DEMADEX) 10 MG tablet Take 1 tablet (10 mg total) by mouth 2 (two) times daily as needed. 180 tablet 3   No current facility-administered medications for this visit.     OBJECTIVE: Vitals:   02/24/16 1109  BP: (!) 184/67  Pulse: 94  Resp: 18  Temp: 97.3 F (36.3 C)     Body mass index is 35.52 kg/m.    ECOG FS:0 - Asymptomatic  General: Well-developed, well-nourished, no acute distress. Eyes: Pink conjunctiva, anicteric sclera. Lungs: Clear to auscultation bilaterally. Heart: Regular rate and rhythm. No rubs, murmurs, or gallops. Abdomen: Soft, nontender, nondistended. No organomegaly noted, normoactive bowel sounds. Musculoskeletal: No edema, cyanosis, or clubbing. Neuro: Alert, answering all questions appropriately. Cranial nerves grossly intact. Skin: No rashes or petechiae noted. Psych: Normal affect.   LAB RESULTS:  Lab Results  Component Value Date   NA 138 11/05/2015   K 4.5 11/05/2015   CL 101 11/05/2015   CO2 29 11/05/2015   GLUCOSE 163 (H) 11/05/2015   BUN 27 (H) 11/05/2015   CREATININE 1.42 11/05/2015   CALCIUM 9.4 11/05/2015   PROT 6.7 11/05/2015   ALBUMIN 4.3 11/05/2015   AST 15 11/05/2015   ALT 16 11/05/2015   ALKPHOS 65 11/05/2015   BILITOT 0.7 11/05/2015    GFRNONAA >60 08/09/2012   GFRAA >60 08/09/2012    Lab Results  Component Value Date   WBC 6.4 02/24/2016   NEUTROABS 4.5 02/24/2016   HGB 12.1 (L) 02/24/2016   HCT 35.7 (L) 02/24/2016   MCV 86.9 02/24/2016   PLT 102 (L) 02/24/2016     STUDIES: No results found.  ASSESSMENT:  Thrombocytopenia.  PLAN:    1.  Thrombocytopenia: Patient's most recent platelet count of 102 continues to be decreased, but essentially unchanged from previous. Patient noted to have positive platelet antibody on repeat testing in January 2017. These can be  transient nature and will consider repeating test in the future. The remainder of his laboratory work was either negative or within normal limits. No intervention is needed at this time.  Patient does not require bone marrow biopsy. Return to clinic in 6 months with repeat laboratory work and further evaluation.   2. Anemia: Mild, monitor. 3. Hypertension: Patient's blood pressure is significantly elevated today. Continue evaluation and treatment per primary care.  Patient expressed understanding and was in agreement with this plan. He also understands that He can call clinic at any time with any questions, concerns, or complaints.   Lloyd Huger, MD   02/24/2016 11:48 AM

## 2016-02-24 ENCOUNTER — Inpatient Hospital Stay: Payer: PPO | Attending: Oncology | Admitting: Oncology

## 2016-02-24 ENCOUNTER — Inpatient Hospital Stay: Payer: PPO

## 2016-02-24 VITALS — BP 184/67 | HR 94 | Temp 97.3°F | Resp 18 | Wt 194.2 lb

## 2016-02-24 DIAGNOSIS — I509 Heart failure, unspecified: Secondary | ICD-10-CM

## 2016-02-24 DIAGNOSIS — I110 Hypertensive heart disease with heart failure: Secondary | ICD-10-CM | POA: Diagnosis not present

## 2016-02-24 DIAGNOSIS — Z87891 Personal history of nicotine dependence: Secondary | ICD-10-CM | POA: Diagnosis not present

## 2016-02-24 DIAGNOSIS — D649 Anemia, unspecified: Secondary | ICD-10-CM | POA: Insufficient documentation

## 2016-02-24 DIAGNOSIS — Z7982 Long term (current) use of aspirin: Secondary | ICD-10-CM | POA: Insufficient documentation

## 2016-02-24 DIAGNOSIS — I2510 Atherosclerotic heart disease of native coronary artery without angina pectoris: Secondary | ICD-10-CM | POA: Diagnosis not present

## 2016-02-24 DIAGNOSIS — Z8546 Personal history of malignant neoplasm of prostate: Secondary | ICD-10-CM | POA: Insufficient documentation

## 2016-02-24 DIAGNOSIS — E7800 Pure hypercholesterolemia, unspecified: Secondary | ICD-10-CM | POA: Diagnosis not present

## 2016-02-24 DIAGNOSIS — K859 Acute pancreatitis without necrosis or infection, unspecified: Secondary | ICD-10-CM | POA: Insufficient documentation

## 2016-02-24 DIAGNOSIS — D696 Thrombocytopenia, unspecified: Secondary | ICD-10-CM | POA: Insufficient documentation

## 2016-02-24 DIAGNOSIS — K219 Gastro-esophageal reflux disease without esophagitis: Secondary | ICD-10-CM | POA: Diagnosis not present

## 2016-02-24 DIAGNOSIS — Z79899 Other long term (current) drug therapy: Secondary | ICD-10-CM | POA: Insufficient documentation

## 2016-02-24 DIAGNOSIS — M1990 Unspecified osteoarthritis, unspecified site: Secondary | ICD-10-CM | POA: Insufficient documentation

## 2016-02-24 DIAGNOSIS — J449 Chronic obstructive pulmonary disease, unspecified: Secondary | ICD-10-CM | POA: Diagnosis not present

## 2016-02-24 DIAGNOSIS — Z86010 Personal history of colonic polyps: Secondary | ICD-10-CM | POA: Insufficient documentation

## 2016-02-24 LAB — CBC WITH DIFFERENTIAL/PLATELET
BASOS PCT: 1 %
Basophils Absolute: 0 10*3/uL (ref 0–0.1)
EOS ABS: 0.1 10*3/uL (ref 0–0.7)
EOS PCT: 1 %
HCT: 35.7 % — ABNORMAL LOW (ref 40.0–52.0)
Hemoglobin: 12.1 g/dL — ABNORMAL LOW (ref 13.0–18.0)
LYMPHS ABS: 1.2 10*3/uL (ref 1.0–3.6)
Lymphocytes Relative: 19 %
MCH: 29.5 pg (ref 26.0–34.0)
MCHC: 34 g/dL (ref 32.0–36.0)
MCV: 86.9 fL (ref 80.0–100.0)
MONOS PCT: 9 %
Monocytes Absolute: 0.5 10*3/uL (ref 0.2–1.0)
NEUTROS PCT: 70 %
Neutro Abs: 4.5 10*3/uL (ref 1.4–6.5)
PLATELETS: 102 10*3/uL — AB (ref 150–440)
RBC: 4.1 MIL/uL — ABNORMAL LOW (ref 4.40–5.90)
RDW: 14.7 % — ABNORMAL HIGH (ref 11.5–14.5)
WBC: 6.4 10*3/uL (ref 3.8–10.6)

## 2016-02-24 NOTE — Progress Notes (Signed)
Patient does not offer any problems today.  

## 2016-03-22 ENCOUNTER — Ambulatory Visit (INDEPENDENT_AMBULATORY_CARE_PROVIDER_SITE_OTHER): Payer: PPO | Admitting: Cardiovascular Disease

## 2016-03-22 ENCOUNTER — Encounter: Payer: Self-pay | Admitting: Cardiovascular Disease

## 2016-03-22 VITALS — BP 120/60 | HR 88 | Ht 62.0 in | Wt 194.5 lb

## 2016-03-22 DIAGNOSIS — E782 Mixed hyperlipidemia: Secondary | ICD-10-CM | POA: Diagnosis not present

## 2016-03-22 DIAGNOSIS — E1165 Type 2 diabetes mellitus with hyperglycemia: Secondary | ICD-10-CM

## 2016-03-22 DIAGNOSIS — I2510 Atherosclerotic heart disease of native coronary artery without angina pectoris: Secondary | ICD-10-CM

## 2016-03-22 DIAGNOSIS — E1159 Type 2 diabetes mellitus with other circulatory complications: Secondary | ICD-10-CM | POA: Diagnosis not present

## 2016-03-22 DIAGNOSIS — IMO0002 Reserved for concepts with insufficient information to code with codable children: Secondary | ICD-10-CM

## 2016-03-22 DIAGNOSIS — I10 Essential (primary) hypertension: Secondary | ICD-10-CM

## 2016-03-22 DIAGNOSIS — I272 Pulmonary hypertension, unspecified: Secondary | ICD-10-CM | POA: Diagnosis not present

## 2016-03-22 DIAGNOSIS — J449 Chronic obstructive pulmonary disease, unspecified: Secondary | ICD-10-CM

## 2016-03-22 DIAGNOSIS — I5032 Chronic diastolic (congestive) heart failure: Secondary | ICD-10-CM

## 2016-03-22 NOTE — Progress Notes (Signed)
Cardiology Office Note  Date:  03/22/2016   ID:  Matthew Miles., DOB 1928/05/25, MRN [id number]  PCP:  [name], MD   Chief Complaint  Patient presents with  . other    6 month f/u. Meds reviewed verbally with pt.    HPI:  Matthew Miles is a 82 yo-old gentleman with history of coronary artery disease (occluded LAD), moderate pulmonary hypertension in early 2013, long history of smoking for 60 years, history of bronchitis requiring several courses of antibiotics at the end of 2012, prednisone and nebulizers with admission to the hospital in December for hematemesis and gastric ulcer seen on EGD, found to have COPD and oxygenation down to 83% with ambulation, nonsustained VT per the notes, acute renal failure who presents for followup Of his coronary artery disease Ejection fraction 55% in 2015  In follow-up today he reports that he feels well  Weight still high,   Lab work reviewed with him HBA1C 7.5, down from 8.2 Total chol 130s, LDL 80  No exercise,sedentary No angina sx, stable mild SOB  ("it is fair"), stopped smoking 5 years ago Leg cramps at night  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 Taking  torsemide  20 mg in the morning, 10 mg in the evening Has chronic mild leg edema, nonpitting  EKG on today's visit shows normal sinus rhythm with rate 88 bpm, left anterior fascicular block, old anterior MI  Other past medical history Chronic low back pain Reports that he had a bleeding ulcer 4 years ago at that time stop smoking Last echocardiogram in 2015 showing normal ejection fraction, right heart pressures 37 mmHg  left and right heart cath done in February 2013 showing pulmonary hypertension, stable severe coronary artery disease  Normal ejection fraction  Right heart catheterization showed significant fluid overload with wedge pressure of 24, moderate pulmonary hypertension. Catheterization showed occluded LAD which was old, no other significant stenoses  requiring intervention.   PMH:   has a past medical history of Allergy; Arthritis; CHF (congestive heart failure) ([place]); Chicken pox; Cholecystitis; Colon polyps; COPD (chronic obstructive pulmonary disease) ([place]); Coronary artery disease; Emphysema of lung ([place]); GERD (gastroesophageal reflux disease); Heart murmur; Hematemesis/vomiting blood (04/10/11); Hypercholesterolemia; Mild hypertension; Pancreatitis; Prostate cancer ([place]); Prostate cancer ([place]); Smoker; Ulcer ([place]); and Upper GI bleed (1980).  PSH:    Past Surgical History:  Procedure Laterality Date  . APPENDECTOMY    . CARDIAC CATHETERIZATION  May 2002 and Feb 2013   [facility]; no stents   . CIRCUMCISION    . SP CHOLECYSTOMY    . STOMACH SURGERY     bleeding ulcers, followed by Dr. [name]    Current Outpatient Prescriptions  Medication Sig Dispense Refill  . aspirin 81 MG tablet Take 81 mg by mouth daily.    . Blood Glucose Monitoring Suppl W/DEVICE KIT Accu-chek Aviva Plus. Use as directed to check blood sugar two times a day. Dx. E11.65 1 each 0  . carvedilol (COREG) 3.125 MG tablet Take 1 tablet (3.125 mg total) by mouth 2 (two) times daily. 180 tablet 3  . Cyanocobalamin (B-12) 100 MCG TABS Take by mouth daily.    . cyclobenzaprine (FLEXERIL) 5 MG tablet Take 1 tablet (5 mg total) by mouth 3 (three) times daily as needed for muscle spasms. 30 tablet 1  . glipiZIDE (GLUCOTROL XL) 10 MG 24 hr tablet Take 1 tablet (10 mg total) by mouth 2 (two) times daily. 180 tablet 3  . glucose blood test strip One touch  ultra test strips. Use to check blood sugar two times a day. Dx. E11.9 200 each 11  . loratadine (CLARITIN) 10 MG tablet Take 1 tablet (10 mg total) by mouth daily. 90 tablet 1  . nitroGLYCERIN (NITROSTAT) 0.4 MG SL tablet Place 1 tablet (0.4 mg total) under the tongue every 5 (five) minutes as needed. 60 tablet 0  . omeprazole (PRILOSEC) 20 MG capsule Take 1 capsule (20 mg total) by mouth 2 (two) times daily before a meal. 180  capsule 3  . potassium chloride SA (K-DUR,KLOR-CON) 20 MEQ tablet Take 1 tablet (20 mEq total) by mouth daily. 90 tablet 3  . pravastatin (PRAVACHOL) 40 MG tablet Take 1 tablet (40 mg total) by mouth daily. 90 tablet 3  . sitaGLIPtin (JANUVIA) 50 MG tablet Take 1 tablet (50 mg total) by mouth daily. 90 tablet 3  . torsemide (DEMADEX) 10 MG tablet Take 1 tablet (10 mg total) by mouth 2 (two) times daily as needed. 180 tablet 3   No current facility-administered medications for this visit.      Allergies:   Sulfa antibiotics and Tradjenta [linagliptin]   Social History:  The patient  reports that he quit smoking about 4 years ago. His smoking use included Cigarettes. He has a 65.00 pack-year smoking history. He has quit using smokeless tobacco. His smokeless tobacco use included Chew. He reports that he does not drink alcohol or use drugs.   Family History:   family history includes Emphysema in his sister; Liver cancer in his brother; Prostate cancer in his brother and father.    Review of Systems: Review of Systems  Constitutional: Negative.   Respiratory: Negative.   Cardiovascular: Negative.   Gastrointestinal: Negative.   Musculoskeletal: Positive for joint pain.  Neurological: Negative.   Psychiatric/Behavioral: Negative.   All other systems reviewed and are negative.    PHYSICAL EXAM: VS:  BP 120/60 (BP Location: Left Arm, Patient Position: Sitting, Cuff Size: Normal)   Pulse 88   Ht _0  (1.575 m)   Wt 194 lb 8 oz (88.2 kg)   BMI 35.57 kg/m  , BMI Body mass index is 35.57 kg/m. GEN: Well nourished, well developed, in no acute distress , obese HEENT: normal  Neck: no JVD, carotid bruits, or masses Cardiac: RRR; no murmurs, rubs, or gallops,trace nonpitting edema b/l  Respiratory:  clear to auscultation bilaterally, normal work of breathing GI: soft, nontender, nondistended, + BS MS: no deformity or atrophy  Skin: warm and dry, no rash Neuro:  Strength and sensation  are intact Psych: euthymic mood, full affect    Recent Labs: 11/05/2015: ALT 16; BUN 27; Creatinine, Ser 1.42; Potassium 4.5; Sodium 138 02/24/2016: Hemoglobin 12.1; Platelets 102    Lipid Panel Lab Results  Component Value Date   CHOL 135 08/05/2015   HDL 35.10 (L) 08/05/2015   LDLCALC 68 01/22/2015   TRIG 310.0 (H) 08/05/2015      Wt Readings from Last 3 Encounters:  03/22/16 194 lb 8 oz (88.2 kg)  02/24/16 194 lb 3.6 oz (88.1 kg)  02/03/16 192 lb 9.6 oz (87.4 kg)       ASSESSMENT AND PLAN:  Pulmonary hypertension ([place]) - Plan: EKG 12-Lead Last echo 10/2013 Takes torsemide  Chronic diastolic CHF (congestive heart failure) ([facility]) - Plan: EKG 12-Lead Euvolemic, torsemide 20 mg daily in the am and 10 mg at night Potassium stable  Essential hypertension - Plan: EKG 12-Lead Blood pressure is well controlled on today's visit. No changes made  to the medications.  Coronary artery disease involving native coronary artery of native heart without angina pectoris - Plan: EKG 12-Lead Currently with no symptoms of angina. No further workup at this time. Continue current medication regimen.  Mixed hyperlipidemia - Plan: EKG 12-Lead Cholesterol is at goal on the current lipid regimen. No changes to the medications were made.  Uncontrolled type 2 diabetes mellitus with other circulatory complication, without long-term current use of insulin ([place]) - Plan: EKG 12-Lead Improved, not at goal yet, Diet discussed.  Chronic obstructive pulmonary disease, unspecified COPD type ([place]) - Plan: EKG 12-Lead Stable , no bronchitis   Total encounter time more than 25 minutes  Greater than 50% was spent in counseling and coordination of care with the patient   Disposition:   F/U  6 months   Orders Placed This Encounter  Procedures  . EKG 12-Lead     Signed, Esmond Plants, M.D., Ph.D. 03/22/2016  [place], [place]

## 2016-03-22 NOTE — Patient Instructions (Signed)

## 2016-03-29 ENCOUNTER — Other Ambulatory Visit: Payer: Self-pay

## 2016-03-29 NOTE — Telephone Encounter (Signed)
Lat filled 04/30/2015 last lab checked 11/05/15 with normal potassium result

## 2016-03-30 MED ORDER — POTASSIUM CHLORIDE CRYS ER 20 MEQ PO TBCR: 20.0000 meq | EXTENDED_RELEASE_TABLET | Freq: Every day | ORAL | 3 refills | Status: DC

## 2016-03-30 NOTE — Telephone Encounter (Signed)
Sent to pharmacy 

## 2016-05-06 ENCOUNTER — Ambulatory Visit (INDEPENDENT_AMBULATORY_CARE_PROVIDER_SITE_OTHER): Payer: PPO | Admitting: Family Medicine

## 2016-05-06 ENCOUNTER — Encounter: Payer: Self-pay | Admitting: Family Medicine

## 2016-05-06 VITALS — BP 144/64 | HR 88 | Temp 97.8°F | Wt 192.8 lb

## 2016-05-06 DIAGNOSIS — I10 Essential (primary) hypertension: Secondary | ICD-10-CM

## 2016-05-06 DIAGNOSIS — J449 Chronic obstructive pulmonary disease, unspecified: Secondary | ICD-10-CM

## 2016-05-06 DIAGNOSIS — R9721 Rising PSA following treatment for malignant neoplasm of prostate: Secondary | ICD-10-CM | POA: Diagnosis not present

## 2016-05-06 DIAGNOSIS — IMO0002 Reserved for concepts with insufficient information to code with codable children: Secondary | ICD-10-CM

## 2016-05-06 DIAGNOSIS — D649 Anemia, unspecified: Secondary | ICD-10-CM

## 2016-05-06 DIAGNOSIS — E1159 Type 2 diabetes mellitus with other circulatory complications: Secondary | ICD-10-CM

## 2016-05-06 DIAGNOSIS — Z8546 Personal history of malignant neoplasm of prostate: Secondary | ICD-10-CM

## 2016-05-06 DIAGNOSIS — D509 Iron deficiency anemia, unspecified: Secondary | ICD-10-CM

## 2016-05-06 DIAGNOSIS — E119 Type 2 diabetes mellitus without complications: Secondary | ICD-10-CM

## 2016-05-06 DIAGNOSIS — E1165 Type 2 diabetes mellitus with hyperglycemia: Secondary | ICD-10-CM

## 2016-05-06 DIAGNOSIS — H1013 Acute atopic conjunctivitis, bilateral: Secondary | ICD-10-CM

## 2016-05-06 LAB — COMPREHENSIVE METABOLIC PANEL
ALT: 14 U/L (ref 0–53)
AST: 13 U/L (ref 0–37)
Albumin: 4.1 g/dL (ref 3.5–5.2)
Alkaline Phosphatase: 70 U/L (ref 39–117)
BILIRUBIN TOTAL: 0.5 mg/dL (ref 0.2–1.2)
BUN: 26 mg/dL — ABNORMAL HIGH (ref 6–23)
CALCIUM: 9.3 mg/dL (ref 8.4–10.5)
CHLORIDE: 102 meq/L (ref 96–112)
CO2: 28 meq/L (ref 19–32)
Creatinine, Ser: 1.29 mg/dL (ref 0.40–1.50)
GFR: 56.21 mL/min — AB (ref >60.00)
Glucose, Bld: 264 mg/dL — ABNORMAL HIGH (ref 70–99)
Potassium: 4.5 mEq/L (ref 3.5–5.1)
Sodium: 138 mEq/L (ref 135–145)
Total Protein: 6.5 g/dL (ref 6.0–8.3)

## 2016-05-06 LAB — CBC
HCT: 35.7 % — ABNORMAL LOW (ref 39.0–52.0)
HEMOGLOBIN: 12 g/dL — AB (ref 13.0–17.0)
MCHC: 33.7 g/dL (ref 30.0–36.0)
MCV: 86.2 fl (ref 78.0–100.0)
PLATELETS: 123 10*3/uL — AB (ref 150.0–400.0)
RBC: 4.15 Mil/uL — ABNORMAL LOW (ref 4.22–5.81)
RDW: 14.5 % (ref 11.5–15.5)
WBC: 6.7 10*3/uL (ref 4.0–10.5)

## 2016-05-06 LAB — HEMOGLOBIN A1C: Hgb A1c MFr Bld: 9 % — ABNORMAL HIGH (ref 4.6–6.5)

## 2016-05-06 NOTE — Assessment & Plan Note (Signed)
Has not responded to prescription eyedrops or Claritin. Patient would prefer to not treat this any further and continue to monitor.

## 2016-05-06 NOTE — Patient Instructions (Signed)
Nice to see you. We will check some lab work today. We are also going to refer you to a urologist given that her last PSA was slightly higher than previously. Once your lab work comes back we will give you a call.

## 2016-05-06 NOTE — Progress Notes (Signed)
Matthew Rumps, MD Phone: [phone]  Matthew Miles. is a 83 y.o. male who presents today for follow-up.  HYPERTENSION  Disease Monitoring  Home BP Monitoring not checking Chest pain- no     Medications  Compliance-  takingcoreg.  Edema- minimal at the end of the day, goes down overnight. No orthopnea or PND. Notes when physically active he does have minimal shortness of breath though this is what he would expect given the activity that he is doing. No significant shortness of breath.  DIABETES Disease Monitoring: Blood Sugar ranges-greater than 200 most of the time Polyuria/phagia/dipsia- some polydipsia      Visual problems- no Medications: Compliance- taking Januvia, glipizide Hypoglycemic symptoms- no Does note his blood sugar was 224 on one meter this morning and 284 sometime on another meter. This was after eating a salad with iceberg lettuce, oil, vinegar, cheese, and tuna last night. Did have some peanuts and boiled eggs as well  COPD: Patient notes no cough. No significant shortness of breath. Rare congestion. Not on any inhalers for this. Feels well overall regarding this.  Allergic conjunctivitis: Patient notes his eyes do water at times. Has seen ophthalmology for this and they tried a prescription eyedrop that was not beneficial. He tried Claritin as well though it has not been beneficial either. He wants to just watch this.  History of prostate cancer: Most recent PSA was slightly elevated compared to prior though still in the normal range. A referral to urology was placed at that time though does not appear that he was referred back to them.  PMH: Former smoker   ROS see history of present illness  Objective  Physical Exam Vitals:   05/06/16 1330  BP: (!) 144/64  Pulse: 88  Temp: 97.8 F (36.6 C)    BP Readings from Last 3 Encounters:  05/06/16 (!) 144/64  03/22/16 120/60  02/24/16 (!) 184/67   Wt Readings from Last 3 Encounters:  05/06/16 192 lb  12.8 oz (87.5 kg)  03/22/16 194 lb 8 oz (88.2 kg)  02/24/16 194 lb 3.6 oz (88.1 kg)    Physical Exam  Constitutional: No distress.  HENT:  Head: Normocephalic and atraumatic.  Eyes: Conjunctivae are normal. Pupils are equal, round, and reactive to light.  Eyes watering  Cardiovascular: Normal rate, regular rhythm and normal heart sounds.   Pulmonary/Chest: Effort normal and breath sounds normal.  Musculoskeletal: He exhibits no edema.  Neurological: He is alert. Gait normal.  Skin: He is not diaphoretic.     Assessment/Plan: Please see individual problem list.  Essential hypertension At goal for age. Diastolic at the lower end of normal would limit any medication changes.  COPD (chronic obstructive pulmonary disease) ([place]) Asymptomatic currently. Continue to monitor.  Diabetes mellitus type 2, uncontrolled ([place]) Poorly controlled per CBGs. We will check an A1c today and likely add a medication to his regimen.  H/O malignant neoplasm of prostate Refer back to urology given most recent PSA slightly higher than previously. Spoke with referral coordinator regarding this.  Iron deficiency anemia Recheck CBC given most recent hemoglobin was slightly low and patient never returned for recheck.  Allergic conjunctivitis Has not responded to prescription eyedrops or Claritin. Patient would prefer to not treat this any further and continue to monitor.   Orders Placed This Encounter  Procedures  . CBC  . Comp Met (CMET)  . HgB A1c  . Ambulatory referral to Urology    Referral Priority:   Routine    Referral  Type:   Consultation    Referral Reason:   Specialty Services Required    Requested Specialty:   Urology    Number of Visits Requested:   Adrian, MD [place]

## 2016-05-06 NOTE — Assessment & Plan Note (Signed)
Poorly controlled per CBGs. We will check an A1c today and likely add a medication to his regimen.

## 2016-05-06 NOTE — Assessment & Plan Note (Signed)
Recheck CBC given most recent hemoglobin was slightly low and patient never returned for recheck.

## 2016-05-06 NOTE — Progress Notes (Signed)
Pre visit review using our clinic review tool, if applicable. No additional management support is needed unless otherwise documented below in the visit note. 

## 2016-05-06 NOTE — Assessment & Plan Note (Addendum)
Refer back to urology given most recent PSA slightly higher than previously. Spoke with referral coordinator regarding this.

## 2016-05-06 NOTE — Assessment & Plan Note (Signed)
At goal for age. Diastolic at the lower end of normal would limit any medication changes.

## 2016-05-06 NOTE — Assessment & Plan Note (Signed)
Asymptomatic currently. Continue to monitor.

## 2016-05-09 ENCOUNTER — Telehealth: Payer: Self-pay | Admitting: *Deleted

## 2016-05-09 ENCOUNTER — Other Ambulatory Visit: Payer: Self-pay | Admitting: Family Medicine

## 2016-05-09 MED ORDER — METFORMIN HCL 500 MG PO TABS: 500.0000 mg | ORAL_TABLET | Freq: Every day | ORAL | 3 refills | Status: DC

## 2016-05-09 NOTE — Telephone Encounter (Signed)
Patient daughter notified of results.

## 2016-05-09 NOTE — Telephone Encounter (Signed)
Pt's daughter requested lab results -Daughter also has questions about the blood sugar test meter.  Contact [name] [phone]

## 2016-05-11 ENCOUNTER — Telehealth: Payer: Self-pay | Admitting: Family Medicine

## 2016-05-11 NOTE — Telephone Encounter (Signed)
Pt daughter called and is requesting One touch berio flex test strips. Please advise, thank you!  Pharmacy - [facility] [place], [place] - [address]  Call pt @ [phone]

## 2016-05-12 ENCOUNTER — Other Ambulatory Visit: Payer: Self-pay

## 2016-05-12 MED ORDER — GLUCOSE BLOOD VI STRP: ORAL_STRIP | 11 refills | Status: DC

## 2016-05-12 NOTE — Telephone Encounter (Signed)
Patient notified

## 2016-06-06 ENCOUNTER — Ambulatory Visit: Payer: PPO | Admitting: Urology

## 2016-06-06 ENCOUNTER — Encounter: Payer: Self-pay | Admitting: Urology

## 2016-06-06 VITALS — BP 148/62 | HR 101 | Ht 62.0 in | Wt 194.3 lb

## 2016-06-06 DIAGNOSIS — Z8546 Personal history of malignant neoplasm of prostate: Secondary | ICD-10-CM | POA: Diagnosis not present

## 2016-06-06 DIAGNOSIS — R9721 Rising PSA following treatment for malignant neoplasm of prostate: Secondary | ICD-10-CM

## 2016-06-06 NOTE — Progress Notes (Signed)
06/06/2016 2:48 PM   Matthew Miles. [birth year] [id number]  Referring provider: [name], MD [address] [place], [place] [address]  Chief Complaint  Patient presents with  . New Patient (Initial Visit)    history prostate cancer     HPI: Pt with a history of prostate cancer and rising PSA. He had radiation done in "early 1990s or 2000s)". He saw Dr. [name]. His PSA was 0.93 Apr 2014 and had risen to 2.14 Jan 2016 -- PSADT = 2.4 years. An Apr 2014 CT A/P was done which showed no mets, gold seeds in prostate.  UA clear today. No bothersome LUTS. He voids with a good stream. Some frequency after diuretics. No gross hematuria.    PMH: Past Medical History:  Diagnosis Date  . Allergy   . Arthritis   . CHF (congestive heart failure) ([place])   . Chicken pox   . Cholecystitis   . Colon polyps   . COPD (chronic obstructive pulmonary disease) ([place])   . Coronary artery disease   . Emphysema of lung ([place])   . GERD (gastroesophageal reflux disease)   . Heart murmur   . Hematemesis/vomiting blood 04/10/11  . Hypercholesterolemia   . Mild hypertension   . Pancreatitis   . Prostate cancer ([place])   . Prostate cancer [facility])    radiation therapy   . Smoker   . Ulcer ([place])   . Upper GI bleed 1980    Surgical History: Past Surgical History:  Procedure Laterality Date  . APPENDECTOMY    . CARDIAC CATHETERIZATION  May 2002 and Feb 2013   [facility]; no stents   . CATARACT EXTRACTION    . CHOLECYSTECTOMY    . CIRCUMCISION    . COLONOSCOPY    . HEMORRHOID SURGERY    . SP CHOLECYSTOMY    . STOMACH SURGERY     bleeding ulcers, followed by Dr. [name]    Home Medications:  Allergies as of 06/06/2016      Reactions   Sulfa Antibiotics    GI upset   Tradjenta [linagliptin]    Hair loss      Medication List       Accurate as of 06/06/16  2:48 PM. Always use your most recent med list.          aspirin 81 MG tablet Take 81 mg by mouth daily.   B-12 100 MCG  Tabs Take by mouth daily.   Blood Glucose Monitoring Suppl w/Device Kit Accu-chek Aviva Plus. Use as directed to check blood sugar two times a day. Dx. E11.65   carvedilol 3.125 MG tablet Commonly known as:  COREG Take 1 tablet (3.125 mg total) by mouth 2 (two) times daily.   cyclobenzaprine 5 MG tablet Commonly known as:  FLEXERIL Take 1 tablet (5 mg total) by mouth 3 (three) times daily as needed for muscle spasms.   glipiZIDE 10 MG 24 hr tablet Commonly known as:  GLUCOTROL XL Take 1 tablet (10 mg total) by mouth 2 (two) times daily.   glucose blood test strip One touch  berio  test strips. Use to check blood sugar two times a day. Dx. E11.9   loratadine 10 MG tablet Commonly known as:  CLARITIN Take 1 tablet (10 mg total) by mouth daily.   metFORMIN 500 MG tablet Commonly known as:  GLUCOPHAGE Take 1 tablet (500 mg total) by mouth daily with breakfast.   nitroGLYCERIN 0.4 MG SL tablet Commonly known as:  NITROSTAT  Place 1 tablet (0.4 mg total) under the tongue every 5 (five) minutes as needed.   omeprazole 20 MG capsule Commonly known as:  PRILOSEC Take 1 capsule (20 mg total) by mouth 2 (two) times daily before a meal.   potassium chloride SA 20 MEQ tablet Commonly known as:  K-DUR,KLOR-CON Take 1 tablet (20 mEq total) by mouth daily.   pravastatin 40 MG tablet Commonly known as:  PRAVACHOL Take 1 tablet (40 mg total) by mouth daily.   sitaGLIPtin 50 MG tablet Commonly known as:  JANUVIA Take 1 tablet (50 mg total) by mouth daily.   torsemide 10 MG tablet Commonly known as:  DEMADEX Take 1 tablet (10 mg total) by mouth 2 (two) times daily as needed.       Allergies:  Allergies  Allergen Reactions  . Sulfa Antibiotics     GI upset  . Tradjenta [Linagliptin]     Hair loss    Family History: Family History  Problem Relation Age of Onset  . Prostate cancer Father   . Liver cancer Brother   . Prostate cancer Brother   . Emphysema Sister      smoker    Social History:  reports that he quit smoking about 5 years ago. His smoking use included Cigarettes. He has a 65.00 pack-year smoking history. He has quit using smokeless tobacco. His smokeless tobacco use included Chew. He reports that he does not drink alcohol or use drugs.  ROS: UROLOGY Frequent Urination?: Yes Hard to postpone urination?: No Burning/pain with urination?: No Get up at night to urinate?: Yes Leakage of urine?: No Urine stream starts and stops?: No Trouble starting stream?: No Do you have to strain to urinate?: No Blood in urine?: No Urinary tract infection?: No Sexually transmitted disease?: No Injury to kidneys or bladder?: No Painful intercourse?: No Weak stream?: No Erection problems?: No Penile pain?: No  Gastrointestinal Nausea?: No Vomiting?: No Indigestion/heartburn?: Yes Diarrhea?: No Constipation?: No  Constitutional Fever: No Night sweats?: No Weight loss?: No Fatigue?: No  Skin Skin rash/lesions?: No Itching?: No  Eyes Blurred vision?: No Double vision?: No  Ears/Nose/Throat Sore throat?: No Sinus problems?: No  Hematologic/Lymphatic Swollen glands?: No Easy bruising?: Yes  Cardiovascular Leg swelling?: Yes Chest pain?: No  Respiratory Cough?: No Shortness of breath?: Yes  Endocrine Excessive thirst?: No  Musculoskeletal Back pain?: No Joint pain?: Yes  Neurological Headaches?: No Dizziness?: No  Psychologic Depression?: No Anxiety?: No  Physical Exam: BP (!) 148/62   Pulse (!) 101   Ht 5' 2"  (1.575 m)   Wt 88.1 kg (194 lb 4.8 oz)   BMI 35.54 kg/m   Constitutional:  Alert and oriented, No acute distress. HEENT: [place] AT, moist mucus membranes.  Trachea midline, no masses. Cardiovascular: No clubbing, cyanosis, or edema. Respiratory: Normal respiratory effort, no increased work of breathing. GI: Abdomen is soft, nontender, nondistended, no abdominal masses GU: No CVA tenderness. Skin: No  rashes, bruises or suspicious lesions. Lymph: No cervical or inguinal adenopathy. Neurologic: Grossly intact, no focal deficits, moving all 4 extremities. Psychiatric: Normal mood and affect.  Laboratory Data: Lab Results  Component Value Date   WBC 6.7 05/06/2016   HGB 12.0 (L) 05/06/2016   HCT 35.7 (L) 05/06/2016   MCV 86.2 05/06/2016   PLT 123.0 (L) 05/06/2016    Lab Results  Component Value Date   CREATININE 1.29 05/06/2016    Lab Results  Component Value Date   PSA 2.50 02/04/2016   PSA 0.93  07/11/2012    No results found for: TESTOSTERONE  Lab Results  Component Value Date   HGBA1C 9.0 (H) 05/06/2016    Urinalysis    Component Value Date/Time   COLORURINE Yellow 08/09/2012 1900   APPEARANCEUR Hazy 08/09/2012 1900   LABSPEC 1.017 08/09/2012 1900   PHURINE 5.0 08/09/2012 1900   GLUCOSEU Negative 08/09/2012 1900   HGBUR Negative 08/09/2012 1900   BILIRUBINUR neg 10/08/2013 1153   BILIRUBINUR Negative 08/09/2012 1900   KETONESUR Negative 08/09/2012 1900   PROTEINUR neg 10/08/2013 1153   PROTEINUR 30 mg/dL 08/09/2012 1900   UROBILINOGEN 0.2 10/08/2013 1153   NITRITE neg 10/08/2013 1153   NITRITE Negative 08/09/2012 1900   LEUKOCYTESUR small (1+) 10/08/2013 1153   LEUKOCYTESUR 2+ 08/09/2012 1900    Pertinent Imaging: CT A/P   Assessment & Plan:    1. H/O malignant neoplasm of prostate 2. Rising PSA following treatment for malignant neoplasm of prostate - we discussed definition of failure after XRT = 2 + nadir, so he's probably just reaching failure status. We discussed the nature risks and benefits of staging, biopsy, salvage treatments, surveillance, early vs. Late ADT. He wants to continue surveillance of if he ever developed a fast PSAV/DT or mets we would start ADT. He understands prostate cancer can't be "cured" once it spreads from the prostate. I'll send a PSA today and depending on level, recheck in 3 - 6 months.   - Urinalysis, Complete   No  Follow-up on file.  Festus Aloe, [facility] [address], [place], [place] [address] [phone]

## 2016-06-07 LAB — MICROSCOPIC EXAMINATION
BACTERIA UA: NONE SEEN
RBC, UA: NONE SEEN /hpf (ref 0–?)

## 2016-06-07 LAB — URINALYSIS, COMPLETE
Bilirubin, UA: NEGATIVE
Ketones, UA: NEGATIVE
Nitrite, UA: NEGATIVE
PH UA: 5 (ref 5.0–7.5)
Protein, UA: NEGATIVE
RBC UA: NEGATIVE
Specific Gravity, UA: 1.01 (ref 1.005–1.030)
Urobilinogen, Ur: 0.2 mg/dL (ref 0.2–1.0)

## 2016-06-07 LAB — PSA: Prostate Specific Ag, Serum: 2.3 ng/mL (ref 0.0–4.0)

## 2016-06-09 ENCOUNTER — Telehealth: Payer: Self-pay

## 2016-06-09 DIAGNOSIS — Z8546 Personal history of malignant neoplasm of prostate: Secondary | ICD-10-CM

## 2016-06-09 NOTE — Telephone Encounter (Signed)
Festus Aloe, MD  Lestine Box, LPN        Notify patient his PSA is stable, a little lower even. 2.3. He should f/u in 6 months with PSA prior. Thanks.    Spoke with pt in reference to PSA results and f/u visit. Pt voiced understanding. Orders placed.

## 2016-06-22 ENCOUNTER — Telehealth: Payer: Self-pay

## 2016-06-22 ENCOUNTER — Ambulatory Visit (INDEPENDENT_AMBULATORY_CARE_PROVIDER_SITE_OTHER): Payer: PPO

## 2016-06-22 VITALS — BP 136/64 | HR 96 | Temp 97.9°F | Resp 16 | Ht 62.0 in | Wt 196.0 lb

## 2016-06-22 DIAGNOSIS — Z0000 Encounter for general adult medical examination without abnormal findings: Secondary | ICD-10-CM | POA: Diagnosis not present

## 2016-06-22 NOTE — Telephone Encounter (Signed)
Yes this is ok 

## 2016-06-22 NOTE — Patient Instructions (Addendum)
  [name] , Thank you for taking time to come for your Medicare Wellness Visit. I appreciate your ongoing commitment to your health goals. Please review the following plan we discussed and let me know if I can assist you in the future.   Follow up with Dr. [name] as needed.    Bring a copy of your [place] and/or Living Will to be scanned into chart.  Bring glucose monitoring system to nurse visit tomorrow for diabetic teaching.  Have a great day!  These are the goals we discussed: Goals    . Healthy Lifestyle          Stay hydrated!  Drink plenty of water. Low carb foods.  Choose lean meats, fruits and vegetables.    . Increase physical activity          Ride exercise bike 1-2 times weekly, as tolerated.        This is a list of the screening recommended for you and due dates:  Health Maintenance  Topic Date Due  . Complete foot exam   06/09/2016  . Urine Protein Check  08/04/2016  . Eye exam for diabetics  08/25/2016  . Hemoglobin A1C  11/03/2016  . Tetanus Vaccine  04/06/2021  . Flu Shot  Completed  . Pneumonia vaccines  Completed

## 2016-06-22 NOTE — Telephone Encounter (Addendum)
Pt states he received a glucose monitoring system from this office about 3 weeks ago and is no longer working. It reads ERROR.  Advised to check the controls and he states there was no control in the box. Is it ok he be placed on the nurse schedule tomorrow for diabetic teaching so all can be assessed?

## 2016-06-22 NOTE — Progress Notes (Signed)
Subjective:   Matthew Miles. is a 83 y.o. male who presents for Medicare Annual/Subsequent preventive examination.  Review of Systems:  No ROS.  Medicare Wellness Visit. Cardiac Risk Factors include: hypertension;advanced age (>24mn, >>78women);male gender;obesity (BMI >30kg/m2);diabetes mellitus     Objective:    Vitals: BP 136/64 (BP Location: Right Arm, Patient Position: Sitting, Cuff Size: Normal)   Pulse 96   Temp 97.9 F (36.6 C) (Oral)   Resp 16   Ht 5' 2"  (1.575 m)   Wt 196 lb (88.9 kg)   SpO2 95%   BMI 35.85 kg/m   Body mass index is 35.85 kg/m.  Tobacco History  Smoking Status  . Former Smoker  . Packs/day: 1.00  . Years: 65.00  . Types: Cigarettes  . Quit date: 04/10/2011  Smokeless Tobacco  . 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 Types: Chew     Counseling given: Not Answered   Past Medical History:  Diagnosis Date  . Allergy   . Arthritis   . CHF (congestive heart failure) ([place]   . Chicken pox   . Cholecystitis   . Colon polyps   . [place] disease) [place]   . Coronary artery disease   . [place]physema of lung ([place]   . GERD (gastroesophageal reflux disease)   . Heart murmur   . Hematemesis/vomiting blood 04/10/11  . Hypercholesterolemia   . Mild hypertension   . Pancreatitis   . Prostate cancer ([place]   . Prostate cancer ([facility]    radiation therapy   . Smoker   . [place] ([place]   . Upper GI bleed 1980   Past Surgical History:  Procedure Laterality Date  . APPENDECTOMY    . [facility]  [facility]   . CATARACT EXTRACTION    . CHOLECYSTECTOMY    . CIRCUMCISION    . COLONOSCOPY    . HEMORRHOID SURGERY    . SP CHOLECYSTOMY    . STOMACH SURGERY     bleeding ulcers, followed by Dr. ETiffany Kocher  Family History  Problem Relation Age of Onset  . Prostate cancer Father   . Liver cancer Brother   . Prostate cancer Brother   . Emphysema Sister     smoker   History  Sexual Activity  . Sexual  activity: Not Currently    Outpatient Encounter Prescriptions as of 06/22/2016  Medication Sig  . aspirin 81 MG tablet Take 81 mg by mouth daily.  . Blood Glucose Monitoring Suppl W/DEVICE KIT Accu-chek Aviva Plus. Use as directed to check blood sugar two times a day. Dx. E11.65  . carvedilol (COREG) 3.125 MG tablet Take 1 tablet (3.125 mg total) by mouth 2 (two) times daily.  . Cyanocobalamin (B-12) 100 MCG TABS Take by mouth daily.  . cyclobenzaprine (FLEXERIL) 5 MG tablet Take 1 tablet (5 mg total) by mouth 3 (three) times daily as needed for muscle spasms.  .Marland KitchenglipiZIDE (GLUCOTROL XL) 10 MG 24 hr tablet Take 1 tablet (10 mg total) by mouth 2 (two) times daily.  .Marland Kitchenglucose blood test strip One touch  berio  test strips. Use to check blood sugar two times a day. Dx. E11.9  . loratadine (CLARITIN) 10 MG tablet Take 1 tablet (10 mg total) by mouth daily.  . metFORMIN (GLUCOPHAGE) 500 MG tablet Take 1 tablet (500 mg total) by mouth daily with breakfast.  . nitroGLYCERIN (NITROSTAT) 0.4 MG SL tablet Place 1 tablet (0.4  mg total) under the tongue every 5 (five) minutes as needed.  Marland Kitchen omeprazole (PRILOSEC) 20 MG capsule Take 1 capsule (20 mg total) by mouth 2 (two) times daily before a meal.  . potassium chloride SA (K-DUR,KLOR-CON) 20 MEQ tablet Take 1 tablet (20 mEq total) by mouth daily.  . pravastatin (PRAVACHOL) 40 MG tablet Take 1 tablet (40 mg total) by mouth daily.  . sitaGLIPtin (JANUVIA) 50 MG tablet Take 1 tablet (50 mg total) by mouth daily.  Marland Kitchen torsemide (DEMADEX) 10 MG tablet Take 1 tablet (10 mg total) by mouth 2 (two) times daily as needed.   No facility-administered encounter medications on file as of 06/22/2016.     Activities of Daily Living In your present state of health, do you have any difficulty performing the following activities: 06/22/2016  Hearing? Y  Vision? N  Difficulty concentrating or making decisions? N  Walking or climbing stairs? Y  Dressing or bathing? N    Doing errands, shopping? N  Preparing Food and eating ? N  Using the Toilet? N  In the past six months, have you accidently leaked urine? N  Do you have problems with loss of bowel control? N  Managing your Medications? N  Managing your Finances? N  Housekeeping or managing your Housekeeping? N  Some recent data might be hidden    Patient Care Team: [name], MD as PCP - General (Family Medicine) Minna Merritts, MD as Consulting Physician (Cardiology)   Assessment:    This is a routine wellness examination for Reg. The goal of the wellness visit is to assist the patient how to close the gaps in care and create a preventative care plan for the patient.   Osteoporosis risk reviewed.  Medications reviewed; taking without issues or barriers.  Safety issues reviewed; smoke detectors in the home. No firearms in the home.  Wears seatbelts when driving or riding with others. Patient does wear sunscreen or protective clothing when in direct sunlight. No violence in the home.  Patient is alert, normal appearance, oriented to person/place/and time. Correctly identified the president of the [place], recall of 2/3 words, and performing simple calculations.  Patient displays appropriate judgement and can read correct time from watch face.  No new identified risk were noted.  No failures at ADL's or IADL's.   BMI- discussed the importance of a healthy diet, water intake and exercise. Educational material provided.   Diet: Breakfast: 2 eggs, bacon Lunch: Deli  sandwich Dinner: 2 hotdogs Daily fluid intake: 3 cups of caffeine, 3 cups of water  HTN- followed by PCP.  Dental- wears dentures.  Eye- Visual acuity not assessed per patient preference since they have regular follow up with the ophthalmologist.  Wears corrective lenses.  Sleep patterns- Sleeps 6-7 hours at night.  Wakes feeling rested.  Foot exam appointment is to be scheduled with Tift.  Patient  Concerns: None at this time. Follow up with PCP as needed.  Exercise Activities and Dietary recommendations Current Exercise Habits: The patient does not participate in regular exercise at present  Goals    . Healthy Lifestyle          Stay hydrated!  Drink plenty of water. Low carb foods.  Choose lean meats, fruits and vegetables.    . Increase physical activity          Ride exercise bike 1-2 times weekly, as tolerated.       Fall Risk Fall Risk  06/22/2016 06/23/2015 04/21/2014  04/01/2013 03/07/2012  Falls in the past year? No No No No Yes  Number falls in past yr: - - - - 1  Injury with Fall? - - - - Yes   Depression Screen PHQ 2/9 Scores 06/22/2016 06/23/2015 04/21/2014 04/01/2013  PHQ - 2 Score 0 0 0 0    Cognitive Function MMSE - Mini Mental State Exam 06/22/2016 06/23/2015  Orientation to time 5 5  Orientation to Place 5 5  Registration 3 3  Attention/ Calculation 5 5  Recall 3 3  Language- name 2 objects 2 2  Language- repeat 1 1  Language- follow 3 step command 3 3  Language- read & follow direction 1 1  Write a sentence 1 1  Copy design 1 1  Total score 30 30        Immunization History  Administered Date(s) Administered  . Influenza Split 12/29/2011  . Influenza, High Dose Seasonal PF 12/24/2015  . Influenza,inj,Quad PF,36+ Mos 01/10/2013, 01/17/2014, 01/22/2015  . Pneumococcal Conjugate-13 04/21/2014  . Pneumococcal Polysaccharide-23 12/29/2011  . Tdap 04/07/2011   Screening Tests Health Maintenance  Topic Date Due  . FOOT EXAM  06/09/2016  . URINE MICROALBUMIN  08/04/2016  . OPHTHALMOLOGY EXAM  08/25/2016  . HEMOGLOBIN A1C  11/03/2016  . TETANUS/TDAP  04/06/2021  . INFLUENZA VACCINE  Completed  . PNA vac Low Risk Adult  Completed      Plan:    End of life planning; Advance aging; Advanced directives discussed. Copy of current HCPOA/Living Will requested.    Medicare Attestation I have personally reviewed: The patient's medical and  social history Their use of alcohol, tobacco or illicit drugs Their current medications and supplements The patient's functional ability including ADLs,fall risks, home safety risks, cognitive, and hearing and visual impairment Diet and physical activities Evidence for depression   The patient's weight, height, BMI, and visual acuity have been recorded in the chart.  I have made referrals and provided education to the patient based on review of the above and I have provided the patient with a written personalized care plan for preventive services.    During the course of the visit the patient was educated and counseled about the following appropriate screening and preventive services:   Vaccines to include Pneumoccal, Influenza, Hepatitis B, Td, Zostavax, HCV  Colorectal cancer screening-UTD  Diabetes screening-followed by PCP  Prostate Cancer Screening-UTD  Glaucoma screening-eye exam every 4 months  Nutrition counseling   Patient Instructions (the written plan) was given to the patient.    Varney Biles, LPN  [phone]

## 2016-06-23 ENCOUNTER — Ambulatory Visit (INDEPENDENT_AMBULATORY_CARE_PROVIDER_SITE_OTHER): Payer: PPO | Admitting: *Deleted

## 2016-06-23 ENCOUNTER — Other Ambulatory Visit (INDEPENDENT_AMBULATORY_CARE_PROVIDER_SITE_OTHER): Payer: PPO

## 2016-06-23 ENCOUNTER — Other Ambulatory Visit: Payer: Self-pay | Admitting: Family Medicine

## 2016-06-23 DIAGNOSIS — Z992 Dependence on renal dialysis: Secondary | ICD-10-CM | POA: Diagnosis not present

## 2016-06-23 DIAGNOSIS — E119 Type 2 diabetes mellitus without complications: Secondary | ICD-10-CM

## 2016-06-23 DIAGNOSIS — E1165 Type 2 diabetes mellitus with hyperglycemia: Secondary | ICD-10-CM | POA: Diagnosis not present

## 2016-06-23 DIAGNOSIS — E1122 Type 2 diabetes mellitus with diabetic chronic kidney disease: Secondary | ICD-10-CM | POA: Diagnosis not present

## 2016-06-23 DIAGNOSIS — N186 End stage renal disease: Secondary | ICD-10-CM

## 2016-06-23 LAB — BASIC METABOLIC PANEL
BUN: 25 mg/dL — ABNORMAL HIGH (ref 6–23)
CALCIUM: 9.8 mg/dL (ref 8.4–10.5)
CHLORIDE: 101 meq/L (ref 96–112)
CO2: 26 meq/L (ref 19–32)
Creatinine, Ser: 1.42 mg/dL (ref 0.40–1.50)
GFR: 50.3 mL/min — ABNORMAL LOW (ref >60.00)
GLUCOSE: 341 mg/dL — AB (ref 70–99)
POTASSIUM: 4.7 meq/L (ref 3.5–5.1)
SODIUM: 138 meq/L (ref 135–145)

## 2016-06-23 LAB — GLUCOSE, POCT (MANUAL RESULT ENTRY): POC Glucose: 336 mg/dl — AB (ref 70–99)

## 2016-06-23 NOTE — Progress Notes (Signed)
Patient presented today because he thought he was taking CBG's incorrectly , patient demonstrated for nurse proper technic. CBG attained was 336 advised PCP and patient taken to lab for Bmet ordered by PCP.

## 2016-06-24 NOTE — Progress Notes (Signed)
CBGs elevated. Patient needs to increase his metformin to 500 mg twice daily for 1 week and then increase to 1000 mg in the morning and 500 mg at night for 1 week and then increase to 1000 mg twice daily. We will plan on seeing him back in 3-4 weeks with the pharmacist for recheck of his diabetes. Thanks.

## 2016-06-24 NOTE — Progress Notes (Signed)
Had write down direction for metformin increase and read back to nurse several times and set appointment up with Pharmacist in office on 08/01/16 FYI.

## 2016-06-26 NOTE — Progress Notes (Signed)
I have reviewed the above note and agree.  Michall Noffke, M.D.  

## 2016-06-27 ENCOUNTER — Telehealth: Payer: Self-pay | Admitting: Family Medicine

## 2016-06-27 NOTE — Telephone Encounter (Signed)
The patient returned your call in regards to his lab results.

## 2016-06-27 NOTE — Telephone Encounter (Signed)
Spoke with daughter, gave results and scheduled appt with Pharmacist in the office.

## 2016-06-30 ENCOUNTER — Other Ambulatory Visit: Payer: Self-pay

## 2016-06-30 MED ORDER — TORSEMIDE 10 MG PO TABS: 10.0000 mg | ORAL_TABLET | Freq: Two times a day (BID) | ORAL | 3 refills | Status: DC | PRN

## 2016-06-30 MED ORDER — GLIPIZIDE ER 10 MG PO TB24: 10.0000 mg | ORAL_TABLET | Freq: Two times a day (BID) | ORAL | 3 refills | Status: DC

## 2016-06-30 MED ORDER — CARVEDILOL 3.125 MG PO TABS: 3.1250 mg | ORAL_TABLET | Freq: Two times a day (BID) | ORAL | 3 refills | Status: DC

## 2016-06-30 NOTE — Telephone Encounter (Signed)
Refill sent to pharmacy.   

## 2016-06-30 NOTE — Telephone Encounter (Signed)
Last Ov 05/06/16 last filled by Dr.Walker 04/29/16

## 2016-07-04 ENCOUNTER — Encounter: Payer: Self-pay | Admitting: Pharmacist

## 2016-07-04 ENCOUNTER — Ambulatory Visit (INDEPENDENT_AMBULATORY_CARE_PROVIDER_SITE_OTHER): Payer: PPO | Admitting: Pharmacist

## 2016-07-04 VITALS — BP 106/64 | HR 84 | Wt 192.0 lb

## 2016-07-04 DIAGNOSIS — E113492 Type 2 diabetes mellitus with severe nonproliferative diabetic retinopathy without macular edema, left eye: Secondary | ICD-10-CM

## 2016-07-04 DIAGNOSIS — E1165 Type 2 diabetes mellitus with hyperglycemia: Secondary | ICD-10-CM | POA: Diagnosis not present

## 2016-07-04 DIAGNOSIS — I2510 Atherosclerotic heart disease of native coronary artery without angina pectoris: Secondary | ICD-10-CM | POA: Diagnosis not present

## 2016-07-04 DIAGNOSIS — I10 Essential (primary) hypertension: Secondary | ICD-10-CM | POA: Diagnosis not present

## 2016-07-04 MED ORDER — METFORMIN HCL ER 500 MG PO TB24: 1000.0000 mg | ORAL_TABLET | Freq: Two times a day (BID) | ORAL | 1 refills | Status: DC

## 2016-07-04 MED ORDER — NITROGLYCERIN 0.4 MG SL SUBL: 0.4000 mg | SUBLINGUAL_TABLET | SUBLINGUAL | 0 refills | Status: DC | PRN

## 2016-07-04 NOTE — Progress Notes (Signed)
S:    Chief Complaint  Patient presents with  . Medication Management    Diabetes   Patient arrives in good spirits ambulating without assistance accompanied by his daughter.  Presents for diabetes evaluation, education, and management at the request of Dr [name]. Patient was referred on 06/23/16.  Patient was last seen by Primary Care Provider on 05/06/2016. Today states his CBGs have improved with metformin and does report diarrhea occasionally but states this is not bothersome currently.   Patient reports Diabetes was diagnosed in 2013-2014.   Patient reports adherence with medications.  Current diabetes medications include:glipizide XL 10 mg daily, metformin 1000 mg every morning 500 mg every evening (plans to increase to 1000 mg BID this Saturday per Dr [name]), Januvia 50 mg daily Current hypertension medications include: carvedilol 3.125 mg BID, torsemide 20 mg QAM and 10 mg QPM.   Patient denies hypoglycemic events.  Patient reported dietary habits: Eats 3 meals/day.  Breakfast: eggs, bacon or sausage (mostly avoids bread).   Lunch:BLT with white bread Dinner: Green beans or vegetable soup  Snacks:2-3 peanut butter crackers or 2-3 sugar free wafers or halo tangerines Drinks:Water, Diet Mountain Dew, Tea with splenda  Biggest Weakness: Homemade biscuits and gravy (only eats occasionally).   Patient reported exercise habits: No structured exercise.  Walks at [employer] for a few hours 1-2 times per week.     Patient reports nocturia 2-3 times per night. Denies pain/burning when urinating.    Patient reports occasional neuropathy in hands.  Also report occasional cramping in lower legs.  Patient denies visual changes. Patient reports self foot exams. Denies changes.    Reports CBG fasting today of 144 mg/dL.  Reports most values <200 since starting metformin.  Reports lowest value of 142 mg/dL and highest reading of 189 mg/dL.    Denies signs/symptoms of orthostasis.   Denies falls.    O:  Physical Exam  Vitals reviewed.  Review of Systems  Constitutional: Negative.      Lab Results  Component Value Date   HGBA1C 9.0 (H) 05/06/2016   Vitals:   07/04/16 1135 07/04/16 1203  BP: (!) 101/54 106/64  Pulse: 86 84   Lipid Panel     Component Value Date/Time   CHOL 135 08/05/2015 1405   TRIG 310.0 (H) 08/05/2015 1405   HDL 35.10 (L) 08/05/2015 1405   CHOLHDL 4 08/05/2015 1405   VLDL 62.0 (H) 08/05/2015 1405   LDLCALC 68 01/22/2015 1328   LDLDIRECT 80.0 08/05/2015 1405   A/P: Diabetes longstanding diagnosed currently uncontrolled. Patient denies hypoglycemic events and is able to verbalize appropriate hypoglycemia management plan. Patient reports adherence with medication. Control is suboptimal due to insulin resistance and diet. Following discussion and approval by Dr [name], the following medication changes were made:  Switched metformin IR to metformin XR to decrease risk of diarrhea.  Patient will continue to increase this week to metformin XR 1000 mg BID per Dr Biagio Quint Extensively discussed low carbohydrate diet including portion sizes, nutrition labels, and high carbohydrate foods Instructed patient to check FGB and 2 hour PPG (alternating after lunch and supper) -Will consider further titration of sitagliptin (borderline GFR for dose increase currently) or transition to GLP-1 at next visit.  May also consider addition of SGLT2 inhibitor (borderline GFR for initiation) Next A1C anticipated 08/2016.    ASCVD risk greater than 7.5%. Continued Aspirin 81 mg and Continued pravastatin 40 mg daily.   Hypertension longstanding diagnosed currently controlled with borderline low BP today  in clinic. Patient denies signs/symptoms of orthostasis.  Patient reports adherence with medication. Continue current medications. Would consider addition of low dose ACE inhibitor at future visit pending BP and SCr.   Coronary Artery Disease: Patient denies  chest pain or requirement of NTG use.  Patient has had NTG > 2 years without new prescription.  Have resent RX for NTG.   Written patient instructions provided.  Total time in face to face counseling 65 minutes.   Follow up in Pharmacist Clinic Visit in 2 weeks.     Patient was seen with Dr [name] today in clinic and medication changes were discussed and approved prior to initiation

## 2016-07-04 NOTE — Assessment & Plan Note (Signed)
Diabetes longstanding diagnosed currently uncontrolled. Patient denies hypoglycemic events and is able to verbalize appropriate hypoglycemia management plan. Patient reports adherence with medication. Control is suboptimal due to insulin resistance and diet. Following discussion and approval by Dr [name], the following medication changes were made:  Switched metformin IR to metformin XR to decrease risk of diarrhea.  Patient will continue to increase this week to metformin XR 1000 mg BID per Dr Biagio Quint Extensively discussed low carbohydrate diet including portion sizes, nutrition labels, and high carbohydrate foods Instructed patient to check FGB and 2 hour PPG (alternating after lunch and supper) -Will consider further titration of sitagliptin (borderline GFR for dose increase currently) or transition to GLP-1 at next visit.  May also consider addition of SGLT2 inhibitor (borderline GFR for initiation) Next A1C anticipated 08/2016.

## 2016-07-04 NOTE — Patient Instructions (Signed)
Starting this Saturday  Stop Metformin immediate release Start Metformin extended release 1000 mg (2 tablets) twice daily  Start checking blood glucose before breakfast and alternating 2 hours after meals  Followup with Bennye Alm, PharmD in 2 weeks

## 2016-07-04 NOTE — Assessment & Plan Note (Signed)
Hypertension longstanding diagnosed currently controlled with borderline low BP today in clinic. Patient denies signs/symptoms of orthostasis.  Patient reports adherence with medication. Continue current medications. Would consider addition of low dose ACE inhibitor at future visit pending BP and SCr.

## 2016-07-05 NOTE — Progress Notes (Signed)
I have reviewed the above note and agree.  Aleanna Menge, M.D.  

## 2016-07-07 NOTE — Telephone Encounter (Signed)
Agree that they should monitor and call if any concerns or low blood sugars. They should update [place] on Monday.

## 2016-07-07 NOTE — Telephone Encounter (Signed)
Spoke with patient blood sugar 248 at 300 pm 2 hours post prandial after lunch he had french fries at lunch and hush puppies along with BLT.  Fasting blood sugar was 150.

## 2016-07-07 NOTE — Telephone Encounter (Signed)
Pt daughter called back and stated that she thinks that what he had for lunch (BLT). Daughter says they are going to keep an eye, and will wait to see what happens over the weekend, if anything they will call in the beginning of week.   120 before lunch 200 after he ate

## 2016-07-07 NOTE — Telephone Encounter (Signed)
Patient called and advised of below

## 2016-07-07 NOTE — Telephone Encounter (Signed)
The patient's daughter called and stated that the patient's blood glucose reading was at 107 on 3.28.18 after working outside . His reading this morning was 147. His daughter is concerned that if he increase his medication that the pharmacist (Combs) has instructed that it would drop his sugars to low. The patient's daughter will call back to give his latest reading.

## 2016-07-18 ENCOUNTER — Encounter: Payer: Self-pay | Admitting: Pharmacist

## 2016-07-18 ENCOUNTER — Ambulatory Visit (INDEPENDENT_AMBULATORY_CARE_PROVIDER_SITE_OTHER): Payer: PPO | Admitting: Pharmacist

## 2016-07-18 DIAGNOSIS — I10 Essential (primary) hypertension: Secondary | ICD-10-CM

## 2016-07-18 DIAGNOSIS — E1165 Type 2 diabetes mellitus with hyperglycemia: Secondary | ICD-10-CM | POA: Diagnosis not present

## 2016-07-18 DIAGNOSIS — E782 Mixed hyperlipidemia: Secondary | ICD-10-CM | POA: Diagnosis not present

## 2016-07-18 DIAGNOSIS — E113492 Type 2 diabetes mellitus with severe nonproliferative diabetic retinopathy without macular edema, left eye: Secondary | ICD-10-CM

## 2016-07-18 DIAGNOSIS — J449 Chronic obstructive pulmonary disease, unspecified: Secondary | ICD-10-CM

## 2016-07-18 MED ORDER — TIOTROPIUM BROMIDE MONOHYDRATE 2.5 MCG/ACT IN AERS: 2.0000 | INHALATION_SPRAY | Freq: Every day | RESPIRATORY_TRACT | 3 refills | Status: DC

## 2016-07-18 MED ORDER — ALBUTEROL SULFATE HFA 108 (90 BASE) MCG/ACT IN AERS
2.0000 | INHALATION_SPRAY | Freq: Four times a day (QID) | RESPIRATORY_TRACT | 2 refills | Status: DC | PRN
Start: 2016-07-18 — End: 2016-09-30

## 2016-07-18 MED ORDER — TIOTROPIUM BROMIDE MONOHYDRATE 2.5 MCG/ACT IN AERS: 2.0000 | INHALATION_SPRAY | Freq: Every day | RESPIRATORY_TRACT | 0 refills | Status: DC

## 2016-07-18 NOTE — Progress Notes (Signed)
S:    Chief Complaint  Patient presents with  . Medication Management    Diabetes   Patient arrives in good spirits ambulating without assitance.  Presents for diabetes evaluation, education, and management at the request of Dr [name]. Patient was referred on 06/23/16 .  Patient was last seen by Primary Care Provider on 05/06/16 and last seen in pharmacy clinic on 07/04/16.   Patient reports adherence with medications.  Current diabetes medications include: glipizide XL 10 mg daily, metformin XR 1000 mg BID, Januvia 50 mg daily Current hypertension medications include: carvedilol 3.125 mg BID, torsemide 20 mg QAM and 10 mg QPM (skips Sunday mornings)  Patient denies hypoglycemic events.  Patient reported dietary habits: Eats 3 meals/day Salad 2-3 times per week with tuna or chicken   Breakfast:sausage, eggs (avoids bread)  Lunch:whole wheat bread, pimento cheese sandwich  Dinner:chicken in crock pot (not fried), in salad or chicken salad Snacks:peanut butter crackers and sugar-free candy   Drinks:water, diet mountain dew (about 3 times a day)   Patient reported exercise habits: yard work, house work regularly   Patient reports nocturia (3-4 times per night)  Patient denies neuropathy. Patient denies visual changes. Patient reports self foot exams.   COPD: patient reports he has albuterol and symbicort at home that were prescribed a few years ago.  He states he uses albuterol a couple times per week.  Reports shortness of breath daily with increased shortness of breath when walking on the level.   O:  Physical Exam  Musculoskeletal: He exhibits edema.  Vitals reviewed.  Review of Systems  Constitutional: Negative.      Lab Results  Component Value Date   HGBA1C 9.0 (H) 05/06/2016   Lipid Panel     Component Value Date/Time   CHOL 135 08/05/2015 1405   TRIG 310.0 (H) 08/05/2015 1405   HDL 35.10 (L) 08/05/2015 1405   CHOLHDL 4 08/05/2015 1405   VLDL 62.0 (H)  08/05/2015 1405   LDLCALC 68 01/22/2015 1328   LDLDIRECT 80.0 08/05/2015 1405    Vitals:   07/18/16 1137  BP: 129/64  Pulse: 80   Home fasting CBG: 154, 150, 147, 129, 132, 128, 122, 101, 111, 123, 116, 128, 139, 135  2 hour post-prandial after lunch: 148, 107, 248, 101, 150, 110, 158, 163, 114, 154, 140, 104, 105. 2 hour post-prandial after dinner: 146, 152, 90, 163, 99, 106, 108, 157, 95, 96, 195, 88, 129 mg/dL   A/P: Diabetes longstanding diagnosed currently uncontrolled but with improved CBGs. Patient denies hypoglycemic events and is able to verbalize appropriate hypoglycemia management plan. Patient reports adherence with medication. Control is suboptimal due to insulin resistance. Following discussion and approval by Dr [name], the following medication changes were made:  Contiue glipizide XL 10 mg daily, metformin XR 1000 mg BID, Januvia 50 mg daily Congratulated patient on low carbohydrate diet changes Instructed patient to check blood glucose daily  Next A1C anticipated 08/04/16.    COPD: Currently uncontrolled on no medications. PFT in 2013 revealed FEV1/FVC ratio of 55, mMRC today >2 without exacerbation history corresponding to GOLD B.   -Started Spiriva 2.5 mg 2 puffs daily.  Counseled on proper administration technique and difference between long acting and short acting inhalers. Sample provided.  -Started Albuterol PRN.  Discussed proper administration.   ASCVD risk greater than 7.5%. Continued Aspirin 81 mg and Continued pravastatin 40 mg daily.   Hypertension longstanding diagnosed currently controlled with borderline low BP today in clinic.  Patient denies signs/symptoms of orthostasis.  Patient reports adherence with medication. Continue current medications. Would consider addition of low dose ACE inhibitor at future visit pending BP and SCr.   Written patient instructions provided.  Total time in face to face counseling 60 minutes.   Follow up with Dr [name]  at the end of April.

## 2016-07-18 NOTE — Patient Instructions (Addendum)
Continue current diabetes medications  Start Spiriva Respimat 2.5 mcg - 2 puffs once daily  Start Albuterol 2 puffs every 6 hours as needed  Check fasting blood glucose.    Followup with Dr [name]

## 2016-07-18 NOTE — Assessment & Plan Note (Signed)
ASCVD risk greater than 7.5%. Continued Aspirin 81 mg and Continued pravastatin 40 mg daily.

## 2016-07-18 NOTE — Assessment & Plan Note (Signed)
COPD: Currently uncontrolled on no medications. PFT in 2013 revealed FEV1/FVC ratio of 55, mMRC today >2 without exacerbation history corresponding to GOLD B.   -Started Spiriva 2.5 mg 2 puffs daily.  Counseled on proper administration technique and difference between long acting and short acting inhalers. Sample provided.  -Started Albuterol PRN.  Discussed proper administration

## 2016-07-18 NOTE — Assessment & Plan Note (Signed)
Diabetes longstanding diagnosed currently uncontrolled but with improved CBGs. Patient denies hypoglycemic events and is able to verbalize appropriate hypoglycemia management plan. Patient reports adherence with medication. Control is suboptimal due to insulin resistance. Following discussion and approval by Dr [name], the following medication changes were made:  Contiue glipizide XL 10 mg daily, metformin XR 1000 mg BID, Januvia 50 mg daily Congratulated patient on low carbohydrate diet changes Instructed patient to check blood glucose daily  Next A1C anticipated 08/04/16.

## 2016-07-18 NOTE — Assessment & Plan Note (Signed)
Hypertension longstanding diagnosed currently controlled with borderline low BP today in clinic. Patient denies signs/symptoms of orthostasis.  Patient reports adherence with medication. Continue current medications. Would consider addition of low dose ACE inhibitor at future visit pending BP and SCr.

## 2016-07-22 ENCOUNTER — Telehealth: Payer: Self-pay | Admitting: Family Medicine

## 2016-07-22 DIAGNOSIS — M79641 Pain in right hand: Secondary | ICD-10-CM | POA: Diagnosis not present

## 2016-07-22 NOTE — Telephone Encounter (Signed)
Pt daughter [name] called and stated that pt has a blood clot under his skin on his hand. They stated that it is the size of an egg yolk. Pt was told that it was a blood clot from a nurse at a rest house that he visits. Sent call to [facility]riage.   Call [place] @ [phone]

## 2016-07-22 NOTE — Telephone Encounter (Signed)
Patient is at the walk in clinic at this time

## 2016-07-22 NOTE — Telephone Encounter (Signed)
FYI. Looks like he went to Urgent care.

## 2016-07-22 NOTE — Telephone Encounter (Signed)
Patient Name: Matthew Miles DOB: 13-Apr-1928 Initial Comment Caller states her dad has a blood clot under his hand. Size of a egg yolk. He's falling a lot lately. Three times in the last couple of weeks. Nurse Assessment Nurse: Kathi Ludwig, RN, Leana Roe Date/Time (Eastern Time): 07/22/2016 10:51:56 AM Confirm and document reason for call. If symptomatic, describe symptoms. ---Caller states her dad has a blood clot under his hand. Size of a egg yolk. He's falling a lot lately. Three times in the last couple of weeks. Fell Monday and injured hand stepping up on a curb. Can move fingers and wrist. Saw a nurse at rest home today and they said it need to be looked at and probably needs a xray. Does the patient have any new or worsening symptoms? ---Yes Will a triage be completed? ---Yes Related visit to physician within the last 2 weeks? ---No Does the PT have any chronic conditions? (i.e. diabetes, asthma, etc.) ---Yes List chronic conditions. ---Diabetes, COPD, Heart disease ( takes NTG), CHF Is this a behavioral health or substance abuse call? ---No Guidelines Guideline Title Affirmed Question Affirmed Notes Hand and Wrist Injury Large swelling or bruise (> 2 inches or 5 cm) Final Disposition User See Physician within [facility], RN, Tracie Comments Caller states she wants to see if something is available tomorrow at [facility]. Contact caller to see if pt still would like an appt at [facility] clinic, message left. Will try back later Spoke with caller and they have decided to go to UC today. Referrals REFERRED TO PCP OFFICE GO TO FACILITY OTHER - SPECIFY Disagree/Comply: Comply

## 2016-07-22 NOTE — Telephone Encounter (Signed)
Noted  

## 2016-07-28 NOTE — Progress Notes (Signed)
I have reviewed the above note and agree. I saw the patient in the office with the pharmacist.  Irean Kendricks, M.D.  

## 2016-07-30 ENCOUNTER — Emergency Department: Payer: PPO

## 2016-07-30 ENCOUNTER — Observation Stay
Admission: EM | Admit: 2016-07-30 | Discharge: 2016-07-31 | Disposition: A | Discharge status: Home / Self Care | Payer: PPO | Attending: Internal Medicine | Admitting: Internal Medicine

## 2016-07-30 DIAGNOSIS — I2510 Atherosclerotic heart disease of native coronary artery without angina pectoris: Secondary | ICD-10-CM | POA: Insufficient documentation

## 2016-07-30 DIAGNOSIS — I130 Hypertensive heart and chronic kidney disease with heart failure and stage 1 through stage 4 chronic kidney disease, or unspecified chronic kidney disease: Secondary | ICD-10-CM | POA: Diagnosis not present

## 2016-07-30 DIAGNOSIS — J449 Chronic obstructive pulmonary disease, unspecified: Secondary | ICD-10-CM | POA: Insufficient documentation

## 2016-07-30 DIAGNOSIS — I5032 Chronic diastolic (congestive) heart failure: Secondary | ICD-10-CM | POA: Diagnosis not present

## 2016-07-30 DIAGNOSIS — Z7982 Long term (current) use of aspirin: Secondary | ICD-10-CM | POA: Diagnosis not present

## 2016-07-30 DIAGNOSIS — Z8546 Personal history of malignant neoplasm of prostate: Secondary | ICD-10-CM | POA: Diagnosis not present

## 2016-07-30 DIAGNOSIS — Z923 Personal history of irradiation: Secondary | ICD-10-CM | POA: Diagnosis not present

## 2016-07-30 DIAGNOSIS — N183 Chronic kidney disease, stage 3 (moderate): Secondary | ICD-10-CM | POA: Diagnosis not present

## 2016-07-30 DIAGNOSIS — Z87891 Personal history of nicotine dependence: Secondary | ICD-10-CM | POA: Insufficient documentation

## 2016-07-30 DIAGNOSIS — I10 Essential (primary) hypertension: Secondary | ICD-10-CM | POA: Diagnosis not present

## 2016-07-30 DIAGNOSIS — E1122 Type 2 diabetes mellitus with diabetic chronic kidney disease: Secondary | ICD-10-CM | POA: Diagnosis not present

## 2016-07-30 DIAGNOSIS — K219 Gastro-esophageal reflux disease without esophagitis: Secondary | ICD-10-CM | POA: Insufficient documentation

## 2016-07-30 DIAGNOSIS — I252 Old myocardial infarction: Secondary | ICD-10-CM | POA: Insufficient documentation

## 2016-07-30 DIAGNOSIS — E7800 Pure hypercholesterolemia, unspecified: Secondary | ICD-10-CM | POA: Insufficient documentation

## 2016-07-30 DIAGNOSIS — R0789 Other chest pain: Secondary | ICD-10-CM | POA: Diagnosis not present

## 2016-07-30 DIAGNOSIS — Z79899 Other long term (current) drug therapy: Secondary | ICD-10-CM | POA: Insufficient documentation

## 2016-07-30 DIAGNOSIS — Z7984 Long term (current) use of oral hypoglycemic drugs: Secondary | ICD-10-CM | POA: Insufficient documentation

## 2016-07-30 DIAGNOSIS — R079 Chest pain, unspecified: Principal | ICD-10-CM | POA: Diagnosis present

## 2016-07-30 LAB — CBC
HCT: 36.6 % — ABNORMAL LOW (ref 40.0–52.0)
Hemoglobin: 12.3 g/dL — ABNORMAL LOW (ref 13.0–18.0)
MCH: 29.3 pg (ref 26.0–34.0)
MCHC: 33.7 g/dL (ref 32.0–36.0)
MCV: 86.8 fL (ref 80.0–100.0)
Platelets: 125 10*3/uL — ABNORMAL LOW (ref 150–440)
RBC: 4.22 MIL/uL — AB (ref 4.40–5.90)
RDW: 15.1 % — ABNORMAL HIGH (ref 11.5–14.5)
WBC: 5.8 10*3/uL (ref 3.8–10.6)

## 2016-07-30 LAB — BASIC METABOLIC PANEL
ANION GAP: 9 (ref 5–15)
BUN: 30 mg/dL — ABNORMAL HIGH (ref 6–20)
CALCIUM: 9.2 mg/dL (ref 8.9–10.3)
CO2: 26 mmol/L (ref 22–32)
CREATININE: 1.42 mg/dL — AB (ref 0.61–1.24)
Chloride: 105 mmol/L (ref 101–111)
GFR, EST AFRICAN AMERICAN: 50 mL/min — AB (ref >60)
GFR, EST NON AFRICAN AMERICAN: 43 mL/min — AB (ref >60)
Glucose, Bld: 107 mg/dL — ABNORMAL HIGH (ref 65–99)
Potassium: 4 mmol/L (ref 3.5–5.1)
SODIUM: 140 mmol/L (ref 135–145)

## 2016-07-30 LAB — TROPONIN I
Troponin I: <0.03 ng/mL (ref <0.03)
Troponin I: <0.03 ng/mL (ref <0.03)

## 2016-07-30 IMAGING — CR DG CHEST 2V
1 series · 2 of 2 positions shown · non-contrast
Comparison: [DATE]

CLINICAL DATA: indigestion/CP which started this am- took a nitro
today and pt also states burping. Former smoker. Hx of COPD, CHF,
CAD, Emphysema, and heart murmur.

EXAM:
CHEST  2 VIEW

[Series 1: dg chest 2 view · 0.14mm/px · 2 of 2 slices shown]
[im 1/2]
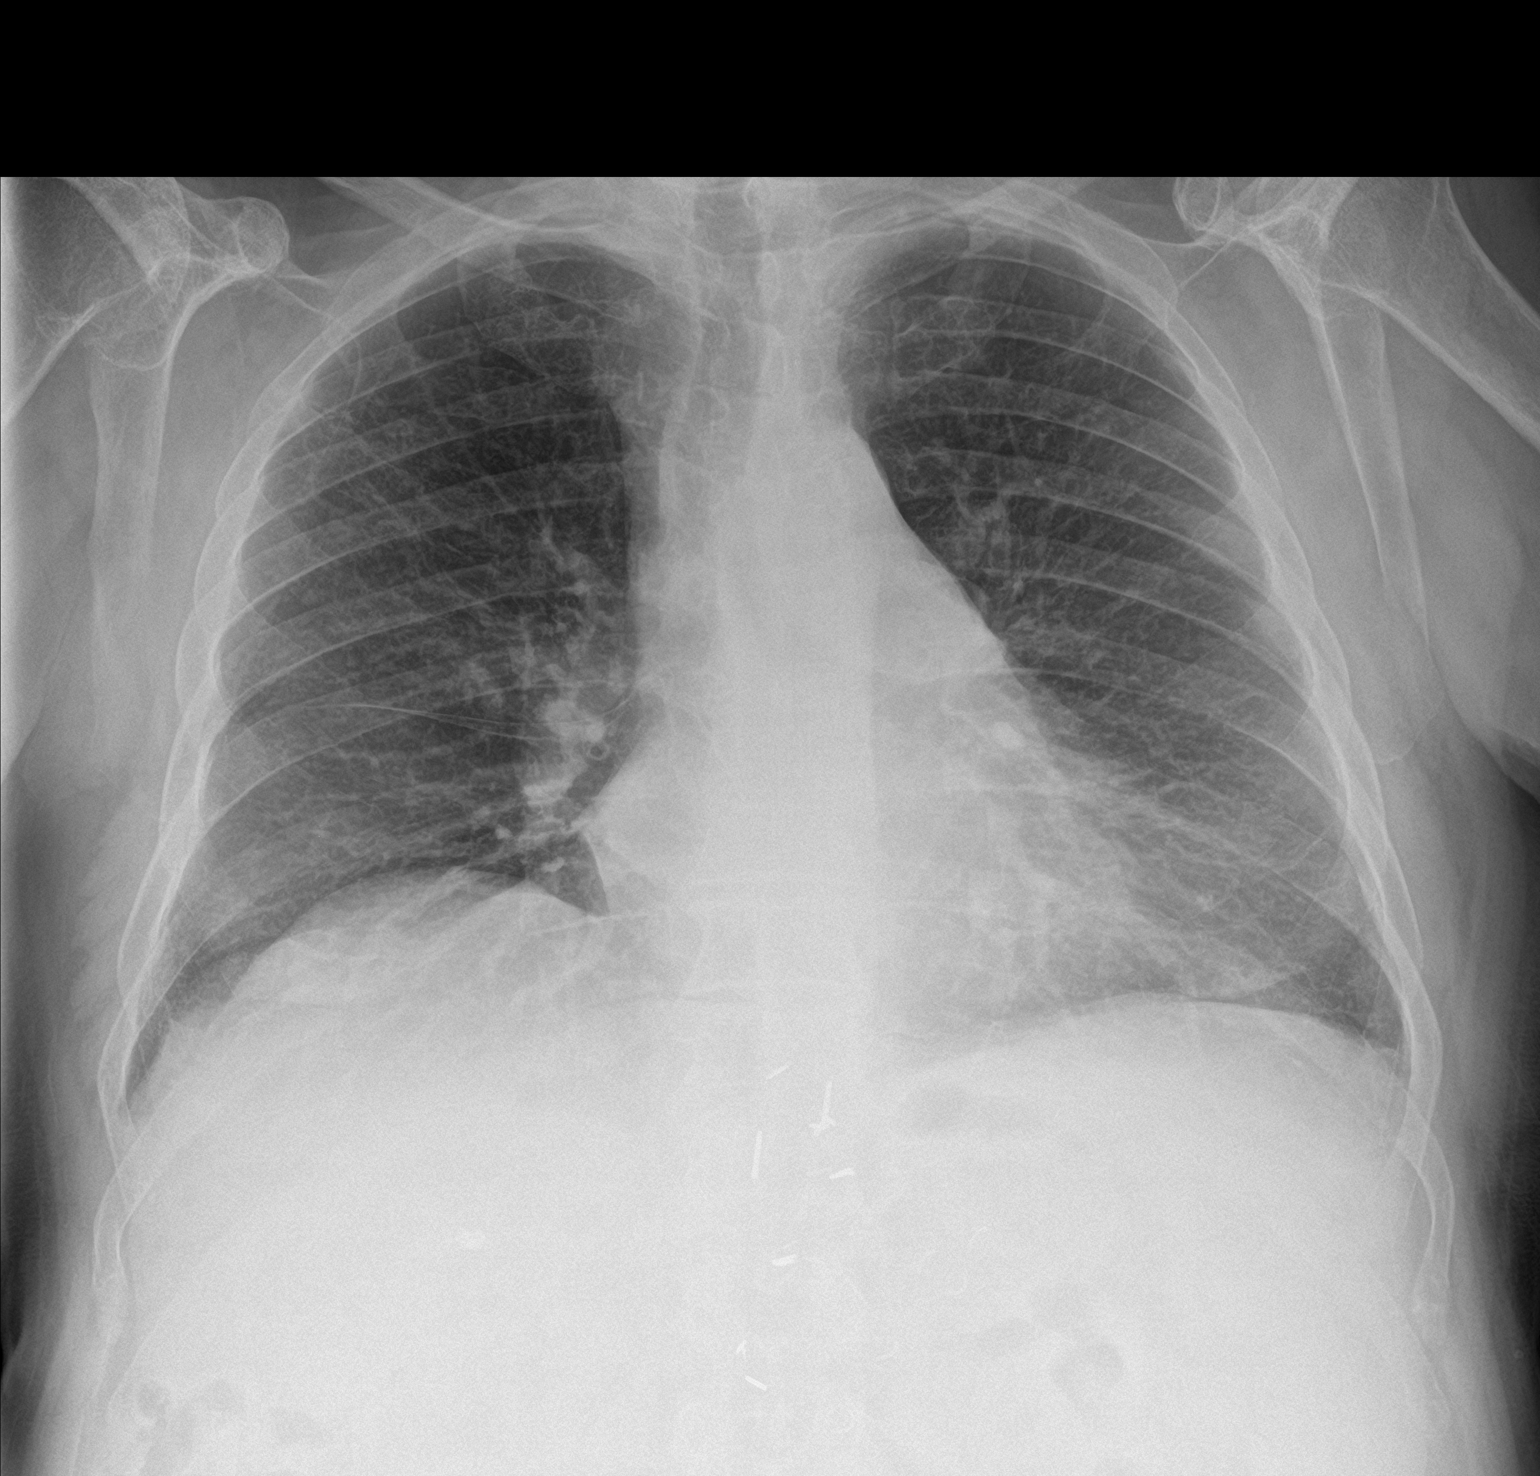
[im 2/2]
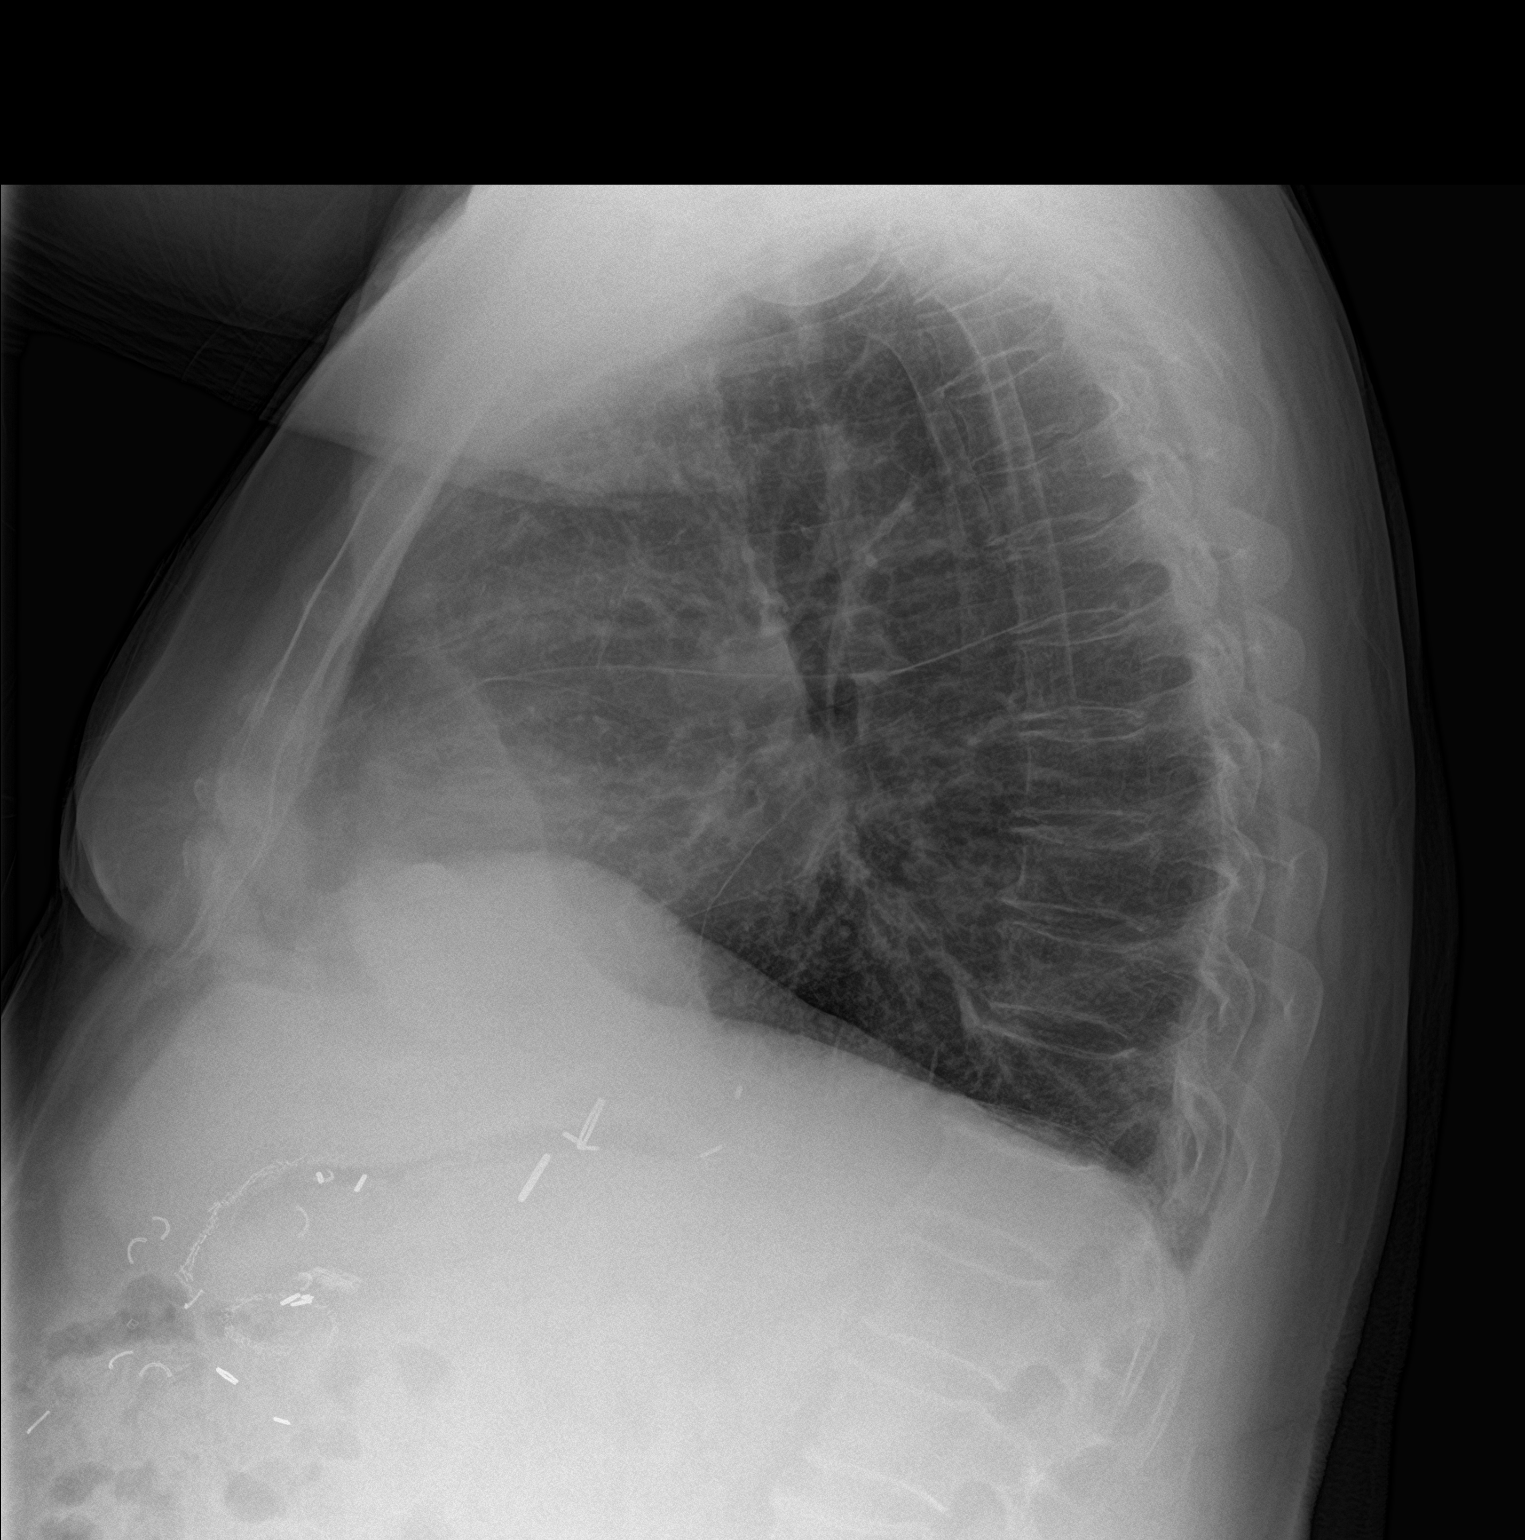

[2 of 2 positions shown; findings below may reference images not displayed]

FINDINGS: The cardiac silhouette is normal in size. No mediastinal or hilar
masses. No evidence of adenopathy.

There are prominent bronchovascular markings bilaterally similar to
the prior study. No evidence of pneumonia. No pulmonary edema.

No pleural effusion or pneumothorax.

Skeletal structures are demineralized but grossly intact.

Bowel anastomosis staples are multiple surgical vascular clips are
noted in the upper abdomen, stable.
IMPRESSION: No acute cardiopulmonary disease.

## 2016-07-30 MED ORDER — ENOXAPARIN SODIUM 40 MG/0.4ML ~~LOC~~ SOLN
40.0000 mg | SUBCUTANEOUS | Status: DC
  Administered 2016-07-30: 40 mg via SUBCUTANEOUS
  Filled 2016-07-30: qty 0.4

## 2016-07-30 MED ORDER — PRAVASTATIN SODIUM 40 MG PO TABS
40.0000 mg | ORAL_TABLET | Freq: Every evening | ORAL | Status: DC
  Administered 2016-07-30: 40 mg via ORAL
  Filled 2016-07-30: qty 1

## 2016-07-30 MED ORDER — LORATADINE 10 MG PO TABS
10.0000 mg | ORAL_TABLET | Freq: Every day | ORAL | Status: DC
  Administered 2016-07-31: 10 mg via ORAL
  Filled 2016-07-30: qty 1

## 2016-07-30 MED ORDER — PANTOPRAZOLE SODIUM 40 MG PO TBEC
40.0000 mg | DELAYED_RELEASE_TABLET | Freq: Two times a day (BID) | ORAL | Status: DC
  Administered 2016-07-30 – 2016-07-31 (×2): 40 mg via ORAL
  Filled 2016-07-30 (×2): qty 1

## 2016-07-30 MED ORDER — GI COCKTAIL ~~LOC~~
30.0000 mL | Freq: Once | ORAL | Status: AC
  Administered 2016-07-30: 30 mL via ORAL
  Filled 2016-07-30: qty 30

## 2016-07-30 MED ORDER — HYDRALAZINE HCL 20 MG/ML IJ SOLN: 10.0000 mg | Freq: Four times a day (QID) | INTRAMUSCULAR | Status: DC | PRN

## 2016-07-30 MED ORDER — ONDANSETRON HCL 4 MG/2ML IJ SOLN: 4.0000 mg | Freq: Four times a day (QID) | INTRAMUSCULAR | Status: DC | PRN

## 2016-07-30 MED ORDER — CARVEDILOL 3.125 MG PO TABS
3.1250 mg | ORAL_TABLET | Freq: Two times a day (BID) | ORAL | Status: DC
  Administered 2016-07-30 – 2016-07-31 (×2): 3.125 mg via ORAL
  Filled 2016-07-30 (×2): qty 1

## 2016-07-30 MED ORDER — VITAMIN B-12 100 MCG PO TABS
ORAL_TABLET | Freq: Two times a day (BID) | ORAL | Status: DC
  Administered 2016-07-30: 22:00:00 via ORAL
  Administered 2016-07-31: 100 ug via ORAL
  Filled 2016-07-30 (×2): qty 1

## 2016-07-30 MED ORDER — ASPIRIN EC 325 MG PO TBEC
325.0000 mg | DELAYED_RELEASE_TABLET | Freq: Every day | ORAL | Status: DC
  Administered 2016-07-31: 325 mg via ORAL
  Filled 2016-07-30: qty 1

## 2016-07-30 MED ORDER — GI COCKTAIL ~~LOC~~
30.0000 mL | Freq: Four times a day (QID) | ORAL | Status: DC | PRN
  Filled 2016-07-30: qty 30

## 2016-07-30 MED ORDER — CYCLOBENZAPRINE HCL 10 MG PO TABS: 5.0000 mg | ORAL_TABLET | Freq: Three times a day (TID) | ORAL | Status: DC | PRN

## 2016-07-30 MED ORDER — MORPHINE SULFATE (PF) 4 MG/ML IV SOLN: 2.0000 mg | INTRAVENOUS | Status: DC | PRN

## 2016-07-30 MED ORDER — TIOTROPIUM BROMIDE MONOHYDRATE 2.5 MCG/ACT IN AERS: 2.0000 | INHALATION_SPRAY | Freq: Every day | RESPIRATORY_TRACT | Status: DC

## 2016-07-30 MED ORDER — ALBUTEROL SULFATE (2.5 MG/3ML) 0.083% IN NEBU: 3.0000 mL | INHALATION_SOLUTION | Freq: Four times a day (QID) | RESPIRATORY_TRACT | Status: DC | PRN

## 2016-07-30 MED ORDER — NITROGLYCERIN 0.4 MG SL SUBL: 0.4000 mg | SUBLINGUAL_TABLET | SUBLINGUAL | Status: DC | PRN

## 2016-07-30 MED ORDER — ASPIRIN EC 81 MG PO TBEC: 81.0000 mg | DELAYED_RELEASE_TABLET | Freq: Every day | ORAL | Status: DC

## 2016-07-30 MED ORDER — ACETAMINOPHEN 325 MG PO TABS: 650.0000 mg | ORAL_TABLET | ORAL | Status: DC | PRN

## 2016-07-30 MED ORDER — ASPIRIN 81 MG PO CHEW
162.0000 mg | CHEWABLE_TABLET | Freq: Once | ORAL | Status: AC
  Administered 2016-07-30: 162 mg via ORAL
  Filled 2016-07-30: qty 2

## 2016-07-30 NOTE — ED Notes (Signed)
Patient transported to X-ray 

## 2016-07-30 NOTE — ED Provider Notes (Signed)
[facility] Emergency Department Provider Note  ____________________________________________   First MD Initiated Contact with Patient 07/30/16 1425     (approximate)  I have reviewed the triage vital signs and the nursing notes.   HISTORY  Chief Complaint Chest Pain    HPI Matthew Evetts. is a 83 y.o. male who self presents to the emergency department with pressure-like chest pain that began when he awoke. It lasts 5-10 minutes at a time and then resolves and then recurs. He took a nitroglycerin at home which did not help. He has a long-standing history of coronary artery disease and has had a myocardial infarction in the past as well as congestive heart failure and COPD. He has reported recent worsening of his exertional shortness of breath. He is not currently coughing. He denies fevers or chills.He also reports some feeling of indigestion and belching.   Past Medical History:  Diagnosis Date  . Allergy   . Arthritis   . CHF (congestive heart failure) ([place])   . Chicken pox   . Cholecystitis   . Colon polyps   . COPD (chronic obstructive pulmonary disease) ([place])   . Coronary artery disease   . Emphysema of lung ([place])   . GERD (gastroesophageal reflux disease)   . Heart murmur   . Hematemesis/vomiting blood 04/10/11  . Hypercholesterolemia   . Mild hypertension   . Pancreatitis   . Prostate cancer ([place])   . Prostate cancer [facility])    radiation therapy   . Smoker   . Ulcer   . Upper GI bleed 1980    Patient Active Problem List   Diagnosis Date Noted  . Allergic conjunctivitis 02/03/2016  . Hip pain, right 02/03/2016  . Actinic keratoses 12/24/2015  . Osteoarthritis of both hands 12/24/2015  . Obesity (BMI 30-39.9) 10/21/2014  . Essential hypertension 07/21/2014  . Alopecia areata 04/21/2014  . Murmur, cardiac 10/15/2013  . Chronic diastolic CHF (congestive heart failure) ([place]) 04/15/2013  . Thrombocytopenia ([place]) 08/21/2012  . Urge  incontinence 07/02/2012  . Hyperlipidemia 06/08/2012  . Balanoposthitis 06/04/2012  . H/O malignant neoplasm of prostate 06/04/2012  . Diabetes mellitus type 2, uncontrolled ([place]) 04/26/2012  . Iron deficiency anemia 01/06/2012  . Pulmonary hypertension ([place]) 11/25/2011  . GERD (gastroesophageal reflux disease) 06/13/2011  . Coronary artery disease 04/26/2011  . Tachycardia 04/26/2011  . COPD (chronic obstructive pulmonary disease) ([place])     Past Surgical History:  Procedure Laterality Date  . APPENDECTOMY    . CARDIAC CATHETERIZATION  May 2002 and Feb 2013   [facility]; no stents   . CATARACT EXTRACTION    . CHOLECYSTECTOMY    . CIRCUMCISION    . COLONOSCOPY    . HEMORRHOID SURGERY    . SP CHOLECYSTOMY    . STOMACH SURGERY     bleeding ulcers, followed by Dr. [name]    Prior to Admission medications   Medication Sig Start Date End Date Taking? Authorizing Provider  albuterol (PROVENTIL HFA;VENTOLIN HFA) 108 (90 Base) MCG/ACT inhaler Inhale 2 puffs into the lungs every 6 (six) hours as needed for wheezing or shortness of breath. 07/18/16   [name], MD  aspirin 81 MG tablet Take 81 mg by mouth daily.    Historical Provider, MD  Blood Glucose Monitoring Suppl W/DEVICE KIT Accu-chek Aviva Plus. Use as directed to check blood sugar two times a day. Dx. E11.65 10/21/14   Jackolyn Confer, MD  carvedilol (COREG) 3.125 MG tablet Take 1 tablet (  3.125 mg total) by mouth 2 (two) times daily. 06/30/16   [name], MD  Cyanocobalamin (B-12) 50 MCG TABS Take 1 tablet by mouth 2 (two) times daily.     Historical Provider, MD  cyclobenzaprine (FLEXERIL) 5 MG tablet Take 1 tablet (5 mg total) by mouth 3 (three) times daily as needed for muscle spasms. 10/08/13   Jackolyn Confer, MD  glipiZIDE (GLUCOTROL XL) 10 MG 24 hr tablet Take 1 tablet (10 mg total) by mouth 2 (two) times daily. 06/30/16   [name], MD  glucose blood test strip One touch  berio  test strips. Use to check  blood sugar two times a day. Dx. E11.9 05/12/16   [name], MD  loratadine (CLARITIN) 10 MG tablet Take 1 tablet (10 mg total) by mouth daily. Patient not taking: Reported on 07/18/2016 02/03/16   [name], MD  metFORMIN (GLUCOPHAGE XR) 500 MG 24 hr tablet Take 2 tablets (1,000 mg total) by mouth 2 (two) times daily. 07/04/16   [name], MD  nitroGLYCERIN (NITROSTAT) 0.4 MG SL tablet Place 1 tablet (0.4 mg total) under the tongue every 5 (five) minutes as needed. 07/04/16   [name], MD  omeprazole (PRILOSEC) 20 MG capsule Take 1 capsule (20 mg total) by mouth 2 (two) times daily before a meal. 08/05/15   Jackolyn Confer, MD  potassium chloride SA (K-DUR,KLOR-CON) 20 MEQ tablet Take 1 tablet (20 mEq total) by mouth daily. 03/30/16   [name], MD  pravastatin (PRAVACHOL) 40 MG tablet Take 1 tablet (40 mg total) by mouth daily. 04/30/15   Jackolyn Confer, MD  sitaGLIPtin (JANUVIA) 50 MG tablet Take 1 tablet (50 mg total) by mouth daily. 02/03/16   [name], MD  Tiotropium Bromide Monohydrate (SPIRIVA RESPIMAT) 2.5 MCG/ACT AERS Inhale 2 puffs into the lungs daily. 07/18/16   [name], MD  Tiotropium Bromide Monohydrate (SPIRIVA RESPIMAT) 2.5 MCG/ACT AERS Inhale 2 puffs into the lungs daily. 07/18/16   [name], MD  torsemide (DEMADEX) 10 MG tablet Take 1 tablet (10 mg total) by mouth 2 (two) times daily as needed. Patient taking differently: Take 10-20 mg by mouth 2 (two) times daily. 20 mg every morning and 10 mg every evening (skips on Sundays mornings). 06/30/16 07/06/17  [name], MD    Allergies Sulfa antibiotics and Tradjenta [linagliptin]  Family History  Problem Relation Age of Onset  . Prostate cancer Father   . Liver cancer Brother   . Prostate cancer Brother   . Emphysema Sister     smoker    Social History Social History  Substance Use Topics  . Smoking status: Former Smoker    Packs/day: 1.00    Years:  65.00    Types: Cigarettes    Quit date: 04/10/2011  . Smokeless tobacco: Former Systems developer    Types: Chew  . Alcohol use Yes     Comment: 1 beer rarely    Review of Systems Constitutional: No fever/chills Eyes: No visual changes. ENT: No sore throat. Cardiovascular: Positive chest pain. Respiratory: Positive shortness of breath. Gastrointestinal: No abdominal pain.  No nausea, no vomiting.  No diarrhea.  No constipation. Genitourinary: Negative for dysuria. Musculoskeletal: Negative for back pain. Skin: Negative for rash. Neurological: Negative for headaches, focal weakness or numbness.  10-point ROS otherwise negative.  ____________________________________________   PHYSICAL EXAM:  VITAL SIGNS: ED Triage Vitals  Enc Vitals Group  BP 07/30/16 1314 134/60     Pulse Rate 07/30/16 1314 86     Resp 07/30/16 1314 16     Temp 07/30/16 1314 97.5 F (36.4 C)     Temp Source 07/30/16 1314 Oral     SpO2 07/30/16 1314 97 %     Weight 07/30/16 1314 180 lb (81.6 kg)     Height 07/30/16 1314 _0  (1.575 m)     Head Circumference --      Peak Flow --      Pain Score 07/30/16 1321 5     Pain Loc --      Pain Edu? --      Excl. in [place]? --     Constitutional: Alert and oriented x 4 well appearing nontoxic no diaphoresis speaks in full, clear sentences Eyes: PERRL EOMI. Head: Atraumatic. Nose: No congestion/rhinnorhea. Mouth/Throat: No trismus Neck: No stridor.   Cardiovascular: Normal rate, regular rhythm. Grossly normal heart sounds.  Good peripheral circulation.Difficulty lying completely flat and does have jugular venous distention Respiratory: Normal respiratory effort.  No retractions. Lungs CTAB and moving good air Gastrointestinal: Soft nondistended nontender no rebound no guarding no peritonitis no McBurney's tenderness negative Rovsing's no costovertebral tenderness negative Murphy's Musculoskeletal:1+ pitting edema bilateral lower extremities but legs are equal in size    Neurologic:  Normal speech and language. No gross focal neurologic deficits are appreciated. Skin:  Skin is warm, dry and intact. No rash noted. Psychiatric: Mood and affect are normal. Speech and behavior are normal.    ____________________________________________   DIFFERENTIAL  Acute coronary syndrome, gastric reflux, COPD, CHF exacerbation, fluid overload   LABS (all labs ordered are listed, but only abnormal results are displayed)  Labs Reviewed  BASIC METABOLIC PANEL - Abnormal; Notable for the following:       Result Value   Glucose, Bld 107 (*)    BUN 30 (*)    Creatinine, Ser 1.42 (*)    GFR calc non Af Amer 43 (*)    GFR calc Af Amer 50 (*)    All other components within normal limits  CBC - Abnormal; Notable for the following:    RBC 4.22 (*)    Hemoglobin 12.3 (*)    HCT 36.6 (*)    RDW 15.1 (*)    Platelets 125 (*)    All other components within normal limits  TROPONIN I    No signs of acute ischemia __________________________________________  EKG  ED ECG REPORT I, Darel Hong, the attending physician, personally viewed and interpreted this ECG.  Date: 07/30/2016 Rate: 83 Rhythm: normal sinus rhythm QRS Axis: Leftward axis Intervals: normal ST/T Wave abnormalities: normal Conduction Disturbances: none Narrative Interpretation: Abnormal but appears unchanged from previous in December 2017  ____________________________________________  RADIOLOGY  Chest x-ray with no acute disease ____________________________________________   PROCEDURES  Procedure(s) performed: no  Procedures  Critical Care performed: no  ____________________________________________   INITIAL IMPRESSION / ASSESSMENT AND PLAN / ED COURSE  Pertinent labs & imaging results that were available during my care of the patient were reviewed by me and considered in my medical decision making (see chart for details).  By the time I saw the patient he had artery had a  first troponin which was negative.  His HEART score is 6.  I will give him an aspirin now and at this point he requires inpatient admission for full cardiac rule out and possible provocative testing.      ____________________________________________   FINAL  CLINICAL IMPRESSION(S) / ED DIAGNOSES  Final diagnoses:  Chest pain, unspecified type      NEW MEDICATIONS STARTED DURING THIS VISIT:  New Prescriptions   No medications on file     Note:  This document was prepared using Dragon voice recognition software and may include unintentional dictation errors.     Darel Hong, MD 07/30/16 1453

## 2016-07-30 NOTE — H&P (Signed)
[place] [facility] [place] NAME: Matthew Miles    MR#:  [id number]  DATE OF BIRTH:  [birth year]  DATE OF ADMISSION:  07/30/2016  PRIMARY CARE PHYSICIAN: [name], MD   REQUESTING/REFERRING PHYSICIAN: Darel Hong, MD  CHIEF COMPLAINT:   Chief Complaint  Patient presents with  . Chest Pain   Chest indigestion today. HISTORY OF PRESENT ILLNESS:  Matthew Miles  is a 83 y.o. male with a known history of HTN, CAD, CHF, COPD, GERD and prostate cancer. He complained of chest indigestion for the whole am, denies any nausea or vomiting.He took nitroglycerin without improvement. ED physician ask for observation.  PAST MEDICAL HISTORY:   Past Medical History:  Diagnosis Date  . Allergy   . Arthritis   . CHF (congestive heart failure) ([place])   . Chicken pox   . Cholecystitis   . Colon polyps   . COPD (chronic obstructive pulmonary disease) ([place])   . Coronary artery disease   . Emphysema of lung ([place])   . GERD (gastroesophageal reflux disease)   . Heart murmur   . Hematemesis/vomiting blood 04/10/11  . Hypercholesterolemia   . Mild hypertension   . Pancreatitis   . Prostate cancer ([place])   . Prostate cancer [facility])    radiation therapy   . Smoker   . Ulcer   . Upper GI bleed 1980    PAST SURGICAL HISTORY:   Past Surgical History:  Procedure Laterality Date  . APPENDECTOMY    . CARDIAC CATHETERIZATION  May 2002 and Feb 2013   [facility]; no stents   . CATARACT EXTRACTION    . CHOLECYSTECTOMY    . CIRCUMCISION    . COLONOSCOPY    . HEMORRHOID SURGERY    . SP CHOLECYSTOMY    . STOMACH SURGERY     bleeding ulcers, followed by Dr. [name]    SOCIAL HISTORY:   Social History  Substance Use Topics  . Smoking status: Former Smoker    Packs/day: 1.00    Years: 65.00    Types: Cigarettes    Quit date: 04/10/2011  . Smokeless tobacco: Former Systems developer    Types: Chew  . Alcohol use Yes     Comment: 1 beer rarely    FAMILY HISTORY:    Family History  Problem Relation Age of Onset  . Prostate cancer Father   . Liver cancer Brother   . Prostate cancer Brother   . Emphysema Sister     smoker    DRUG ALLERGIES:   Allergies  Allergen Reactions  . Sulfa Antibiotics     GI upset  . Tradjenta [Linagliptin] Other (See Comments)    Hair loss    REVIEW OF SYSTEMS:   Review of Systems  Constitutional: Negative for chills, fever and malaise/fatigue.  HENT: Negative for congestion and sore throat.   Eyes: Negative for blurred vision and double vision.  Respiratory: Negative for cough, shortness of breath and wheezing.   Cardiovascular: Positive for chest pain. Negative for palpitations, orthopnea and leg swelling.  Gastrointestinal: Positive for heartburn. Negative for abdominal pain, blood in stool, constipation, diarrhea, melena, nausea and vomiting.  Genitourinary: Negative for dysuria and hematuria.  Musculoskeletal: Negative for back pain.  Skin: Negative for itching and rash.  Neurological: Negative for dizziness, focal weakness, seizures, loss of consciousness, weakness and headaches.  Psychiatric/Behavioral: Negative for depression. The patient is not nervous/anxious.     MEDICATIONS AT HOME:   Prior to Admission medications  Medication Sig Start Date End Date Taking? Authorizing Provider  albuterol (PROVENTIL HFA;VENTOLIN HFA) 108 (90 Base) MCG/ACT inhaler Inhale 2 puffs into the lungs every 6 (six) hours as needed for wheezing or shortness of breath. 07/18/16   [name], MD  aspirin 81 MG tablet Take 81 mg by mouth daily.    Historical Provider, MD  Blood Glucose Monitoring Suppl W/DEVICE KIT Accu-chek Aviva Plus. Use as directed to check blood sugar two times a day. Dx. E11.65 10/21/14   Jackolyn Confer, MD  carvedilol (COREG) 3.125 MG tablet Take 1 tablet (3.125 mg total) by mouth 2 (two) times daily. 06/30/16   [name], MD  Cyanocobalamin (B-12) 50 MCG TABS Take 1 tablet by mouth 2  (two) times daily.     Historical Provider, MD  cyclobenzaprine (FLEXERIL) 5 MG tablet Take 1 tablet (5 mg total) by mouth 3 (three) times daily as needed for muscle spasms. 10/08/13   Jackolyn Confer, MD  glipiZIDE (GLUCOTROL XL) 10 MG 24 hr tablet Take 1 tablet (10 mg total) by mouth 2 (two) times daily. 06/30/16   [name], MD  glucose blood test strip One touch  berio  test strips. Use to check blood sugar two times a day. Dx. E11.9 05/12/16   [name], MD  loratadine (CLARITIN) 10 MG tablet Take 1 tablet (10 mg total) by mouth daily. Patient not taking: Reported on 07/18/2016 02/03/16   [name], MD  metFORMIN (GLUCOPHAGE XR) 500 MG 24 hr tablet Take 2 tablets (1,000 mg total) by mouth 2 (two) times daily. 07/04/16   [name], MD  nitroGLYCERIN (NITROSTAT) 0.4 MG SL tablet Place 1 tablet (0.4 mg total) under the tongue every 5 (five) minutes as needed. 07/04/16   [name], MD  omeprazole (PRILOSEC) 20 MG capsule Take 1 capsule (20 mg total) by mouth 2 (two) times daily before a meal. 08/05/15   Jackolyn Confer, MD  potassium chloride SA (K-DUR,KLOR-CON) 20 MEQ tablet Take 1 tablet (20 mEq total) by mouth daily. 03/30/16   [name], MD  pravastatin (PRAVACHOL) 40 MG tablet Take 1 tablet (40 mg total) by mouth daily. 04/30/15   Jackolyn Confer, MD  sitaGLIPtin (JANUVIA) 50 MG tablet Take 1 tablet (50 mg total) by mouth daily. 02/03/16   [name], MD  Tiotropium Bromide Monohydrate (SPIRIVA RESPIMAT) 2.5 MCG/ACT AERS Inhale 2 puffs into the lungs daily. 07/18/16   [name], MD  Tiotropium Bromide Monohydrate (SPIRIVA RESPIMAT) 2.5 MCG/ACT AERS Inhale 2 puffs into the lungs daily. 07/18/16   [name], MD  torsemide (DEMADEX) 10 MG tablet Take 1 tablet (10 mg total) by mouth 2 (two) times daily as needed. Patient taking differently: Take 10-20 mg by mouth 2 (two) times daily. 20 mg every morning and 10 mg every evening (skips on  Sundays mornings). 06/30/16 07/06/17  [name], MD      VITAL SIGNS:  Blood pressure (!) 104/52, pulse 79, temperature 97.5 F (36.4 C), temperature source Oral, resp. rate 14, height 5' 2"  (1.575 m), weight 180 lb (81.6 kg), SpO2 95 %.  PHYSICAL EXAMINATION:  Physical Exam  GENERAL:  83 y.o.-year-old patient lying in the bed with no acute distress.  EYES: Pupils equal, round, reactive to light and accommodation. No scleral icterus. Extraocular muscles intact.  HEENT: Head atraumatic, normocephalic. Oropharynx and nasopharynx clear.  NECK:  Supple, no jugular venous distention. No thyroid  enlargement, no tenderness.  LUNGS: Normal breath sounds bilaterally, no wheezing, rales,rhonchi or crepitation. No use of accessory muscles of respiration.  CARDIOVASCULAR: S1, S2 normal. No murmurs, rubs, or gallops.  ABDOMEN: Soft, nontender, nondistended. Bowel sounds present. No organomegaly or mass.  EXTREMITIES: No pedal edema, cyanosis, or clubbing.  NEUROLOGIC: Cranial nerves II through XII are intact. Muscle strength 5/5 in all extremities. Sensation intact. Gait not checked.  PSYCHIATRIC: The patient is alert and oriented x 3.  SKIN: No obvious rash, lesion, or ulcer.   LABORATORY PANEL:   CBC  Recent Labs Lab 07/30/16 1321  WBC 5.8  HGB 12.3*  HCT 36.6*  PLT 125*   ------------------------------------------------------------------------------------------------------------------  Chemistries   Recent Labs Lab 07/30/16 1321  NA 140  K 4.0  CL 105  CO2 26  GLUCOSE 107*  BUN 30*  CREATININE 1.42*  CALCIUM 9.2   ------------------------------------------------------------------------------------------------------------------  Cardiac Enzymes  Recent Labs Lab 07/30/16 1321  TROPONINI <0.03   ------------------------------------------------------------------------------------------------------------------  RADIOLOGY:  Dg Chest 2 View  Result Date:  07/30/2016 CLINICAL DATA:  indigestion/CP which started this am- took a nitro today and pt also states burping. Former smoker. Hx of COPD, CHF, CAD, Emphysema, and heart murmur. EXAM: CHEST  2 VIEW COMPARISON:  08/10/2012 FINDINGS: The cardiac silhouette is normal in size. No mediastinal or hilar masses. No evidence of adenopathy. There are prominent bronchovascular markings bilaterally similar to the prior study. No evidence of pneumonia. No pulmonary edema. No pleural effusion or pneumothorax. Skeletal structures are demineralized but grossly intact. Bowel anastomosis staples are multiple surgical vascular clips are noted in the upper abdomen, stable. IMPRESSION: No acute cardiopulmonary disease. Electronically Signed   By: Lajean Manes M.D.   On: 07/30/2016 13:49      IMPRESSION AND PLAN:   Atypical chest pain. Observation. f/u troponin, continue ASA. Cardiology consult.  GERD.  protonix.  CKD stage 3. Stable.  HTN. Continue HTN medication, hold lasix due to low side BP.  All the records are reviewed and case discussed with ED provider. Management plans discussed with the patient, his daughter and son and they are in agreement.  CODE STATUS: full code.  TOTAL TIME TAKING CARE OF THIS PATIENT:  45 minutes.    Demetrios Loll M.D on 07/30/2016 at 3:26 PM  Between 7am to 6pm - Pager - [phone]  After 6pm go to [email or web address] - Proofreader  [facility] [place]  Office  [phone]  CC: Primary care physician; [name], MD   Note: This dictation was prepared with Dragon dictation along with smaller phrase technology. Any transcriptional errors that result from this process are unintentional.

## 2016-07-31 DIAGNOSIS — R079 Chest pain, unspecified: Secondary | ICD-10-CM

## 2016-07-31 DIAGNOSIS — R0789 Other chest pain: Secondary | ICD-10-CM | POA: Diagnosis not present

## 2016-07-31 DIAGNOSIS — I2510 Atherosclerotic heart disease of native coronary artery without angina pectoris: Secondary | ICD-10-CM

## 2016-07-31 DIAGNOSIS — N183 Chronic kidney disease, stage 3 (moderate): Secondary | ICD-10-CM | POA: Diagnosis not present

## 2016-07-31 DIAGNOSIS — I10 Essential (primary) hypertension: Secondary | ICD-10-CM | POA: Diagnosis not present

## 2016-07-31 DIAGNOSIS — K219 Gastro-esophageal reflux disease without esophagitis: Secondary | ICD-10-CM | POA: Diagnosis not present

## 2016-07-31 LAB — HEMOGLOBIN A1C
HEMOGLOBIN A1C: 7.7 % — AB (ref 4.8–5.6)
MEAN PLASMA GLUCOSE: 174 mg/dL

## 2016-07-31 NOTE — Plan of Care (Signed)
Problem: Safety: Goal: Ability to remain free from injury will improve Outcome: Progressing Pt's exit alarm is activated. Pt is encouraged to call for assistance with activity.

## 2016-07-31 NOTE — Care Management Obs Status (Signed)
[facility] NOTIFICATION   Patient Details  Name: Matthew Miles. MRN: [id number] Date of Birth: [birth year]   Medicare Observation Status Notification Given:  No (Discharge order in less than  24 hours)    Ival Bible, RN 07/31/2016, 12:22 PM

## 2016-07-31 NOTE — Consult Note (Signed)
CARDIOLOGY CONSULT NOTE     Primary Care Physician: [name], MD Referring Physician: Demetrios Loll  Admit Date: 07/30/2016  Reason for consultation: chest pain  Matthew Miles. is a 83 y.o. male who is being seen today for the evaluation of chest pain at the request of Demetrios Loll.  Algernon Mundie. is a 83 y.o. male with a h/o COPD, coronary artery disease, hypertension presented to the emergency room yesterday with chest pain. His complaint was actually of chest indigestion. Denies nausea or vomiting. He did take a nitroglycerin without improvement. Labs showed troponins negative 3. EKG without acute ischemic changes. His pain began yesterday morning when he woke up from sleep. Continued through the evening. The pain is not associated with exertion and not improved with rest. His pain was improved with belching multiple times. His pain was not similar to his prior pain related to his coronary disease, but it was similar to his discomfort due to history of reflux.    Today, he denies symptoms of palpitations, chest pain, shortness of breath, orthopnea, PND, lower extremity edema, dizziness, presyncope, syncope, or neurologic sequela. The patient is tolerating medications without difficulties and is otherwise without complaint today.   Past Medical History:  Diagnosis Date  . Allergy   . Arthritis   . CHF (congestive heart failure) ([place])   . Chicken pox   . Cholecystitis   . Colon polyps   . COPD (chronic obstructive pulmonary disease) ([place])   . Coronary artery disease   . Emphysema of lung ([place])   . GERD (gastroesophageal reflux disease)   . Heart murmur   . Hematemesis/vomiting blood 04/10/11  . Hypercholesterolemia   . Mild hypertension   . Pancreatitis   . Prostate cancer ([place])   . Prostate cancer [facility])    radiation therapy   . Smoker   . Ulcer   . Upper GI bleed 1980   Past Surgical History:  Procedure Laterality Date  . APPENDECTOMY    . CARDIAC  CATHETERIZATION  May 2002 and Feb 2013   [facility]; no stents   . CATARACT EXTRACTION    . CHOLECYSTECTOMY    . CIRCUMCISION    . COLONOSCOPY    . HEMORRHOID SURGERY    . SP CHOLECYSTOMY    . STOMACH SURGERY     bleeding ulcers, followed by Dr. [name]    . aspirin EC  325 mg Oral Daily  . carvedilol  3.125 mg Oral BID  . enoxaparin (LOVENOX) injection  40 mg Subcutaneous Q24H  . loratadine  10 mg Oral Daily  . pantoprazole  40 mg Oral BID AC  . pravastatin  40 mg Oral QPM  . vitamin B-12   Oral BID     Allergies  Allergen Reactions  . Sulfa Antibiotics     GI upset  . Tradjenta [Linagliptin] Other (See Comments)    Hair loss    Social History   Social History  . Marital status: Married    Spouse name: N/A  . Number of children: 2  . Years of education: N/A   Occupational History  . 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  [employer]   Social History Main Topics  . Smoking status: Former Smoker    Packs/day: 1.00    Years: 65.00    Types: Cigarettes    Quit date: 04/10/2011  . Smokeless tobacco: Former Systems developer    Types: Chew  . Alcohol use Yes     Comment:  1 beer rarely  . Drug use: No  . Sexual activity: Not Currently   Other Topics Concern  . Not on file   Social History Narrative   Lives in [place] alone. Wife in nursing home. Has 2 children.      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 Prostate cancer Father   . Liver cancer Brother   . Prostate cancer Brother   . Emphysema Sister     smoker    ROS- All systems are reviewed and negative except as per the HPI above  Physical Exam: Telemetry: Vitals:   07/30/16 1600 07/30/16 1625 07/30/16 2037 07/31/16 0519  BP: (!) 113/47 120/66 (!) 123/47 (!) 97/51  Pulse: 79 80 84 80  Resp: 16 18 18 19   Temp:  97.7 F (36.5 C) 98.2 F (36.8 C) 97.6 F (36.4 C)  TempSrc:  Oral Oral Oral  SpO2: 94% 92% 97% 95%  Weight:       Height:        GEN- The patient is well appearing, alert and oriented x 3 today.   Head- normocephalic, atraumatic Eyes-  Sclera clear, conjunctiva pink Ears- hearing intact Oropharynx- clear Neck- supple, no JVP Lymph- no cervical lymphadenopathy Lungs- Clear to ausculation bilaterally, normal work of breathing Heart- Regular rate and rhythm, 2/6 systolic murmur at the base, no rubs or gallops, PMI not laterally displaced GI- soft, NT, ND, + BS Extremities- no clubbing, cyanosis, or edema MS- no significant deformity or atrophy Skin- no rash or lesion Psych- euthymic mood, full affect Neuro- strength and sensation are intact  FOY:DXAJO rhythm, left anterior fascicular block, anterior Q waves-personally reviewed  Labs:   Lab Results  Component Value Date   WBC 5.8 07/30/2016   HGB 12.3 (L) 07/30/2016   HCT 36.6 (L) 07/30/2016   MCV 86.8 07/30/2016   PLT 125 (L) 07/30/2016    Recent Labs Lab 07/30/16 1321  NA 140  K 4.0  CL 105  CO2 26  BUN 30*  CREATININE 1.42*  CALCIUM 9.2  GLUCOSE 107*   Lab Results  Component Value Date   CKTOTAL 14 (L) 08/09/2012   CKMB < 0.5 (L) 08/09/2012   TROPONINI <0.03 07/30/2016    Lab Results  Component Value Date   CHOL 135 08/05/2015   CHOL 131 01/22/2015   CHOL 125 10/21/2014   Lab Results  Component Value Date   HDL 35.10 (L) 08/05/2015   HDL 32.20 (L) 01/22/2015   HDL 31.00 (L) 10/21/2014   Lab Results  Component Value Date   LDLCALC 68 01/22/2015   LDLCALC 54 10/21/2014   LDLCALC 74 04/21/2014   Lab Results  Component Value Date   TRIG 310.0 (H) 08/05/2015   TRIG 154.0 (H) 01/22/2015   TRIG 200.0 (H) 10/21/2014   Lab Results  Component Value Date   CHOLHDL 4 08/05/2015   CHOLHDL 4 01/22/2015   CHOLHDL 4 10/21/2014   Lab Results  Component Value Date   LDLDIRECT 80.0 08/05/2015   LDLDIRECT 79.0 07/21/2014      Radiology: No acute cardiopulmonary disease. Personally reviewed   Echo 2015: -  Left ventricle: The cavity size was normal. There was mild concentric hypertrophy. Systolic function was normal. The estimated ejection fraction was in the range of 55% to 60%. Wall motion was normal; there were no regional wall motion  abnormalities. Doppler parameters are consistent with abnormal left ventricular relaxation (grade 1 diastolic dysfunction). - Aortic valve: There was mild regurgitation. - Mitral valve: There was mild regurgitation. - Left atrium: The atrium was mildly dilated. - Right ventricle: Systolic function was normal. - Pulmonary arteries: Systolic pressure was midlly elevated. PA peak pressure: 37 mm Hg (S).  ASSESSMENT AND PLAN:   1. Chest pain: Negative EKG for ischemia, with negative troponins. Per his history, he does not have pain with exertion. His pain is improved with belching. He also says that his pain is similar to the pain that he gets when he has heartburn. At this time, it is unlikely that this is cardiac in nature. No further cardiac workup is necessary.  2. Hypertension: Well-controlled today.  3. Coronary artery disease: Troponins are negative and no current chest pain. No further workup.   Danikah Budzik Meredith Leeds, MD 07/31/2016  9:14 AM

## 2016-08-01 ENCOUNTER — Ambulatory Visit: Payer: PPO | Admitting: Pharmacist

## 2016-08-01 NOTE — Discharge Summary (Signed)
[facility] [place] NAME: Matthew Miles    MR#:  [id number]  DATE OF BIRTH:  March 17, 1929  DATE OF ADMISSION:  07/30/2016 ADMITTING PHYSICIAN: Demetrios Loll, MD  DATE OF DISCHARGE: 07/31/2016  2:23 PM  PRIMARY CARE PHYSICIAN: [name], MD   ADMISSION DIAGNOSIS:  Chest pain, unspecified type [R07.9]  DISCHARGE DIAGNOSIS:  Active Problems:   Chest pain   SECONDARY DIAGNOSIS:   Past Medical History:  Diagnosis Date  . Allergy   . Arthritis   . CHF (congestive heart failure) ([place])   . Chicken pox   . Cholecystitis   . Colon polyps   . COPD (chronic obstructive pulmonary disease) ([place])   . Coronary artery disease   . Emphysema of lung ([place])   . GERD (gastroesophageal reflux disease)   . Heart murmur   . Hematemesis/vomiting blood 04/10/11  . Hypercholesterolemia   . Mild hypertension   . Pancreatitis   . Prostate cancer ([place])   . Prostate cancer [facility])    radiation therapy   . Smoker   . Ulcer   . Upper GI bleed 1980     ADMITTING HISTORY  HISTORY OF PRESENT ILLNESS:  Matthew Miles  is a 83 y.o. male with a known history of HTN, CAD, CHF, COPD, GERD and prostate cancer. He complained of chest indigestion for the whole am, denies any nausea or vomiting.He took nitroglycerin without improvement. ED physician ask for observation.   HOSPITAL COURSE:   * Chest pain most likely due to heartburn. This is very similar to prior episodes of GERD which improved with burping. Patient was monitored on telemetry overnight. Troponin checked 3 times was normal. No arrhythmias on telemetry. EKG remained unchanged.  I discussed with Dr. Lennie Odor of cardiology who I discussed the patient with you. Patient is being discharged home to follow-up as outpatient with his primary care physician and cardiology.  Patient was given instructions regarding GERD cannot lay down within 2 hours after eating. Avoid alcohol, smoking, spicy food.  Continue  PPI.  Stable for discharge home.  CONSULTS OBTAINED:  Treatment Team:  Minna Merritts, MD Will Meredith Leeds, MD  DRUG ALLERGIES:   Allergies  Allergen Reactions  . Sulfa Antibiotics     GI upset  . Tradjenta [Linagliptin] Other (See Comments)    Hair loss    DISCHARGE MEDICATIONS:   Discharge Medication List as of 07/31/2016  1:07 PM    CONTINUE these medications which have NOT CHANGED   Details  albuterol (PROVENTIL HFA;VENTOLIN HFA) 108 (90 Base) MCG/ACT inhaler Inhale 2 puffs into the lungs every 6 (six) hours as needed for wheezing or shortness of breath., Starting Mon 07/18/2016, Normal    aspirin 81 MG tablet Take 81 mg by mouth daily., Historical Med    carvedilol (COREG) 3.125 MG tablet Take 1 tablet (3.125 mg total) by mouth 2 (two) times daily., Starting Thu 06/30/2016, Normal    Cyanocobalamin (B-12) 50 MCG TABS Take 1 tablet by mouth 2 (two) times daily. , Historical Med    cyclobenzaprine (FLEXERIL) 5 MG tablet Take 1 tablet (5 mg total) by mouth 3 (three) times daily as needed for muscle spasms., Starting Tue 10/08/2013, Normal    glipiZIDE (GLUCOTROL XL) 10 MG 24 hr tablet Take 1 tablet (10 mg total) by mouth 2 (two) times daily., Starting Thu 06/30/2016, Normal    Ipratropium-Albuterol (COMBIVENT RESPIMAT) 20-100 MCG/ACT AERS respimat Inhale 1 puff into the lungs 2 (two) times  daily., Historical Med    loratadine (CLARITIN) 10 MG tablet Take 1 tablet (10 mg total) by mouth daily., Starting Wed 02/03/2016, Normal    metFORMIN (GLUCOPHAGE XR) 500 MG 24 hr tablet Take 2 tablets (1,000 mg total) by mouth 2 (two) times daily., Starting Mon 07/04/2016, Normal    nitroGLYCERIN (NITROSTAT) 0.4 MG SL tablet Place 1 tablet (0.4 mg total) under the tongue every 5 (five) minutes as needed., Starting Mon 07/04/2016, Normal    omeprazole (PRILOSEC) 20 MG capsule Take 1 capsule (20 mg total) by mouth 2 (two) times daily before a meal., Starting Wed 08/05/2015, Normal     potassium chloride SA (K-DUR,KLOR-CON) 20 MEQ tablet Take 1 tablet (20 mEq total) by mouth daily., Starting Wed 03/30/2016, Normal    pravastatin (PRAVACHOL) 40 MG tablet Take 1 tablet (40 mg total) by mouth daily., Starting Thu 04/30/2015, Normal    sitaGLIPtin (JANUVIA) 50 MG tablet Take 1 tablet (50 mg total) by mouth daily., Starting Wed 02/03/2016, Normal    torsemide (DEMADEX) 10 MG tablet Take 1 tablet (10 mg total) by mouth 2 (two) times daily as needed., Starting Thu 06/30/2016, Until Thu 07/06/2017, Normal    Blood Glucose Monitoring Suppl W/DEVICE KIT Accu-chek Aviva Plus. Use as directed to check blood sugar two times a day. Dx. E11.65, Normal    glucose blood test strip One touch  berio  test strips. Use to check blood sugar two times a day. Dx. E11.9, Normal        Today   VITAL SIGNS:  Blood pressure (!) 131/52, pulse 88, temperature 97.6 F (36.4 C), temperature source Oral, resp. rate 19, height _0  (1.575 m), weight 81.6 kg (180 lb), SpO2 96 %.  I/O:   Intake/Output Summary (Last 24 hours) at 08/01/16 1348 Last data filed at 07/31/16 1410  Gross per 24 hour  Intake              240 ml  Output                0 ml  Net              240 ml    PHYSICAL EXAMINATION:  Physical Exam  GENERAL:  83 y.o.-year-old patient lying in the bed with no acute distress.  LUNGS: Normal breath sounds bilaterally, no wheezing, rales,rhonchi or crepitation. No use of accessory muscles of respiration.  CARDIOVASCULAR: S1, S2 normal. No murmurs, rubs, or gallops.  ABDOMEN: Soft, non-tender, non-distended. Bowel sounds present. No organomegaly or mass.  NEUROLOGIC: Moves all 4 extremities. PSYCHIATRIC: The patient is alert and oriented x 3.  SKIN: No obvious rash, lesion, or ulcer.   DATA REVIEW:   CBC  Recent Labs Lab 07/30/16 1321  WBC 5.8  HGB 12.3*  HCT 36.6*  PLT 125*    Chemistries   Recent Labs Lab 07/30/16 1321  NA 140  K 4.0  CL 105  CO2 26  GLUCOSE  107*  BUN 30*  CREATININE 1.42*  CALCIUM 9.2    Cardiac Enzymes  Recent Labs Lab 07/30/16 1922  TROPONINI <0.03    Microbiology Results  Results for orders placed or performed in visit on 06/06/16  Microscopic Examination     Status: Abnormal   Collection Time: 06/06/16  2:26 PM  Result Value Ref Range Status   WBC, UA 11-30 (A) 0 - 5 /hpf Final   RBC, UA None seen 0 - 2 /hpf Final   Epithelial Cells (non renal) 0-10  0 - 10 /hpf Final   Casts Present (A) None seen /lpf Final   Cast Type Hyaline casts N/A Final   Bacteria, UA None seen None seen/Few Final    RADIOLOGY:  No results found.  Follow up with PCP in 1 week.  Management plans discussed with the patient, family and they are in agreement.  CODE STATUS:  Code Status History    Date Active Date Inactive Code Status Order ID Comments User Context   07/30/2016  4:25 PM 07/31/2016  5:28 PM Full Code [id number]  Demetrios Loll, MD Inpatient    Advance Directive Documentation     Most Recent Value  Type of Advance Directive  Healthcare Power of Attorney  Pre-existing out of facility DNR order (yellow form or pink MOST form)  -  "MOST" Form in Place?  -      TOTAL TIME TAKING CARE OF THIS PATIENT ON DAY OF DISCHARGE: more than 30 minutes.   Hillary Bow R M.D on 08/01/2016 at 1:48 PM  Between 7am to 6pm - Pager - [phone]  After 6pm go to [email or web address] - password [email or web address] [email or web address]  Office  [phone]  CC: Primary care physician; [name], MD  Note: This dictation was prepared with Dragon dictation along with smaller phrase technology. Any transcriptional errors that result from this process are unintentional.

## 2016-08-02 ENCOUNTER — Telehealth: Payer: Self-pay | Admitting: Family Medicine

## 2016-08-02 NOTE — Telephone Encounter (Signed)
Patient already scheduled for follow up kept same appointment since 30 minutes for HFU.

## 2016-08-02 NOTE — Telephone Encounter (Signed)
Transition Care Management Follow-up Telephone Call  How have you been since you were released from the hospital? Doing pretty good no more chest pain. "  Do you understand why you were in the hospital? No , just kept having indigestion don't know why they kept me."   Do you understand the discharge instrcutions? Yes,  Items Reviewed:  Medications reviewed: Yes, medication reviewed patient uses a weekly pill box for day and night medications.  Allergies reviewed: Yes,  Dietary changes reviewed: Yes,   Referrals reviewed: yes , referral with [name] May 1st, 2018.   Functional Questionnaire:   Activities of Daily Living (ADLs):   He states they are independent in the following:All Adl's States they require assistance with the following: No, assist needed at this time   Any transportation issues/concerns?: No   Any patient concerns? Yes, about medications patient has written questions for appointment.   Confirmed importance and date/time of follow-up visits scheduled: Yes.   Confirmed with patient if condition begins to worsen call PCP or go to the ER.  Patient was given the Call-a-Nurse line [phone]: Yes

## 2016-08-05 ENCOUNTER — Ambulatory Visit (INDEPENDENT_AMBULATORY_CARE_PROVIDER_SITE_OTHER): Payer: PPO | Admitting: Family Medicine

## 2016-08-05 ENCOUNTER — Encounter: Payer: Self-pay | Admitting: Family Medicine

## 2016-08-05 VITALS — BP 144/60 | HR 79 | Temp 97.9°F | Wt 190.6 lb

## 2016-08-05 DIAGNOSIS — J449 Chronic obstructive pulmonary disease, unspecified: Secondary | ICD-10-CM

## 2016-08-05 DIAGNOSIS — R079 Chest pain, unspecified: Secondary | ICD-10-CM

## 2016-08-05 DIAGNOSIS — K219 Gastro-esophageal reflux disease without esophagitis: Secondary | ICD-10-CM | POA: Diagnosis not present

## 2016-08-05 DIAGNOSIS — W19XXXA Unspecified fall, initial encounter: Secondary | ICD-10-CM | POA: Diagnosis not present

## 2016-08-05 MED ORDER — PANTOPRAZOLE SODIUM 40 MG PO TBEC: 40.0000 mg | DELAYED_RELEASE_TABLET | Freq: Every day | ORAL | 3 refills | Status: DC

## 2016-08-05 NOTE — Assessment & Plan Note (Signed)
Patient with a couple of recent falls. No head injury. Was evaluated at [facility] walk-in clinic regarding a hand injury from this. Appears to be healing well. Discussed that it may take some time for the hematoma to resolve completely. We discussed getting him to do physical therapy given several falls though he wants to try exercising on his own. We'll see how he does with this over the next month. Advised that they could call us to let us know if it would like to do physical therapy down the line.

## 2016-08-05 NOTE — Assessment & Plan Note (Signed)
Has improved to some degree with Spiriva. Patient was somewhat unsure what other inhalers he has at home and it sounds as though he might have 2 other types of inhalers at home. He or his daughter will contact us and let us know what the names of the inhalers are. We'll refer back to pulmonology.

## 2016-08-05 NOTE — Assessment & Plan Note (Signed)
Suspect patient's chest pain was related to GERD. He had negative workup in the hospital. Evaluated by cardiology and has follow-up with them next week. Sounds as though he's been taking an increased number of Tums recently. We will change him to Protonix. He'll discontinue Prilosec. He will follow-up in one month and if not improved at that time would need to consider referral to GI.

## 2016-08-05 NOTE — Progress Notes (Signed)
Pre visit review using our clinic review tool, if applicable. No additional management support is needed unless otherwise documented below in the visit note. 

## 2016-08-05 NOTE — Progress Notes (Signed)
[name], MD Phone: [phone]  Matthew Miles. is a 83 y.o. male who presents today for hospital follow-up.  Patient was hospitalized for chest pain rule out from 07/30/16-07/31/16. Had had persistent chest pain after waking up. Notes it was in the center of his chest. Felt like his reflux. Typically in the past he would burp and will go away though that was not occurring this time. He was burping a lot. No shortness of breath. No radiation. He had negative troponins and a negative EKG. He did take a nitroglycerin prior to going in the hospital and this did not help. He had been taking an increased amount of Tums previously. Has been taking Prilosec as well. Rare NSAID use. Discharge summary was reviewed. Medications were reviewed in full with the patient.  COPD: Some shortness of breath at times. Minimal cough. No wheezing. Notes symptoms have improved somewhat with Spiriva. Rarely uses albuterol. Has not seen pulmonology in a long time.  He reports he also fell and hit his right hand. He developed swelling and had a knot on the back of it. No pain. Reports he had x-rays that were negative at [facility] clinic. Reports his left knee gave out on him. He has had no knee pain. Has not given out on him since.  PMH: Former smoker   ROS see history of present illness  Objective  Physical Exam Vitals:   08/05/16 1338 08/05/16 1404  BP: (!) 124/50 (!) 144/60  Pulse: 79   Temp: 97.9 F (36.6 C)     BP Readings from Last 3 Encounters:  08/05/16 (!) 144/60  07/31/16 (!) 131/52  07/18/16 129/64   Wt Readings from Last 3 Encounters:  08/05/16 190 lb 9.6 oz (86.5 kg)  07/30/16 180 lb (81.6 kg)  07/18/16 193 lb (87.5 kg)    Physical Exam  Constitutional: No distress.  HENT:  Head: Normocephalic and atraumatic.  Mouth/Throat: Oropharynx is clear and moist. No oropharyngeal exudate.  Eyes: Conjunctivae are normal. Pupils are equal, round, and reactive to light.  Cardiovascular:  Normal rate, regular rhythm and normal heart sounds.   Pulmonary/Chest: Effort normal and breath sounds normal.  Abdominal: Soft. Bowel sounds are normal. He exhibits no distension. There is no tenderness. There is no rebound and no guarding.  Musculoskeletal: He exhibits no edema.  Right dorsum of hand with small nodule noted likely representing a hematoma, nontender, no surrounding erythema  Neurological: He is alert.  CN 2-12 intact, 5/5 strength in bilateral biceps, triceps, grip, quads, hamstrings, plantar and dorsiflexion, sensation to light touch intact in bilateral UE and LE, normal gait  Skin: Skin is warm and dry. He is not diaphoretic.     Assessment/Plan: Please see individual problem list.  GERD (gastroesophageal reflux disease) Suspect patient's chest pain was related to GERD. He had negative workup in the hospital. Evaluated by cardiology and has follow-up with them next week. Sounds as though he's been taking an increased number of Tums recently. We will change him to Protonix. He'll discontinue Prilosec. He will follow-up in one month and if not improved at that time would need to consider referral to GI.  COPD (chronic obstructive pulmonary disease) ([place]) Has improved to some degree with Spiriva. Patient was somewhat unsure what other inhalers he has at home and it sounds as though he might have 2 other types of inhalers at home. He or his daughter will contact us and let us know what the names of the inhalers are. We'll refer  back to pulmonology.  Chest pain Chest pain likely related to reflux. Negative cardiac workup in the hospital. He'll follow-up with cardiology next week. See GERD for plan regarding reflux.  Falls Patient with a couple of recent falls. No head injury. Was evaluated at [facility] walk-in clinic regarding a hand injury from this. Appears to be healing well. Discussed that it may take some time for the hematoma to resolve completely. We discussed getting him  to do physical therapy given several falls though he wants to try exercising on his own. We'll see how he does with this over the next month. Advised that they could call us to let us know if it would like to do physical therapy down the line.   Orders Placed This Encounter  Procedures  . Ambulatory referral to Pulmonology    Referral Priority:   Routine    Referral Type:   Consultation    Referral Reason:   Specialty Services Required    Requested Specialty:   Pulmonary Disease    Number of Visits Requested:   1    Meds ordered this encounter  Medications  . pantoprazole (PROTONIX) 40 MG tablet    Sig: Take 1 tablet (40 mg total) by mouth daily.    Dispense:  30 tablet    Refill:  [place], MD [place]

## 2016-08-05 NOTE — Patient Instructions (Signed)
Nice to see you. We'll get you to see pulmonology again. Please keep your appointment with cardiology. Please monitor the area on your right hand. It should continue to improve. We will change your Prilosec to Protonix. Please discontinue the Prilosec.

## 2016-08-05 NOTE — Assessment & Plan Note (Signed)
Chest pain likely related to reflux. Negative cardiac workup in the hospital. He'll follow-up with cardiology next week. See GERD for plan regarding reflux.

## 2016-08-07 NOTE — Progress Notes (Signed)
Cardiology Office Note  Date:  08/09/2016   ID:  Matthew Miles., DOB 04/02/1929, MRN [id number]  PCP:  [name], MD   Chief Complaint  Patient presents with  . other    No complaints today denies chest pain today. Meds reviewed verbally with pt.    HPI:  [name] is a 83 yo-old gentleman with history of  DM II, HBA1C 7.5 coronary artery disease (occluded LAD),  moderate pulmonary hypertension in early 2013,  long history of smoking for 60 years,  bronchitis requiring several courses of antibiotics at the end of 2012,  December for hematemesis and gastric ulcer seen on EGD,  nonsustained VT per the notes,  acute renal failure who presents for followup Of his coronary artery disease Ejection fraction 55% in 2015  He reports that he was recently In the hospital, 07/30/2016 GERD symptoms Ate cabbage that day Better with GI cocktail,Ruled out for [facility] records reviewed with the patient in detail Changed to protonix At discharge    No further symptoms since he has been home   legs to be getting weaker, more falls He declined physical therapy  Takes torsemide 20 in the AM, 10 in the PM Has chronic mild leg edema, nonpitting Feels that symptoms are stable   Lab work reviewed with him Total chol 130s, LDL 80  No exercise,sedentary No angina sx,  stopped smoking >5 years ago  59 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, 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  EKG personally reviewed by myself on todays visit Shows normal sinus rhythm with rate 98 bpm , old anterior MI, no significant ST or T-wave changes, left anterior fascicular block   Other past medical history Chronic low back pain Reports that he had a bleeding ulcer 4 years ago at that time stop smoking Last echocardiogram in 2015 showing normal ejection fraction, right heart pressures 37 mmHg  left and right heart cath done in February 2013 showing pulmonary hypertension, stable severe coronary artery disease  Normal ejection  fraction  Right heart catheterization showed significant fluid overload with wedge pressure of 24, moderate pulmonary hypertension. Catheterization showed occluded LAD which was old, no other significant stenoses requiring intervention.   PMH:   has a past medical history of Allergy; Arthritis; CHF (congestive heart failure) ([place]); Chicken pox; Cholecystitis; Colon polyps; COPD (chronic obstructive pulmonary disease) ([place]); Coronary artery disease; Emphysema of lung ([place]); GERD (gastroesophageal reflux disease); Heart murmur; Hematemesis/vomiting blood (04/10/11); Hypercholesterolemia; Mild hypertension; Pancreatitis; Prostate cancer ([place]); Prostate cancer ([place]); Smoker; Ulcer; and Upper GI bleed (1980).  PSH:    Past Surgical History:  Procedure Laterality Date  . APPENDECTOMY    . CARDIAC CATHETERIZATION  May 2002 and Feb 2013   [facility]; no stents   . CATARACT EXTRACTION    . CHOLECYSTECTOMY    . CIRCUMCISION    . COLONOSCOPY    . HEMORRHOID SURGERY    . SP CHOLECYSTOMY    . STOMACH SURGERY     bleeding ulcers, followed by Dr. [name]    Current Outpatient Prescriptions  Medication Sig Dispense Refill  . albuterol (PROVENTIL HFA;VENTOLIN HFA) 108 (90 Base) MCG/ACT inhaler Inhale 2 puffs into the lungs every 6 (six) hours as needed for wheezing or shortness of breath. 1 Inhaler 2  . aspirin 81 MG tablet Take 81 mg by mouth 2 (two) times daily.     . carvedilol (COREG) 3.125 MG tablet Take 1 tablet (3.125 mg total) by mouth 2 (two) times daily. 180 tablet 3  .  Cyanocobalamin (B-12) 1000 MCG TBCR Take 1 tablet by mouth daily.     Marland Kitchen glipiZIDE (GLUCOTROL XL) 10 MG 24 hr tablet Take 1 tablet (10 mg total) by mouth 2 (two) times daily. 180 tablet 3  . glucose blood test strip One touch  berio  test strips. Use to check blood sugar two times a day. Dx. E11.9 (Patient taking differently: One touch  verio  test strips. Use to check blood sugar two times a day. Dx. E11.9) 200 each 11  .  Ipratropium-Albuterol (COMBIVENT RESPIMAT) 20-100 MCG/ACT AERS respimat Inhale 1 puff into the lungs 2 (two) times daily.    . metFORMIN (GLUCOPHAGE XR) 500 MG 24 hr tablet Take 2 tablets (1,000 mg total) by mouth 2 (two) times daily. 360 tablet 1  . nitroGLYCERIN (NITROSTAT) 0.4 MG SL tablet Place 1 tablet (0.4 mg total) under the tongue every 5 (five) minutes as needed. 25 tablet 0  . pantoprazole (PROTONIX) 40 MG tablet Take 1 tablet (40 mg total) by mouth daily. 30 tablet 3  . potassium chloride SA (K-DUR,KLOR-CON) 20 MEQ tablet Take 1 tablet (20 mEq total) by mouth daily. 90 tablet 3  . pravastatin (PRAVACHOL) 40 MG tablet Take 1 tablet (40 mg total) by mouth daily. 90 tablet 3  . sitaGLIPtin (JANUVIA) 50 MG tablet Take 1 tablet (50 mg total) by mouth daily. 90 tablet 3  . torsemide (DEMADEX) 10 MG tablet Take 1 tablet (10 mg total) by mouth 2 (two) times daily as needed. (Patient taking differently: Take 10-20 mg by mouth 2 (two) times daily. 20 mg every morning and 10 mg every evening (skips on Sundays mornings).) 180 tablet 3   No current facility-administered medications for this visit.      Allergies:   Sulfa antibiotics and Tradjenta [linagliptin]   Social History:  The patient  reports that he quit smoking about 5 years ago. His smoking use included Cigarettes. He has a 65.00 pack-year smoking history. He has quit using smokeless tobacco. His smokeless tobacco use included Chew. He reports that he drinks alcohol. He reports that he does not use drugs.   Family History:   family history includes Emphysema in his sister; Liver cancer in his brother; Prostate cancer in his brother and father.    Review of Systems: Review of Systems  Constitutional: Negative.   Respiratory: Negative.   Cardiovascular: Positive for chest pain.  Gastrointestinal: Positive for heartburn.  Musculoskeletal: Positive for falls and joint pain.  Neurological: Negative.   Psychiatric/Behavioral: Negative.    All other systems reviewed and are negative.    PHYSICAL EXAM: VS:  BP (!) 124/58 (BP Location: Left Arm, Patient Position: Sitting, Cuff Size: Normal)   Pulse 98   Ht 5\' 2"  (1.575 m)   Wt 189 lb 8 oz (86 kg)   BMI 34.66 kg/m  , BMI Body mass index is 34.66 kg/m. GEN: Well nourished, well developed, in no acute distress , obese HEENT: normal  Neck: no JVD, carotid bruits, or masses Cardiac: RRR; no murmurs, rubs, or gallops,trace nonpitting edema b/l  Respiratory:  clear to auscultation bilaterally, normal work of breathing GI: soft, nontender, nondistended, + BS MS: no deformity or atrophy  Skin: warm and dry, no rash Neuro:  Strength and sensation are intact Psych: euthymic mood, full affect    Recent Labs: 05/06/2016: ALT 14 07/30/2016: BUN 30; Creatinine, Ser 1.42; Hemoglobin 12.3; Platelets 125; Potassium 4.0; Sodium 140    Lipid Panel Lab Results  Component Value  Date   CHOL 135 08/05/2015   HDL 35.10 (L) 08/05/2015   LDLCALC 68 01/22/2015   TRIG 310.0 (H) 08/05/2015      Wt Readings from Last 3 Encounters:  08/09/16 189 lb 8 oz (86 kg)  08/05/16 190 lb 9.6 oz (86.5 kg)  07/30/16 180 lb (81.6 kg)       ASSESSMENT AND PLAN:   Pulmonary hypertension ([place]) - Plan: EKG 12-Lead Last echo 10/2013 Takes torsemide, stable trace leg edema  Chronic diastolic CHF (congestive heart failure) ([place]) - Plan: EKG 12-Lead Euvolemic, torsemide 20 mg daily in the am and 10 mg at night Potassium stable  Essential hypertension - Plan: EKG 12-Lead Blood pressure is well controlled on today's visit. No changes made to the medications.  Coronary artery disease involving native coronary artery of native heart without angina pectoris - Plan: EKG 12-Lead Currently with no symptoms of angina. No further workup at this time. Continue current medication regimen.  Mixed hyperlipidemia - Plan: EKG 12-Lead Cholesterol is at goal on the current lipid regimen. No changes to the  medications were made.  Uncontrolled type 2 diabetes mellitus with other circulatory complication, without long-term current use of insulin ([place]) - We have encouraged continued exercise, careful diet management in an effort to lose weight.   Chronic obstructive pulmonary disease, unspecified COPD type ([place]) - Plan: EKG 12-Lead Stable , no bronchitis   GERD Most of today's visit was spent discussing the foods to avoid, various medications Recent symptoms on hospitalization Changes he can make to his bed, etc.   Total encounter time more than 25 minutes  Greater than 50% was spent in counseling and coordination of care with the patient   Disposition:   F/U  6 months   Orders Placed This Encounter  Procedures  . EKG 12-Lead     Signed, Esmond Plants, M.D., Ph.D. 08/09/2016  [place], [place]

## 2016-08-09 ENCOUNTER — Encounter: Payer: Self-pay | Admitting: Cardiovascular Disease

## 2016-08-09 ENCOUNTER — Ambulatory Visit (INDEPENDENT_AMBULATORY_CARE_PROVIDER_SITE_OTHER): Payer: PPO | Admitting: Cardiovascular Disease

## 2016-08-09 VITALS — BP 124/58 | HR 98 | Ht 62.0 in | Wt 189.5 lb

## 2016-08-09 DIAGNOSIS — I10 Essential (primary) hypertension: Secondary | ICD-10-CM | POA: Diagnosis not present

## 2016-08-09 DIAGNOSIS — E1159 Type 2 diabetes mellitus with other circulatory complications: Secondary | ICD-10-CM | POA: Diagnosis not present

## 2016-08-09 DIAGNOSIS — K219 Gastro-esophageal reflux disease without esophagitis: Secondary | ICD-10-CM

## 2016-08-09 DIAGNOSIS — IMO0002 Reserved for concepts with insufficient information to code with codable children: Secondary | ICD-10-CM

## 2016-08-09 DIAGNOSIS — J432 Centrilobular emphysema: Secondary | ICD-10-CM

## 2016-08-09 DIAGNOSIS — E1165 Type 2 diabetes mellitus with hyperglycemia: Secondary | ICD-10-CM | POA: Diagnosis not present

## 2016-08-09 DIAGNOSIS — I2510 Atherosclerotic heart disease of native coronary artery without angina pectoris: Secondary | ICD-10-CM | POA: Diagnosis not present

## 2016-08-09 DIAGNOSIS — I272 Pulmonary hypertension, unspecified: Secondary | ICD-10-CM

## 2016-08-09 DIAGNOSIS — I5032 Chronic diastolic (congestive) heart failure: Secondary | ICD-10-CM

## 2016-08-09 NOTE — Patient Instructions (Addendum)
Medication Instructions:   No medication changes made  Try ranitidine or famotidine (zantac or pepcid) Take 1 to 2 with tums   Labwork:  No new labs needed  Testing/Procedures:  No further testing at this time   I recommend watching educational videos on topics of interest to you at:       [email or web address]  Enter code: HEARTCARE    Follow-Up: It was a pleasure seeing you in the office today. Please call us if you have new issues that need to be addressed before your next appt.  [phone]  Your physician wants you to follow-up in: 6 months.  You will receive a reminder letter in the mail two months in advance. If you don't receive a letter, please call our office to schedule the follow-up appointment.  If you need a refill on your cardiac medications before your next appointment, please call your pharmacy.

## 2016-08-21 NOTE — Progress Notes (Signed)
[facility]  Telephone:([phone] [phone]  ID: [name] OB: 05-09-1928  MR#: [id number]  DBZ#:[id number]  Patient Care Team: [name], MD as PCP - General (Family Medicine) Minna Merritts, MD as Consulting Physician (Cardiology)  CHIEF COMPLAINT: Thrombocytopenia.  INTERVAL HISTORY: Patient returns to clinic today for repeat laboratory work and further evaluation. He continues to feel well and is asymptomatic.  He has no neurologic complaints. He denies any easy bleeding or bruising.  He has a good appetite and denies weight loss.  He denies any fevers or illnesses.  He has no chest pain or shortness of breath.  He denies any nausea, vomiting, constipation, or diarrhea.  He has no urinary complaints.  Patient offers no specific complaints today.  REVIEW OF SYSTEMS:   Review of Systems  Constitutional: Negative for fever, malaise/fatigue and weight loss.  Respiratory: Negative.  Negative for cough, hemoptysis and shortness of breath.   Cardiovascular: Negative.  Negative for chest pain and leg swelling.  Gastrointestinal: Negative.  Negative for blood in stool and melena.  Genitourinary: Negative.   Musculoskeletal: Negative.   Skin: Negative.  Negative for rash.  Neurological: Negative.  Negative for weakness.  Endo/Heme/Allergies: Does not bruise/bleed easily.  Psychiatric/Behavioral: Negative.  The patient is not nervous/anxious.     As per HPI. Otherwise, a complete review of systems is negative.  PAST MEDICAL HISTORY: Past Medical History:  Diagnosis Date  . Allergy   . Arthritis   . CHF (congestive heart failure) ([place])   . Chicken pox   . Cholecystitis   . Colon polyps   . COPD (chronic obstructive pulmonary disease) ([place])   . Coronary artery disease   . Emphysema of lung ([place])   . GERD (gastroesophageal reflux disease)   . Heart murmur   . Hematemesis/vomiting blood 04/10/11  . Hypercholesterolemia   . Mild  hypertension   . Pancreatitis   . Prostate cancer ([place])   . Prostate cancer [facility])    radiation therapy   . Smoker   . Ulcer   . Upper GI bleed 1980    PAST SURGICAL HISTORY: Past Surgical History:  Procedure Laterality Date  . APPENDECTOMY    . CARDIAC CATHETERIZATION  May 2002 and Feb 2013   [facility]; no stents   . CATARACT EXTRACTION    . CHOLECYSTECTOMY    . CIRCUMCISION    . COLONOSCOPY    . HEMORRHOID SURGERY    . SP CHOLECYSTOMY    . STOMACH SURGERY     bleeding ulcers, followed by Dr. [name]    FAMILY HISTORY Family History  Problem Relation Age of Onset  . Prostate cancer Father   . Liver cancer Brother   . Prostate cancer Brother   . Emphysema Sister        smoker       ADVANCED DIRECTIVES:    HEALTH MAINTENANCE: Social History  Substance Use Topics  . Smoking status: Former Smoker    Packs/day: 1.00    Years: 65.00    Types: Cigarettes    Quit date: 04/10/2011  . Smokeless tobacco: Former Systems developer    Types: Chew  . Alcohol use Yes     Comment: 1 beer rarely     Colonoscopy:  PAP:  Bone density:  Lipid panel:  Allergies  Allergen Reactions  . Sulfa Antibiotics     GI upset  . Tradjenta [Linagliptin] Other (See Comments)    Hair loss    Current Outpatient  Prescriptions  Medication Sig Dispense Refill  . albuterol (PROVENTIL HFA;VENTOLIN HFA) 108 (90 Base) MCG/ACT inhaler Inhale 2 puffs into the lungs every 6 (six) hours as needed for wheezing or shortness of breath. 1 Inhaler 2  . aspirin 81 MG tablet Take 81 mg by mouth daily.     . carvedilol (COREG) 3.125 MG tablet Take 1 tablet (3.125 mg total) by mouth 2 (two) times daily. 180 tablet 3  . Cyanocobalamin (B-12) 1000 MCG TBCR Take 1 tablet by mouth daily.     Marland Kitchen glipiZIDE (GLUCOTROL XL) 10 MG 24 hr tablet Take 1 tablet (10 mg total) by mouth 2 (two) times daily. 180 tablet 3  . glucose blood test strip One touch  berio  test strips. Use to check blood sugar two times a day. Dx. E11.9  (Patient taking differently: One touch  verio  test strips. Use to check blood sugar two times a day. Dx. E11.9) 200 each 11  . metFORMIN (GLUCOPHAGE XR) 500 MG 24 hr tablet Take 2 tablets (1,000 mg total) by mouth 2 (two) times daily. 360 tablet 1  . nitroGLYCERIN (NITROSTAT) 0.4 MG SL tablet Place 1 tablet (0.4 mg total) under the tongue every 5 (five) minutes as needed. 25 tablet 0  . pantoprazole (PROTONIX) 40 MG tablet Take 1 tablet (40 mg total) by mouth daily. 30 tablet 3  . potassium chloride SA (K-DUR,KLOR-CON) 20 MEQ tablet Take 1 tablet (20 mEq total) by mouth daily. 90 tablet 3  . pravastatin (PRAVACHOL) 40 MG tablet Take 1 tablet (40 mg total) by mouth daily. 90 tablet 3  . sitaGLIPtin (JANUVIA) 50 MG tablet Take 1 tablet (50 mg total) by mouth daily. 90 tablet 3  . tiotropium (SPIRIVA HANDIHALER) 18 MCG inhalation capsule Place 1 capsule (18 mcg total) into inhaler and inhale daily. 30 capsule 12  . torsemide (DEMADEX) 10 MG tablet Take 1 tablet (10 mg total) by mouth 2 (two) times daily as needed. (Patient taking differently: Take 10-20 mg by mouth 2 (two) times daily. 20 mg every morning and 10 mg every evening (skips on Sundays mornings).) 180 tablet 3   No current facility-administered medications for this visit.     OBJECTIVE: Vitals:   08/23/16 1119  BP: (!) 156/71  Pulse: (!) 101  Resp: 18  Temp: (!) 96.5 F (35.8 C)     Body mass index is 34.18 kg/m.    ECOG FS:0 - Asymptomatic  General: Well-developed, well-nourished, no acute distress. Eyes: Pink conjunctiva, anicteric sclera. Lungs: Clear to auscultation bilaterally. Heart: Regular rate and rhythm. No rubs, murmurs, or gallops. Abdomen: Soft, nontender, nondistended. No organomegaly noted, normoactive bowel sounds. Musculoskeletal: No edema, cyanosis, or clubbing. Neuro: Alert, answering all questions appropriately. Cranial nerves grossly intact. Skin: No rashes or petechiae noted. Psych: Normal  affect.   LAB RESULTS:  Lab Results  Component Value Date   NA 140 07/30/2016   K 4.0 07/30/2016   CL 105 07/30/2016   CO2 26 07/30/2016   GLUCOSE 107 (H) 07/30/2016   BUN 30 (H) 07/30/2016   CREATININE 1.42 (H) 07/30/2016   CALCIUM 9.2 07/30/2016   PROT 6.5 05/06/2016   ALBUMIN 4.1 05/06/2016   AST 13 05/06/2016   ALT 14 05/06/2016   ALKPHOS 70 05/06/2016   BILITOT 0.5 05/06/2016   GFRNONAA 43 (L) 07/30/2016   GFRAA 50 (L) 07/30/2016    Lab Results  Component Value Date   WBC 6.2 08/23/2016   NEUTROABS 4.4 08/23/2016  HGB 11.8 (L) 08/23/2016   HCT 34.9 (L) 08/23/2016   MCV 86.1 08/23/2016   PLT 119 (L) 08/23/2016     STUDIES: Dg Chest 2 View  Result Date: 07/30/2016 CLINICAL DATA:  indigestion/CP which started this am- took a nitro today and pt also states burping. Former smoker. Hx of COPD, CHF, CAD, Emphysema, and heart murmur. EXAM: CHEST  2 VIEW COMPARISON:  08/10/2012 FINDINGS: The cardiac silhouette is normal in size. No mediastinal or hilar masses. No evidence of adenopathy. There are prominent bronchovascular markings bilaterally similar to the prior study. No evidence of pneumonia. No pulmonary edema. No pleural effusion or pneumothorax. Skeletal structures are demineralized but grossly intact. Bowel anastomosis staples are multiple surgical vascular clips are noted in the upper abdomen, stable. IMPRESSION: No acute cardiopulmonary disease. Electronically Signed   By: Lajean Manes M.D.   On: 07/30/2016 13:49    ASSESSMENT:  Thrombocytopenia.  PLAN:    1.  Thrombocytopenia: Patient's most recent platelet count of 119 continues to be decreased, but essentially unchanged from previous. Patient noted to have positive platelet antibody on repeat testing in January 2017. These can be transient nature and will consider repeating test in the future, but this is not necessary at this time. The remainder of his laboratory work was either negative or within normal  limits. No intervention is needed at this time.  Patient does not require bone marrow biopsy. Return to clinic in 6 months with repeat laboratory work and further evaluation.   2. Anemia: Mild, monitor. 3. Hypertension: Patient's blood pressure is significantly elevated today. Continue evaluation and treatment per primary care.  Patient expressed understanding and was in agreement with this plan. He also understands that He can call clinic at any time with any questions, concerns, or complaints.   Lloyd Huger, MD   08/23/2016 11:24 AM

## 2016-08-23 ENCOUNTER — Telehealth: Payer: Self-pay

## 2016-08-23 ENCOUNTER — Other Ambulatory Visit: Payer: Self-pay

## 2016-08-23 ENCOUNTER — Inpatient Hospital Stay: Payer: PPO | Attending: Oncology

## 2016-08-23 ENCOUNTER — Inpatient Hospital Stay (HOSPITAL_BASED_OUTPATIENT_CLINIC_OR_DEPARTMENT_OTHER): Payer: PPO | Admitting: Oncology

## 2016-08-23 VITALS — BP 156/71 | HR 101 | Temp 96.5°F | Resp 18 | Wt 186.9 lb

## 2016-08-23 DIAGNOSIS — D696 Thrombocytopenia, unspecified: Secondary | ICD-10-CM

## 2016-08-23 DIAGNOSIS — Z923 Personal history of irradiation: Secondary | ICD-10-CM

## 2016-08-23 DIAGNOSIS — Z8546 Personal history of malignant neoplasm of prostate: Secondary | ICD-10-CM | POA: Insufficient documentation

## 2016-08-23 DIAGNOSIS — Z7982 Long term (current) use of aspirin: Secondary | ICD-10-CM | POA: Insufficient documentation

## 2016-08-23 DIAGNOSIS — Z87891 Personal history of nicotine dependence: Secondary | ICD-10-CM | POA: Insufficient documentation

## 2016-08-23 DIAGNOSIS — I509 Heart failure, unspecified: Secondary | ICD-10-CM | POA: Insufficient documentation

## 2016-08-23 DIAGNOSIS — E7800 Pure hypercholesterolemia, unspecified: Secondary | ICD-10-CM | POA: Insufficient documentation

## 2016-08-23 DIAGNOSIS — D649 Anemia, unspecified: Secondary | ICD-10-CM

## 2016-08-23 DIAGNOSIS — K219 Gastro-esophageal reflux disease without esophagitis: Secondary | ICD-10-CM | POA: Insufficient documentation

## 2016-08-23 DIAGNOSIS — I10 Essential (primary) hypertension: Secondary | ICD-10-CM | POA: Insufficient documentation

## 2016-08-23 DIAGNOSIS — K859 Acute pancreatitis without necrosis or infection, unspecified: Secondary | ICD-10-CM | POA: Insufficient documentation

## 2016-08-23 DIAGNOSIS — J449 Chronic obstructive pulmonary disease, unspecified: Secondary | ICD-10-CM | POA: Diagnosis not present

## 2016-08-23 DIAGNOSIS — I2510 Atherosclerotic heart disease of native coronary artery without angina pectoris: Secondary | ICD-10-CM | POA: Insufficient documentation

## 2016-08-23 DIAGNOSIS — Z86010 Personal history of colonic polyps: Secondary | ICD-10-CM | POA: Insufficient documentation

## 2016-08-23 LAB — CBC WITH DIFFERENTIAL/PLATELET
Basophils Absolute: 0 10*3/uL (ref 0–0.1)
Basophils Relative: 1 %
Eosinophils Absolute: 0.1 10*3/uL (ref 0–0.7)
Eosinophils Relative: 2 %
HCT: 34.9 % — ABNORMAL LOW (ref 40.0–52.0)
Hemoglobin: 11.8 g/dL — ABNORMAL LOW (ref 13.0–18.0)
Lymphocytes Relative: 18 %
Lymphs Abs: 1.1 10*3/uL (ref 1.0–3.6)
MCH: 29.3 pg (ref 26.0–34.0)
MCHC: 34 g/dL (ref 32.0–36.0)
MCV: 86.1 fL (ref 80.0–100.0)
Monocytes Absolute: 0.6 10*3/uL (ref 0.2–1.0)
Monocytes Relative: 9 %
Neutro Abs: 4.4 10*3/uL (ref 1.4–6.5)
Neutrophils Relative %: 70 %
Platelets: 119 10*3/uL — ABNORMAL LOW (ref 150–440)
RBC: 4.05 MIL/uL — ABNORMAL LOW (ref 4.40–5.90)
RDW: 15.5 % — ABNORMAL HIGH (ref 11.5–14.5)
WBC: 6.2 10*3/uL (ref 3.8–10.6)

## 2016-08-23 NOTE — Progress Notes (Signed)
Offers no complaints  

## 2016-08-23 NOTE — Telephone Encounter (Signed)
Left message to ask patient if he is still interested in seeing pulmonology for [facility] [address] [place], [place]  [address] Phone:  [phone]

## 2016-08-26 NOTE — Telephone Encounter (Signed)
Patient states he does not want to see pulmonology at this time

## 2016-08-26 NOTE — Telephone Encounter (Signed)
Noted  

## 2016-09-13 ENCOUNTER — Encounter: Payer: Self-pay | Admitting: Family Medicine

## 2016-09-13 ENCOUNTER — Ambulatory Visit (INDEPENDENT_AMBULATORY_CARE_PROVIDER_SITE_OTHER): Payer: PPO | Admitting: Family Medicine

## 2016-09-13 VITALS — BP 120/58 | HR 83 | Temp 97.9°F | Wt 186.2 lb

## 2016-09-13 DIAGNOSIS — IMO0002 Reserved for concepts with insufficient information to code with codable children: Secondary | ICD-10-CM

## 2016-09-13 DIAGNOSIS — R0989 Other specified symptoms and signs involving the circulatory and respiratory systems: Secondary | ICD-10-CM | POA: Diagnosis not present

## 2016-09-13 DIAGNOSIS — E782 Mixed hyperlipidemia: Secondary | ICD-10-CM

## 2016-09-13 DIAGNOSIS — K219 Gastro-esophageal reflux disease without esophagitis: Secondary | ICD-10-CM

## 2016-09-13 DIAGNOSIS — E1159 Type 2 diabetes mellitus with other circulatory complications: Secondary | ICD-10-CM

## 2016-09-13 DIAGNOSIS — E1165 Type 2 diabetes mellitus with hyperglycemia: Secondary | ICD-10-CM | POA: Diagnosis not present

## 2016-09-13 DIAGNOSIS — J432 Centrilobular emphysema: Secondary | ICD-10-CM | POA: Diagnosis not present

## 2016-09-13 MED ORDER — PRAVASTATIN SODIUM 40 MG PO TABS: 40.0000 mg | ORAL_TABLET | Freq: Every day | ORAL | 3 refills | Status: DC

## 2016-09-13 NOTE — Assessment & Plan Note (Signed)
Pravastatin refilled.  

## 2016-09-13 NOTE — Patient Instructions (Signed)
Nice to see you. We'll get you to see GI. I have refilled your pravastatin. You should have a refill of Protonix at your pharmacy. I have provided you with a prescription for diabetic shoes. We'll get you to see the pulmonologist as well. Please continue to monitor your blood sugars. If you've been excessively active during the day you may hold one of your tablets of metformin at night.

## 2016-09-13 NOTE — Progress Notes (Signed)
  [name], MD Phone: [phone]  Matthew Miles. is a 83 y.o. male who presents today for follow-up.  GERD: This is improved. Only one time since we last saw each other has he had any symptoms. Described as a burning in his throat and a bitter taste. No abdominal pain or blood in stool. He is taking Protonix.  Notes his diabetes is under better control. It was 84 this morning. He is taking glipizide, metformin, and Januvia. He wonders if he can hold one of the tablets of metformin at night when he's been active outside. No hypoglycemia or polyuria or polydipsia.  Hyperlipidemia: Taking pravastatin. No chest pain or shortness breath. No myalgias right upper quadrant pain.  COPD: Patient notes no wheezing. Rare cough that is productive of mucus. No shortness of breath. He is currently using Spiriva daily. Uses the albuterol about once a day as well. Has not heard from pulmonology.  PMH: Former smoker   ROS see history of present illness  Objective  Physical Exam Vitals:   09/13/16 1005  BP: (!) 120/58  Pulse: 83  Temp: 97.9 F (36.6 C)    BP Readings from Last 3 Encounters:  09/13/16 (!) 120/58  08/23/16 (!) 156/71  08/09/16 (!) 124/58   Wt Readings from Last 3 Encounters:  09/13/16 186 lb 3.2 oz (84.5 kg)  08/23/16 186 lb 14.4 oz (84.8 kg)  08/09/16 189 lb 8 oz (86 kg)    Physical Exam  Constitutional: No distress.  Cardiovascular: Normal rate, regular rhythm and normal heart sounds.   Pulmonary/Chest: Effort normal and breath sounds normal.  Musculoskeletal: He exhibits no edema.  Neurological: He is alert. Gait normal.  Skin: Skin is warm and dry. He is not diaphoretic.   Diabetic Foot Exam - Simple   Simple Foot Form Diabetic Foot exam was performed with the following findings:  Yes 09/13/2016 10:45 AM  Visual Inspection No deformities, no ulcerations, no other skin breakdown bilaterally:  Yes Sensation Testing Intact to touch and monofilament testing  bilaterally:  Yes Pulse Check See comments:  Yes Comments Decreased PT and DP pulses bilaterally      Assessment/Plan: Please see individual problem list.  COPD (chronic obstructive pulmonary disease) (HCC) Seems to be doing quite well on Spiriva. We'll check in to the pulmonology referral.  GERD (gastroesophageal reflux disease) Improved though continues with Protonix. We'll refer to GI.  Diabetes mellitus type 2, uncontrolled ([place]) Seems to be improved with his CBGs. Discussed holding one of his tablets of metformin at night if he has particularly active during the day. He'll continue to monitor his blood sugars. He'll return as advised for an A1c.  Hyperlipidemia Pravastatin refilled.   Orders Placed This Encounter  Procedures  . Ambulatory referral to Gastroenterology    Referral Priority:   Routine    Referral Type:   Consultation    Referral Reason:   Specialty Services Required    Number of Visits Requested:   1    Meds ordered this encounter  Medications  . pravastatin (PRAVACHOL) 40 MG tablet    Sig: Take 1 tablet (40 mg total) by mouth daily.    Dispense:  90 tablet    Refill:  [place], MD [place]

## 2016-09-13 NOTE — Assessment & Plan Note (Signed)
Improved though continues with Protonix. We'll refer to GI.

## 2016-09-13 NOTE — Assessment & Plan Note (Signed)
Seems to be doing quite well on Spiriva. We'll check in to the pulmonology referral.

## 2016-09-13 NOTE — Assessment & Plan Note (Signed)
Seems to be improved with his CBGs. Discussed holding one of his tablets of metformin at night if he has particularly active during the day. He'll continue to monitor his blood sugars. He'll return as advised for an A1c.

## 2016-09-20 ENCOUNTER — Ambulatory Visit: Payer: PPO | Admitting: Cardiovascular Disease

## 2016-09-21 ENCOUNTER — Ambulatory Visit (INDEPENDENT_AMBULATORY_CARE_PROVIDER_SITE_OTHER): Payer: PPO | Admitting: Podiatry

## 2016-09-21 ENCOUNTER — Encounter: Payer: Self-pay | Admitting: Podiatry

## 2016-09-21 DIAGNOSIS — E1159 Type 2 diabetes mellitus with other circulatory complications: Secondary | ICD-10-CM | POA: Diagnosis not present

## 2016-09-21 DIAGNOSIS — M2040 Other hammer toe(s) (acquired), unspecified foot: Secondary | ICD-10-CM

## 2016-09-21 DIAGNOSIS — Q6650 Congenital pes planus, unspecified foot: Secondary | ICD-10-CM

## 2016-09-21 NOTE — Progress Notes (Signed)
He presents today with chief complaint of painful elongated toenails on my see by getting a new pair of diabetic shoes.   Objective: Pulses are palpable PT bilateral DP is nonpalpable but capillary fill time is immediate. Neurologic sensorium is intact bilateral deep tendon reflexes are intact muscle strength is normal. Orthopedic evaluation demonstrates mild hammertoe deformities flexible in nature but present. No open lesions or wounds noted no pre-ulcerative lesions. Toenails are thick yellow dystrophic with mycotic.  Assessment: Diabetes mellitus with diabetic peripheral vascular disease. Hammertoe deformity bilateral. Pain in limb secondary to onychomycosis.  Plan: Debrided toenails bilateral. He was measured today for diabetic shoes.

## 2016-09-30 ENCOUNTER — Other Ambulatory Visit: Payer: Self-pay | Admitting: Family Medicine

## 2016-09-30 DIAGNOSIS — J449 Chronic obstructive pulmonary disease, unspecified: Secondary | ICD-10-CM

## 2016-10-03 ENCOUNTER — Other Ambulatory Visit: Payer: Self-pay | Admitting: Family Medicine

## 2016-10-03 ENCOUNTER — Telehealth: Payer: Self-pay | Admitting: *Deleted

## 2016-10-03 DIAGNOSIS — R0989 Other specified symptoms and signs involving the circulatory and respiratory systems: Secondary | ICD-10-CM

## 2016-10-03 NOTE — Telephone Encounter (Signed)
I typically refer patients to GI for reflux if they have persistent symptoms or persistently need medications for reflux as there are risks with persistent symptoms and the medications with long-term use. GI evaluation will help [place] determine if he needs these medications long-term or if he needs anything like an endoscopy. Thanks.

## 2016-10-03 NOTE — Telephone Encounter (Signed)
Daughter requested a call , pt was unsure for the reason of his referral on 10/04/16. Daughter requested a better understanding for the need of the referral.  [place] [phone]

## 2016-10-03 NOTE — Telephone Encounter (Signed)
Patient daughter was referring to his appointment to [facility], per Dr.Sonnenberg it is for an ultrasound on his legs to check blood flow

## 2016-10-03 NOTE — Telephone Encounter (Signed)
Please advise as to reason, referral to GI states GERD

## 2016-10-04 ENCOUNTER — Ambulatory Visit (INDEPENDENT_AMBULATORY_CARE_PROVIDER_SITE_OTHER): Payer: PPO

## 2016-10-04 DIAGNOSIS — R0989 Other specified symptoms and signs involving the circulatory and respiratory systems: Secondary | ICD-10-CM | POA: Diagnosis not present

## 2016-10-13 ENCOUNTER — Ambulatory Visit (INDEPENDENT_AMBULATORY_CARE_PROVIDER_SITE_OTHER): Payer: PPO | Admitting: Family Medicine

## 2016-10-13 ENCOUNTER — Encounter: Payer: Self-pay | Admitting: Family Medicine

## 2016-10-13 ENCOUNTER — Telehealth: Payer: Self-pay | Admitting: Family Medicine

## 2016-10-13 ENCOUNTER — Ambulatory Visit (INDEPENDENT_AMBULATORY_CARE_PROVIDER_SITE_OTHER): Payer: PPO

## 2016-10-13 VITALS — BP 113/64 | HR 88 | Temp 98.4°F | Resp 16 | Wt 191.0 lb

## 2016-10-13 DIAGNOSIS — M25562 Pain in left knee: Secondary | ICD-10-CM

## 2016-10-13 DIAGNOSIS — S8992XA Unspecified injury of left lower leg, initial encounter: Secondary | ICD-10-CM | POA: Diagnosis not present

## 2016-10-13 DIAGNOSIS — M79605 Pain in left leg: Secondary | ICD-10-CM | POA: Insufficient documentation

## 2016-10-13 IMAGING — DX DG KNEE COMPLETE 4+V*L*
5 series · 5 of 5 positions shown · non-contrast
Comparison: None in PACs

CLINICAL DATA: Fall, knee pain

EXAM:
LEFT KNEE - COMPLETE 4+ VIEW

[knee standing ap]
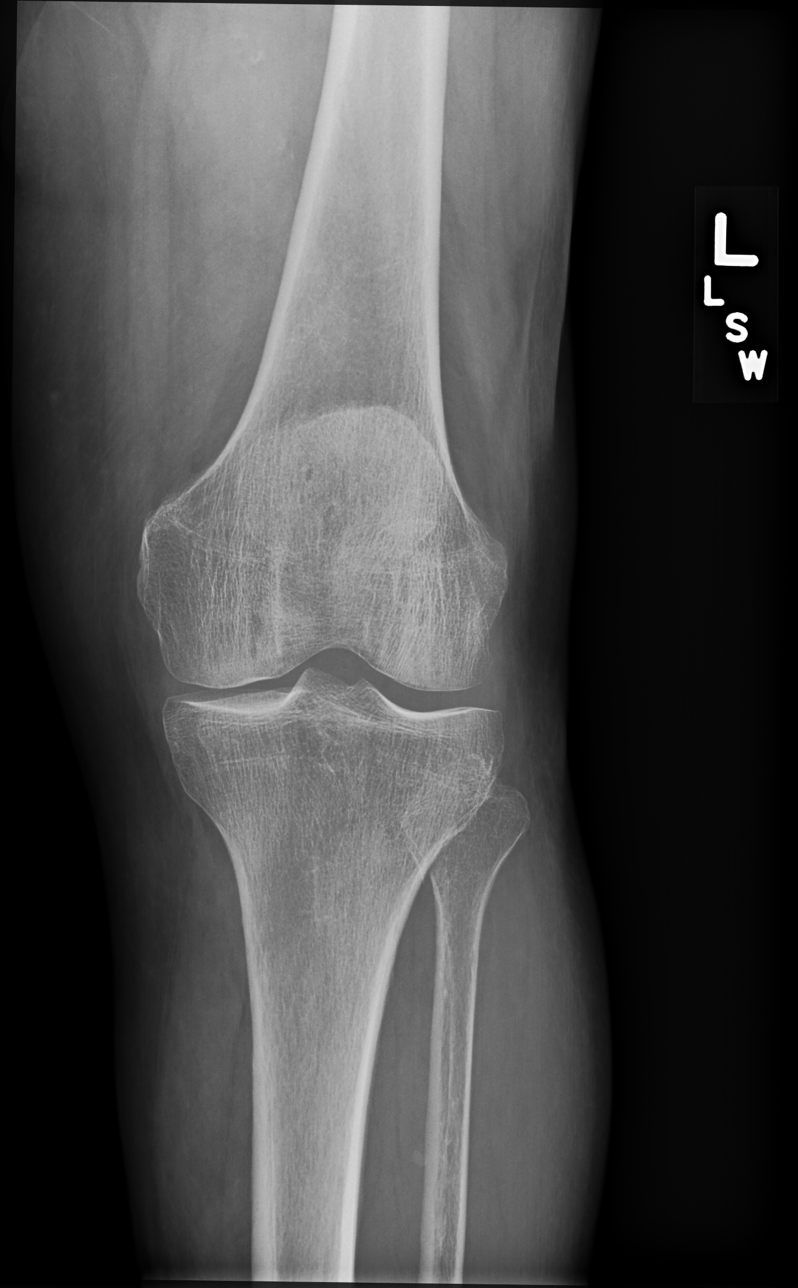

[knee standing external ap]
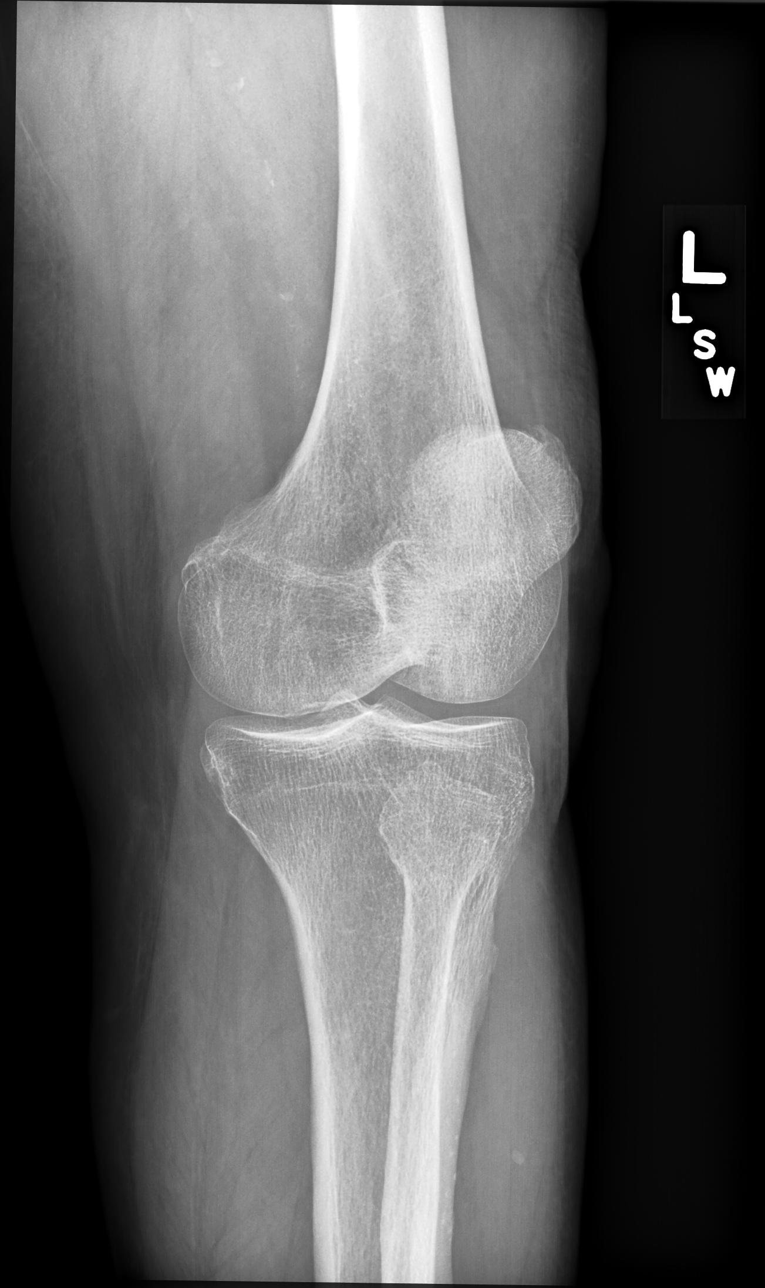

[knee standing internal ap]
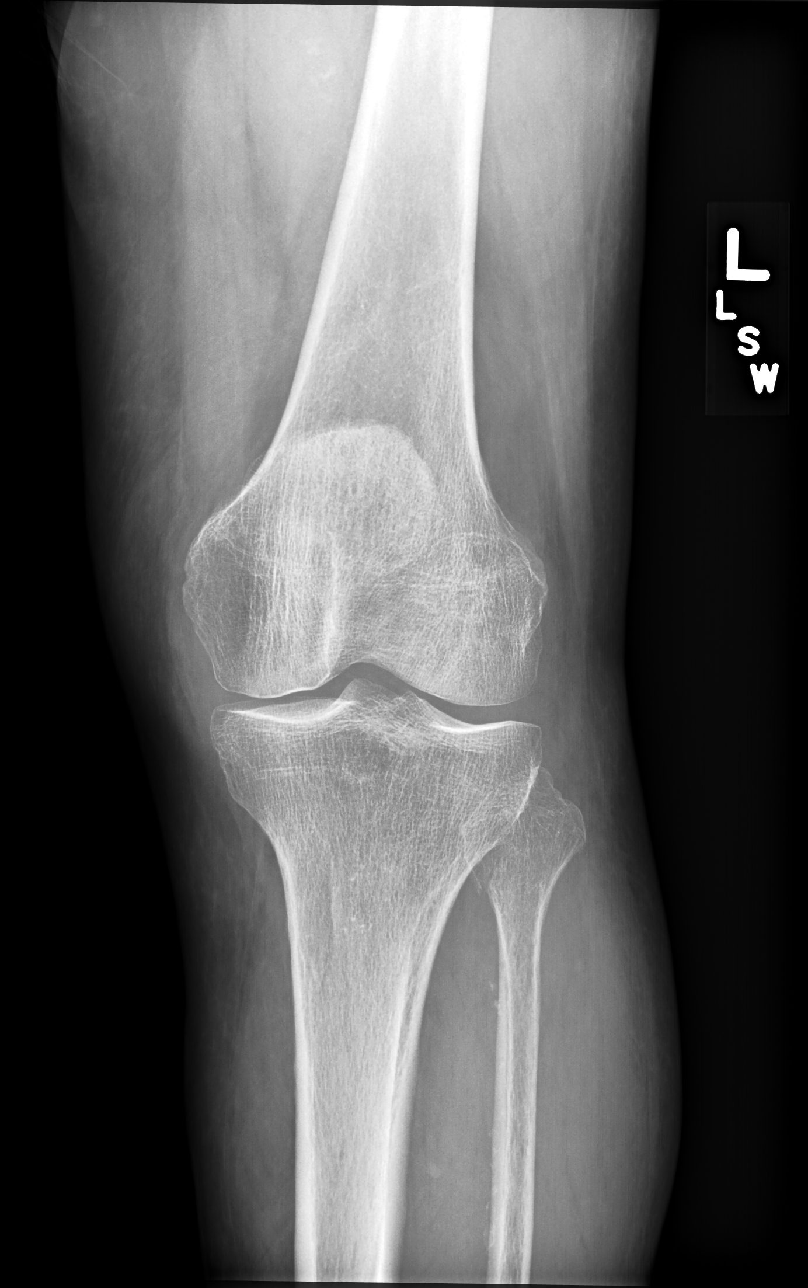

[knee standing lat]
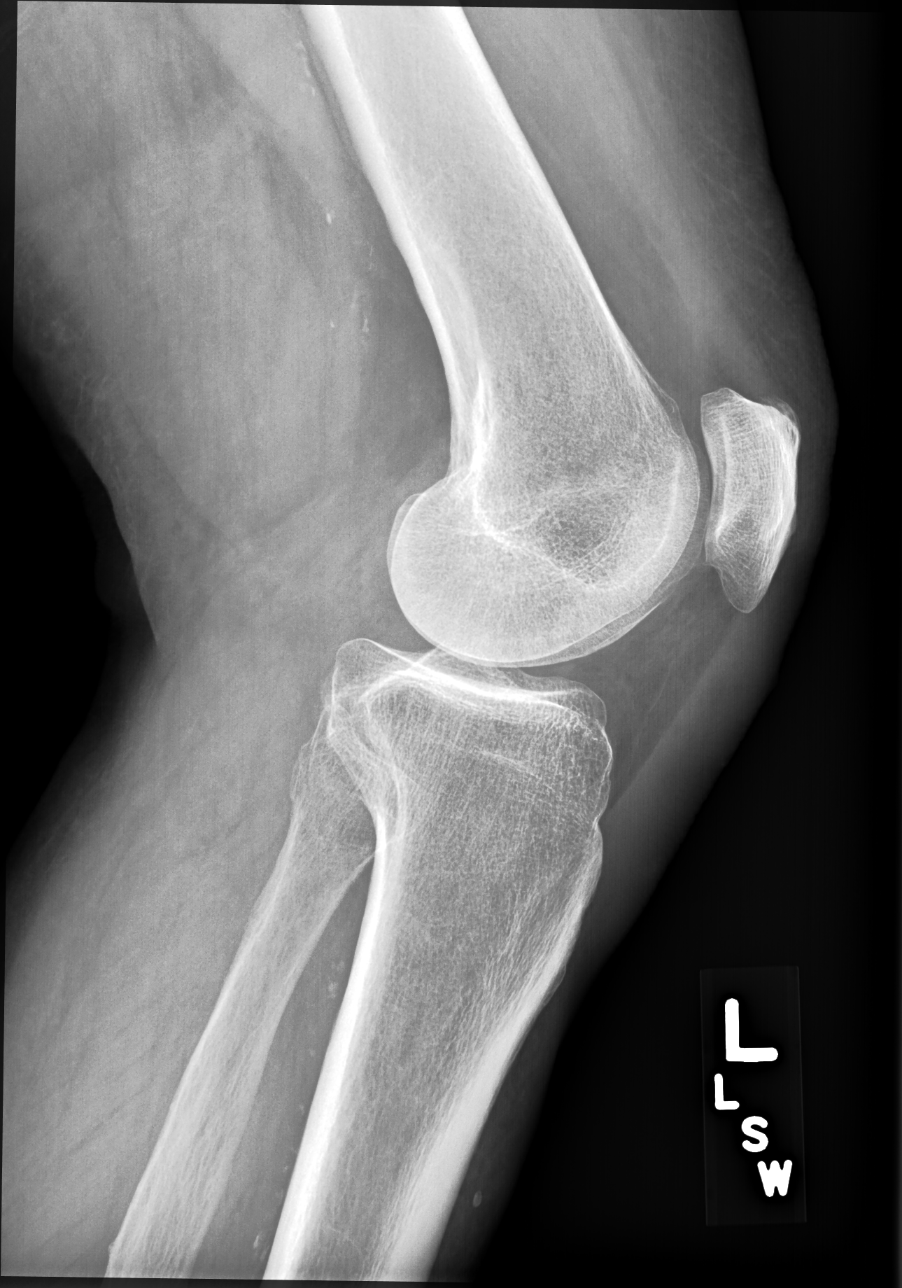

[knee [person_name] view pa]
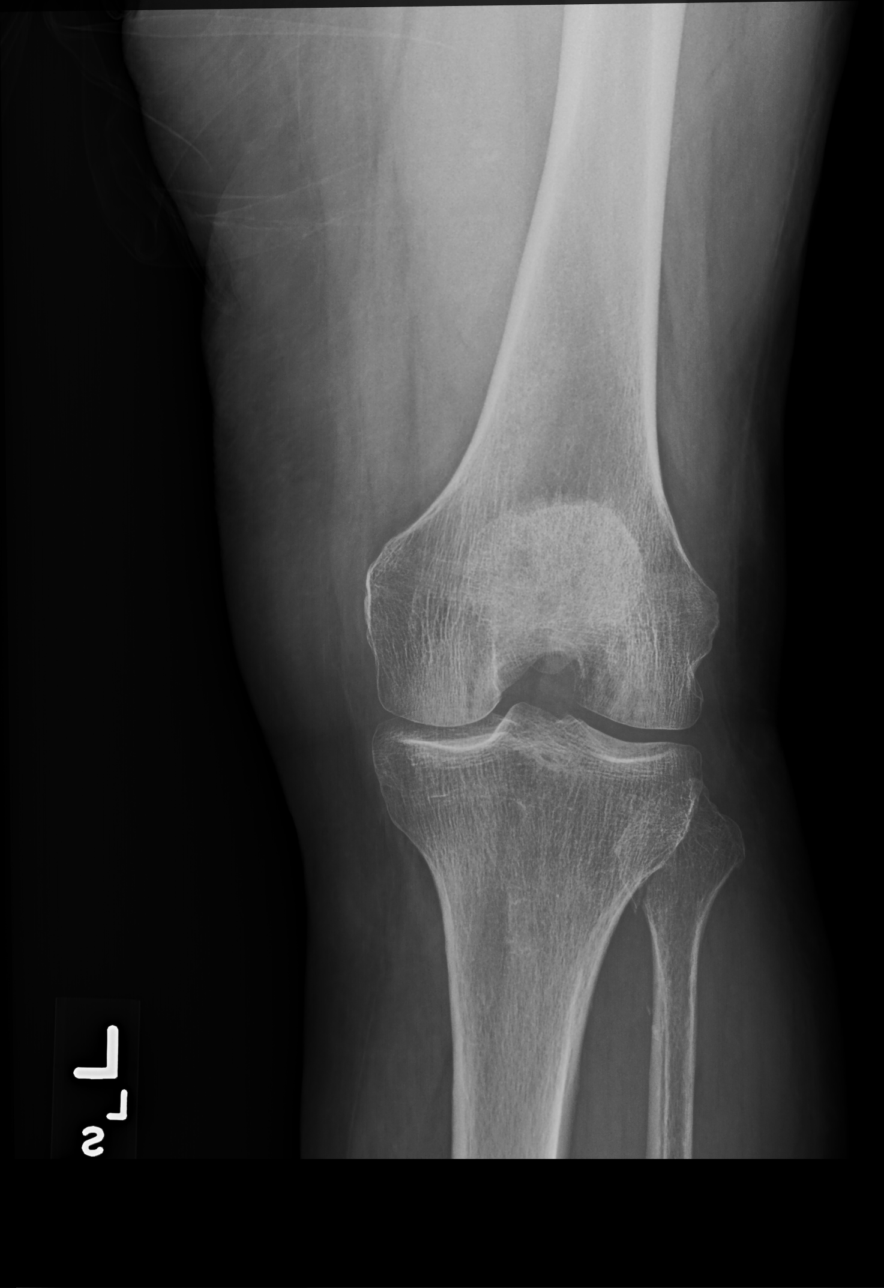

[5 of 5 positions shown; findings below may reference images not displayed]

FINDINGS: The bones are subjectively mildly osteopenic. No acute fracture or
dislocation is observed. There is no joint effusion. The joint
spaces are reasonably well-maintained. No significant osteophyte
formation is observed.
IMPRESSION: There is no acute or significant chronic bony abnormality of the
left knee.

## 2016-10-13 MED ORDER — TRAMADOL HCL 50 MG PO TABS: 50.0000 mg | ORAL_TABLET | Freq: Three times a day (TID) | ORAL | 0 refills | Status: DC | PRN

## 2016-10-13 NOTE — Assessment & Plan Note (Signed)
New problem. X-ray negative. Treating with tramadol.

## 2016-10-13 NOTE — Patient Instructions (Addendum)
Use the tramadol as needed.  We will call with the results.  Take care  Dr. Lacinda Axon

## 2016-10-13 NOTE — Telephone Encounter (Signed)
Pt called back requesting results. Please advise, thank you!  Call pt @ [phone]

## 2016-10-13 NOTE — Telephone Encounter (Signed)
Patient advised of results see labs for documentation 
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
## 2016-10-13 NOTE — Telephone Encounter (Signed)
Pt daughter called back in regards to results. Please advise, thank you!  Call [place] @ [phone]

## 2016-10-20 ENCOUNTER — Telehealth: Payer: Self-pay | Admitting: Family Medicine

## 2016-10-20 NOTE — Telephone Encounter (Signed)
[facility] Patient Name: Matthew Miles DOB: 07/15/1928 Initial Comment callers fathers leg is hurting. Its been hurting since he fell 2 weeks ago . he had x -rays done but they didnt see anything . Its not swollen , just painful Nurse Assessment Nurse: Markus Daft, RN, Sherre Poot Date/Time (Eastern Time): 10/20/2016 10:52:39 AM Confirm and document reason for call. If symptomatic, describe symptoms. ---Caller states that her father is still c/o left leg pain since he fell 2 weeks ago. X-ray of knee was negative for any problem. He is walking a little better than when he was seen. Pain is not worse, but not better. No fever, redness, or swelling. Does the patient have any new or worsening symptoms? ---Yes Will a triage be completed? ---Yes Related visit to physician within the last 2 weeks? ---Yes Does the PT have any chronic conditions? (i.e. diabetes, asthma, etc.) ---Yes List chronic conditions. ---diabetic, heart meds - blood thinners, heart murmur Is this a behavioral health or substance abuse call? ---No Guidelines Guideline Title Affirmed Question Affirmed Notes Leg Injury [1] After 3 days AND [2] still limping Final Disposition User See PCP When Office is Open (within 3 days) Markus Daft, Therapist, sports, [place] Comments When he fell, it was from slipping on some rocks. Daughter wants an x-ray to entire leg, or MRI if possible w/o being Seen.  Willing to make appt in the meantime. Appt made with Dr. [name] for 10/21/16 at 10:30 am Disagree/Comply: Comply

## 2016-10-20 NOTE — Telephone Encounter (Signed)
Appt with PCP made, thanks

## 2016-10-21 ENCOUNTER — Ambulatory Visit: Payer: PPO

## 2016-10-21 ENCOUNTER — Ambulatory Visit
Admission: RE | Admit: 2016-10-21 | Discharge: 2016-10-21 | Disposition: A | Discharge status: Home / Self Care | Payer: PPO | Source: Ambulatory Visit | Attending: Family Medicine | Admitting: Family Medicine

## 2016-10-21 ENCOUNTER — Encounter: Payer: Self-pay | Admitting: Family Medicine

## 2016-10-21 ENCOUNTER — Ambulatory Visit (INDEPENDENT_AMBULATORY_CARE_PROVIDER_SITE_OTHER): Payer: PPO

## 2016-10-21 ENCOUNTER — Telehealth: Payer: Self-pay | Admitting: Family Medicine

## 2016-10-21 ENCOUNTER — Ambulatory Visit (INDEPENDENT_AMBULATORY_CARE_PROVIDER_SITE_OTHER): Payer: PPO | Admitting: Family Medicine

## 2016-10-21 VITALS — BP 140/60 | HR 90 | Temp 98.1°F | Wt 189.8 lb

## 2016-10-21 DIAGNOSIS — M79605 Pain in left leg: Secondary | ICD-10-CM

## 2016-10-21 DIAGNOSIS — S8992XA Unspecified injury of left lower leg, initial encounter: Secondary | ICD-10-CM | POA: Diagnosis not present

## 2016-10-21 DIAGNOSIS — M7989 Other specified soft tissue disorders: Secondary | ICD-10-CM | POA: Insufficient documentation

## 2016-10-21 IMAGING — DX DG TIBIA/FIBULA 2V*L*
4 series · 4 of 4 positions shown · non-contrast
Comparison: [DATE]

CLINICAL DATA: Pt states he fell onset 2 weeks ago and is still
having pain in his mid lower left leg. Pt denies surgery to lower
left leg.

EXAM:
LEFT TIBIA AND FIBULA - 2 VIEW

[lower leg lat (1 of 2)]
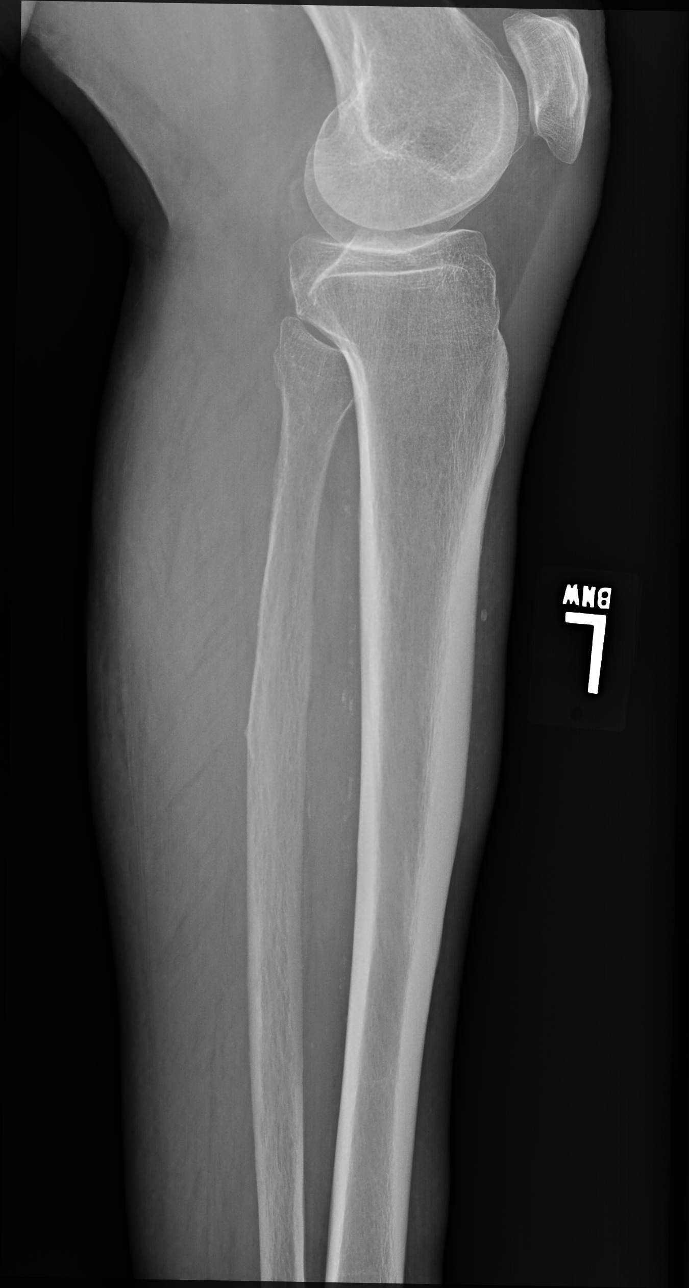

[lower leg ap (1 of 2)]
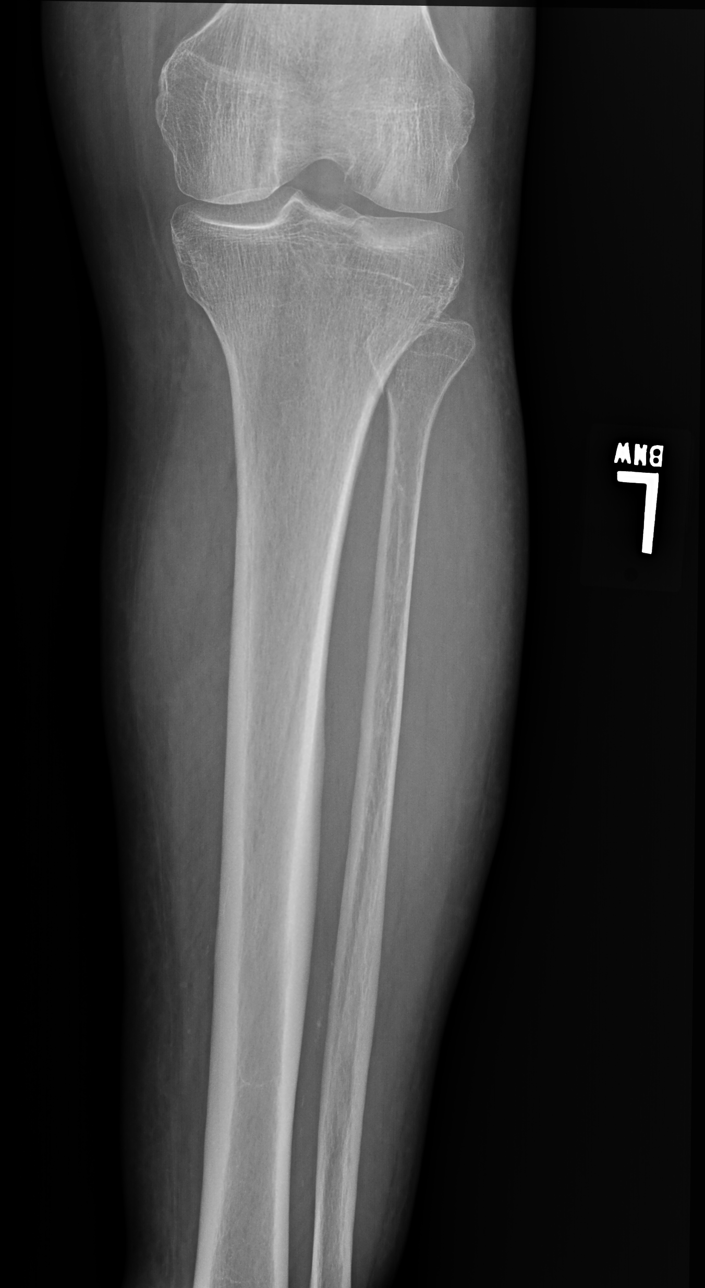

[lower leg lat (2 of 2)]
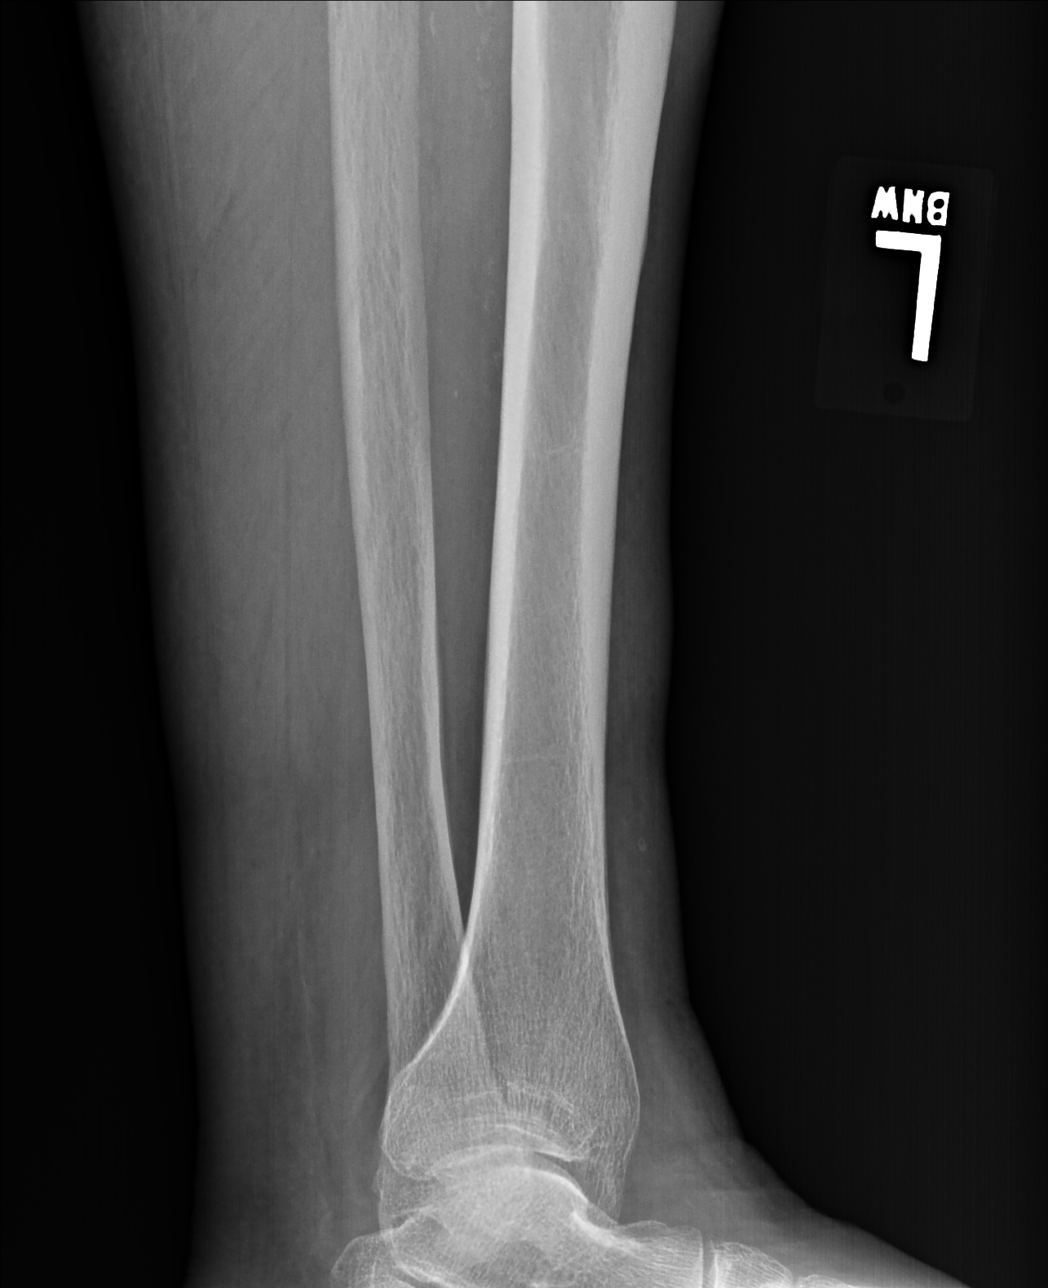

[lower leg ap (2 of 2)]
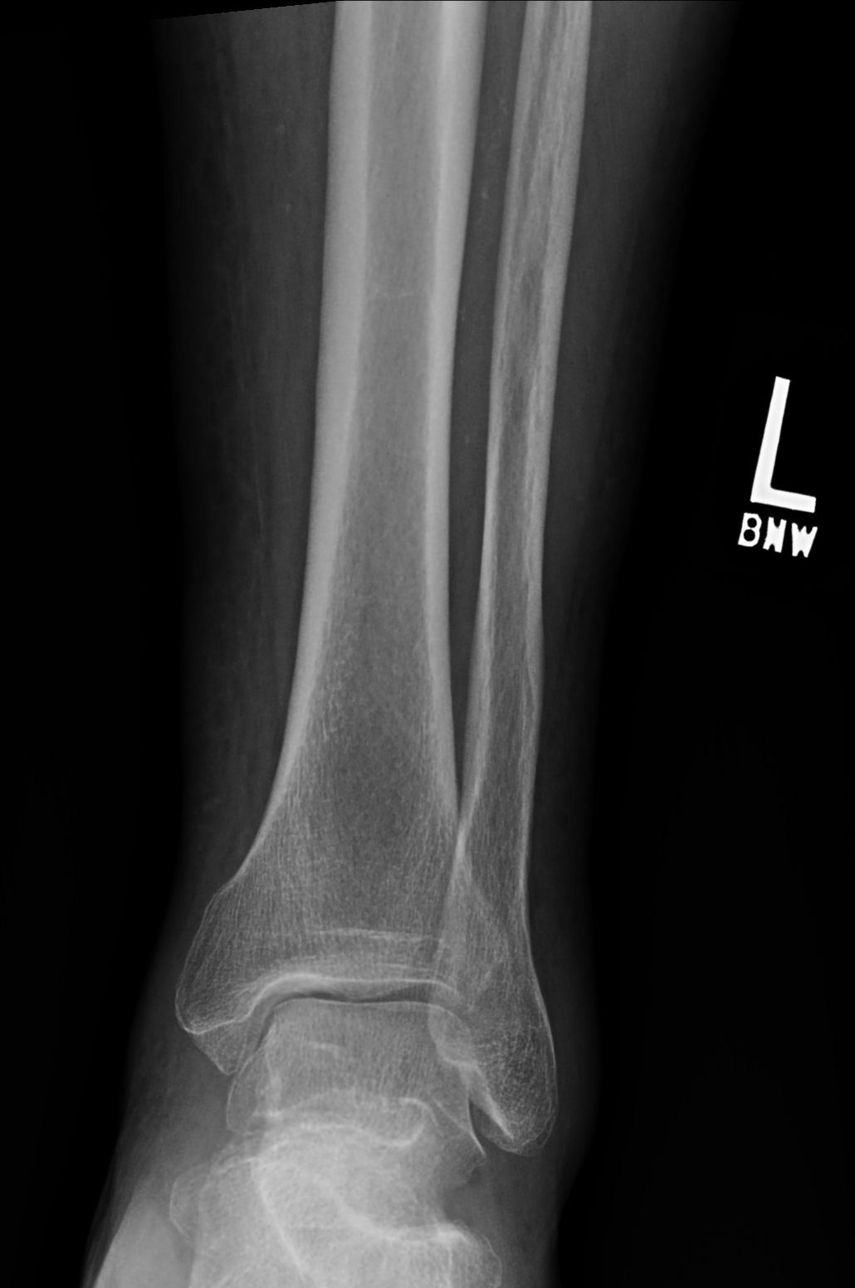

[4 of 4 positions shown; findings below may reference images not displayed]

FINDINGS: There is no acute fracture or subluxation. Vascular calcifications
are present. No radiopaque foreign body or soft tissue gas.
IMPRESSION: No evidence for acute  abnormality.

## 2016-10-21 NOTE — Telephone Encounter (Signed)
Noted. Result notes sent to [facility].

## 2016-10-21 NOTE — Patient Instructions (Signed)
Nice to see you. We are going to get an ultrasound of your left leg to rule out DVT. We are going to get an x-ray of your lower leg as well. We'll contact you with the results. If you have worsening pain or you develop chest pain or shortness breath please be reevaluated immediately.

## 2016-10-21 NOTE — Progress Notes (Signed)
  [name], MD Phone: [phone]  Matthew Miles. is a 83 y.o. male who presents today for follow-up.   Patient was seen several weeks ago by another provider in our practice for left knee and leg pain status post fall. Patient was walking on gravel and fell onto his knees and left lateral lower leg. He had a negative x-ray of his left knee. He was provided with tramadol. This did help some. The pain has not improved or worsened. He does note some swelling in the leg. Tenderness over the left lateral mid fibula. Also in the left posterior calf. No chest pain or shortness of breath.  ROS see history of present illness  Objective  Physical Exam Vitals:   10/21/16 1018  BP: 140/60  Pulse: 90  Temp: 98.1 F (36.7 C)    BP Readings from Last 3 Encounters:  10/21/16 140/60  10/13/16 113/64  09/13/16 (!) 120/58   Wt Readings from Last 3 Encounters:  10/21/16 189 lb 12.8 oz (86.1 kg)  10/13/16 191 lb (86.6 kg)  09/13/16 186 lb 3.2 oz (84.5 kg)    Physical Exam  Constitutional: No distress.  Cardiovascular: Normal rate, regular rhythm and normal heart sounds.   Pulmonary/Chest: Effort normal and breath sounds normal.  Musculoskeletal:       Legs: Bilateral knees with anterior abrasions do not appear infected, no tenderness to the knees, warmth, or erythema, no ligament laxity, negative McMurray's  Skin: He is not diaphoretic.     Assessment/Plan: Please see individual problem list.  Left leg pain Patient status post fall with continued left leg pain. There is a discrepancy between the 2 legs. Does have tenderness in the calf. We will obtain an ultrasound ruled out DVT. We will obtain x-ray of tib-fib to rule out fracture of that area as well. We'll determine management based on the results of these imaging studies.   Orders Placed This Encounter  Procedures  . US Venous Img Lower Unilateral Left    Standing Status:   Future    Number of Occurrences:   1   Standing Expiration Date:   12/22/2017    Order Specific Question:   Reason for Exam (SYMPTOM  OR DIAGNOSIS REQUIRED)    Answer:   left lower extremity swelling and calf pain    Order Specific Question:   Preferred imaging location?    Answer:   [facility]  . DG Tibia/Fibula Left    Standing Status:   Future    Number of Occurrences:   1    Standing Expiration Date:   12/22/2017    Order Specific Question:   Reason for Exam (SYMPTOM  OR DIAGNOSIS REQUIRED)    Answer:   left leg pain and swelling s/p fall 2 weeks ago    Order Specific Question:   Preferred imaging location?    Answer:   [facility] Specific Question:   Radiology Contrast Protocol - do NOT remove file path    Answer:   [email or web address]   [name], MD [place]

## 2016-10-21 NOTE — Assessment & Plan Note (Signed)
Patient status post fall with continued left leg pain. There is a discrepancy between the 2 legs. Does have tenderness in the calf. We will obtain an ultrasound ruled out DVT. We will obtain x-ray of tib-fib to rule out fracture of that area as well. We'll determine management based on the results of these imaging studies.

## 2016-10-21 NOTE — Telephone Encounter (Signed)
[place] [facility] called with call report for [place] and X-ray report s are in chart.

## 2016-10-24 ENCOUNTER — Ambulatory Visit: Payer: PPO

## 2016-10-26 ENCOUNTER — Ambulatory Visit (INDEPENDENT_AMBULATORY_CARE_PROVIDER_SITE_OTHER): Payer: PPO | Admitting: Gastroenterology

## 2016-10-26 ENCOUNTER — Encounter: Payer: Self-pay | Admitting: Gastroenterology

## 2016-10-26 VITALS — BP 136/72 | HR 92 | Temp 98.2°F | Ht 63.0 in | Wt 188.4 lb

## 2016-10-26 DIAGNOSIS — K219 Gastro-esophageal reflux disease without esophagitis: Secondary | ICD-10-CM | POA: Diagnosis not present

## 2016-10-26 MED ORDER — PANTOPRAZOLE SODIUM 20 MG PO TBEC: 20.0000 mg | DELAYED_RELEASE_TABLET | Freq: Every day | ORAL | 0 refills | Status: DC

## 2016-10-26 NOTE — Progress Notes (Signed)
Jonathon Bellows MD, MRCP(U.K) [address]  [place]  [place], [place] [address]  Main: [phone]  Fax: [phone]   Gastroenterology Consultation  Referring Provider:     [name], MD Primary Care Physician:  [name], MD Primary Gastroenterologist:  Dr. Jonathon Bellows  Reason for Consultation:     GERD        HPI:   Matthew Miles. is a 83 y.o. y/o male referred for consultation & management  by Dr. [name], [name], MD.    Reflux:  Onset : many years , he was on Prilosec for some years - stopped working a few weeks back , felt he had chest pain , was burping a lot , went to the hospital , was given a gi cocktail and relieved his pain and was changed to protonix , since then he has not had any issues with reflux. His main symptoms were chest tightness . No recent weight gain .  Dinner time : early dinner around 530 pm   Prior EGD: many years back- was normal  Family history of esophageal cancer:no   No issues with swallowing . On protonix - cant recall the dose.    Past Medical History:  Diagnosis Date  . Allergy   . Arthritis   . CHF (congestive heart failure) ([place])   . Chicken pox   . Cholecystitis   . Colon polyps   . COPD (chronic obstructive pulmonary disease) ([place])   . Coronary artery disease   . Emphysema of lung ([place])   . GERD (gastroesophageal reflux disease)   . Heart murmur   . Hematemesis/vomiting blood 04/10/11  . Hypercholesterolemia   . Mild hypertension   . Pancreatitis   . Prostate cancer ([place])   . Prostate cancer [facility])    radiation therapy   . Smoker   . Ulcer   . Upper GI bleed 1980    Past Surgical History:  Procedure Laterality Date  . APPENDECTOMY    . CARDIAC CATHETERIZATION  May 2002 and Feb 2013   [facility]; no stents   . CATARACT EXTRACTION    . CHOLECYSTECTOMY    . CIRCUMCISION    . COLONOSCOPY    . HEMORRHOID SURGERY    . SP CHOLECYSTOMY    . STOMACH SURGERY     bleeding ulcers, followed by Dr.  [name]    Prior to Admission medications   Medication Sig Start Date End Date Taking? Authorizing Provider  aspirin 81 MG tablet Take 81 mg by mouth daily.    Yes [provider]  carvedilol (COREG) 3.125 MG tablet Take 1 tablet (3.125 mg total) by mouth 2 (two) times daily. 06/30/16  Yes [name], MD  Cyanocobalamin (B-12) 1000 MCG TBCR Take 1 tablet by mouth daily.    Yes [provider]  glipiZIDE (GLUCOTROL XL) 10 MG 24 hr tablet Take 1 tablet (10 mg total) by mouth 2 (two) times daily. 06/30/16  Yes [name], MD  glucose blood test strip One touch  berio  test strips. Use to check blood sugar two times a day. Dx. E11.9 Patient taking differently: One touch  verio  test strips. Use to check blood sugar two times a day. Dx. E11.9 05/12/16  Yes [name], MD  metFORMIN (GLUCOPHAGE XR) 500 MG 24 hr tablet Take 2 tablets (1,000 mg total) by mouth 2 (two) times daily. 07/04/16  Yes [name], MD  metFORMIN (GLUCOPHAGE)  500 MG tablet  08/04/16  Yes [provider]  nitroGLYCERIN (NITROSTAT) 0.4 MG SL tablet Place 1 tablet (0.4 mg total) under the tongue every 5 (five) minutes as needed. 07/04/16  Yes [name], MD  pantoprazole (PROTONIX) 40 MG tablet Take 1 tablet (40 mg total) by mouth daily. 08/05/16  Yes [name], MD  potassium chloride SA (K-DUR,KLOR-CON) 20 MEQ tablet Take 1 tablet (20 mEq total) by mouth daily. 03/30/16  Yes [name], MD  pravastatin (PRAVACHOL) 40 MG tablet Take 1 tablet (40 mg total) by mouth daily. 09/13/16  Yes [name], MD  PROAIR HFA 108 [phone] Base) MCG/ACT inhaler INHALE 2 PUFFS INTO THE LUNGS EVERY 6 HOURS AS NEEDED FOR WHEEZING OR SHORTNESS OF BREATH 10/03/16  Yes [name], MD  sitaGLIPtin (JANUVIA) 50 MG tablet Take 1 tablet (50 mg total) by mouth daily. 02/03/16  Yes [name], MD  tiotropium (SPIRIVA HANDIHALER) 18 MCG inhalation capsule Place 1 capsule  (18 mcg total) into inhaler and inhale daily. 08/09/16  Yes Minna Merritts, MD  torsemide (DEMADEX) 10 MG tablet Take 1 tablet (10 mg total) by mouth 2 (two) times daily as needed. Patient taking differently: Take 10-20 mg by mouth 2 (two) times daily. 20 mg every morning and 10 mg every evening (skips on Sundays mornings). 06/30/16 07/06/17 Yes [name], MD  traMADol (ULTRAM) 50 MG tablet Take 1 tablet (50 mg total) by mouth every 8 (eight) hours as needed. 10/13/16  Yes Coral Spikes, DO    Family History  Problem Relation Age of Onset  . Prostate cancer Father   . Liver cancer Brother   . Prostate cancer Brother   . Emphysema Sister        smoker     Social History  Substance Use Topics  . Smoking status: Former Smoker    Packs/day: 1.00    Years: 65.00    Types: Cigarettes    Quit date: 04/10/2011  . Smokeless tobacco: Former Systems developer    Types: Chew  . Alcohol use Yes     Comment: 1 beer rarely    Allergies as of 10/26/2016 - Review Complete 10/21/2016  Allergen Reaction Noted  . Sulfa antibiotics  04/25/2011  . Tradjenta [linagliptin] Other (See Comments) 01/17/2014    Review of Systems:    All systems reviewed and negative except where noted in HPI.   Physical Exam:  BP 136/72   Pulse 92   Temp 98.2 F (36.8 C) (Oral)   Ht 5\' 3"  (1.6 m)   Wt 188 lb 6.4 oz (85.5 kg)   BMI 33.37 kg/m  No LMP for male patient. Psych:  Alert and cooperative. Normal mood and affect. General:   Alert,  Well-developed, well-nourished, pleasant and cooperative in NAD Head:  Normocephalic and atraumatic. Eyes:  Sclera clear, no icterus.   Conjunctiva pink. Ears:  Normal auditory acuity. Nose:  No deformity, discharge, or lesions. Mouth:  No deformity or lesions,oropharynx pink & moist. Neck:  Supple; no masses or thyromegaly. Lungs:  Respirations even and unlabored.  Clear throughout to auscultation.   No wheezes, crackles, or rhonchi. No acute distress. Heart:  Regular rate  and rhythm; no murmurs, clicks, rubs, or gallops. Abdomen:  Normal bowel sounds.  No bruits.  Soft, non-tender and non-distended without masses, hepatosplenomegaly or hernias noted.  No guarding or rebound tenderness.    Msk:  Symmetrical without gross deformities. Good, equal movement & strength  bilaterally. Pulses:  Normal pulses noted. Extremities:  No clubbing or edema.  No cyanosis. Neurologic:  Alert and oriented x3;  grossly normal neurologically. Psych:  Alert and cooperative. Normal mood and affect.  Imaging Studies: Dg Tibia/fibula Left  Result Date: 10/21/2016 CLINICAL DATA:  Pt states he fell onset 2 weeks ago and is still having pain in his mid lower left leg. Pt denies surgery to lower left leg. EXAM: LEFT TIBIA AND FIBULA - 2 VIEW COMPARISON:  10/13/2016 FINDINGS: There is no acute fracture or subluxation. Vascular calcifications are present. No radiopaque foreign body or soft tissue gas. IMPRESSION: No evidence for acute  abnormality. Electronically Signed   By: Nolon Nations M.D.   On: 10/21/2016 11:12   US Venous Img Lower Unilateral Left  Result Date: 10/21/2016 CLINICAL DATA:  Left lower extremity pain and swelling for the past 2 weeks. History of prostate cancer. Evaluate for DVT. EXAM: LEFT LOWER EXTREMITY VENOUS DOPPLER ULTRASOUND TECHNIQUE: Gray-scale sonography with graded compression, as well as color Doppler and duplex ultrasound were performed to evaluate the lower extremity deep venous systems from the level of the common femoral vein and including the common femoral, femoral, profunda femoral, popliteal and calf veins including the posterior tibial, peroneal and gastrocnemius veins when visible. The superficial great saphenous vein was also interrogated. Spectral Doppler was utilized to evaluate flow at rest and with distal augmentation maneuvers in the common femoral, femoral and popliteal veins. COMPARISON:  None. FINDINGS: Contralateral Common Femoral Vein:  Respiratory phasicity is normal and symmetric with the symptomatic side. No evidence of thrombus. Normal compressibility. Common Femoral Vein: No evidence of thrombus. Normal compressibility, respiratory phasicity and response to augmentation. Saphenofemoral Junction: No evidence of thrombus. Normal compressibility and flow on color Doppler imaging. Profunda Femoral Vein: No evidence of thrombus. Normal compressibility and flow on color Doppler imaging. Femoral Vein: No evidence of thrombus. Normal compressibility, respiratory phasicity and response to augmentation. Popliteal Vein: No evidence of thrombus. Normal compressibility, respiratory phasicity and response to augmentation. Calf Veins: No evidence of thrombus. Normal compressibility and flow on color Doppler imaging. Superficial Great Saphenous Vein: No evidence of thrombus. Normal compressibility and flow on color Doppler imaging. Venous Reflux:  None. Other Findings:  None. IMPRESSION: No evidence of DVT within the left lower extremity. Electronically Signed   By: Sandi Mariscal M.D.   On: 10/21/2016 12:15   Dg Knee Complete 4 Views Left  Result Date: 10/13/2016 CLINICAL DATA:  Fall, knee pain EXAM: LEFT KNEE - COMPLETE 4+ VIEW COMPARISON:  None in PACs FINDINGS: The bones are subjectively mildly osteopenic. No acute fracture or dislocation is observed. There is no joint effusion. The joint spaces are reasonably well-maintained. No significant osteophyte formation is observed. IMPRESSION: There is no acute or significant chronic bony abnormality of the left knee. Electronically Signed   By: David  Martinique M.D.   On: 10/13/2016 11:08    Assessment and Plan:   Franciso Dierks. is a 83 y.o. y/o male has been referred for GERD. Doing well on Protonix 40 mg a day . Will plan to decrease to 20 mg a day .    Counseled on life style changes, suggest to use PPI first thing in the morning on empty stomach and eat 30 minutes after. Advised on the use of a  wedge pillow at night , avoid meals for 2 hours prior to bed time. Discussed the risks and benefits of long term PPI use including but not limited to bone  loss, chronic kidney disease, infections , low magnesium . Aim to use at the lowest dose for the shortest period of time   Patient information provided    Follow up PRN  Dr Jonathon Bellows MD,MRCP(U.K)

## 2016-11-02 ENCOUNTER — Ambulatory Visit (INDEPENDENT_AMBULATORY_CARE_PROVIDER_SITE_OTHER): Payer: PPO | Admitting: Podiatry

## 2016-11-02 DIAGNOSIS — Q6650 Congenital pes planus, unspecified foot: Secondary | ICD-10-CM | POA: Diagnosis not present

## 2016-11-02 DIAGNOSIS — E1159 Type 2 diabetes mellitus with other circulatory complications: Secondary | ICD-10-CM

## 2016-11-02 DIAGNOSIS — M2040 Other hammer toe(s) (acquired), unspecified foot: Secondary | ICD-10-CM

## 2016-11-02 NOTE — Progress Notes (Signed)

## 2016-11-06 ENCOUNTER — Other Ambulatory Visit: Payer: Self-pay | Admitting: Family Medicine

## 2016-11-06 DIAGNOSIS — J449 Chronic obstructive pulmonary disease, unspecified: Secondary | ICD-10-CM

## 2016-11-09 ENCOUNTER — Ambulatory Visit (INDEPENDENT_AMBULATORY_CARE_PROVIDER_SITE_OTHER): Payer: PPO | Admitting: Family Medicine

## 2016-11-09 ENCOUNTER — Encounter: Payer: Self-pay | Admitting: Family Medicine

## 2016-11-09 VITALS — BP 140/60 | HR 89 | Temp 98.2°F | Wt 190.8 lb

## 2016-11-09 DIAGNOSIS — I5032 Chronic diastolic (congestive) heart failure: Secondary | ICD-10-CM | POA: Diagnosis not present

## 2016-11-09 DIAGNOSIS — E1165 Type 2 diabetes mellitus with hyperglycemia: Secondary | ICD-10-CM | POA: Diagnosis not present

## 2016-11-09 DIAGNOSIS — IMO0002 Reserved for concepts with insufficient information to code with codable children: Secondary | ICD-10-CM

## 2016-11-09 DIAGNOSIS — E1159 Type 2 diabetes mellitus with other circulatory complications: Secondary | ICD-10-CM | POA: Diagnosis not present

## 2016-11-09 DIAGNOSIS — J432 Centrilobular emphysema: Secondary | ICD-10-CM | POA: Diagnosis not present

## 2016-11-09 NOTE — Patient Instructions (Signed)
Nice to see you. Please use your inhalers as we discussed. Please monitor swelling and if it increases or you have weight gain greater than 3 pounds in 1 day or 5 pounds in 1 week please let us know immediately. We'll contact you with your lab results.

## 2016-11-09 NOTE — Assessment & Plan Note (Signed)
Symptomatically improved. Discussed appropriate use of albuterol and Spiriva. Will monitor his symptoms and if he uses albuterol on a daily basis to let us know as we will need to consider alternative medication.

## 2016-11-09 NOTE — Progress Notes (Signed)
  [name], MD Phone: [phone]  Matthew Miles. is a 83 y.o. male who presents today for follow-up.  Diabetes: Ranging in the 90s to low 100s. Taking glipizide, metformin, and Januvia. Notes he is urinating less since her sugars a been under better control. No hypoglycemic symptoms.  Diastolic heart failure: Following with cardiology. He is taking his current medications. Does take the torsemide daily. Slight edema in his bilateral lower extremities most days though it goes down overnight. No significant weight gain. No orthopnea or PND.  COPD: Notes this is quite a bit better. He seems somewhat confused on how to take his albuterol and Spiriva. His daughter notes he is not actually taking the albuterol twice daily the patient states he is. He thinks he is taking the Spiriva twice daily as well. No cough. Rare wheezing. No dyspnea. Checks his oxygen at home and it is typically 97.  PMH: Former smoker   ROS see history of present illness  Objective  Physical Exam Vitals:   11/09/16 1509  BP: 140/60  Pulse: 89  Temp: 98.2 F (36.8 C)    BP Readings from Last 3 Encounters:  11/09/16 140/60  10/26/16 136/72  10/21/16 140/60   Wt Readings from Last 3 Encounters:  11/09/16 190 lb 12.8 oz (86.5 kg)  10/26/16 188 lb 6.4 oz (85.5 kg)  10/21/16 189 lb 12.8 oz (86.1 kg)    Physical Exam  Constitutional: No distress.  Cardiovascular: Normal rate and regular rhythm.   Pulmonary/Chest: Effort normal and breath sounds normal.  Musculoskeletal: He exhibits edema (1+ pitting edema bilaterally lower extremities).  Neurological: He is alert. Gait normal.  Skin: Skin is warm and dry. He is not diaphoretic.     Assessment/Plan: Please see individual problem list.  Chronic diastolic CHF (congestive heart failure) (HCC) Slight edema bilateral lower extremities and weight is stable. Discussed continuing torsemide. Discussed monitoring his edema. If it worsens or he has weight  gain or develop symptoms he will let us know.  COPD (chronic obstructive pulmonary disease) (HCC) Symptomatically improved. Discussed appropriate use of albuterol and Spiriva. Will monitor his symptoms and if he uses albuterol on a daily basis to let us know as we will need to consider alternative medication.  Diabetes mellitus type 2, uncontrolled ([place]) Improved. Check A1c and CMP. Continue current medications.   Orders Placed This Encounter  Procedures  . HgB A1c  . Comp Met (CMET)   [name], MD [place]

## 2016-11-09 NOTE — Assessment & Plan Note (Addendum)
Slight edema bilateral lower extremities and weight is stable. Discussed continuing torsemide. Discussed monitoring his edema. If it worsens or he has weight gain or develop symptoms he will let us know.

## 2016-11-09 NOTE — Assessment & Plan Note (Signed)
Improved. Check A1c and CMP. Continue current medications.

## 2016-11-10 ENCOUNTER — Other Ambulatory Visit: Payer: Self-pay | Admitting: Family Medicine

## 2016-11-10 DIAGNOSIS — N179 Acute kidney failure, unspecified: Secondary | ICD-10-CM

## 2016-11-10 LAB — HEMOGLOBIN A1C: Hgb A1c MFr Bld: 6.9 % — ABNORMAL HIGH (ref 4.6–6.5)

## 2016-11-10 LAB — COMPREHENSIVE METABOLIC PANEL
ALT: 13 U/L (ref 0–53)
AST: 12 U/L (ref 0–37)
Albumin: 3.9 g/dL (ref 3.5–5.2)
Alkaline Phosphatase: 68 U/L (ref 39–117)
BUN: 25 mg/dL — AB (ref 6–23)
CALCIUM: 8.9 mg/dL (ref 8.4–10.5)
CO2: 26 meq/L (ref 19–32)
CREATININE: 1.58 mg/dL — AB (ref 0.40–1.50)
Chloride: 104 mEq/L (ref 96–112)
GFR: 44.43 mL/min — ABNORMAL LOW (ref >60.00)
GLUCOSE: 236 mg/dL — AB (ref 70–99)
Potassium: 4.5 mEq/L (ref 3.5–5.1)
SODIUM: 139 meq/L (ref 135–145)
Total Bilirubin: 0.4 mg/dL (ref 0.2–1.2)
Total Protein: 6.2 g/dL (ref 6.0–8.3)

## 2016-11-14 ENCOUNTER — Other Ambulatory Visit (INDEPENDENT_AMBULATORY_CARE_PROVIDER_SITE_OTHER): Payer: PPO

## 2016-11-14 DIAGNOSIS — N179 Acute kidney failure, unspecified: Secondary | ICD-10-CM | POA: Diagnosis not present

## 2016-11-14 NOTE — Addendum Note (Signed)
Addended by: Arby Barrette on: 11/14/2016 10:00 AM   Modules accepted: Orders

## 2016-11-15 ENCOUNTER — Telehealth: Payer: Self-pay | Admitting: Family Medicine

## 2016-11-15 LAB — BASIC METABOLIC PANEL
BUN: 23 mg/dL (ref 7–25)
CALCIUM: 9.4 mg/dL (ref 8.6–10.3)
CO2: 23 mmol/L (ref 20–32)
CREATININE: 1.35 mg/dL — AB (ref 0.70–1.11)
Chloride: 103 mmol/L (ref 98–110)
GLUCOSE: 149 mg/dL — AB (ref 65–99)
Potassium: 4.8 mmol/L (ref 3.5–5.3)
SODIUM: 139 mmol/L (ref 135–146)

## 2016-11-15 NOTE — Telephone Encounter (Signed)
Pt daughter call back and wanted to find out the results of albumin. They want to make sure that his kidneys are ok, because he is having issues with urinating. Please advise, thank you!  Call [place] @ [phone]

## 2016-11-15 NOTE — Telephone Encounter (Signed)
Please advise 

## 2016-11-16 NOTE — Telephone Encounter (Signed)
His protein levels are in the normal range with a total protein of 6.2 and an albumin of 3.9. He had a urinalysis earlier this year that had no protein on the dipstick. We'll plan on continuing to monitor his kidney function. We can check urine protein again at his next office visit.

## 2016-11-17 ENCOUNTER — Ambulatory Visit (INDEPENDENT_AMBULATORY_CARE_PROVIDER_SITE_OTHER): Payer: PPO | Admitting: *Deleted

## 2016-11-17 DIAGNOSIS — R350 Frequency of micturition: Secondary | ICD-10-CM

## 2016-11-17 DIAGNOSIS — R32 Unspecified urinary incontinence: Secondary | ICD-10-CM | POA: Diagnosis not present

## 2016-11-17 LAB — MICROSCOPIC EXAMINATION
Bacteria, UA: NONE SEEN
EPITHELIAL CELLS (NON RENAL): NONE SEEN /HPF (ref 0–10)
RBC, UA: NONE SEEN /hpf (ref 0–?)

## 2016-11-17 LAB — URINALYSIS, COMPLETE
BILIRUBIN UA: NEGATIVE
Glucose, UA: NEGATIVE
Ketones, UA: NEGATIVE
Nitrite, UA: NEGATIVE
PH UA: 5 (ref 5.0–7.5)
PROTEIN UA: NEGATIVE
RBC UA: NEGATIVE
SPEC GRAV UA: 1.02 (ref 1.005–1.030)
UUROB: 0.2 mg/dL (ref 0.2–1.0)

## 2016-11-17 NOTE — Telephone Encounter (Signed)
Patient's daughter notified.

## 2016-11-17 NOTE — Progress Notes (Signed)
Showed patient's urinalysis to Dr. Pilar Jarvis, no infection no culture ordered. Patient and his daughter were reassured. Patient has appointment with Dr. Junious Silk in two weeks.

## 2016-11-17 NOTE — Telephone Encounter (Signed)
Patients daughter notified, she states the patient is complaining of having a hard time to start a stream with urination x about 6 weeks.Patient has an appointment with his "prostate doctor" the end of this month.

## 2016-11-17 NOTE — Telephone Encounter (Signed)
I agree that they should see the urologist for that. I would advise that they contact them to let them know what is going on as well. Thanks.

## 2016-11-25 ENCOUNTER — Other Ambulatory Visit: Payer: Self-pay | Admitting: Family Medicine

## 2016-12-02 ENCOUNTER — Other Ambulatory Visit: Payer: PPO

## 2016-12-02 DIAGNOSIS — Z8546 Personal history of malignant neoplasm of prostate: Secondary | ICD-10-CM

## 2016-12-03 LAB — PSA: PROSTATE SPECIFIC AG, SERUM: 2.7 ng/mL (ref 0.0–4.0)

## 2016-12-05 ENCOUNTER — Ambulatory Visit: Payer: PPO | Admitting: Urology

## 2016-12-05 ENCOUNTER — Encounter: Payer: Self-pay | Admitting: Urology

## 2016-12-05 VITALS — BP 128/62 | HR 90 | Ht 63.0 in | Wt 191.0 lb

## 2016-12-05 DIAGNOSIS — C61 Malignant neoplasm of prostate: Secondary | ICD-10-CM

## 2016-12-05 DIAGNOSIS — R3912 Poor urinary stream: Secondary | ICD-10-CM

## 2016-12-05 MED ORDER — ALFUZOSIN HCL ER 10 MG PO TB24: 10.0000 mg | ORAL_TABLET | Freq: Every day | ORAL | 11 refills | Status: DC

## 2016-12-05 NOTE — Progress Notes (Signed)
12/05/2016 3:53 PM   Matthew Miles. [birth year] [id number]  Referring provider: [name], MD [address] [place], [place] [address]  Chief Complaint  Patient presents with  . Prostate Cancer    6 month w/PSA    HPI: F/u --   1) PCa - Pt with a history of prostate cancer and rising PSA after XRT. He had radiation done in "early 1990s or 2000's". He saw Dr. [name]. His PSA was 0.93 Apr 2014 and had risen to 2.14 Jan 2016 -- PSADT = 2.4 years. An Apr 2014 CT A/P was done which showed no mets, gold seeds in prostate.   2) frequency - seen earlier this month with frequency and weak stream. Also, hesitancy. UA clear. He voids with a good stream. Some frequency after diuretics. No gross hematuria.    Today, pt is seen for the above. PSA risen to 2.7, but fortunately remains very slow increase. He has hesitancy and a weak stream. Some frequency. Occasional split stream.    PMH: Past Medical History:  Diagnosis Date  . Allergy   . Arthritis   . CHF (congestive heart failure) ([place])   . Chicken pox   . Cholecystitis   . Colon polyps   . COPD (chronic obstructive pulmonary disease) ([place])   . Coronary artery disease   . Emphysema of lung ([place])   . GERD (gastroesophageal reflux disease)   . Heart murmur   . Hematemesis/vomiting blood 04/10/11  . Hypercholesterolemia   . Mild hypertension   . Pancreatitis   . Prostate cancer ([place])   . Prostate cancer [facility])    radiation therapy   . Smoker   . Ulcer   . Upper GI bleed 1980    Surgical History: Past Surgical History:  Procedure Laterality Date  . APPENDECTOMY    . CARDIAC CATHETERIZATION  May 2002 and Feb 2013   [facility]; no stents   . CATARACT EXTRACTION    . CHOLECYSTECTOMY    . CIRCUMCISION    . COLONOSCOPY    . HEMORRHOID SURGERY    . SP CHOLECYSTOMY    . STOMACH SURGERY     bleeding ulcers, followed by Dr. [name]    Home Medications:  Allergies as of 12/05/2016      Reactions   Sulfa  Antibiotics    GI upset   Tradjenta [linagliptin] Other (See Comments)   Hair loss      Medication List       Accurate as of 12/05/16  3:53 PM. Always use your most recent med list.          aspirin 81 MG tablet Take 81 mg by mouth daily.   B-12 1000 MCG Tbcr Take 1 tablet by mouth daily.   carvedilol 3.125 MG tablet Commonly known as:  COREG Take 1 tablet (3.125 mg total) by mouth 2 (two) times daily.   glipiZIDE 10 MG 24 hr tablet Commonly known as:  GLUCOTROL XL Take 1 tablet (10 mg total) by mouth 2 (two) times daily.   glucose blood test strip One touch  berio  test strips. Use to check blood sugar two times a day. Dx. E11.9   metFORMIN 500 MG 24 hr tablet Commonly known as:  GLUCOPHAGE XR Take 2 tablets (1,000 mg total) by mouth 2 (two) times daily.   metFORMIN 500 MG tablet Commonly known as:  GLUCOPHAGE   nitroGLYCERIN 0.4 MG SL tablet Commonly known as:  NITROSTAT Place 1 tablet (0.4 mg  total) under the tongue every 5 (five) minutes as needed.   pantoprazole 20 MG tablet Commonly known as:  PROTONIX Take 1 tablet (20 mg total) by mouth daily.   pantoprazole 40 MG tablet Commonly known as:  PROTONIX TAKE 1 TABLET(40 MG) BY MOUTH DAILY   potassium chloride SA 20 MEQ tablet Commonly known as:  K-DUR,KLOR-CON Take 1 tablet (20 mEq total) by mouth daily.   pravastatin 40 MG tablet Commonly known as:  PRAVACHOL Take 1 tablet (40 mg total) by mouth daily.   PROAIR HFA 108 (90 Base) MCG/ACT inhaler Generic drug:  albuterol INHALE 2 PUFFS INTO THE LUNGS EVERY 6 HOURS AS NEEDED FOR WHEEZING OR SHORTNESS OF BREATH   sitaGLIPtin 50 MG tablet Commonly known as:  JANUVIA Take 1 tablet (50 mg total) by mouth daily.   SPIRIVA RESPIMAT 2.5 MCG/ACT Aers Generic drug:  Tiotropium Bromide Monohydrate INHALE 2 PUFFS INTO THE LUNGS DAILY   torsemide 10 MG tablet Commonly known as:  DEMADEX Take 1 tablet (10 mg total) by mouth 2 (two) times daily as needed.     traMADol 50 MG tablet Commonly known as:  ULTRAM Take 1 tablet (50 mg total) by mouth every 8 (eight) hours as needed.       Allergies:  Allergies  Allergen Reactions  . Sulfa Antibiotics     GI upset  . Tradjenta [Linagliptin] Other (See Comments)    Hair loss    Family History: Family History  Problem Relation Age of Onset  . Prostate cancer Father   . Liver cancer Brother   . Prostate cancer Brother   . Emphysema Sister        smoker    Social History:  reports that he quit smoking about 5 years ago. His smoking use included Cigarettes. He has a 65.00 pack-year smoking history. He has quit using smokeless tobacco. His smokeless tobacco use included Chew. He reports that he drinks alcohol. He reports that he does not use drugs.  ROS: UROLOGY Frequent Urination?: No Hard to postpone urination?: No Burning/pain with urination?: No Get up at night to urinate?: Yes Leakage of urine?: No Urine stream starts and stops?: No Trouble starting stream?: Yes Do you have to strain to urinate?: No Blood in urine?: No Urinary tract infection?: No Sexually transmitted disease?: No Injury to kidneys or bladder?: No Painful intercourse?: No Weak stream?: No Erection problems?: No Penile pain?: No  Gastrointestinal Nausea?: No Vomiting?: No Indigestion/heartburn?: No Diarrhea?: No Constipation?: No  Constitutional Fever: No Night sweats?: No Weight loss?: No Fatigue?: No  Skin Skin rash/lesions?: No Itching?: No  Eyes Blurred vision?: No Double vision?: No  Ears/Nose/Throat Sore throat?: No Sinus problems?: No  Hematologic/Lymphatic Swollen glands?: No Easy bruising?: No  Cardiovascular Leg swelling?: Yes Chest pain?: No  Respiratory Cough?: No Shortness of breath?: Yes  Endocrine Excessive thirst?: Yes  Musculoskeletal Back pain?: No Joint pain?: No  Neurological Headaches?: No Dizziness?: No  Psychologic Depression?: No Anxiety?:  No  Physical Exam: BP 128/62   Pulse 90   Ht 5\' 3"  (1.6 m)   Wt 86.6 kg (191 lb)   BMI 33.83 kg/m   Constitutional:  Alert and oriented, No acute distress. HEENT: [place] AT, moist mucus membranes.  Trachea midline, no masses. Cardiovascular: No clubbing, cyanosis, or edema. Respiratory: Normal respiratory effort, no increased work of breathing. GI: Abdomen is soft, nontender, nondistended, no abdominal masses Skin: No rashes, bruises or suspicious lesions. Neurologic: Grossly intact, no focal deficits, moving  all 4 extremities. Psychiatric: Normal mood and affect.  Laboratory Data: Lab Results  Component Value Date   WBC 6.2 08/23/2016   HGB 11.8 (L) 08/23/2016   HCT 34.9 (L) 08/23/2016   MCV 86.1 08/23/2016   PLT 119 (L) 08/23/2016    Lab Results  Component Value Date   CREATININE 1.35 (H) 11/14/2016    Lab Results  Component Value Date   PSA 2.50 02/04/2016   PSA 0.93 07/11/2012    No results found for: TESTOSTERONE  Lab Results  Component Value Date   HGBA1C 6.9 (H) 11/09/2016    Urinalysis    Component Value Date/Time   COLORURINE Yellow 08/09/2012 1900   APPEARANCEUR Clear 11/17/2016 1415   LABSPEC 1.017 08/09/2012 1900   PHURINE 5.0 08/09/2012 1900   GLUCOSEU Negative 11/17/2016 1415   GLUCOSEU Negative 08/09/2012 1900   HGBUR Negative 08/09/2012 1900   BILIRUBINUR Negative 11/17/2016 1415   BILIRUBINUR Negative 08/09/2012 1900   KETONESUR Negative 08/09/2012 1900   PROTEINUR Negative 11/17/2016 1415   PROTEINUR 30 mg/dL 08/09/2012 1900   UROBILINOGEN 0.2 10/08/2013 1153   NITRITE Negative 11/17/2016 1415   NITRITE Negative 08/09/2012 1900   LEUKOCYTESUR Trace (A) 11/17/2016 1415   LEUKOCYTESUR 2+ 08/09/2012 1900      Assessment & Plan:    1) PCa - continue to monitor PSa. Slow rise. Discussed nature r/b of surveillance vs early or late ADT as well as biopsy and local treatment again (friend asked about it). We'll continue surveillance.   2)  Weak stream - discussed continue surveillance or trial of alfuzosin. Discussed nature r/b of alpha blockers and he elects to start.   See in 6 mo with PSA prior.   There are no diagnoses linked to this encounter.  No Follow-up on file.  Festus Aloe, [facility] [address], [place], [place] [address] [phone]

## 2016-12-07 ENCOUNTER — Other Ambulatory Visit: Payer: Self-pay | Admitting: Family Medicine

## 2016-12-07 DIAGNOSIS — J449 Chronic obstructive pulmonary disease, unspecified: Secondary | ICD-10-CM

## 2016-12-16 ENCOUNTER — Ambulatory Visit (INDEPENDENT_AMBULATORY_CARE_PROVIDER_SITE_OTHER): Payer: PPO | Admitting: Internal Medicine

## 2016-12-16 ENCOUNTER — Encounter: Payer: Self-pay | Admitting: Internal Medicine

## 2016-12-16 VITALS — BP 128/78 | HR 97 | Resp 16 | Ht 63.0 in | Wt 189.0 lb

## 2016-12-16 DIAGNOSIS — J449 Chronic obstructive pulmonary disease, unspecified: Secondary | ICD-10-CM

## 2016-12-16 NOTE — Progress Notes (Signed)
[facility] Consultation      Assessment and Plan:  The patient is an 83 year old male with dyspnea on exertion due to COPD.  COPD/chronic bronchitis, with dyspnea on exertion. -Patient has mild dyspnea on exertion, which appears to be compensated. He appears to be doing well with his current regimen of Spiriva Respimat once daily, he continues to be active and independent, mows his own yard on a riding mower and goes to the grocery store. -Continue Spiriva Respimat once daily, also discussed with the patient that I recommend that he continue to try to be physically active as he has been. -Patient is up-to-date with Pneumovax and Prevnar 13 as a 2016, he recommended a flu vaccine this winter, which she tells me is going to get at his primary care physician's office.  Date: 12/16/2016  MRN# [id number] Bill Yohn. 04-Nov-1928    [name] is a 83 y.o. old male seen in consultation for chief complaint of:    Chief Complaint  Patient presents with  . Advice Only    Pt referred by Dr. [name] but has been seen in 2013 by Dr. Lake Bells.  Marland Kitchen COPD    pt has sob with exhertion/ pt denies cough, chest tightness    HPI:   He has been having dyspnea on exertion, he smoked 1 ppd until 6 years ago. He can walk on level ground but if he walks on an incline he has to stop. He is using spiriva respimat 2 puffs once daily. He has an albuterol MDI which he has not had to use in the past week.  He denies coughing or significant mucus production.   He goes to [employer] twice per week and is able to walk around with a cart. He uses a riding mower once to twice per week and does not find that he is limited by breathing. He has had no exacerbations.   He appears up to date with pneumovax and prevnar.    PMHX:   Past Medical History:  Diagnosis Date  . Allergy   . Arthritis   . CHF (congestive heart failure) ([place])   . Chicken pox   . Cholecystitis   . Colon polyps     . COPD (chronic obstructive pulmonary disease) ([place])   . Coronary artery disease   . Emphysema of lung ([place])   . GERD (gastroesophageal reflux disease)   . Heart murmur   . Hematemesis/vomiting blood 04/10/11  . Hypercholesterolemia   . Mild hypertension   . Pancreatitis   . Prostate cancer ([place])   . Prostate cancer [facility])    radiation therapy   . Smoker   . Ulcer   . Upper GI bleed 1980   Surgical Hx:  Past Surgical History:  Procedure Laterality Date  . APPENDECTOMY    . CARDIAC CATHETERIZATION  May 2002 and Feb 2013   [facility]; no stents   . CATARACT EXTRACTION    . CHOLECYSTECTOMY    . CIRCUMCISION    . COLONOSCOPY    . HEMORRHOID SURGERY    . SP CHOLECYSTOMY    . STOMACH SURGERY     bleeding ulcers, followed by Dr. [name]   Family Hx:  Family History  Problem Relation Age of Onset  . Prostate cancer Father   . Liver cancer Brother   . Prostate cancer Brother   . Emphysema Sister        smoker   Social Hx:   Social History  Substance Use  Topics  . Smoking status: Former Smoker    Packs/day: 1.00    Years: 65.00    Types: Cigarettes    Quit date: 04/10/2011  . Smokeless tobacco: Former Systems developer    Types: Chew  . Alcohol use Yes     Comment: 1 beer rarely   Medication:    Current Outpatient Prescriptions:  .  alfuzosin (UROXATRAL) 10 MG 24 hr tablet, Take 1 tablet (10 mg total) by mouth daily with breakfast., Disp: 30 tablet, Rfl: 11 .  aspirin 81 MG tablet, Take 81 mg by mouth daily. , Disp: , Rfl:  .  carvedilol (COREG) 3.125 MG tablet, Take 1 tablet (3.125 mg total) by mouth 2 (two) times daily., Disp: 180 tablet, Rfl: 3 .  Cyanocobalamin (B-12) 1000 MCG TBCR, Take 1 tablet by mouth daily. , Disp: , Rfl:  .  glipiZIDE (GLUCOTROL XL) 10 MG 24 hr tablet, Take 1 tablet (10 mg total) by mouth 2 (two) times daily., Disp: 180 tablet, Rfl: 3 .  glucose blood test strip, One touch  berio  test strips. Use to check blood sugar two times a day. Dx. E11.9 (Patient  taking differently: One touch  verio  test strips. Use to check blood sugar two times a day. Dx. E11.9), Disp: 200 each, Rfl: 11 .  metFORMIN (GLUCOPHAGE XR) 500 MG 24 hr tablet, Take 2 tablets (1,000 mg total) by mouth 2 (two) times daily., Disp: 360 tablet, Rfl: 1 .  nitroGLYCERIN (NITROSTAT) 0.4 MG SL tablet, Place 1 tablet (0.4 mg total) under the tongue every 5 (five) minutes as needed., Disp: 25 tablet, Rfl: 0 .  pantoprazole (PROTONIX) 20 MG tablet, Take 1 tablet (20 mg total) by mouth daily., Disp: 92 tablet, Rfl: 0 .  potassium chloride SA (K-DUR,KLOR-CON) 20 MEQ tablet, Take 1 tablet (20 mEq total) by mouth daily., Disp: 90 tablet, Rfl: 3 .  pravastatin (PRAVACHOL) 40 MG tablet, Take 1 tablet (40 mg total) by mouth daily., Disp: 90 tablet, Rfl: 3 .  PROAIR HFA 108 (90 Base) MCG/ACT inhaler, INHALE 2 PUFFS INTO THE LUNGS EVERY 6 HOURS AS NEEDED FOR WHEEZING OR SHORTNESS OF BREATH, Disp: 8.5 g, Rfl: 0 .  sitaGLIPtin (JANUVIA) 50 MG tablet, Take 1 tablet (50 mg total) by mouth daily., Disp: 90 tablet, Rfl: 3 .  SPIRIVA RESPIMAT 2.5 MCG/ACT AERS, INHALE 2 PUFFS INTO THE LUNGS DAILY, Disp: 4 g, Rfl: 0 .  torsemide (DEMADEX) 10 MG tablet, Take 1 tablet (10 mg total) by mouth 2 (two) times daily as needed. (Patient taking differently: Take 10-20 mg by mouth 2 (two) times daily. 20 mg every morning and 10 mg every evening (skips on Sundays mornings).), Disp: 180 tablet, Rfl: 3 .  traMADol (ULTRAM) 50 MG tablet, Take 1 tablet (50 mg total) by mouth every 8 (eight) hours as needed., Disp: 30 tablet, Rfl: 0   Allergies:  Sulfa antibiotics and Tradjenta [linagliptin]  Review of Systems: Gen:  Denies  fever, sweats, chills HEENT: Denies blurred vision, double vision. bleeds, sore throat Cvc:  No dizziness, chest pain. Resp:   Denies cough or sputum production, shortness of breath Gi: Denies swallowing difficulty, stomach pain. Gu:  Denies bladder incontinence, burning urine Ext:   No Joint pain,  stiffness. Skin: No skin rash,  hives  Endoc:  No polyuria, polydipsia. Psych: No depression, insomnia. Other:  All other systems were reviewed with the patient and were negative other that what is mentioned in the HPI.   Physical Examination:  VS: BP 128/78 (BP Location: Left Arm, Cuff Size: Normal)   Pulse 97   Resp 16   Ht 5\' 3"  (1.6 m)   Wt 189 lb (85.7 kg)   SpO2 100%   BMI 33.48 kg/m   General Appearance: No distress  Neuro:without focal findings,  speech normal,  HEENT: PERRLA, EOM intact.   Pulmonary: normal breath sounds, No wheezing. Decreased air entry bilaterally. CardiovascularNormal S1,S2.  No m/r/g.   Abdomen: Benign, Soft, non-tender. Renal:  No costovertebral tenderness  GU:  No performed at this time. Endoc: No evident thyromegaly, no signs of acromegaly. Skin:   warm, no rashes, no ecchymosis  Extremities: normal, no cyanosis, clubbing.  Other findings:    LABORATORY PANEL:   CBC No results for input(s): WBC, HGB, HCT, PLT in the last 168 hours. ------------------------------------------------------------------------------------------------------------------  Chemistries  No results for input(s): NA, K, CL, CO2, GLUCOSE, BUN, CREATININE, CALCIUM, MG, AST, ALT, ALKPHOS, BILITOT in the last 168 hours.  Invalid input(s): GFRCGP ------------------------------------------------------------------------------------------------------------------  Cardiac Enzymes No results for input(s): TROPONINI in the last 168 hours. ------------------------------------------------------------  RADIOLOGY:  No results found.     Thank  you for the consultation and for allowing [facility] to assist in the care of your patient. Our recommendations are noted above.  Please contact us if we can be of further service.   Marda Stalker, MD.  Board Certified in Internal Medicine, Pulmonary Medicine, [place], and Sleep  Medicine.  [facility] Office Number: [phone]  Patricia Pesa, M.D.  Merton Border, M.D  12/16/2016

## 2016-12-16 NOTE — Patient Instructions (Addendum)
--  Continue using spiriva respimat.  --Be sure to get your flu shot this year.  --Continue to be active.

## 2016-12-26 ENCOUNTER — Ambulatory Visit: Payer: PPO | Admitting: Podiatry

## 2016-12-26 ENCOUNTER — Other Ambulatory Visit: Payer: Self-pay | Admitting: Family Medicine

## 2016-12-26 DIAGNOSIS — E113492 Type 2 diabetes mellitus with severe nonproliferative diabetic retinopathy without macular edema, left eye: Secondary | ICD-10-CM

## 2016-12-26 DIAGNOSIS — E1165 Type 2 diabetes mellitus with hyperglycemia: Principal | ICD-10-CM

## 2016-12-26 NOTE — Telephone Encounter (Signed)
Last OV 11/09/16 last filled Pantoprazole 10/26/16 by Dr.Anna Metformin 07/04/16 360 1rf

## 2017-01-03 ENCOUNTER — Other Ambulatory Visit: Payer: Self-pay | Admitting: Family Medicine

## 2017-01-03 DIAGNOSIS — J449 Chronic obstructive pulmonary disease, unspecified: Secondary | ICD-10-CM

## 2017-01-31 ENCOUNTER — Other Ambulatory Visit: Payer: Self-pay | Admitting: Family Medicine

## 2017-01-31 DIAGNOSIS — J449 Chronic obstructive pulmonary disease, unspecified: Secondary | ICD-10-CM

## 2017-02-08 DIAGNOSIS — E109 Type 1 diabetes mellitus without complications: Secondary | ICD-10-CM | POA: Insufficient documentation

## 2017-02-08 NOTE — Progress Notes (Signed)
Cardiology Office Note  Date:  02/09/2017   ID:  Matthew Miles., DOB [birth year], MRN [id number]  PCP:  [name], MD   Chief Complaint  Patient presents with  . other    6 month f/u no complaints today. Meds reviewed verbally with pt.    HPI:  Matthew Miles is a 83 yo-old gentleman with history of  DM II, HBA1C 7.5 coronary artery disease (occluded LAD),  moderate pulmonary hypertension in early 2013,  long history of smoking for 60 years, stopped more than 5 years ago bronchitis requiring several courses of antibiotics at the end of 2012,  December for hematemesis and gastric ulcer seen on EGD,  nonsustained VT per the notes,  acute renal failure who presents for followup Of his coronary artery disease Ejection fraction 55% in 2015  In follow-up he reports that he feels well Denies any chest pain He does have chronic shortness of breath on exertion No regular exercise program  Takes torsemide 20 in the AM, 10 in the PM chronic mild leg edema, nonpitting  Lab work reviewed with him in detail HBA1C 9, down to 7.7 down 6.9 recently LDL 80 in 07/2015  EKG personally reviewed by myself on todays visit Shows normal sinus rhythm rate 99 bpm left anterior fascicular block, old anterior MI  Other past medical history reviewed He reports that he was recently In the hospital, 07/30/2016 GERD symptoms Ate cabbage that day Better with GI cocktail,Ruled out for [facility] records reviewed with the patient in detail Changed to protonix At discharge    Lab work reviewed with him Total chol 130s, LDL 80  Chronic low back pain Reports that he had a bleeding ulcer 4 years ago at that time stop smoking Last echocardiogram in 2015 showing normal ejection fraction, right heart pressures 37 mmHg  left and right heart cath done in February 2013 showing pulmonary hypertension, stable severe coronary artery disease  Normal ejection fraction  Right heart  catheterization showed significant fluid overload with wedge pressure of 24, moderate pulmonary hypertension. Catheterization showed occluded LAD which was old, no other significant stenoses requiring intervention.   PMH:   has a past medical history of Allergy; Arthritis; CHF (congestive heart failure) ([place]); Chicken pox; Cholecystitis; Colon polyps; COPD (chronic obstructive pulmonary disease) ([place]); Coronary artery disease; Emphysema of lung ([place]); GERD (gastroesophageal reflux disease); Heart murmur; Hematemesis/vomiting blood (04/10/11); Hypercholesterolemia; Mild hypertension; Pancreatitis; Prostate cancer ([place]); Prostate cancer ([place]); Smoker; Ulcer; and Upper GI bleed (1980).  PSH:    Past Surgical History:  Procedure Laterality Date  . APPENDECTOMY    . CARDIAC CATHETERIZATION  May 2002 and Feb 2013   [facility]; no stents   . CATARACT EXTRACTION    . CHOLECYSTECTOMY    . CIRCUMCISION    . COLONOSCOPY    . HEMORRHOID SURGERY    . SP CHOLECYSTOMY    . STOMACH SURGERY     bleeding ulcers, followed by Dr. [name]    Current Outpatient Prescriptions  Medication Sig Dispense Refill  . alfuzosin (UROXATRAL) 10 MG 24 hr tablet Take 1 tablet (10 mg total) by mouth daily with breakfast. 30 tablet 11  . aspirin 81 MG tablet Take 81 mg by mouth daily.     . carvedilol (COREG) 3.125 MG tablet Take 1 tablet (3.125 mg total) by mouth 2 (two) times daily. 180 tablet 3  . Cyanocobalamin (B-12) 1000 MCG TBCR Take 1 tablet by mouth daily.     Marland Kitchen glipiZIDE (  GLUCOTROL XL) 10 MG 24 hr tablet Take 1 tablet (10 mg total) by mouth 2 (two) times daily. 180 tablet 3  . glucose blood test strip One touch  berio  test strips. Use to check blood sugar two times a day. Dx. E11.9 (Patient taking differently: One touch  verio  test strips. Use to check blood sugar two times a day. Dx. E11.9) 200 each 11  . metFORMIN (GLUCOPHAGE-XR) 500 MG 24 hr tablet TAKE 2 TABLETS(1000 MG) BY MOUTH TWICE DAILY 360 tablet 3  .  nitroGLYCERIN (NITROSTAT) 0.4 MG SL tablet Place 1 tablet (0.4 mg total) under the tongue every 5 (five) minutes as needed. 25 tablet 0  . pantoprazole (PROTONIX) 20 MG tablet Take 1 tablet (20 mg total) by mouth daily. 30 tablet 2  . potassium chloride SA (K-DUR,KLOR-CON) 20 MEQ tablet Take 1 tablet (20 mEq total) by mouth daily. 90 tablet 3  . pravastatin (PRAVACHOL) 40 MG tablet Take 1 tablet (40 mg total) by mouth daily. 90 tablet 3  . PROAIR HFA 108 (90 Base) MCG/ACT inhaler INHALE 2 PUFFS INTO THE LUNGS EVERY 6 HOURS AS NEEDED FOR WHEEZING OR SHORTNESS OF BREATH 8.5 g 0  . sitaGLIPtin (JANUVIA) 50 MG tablet Take 1 tablet (50 mg total) by mouth daily. 90 tablet 3  . SPIRIVA RESPIMAT 2.5 MCG/ACT AERS INHALE 2 PUFFS INTO THE LUNGS DAILY 4 g 0  . torsemide (DEMADEX) 10 MG tablet Take 1 tablet (10 mg total) by mouth 2 (two) times daily as needed. (Patient taking differently: Take 10-20 mg by mouth 2 (two) times daily. 20 mg every morning and 10 mg every evening (skips on Sundays mornings).) 180 tablet 3  . traMADol (ULTRAM) 50 MG tablet Take 1 tablet (50 mg total) by mouth every 8 (eight) hours as needed. 30 tablet 0   No current facility-administered medications for this visit.      Allergies:   Sulfa antibiotics and Tradjenta [linagliptin]   Social History:  The patient  reports that he quit smoking about 5 years ago. His smoking use included Cigarettes. He has a 65.00 pack-year smoking history. He has quit using smokeless tobacco. His smokeless tobacco use included Chew. He reports that he drinks alcohol. He reports that he does not use drugs.   Family History:   family history includes Emphysema in his sister; Liver cancer in his brother; Prostate cancer in his brother and father.    Review of Systems: Review of Systems  Constitutional: Negative.   Respiratory: Negative.   Gastrointestinal: Negative.   Musculoskeletal: Positive for falls and joint pain.  Neurological: Negative.    Psychiatric/Behavioral: Negative.   All other systems reviewed and are negative.   PHYSICAL EXAM: VS:  BP (!) 120/44 (BP Location: Left Arm, Patient Position: Sitting, Cuff Size: Normal)   Pulse 99   Ht 5\' 2"  (1.575 m)   Wt 189 lb 8 oz (86 kg)   BMI 34.66 kg/m  , BMI Body mass index is 34.66 kg/m. GEN: Well nourished, well developed, in no acute distress , obese HEENT: normal  Neck: no JVD, carotid bruits, or masses Cardiac: RRR; no murmurs, rubs, or gallops,trace nonpitting edema b/l  Respiratory: Mildly decreased breath sounds throughout,  normal work of breathing GI: soft, nontender, nondistended, + BS MS: no deformity or atrophy  Skin: warm and dry, no rash Neuro:  Strength and sensation are intact Psych: euthymic mood, full affect    Recent Labs: 08/23/2016: Hemoglobin 11.8; Platelets 119 11/09/2016: ALT  13 11/14/2016: BUN 23; Creat 1.35; Potassium 4.8; Sodium 139    Lipid Panel Lab Results  Component Value Date   CHOL 135 08/05/2015   HDL 35.10 (L) 08/05/2015   LDLCALC 68 01/22/2015   TRIG 310.0 (H) 08/05/2015      Wt Readings from Last 3 Encounters:  02/09/17 189 lb 8 oz (86 kg)  12/16/16 189 lb (85.7 kg)  12/05/16 191 lb (86.6 kg)       ASSESSMENT AND PLAN:   Pulmonary hypertension ([place]) - Plan: EKG 12-Lead Last echo 10/2013, ejection fraction greater than 55% Mildly elevated right heart pressures, on torsemide  Chronic diastolic CHF (congestive heart failure) ([place]) - Plan: EKG 12-Lead Appears euvolemic, recommended he stay on his torsemide Potassium stable, creatinine 1.35  Essential hypertension - Plan: EKG 12-Lead In an effort to slow his heart rate which is elevated today recommended he increase Coreg up to 6.25 mg twice daily  Coronary artery disease involving native coronary artery of native heart without angina pectoris - Plan: EKG 12-Lead Currently with no symptoms of angina. No further workup at this time. Continue current medication  regimen.  Mixed hyperlipidemia - Plan: EKG 12-Lead Recommended we need repeat lipid panel This could be done through primary care or our office If LDL continues to run above 70, could add Zetia  Uncontrolled type 2 diabetes mellitus with other circulatory complication, without long-term current use of insulin (HCC) - Hemoglobin A1c dramatically improved over the past year 9 down to 6.9  Chronic obstructive pulmonary disease, unspecified COPD type ([place]) - Plan: EKG 12-Lead Stable disease, No recent exacerbation   GERD Stable symptoms   Total encounter time more than 25 minutes  Greater than 50% was spent in counseling and coordination of care with the patient   Disposition:   F/U  6 months   Orders Placed This Encounter  Procedures  . EKG 12-Lead     Signed, Esmond Plants, M.D., Ph.D. 02/09/2017  [place], [place]

## 2017-02-09 ENCOUNTER — Encounter: Payer: Self-pay | Admitting: Cardiovascular Disease

## 2017-02-09 ENCOUNTER — Ambulatory Visit (INDEPENDENT_AMBULATORY_CARE_PROVIDER_SITE_OTHER): Payer: PPO | Admitting: Cardiovascular Disease

## 2017-02-09 VITALS — BP 120/44 | HR 99 | Ht 62.0 in | Wt 189.5 lb

## 2017-02-09 DIAGNOSIS — I5032 Chronic diastolic (congestive) heart failure: Secondary | ICD-10-CM | POA: Diagnosis not present

## 2017-02-09 DIAGNOSIS — I10 Essential (primary) hypertension: Secondary | ICD-10-CM | POA: Diagnosis not present

## 2017-02-09 DIAGNOSIS — E1059 Type 1 diabetes mellitus with other circulatory complications: Secondary | ICD-10-CM

## 2017-02-09 DIAGNOSIS — E782 Mixed hyperlipidemia: Secondary | ICD-10-CM

## 2017-02-09 DIAGNOSIS — I25118 Atherosclerotic heart disease of native coronary artery with other forms of angina pectoris: Secondary | ICD-10-CM

## 2017-02-09 DIAGNOSIS — I272 Pulmonary hypertension, unspecified: Secondary | ICD-10-CM

## 2017-02-09 DIAGNOSIS — J432 Centrilobular emphysema: Secondary | ICD-10-CM | POA: Diagnosis not present

## 2017-02-09 MED ORDER — CARVEDILOL 6.25 MG PO TABS: 6.2500 mg | ORAL_TABLET | Freq: Two times a day (BID) | ORAL | 3 refills | Status: DC

## 2017-02-09 NOTE — Patient Instructions (Signed)
Medication Instructions:   Please increase the coreg/carvedilol up to 6.25 mg twice a day  Labwork:  No new labs needed  Testing/Procedures:  No further testing at this time   Follow-Up: It was a pleasure seeing you in the office today. Please call us if you have new issues that need to be addressed before your next appt.  [phone]  Your physician wants you to follow-up in: 6 months.  You will receive a reminder letter in the mail two months in advance. If you don't receive a letter, please call our office to schedule the follow-up appointment.  If you need a refill on your cardiac medications before your next appointment, please call your pharmacy.

## 2017-02-10 ENCOUNTER — Ambulatory Visit (INDEPENDENT_AMBULATORY_CARE_PROVIDER_SITE_OTHER): Payer: PPO | Admitting: Family Medicine

## 2017-02-10 ENCOUNTER — Encounter: Payer: Self-pay | Admitting: Family Medicine

## 2017-02-10 VITALS — BP 160/60 | HR 105 | Temp 98.2°F | Wt 191.2 lb

## 2017-02-10 DIAGNOSIS — N400 Enlarged prostate without lower urinary tract symptoms: Secondary | ICD-10-CM | POA: Insufficient documentation

## 2017-02-10 DIAGNOSIS — I10 Essential (primary) hypertension: Secondary | ICD-10-CM

## 2017-02-10 DIAGNOSIS — G8929 Other chronic pain: Secondary | ICD-10-CM

## 2017-02-10 DIAGNOSIS — N401 Enlarged prostate with lower urinary tract symptoms: Secondary | ICD-10-CM

## 2017-02-10 DIAGNOSIS — M545 Low back pain: Secondary | ICD-10-CM

## 2017-02-10 DIAGNOSIS — E1165 Type 2 diabetes mellitus with hyperglycemia: Secondary | ICD-10-CM

## 2017-02-10 DIAGNOSIS — E782 Mixed hyperlipidemia: Secondary | ICD-10-CM | POA: Diagnosis not present

## 2017-02-10 NOTE — Patient Instructions (Addendum)
Nice to see you. Please monitor for lightheadedness with the increased dose of carvedilol. Please monitor your blood pressure and if it is running less than 110/60 or you develop lightheadedness you need to let us know. Please monitor your back and if it starts to bother you please let us know. We'll have you return for lab work next week. This should be fasting. Please do not take your glipizide until you eats that day.

## 2017-02-10 NOTE — Assessment & Plan Note (Signed)
Well-controlled. Return for A1c.

## 2017-02-10 NOTE — Assessment & Plan Note (Signed)
Doing well on current medication.

## 2017-02-10 NOTE — Progress Notes (Signed)
  Matthew Rumps, MD Phone: [phone]  Matthew Miles. is a 83 y.o. male who presents today for follow-up.  Diabetes: Running between 100-120. Taking glipizide, metformin, Januvia. Some polydipsia though no polyuria. Rare hypoglycemia if he forgets to eat and takes his glipizide.  Hypertension: Fairly well controlled. Taking Coreg and torsemide. No chest pain. Shortness of breath is chronic and stable. No edema. Notes he saw his cardiologist yesterday and the increased his Coreg given slight elevation in heart rate has it was felt this may help him with his breathing. He has not started this yet.  Low-back pain: Notes chronic issues with this. [place] He would rest and it would get better. Rare Advil use. No incontinence or weakness or weakness.  BPH: Notes medications and urology prescribed as been very helpful. Occasionally has slow flow. Much improved.  PMH: Former smoker   ROS see history of present illness  Objective  Physical Exam Vitals:   02/10/17 1323  BP: (!) 160/60  Pulse: (!) 105  Temp: 98.2 F (36.8 C)  SpO2: 91%    BP Readings from Last 3 Encounters:  02/10/17 (!) 160/60  02/09/17 (!) 120/44  12/16/16 128/78   Wt Readings from Last 3 Encounters:  02/10/17 191 lb 3.2 oz (86.7 kg)  02/09/17 189 lb 8 oz (86 kg)  12/16/16 189 lb (85.7 kg)    Physical Exam  Constitutional: No distress.  Cardiovascular: Regular rhythm and normal heart sounds.  Tachycardia present.   Pulmonary/Chest: Effort normal and breath sounds normal.  Musculoskeletal: He exhibits no edema.  No midline spine tenderness, no midline spine step-off, no muscular back tenderness  Neurological: He is alert. Gait normal.  5/5 strength bilateral quads, hamstrings, plantar flexion, and dorsiflexion, sensation light touch intact in bilateral lower extremities, absent patellar reflexes bilaterally  Skin: Skin is warm and dry. He is not diaphoretic.      Assessment/Plan: Please see individual problem list.  Diabetes mellitus type 2, uncontrolled ([place]) Well-controlled. Return for A1c.  Essential hypertension Has been relatively well controlled. Coreg recently increased given heart rate. Advised patient be very careful with this and if he starts to get lightheaded or if his blood pressure starts to drop below 110/60 at home he needs to let us know.  BPH (benign prostatic hyperplasia) Doing well on current medication.  Chronic low back pain Chronic intermittent issues. Has resolved at this time. Monitor for recurrence.   Orders Placed This Encounter  Procedures  . Lipid panel    Standing Status:   Future    Standing Expiration Date:   02/10/2018  . Comp Met (CMET)    Standing Status:   Future    Standing Expiration Date:   02/10/2018  . HgB A1c    Standing Status:   Future    Standing Expiration Date:   02/10/2018  . Urine Microalbumin w/creat. ratio    Standing Status:   Future    Standing Expiration Date:   02/10/2018   Matthew Rumps, MD [place]

## 2017-02-10 NOTE — Assessment & Plan Note (Signed)
Chronic intermittent issues. Has resolved at this time. Monitor for recurrence.

## 2017-02-10 NOTE — Assessment & Plan Note (Signed)
Has been relatively well controlled. Coreg recently increased given heart rate. Advised patient be very careful with this and if he starts to get lightheaded or if his blood pressure starts to drop below 110/60 at home he needs to let us know.

## 2017-02-17 ENCOUNTER — Other Ambulatory Visit (INDEPENDENT_AMBULATORY_CARE_PROVIDER_SITE_OTHER): Payer: PPO

## 2017-02-17 DIAGNOSIS — E1165 Type 2 diabetes mellitus with hyperglycemia: Secondary | ICD-10-CM

## 2017-02-17 DIAGNOSIS — E782 Mixed hyperlipidemia: Secondary | ICD-10-CM | POA: Diagnosis not present

## 2017-02-17 LAB — LIPID PANEL
CHOL/HDL RATIO: 4
CHOLESTEROL: 121 mg/dL (ref 0–200)
HDL: 27.3 mg/dL — ABNORMAL LOW (ref >39.00)
LDL CALC: 67 mg/dL (ref 0–99)
NonHDL: 94.18
TRIGLYCERIDES: 136 mg/dL (ref 0.0–149.0)
VLDL: 27.2 mg/dL (ref 0.0–40.0)

## 2017-02-17 LAB — COMPREHENSIVE METABOLIC PANEL
ALBUMIN: 4.1 g/dL (ref 3.5–5.2)
ALK PHOS: 59 U/L (ref 39–117)
ALT: 11 U/L (ref 0–53)
AST: 10 U/L (ref 0–37)
BUN: 22 mg/dL (ref 6–23)
CALCIUM: 9.5 mg/dL (ref 8.4–10.5)
CO2: 27 mEq/L (ref 19–32)
Chloride: 102 mEq/L (ref 96–112)
Creatinine, Ser: 1.19 mg/dL (ref 0.40–1.50)
GFR: 61.58 mL/min (ref >60.00)
Glucose, Bld: 129 mg/dL — ABNORMAL HIGH (ref 70–99)
POTASSIUM: 4.6 meq/L (ref 3.5–5.1)
SODIUM: 137 meq/L (ref 135–145)
Total Bilirubin: 0.7 mg/dL (ref 0.2–1.2)
Total Protein: 6.3 g/dL (ref 6.0–8.3)

## 2017-02-17 LAB — HEMOGLOBIN A1C: HEMOGLOBIN A1C: 6.8 % — AB (ref 4.6–6.5)

## 2017-02-17 LAB — MICROALBUMIN / CREATININE URINE RATIO
Creatinine,U: 154.3 mg/dL
MICROALB UR: 2.4 mg/dL — AB (ref 0.0–1.9)
MICROALB/CREAT RATIO: 1.5 mg/g (ref 0.0–30.0)

## 2017-02-19 NOTE — Progress Notes (Signed)
[place]  [phone] [phone]  ID: Matthew Miles. OB: October 23, 1928  MR#: [id number]  FWY#:[id number]  Patient Care Team: [name], MD as PCP - General (Family Medicine) Minna Merritts, MD as Consulting Physician (Cardiology)  CHIEF COMPLAINT: Thrombocytopenia.  INTERVAL HISTORY: Patient returns to clinic today for repeat laboratory work and further evaluation. He continues to feel well and is asymptomatic.  He has no neurologic complaints. He denies any easy bleeding or bruising.  He has a good appetite and denies weight loss.  He denies any fevers or illnesses.  He has no chest pain or shortness of breath.  He denies any nausea, vomiting, constipation, or diarrhea.  He has no urinary complaints.  Patient offers no specific complaints today.  REVIEW OF SYSTEMS:   Review of Systems  Constitutional: Negative for fever, malaise/fatigue and weight loss.  Respiratory: Negative.  Negative for cough, hemoptysis and shortness of breath.   Cardiovascular: Negative.  Negative for chest pain and leg swelling.  Gastrointestinal: Negative.  Negative for blood in stool and melena.  Genitourinary: Negative.   Musculoskeletal: Negative.   Skin: Negative.  Negative for rash.  Neurological: Negative.  Negative for weakness.  Endo/Heme/Allergies: Does not bruise/bleed easily.  Psychiatric/Behavioral: Negative.  The patient is not nervous/anxious.     As per HPI. Otherwise, a complete review of systems is negative.  PAST MEDICAL HISTORY: Past Medical History:  Diagnosis Date  . Allergy   . Arthritis   . CHF (congestive heart failure) ([place])   . Chicken pox   . Cholecystitis   . Colon polyps   . COPD (chronic obstructive pulmonary disease) ([place])   . Coronary artery disease   . Emphysema of lung ([place])   . GERD (gastroesophageal reflux disease)   . Heart murmur   . Hematemesis/vomiting blood 04/10/11  . Hypercholesterolemia   . Mild  hypertension   . Pancreatitis   . Prostate cancer ([place])   . Prostate cancer [facility])    radiation therapy   . Smoker   . Ulcer   . Upper GI bleed 1980    PAST SURGICAL HISTORY: Past Surgical History:  Procedure Laterality Date  . APPENDECTOMY    . CARDIAC CATHETERIZATION  May 2002 and Feb 2013   [facility]; no stents   . CATARACT EXTRACTION    . CHOLECYSTECTOMY    . CIRCUMCISION    . COLONOSCOPY    . HEMORRHOID SURGERY    . SP CHOLECYSTOMY    . STOMACH SURGERY     bleeding ulcers, followed by Dr. [name]    FAMILY HISTORY Family History  Problem Relation Age of Onset  . Prostate cancer Father   . Liver cancer Brother   . Prostate cancer Brother   . Emphysema Sister        smoker       ADVANCED DIRECTIVES:    HEALTH MAINTENANCE: Social History   Tobacco Use  . Smoking status: Former Smoker    Packs/day: 1.00    Years: 65.00    Pack years: 65.00    Types: Cigarettes    Last attempt to quit: 04/10/2011    Years since quitting: 5.8  . Smokeless tobacco: Former Systems developer    Types: Chew  Substance Use Topics  . Alcohol use: Yes    Comment: 1 beer rarely  . Drug use: No     Colonoscopy:  PAP:  Bone density:  Lipid panel:  Allergies  Allergen Reactions  . Sulfa Antibiotics  GI upset  . Tradjenta [Linagliptin] Other (See Comments)    Hair loss    Current Outpatient Medications  Medication Sig Dispense Refill  . alfuzosin (UROXATRAL) 10 MG 24 hr tablet Take 1 tablet (10 mg total) by mouth daily with breakfast. 30 tablet 11  . aspirin 81 MG tablet Take 81 mg by mouth daily.     . carvedilol (COREG) 6.25 MG tablet Take 1 tablet (6.25 mg total) by mouth 2 (two) times daily. 180 tablet 3  . Cyanocobalamin (B-12) 1000 MCG TBCR Take 1 tablet by mouth daily.     Marland Kitchen glipiZIDE (GLUCOTROL XL) 10 MG 24 hr tablet Take 1 tablet (10 mg total) by mouth 2 (two) times daily. 180 tablet 3  . glucose blood test strip One touch  berio  test strips. Use to check blood sugar two  times a day. Dx. E11.9 (Patient taking differently: One touch  verio  test strips. Use to check blood sugar two times a day. Dx. E11.9) 200 each 11  . metFORMIN (GLUCOPHAGE-XR) 500 MG 24 hr tablet TAKE 2 TABLETS(1000 MG) BY MOUTH TWICE DAILY 360 tablet 3  . nitroGLYCERIN (NITROSTAT) 0.4 MG SL tablet Place 1 tablet (0.4 mg total) under the tongue every 5 (five) minutes as needed. 25 tablet 0  . pantoprazole (PROTONIX) 20 MG tablet Take 1 tablet (20 mg total) by mouth daily. 30 tablet 2  . potassium chloride SA (K-DUR,KLOR-CON) 20 MEQ tablet Take 1 tablet (20 mEq total) by mouth daily. 90 tablet 3  . pravastatin (PRAVACHOL) 40 MG tablet Take 1 tablet (40 mg total) by mouth daily. 90 tablet 3  . PROAIR HFA 108 (90 Base) MCG/ACT inhaler INHALE 2 PUFFS INTO THE LUNGS EVERY 6 HOURS AS NEEDED FOR WHEEZING OR SHORTNESS OF BREATH 8.5 g 0  . sitaGLIPtin (JANUVIA) 50 MG tablet Take 1 tablet (50 mg total) by mouth daily. 90 tablet 3  . SPIRIVA RESPIMAT 2.5 MCG/ACT AERS INHALE 2 PUFFS INTO THE LUNGS DAILY 4 g 0  . torsemide (DEMADEX) 10 MG tablet Take 1 tablet (10 mg total) by mouth 2 (two) times daily as needed. (Patient taking differently: Take 10-20 mg by mouth 2 (two) times daily. 20 mg every morning and 10 mg every evening (skips on Sundays mornings).) 180 tablet 3  . traMADol (ULTRAM) 50 MG tablet Take 1 tablet (50 mg total) by mouth every 8 (eight) hours as needed. 30 tablet 0   No current facility-administered medications for this visit.     OBJECTIVE: Vitals:   02/21/17 1203  BP: 139/62  Pulse: 80  Resp: 18  Temp: (!) 97 F (36.1 C)     Body mass index is 35.17 kg/m.    ECOG FS:0 - Asymptomatic  General: Well-developed, well-nourished, no acute distress. Eyes: Pink conjunctiva, anicteric sclera. Lungs: Clear to auscultation bilaterally. Heart: Regular rate and rhythm. No rubs, murmurs, or gallops. Abdomen: Soft, nontender, nondistended. No organomegaly noted, normoactive bowel  sounds. Musculoskeletal: No edema, cyanosis, or clubbing. Neuro: Alert, answering all questions appropriately. Cranial nerves grossly intact. Skin: No rashes or petechiae noted. Psych: Normal affect.   LAB RESULTS:  Lab Results  Component Value Date   NA 137 02/17/2017   K 4.6 02/17/2017   CL 102 02/17/2017   CO2 27 02/17/2017   GLUCOSE 129 (H) 02/17/2017   BUN 22 02/17/2017   CREATININE 1.19 02/17/2017   CALCIUM 9.5 02/17/2017   PROT 6.3 02/17/2017   ALBUMIN 4.1 02/17/2017   AST 10 02/17/2017  ALT 11 02/17/2017   ALKPHOS 59 02/17/2017   BILITOT 0.7 02/17/2017   GFRNONAA 43 (L) 07/30/2016   GFRAA 50 (L) 07/30/2016    Lab Results  Component Value Date   WBC 5.6 02/21/2017   NEUTROABS 3.7 02/21/2017   HGB 11.2 (L) 02/21/2017   HCT 33.8 (L) 02/21/2017   MCV 87.2 02/21/2017   PLT 115 (L) 02/21/2017     STUDIES: No results found.  ASSESSMENT:  Thrombocytopenia.  PLAN:    1.  Thrombocytopenia: Patient's most recent platelet count of 115 continues to be decreased, but essentially unchanged from previous. Patient noted to have positive platelet antibody on repeat testing in January 2017. These can be transient nature and will consider repeating test in the future, but this is not necessary at this time. The remainder of his laboratory work was either negative or within normal limits. No intervention is needed at this time.  Patient does not require bone marrow biopsy. Return to clinic in 6 months with repeat laboratory work and further evaluation.  If patient's platelet count remains stable at that time, can consider discharging from clinic with close follow-up with primary care. 2. Anemia: Mild, monitor. 3. Hypertension: Patient's blood pressure is within normal limits today. Continue evaluation and treatment per primary care.  Patient expressed understanding and was in agreement with this plan. He also understands that He can call clinic at any time with any questions,  concerns, or complaints.   Lloyd Huger, MD   02/21/2017 4:18 PM

## 2017-02-21 ENCOUNTER — Inpatient Hospital Stay: Payer: PPO | Attending: Oncology

## 2017-02-21 ENCOUNTER — Inpatient Hospital Stay: Payer: PPO | Admitting: Oncology

## 2017-02-21 VITALS — BP 139/62 | HR 80 | Temp 97.0°F | Resp 18 | Wt 192.3 lb

## 2017-02-21 DIAGNOSIS — Z79899 Other long term (current) drug therapy: Secondary | ICD-10-CM | POA: Insufficient documentation

## 2017-02-21 DIAGNOSIS — I110 Hypertensive heart disease with heart failure: Secondary | ICD-10-CM | POA: Insufficient documentation

## 2017-02-21 DIAGNOSIS — Z8546 Personal history of malignant neoplasm of prostate: Secondary | ICD-10-CM | POA: Insufficient documentation

## 2017-02-21 DIAGNOSIS — D649 Anemia, unspecified: Secondary | ICD-10-CM | POA: Insufficient documentation

## 2017-02-21 DIAGNOSIS — Z87891 Personal history of nicotine dependence: Secondary | ICD-10-CM | POA: Insufficient documentation

## 2017-02-21 DIAGNOSIS — Z86010 Personal history of colonic polyps: Secondary | ICD-10-CM | POA: Insufficient documentation

## 2017-02-21 DIAGNOSIS — J449 Chronic obstructive pulmonary disease, unspecified: Secondary | ICD-10-CM | POA: Insufficient documentation

## 2017-02-21 DIAGNOSIS — K219 Gastro-esophageal reflux disease without esophagitis: Secondary | ICD-10-CM | POA: Insufficient documentation

## 2017-02-21 DIAGNOSIS — I2510 Atherosclerotic heart disease of native coronary artery without angina pectoris: Secondary | ICD-10-CM | POA: Diagnosis not present

## 2017-02-21 DIAGNOSIS — Z7982 Long term (current) use of aspirin: Secondary | ICD-10-CM | POA: Insufficient documentation

## 2017-02-21 DIAGNOSIS — K859 Acute pancreatitis without necrosis or infection, unspecified: Secondary | ICD-10-CM | POA: Insufficient documentation

## 2017-02-21 DIAGNOSIS — D696 Thrombocytopenia, unspecified: Secondary | ICD-10-CM | POA: Insufficient documentation

## 2017-02-21 DIAGNOSIS — Z7984 Long term (current) use of oral hypoglycemic drugs: Secondary | ICD-10-CM | POA: Diagnosis not present

## 2017-02-21 DIAGNOSIS — E7800 Pure hypercholesterolemia, unspecified: Secondary | ICD-10-CM | POA: Diagnosis not present

## 2017-02-21 LAB — CBC WITH DIFFERENTIAL/PLATELET
BASOS ABS: 0.1 10*3/uL (ref 0–0.1)
BASOS PCT: 1 %
EOS PCT: 1 %
Eosinophils Absolute: 0.1 10*3/uL (ref 0–0.7)
HEMATOCRIT: 33.8 % — AB (ref 40.0–52.0)
Hemoglobin: 11.2 g/dL — ABNORMAL LOW (ref 13.0–18.0)
Lymphocytes Relative: 23 %
Lymphs Abs: 1.3 10*3/uL (ref 1.0–3.6)
MCH: 29 pg (ref 26.0–34.0)
MCHC: 33.2 g/dL (ref 32.0–36.0)
MCV: 87.2 fL (ref 80.0–100.0)
MONO ABS: 0.5 10*3/uL (ref 0.2–1.0)
MONOS PCT: 10 %
Neutro Abs: 3.7 10*3/uL (ref 1.4–6.5)
Neutrophils Relative %: 65 %
PLATELETS: 115 10*3/uL — AB (ref 150–440)
RBC: 3.88 MIL/uL — ABNORMAL LOW (ref 4.40–5.90)
RDW: 15.2 % — AB (ref 11.5–14.5)
WBC: 5.6 10*3/uL (ref 3.8–10.6)

## 2017-02-24 ENCOUNTER — Other Ambulatory Visit: Payer: Self-pay | Admitting: Family Medicine

## 2017-02-28 ENCOUNTER — Other Ambulatory Visit: Payer: Self-pay | Admitting: Family Medicine

## 2017-02-28 DIAGNOSIS — J449 Chronic obstructive pulmonary disease, unspecified: Secondary | ICD-10-CM

## 2017-03-17 ENCOUNTER — Other Ambulatory Visit: Payer: Self-pay | Admitting: Family Medicine

## 2017-03-24 ENCOUNTER — Other Ambulatory Visit: Payer: Self-pay | Admitting: Family Medicine

## 2017-04-12 DIAGNOSIS — E119 Type 2 diabetes mellitus without complications: Secondary | ICD-10-CM | POA: Diagnosis not present

## 2017-04-12 LAB — HM DIABETES EYE EXAM

## 2017-04-13 ENCOUNTER — Encounter: Payer: Self-pay | Admitting: Family Medicine

## 2017-04-20 ENCOUNTER — Other Ambulatory Visit: Payer: Self-pay | Admitting: Family Medicine

## 2017-04-23 ENCOUNTER — Other Ambulatory Visit: Payer: Self-pay | Admitting: Family Medicine

## 2017-05-06 ENCOUNTER — Other Ambulatory Visit: Payer: Self-pay | Admitting: Family Medicine

## 2017-05-08 ENCOUNTER — Other Ambulatory Visit: Payer: Self-pay | Admitting: Family Medicine

## 2017-05-08 DIAGNOSIS — J449 Chronic obstructive pulmonary disease, unspecified: Secondary | ICD-10-CM

## 2017-05-15 ENCOUNTER — Ambulatory Visit (INDEPENDENT_AMBULATORY_CARE_PROVIDER_SITE_OTHER): Payer: PPO | Admitting: Family Medicine

## 2017-05-15 ENCOUNTER — Other Ambulatory Visit: Payer: Self-pay

## 2017-05-15 ENCOUNTER — Encounter: Payer: Self-pay | Admitting: Family Medicine

## 2017-05-15 DIAGNOSIS — E1165 Type 2 diabetes mellitus with hyperglycemia: Secondary | ICD-10-CM

## 2017-05-15 DIAGNOSIS — K219 Gastro-esophageal reflux disease without esophagitis: Secondary | ICD-10-CM | POA: Diagnosis not present

## 2017-05-15 DIAGNOSIS — J069 Acute upper respiratory infection, unspecified: Secondary | ICD-10-CM

## 2017-05-15 DIAGNOSIS — I5032 Chronic diastolic (congestive) heart failure: Secondary | ICD-10-CM | POA: Diagnosis not present

## 2017-05-15 DIAGNOSIS — J432 Centrilobular emphysema: Secondary | ICD-10-CM

## 2017-05-15 MED ORDER — SITAGLIPTIN PHOSPHATE 50 MG PO TABS: ORAL_TABLET | ORAL | 2 refills | Status: DC

## 2017-05-15 MED ORDER — TORSEMIDE 10 MG PO TABS: 10.0000 mg | ORAL_TABLET | Freq: Two times a day (BID) | ORAL | 3 refills | Status: DC | PRN

## 2017-05-15 MED ORDER — METFORMIN HCL ER 500 MG PO TB24: ORAL_TABLET | ORAL | 3 refills | Status: DC

## 2017-05-15 NOTE — Assessment & Plan Note (Signed)
Suspect previous viral illness related.  Less likely related to the smoke he possibly inhaled.  He is currently asymptomatic and has been so for the last week.  Discussed monitoring for return of symptoms and if they return he will let us know.

## 2017-05-15 NOTE — Assessment & Plan Note (Signed)
Euvolemic.  Continue to monitor.  Torsemide will be refilled.

## 2017-05-15 NOTE — Progress Notes (Signed)
Matthew Rumps, MD Phone: [phone]  Blade Scheff. is a 84 y.o. male who presents today for follow-up.  Diabetes: Typically low 100s though excursions to 170.  Taking Januvia.  He is run out of metformin.  Taking glipizide.  No polyuria or polydipsia.  He is eating moderately healthily.  Watching his portion sizes.  COPD: Using Spiriva and albuterol.  Occasional wheezing.  Rare cough productive of clear mucus.  No shortness of breath.  This is all stable.  Heart failure: Diastolic in nature.  Notes his weight is been stable at home.  Breathing is fine.  No orthopnea or PND.  Taking torsemide 1 tablet twice daily.  GERD: On Protonix once daily.  No reflux symptoms, blood in stool, or abdominal pain.  He notes a couple weeks ago he developed some sore throat and some postnasal drip with blowing some clear mucus out of his nose.  Notes it hurts to swallow when this occurred.  Notes this is around the time where he went out to eat with his family though was also burning some trash and did inhale a small amount of smoke.  He feels normal now.  Took about a week to resolve.  Asymptomatic currently.  Social History   Tobacco Use  Smoking Status Former Smoker  . Packs/day: 1.00  . Years: 65.00  . Pack years: 65.00  . Types: Cigarettes  . Last attempt to quit: 04/10/2011  . 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 Types: Chew     ROS see history of present illness  Objective  Physical Exam Vitals:   05/15/17 1521  BP: 132/66  Pulse: 99  Temp: 98.4 F (36.9 C)  SpO2: 94%    BP Readings from Last 3 Encounters:  05/15/17 132/66  02/21/17 139/62  02/10/17 (!) 160/60   Wt Readings from Last 3 Encounters:  05/15/17 197 lb 3.2 oz (89.4 kg)  02/21/17 192 lb 4.8 oz (87.2 kg)  02/10/17 191 lb 3.2 oz (86.7 kg)    Physical Exam  Constitutional: No distress.  HENT:  Mouth/Throat: Oropharynx is clear and moist. No oropharyngeal exudate.  Eyes:  Conjunctivae are normal.  Cardiovascular: Normal rate, regular rhythm and normal heart sounds.  Pulmonary/Chest: Effort normal and breath sounds normal.  Musculoskeletal: He exhibits no edema.  Neurological: He is alert. Gait normal.  Skin: Skin is warm and dry. He is not diaphoretic.     Assessment/Plan: Please see individual problem list.  Chronic diastolic CHF (congestive heart failure) ([place]) Euvolemic.  Continue to monitor.  Torsemide will be refilled.  COPD (chronic obstructive pulmonary disease) ([place]) Well-controlled.  Continue current regimen.  GERD (gastroesophageal reflux disease) Currently well controlled.  Continue Protonix.  Diabetes mellitus type 2, uncontrolled ([place]) Patient is not quite yet due for an A1c.  He will return in 1 week for this.  URI (upper respiratory infection) Suspect previous viral illness related.  Less likely related to the smoke he possibly inhaled.  He is currently asymptomatic and has been so for the last week.  Discussed monitoring for return of symptoms and if they return he will let us know.   Orders Placed This Encounter  Procedures  . POCT HgB A1C    Standing Status:   Future    Standing Expiration Date:   07/13/2017    Meds ordered this encounter  Medications  . sitaGLIPtin (JANUVIA) 50 MG tablet    Sig: TAKE 1 TABLET(50 MG) BY MOUTH DAILY  Dispense:  90 tablet    Refill:  2  . torsemide (DEMADEX) 10 MG tablet    Sig: Take 1 tablet (10 mg total) by mouth 2 (two) times daily as needed.    Dispense:  180 tablet    Refill:  3  . metFORMIN (GLUCOPHAGE-XR) 500 MG 24 hr tablet    Sig: TAKE 2 TABLETS(1000 MG) BY MOUTH TWICE DAILY    Dispense:  360 tablet    Refill:  3     Matthew Rumps, MD [place]

## 2017-05-15 NOTE — Assessment & Plan Note (Signed)
Well-controlled.  Continue current regimen. 

## 2017-05-15 NOTE — Assessment & Plan Note (Signed)
Currently well controlled.  Continue Protonix.

## 2017-05-15 NOTE — Assessment & Plan Note (Signed)
Patient is not quite yet due for an A1c.  He will return in 1 week for this.

## 2017-05-15 NOTE — Patient Instructions (Signed)
Nice to see you. We will have you return in 1 week to check your A1c. We I have refilled your medications. If your respiratory symptoms return please let us know.

## 2017-05-21 ENCOUNTER — Other Ambulatory Visit: Payer: Self-pay | Admitting: Family Medicine

## 2017-05-23 ENCOUNTER — Ambulatory Visit (INDEPENDENT_AMBULATORY_CARE_PROVIDER_SITE_OTHER): Payer: PPO | Admitting: *Deleted

## 2017-05-23 DIAGNOSIS — E1165 Type 2 diabetes mellitus with hyperglycemia: Secondary | ICD-10-CM | POA: Diagnosis not present

## 2017-05-23 LAB — POCT GLYCOSYLATED HEMOGLOBIN (HGB A1C): Hemoglobin A1C: 7

## 2017-05-23 NOTE — Progress Notes (Signed)
Patient in for A1c point of care

## 2017-06-05 ENCOUNTER — Other Ambulatory Visit: Payer: Self-pay | Admitting: Family Medicine

## 2017-06-05 DIAGNOSIS — J449 Chronic obstructive pulmonary disease, unspecified: Secondary | ICD-10-CM

## 2017-06-06 ENCOUNTER — Other Ambulatory Visit: Payer: Self-pay | Admitting: Family Medicine

## 2017-06-08 ENCOUNTER — Encounter: Payer: Self-pay | Admitting: Urology

## 2017-06-08 ENCOUNTER — Ambulatory Visit: Payer: PPO | Admitting: Urology

## 2017-06-08 VITALS — BP 99/61 | HR 107 | Ht 62.0 in | Wt 192.0 lb

## 2017-06-08 DIAGNOSIS — C61 Malignant neoplasm of prostate: Secondary | ICD-10-CM

## 2017-06-08 DIAGNOSIS — R972 Elevated prostate specific antigen [PSA]: Secondary | ICD-10-CM | POA: Diagnosis not present

## 2017-06-08 NOTE — Progress Notes (Signed)
06/08/2017 3:04 PM   Matthew Miles. 07-Feb-1929 [id number]  Referring provider: [name], MD [address] [place], [place] [address]  Chief Complaint  Patient presents with  . Prostate Cancer    52month    HPI: 1) PCa - Pt with a history of prostate cancer and rising PSA after XRT. He had radiation done in "early 1990s or 2000's". He saw Dr. [name]. His PSA was 0.93 Apr 2014 and had risen to 2.14 Jan 2016 -- PSADT = 2.4 years. An Apr 2014 CT A/P was done which showed no mets, gold seeds in prostate. PSA slightly rose to 2.7 in August 2018. PSA for today is pending.  2) frequency - seen earlier this month with frequency and weak stream. Also, hesitancy. UA clear. He voids with a good stream. Some frequency after diuretics. No gross hematuria.    Patient was started on alfuzosin at his last appointment.  This medication works well for him.  He is very happy with his urine at this time since starting this medication.    PMH: Past Medical History:  Diagnosis Date  . Allergy   . Arthritis   . CHF (congestive heart failure) ([place])   . Chicken pox   . Cholecystitis   . Colon polyps   . COPD (chronic obstructive pulmonary disease) ([place])   . Coronary artery disease   . Emphysema of lung ([place])   . GERD (gastroesophageal reflux disease)   . Heart murmur   . Hematemesis/vomiting blood 04/10/11  . Hypercholesterolemia   . Mild hypertension   . Pancreatitis   . Prostate cancer ([place])   . Prostate cancer [facility])    radiation therapy   . Smoker   . Ulcer   . Upper GI bleed 1980    Surgical History: Past Surgical History:  Procedure Laterality Date  . APPENDECTOMY    . CARDIAC CATHETERIZATION  May 2002 and Feb 2013   [facility]; no stents   . CATARACT EXTRACTION    . CHOLECYSTECTOMY    . CIRCUMCISION    . COLONOSCOPY    . HEMORRHOID SURGERY    . SP CHOLECYSTOMY    . STOMACH SURGERY     bleeding ulcers, followed by Dr. [name]    Home Medications:    Allergies as of 06/08/2017      Reactions   Sulfa Antibiotics    GI upset   Tradjenta [linagliptin] Other (See Comments)   Hair loss      Medication List        Accurate as of 06/08/17  3:04 PM. Always use your most recent med list.          alfuzosin 10 MG 24 hr tablet Commonly known as:  UROXATRAL Take 1 tablet (10 mg total) by mouth daily with breakfast.   aspirin 81 MG tablet Take 81 mg by mouth daily.   B-12 1000 MCG Tbcr Take 1 tablet by mouth daily.   carvedilol 6.25 MG tablet Commonly known as:  COREG Take 1 tablet (6.25 mg total) by mouth 2 (two) times daily.   glipiZIDE 10 MG 24 hr tablet Commonly known as:  GLUCOTROL XL Take 1 tablet (10 mg total) by mouth 2 (two) times daily.   metFORMIN 500 MG 24 hr tablet Commonly known as:  GLUCOPHAGE-XR TAKE 2 TABLETS(1000 MG) BY MOUTH TWICE DAILY   nitroGLYCERIN 0.4 MG SL tablet Commonly known as:  NITROSTAT Place 1 tablet (0.4 mg total) under the tongue every 5 (  five) minutes as needed.   ONETOUCH VERIO test strip Generic drug:  glucose blood TEST BLOOD SUGAR TWICE DAILY   pantoprazole 20 MG tablet Commonly known as:  PROTONIX TAKE 1 TABLET(20 MG) BY MOUTH DAILY   potassium chloride SA 20 MEQ tablet Commonly known as:  K-DUR,KLOR-CON TAKE 1 TABLET(20 MEQ) BY MOUTH DAILY   pravastatin 40 MG tablet Commonly known as:  PRAVACHOL Take 1 tablet (40 mg total) by mouth daily.   PROAIR HFA 108 (90 Base) MCG/ACT inhaler Generic drug:  albuterol INHALE 2 PUFFS INTO THE LUNGS EVERY 6 HOURS AS NEEDED FOR WHEEZING OR SHORTNESS OF BREATH   sitaGLIPtin 50 MG tablet Commonly known as:  JANUVIA TAKE 1 TABLET(50 MG) BY MOUTH DAILY   SPIRIVA RESPIMAT 2.5 MCG/ACT Aers Generic drug:  Tiotropium Bromide Monohydrate INHALE 2 PUFFS INTO THE LUNGS DAILY   torsemide 10 MG tablet Commonly known as:  DEMADEX Take 1 tablet (10 mg total) by mouth 2 (two) times daily as needed.   traMADol 50 MG tablet Commonly known as:   ULTRAM Take 1 tablet (50 mg total) by mouth every 8 (eight) hours as needed.       Allergies:  Allergies  Allergen Reactions  . Sulfa Antibiotics     GI upset  . Tradjenta [Linagliptin] Other (See Comments)    Hair loss    Family History: Family History  Problem Relation Age of Onset  . Prostate cancer Father   . Liver cancer Brother   . Prostate cancer Brother   . Emphysema Sister        smoker    Social History:  reports that he quit smoking about 6 years ago. His smoking use included cigarettes. He has a 65.00 pack-year smoking history. He has quit using smokeless tobacco. His smokeless tobacco use included chew. He reports that he drinks alcohol. He reports that he does not use drugs.  ROS: UROLOGY Frequent Urination?: No Hard to postpone urination?: No Burning/pain with urination?: No Get up at night to urinate?: No Leakage of urine?: No Urine stream starts and stops?: No Trouble starting stream?: No Do you have to strain to urinate?: No Blood in urine?: No Urinary tract infection?: No Sexually transmitted disease?: No Injury to kidneys or bladder?: No Painful intercourse?: No Weak stream?: No Erection problems?: No Penile pain?: No  Gastrointestinal Nausea?: No Vomiting?: No Indigestion/heartburn?: No Diarrhea?: No Constipation?: No  Constitutional Fever: No Night sweats?: No Weight loss?: No Fatigue?: No  Skin Skin rash/lesions?: No Itching?: No  Eyes Blurred vision?: No Double vision?: No  Ears/Nose/Throat Sore throat?: No Sinus problems?: No  Hematologic/Lymphatic Swollen glands?: No Easy bruising?: No  Cardiovascular Leg swelling?: No Chest pain?: No  Respiratory Cough?: No Shortness of breath?: No  Endocrine Excessive thirst?: No  Musculoskeletal Back pain?: No Joint pain?: No  Neurological Headaches?: No Dizziness?: No  Psychologic Depression?: No Anxiety?: No  Physical Exam: BP 99/61   Pulse (!) 107   Ht  5\' 2"  (1.575 m)   Wt 192 lb (87.1 kg)   BMI 35.12 kg/m   Constitutional:  Alert and oriented, No acute distress. HEENT: [place] AT, moist mucus membranes.  Trachea midline, no masses. Cardiovascular: No clubbing, cyanosis, or edema. Respiratory: Normal respiratory effort, no increased work of breathing. GI: Abdomen is soft, nontender, nondistended, no abdominal masses GU: No CVA tenderness.  Skin: No rashes, bruises or suspicious lesions. Lymph: No cervical or inguinal adenopathy. Neurologic: Grossly intact, no focal deficits, moving all 4 extremities.  Psychiatric: Normal mood and affect.  Laboratory Data: Lab Results  Component Value Date   WBC 5.6 02/21/2017   HGB 11.2 (L) 02/21/2017   HCT 33.8 (L) 02/21/2017   MCV 87.2 02/21/2017   PLT 115 (L) 02/21/2017    Lab Results  Component Value Date   CREATININE 1.19 02/17/2017    Lab Results  Component Value Date   PSA 2.50 02/04/2016   PSA 0.93 07/11/2012    No results found for: TESTOSTERONE  Lab Results  Component Value Date   HGBA1C 7.0 05/23/2017    Urinalysis    Component Value Date/Time   COLORURINE Yellow 08/09/2012 1900   APPEARANCEUR Clear 11/17/2016 1415   LABSPEC 1.017 08/09/2012 1900   PHURINE 5.0 08/09/2012 1900   GLUCOSEU Negative 11/17/2016 1415   GLUCOSEU Negative 08/09/2012 1900   HGBUR Negative 08/09/2012 1900   BILIRUBINUR Negative 11/17/2016 1415   BILIRUBINUR Negative 08/09/2012 1900   KETONESUR Negative 08/09/2012 1900   PROTEINUR Negative 11/17/2016 1415   PROTEINUR 30 mg/dL 08/09/2012 1900   UROBILINOGEN 0.2 10/08/2013 1153   NITRITE Negative 11/17/2016 1415   NITRITE Negative 08/09/2012 1900   LEUKOCYTESUR Trace (A) 11/17/2016 1415   LEUKOCYTESUR 2+ 08/09/2012 1900     Assessment & Plan:    1. Prostate cancer  I discussed the patient and unfortunately he did not have his PSA drawn prior to his appointment today.,  So it is difficult to give him any advice as to the next step in his  care.  For now we will continue to monitor his PSA.  If it does rise he will need a follow-up appointment in the near future to discuss androgen deprivation therapy.  Currently he will be tentatively scheduled for a follow-up in 6 months the PSA prior.  I did reinforce with the patient the importance of checking his PSA prior to his appointment.  2.  BPH Continue alfuzosin  Return in about 6 months (around 12/06/2017) for PSA prior.  Nickie Retort, MD  [facility] [address] [place], [place] [address] [phone]

## 2017-06-09 LAB — PSA: PROSTATE SPECIFIC AG, SERUM: 3.1 ng/mL (ref 0.0–4.0)

## 2017-06-12 NOTE — Progress Notes (Signed)
[facility] Consultation      Assessment and Plan:  The patient is an 84 year old male with dyspnea on exertion due to COPD.  COPD/chronic bronchitis, with dyspnea on exertion. -Patient has mild dyspnea on exertion, which appears to be compensated. He appears to be doing well with his current regimen of Spiriva Respimat once daily, he continues to be active and independent, mows his own yard on a riding mower and goes to the grocery store. -Continue Spiriva Respimat once daily, also discussed with the patient that I recommend that he continue to try to be physically active as he has been. -Patient is up-to-date with Pneumovax and Prevnar 13 as of 2016.  Date: 06/12/2017  MRN# [id number] Matthew Miles. 05/08/1928    Matthew Miles. is a 84 y.o. old male seen in consultation for chief complaint of:    Chief Complaint  Patient presents with  . COPD    pt reports breathing ok, he has occasional sob with exertion.    HPI:  The patient is an 84 yo male with COPD with mild dyspnea on exertion. At last visit he was asked to continue with spiriva respimat.  He goes to [employer] twice per week and is able to walk around with a cart. He uses a riding mower once to twice per week and does not find that he is limited by breathing. He has had no exacerbations.   He is using respimat 2 puffs once per day, he has not had to use his rescue inhaler.   He appears up to date with pneumovax and prevnar. He does not snore at night.   Medication:    Current Outpatient Medications:  .  alfuzosin (UROXATRAL) 10 MG 24 hr tablet, Take 1 tablet (10 mg total) by mouth daily with breakfast., Disp: 30 tablet, Rfl: 11 .  aspirin 81 MG tablet, Take 81 mg by mouth daily. , Disp: , Rfl:  .  carvedilol (COREG) 6.25 MG tablet, Take 1 tablet (6.25 mg total) by mouth 2 (two) times daily., Disp: 180 tablet, Rfl: 3 .  Cyanocobalamin (B-12) 1000 MCG TBCR, Take 1 tablet by mouth daily. , Disp: ,  Rfl:  .  glipiZIDE (GLUCOTROL XL) 10 MG 24 hr tablet, Take 1 tablet (10 mg total) by mouth 2 (two) times daily., Disp: 180 tablet, Rfl: 3 .  metFORMIN (GLUCOPHAGE-XR) 500 MG 24 hr tablet, TAKE 2 TABLETS(1000 MG) BY MOUTH TWICE DAILY, Disp: 360 tablet, Rfl: 3 .  nitroGLYCERIN (NITROSTAT) 0.4 MG SL tablet, Place 1 tablet (0.4 mg total) under the tongue every 5 (five) minutes as needed., Disp: 25 tablet, Rfl: 0 .  ONETOUCH VERIO test strip, TEST BLOOD SUGAR TWICE DAILY, Disp: 200 each, Rfl: 0 .  pantoprazole (PROTONIX) 20 MG tablet, TAKE 1 TABLET(20 MG) BY MOUTH DAILY, Disp: 30 tablet, Rfl: 3 .  potassium chloride SA (K-DUR,KLOR-CON) 20 MEQ tablet, TAKE 1 TABLET(20 MEQ) BY MOUTH DAILY, Disp: 90 tablet, Rfl: 0 .  pravastatin (PRAVACHOL) 40 MG tablet, Take 1 tablet (40 mg total) by mouth daily., Disp: 90 tablet, Rfl: 3 .  PROAIR HFA 108 (90 Base) MCG/ACT inhaler, INHALE 2 PUFFS INTO THE LUNGS EVERY 6 HOURS AS NEEDED FOR WHEEZING OR SHORTNESS OF BREATH, Disp: 8.5 g, Rfl: 0 .  sitaGLIPtin (JANUVIA) 50 MG tablet, TAKE 1 TABLET(50 MG) BY MOUTH DAILY, Disp: 90 tablet, Rfl: 2 .  SPIRIVA RESPIMAT 2.5 MCG/ACT AERS, INHALE 2 PUFFS INTO THE LUNGS DAILY, Disp: 4 g, Rfl: 0 .  torsemide (DEMADEX) 10 MG tablet, Take 1 tablet (10 mg total) by mouth 2 (two) times daily as needed., Disp: 180 tablet, Rfl: 3 .  traMADol (ULTRAM) 50 MG tablet, Take 1 tablet (50 mg total) by mouth every 8 (eight) hours as needed., Disp: 30 tablet, Rfl: 0   Allergies:  Sulfa antibiotics and Tradjenta [linagliptin]  Review of Systems: Gen:  Denies  fever, sweats, chills HEENT: Denies blurred vision, double vision. bleeds, sore throat Cvc:  No dizziness, chest pain. Resp:   Denies cough or sputum production, shortness of breath Gi: Denies swallowing difficulty, stomach pain. Gu:  Denies bladder incontinence, burning urine Ext:   No Joint pain, stiffness. Skin: No skin rash,  hives  Endoc:  No polyuria, polydipsia. Psych: No  depression, insomnia. Other:  All other systems were reviewed with the patient and were negative other that what is mentioned in the HPI.   Physical Examination:   VS: BP 126/60 (BP Location: Left Arm, Cuff Size: Normal)   Pulse (!) 102   Resp 16   Ht 5\' 2"  (1.575 m)   Wt 193 lb (87.5 kg)   SpO2 94%   BMI 35.30 kg/m   General Appearance: No distress  Neuro:without focal findings,  speech normal,  HEENT: PERRLA, EOM intact.   Pulmonary: normal breath sounds, No wheezing. Decreased air entry bilaterally. CardiovascularNormal S1,S2.  No m/r/g.   Abdomen: Benign, Soft, non-tender. Renal:  No costovertebral tenderness  GU:  No performed at this time. Endoc: No evident thyromegaly, no signs of acromegaly. Skin:   warm, no rashes, no ecchymosis  Extremities: normal, no cyanosis, clubbing.  Other findings:    LABORATORY PANEL:   CBC No results for input(s): WBC, HGB, HCT, PLT in the last 168 hours. ------------------------------------------------------------------------------------------------------------------  Chemistries  No results for input(s): NA, K, CL, CO2, GLUCOSE, BUN, CREATININE, CALCIUM, MG, AST, ALT, ALKPHOS, BILITOT in the last 168 hours.  Invalid input(s): GFRCGP ------------------------------------------------------------------------------------------------------------------  Cardiac Enzymes No results for input(s): TROPONINI in the last 168 hours. ------------------------------------------------------------  RADIOLOGY:  No results found.     Thank  you for the consultation and for allowing [facility] to assist in the care of your patient. Our recommendations are noted above.  Please contact us if we can be of further service.   Marda Stalker, MD.  Board Certified in Internal Medicine, Pulmonary Medicine, [place], and Sleep Medicine.  [facility] Office Number: [phone]  Patricia Pesa, M.D.  [place] Border, M.D  06/12/2017

## 2017-06-14 ENCOUNTER — Ambulatory Visit (INDEPENDENT_AMBULATORY_CARE_PROVIDER_SITE_OTHER): Payer: PPO | Admitting: Internal Medicine

## 2017-06-14 ENCOUNTER — Encounter: Payer: Self-pay | Admitting: Internal Medicine

## 2017-06-14 VITALS — BP 126/60 | HR 102 | Resp 16 | Ht 62.0 in | Wt 193.0 lb

## 2017-06-14 DIAGNOSIS — J449 Chronic obstructive pulmonary disease, unspecified: Secondary | ICD-10-CM

## 2017-06-14 NOTE — Patient Instructions (Signed)
Continue to use your respimat and rescue inhaler as directed.

## 2017-06-16 ENCOUNTER — Other Ambulatory Visit: Payer: Self-pay | Admitting: Family Medicine

## 2017-06-19 ENCOUNTER — Other Ambulatory Visit: Payer: Self-pay | Admitting: Family Medicine

## 2017-06-20 NOTE — Telephone Encounter (Signed)
Last OV 05/15/17 last filled  Glipizide 06/30/16 180 3rf Carvedilol filled by cardiologist

## 2017-06-23 ENCOUNTER — Ambulatory Visit (INDEPENDENT_AMBULATORY_CARE_PROVIDER_SITE_OTHER): Payer: PPO

## 2017-06-23 ENCOUNTER — Telehealth: Payer: Self-pay

## 2017-06-23 VITALS — BP 110/70 | HR 83 | Temp 98.3°F | Resp 16 | Ht 62.0 in | Wt 192.1 lb

## 2017-06-23 DIAGNOSIS — Z0000 Encounter for general adult medical examination without abnormal findings: Secondary | ICD-10-CM | POA: Diagnosis not present

## 2017-06-23 NOTE — Telephone Encounter (Signed)
Matthew Retort, MD  Lestine Box, LPN        Please let patient know his PSA rose again slightly. Will need to see him back in 3 months instead of 6 with PSA prior. He will likley need imaging studies at that time. Thanks    Spoke with pt in reference to rising PSA and needing an OV in 83mo. Pt voiced understanding. Appt made.

## 2017-06-23 NOTE — Patient Instructions (Addendum)
  Matthew Miles , Thank you for taking time to come for your Medicare Wellness Visit. I appreciate your ongoing commitment to your health goals. Please review the following plan we discussed and let me know if I can assist you in the future.   Follow up with Dr. [name] as needed.    Bring a copy of your [place] and/or Living Will to be scanned into chart once completed.   Have a great day!  These are the goals we discussed: Goals    . DIET - INCREASE WATER INTAKE     Stay hydrated.  Drink more water than mountain dew.    . Healthy Lifestyle     Stay hydrated!  Drink plenty of water. Low carb foods.  Choose lean meats, fruits and vegetables.    . Increase physical activity     Ride exercise bike 1-2 times weekly, as tolerated. Use stepping block with a cane, twice a week with repetition.         This is a list of the screening recommended for you and due dates:  Health Maintenance  Topic Date Due  . Complete foot exam   09/13/2017  . Hemoglobin A1C  11/20/2017  . Urine Protein Check  02/17/2018  . Eye exam for diabetics  04/12/2018  . Tetanus Vaccine  04/06/2021  . Flu Shot  Completed  . Pneumonia vaccines  Completed

## 2017-06-23 NOTE — Progress Notes (Signed)
Subjective:   Matthew Miles. is a 84 y.o. male who presents for Medicare Annual/Subsequent preventive examination.  Review of Systems:  No ROS.  Medicare Wellness Visit. Additional risk factors are reflected in the social history.  Cardiac Risk Factors include: advanced age (>90men, >29 women);male gender;hypertension;diabetes mellitus;obesity (BMI >30kg/m2)     Objective:    Vitals: BP 110/70 (BP Location: Left Arm, Patient Position: Sitting, Cuff Size: Normal)   Pulse 83   Temp 98.3 F (36.8 C) (Oral)   Resp 16   Ht 5\' 2"  (1.575 m)   Wt 192 lb 1.9 oz (87.1 kg)   SpO2 98%   BMI 35.14 kg/m   Body mass index is 35.14 kg/m.  Advanced Directives 06/23/2017 02/21/2017 08/23/2016 07/30/2016 07/30/2016 06/22/2016 06/23/2015  Does Patient Have a Medical Advance Directive? Yes No No Yes Yes Yes Yes  Type of Academic librarian - - Brewing technologist Power of [employer] Power of [place];Living will Living will  Does patient want to make changes to medical advance directive? No - Patient declined - - No - Patient declined - No - Patient declined -  Copy of [place] in Chart? No - copy requested - - No - copy requested No - copy requested No - copy requested No - copy requested  Would patient like information on creating a medical advance directive? - - No - Patient declined - - - -    Tobacco Social History   Tobacco Use  Smoking Status Former Smoker  . Packs/day: 1.00  . Years: 65.00  . Pack years: 65.00  . Types: Cigarettes  . Last attempt to quit: 04/10/2011  . Years since quitting: 6.2  Smokeless Tobacco Former Systems developer  . Types: Chew     Counseling given: Not Answered   Clinical Intake:  Pre-visit preparation completed: Yes  Pain : No/denies pain     Nutritional Status: BMI > 30  Obese Diabetes: Yes(Followed by PCP)  How often do you need to have someone help you when you read  instructions, pamphlets, or other written materials from your doctor or pharmacy?: 1 - Never  Interpreter Needed?: No     Past Medical History:  Diagnosis Date  . Allergy   . Arthritis   . CHF (congestive heart failure) ([place])   . Chicken pox   . Cholecystitis   . Colon polyps   . COPD (chronic obstructive pulmonary disease) ([place])   . Coronary artery disease   . Emphysema of lung ([place])   . GERD (gastroesophageal reflux disease)   . Heart murmur   . Hematemesis/vomiting blood 04/10/11  . Hypercholesterolemia   . Mild hypertension   . Pancreatitis   . Prostate cancer ([place])   . Prostate cancer [facility])    radiation therapy   . Smoker   . Ulcer   . Upper GI bleed 1980   Past Surgical History:  Procedure Laterality Date  . APPENDECTOMY    . CARDIAC CATHETERIZATION  May 2002 and Feb 2013   [facility]; no stents   . CATARACT EXTRACTION    . CHOLECYSTECTOMY    . CIRCUMCISION    . COLONOSCOPY    . HEMORRHOID SURGERY    . SP CHOLECYSTOMY    . STOMACH SURGERY     bleeding ulcers, followed by Dr. [name]   Family History  Problem Relation Age of Onset  . Prostate cancer Father   . Liver cancer Brother   .  Prostate cancer Brother   . Emphysema Sister        smoker   Social History   Socioeconomic History  . Marital status: Married    Spouse name: None  . Number of children: 2  . Years of education: None  . Highest education level: None  Social Needs  . Financial resource strain: Not hard at all  . Food insecurity - worry: Never true  . Food insecurity - inability: Never true  . Transportation needs - medical: No  . Transportation needs - non-medical: No  Occupational History  . 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 Smoking status: Former Smoker    Packs/day: 1.00    Years: 65.00    Pack years: 65.00    Types: Cigarettes    Last attempt to quit: 04/10/2011    Years since quitting: 6.2  . Smokeless tobacco: Former Systems developer      Types: Chew  Substance and Sexual Activity  . Alcohol use: Yes    Comment: 1 beer rarely  . Drug use: No  . Sexual activity: Not Currently  Other Topics Concern  . None  Social History Narrative   Lives in [place] alone. Wife in nursing home. Has 2 children.      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 aspirin 81 MG tablet Take 81 mg by mouth daily.   . carvedilol (COREG) 6.25 MG tablet Take 1 tablet (6.25 mg total) by mouth 2 (two) times daily.  . Cyanocobalamin (B-12) 1000 MCG TBCR Take 1 tablet by mouth daily.   Marland Kitchen glipiZIDE (GLUCOTROL XL) 10 MG 24 hr tablet TAKE 1 TABLET(10 MG) BY MOUTH TWICE DAILY  . metFORMIN (GLUCOPHAGE-XR) 500 MG 24 hr tablet TAKE 2 TABLETS(1000 MG) BY MOUTH TWICE DAILY  . nitroGLYCERIN (NITROSTAT) 0.4 MG SL tablet Place 1 tablet (0.4 mg total) under the tongue every 5 (five) minutes as needed.  Glory Rosebush VERIO test strip TEST BLOOD SUGAR TWICE DAILY  . pantoprazole (PROTONIX) 20 MG tablet TAKE 1 TABLET(20 MG) BY MOUTH DAILY  . potassium chloride SA (K-DUR,KLOR-CON) 20 MEQ tablet TAKE 1 TABLET(20 MEQ) BY MOUTH DAILY  . pravastatin (PRAVACHOL) 40 MG tablet Take 1 tablet (40 mg total) by mouth daily.  Marland Kitchen PROAIR HFA 108 (90 Base) MCG/ACT inhaler INHALE 2 PUFFS INTO THE LUNGS EVERY 6 HOURS AS NEEDED FOR WHEEZING OR SHORTNESS OF BREATH  . sitaGLIPtin (JANUVIA) 50 MG tablet TAKE 1 TABLET(50 MG) BY MOUTH DAILY  . SPIRIVA RESPIMAT 2.5 MCG/ACT AERS INHALE 2 PUFFS INTO THE LUNGS DAILY  . torsemide (DEMADEX) 10 MG tablet Take 1 tablet (10 mg total) by mouth 2 (two) times daily as needed.  . traMADol (ULTRAM) 50 MG tablet Take 1 tablet (50 mg total) by mouth every 8 (eight) hours as needed.  . [DISCONTINUED] alfuzosin (UROXATRAL) 10 MG 24 hr tablet Take 1 tablet (10 mg total) by mouth daily with breakfast.   No facility-administered encounter medications  on file as of 06/23/2017.     Activities of Daily Living In your present state of health, do you have any difficulty performing the following activities: 06/23/2017 07/30/2016  Hearing? Y N  Vision? N N  Difficulty concentrating or making decisions? N N  Walking or climbing stairs? Y Y  Comment Unsteady  gait -  Dressing or bathing? N N  Doing errands, shopping? N N  Preparing Food and eating ? N -  Using the Toilet? N -  In the past six months, have you accidently leaked urine? N -  Do you have problems with loss of bowel control? N -  Managing your Medications? N -  Managing your Finances? N -  Housekeeping or managing your Housekeeping? N -  Some recent data might be hidden    Patient Care Team: [name], MD as PCP - General (Family Medicine) Minna Merritts, MD as Consulting Physician (Cardiology)   Assessment:   This is a routine wellness examination for Nichalas.  The goal of the wellness visit is to assist the patient how to close the gaps in care and create a preventative care plan for the patient.   The roster of all physicians providing medical care to patient is listed in the Snapshot section of the chart.  Osteoporosis risk reviewed.    Safety issues reviewed; Smoke and carbon monoxide detectors in the home. No firearms in the home. Wears seatbelts when driving or riding with others. No violence in the home.  They do not have excessive sun exposure.  Discussed the need for sun protection: hats, long sleeves and the use of sunscreen if there is significant sun exposure.  Patient is alert, normal appearance, oriented to person/place/and time.  Correctly identified the president of the [place] and recalls of 3/3 words. Performs simple calculations and can read correct time from watch face.  Displays appropriate judgement.  No new identified risk were noted.  No failures at ADL's or IADL's.    BMI- discussed the importance of a healthy diet, water intake and the  benefits of aerobic exercise.He tries to eat a healthy diet, drink adequate water and walk for exercise. Educational material provided.   Dental- dentures.  Eye- Visual acuity not assessed per patient preference since they have regular follow up with the ophthalmologist.  Wears corrective lenses.  Sleep patterns- Sleeps 6-7 hours at night.  Wakes feeling rested. Health maintenance gaps- closed.  Patient Concerns: None at this time. Follow up with PCP as needed.  Exercise Activities and Dietary recommendations Current Exercise Habits: The patient does not participate in regular exercise at present  Goals    . DIET - INCREASE LEAN PROTEINS     Low carb diet    . DIET - INCREASE WATER INTAKE     Stay hydrated.  Drink more water than mountain dew.    . Increase physical activity     Ride exercise bike 1-2 times weekly, as tolerated. Use stepping block with a cane, twice a week with repetition.         Fall Risk Fall Risk  06/23/2017 06/22/2016 06/23/2015 04/21/2014 04/01/2013  Falls in the past year? No No No No No  Number falls in past yr: - - - - -  Injury with Fall? - - - - -   Depression Screen PHQ 2/9 Scores 06/23/2017 06/22/2016 06/23/2015 04/21/2014  PHQ - 2 Score 0 0 0 0    Cognitive Function MMSE - Mini Mental State Exam 06/22/2016 06/23/2015  Orientation to time 5 5  Orientation to Place 5 5  Registration 3 3  Attention/ Calculation 5 5  Recall 3 3  Language- name 2 objects 2 2  Language- repeat 1 1  Language- follow 3 step command 3 3  Language- read & follow direction 1 1  Write  a sentence 1 1  Copy design 1 1  Total score 30 30     6CIT Screen 06/23/2017  What Year? 0 points  What month? 0 points  What time? 0 points  Count back from 20 0 points  Months in reverse 0 points  Repeat phrase 0 points  Total Score 0    Immunization History  Administered Date(s) Administered  . Influenza Split 12/29/2011  . Influenza, High Dose Seasonal PF 12/24/2015    . Influenza,inj,Quad PF,6+ Mos 01/10/2013, 01/17/2014, 01/22/2015  . Pneumococcal Conjugate-13 04/21/2014  . Pneumococcal Polysaccharide-23 12/29/2011  . Tdap 04/07/2011    Screening Tests Health Maintenance  Topic Date Due  . FOOT EXAM  09/13/2017  . HEMOGLOBIN A1C  11/20/2017  . URINE MICROALBUMIN  02/17/2018  . OPHTHALMOLOGY EXAM  04/12/2018  . TETANUS/TDAP  04/06/2021  . INFLUENZA VACCINE  Completed  . PNA vac Low Risk Adult  Completed      Plan:    End of life planning; Advance aging; Advanced directives discussed. Copy of HCPOA/living will requested.   I have personally reviewed and noted the following in the patient's chart:   . Medical and social history . Use of alcohol, tobacco or illicit drugs  . Current medications and supplements . Functional ability and status . Nutritional status . Physical activity . Advanced directives . List of other physicians . Hospitalizations, surgeries, and ER visits in previous 12 months . Vitals . Screenings to include cognitive, depression, and falls . Referrals and appointments  In addition, I have reviewed and discussed with patient certain preventive protocols, quality metrics, and best practice recommendations. A written personalized care plan for preventive services as well as general preventive health recommendations were provided to patient.     Varney Biles, LPN  1/61/0960

## 2017-07-04 ENCOUNTER — Other Ambulatory Visit: Payer: Self-pay | Admitting: Family Medicine

## 2017-07-04 DIAGNOSIS — J449 Chronic obstructive pulmonary disease, unspecified: Secondary | ICD-10-CM

## 2017-07-04 NOTE — Telephone Encounter (Signed)
Last OV 05/15/17 last filled 06/07/17 4 g 0rf

## 2017-08-03 ENCOUNTER — Ambulatory Visit (INDEPENDENT_AMBULATORY_CARE_PROVIDER_SITE_OTHER): Payer: PPO | Admitting: Family Medicine

## 2017-08-03 ENCOUNTER — Encounter: Payer: Self-pay | Admitting: Family Medicine

## 2017-08-03 VITALS — BP 132/62 | HR 75 | Temp 98.0°F | Wt 194.4 lb

## 2017-08-03 DIAGNOSIS — R197 Diarrhea, unspecified: Secondary | ICD-10-CM | POA: Diagnosis not present

## 2017-08-03 LAB — BASIC METABOLIC PANEL
BUN: 22 mg/dL (ref 6–23)
CALCIUM: 8.7 mg/dL (ref 8.4–10.5)
CO2: 24 mEq/L (ref 19–32)
CREATININE: 1.29 mg/dL (ref 0.40–1.50)
Chloride: 107 mEq/L (ref 96–112)
GFR: 56.04 mL/min — AB (ref >60.00)
Glucose, Bld: 193 mg/dL — ABNORMAL HIGH (ref 70–99)
Potassium: 4.5 mEq/L (ref 3.5–5.1)
Sodium: 139 mEq/L (ref 135–145)

## 2017-08-03 NOTE — Progress Notes (Signed)
Subjective:    Patient ID: Matthew Miles., male    DOB: Aug 22, 1928, 84 y.o.   MRN: [id number]  HPI  Matthew Miles is an 84 year old male who is here today for loose stools that have been occurring intermittently for one to two months. He presents with his daughter. Patient is HOH Diarrhea: Patient complains of diarrhea.  Onset of diarrhea was one to two months ago Diarrhea is occurring approximately  two times per week but may occur more frequently some weeks. Patient describes diarrhea as loose and sometimes may be a small amount but can be watery at times. Diarrhea has been associated with eating fatting foods and will also eat 3 clementines at one time which can aggravate symptom he believes. Patient denies fever, chills, sweats, N/V, bloody stool, or decreased urination Previous visits for diarrhea: No Evaluation to date: No Treatment at home: No treatments have been tried at home.  Recent trigger:  He reports that food may trigger this and he has noticed that eating butter with his grits can cause symptoms as well as those mentioned above. No recent antibiotic use, recent travel, or suspicious food   Review of Systems  Constitutional: Negative for chills, fatigue and fever.  Respiratory: Negative for cough, shortness of breath and wheezing.   Cardiovascular: Negative for chest pain and palpitations.  Gastrointestinal: Positive for diarrhea. Negative for abdominal pain, blood in stool, nausea and vomiting.  Genitourinary: Negative for dysuria.  Musculoskeletal: Negative for myalgias.  Skin: Negative for rash.  Neurological: Negative for dizziness, weakness and headaches.   Past Medical History:  Diagnosis Date  . Allergy   . Arthritis   . CHF (congestive heart failure) ([place])   . Chicken pox   . Cholecystitis   . Colon polyps   . COPD (chronic obstructive pulmonary disease) ([place])   . Coronary artery disease   . Emphysema of lung ([place])   . GERD (gastroesophageal reflux  disease)   . Heart murmur   . Hematemesis/vomiting blood 04/10/11  . Hypercholesterolemia   . Mild hypertension   . Pancreatitis   . Prostate cancer ([place])   . Prostate cancer [facility])    radiation therapy   . Smoker   . Ulcer   . Upper GI bleed 1980     Social History   Socioeconomic History  . Marital status: Married    Spouse name: Not on file  . Number of children: 2  . Years of education: Not on file  . Highest education level: Not on file  Occupational History  . 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 Financial resource strain: Not hard at all  . Food insecurity:    Worry: Never true    Inability: Never true  . Transportation needs:    Medical: No    Non-medical: No  Tobacco Use  . Smoking status: Former Smoker    Packs/day: 1.00    Years: 65.00    Pack years: 65.00    Types: Cigarettes    Last attempt to quit: 04/10/2011    Years since quitting: 6.3  . Smokeless tobacco: Former Systems developer    Types: Chew  Substance and Sexual Activity  . Alcohol use: Yes    Comment: 1 beer rarely  . Drug use: No  . Sexual activity: Not Currently  Lifestyle  . Physical activity:    Days per week: 0 days    Minutes per session:  0 min  . Stress: Not on file  Relationships  . Social connections:    Talks on phone: Not on file    Gets together: Not on file    Attends religious service: Not on file    Active member of club or organization: Not on file    Attends meetings of clubs or organizations: Not on file    Relationship status: Not on file  . Intimate partner violence:    Fear of current or ex partner: Not on file    Emotionally abused: Not on file    Physically abused: Not on file    Forced sexual activity: Not on file  Other Topics Concern  . Not on file  Social History Narrative   Lives in [place] alone. Wife in nursing home. Has 2 children.      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 APPENDECTOMY    . CARDIAC CATHETERIZATION  May 2002 and Feb 2013   [facility]; no stents   . CATARACT EXTRACTION    . CHOLECYSTECTOMY    . CIRCUMCISION    . COLONOSCOPY    . HEMORRHOID SURGERY    . SP CHOLECYSTOMY    . STOMACH SURGERY     bleeding ulcers, followed by Dr. [name]    Family History  Problem Relation Age of Onset  . Prostate cancer Father   . Liver cancer Brother   . Prostate cancer Brother   . Emphysema Sister        smoker    Allergies  Allergen Reactions  . Sulfa Antibiotics     GI upset  . Tradjenta [Linagliptin] Other (See Comments)    Hair loss    Current Outpatient Medications on File Prior to Visit  Medication Sig Dispense Refill  . aspirin 81 MG tablet Take 81 mg by mouth daily.     . carvedilol (COREG) 6.25 MG tablet Take 1 tablet (6.25 mg total) by mouth 2 (two) times daily. 180 tablet 3  . Cyanocobalamin (B-12) 1000 MCG TBCR Take 1 tablet by mouth daily.     Marland Kitchen glipiZIDE (GLUCOTROL XL) 10 MG 24 hr tablet TAKE 1 TABLET(10 MG) BY MOUTH TWICE DAILY 180 tablet 0  . metFORMIN (GLUCOPHAGE-XR) 500 MG 24 hr tablet TAKE 2 TABLETS(1000 MG) BY MOUTH TWICE DAILY 360 tablet 3  . nitroGLYCERIN (NITROSTAT) 0.4 MG SL tablet Place 1 tablet (0.4 mg total) under the tongue every 5 (five) minutes as needed. 25 tablet 0  . ONETOUCH VERIO test strip TEST BLOOD SUGAR TWICE DAILY 200 each 0  . pantoprazole (PROTONIX) 20 MG tablet TAKE 1 TABLET(20 MG) BY MOUTH DAILY 30 tablet 3  . potassium chloride SA (K-DUR,KLOR-CON) 20 MEQ tablet TAKE 1 TABLET(20 MEQ) BY MOUTH DAILY 90 tablet 0  . pravastatin (PRAVACHOL) 40 MG tablet Take 1 tablet (40 mg total) by mouth daily. 90 tablet 3  . PROAIR HFA 108 (90 Base) MCG/ACT inhaler INHALE 2 PUFFS INTO THE LUNGS EVERY 6 HOURS AS NEEDED FOR WHEEZING OR SHORTNESS OF BREATH 8.5 g 0  . sitaGLIPtin (JANUVIA) 50 MG tablet TAKE 1 TABLET(50 MG) BY MOUTH DAILY 90 tablet 2  .  SPIRIVA RESPIMAT 2.5 MCG/ACT AERS INHALE 2 PUFFS INTO THE LUNGS DAILY 4 g 5  . torsemide (DEMADEX) 10 MG tablet Take 1 tablet (10 mg total) by mouth 2 (two) times  daily as needed. 180 tablet 3  . traMADol (ULTRAM) 50 MG tablet Take 1 tablet (50 mg total) by mouth every 8 (eight) hours as needed. 30 tablet 0   No current facility-administered medications on file prior to visit.     BP 132/62 (BP Location: Left Arm, Patient Position: Sitting, Cuff Size: Normal)   Pulse 75   Temp 98 F (36.7 C) (Oral)   Wt 194 lb 6 oz (88.2 kg)   SpO2 97%   BMI 35.55 kg/m       Objective:   Physical Exam  Constitutional: He is oriented to person, place, and time. He appears well-developed and well-nourished.  HENT:  Mouth/Throat: Oropharynx is clear and moist and mucous membranes are normal.  Eyes: Pupils are equal, round, and reactive to light. No scleral icterus.  Neck: Neck supple.  Cardiovascular: Normal rate and regular rhythm.  Pulmonary/Chest: Effort normal and breath sounds normal. He has no wheezes. He has no rales.  Abdominal: Soft. Bowel sounds are normal. There is no tenderness. There is no CVA tenderness.  Protuberant abdomen   Lymphadenopathy:    He has no cervical adenopathy.  Neurological: He is alert and oriented to person, place, and time.  Skin: Skin is warm and dry. No rash noted.  Psychiatric: He has a normal mood and affect. His behavior is normal. Judgment and thought content normal.       Assessment & Plan:  1. Diarrhea, unspecified type Intermittent symptoms that may be triggered by food. He does take protonix daily however this is not new and GERD is well controlled with this medication and also metformin however this is not new either and symptoms are not occurring daily. Exam and history are most consistent with food trigger or IBS and less likely infection. Will check stool studies and BMP today. Advised patient and daughter to keep a food diary with symptoms to  determine if specific foods are causing symptoms. He will also trial a probiotic for symptoms while awaiting results of stool studies. Advised avoidance of fatty foods and provided written list of food choices. Further advised the importance of remaining hydrated and to follow up for further evaluation if symptoms do not improve with dietary changes and probiotics.   - Basic metabolic panel - Gastrointestinal Pathogen Panel PCR; Future  Matthew Metz, FNP-C

## 2017-08-03 NOTE — Patient Instructions (Signed)
Please go by the lab before you leave.  Also, you can start a probiotic which can be purchased at your pharmacy. Florastor is one option that can be considered.  Also, avoid fatty foods and keep a diary of your daily food and symptoms.  We will contact you with your lab results.  If symptoms do not improve with dietary changes and probiotics, follow up with Dr. Biagio Quint for further evaluation.   Food Choices to Help Relieve Diarrhea, Adult When you have diarrhea, the foods you eat and your eating habits are very important. Choosing the right foods and drinks can help:  Relieve diarrhea.  Replace lost fluids and nutrients.  Prevent dehydration.  What general guidelines should I follow? Relieving diarrhea  Choose foods with less than 2 g or .07 oz. of fiber per serving.  Limit fats to less than 8 tsp (38 g or 1.34 oz.) a day.  Avoid the following: ? Foods and beverages sweetened with high-fructose corn syrup, honey, or sugar alcohols such as xylitol, sorbitol, and mannitol. ? Foods that contain a lot of fat or sugar. ? Fried, greasy, or spicy foods. ? High-fiber grains, breads, and cereals. ? Raw fruits and vegetables.  Eat foods that are rich in probiotics. These foods include dairy products such as yogurt and fermented milk products. They help increase healthy bacteria in the stomach and intestines (gastrointestinal tract, or GI tract).  If you have lactose intolerance, avoid dairy products. These may make your diarrhea worse.  Take medicine to help stop diarrhea (antidiarrheal medicine) only as told by your health care provider. Replacing nutrients  Eat small meals or snacks every 3-4 hours.  Eat bland foods, such as white rice, toast, or baked potato, until your diarrhea starts to get better. Gradually reintroduce nutrient-rich foods as tolerated or as told by your health care provider. This includes: ? Well-cooked protein foods. ? Peeled, seeded, and soft-cooked  fruits and vegetables. ? Low-fat dairy products.  Take vitamin and mineral supplements as told by your health care provider. Preventing dehydration   Start by sipping water or a special solution to prevent dehydration (oral rehydration solution, ORS). Urine that is clear or pale yellow means that you are getting enough fluid.  Try to drink at least 8-10 cups of fluid each day to help replace lost fluids.  You may add other liquids in addition to water, such as clear juice or decaffeinated sports drinks, as tolerated or as told by your health care provider.  Avoid drinks with caffeine, such as coffee, tea, or soft drinks.  Avoid alcohol. What foods are recommended? The items listed may not be a complete list. Talk with your health care provider about what dietary choices are best for you. Grains White rice. White, [place], or pita breads (fresh or toasted), including plain rolls, buns, or bagels. White pasta. Saltine, soda, or graham crackers. Pretzels. Low-fiber cereal. Cooked cereals made with water (such as cornmeal, farina, or cream cereals). Plain muffins. Matzo. Melba toast. Zwieback. Vegetables Potatoes (without the skin). Most well-cooked and canned vegetables without skins or seeds. Tender lettuce. Fruits Apple sauce. Fruits canned in juice. Cooked apricots, cherries, grapefruit, peaches, pears, or plums. Fresh bananas and cantaloupe. Meats and other protein foods Baked or boiled chicken. Eggs. Tofu. Fish. Seafood. Smooth nut butters. Ground or well-cooked tender beef, ham, veal, lamb, pork, or poultry. Dairy Plain yogurt, kefir, and unsweetened liquid yogurt. Lactose-free milk, buttermilk, skim milk, or soy milk. Low-fat or nonfat hard cheese. Beverages Water. Low-calorie  sports drinks. Fruit juices without pulp. Strained tomato and vegetable juices. Decaffeinated teas. Sugar-free beverages not sweetened with sugar alcohols. Oral rehydration solutions, if approved by your health  care provider. Seasoning and other foods Bouillon, broth, or soups made from recommended foods. What foods are not recommended? The items listed may not be a complete list. Talk with your health care provider about what dietary choices are best for you. Grains Whole grain, whole wheat, bran, or rye breads, rolls, pastas, and crackers. Wild or brown rice. Whole grain or bran cereals. Barley. Oats and oatmeal. Corn tortillas or taco shells. Granola. Popcorn. Vegetables Raw vegetables. Fried vegetables. Cabbage, broccoli, Brussels sprouts, artichokes, baked beans, beet greens, corn, kale, legumes, peas, sweet potatoes, and yams. Potato skins. Cooked spinach and cabbage. Fruits Dried fruit, including raisins and dates. Raw fruits. Stewed or dried prunes. Canned fruits with syrup. Meat and other protein foods Fried or fatty meats. Deli meats. Chunky nut butters. Nuts and seeds. Beans and lentils. Berniece Salines. Hot dogs. Sausage. Dairy High-fat cheeses. Whole milk, chocolate milk, and beverages made with milk, such as milk shakes. Half-and-half. Cream. sour cream. Ice cream. Beverages Caffeinated beverages (such as coffee, tea, soda, or energy drinks). Alcoholic beverages. Fruit juices with pulp. Prune juice. Soft drinks sweetened with high-fructose corn syrup or sugar alcohols. High-calorie sports drinks. Fats and oils Butter. Cream sauces. Margarine. Salad oils. Plain salad dressings. Olives. Avocados. Mayonnaise. Sweets and desserts Sweet rolls, doughnuts, and sweet breads. Sugar-free desserts sweetened with sugar alcohols such as xylitol and sorbitol. Seasoning and other foods Honey. Hot sauce. Chili powder. Gravy. Cream-based or milk-based soups. Pancakes and waffles. Summary  When you have diarrhea, the foods you eat and your eating habits are very important.  Make sure you get at least 8-10 cups of fluid each day, or enough to keep your urine clear or pale yellow.  Eat bland foods and  gradually reintroduce healthy, nutrient-rich foods as tolerated, or as told by your health care provider.  Avoid high-fiber, fried, greasy, or spicy foods. This information is not intended to replace advice given to you by your health care provider. Make sure you discuss any questions you have with your health care provider. Document Released: 06/18/2003 Document Revised: 03/25/2016 Document Reviewed: 03/25/2016 Elsevier Interactive Patient Education  [employer].

## 2017-08-04 DIAGNOSIS — R197 Diarrhea, unspecified: Secondary | ICD-10-CM | POA: Diagnosis not present

## 2017-08-04 NOTE — Addendum Note (Signed)
Addended by: Leeanne Rio on: 08/04/2017 12:42 PM   Modules accepted: Orders

## 2017-08-06 NOTE — Progress Notes (Signed)
Cardiology Office Note  Date:  08/08/2017   ID:  Matthew Miles., DOB 1929/03/01, MRN [id number]  PCP:  [name], MD   Chief Complaint  Patient presents with  . other    6 month f/u no complaints today. Meds reviewed verbally with pt.    HPI:  Matthew Miles is a 84 yo-old gentleman with history of  DM II, HBA1C 7.5 coronary artery disease (occluded LAD),  moderate pulmonary hypertension in early 2013,  long history of smoking for 60 years, stopped more than 6 years ago COPD bronchitis requiring several courses of antibiotics at the end of 2012,  December for hematemesis and gastric ulcer seen on EGD,  nonsustained VT per the notes,  acute renal failure  Ejection fraction 55% in 2015 who presents for followup Of his coronary artery disease  GI problems, Diarrhea Sent in a sample Cultures negative  Weight up 4 pounds He feels weight is stable at home  HBA1C 7.0 Total chol 121, LDL 67  Denies any chest pain chronic shortness of breath on exertion No exercise program, Walks at [employer] Has exercise bike but does not use it  Takes torsemide 10 in the AM, 10 in the PM chronic mild leg edema, nonpitting  EKG personally reviewed by myself on todays visit Shows normal sinus rhythm rate 89 bpm left anterior fascicular block, old anterior MI  Other past medical history reviewed He reports that he was recently In the hospital, 07/30/2016 GERD symptoms Ate cabbage that day Better with GI cocktail,Ruled out for [facility] records reviewed with the patient in detail Changed to protonix At discharge    Lab work reviewed with him Total chol 130s, LDL 80  Chronic low back pain Reports that he had a bleeding ulcer 4 years ago at that time stop smoking Last echocardiogram in 2015 showing normal ejection fraction, right heart pressures 37 mmHg  left and right heart cath done in February 2013 showing pulmonary hypertension, stable severe coronary artery  disease  Normal ejection fraction  Right heart catheterization showed significant fluid overload with wedge pressure of 24, moderate pulmonary hypertension. Catheterization showed occluded LAD which was old, no other significant stenoses requiring intervention.   PMH:   has a past medical history of Allergy, Arthritis, CHF (congestive heart failure) ([place]), Chicken pox, Cholecystitis, Colon polyps, COPD (chronic obstructive pulmonary disease) ([place]), Coronary artery disease, Emphysema of lung ([place]), GERD (gastroesophageal reflux disease), Heart murmur, Hematemesis/vomiting blood (04/10/11), Hypercholesterolemia, Mild hypertension, Pancreatitis, Prostate cancer ([place]), Prostate cancer ([place]), Smoker, Ulcer, and Upper GI bleed (1980).  PSH:    Past Surgical History:  Procedure Laterality Date  . APPENDECTOMY    . CARDIAC CATHETERIZATION  May 2002 and Feb 2013   [facility]; no stents   . CATARACT EXTRACTION    . CHOLECYSTECTOMY    . CIRCUMCISION    . COLONOSCOPY    . HEMORRHOID SURGERY    . SP CHOLECYSTOMY    . STOMACH SURGERY     bleeding ulcers, followed by Dr. [name]    Current Outpatient Medications  Medication Sig Dispense Refill  . alfuzosin (UROXATRAL) 10 MG 24 hr tablet Take 10 mg by mouth daily with breakfast.    . aspirin 81 MG tablet Take 81 mg by mouth daily.     . carvedilol (COREG) 6.25 MG tablet Take 1 tablet (6.25 mg total) by mouth 2 (two) times daily. 180 tablet 3  . Cyanocobalamin (B-12) 1000 MCG TBCR Take 1 tablet by mouth  daily.     . glipiZIDE (GLUCOTROL XL) 10 MG 24 hr tablet TAKE 1 TABLET(10 MG) BY MOUTH TWICE DAILY 180 tablet 0  . metFORMIN (GLUCOPHAGE-XR) 500 MG 24 hr tablet TAKE 2 TABLETS(1000 MG) BY MOUTH TWICE DAILY 360 tablet 3  . nitroGLYCERIN (NITROSTAT) 0.4 MG SL tablet Place 1 tablet (0.4 mg total) under the tongue every 5 (five) minutes as needed. 25 tablet 0  . ONETOUCH VERIO test strip TEST BLOOD SUGAR TWICE DAILY 200 each 0  . pantoprazole (PROTONIX) 20  MG tablet TAKE 1 TABLET(20 MG) BY MOUTH DAILY 30 tablet 3  . potassium chloride SA (K-DUR,KLOR-CON) 20 MEQ tablet TAKE 1 TABLET(20 MEQ) BY MOUTH DAILY 90 tablet 0  . pravastatin (PRAVACHOL) 40 MG tablet Take 1 tablet (40 mg total) by mouth daily. 90 tablet 3  . PROAIR HFA 108 (90 Base) MCG/ACT inhaler INHALE 2 PUFFS INTO THE LUNGS EVERY 6 HOURS AS NEEDED FOR WHEEZING OR SHORTNESS OF BREATH 8.5 g 0  . SPIRIVA RESPIMAT 2.5 MCG/ACT AERS INHALE 2 PUFFS INTO THE LUNGS DAILY 4 g 5  . torsemide (DEMADEX) 10 MG tablet Take 1 tablet (10 mg total) by mouth 2 (two) times daily as needed. 180 tablet 3   No current facility-administered medications for this visit.      Allergies:   Sulfa antibiotics and Tradjenta [linagliptin]   Social History:  The patient  reports that he quit smoking about 6 years ago. His smoking use included cigarettes. He has a 65.00 pack-year smoking history. He has quit using smokeless tobacco. His smokeless tobacco use included chew. He reports that he drinks alcohol. He reports that he does not use drugs.   Family History:   family history includes Emphysema in his sister; Liver cancer in his brother; Prostate cancer in his brother and father.    Review of Systems: Review of Systems  Constitutional: Negative.   Respiratory: Negative.   Gastrointestinal: Negative.   Musculoskeletal: Positive for falls and joint pain.  Neurological: Negative.   Psychiatric/Behavioral: Negative.   All other systems reviewed and are negative.   PHYSICAL EXAM: VS:  BP (!) 138/54 (BP Location: Left Arm, Patient Position: Sitting, Cuff Size: Normal)   Pulse 89   Ht 5\' 2"  (1.575 m)   Wt 196 lb (88.9 kg)   BMI 35.85 kg/m  , BMI Body mass index is 35.85 kg/m. Constitutional:  oriented to person, place, and time. No distress.  HENT:  Head: Normocephalic and atraumatic.  Eyes:  no discharge. No scleral icterus.  Neck: Normal range of motion. Neck supple. No JVD present.  Cardiovascular:  Normal rate, regular rhythm, normal heart sounds and intact distal pulses. Exam reveals no gallop and no friction rub. No edema No murmur heard. Pulmonary/Chest: Effort normal and breath sounds normal. No stridor. No respiratory distress.  no wheezes.  no rales.  no tenderness.  Abdominal: Soft.  no distension.  no tenderness.  Musculoskeletal: Normal range of motion.  no  tenderness or deformity.  Neurological:  normal muscle tone. Coordination normal. No atrophy Skin: Skin is warm and dry. No rash noted. not diaphoretic.  Psychiatric:  normal mood and affect. behavior is normal. Thought content normal.     Recent Labs: 02/17/2017: ALT 11 02/21/2017: Hemoglobin 11.2; Platelets 115 08/03/2017: BUN 22; Creatinine, Ser 1.29; Potassium 4.5; Sodium 139    Lipid Panel Lab Results  Component Value Date   CHOL 121 02/17/2017   HDL 27.30 (L) 02/17/2017   LDLCALC 67 02/17/2017  TRIG 136.0 02/17/2017      Wt Readings from Last 3 Encounters:  08/08/17 196 lb (88.9 kg)  08/03/17 194 lb 6 oz (88.2 kg)  06/23/17 192 lb 1.9 oz (87.1 kg)       ASSESSMENT AND PLAN:   Pulmonary hypertension ([place]) - Plan: EKG 12-Lead Last echo 10/2013, ejection fraction greater than 55% Mildly elevated right heart pressures, on torsemide He decreased the dose down to 10 BID For SOB, suggest ed he take extra torsemide  Chronic diastolic CHF (congestive heart failure) ([place]) - Plan: EKG 12-Lead recommended he stay on his torsemide Potassium stable, creatinine 1.29 Take extra torsemide as needed  Essential hypertension - Plan: EKG 12-Lead Blood pressure is well controlled on today's visit. No changes made to the medications. stable  Coronary artery disease involving native coronary artery of native heart without angina pectoris - Plan: EKG 12-Lead Currently with no symptoms of angina. No further workup at this time. Continue current medication regimen.stable  Mixed hyperlipidemia - Plan: EKG  12-Lead Cholesterol is at goal on the current lipid regimen. No changes to the medications were made.  Uncontrolled type 2 diabetes mellitus with other circulatory complication, without long-term current use of insulin (H[facility]) - Hemoglobin A1c 7.0 We have encouraged continued exercise, careful diet management in an effort to lose weight.  Chronic obstructive pulmonary disease, unspecified COPD type ([place]) - Plan: EKG 12-Lead Stable disease, No recent exacerbation Rec. weight loss   GERD Stable symptoms   Total encounter time more than 25 minutes  Greater than 50% was spent in counseling and coordination of care with the patient   Disposition:   F/U  12 months   Orders Placed This Encounter  Procedures  . EKG 12-Lead     Signed, Esmond Plants, M.D., Ph.D. 08/08/2017  [place], [place]

## 2017-08-07 LAB — GASTROINTESTINAL PATHOGEN PANEL PCR
C. DIFFICILE TOX A/B, PCR: NOT DETECTED
CAMPYLOBACTER, PCR: NOT DETECTED
Cryptosporidium, PCR: NOT DETECTED
E COLI (ETEC) LT/ST, PCR: NOT DETECTED
E COLI 0157, PCR: NOT DETECTED
E coli (STEC) stx1/stx2, PCR: NOT DETECTED
GIARDIA LAMBLIA, PCR: NOT DETECTED
Norovirus, PCR: NOT DETECTED
Rotavirus A, PCR: NOT DETECTED
SHIGELLA, PCR: NOT DETECTED
Salmonella, PCR: NOT DETECTED

## 2017-08-08 ENCOUNTER — Ambulatory Visit (INDEPENDENT_AMBULATORY_CARE_PROVIDER_SITE_OTHER): Payer: PPO | Admitting: Cardiovascular Disease

## 2017-08-08 ENCOUNTER — Encounter: Payer: Self-pay | Admitting: Cardiovascular Disease

## 2017-08-08 VITALS — BP 138/54 | HR 89 | Ht 62.0 in | Wt 196.0 lb

## 2017-08-08 DIAGNOSIS — E1059 Type 1 diabetes mellitus with other circulatory complications: Secondary | ICD-10-CM

## 2017-08-08 DIAGNOSIS — I25118 Atherosclerotic heart disease of native coronary artery with other forms of angina pectoris: Secondary | ICD-10-CM

## 2017-08-08 DIAGNOSIS — I272 Pulmonary hypertension, unspecified: Secondary | ICD-10-CM

## 2017-08-08 DIAGNOSIS — E782 Mixed hyperlipidemia: Secondary | ICD-10-CM | POA: Diagnosis not present

## 2017-08-08 DIAGNOSIS — I5032 Chronic diastolic (congestive) heart failure: Secondary | ICD-10-CM

## 2017-08-08 DIAGNOSIS — I10 Essential (primary) hypertension: Secondary | ICD-10-CM

## 2017-08-08 DIAGNOSIS — J432 Centrilobular emphysema: Secondary | ICD-10-CM

## 2017-08-08 NOTE — Patient Instructions (Signed)

## 2017-08-20 NOTE — Progress Notes (Signed)
[facility]  Telephone:([phone] [phone]  ID: Matthew Miles. OB: October 22, 1928  MR#: [id number]  CVE#:[id number]  Patient Care Team: [name], MD as PCP - General (Family Medicine) Rockey Situ Kathlene November, MD as Consulting Physician (Cardiology)  CHIEF COMPLAINT: Thrombocytopenia.  INTERVAL HISTORY: Patient returns to clinic today for routine six-month follow-up and laboratory work.  Continues to feel well and remains asymptomatic.  He has no neurologic complaints. He denies any easy bleeding or bruising.  He has a good appetite and denies weight loss.  He denies any fevers or illnesses.  He has no chest pain or shortness of breath.  He denies any nausea, vomiting, constipation, or diarrhea.  He has no urinary complaints.  Patient feels at his baseline offers no specific complaints today.  REVIEW OF SYSTEMS:   Review of Systems  Constitutional: Negative for fever, malaise/fatigue and weight loss.  Respiratory: Negative.  Negative for cough, hemoptysis and shortness of breath.   Cardiovascular: Negative.  Negative for chest pain and leg swelling.  Gastrointestinal: Negative.  Negative for blood in stool and melena.  Genitourinary: Negative.   Musculoskeletal: Negative.   Skin: Negative.  Negative for rash.  Neurological: Negative.  Negative for weakness.  Endo/Heme/Allergies: Does not bruise/bleed easily.  Psychiatric/Behavioral: Negative.  The patient is not nervous/anxious.     As per HPI. Otherwise, a complete review of systems is negative.  PAST MEDICAL HISTORY: Past Medical History:  Diagnosis Date  . Allergy   . Arthritis   . CHF (congestive heart failure) ([place])   . Chicken pox   . Cholecystitis   . Colon polyps   . COPD (chronic obstructive pulmonary disease) ([place])   . Coronary artery disease   . Emphysema of lung ([place])   . GERD (gastroesophageal reflux disease)   . Heart murmur   . Hematemesis/vomiting blood 04/10/11  .  Hypercholesterolemia   . Mild hypertension   . Pancreatitis   . Prostate cancer ([place])   . Prostate cancer [facility])    radiation therapy   . Smoker   . Ulcer   . Upper GI bleed 1980    PAST SURGICAL HISTORY: Past Surgical History:  Procedure Laterality Date  . APPENDECTOMY    . CARDIAC CATHETERIZATION  May 2002 and Feb 2013   [facility]; no stents   . CATARACT EXTRACTION    . CHOLECYSTECTOMY    . CIRCUMCISION    . COLONOSCOPY    . HEMORRHOID SURGERY    . SP CHOLECYSTOMY    . STOMACH SURGERY     bleeding ulcers, followed by Dr. [name]    FAMILY HISTORY Family History  Problem Relation Age of Onset  . Prostate cancer Father   . Liver cancer Brother   . Prostate cancer Brother   . Emphysema Sister        smoker       ADVANCED DIRECTIVES:    HEALTH MAINTENANCE: Social History   Tobacco Use  . Smoking status: Former Smoker    Packs/day: 1.00    Years: 65.00    Pack years: 65.00    Types: Cigarettes    Last attempt to quit: 04/10/2011    Years since quitting: 6.3  . Smokeless tobacco: Former Systems developer    Types: Chew  Substance Use Topics  . Alcohol use: Yes    Comment: 1 beer rarely  . Drug use: No     Colonoscopy:  PAP:  Bone density:  Lipid panel:  Allergies  Allergen Reactions  .  Sulfa Antibiotics     GI upset  . Tradjenta [Linagliptin] Other (See Comments)    Hair loss    Current Outpatient Medications  Medication Sig Dispense Refill  . aspirin 81 MG tablet Take 81 mg by mouth daily.     . carvedilol (COREG) 6.25 MG tablet Take 1 tablet (6.25 mg total) by mouth 2 (two) times daily. 180 tablet 3  . Cyanocobalamin (B-12) 1000 MCG TBCR Take 1 tablet by mouth daily.     Marland Kitchen glipiZIDE (GLUCOTROL XL) 10 MG 24 hr tablet TAKE 1 TABLET(10 MG) BY MOUTH TWICE DAILY 180 tablet 0  . metFORMIN (GLUCOPHAGE-XR) 500 MG 24 hr tablet TAKE 2 TABLETS(1000 MG) BY MOUTH TWICE DAILY 360 tablet 3  . pantoprazole (PROTONIX) 20 MG tablet TAKE 1 TABLET(20 MG) BY MOUTH DAILY 30 tablet  3  . potassium chloride SA (K-DUR,KLOR-CON) 20 MEQ tablet TAKE 1 TABLET(20 MEQ) BY MOUTH DAILY 90 tablet 0  . pravastatin (PRAVACHOL) 40 MG tablet Take 1 tablet (40 mg total) by mouth daily. 90 tablet 3  . PROAIR HFA 108 (90 Base) MCG/ACT inhaler INHALE 2 PUFFS INTO THE LUNGS EVERY 6 HOURS AS NEEDED FOR WHEEZING OR SHORTNESS OF BREATH 8.5 g 0  . SPIRIVA RESPIMAT 2.5 MCG/ACT AERS INHALE 2 PUFFS INTO THE LUNGS DAILY 4 g 5  . torsemide (DEMADEX) 10 MG tablet Take 1 tablet (10 mg total) by mouth 2 (two) times daily as needed. 180 tablet 3  . alfuzosin (UROXATRAL) 10 MG 24 hr tablet Take 10 mg by mouth daily with breakfast.    . nitroGLYCERIN (NITROSTAT) 0.4 MG SL tablet Place 1 tablet (0.4 mg total) under the tongue every 5 (five) minutes as needed. (Patient not taking: Reported on 08/22/2017) 25 tablet 0  . ONETOUCH VERIO test strip TEST BLOOD SUGAR TWICE DAILY (Patient not taking: Reported on 08/22/2017) 200 each 0   No current facility-administered medications for this visit.     OBJECTIVE: Vitals:   08/22/17 1155  BP: (!) 105/59  Pulse: 84  Resp: 18  Temp: 97.6 F (36.4 C)     Body mass index is 35.61 kg/m.    ECOG FS:0 - Asymptomatic  General: Well-developed, well-nourished, no acute distress. Eyes: Pink conjunctiva, anicteric sclera. Lungs: Clear to auscultation bilaterally. Heart: Regular rate and rhythm. No rubs, murmurs, or gallops. Abdomen: Soft, nontender, nondistended. No organomegaly noted, normoactive bowel sounds. Musculoskeletal: No edema, cyanosis, or clubbing. Neuro: Alert, answering all questions appropriately. Cranial nerves grossly intact. Skin: No rashes or petechiae noted. Psych: Normal affect.   LAB RESULTS:  Lab Results  Component Value Date   NA 139 08/03/2017   K 4.5 08/03/2017   CL 107 08/03/2017   CO2 24 08/03/2017   GLUCOSE 193 (H) 08/03/2017   BUN 22 08/03/2017   CREATININE 1.29 08/03/2017   CALCIUM 8.7 08/03/2017   PROT 6.3 02/17/2017    ALBUMIN 4.1 02/17/2017   AST 10 02/17/2017   ALT 11 02/17/2017   ALKPHOS 59 02/17/2017   BILITOT 0.7 02/17/2017   GFRNONAA 43 (L) 07/30/2016   GFRAA 50 (L) 07/30/2016    Lab Results  Component Value Date   WBC 4.5 08/22/2017   NEUTROABS 3.2 08/22/2017   HGB 10.4 (L) 08/22/2017   HCT 31.4 (L) 08/22/2017   MCV 86.2 08/22/2017   PLT 103 (L) 08/22/2017     STUDIES: No results found.  ASSESSMENT:  Thrombocytopenia.  PLAN:    1.  Thrombocytopenia: Patient's most recent platelet count of 103  continues to be decreased, but essentially unchanged.  He was previously noted to have a positive platelet antibody on repeat testing in January 2017. These can be transient nature and will consider repeating test in the future, but this is not necessary at this time. The remainder of his laboratory work was either negative or within normal limits. No intervention is needed at this time.  Patient does not require bone marrow biopsy. Return to clinic in 6 months with repeat laboratory work and further evaluation.  If patient's platelet count remains essentially stable at that point, he can can be discharged from clinic with close primary care follow-up.   2. Anemia: Hemoglobin remains decreased, but stable.  3. Hypertension: Patient's blood pressure is within normal limits today. Continue evaluation and treatment per primary care.  Approximately 20 minutes was spent in discussion of which greater than 50% was consultation.  Patient expressed understanding and was in agreement with this plan. He also understands that He can call clinic at any time with any questions, concerns, or complaints.   Lloyd Huger, MD   08/22/2017 5:26 PM

## 2017-08-21 ENCOUNTER — Other Ambulatory Visit: Payer: Self-pay | Admitting: *Deleted

## 2017-08-21 DIAGNOSIS — D696 Thrombocytopenia, unspecified: Secondary | ICD-10-CM

## 2017-08-21 NOTE — Progress Notes (Signed)
cb

## 2017-08-22 ENCOUNTER — Other Ambulatory Visit: Payer: Self-pay

## 2017-08-22 ENCOUNTER — Inpatient Hospital Stay: Payer: PPO | Attending: Oncology

## 2017-08-22 ENCOUNTER — Inpatient Hospital Stay (HOSPITAL_BASED_OUTPATIENT_CLINIC_OR_DEPARTMENT_OTHER): Payer: PPO | Admitting: Oncology

## 2017-08-22 VITALS — BP 105/59 | HR 84 | Temp 97.6°F | Resp 18 | Wt 194.7 lb

## 2017-08-22 DIAGNOSIS — I110 Hypertensive heart disease with heart failure: Secondary | ICD-10-CM | POA: Insufficient documentation

## 2017-08-22 DIAGNOSIS — D696 Thrombocytopenia, unspecified: Secondary | ICD-10-CM | POA: Insufficient documentation

## 2017-08-22 DIAGNOSIS — I10 Essential (primary) hypertension: Secondary | ICD-10-CM | POA: Diagnosis not present

## 2017-08-22 DIAGNOSIS — D649 Anemia, unspecified: Secondary | ICD-10-CM

## 2017-08-22 LAB — CBC WITH DIFFERENTIAL/PLATELET
Basophils Absolute: 0.1 10*3/uL (ref 0–0.1)
Basophils Relative: 2 %
Eosinophils Absolute: 0.1 10*3/uL (ref 0–0.7)
Eosinophils Relative: 2 %
HCT: 31.4 % — ABNORMAL LOW (ref 40.0–52.0)
HEMOGLOBIN: 10.4 g/dL — AB (ref 13.0–18.0)
LYMPHS ABS: 0.8 10*3/uL — AB (ref 1.0–3.6)
Lymphocytes Relative: 19 %
MCH: 28.4 pg (ref 26.0–34.0)
MCHC: 33 g/dL (ref 32.0–36.0)
MCV: 86.2 fL (ref 80.0–100.0)
Monocytes Absolute: 0.3 10*3/uL (ref 0.2–1.0)
Monocytes Relative: 6 %
NEUTROS ABS: 3.2 10*3/uL (ref 1.4–6.5)
NEUTROS PCT: 71 %
Platelets: 103 10*3/uL — ABNORMAL LOW (ref 150–440)
RBC: 3.64 MIL/uL — AB (ref 4.40–5.90)
RDW: 15.9 % — ABNORMAL HIGH (ref 11.5–14.5)
WBC: 4.5 10*3/uL (ref 3.8–10.6)

## 2017-08-22 NOTE — Progress Notes (Signed)
Pt here for follow up and stated " im feeling real good "

## 2017-08-31 ENCOUNTER — Ambulatory Visit (INDEPENDENT_AMBULATORY_CARE_PROVIDER_SITE_OTHER): Payer: PPO | Admitting: Family

## 2017-08-31 ENCOUNTER — Encounter: Payer: Self-pay | Admitting: Family

## 2017-08-31 VITALS — BP 140/64 | HR 86 | Temp 97.7°F | Resp 18 | Wt 194.5 lb

## 2017-08-31 DIAGNOSIS — R300 Dysuria: Secondary | ICD-10-CM

## 2017-08-31 DIAGNOSIS — N3001 Acute cystitis with hematuria: Secondary | ICD-10-CM

## 2017-08-31 LAB — POC URINALSYSI DIPSTICK (AUTOMATED)
BILIRUBIN UA: NEGATIVE
Blood, UA: 2
GLUCOSE UA: NEGATIVE
Ketones, UA: NEGATIVE
NITRITE UA: NEGATIVE
Protein, UA: POSITIVE — AB
Spec Grav, UA: 1.015 (ref 1.010–1.025)
Urobilinogen, UA: 0.2 E.U./dL
pH, UA: 5 (ref 5.0–8.0)

## 2017-08-31 MED ORDER — CIPROFLOXACIN HCL 250 MG PO TABS: 250.0000 mg | ORAL_TABLET | Freq: Two times a day (BID) | ORAL | 0 refills | Status: DC

## 2017-08-31 NOTE — Patient Instructions (Signed)
Please begin cipro for urinary tract infection. Do not take your glucotrol while you are taking cipro as it may cause your sugar to drop too low.   If you develop fever >101, weakness or inability to empty your bladder please go directly to the emergency room. Call if symptoms are not improved in 2-3 days.

## 2017-08-31 NOTE — Progress Notes (Signed)
Subjective:    Patient ID: Matthew Blank., male    DOB: [birth year], 84 y.o.   MRN: [id number]  HPI   Matthew Miles is an 84 yr old male who presents today with chief complaint of dysuria.  Reports that 2 nights ago he had burning/frequency, then got to then could not go. Reports that he has a urologist in [place] but forgets the name.  He has hx of of prostate cancer which was treated with radiation.  He denies fever, hematuria or unusual low back pain. Feels like he is successfully emptying his bladder.          Review of Systems See HPI  Past Medical History:  Diagnosis Date  . Allergy   . Arthritis   . CHF (congestive heart failure) ([place])   . Chicken pox   . Cholecystitis   . Colon polyps   . COPD (chronic obstructive pulmonary disease) ([place])   . Coronary artery disease   . Emphysema of lung ([place])   . GERD (gastroesophageal reflux disease)   . Heart murmur   . Hematemesis/vomiting blood 04/10/11  . Hypercholesterolemia   . Mild hypertension   . Pancreatitis   . Prostate cancer ([place])   . Prostate cancer [facility])    radiation therapy   . Smoker   . Ulcer   . Upper GI bleed 1980     Social History   Socioeconomic History  . Marital status: Married    Spouse name: Not on file  . Number of children: 2  . Years of education: Not on file  . Highest education level: Not on file  Occupational History  . 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 Financial resource strain: Not hard at all  . Food insecurity:    Worry: Never true    Inability: Never true  . Transportation needs:    Medical: No    Non-medical: No  Tobacco Use  . Smoking status: Former Smoker    Packs/day: 1.00    Years: 65.00    Pack years: 65.00    Types: Cigarettes    Last attempt to quit: 04/10/2011    Years since quitting: 6.3  . Smokeless tobacco: Former Systems developer    Types: Chew  Substance and Sexual Activity  . Alcohol use: Yes    Comment:  1 beer rarely  . Drug use: No  . Sexual activity: Not Currently  Lifestyle  . Physical activity:    Days per week: 0 days    Minutes per session: 0 min  . Stress: Not on file  Relationships  . Social connections:    Talks on phone: Not on file    Gets together: Not on file    Attends religious service: Not on file    Active member of club or organization: Not on file    Attends meetings of clubs or organizations: Not on file    Relationship status: Not on file  . Intimate partner violence:    Fear of current or ex partner: Not on file    Emotionally abused: Not on file    Physically abused: Not on file    Forced sexual activity: Not on file  Other Topics Concern  . Not on file  Social History Narrative   Lives in [place] alone. Wife in nursing home. Has 2 children.      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 APPENDECTOMY    . CARDIAC CATHETERIZATION  May 2002 and Feb 2013   [facility]; no stents   . CATARACT EXTRACTION    . CHOLECYSTECTOMY    . CIRCUMCISION    . COLONOSCOPY    . HEMORRHOID SURGERY    . SP CHOLECYSTOMY    . STOMACH SURGERY     bleeding ulcers, followed by Dr. [name]    Family History  Problem Relation Age of Onset  . Prostate cancer Father   . Liver cancer Brother   . Prostate cancer Brother   . Emphysema Sister        smoker    Allergies  Allergen Reactions  . Sulfa Antibiotics     GI upset  . Tradjenta [Linagliptin] Other (See Comments)    Hair loss    Current Outpatient Medications on File Prior to Visit  Medication Sig Dispense Refill  . alfuzosin (UROXATRAL) 10 MG 24 hr tablet Take 10 mg by mouth daily with breakfast.    . aspirin 81 MG tablet Take 81 mg by mouth daily.     . carvedilol (COREG) 6.25 MG tablet Take 1 tablet (6.25 mg total) by mouth 2 (two) times daily. 180 tablet 3  . Cyanocobalamin (B-12) 1000 MCG TBCR Take 1 tablet by  mouth daily.     Marland Kitchen glipiZIDE (GLUCOTROL XL) 10 MG 24 hr tablet TAKE 1 TABLET(10 MG) BY MOUTH TWICE DAILY 180 tablet 0  . metFORMIN (GLUCOPHAGE-XR) 500 MG 24 hr tablet TAKE 2 TABLETS(1000 MG) BY MOUTH TWICE DAILY 360 tablet 3  . nitroGLYCERIN (NITROSTAT) 0.4 MG SL tablet Place 1 tablet (0.4 mg total) under the tongue every 5 (five) minutes as needed. 25 tablet 0  . ONETOUCH VERIO test strip TEST BLOOD SUGAR TWICE DAILY 200 each 0  . pantoprazole (PROTONIX) 20 MG tablet TAKE 1 TABLET(20 MG) BY MOUTH DAILY 30 tablet 3  . potassium chloride SA (K-DUR,KLOR-CON) 20 MEQ tablet TAKE 1 TABLET(20 MEQ) BY MOUTH DAILY 90 tablet 0  . pravastatin (PRAVACHOL) 40 MG tablet Take 1 tablet (40 mg total) by mouth daily. 90 tablet 3  . PROAIR HFA 108 (90 Base) MCG/ACT inhaler INHALE 2 PUFFS INTO THE LUNGS EVERY 6 HOURS AS NEEDED FOR WHEEZING OR SHORTNESS OF BREATH 8.5 g 0  . SPIRIVA RESPIMAT 2.5 MCG/ACT AERS INHALE 2 PUFFS INTO THE LUNGS DAILY 4 g 5  . torsemide (DEMADEX) 10 MG tablet Take 1 tablet (10 mg total) by mouth 2 (two) times daily as needed. 180 tablet 3   No current facility-administered medications on file prior to visit.     BP 140/64 (BP Location: Left Arm, Patient Position: Sitting, Cuff Size: Normal)   Pulse 86   Temp 97.7 F (36.5 C) (Oral)   Resp 18   Wt 194 lb 8 oz (88.2 kg)   SpO2 96%   BMI 35.57 kg/m       Objective:   Physical Exam  Constitutional: He is oriented to person, place, and time. He appears well-developed and well-nourished. No distress.  HENT:  Head: Normocephalic and atraumatic.  Cardiovascular: Normal rate and regular rhythm.  No murmur heard. Pulmonary/Chest: Effort normal and breath sounds normal. No respiratory distress. He has no wheezes. He has no rales.  Abdominal: Soft. He exhibits no distension. There is no tenderness.    No palpable urinary retention is noted. Neg CVAT  Musculoskeletal: He exhibits no edema.  Neurological: He is alert and oriented to  person, place, and time.  Skin: Skin is warm and dry.  Psychiatric: He has a normal mood and affect. His behavior is normal. Thought content normal.          Assessment & Plan:   UTI- UA suggestive of UTI. Will begin cipro and send urine for culture.  Pt is advised as follows:     Do not take your glucotrol while you are taking cipro as it may cause your sugar to drop too low.   If you develop fever >101, weakness or inability to empty your bladder please go directly to the emergency room. Call if symptoms are not improved in 2-3 days.

## 2017-08-31 NOTE — Addendum Note (Signed)
Addended by: Johna Sheriff on: 08/31/2017 10:05 AM   Modules accepted: Orders

## 2017-09-01 ENCOUNTER — Other Ambulatory Visit: Payer: Self-pay | Admitting: Family Medicine

## 2017-09-07 ENCOUNTER — Other Ambulatory Visit: Payer: Self-pay | Admitting: Family Medicine

## 2017-09-10 ENCOUNTER — Other Ambulatory Visit: Payer: Self-pay | Admitting: Family Medicine

## 2017-09-12 ENCOUNTER — Ambulatory Visit: Payer: PPO | Admitting: Family Medicine

## 2017-09-14 ENCOUNTER — Encounter: Payer: Self-pay | Admitting: Family Medicine

## 2017-09-14 ENCOUNTER — Ambulatory Visit (INDEPENDENT_AMBULATORY_CARE_PROVIDER_SITE_OTHER): Payer: PPO | Admitting: Family Medicine

## 2017-09-14 VITALS — BP 138/58 | HR 81 | Temp 98.4°F | Ht 62.0 in | Wt 194.8 lb

## 2017-09-14 DIAGNOSIS — N401 Enlarged prostate with lower urinary tract symptoms: Secondary | ICD-10-CM | POA: Diagnosis not present

## 2017-09-14 DIAGNOSIS — I10 Essential (primary) hypertension: Secondary | ICD-10-CM | POA: Diagnosis not present

## 2017-09-14 DIAGNOSIS — D509 Iron deficiency anemia, unspecified: Secondary | ICD-10-CM

## 2017-09-14 DIAGNOSIS — E119 Type 2 diabetes mellitus without complications: Secondary | ICD-10-CM

## 2017-09-14 DIAGNOSIS — N3001 Acute cystitis with hematuria: Secondary | ICD-10-CM | POA: Diagnosis not present

## 2017-09-14 DIAGNOSIS — E1165 Type 2 diabetes mellitus with hyperglycemia: Secondary | ICD-10-CM | POA: Diagnosis not present

## 2017-09-14 LAB — POCT URINALYSIS DIPSTICK
BILIRUBIN UA: NEGATIVE
GLUCOSE UA: NEGATIVE
KETONES UA: NEGATIVE
Nitrite, UA: NEGATIVE
Protein, UA: POSITIVE — AB
Spec Grav, UA: 1.01 (ref 1.010–1.025)
Urobilinogen, UA: 0.2 E.U./dL
pH, UA: 5 (ref 5.0–8.0)

## 2017-09-14 LAB — URINALYSIS, MICROSCOPIC ONLY

## 2017-09-14 LAB — CBC
HEMATOCRIT: 32.2 % — AB (ref 39.0–52.0)
Hemoglobin: 10.8 g/dL — ABNORMAL LOW (ref 13.0–17.0)
MCHC: 33.6 g/dL (ref 30.0–36.0)
MCV: 84.9 fl (ref 78.0–100.0)
PLATELETS: 115 10*3/uL — AB (ref 150.0–400.0)
RBC: 3.79 Mil/uL — AB (ref 4.22–5.81)
RDW: 16.1 % — ABNORMAL HIGH (ref 11.5–15.5)
WBC: 6.4 10*3/uL (ref 4.0–10.5)

## 2017-09-14 LAB — HEMOGLOBIN A1C: Hgb A1c MFr Bld: 8.7 % — ABNORMAL HIGH (ref 4.6–6.5)

## 2017-09-14 NOTE — Assessment & Plan Note (Signed)
Seems to be adequately controlled for age.  We will continue current regimen.  We will check an A1c.  Foot exam completed.

## 2017-09-14 NOTE — Assessment & Plan Note (Signed)
He will continue to see urology.  He sees them this month.

## 2017-09-14 NOTE — Assessment & Plan Note (Signed)
Well-controlled for age.  Continue current regimen.  Edema is likely venous insufficiency.

## 2017-09-14 NOTE — Progress Notes (Signed)
[name], MD Phone: [phone]  Ayodeji Keimig. is a 84 y.o. male who presents today for f/u.  CC: dm, htn, prior uti  DIABETES Disease Monitoring: Blood Sugar ranges-140-150 Polyuria/phagia/dipsia- no      Optho- UTD Medications: Compliance- taking metformin, glipizide Hypoglycemic symptoms- no  HYPERTENSION  Disease Monitoring  Home BP Monitoring similar to today Chest pain- no    Dyspnea- no Medications  Compliance-  Taking coreg, torsemide.  Edema- chronic and stable likely venous insufficiency  Recently seen for UTI.  Culture was not sent.  He noted no gross hematuria though there was blood on the dipstick.  Also had protein.  He notes no symptoms now.  Treated with ciprofloxacin.  He continues to follow with urology and is on Uroxatrol.  Notes some slight slow flow at night though otherwise feels well.  He sees urology later this month.  He is following with hematology for anemia and thrombocytopenia.  It appears that these things trended down slightly at his last visit.  He has not had iron studies in some time.  MCV is normal.  Possibly anemia of chronic disease.  Hematology is monitoring.     Social History   Tobacco Use  Smoking Status Former Smoker  . Packs/day: 1.00  . Years: 65.00  . Pack years: 65.00  . Types: Cigarettes  . Last attempt to quit: 04/10/2011  . Years since quitting: 6.4  Smokeless Tobacco Former Systems developer  . Types: Chew     ROS see history of present illness  Objective  Physical Exam Vitals:   09/14/17 0957  BP: (!) 138/58  Pulse: 81  Temp: 98.4 F (36.9 C)  SpO2: 92%    BP Readings from Last 3 Encounters:  09/14/17 (!) 138/58  08/31/17 140/64  08/22/17 (!) 105/59   Wt Readings from Last 3 Encounters:  09/14/17 194 lb 12.8 oz (88.4 kg)  08/31/17 194 lb 8 oz (88.2 kg)  08/22/17 194 lb 11.2 oz (88.3 kg)    Physical Exam  Constitutional: No distress.  Cardiovascular: Normal rate, regular rhythm and normal heart  sounds.  Pulmonary/Chest: Effort normal and breath sounds normal.  Musculoskeletal: He exhibits edema (1+ pitting bilateral LE to mid shin with hemosiderin deposition changes).  Neurological: He is alert.  Skin: Skin is warm and dry. He is not diaphoretic.   Diabetic Foot Exam - Simple   Simple Foot Form Diabetic Foot exam was performed with the following findings:  Yes 09/14/2017 10:54 AM  Visual Inspection See comments:  Yes Sensation Testing Intact to touch and monofilament testing bilaterally:  Yes Pulse Check Posterior Tibialis and Dorsalis pulse intact bilaterally:  Yes Comments Thickened yellow toenails bilaterally, no other skin breakdown, deformities, or ulcerations      Assessment/Plan: Please see individual problem list.  Iron deficiency anemia Recent hemoglobin and platelets have trended down slightly.  We will recheck those today and check an iron level.  BPH (benign prostatic hyperplasia) He will continue to see urology.  He sees them this month.  Diabetes ([place]) Seems to be adequately controlled for age.  We will continue current regimen.  We will check an A1c.  Foot exam completed.  Essential hypertension Well-controlled for age.  Continue current regimen.  Edema is likely venous insufficiency.  Acute cystitis with hematuria Microscopic hematuria and proteinuria noted on recent UA.  Symptoms have resolved with treatment with Cipro.  Urinalysis abnormal today.  Will send for culture and microscopy.   Orders Placed This Encounter  Procedures  .  Urine Culture  . CBC  . Iron, TIBC and Ferritin Panel  . HgB A1c  . Urine Microscopic Only  . POCT Urinalysis Dipstick    No orders of the defined types were placed in this encounter.    [name], MD [place]

## 2017-09-14 NOTE — Assessment & Plan Note (Signed)
Recent hemoglobin and platelets have trended down slightly.  We will recheck those today and check an iron level.

## 2017-09-14 NOTE — Patient Instructions (Signed)
Nice to see you. We will check some lab work and her urine today. Please keep your appointment with urology.

## 2017-09-14 NOTE — Assessment & Plan Note (Addendum)
Microscopic hematuria and proteinuria noted on recent UA.  Symptoms have resolved with treatment with Cipro.  Urinalysis abnormal today.  Will send for culture and microscopy.

## 2017-09-15 ENCOUNTER — Other Ambulatory Visit: Payer: Self-pay | Admitting: Family Medicine

## 2017-09-15 LAB — URINE CULTURE
MICRO NUMBER: 90681804
SPECIMEN QUALITY: ADEQUATE

## 2017-09-15 LAB — IRON,TIBC AND FERRITIN PANEL
%SAT: 13 % (calc) — ABNORMAL LOW (ref 15–60)
Ferritin: 23 ng/mL (ref 20–380)
Iron: 47 ug/dL — ABNORMAL LOW (ref 50–180)
TIBC: 365 mcg/dL (calc) (ref 250–425)

## 2017-09-15 MED ORDER — EMPAGLIFLOZIN 10 MG PO TABS: 10.0000 mg | ORAL_TABLET | Freq: Every day | ORAL | 2 refills | Status: DC

## 2017-09-18 ENCOUNTER — Telehealth: Payer: Self-pay | Admitting: Family Medicine

## 2017-09-18 NOTE — Telephone Encounter (Unsigned)
Copied from [place]. Topic: Quick Communication - See Telephone Encounter >> Sep 18, 2017  3:25 PM Percell Belt A wrote: CRM for notification. See Telephone encounter for: 09/18/17.  Pt daughter called in and has several questions about his meds.  She would like to know if nurse could call her back about these med?  She also stated pt was under the impression there would be a med called in for blood in his urine .  Best number [phone]

## 2017-09-18 NOTE — Telephone Encounter (Signed)
Patient verified that he is supposed to start jardiance, informed patient that he is supposed to start the jardiance

## 2017-09-18 NOTE — Telephone Encounter (Signed)
Please advise 

## 2017-09-19 ENCOUNTER — Other Ambulatory Visit: Payer: Self-pay | Admitting: Family Medicine

## 2017-09-19 DIAGNOSIS — R809 Proteinuria, unspecified: Secondary | ICD-10-CM

## 2017-09-27 ENCOUNTER — Other Ambulatory Visit: Payer: PPO

## 2017-09-27 DIAGNOSIS — C61 Malignant neoplasm of prostate: Secondary | ICD-10-CM | POA: Diagnosis not present

## 2017-09-27 DIAGNOSIS — D696 Thrombocytopenia, unspecified: Secondary | ICD-10-CM

## 2017-09-28 ENCOUNTER — Ambulatory Visit (INDEPENDENT_AMBULATORY_CARE_PROVIDER_SITE_OTHER): Payer: PPO

## 2017-09-28 VITALS — BP 103/57 | HR 87

## 2017-09-28 DIAGNOSIS — R300 Dysuria: Secondary | ICD-10-CM

## 2017-09-28 LAB — MICROSCOPIC EXAMINATION: WBC, UA: >30 /hpf — ABNORMAL HIGH (ref 0–5)

## 2017-09-28 LAB — URINALYSIS, COMPLETE
Bilirubin, UA: NEGATIVE
Ketones, UA: NEGATIVE
Nitrite, UA: NEGATIVE
Specific Gravity, UA: 1.01 (ref 1.005–1.030)
Urobilinogen, Ur: 0.2 mg/dL (ref 0.2–1.0)
pH, UA: 5 (ref 5.0–7.5)

## 2017-09-28 LAB — PSA: PROSTATE SPECIFIC AG, SERUM: 3.6 ng/mL (ref 0.0–4.0)

## 2017-09-28 NOTE — Progress Notes (Signed)
Patient present today complaining of possible UTI, symptoms include urinary frequency and dysuria. UA today looks similar to UA done on 09-14-17 which did not show infection. Per Dr. Bernardo Heater will wait on culture to treat. Left pt mess to notifiy

## 2017-09-29 ENCOUNTER — Ambulatory Visit: Payer: PPO

## 2017-10-02 LAB — CULTURE, URINE COMPREHENSIVE

## 2017-10-03 ENCOUNTER — Telehealth: Payer: Self-pay | Admitting: Family Medicine

## 2017-10-03 NOTE — Telephone Encounter (Signed)
Spoke to [place] at [employer], she added code [id number] to add yeast on lab

## 2017-10-03 NOTE — Telephone Encounter (Signed)
-----   Message from Abbie Sons, MD sent at 10/03/2017  7:11 AM EDT ----- Urine culture was growing yeast.  Please contact lab for further identification.

## 2017-10-07 NOTE — Progress Notes (Signed)
10/09/2017 4:27 PM   Matthew Miles. [birth year] [id number]  Referring provider: [name], MD [address] [place],  [address]  Chief Complaint  Patient presents with  . Prostate Cancer    HPI: Patient is an 84 year old Caucasian male with a history of prostate cancer and a rising PSA who presents today for follow-up.  1) PCa - Pt with a history of prostate cancer and rising PSA after XRT. He had radiation done in "early 1990s or 2000's". He saw Dr. [name]. His PSA was 0.93 Apr 2014 and had risen to 2.14 Jan 2016 -- PSADT = 2.4 years. An Apr 2014 CT A/P was done which showed no mets, gold seeds in prostate. PSA slightly rose to 2.7 in August 2018.   PSA Trend  0.93 in 07/2012  2.3 in 11/2015  2.50 in 01/2016  2.3 in 05/2016  2.7 in 11/2016  3.1 in 05/2017  3.6 in 09/2017   BPH WITH LUTS  (prostate and/or bladder) IPSS score: 30/6  PVR: 78 mL   Major complaint(s):  Urgency and urge incontinence x months.  He now has to wear a pad as it comes on suddenly.   Denies any dysuria, hematuria or suprapubic pain.   Currently taking: alfuzosin 10 mg daily.    Denies any recent fevers, chills, nausea or vomiting.  Father died of prostate cancer.  Brother had RRP for prostate cancer.    IPSS    Row Name 10/09/17 1500         International Prostate Symptom Score   How often have you had the sensation of not emptying your bladder?  Almost always     How often have you had to urinate less than every two hours?  Almost always     How often have you found you stopped and started again several times when you urinated?  Almost always     How often have you found it difficult to postpone urination?  Almost always     How often have you had a weak urinary stream?  Almost always     How often have you had to strain to start urination?  Not at All     How many times did you typically get up at night to urinate?  5 Times     Total IPSS Score  30       Quality of Life due to urinary symptoms   If you were to spend the rest of your life with your urinary condition just the way it is now how would you feel about that?  Terrible        Score:  1-7 Mild 8-19 Moderate 20-35 Severe   PMH: Past Medical History:  Diagnosis Date  . Allergy   . Arthritis   . CHF (congestive heart failure) ([place])   . Chicken pox   . Cholecystitis   . Colon polyps   . COPD (chronic obstructive pulmonary disease) ([place])   . Coronary artery disease   . Emphysema of lung ([place])   . GERD (gastroesophageal reflux disease)   . Heart murmur   . Hematemesis/vomiting blood 04/10/11  . Hypercholesterolemia   . Mild hypertension   . Pancreatitis   . Prostate cancer ([place])   . Prostate cancer [facility])    radiation therapy   . Smoker   . Ulcer   . Upper GI bleed 1980    Surgical History: Past Surgical History:  Procedure Laterality Date  . APPENDECTOMY    .  CARDIAC CATHETERIZATION  May 2002 and Feb 2013   [facility]; no stents   . CATARACT EXTRACTION    . CHOLECYSTECTOMY    . CIRCUMCISION    . COLONOSCOPY    . HEMORRHOID SURGERY    . SP CHOLECYSTOMY    . STOMACH SURGERY     bleeding ulcers, followed by Dr. [name]    Home Medications:  Allergies as of 10/09/2017      Reactions   Sulfa Antibiotics    GI upset   Tradjenta [linagliptin] Other (See Comments)   Hair loss      Medication List        Accurate as of 10/09/17  4:27 PM. Always use your most recent med list.          alfuzosin 10 MG 24 hr tablet Commonly known as:  UROXATRAL Take 10 mg by mouth daily with breakfast.   amoxicillin-clavulanate 875-125 MG tablet Commonly known as:  AUGMENTIN Take 1 tablet by mouth every 12 (twelve) hours.   aspirin 81 MG tablet Take 81 mg by mouth daily.   B-12 1000 MCG Tbcr Take 1 tablet by mouth daily.   carvedilol 6.25 MG tablet Commonly known as:  COREG Take 1 tablet (6.25 mg total) by mouth 2 (two) times daily.   empagliflozin 10 MG Tabs  tablet Commonly known as:  JARDIANCE Take 10 mg by mouth daily.   glipiZIDE 10 MG 24 hr tablet Commonly known as:  GLUCOTROL XL TAKE 1 TABLET(10 MG) BY MOUTH TWICE DAILY   JANUVIA 50 MG tablet Generic drug:  sitaGLIPtin   metFORMIN 500 MG 24 hr tablet Commonly known as:  GLUCOPHAGE-XR TAKE 2 TABLETS(1000 MG) BY MOUTH TWICE DAILY   nitroGLYCERIN 0.4 MG SL tablet Commonly known as:  NITROSTAT Place 1 tablet (0.4 mg total) under the tongue every 5 (five) minutes as needed.   ONETOUCH VERIO test strip Generic drug:  glucose blood USE TO TEST BLOOD SUGAR TWICE DAILY   pantoprazole 20 MG tablet Commonly known as:  PROTONIX TAKE 1 TABLET(20 MG) BY MOUTH DAILY   potassium chloride SA 20 MEQ tablet Commonly known as:  K-DUR,KLOR-CON TAKE 1 TABLET(20 MEQ) BY MOUTH DAILY   pravastatin 40 MG tablet Commonly known as:  PRAVACHOL TAKE 1 TABLET(40 MG) BY MOUTH DAILY   PROAIR HFA 108 (90 Base) MCG/ACT inhaler Generic drug:  albuterol INHALE 2 PUFFS INTO THE LUNGS EVERY 6 HOURS AS NEEDED FOR WHEEZING OR SHORTNESS OF BREATH   SPIRIVA RESPIMAT 2.5 MCG/ACT Aers Generic drug:  Tiotropium Bromide Monohydrate INHALE 2 PUFFS INTO THE LUNGS DAILY   torsemide 10 MG tablet Commonly known as:  DEMADEX Take 1 tablet (10 mg total) by mouth 2 (two) times daily as needed.       Allergies:  Allergies  Allergen Reactions  . Sulfa Antibiotics     GI upset  . Tradjenta [Linagliptin] Other (See Comments)    Hair loss    Family History: Family History  Problem Relation Age of Onset  . Prostate cancer Father   . Liver cancer Brother   . Prostate cancer Brother   . Emphysema Sister        smoker    Social History:  reports that he quit smoking about 6 years ago. His smoking use included cigarettes. He has a 65.00 pack-year smoking history. He has quit using smokeless tobacco. His smokeless tobacco use included chew. He reports that he drinks alcohol. He reports that he does not use  drugs.  ROS: UROLOGY Frequent Urination?: Yes Hard to postpone urination?: Yes Burning/pain with urination?: Yes Get up at night to urinate?: Yes Leakage of urine?: Yes Urine stream starts and stops?: Yes Trouble starting stream?: Yes Do you have to strain to urinate?: No Blood in urine?: No Urinary tract infection?: Yes Sexually transmitted disease?: No Injury to kidneys or bladder?: No Painful intercourse?: No Weak stream?: No Erection problems?: No Penile pain?: No  Gastrointestinal Nausea?: No Vomiting?: No Indigestion/heartburn?: No Diarrhea?: No Constipation?: No  Constitutional Fever: No Night sweats?: No Weight loss?: No Fatigue?: No  Skin Skin rash/lesions?: No Itching?: No  Eyes Blurred vision?: No Double vision?: No  Ears/Nose/Throat Sore throat?: No Sinus problems?: No  Hematologic/Lymphatic Easy bruising?: Yes  Cardiovascular Leg swelling?: Yes Chest pain?: No  Respiratory Cough?: No Shortness of breath?: Yes  Endocrine Excessive thirst?: Yes  Musculoskeletal Back pain?: Yes Joint pain?: Yes  Neurological Headaches?: No Dizziness?: No  Psychologic Depression?: No Anxiety?: No  Physical Exam: BP 113/64 (BP Location: Right Arm, Patient Position: Sitting, Cuff Size: Normal)   Pulse 86   Ht 5\' 2"  (1.575 m)   Wt 191 lb 14.4 oz (87 kg)   BMI 35.10 kg/m   Constitutional: Well nourished. Alert and oriented, No acute distress. HEENT: [place] AT, moist mucus membranes. Trachea midline, no masses. Cardiovascular: No clubbing, cyanosis, or edema. Respiratory: Normal respiratory effort, no increased work of breathing. GI: Abdomen is soft, non tender, non distended, no abdominal masses. Liver and spleen not palpable.  No hernias appreciated.  Stool sample for occult testing is not indicated.   GU: No CVA tenderness.  No bladder fullness or masses.  Patient with uncircumcised phallus.  Foreskin easily retracted.  Coronal adhesions  present.  Urethral meatus is patent.  No penile discharge. No penile lesions or rashes. Scrotum without lesions, cysts, rashes and/or edema.  Testicles are located scrotally bilaterally. No masses are appreciated in the testicles. Left and right epididymis are normal. Rectal: Patient with  normal sphincter tone. Anus and perineum without scarring or rashes. No rectal masses are appreciated. Prostate is approximately 45 grams, firn, no nodules are appreciated. Seminal vesicles are normal. Skin: No rashes, bruises or suspicious lesions. Lymph: No cervical or inguinal adenopathy. Neurologic: Grossly intact, no focal deficits, moving all 4 extremities. Psychiatric: Normal mood and affect.   Laboratory Data: Lab Results  Component Value Date   WBC 6.4 09/14/2017   HGB 10.8 (L) 09/14/2017   HCT 32.2 (L) 09/14/2017   MCV 84.9 09/14/2017   PLT 115.0 (L) 09/14/2017    Lab Results  Component Value Date   CREATININE 1.29 08/03/2017    Lab Results  Component Value Date   PSA 2.50 02/04/2016   PSA 0.93 07/11/2012    No results found for: TESTOSTERONE  Lab Results  Component Value Date   HGBA1C 8.7 (H) 09/14/2017    Urinalysis > 30 WBC's.  3-10 RBC's.  Moderate bacteria.  See Epic.     Assessment & Plan:    1. Prostate cancer PSA continues to rise -explained to the patient that this is concerning for the return of the prostate cancer and that it would be recommended to start androgen deprivation therapy at this time and pursue imaging studies to evaluate for metastases Patient is agreeable.  Will schedule bone scan and CT scans. Will return for results and to start ADT therapy   2. BPH with LUTS IPSS score is 30/6, it is worsening Continue conservative management, avoiding bladder irritants and  timed voiding's Most bothersome symptoms is/are urge incontinence  Continue alfuzosin 10 mg daily  3. Urge incontinence Urine is suspicious for infection - will send for culture -  started on Augmentin - will adjust if needed once culture results are available Last culture grew out yeast and request for yeast culture is also given   Return for for bone scan and CT scan reports .  Zara Council, PA-C  [facility] [address] [place], [place] [address] [phone]

## 2017-10-09 ENCOUNTER — Ambulatory Visit (INDEPENDENT_AMBULATORY_CARE_PROVIDER_SITE_OTHER): Payer: PPO | Admitting: Urology

## 2017-10-09 ENCOUNTER — Encounter: Payer: Self-pay | Admitting: Urology

## 2017-10-09 VITALS — BP 113/64 | HR 86 | Ht 62.0 in | Wt 191.9 lb

## 2017-10-09 DIAGNOSIS — N401 Enlarged prostate with lower urinary tract symptoms: Secondary | ICD-10-CM | POA: Diagnosis not present

## 2017-10-09 DIAGNOSIS — C61 Malignant neoplasm of prostate: Secondary | ICD-10-CM | POA: Diagnosis not present

## 2017-10-09 DIAGNOSIS — R3915 Urgency of urination: Secondary | ICD-10-CM | POA: Diagnosis not present

## 2017-10-09 DIAGNOSIS — N138 Other obstructive and reflux uropathy: Secondary | ICD-10-CM | POA: Diagnosis not present

## 2017-10-09 LAB — BLADDER SCAN AMB NON-IMAGING

## 2017-10-09 MED ORDER — AMOXICILLIN-POT CLAVULANATE 875-125 MG PO TABS: 1.0000 | ORAL_TABLET | Freq: Two times a day (BID) | ORAL | 0 refills | Status: DC

## 2017-10-10 ENCOUNTER — Telehealth: Payer: Self-pay | Admitting: Urology

## 2017-10-10 LAB — URINALYSIS, COMPLETE
Bilirubin, UA: NEGATIVE
Ketones, UA: NEGATIVE
Nitrite, UA: NEGATIVE
PH UA: 5.5 (ref 5.0–7.5)
Specific Gravity, UA: 1.01 (ref 1.005–1.030)
Urobilinogen, Ur: 0.2 mg/dL (ref 0.2–1.0)

## 2017-10-10 LAB — MICROSCOPIC EXAMINATION

## 2017-10-10 NOTE — Telephone Encounter (Signed)
I left a message for Barbara to call me back.

## 2017-10-10 NOTE — Telephone Encounter (Signed)
Patient's daughter, [name] [phone]), called the office today requesting a call from Zara Council.  She has questions about his visit on 10/09/17 that he could not answer.  She was not able to be with him at his appointment.

## 2017-10-12 LAB — CULTURE, URINE COMPREHENSIVE

## 2017-10-13 ENCOUNTER — Telehealth: Payer: Self-pay

## 2017-10-13 LAB — ORGANISM IDENTIFICATION, YEAST

## 2017-10-13 LAB — SPECIMEN STATUS REPORT

## 2017-10-13 MED ORDER — LEVOFLOXACIN 500 MG PO TABS: 500.0000 mg | ORAL_TABLET | Freq: Every day | ORAL | 0 refills | Status: AC

## 2017-10-13 NOTE — Telephone Encounter (Signed)
-----   Message from Nori Riis, PA-C sent at 10/13/2017  8:00 AM EDT ----- Please let Mr.  Fite know that his urine culture is positive.  We need to change antibiotics at this time.  He needs to stop the Augmentin and start Levaquin 500 mg, one tablet daily for seven days.

## 2017-10-13 NOTE — Telephone Encounter (Signed)
Pt informed, states his understanding.

## 2017-10-18 LAB — YEAST ONLY, CULTURE

## 2017-10-26 ENCOUNTER — Ambulatory Visit
Admission: RE | Admit: 2017-10-26 | Discharge: 2017-10-26 | Disposition: A | Discharge status: Home / Self Care | Payer: PPO | Source: Ambulatory Visit | Attending: Urology | Admitting: Urology

## 2017-10-26 ENCOUNTER — Encounter
Admission: RE | Admit: 2017-10-26 | Discharge: 2017-10-26 | Disposition: A | Discharge status: Home / Self Care | Payer: PPO | Source: Ambulatory Visit | Attending: Urology | Admitting: Urology

## 2017-10-26 DIAGNOSIS — C61 Malignant neoplasm of prostate: Secondary | ICD-10-CM | POA: Insufficient documentation

## 2017-10-26 DIAGNOSIS — I2510 Atherosclerotic heart disease of native coronary artery without angina pectoris: Secondary | ICD-10-CM | POA: Diagnosis not present

## 2017-10-26 DIAGNOSIS — R590 Localized enlarged lymph nodes: Secondary | ICD-10-CM | POA: Diagnosis not present

## 2017-10-26 DIAGNOSIS — I700 Atherosclerosis of aorta: Secondary | ICD-10-CM | POA: Insufficient documentation

## 2017-10-26 DIAGNOSIS — I2584 Coronary atherosclerosis due to calcified coronary lesion: Secondary | ICD-10-CM | POA: Insufficient documentation

## 2017-10-26 DIAGNOSIS — K4090 Unilateral inguinal hernia, without obstruction or gangrene, not specified as recurrent: Secondary | ICD-10-CM | POA: Diagnosis not present

## 2017-10-26 HISTORY — DX: Type 2 diabetes mellitus without complications: E11.9

## 2017-10-26 LAB — POCT I-STAT CREATININE: Creatinine, Ser: 1.4 mg/dL — ABNORMAL HIGH (ref 0.61–1.24)

## 2017-10-26 IMAGING — CT CT ABD-PEL WO/W CM
2 of 10 series · 11 of 46 positions shown, 17 images · IV contrast (omnipaque)
Comparison: [DATE]

CLINICAL DATA: History of prostate cancer with rising PSA.

EXAM:
CT ABDOMEN AND PELVIS WITHOUT AND WITH CONTRAST
TECHNIQUE: Multidetector CT imaging of the abdomen and pelvis was performed
following the standard protocol before and following the bolus
administration of intravenous contrast.
CONTRAST:  80mL OMNIPAQUE IOHEXOL 300 MG/ML  SOLN

[Series 2: axial st · axial · 0.84mm/px · z∈[-447,-42]mm · 8 of 105 slices shown, 13 images]
[im 12/105  soft-tissue]
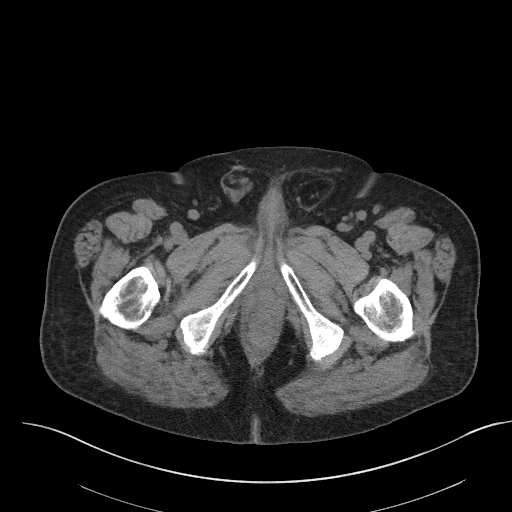
[im 12/105  bone]
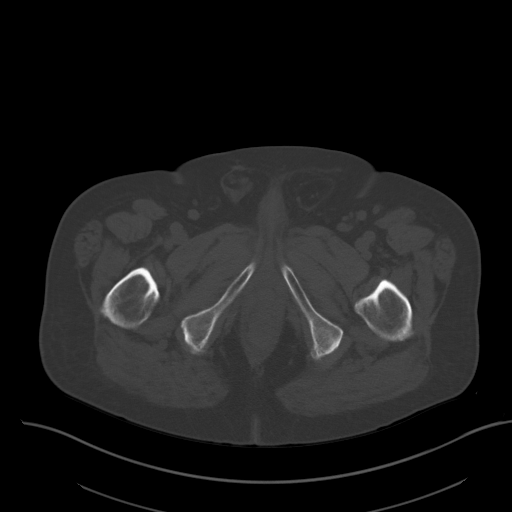
[im 24/105  soft-tissue]
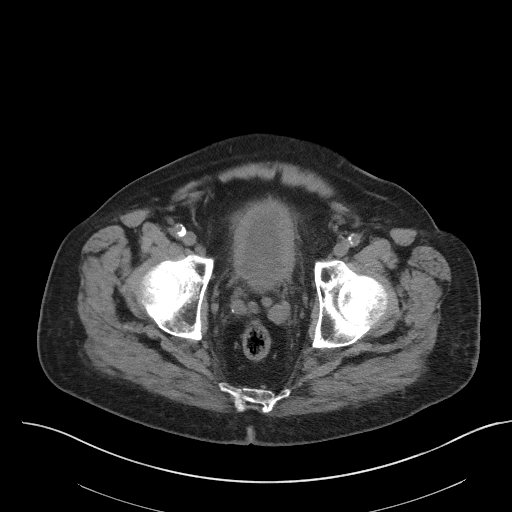
[im 35/105  soft-tissue]
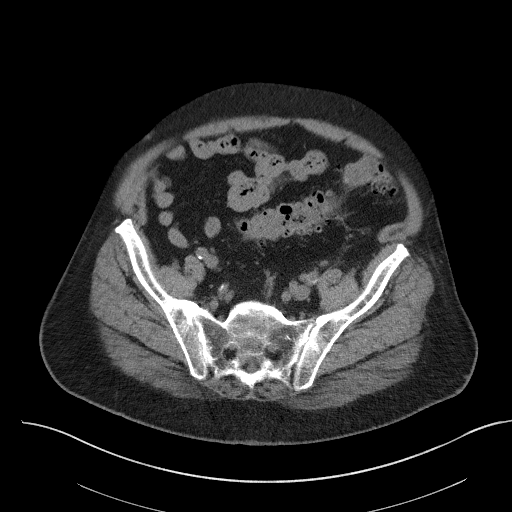
[im 47/105  soft-tissue]
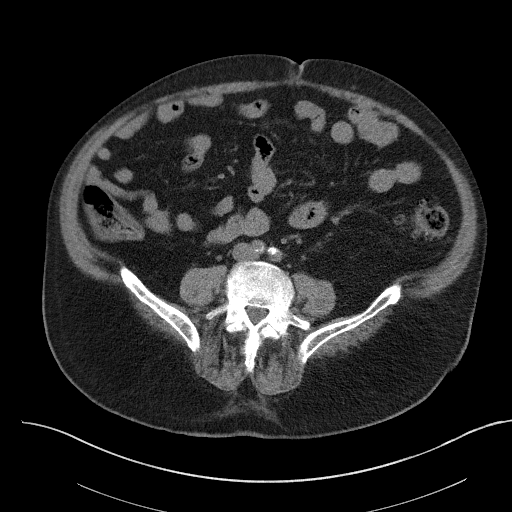
[im 58/105  soft-tissue]
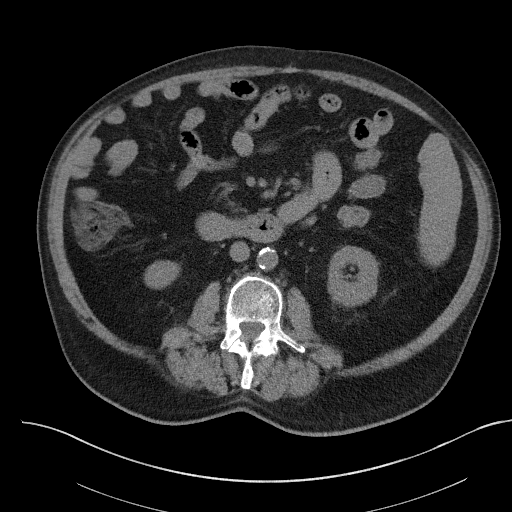
[im 58/105  lung]
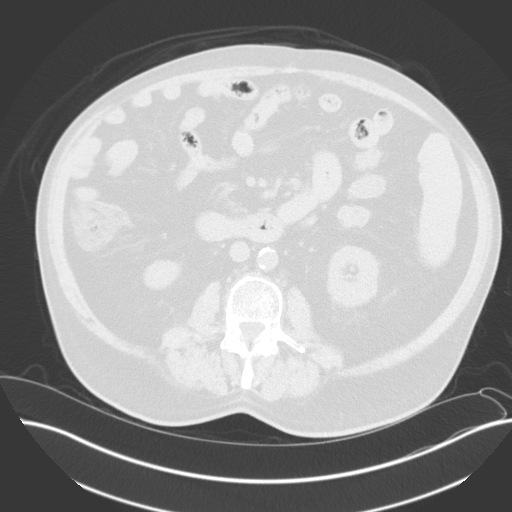
[im 70/105  soft-tissue]
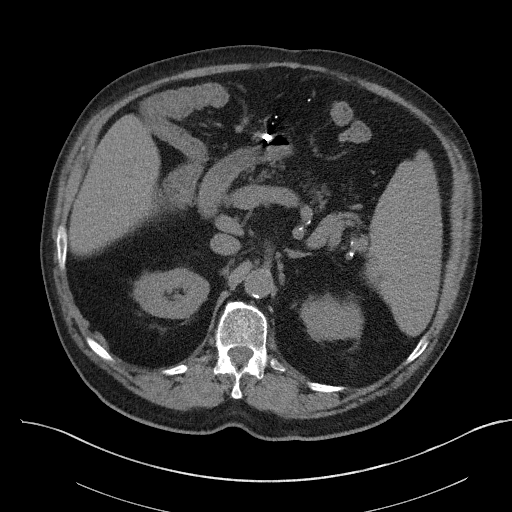
[im 70/105  lung]
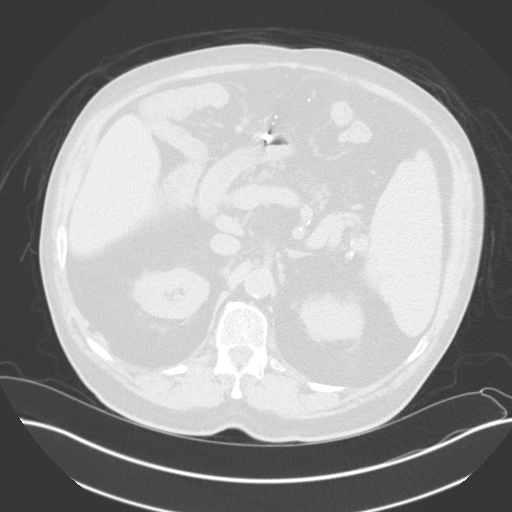
[im 81/105  soft-tissue]
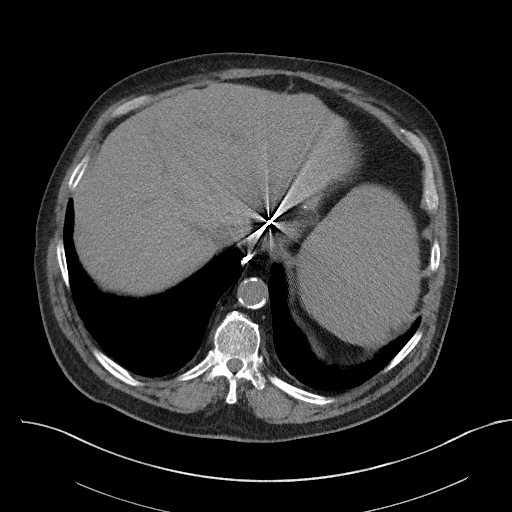
[im 81/105  lung]
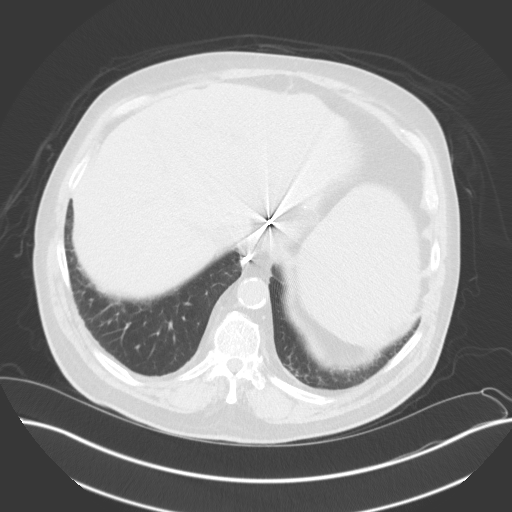
[im 93/105  soft-tissue]
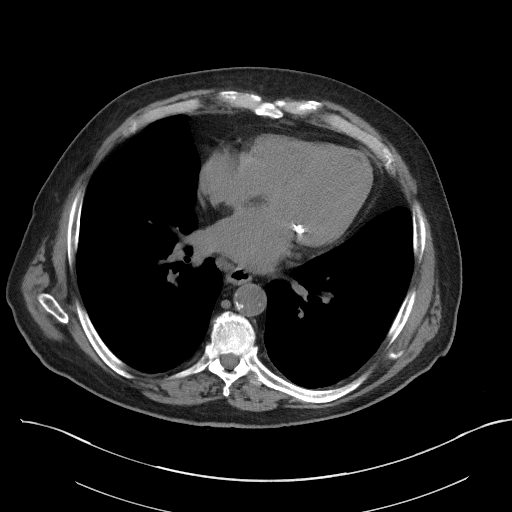
[im 93/105  lung]
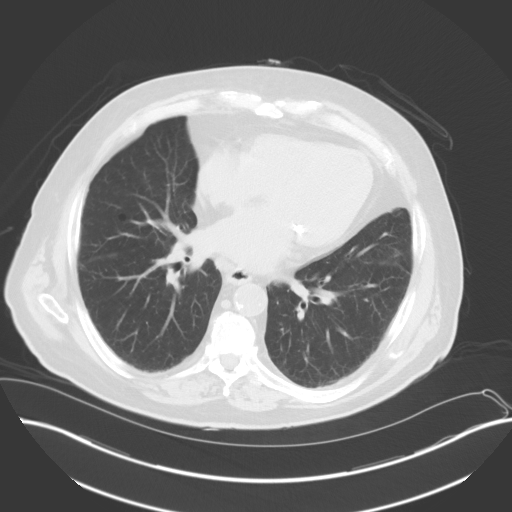

[Series 8: coronal st · coronal · 0.87mm/px · 3 of 109 slices shown, 4 images]
[im 28/109  soft-tissue]
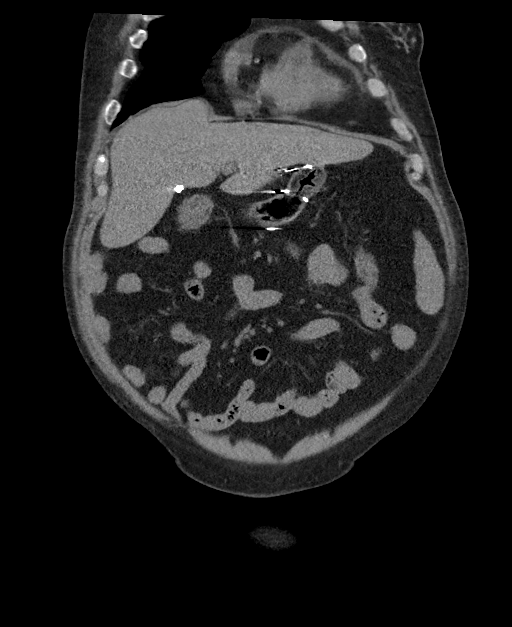
[im 55/109  soft-tissue]
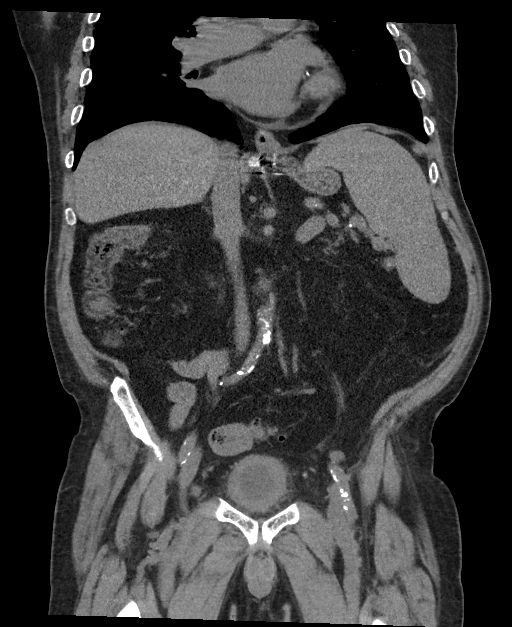
[im 55/109  bone]
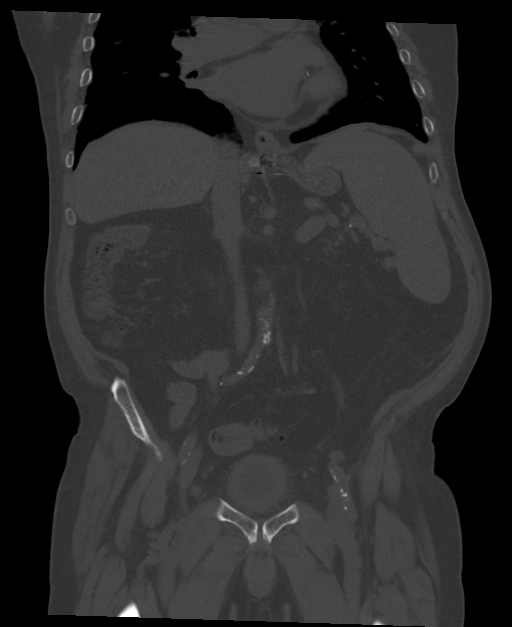
[im 82/109  soft-tissue]
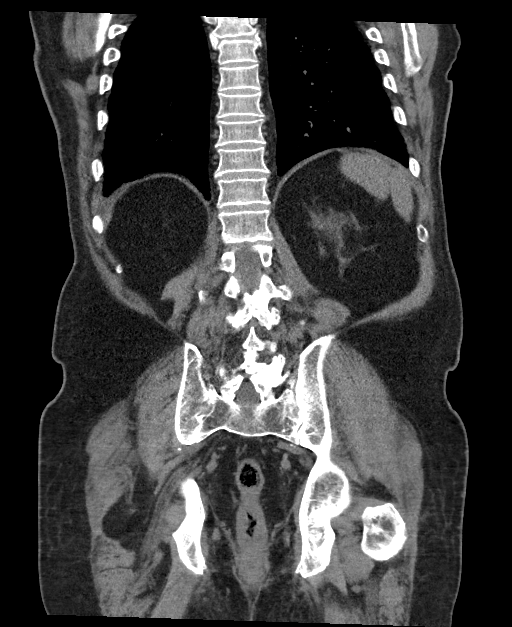

[11 of 46 positions shown; findings below may reference images not displayed]

FINDINGS: Lower chest: Enlarged lower mediastinal and hilar lymph nodes
worrisome for metastatic disease. These nodes were present on the
prior CT scan but have enlarged.

12 mm right hilar node on image number 10 previously measured 8 mm.

12 mm paraesophageal node on image number 12 previously measured 6
mm.

Left hilar node on image number 7 measures 12 mm and previously
measured 5.5 mm.

Hepatobiliary: No focal hepatic lesions or intrahepatic biliary
dilatation. Small calcified granuloma noted in the right hepatic
lobe. The gallbladder is surgically absent. No common bile duct
dilatation.

Pancreas: Prominent fatty changes but no mass, inflammation or
ductal dilatation.

Spleen: Stable splenomegaly.  Stable small low-attenuation lesions.

Adrenals/Urinary Tract: The adrenal glands and kidneys are
unremarkable. No renal, ureteral or bladder calculi or mass. There
is diffuse wall thickening and enhancement of the bladder suggesting
cystitis.

Stomach/Bowel: The stomach, duodenum, small bowel and colon are
unremarkable. No acute inflammatory changes, mass lesions or
obstructive findings. The terminal ileum is normal. Significant
descending and sigmoid colon diverticulosis without findings for
acute diverticulitis.

Vascular/Lymphatic: Advanced atherosclerotic calcifications
involving the aorta and branch vessels. No aneurysm or dissection.
The major venous structures are patent.

Enlarged celiac axis and periportal lymph nodes. Celiac axis node on
image number 31 measures 15 mm and previously measured 11 mm.
Periportal lymph node on image number 32 measures 18 mm and
previously measured 14.5 mm.

No retroperitoneal lymphadenopathy. Small scattered lymph nodes are
stable. No pelvic adenopathy. Small scattered lymph nodes are
stable. No inguinal adenopathy.

Reproductive: Moderate prostate gland enlargement and few small
brachytherapy seeds. The seminal vesicles are grossly normal.

Other: No abdominal wall hernia. Bilateral inguinal hernias
containing fat and fluid.

Musculoskeletal: No evidence of sclerotic metastatic bone disease.
IMPRESSION: 1. Enlarged infrahilar and lower mediastinal lymph nodes in the
chest worrisome for metastatic prostate cancer involvement.
2. Slight enlargement of upper abdominal lymph nodes but no pelvic
or retroperitoneal adenopathy.
3. No findings to suggest sclerotic osseous metastatic disease.
4. Marked diffuse symmetric bladder wall thickening and enhancement
and surrounding inflammation suggesting cystitis. I suppose this
also could be due to radiation change.
5. Advanced atherosclerotic calcifications involving the thoracic
and abdominal aorta and branch vessels including the coronary
arteries.

## 2017-10-26 IMAGING — NM NM BONE WHOLE BODY
2 series · 6 of 6 positions shown · non-contrast
Comparison: CT [DATE].

CLINICAL DATA: Prostate cancer. History of arthritis and both
hands.

EXAM:
NUCLEAR MEDICINE WHOLE BODY BONE SCAN
TECHNIQUE: Whole body anterior and posterior images were obtained approximately
3 hours after intravenous injection of radiopharmaceutical.
RADIOPHARMACEUTICALS:  23.1 mCi [OS] MDP IV

[Series 1000: 3 hr wholebody · 2.40mm/px · 2 of 2 frames shown]
[frame 1/2]
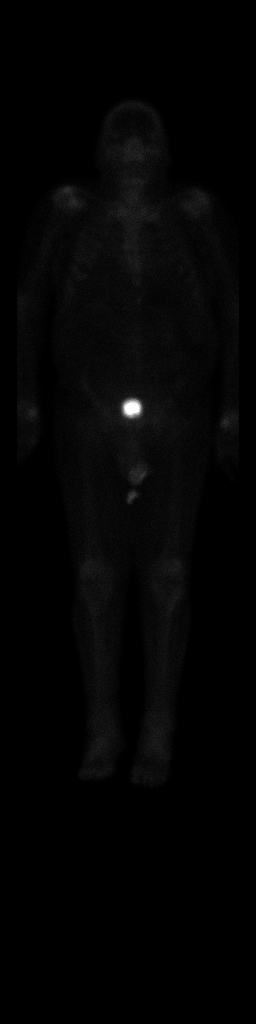
[frame 2/2]
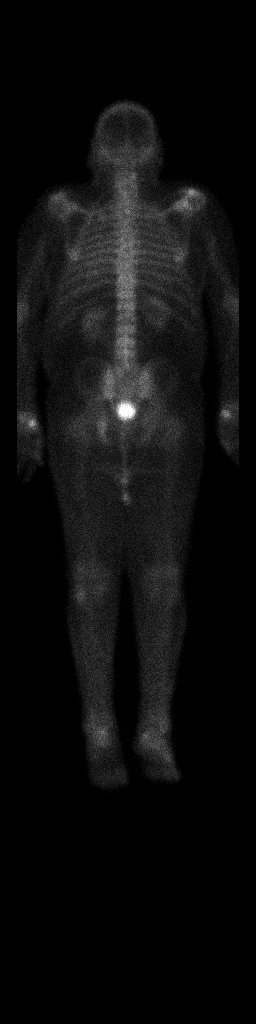

[Series 1000: statics · 2.40mm/px · 2 acquisitions, 4 frames shown]
[im 1/2]
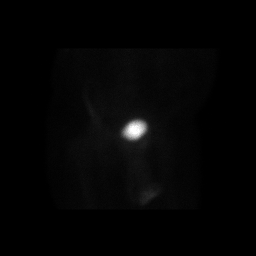
[im 1/2]
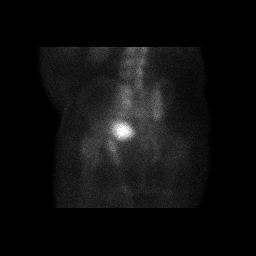
[im 2/2]
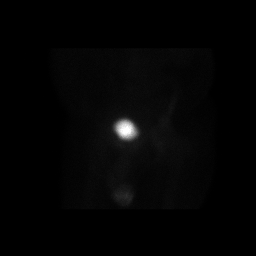
[im 2/2]
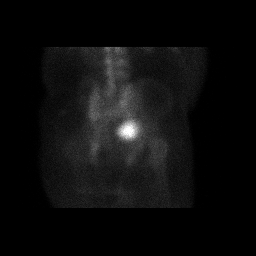

[6 of 6 positions shown; findings below may reference images not displayed]

FINDINGS: Bilateral renal function excretion. Increased areas of activity
noted over the right shoulder. This is most likely secondary to
degenerative disease. Right shoulder can be obtained to further
evaluate. Mild increased activity noted over the left ischium.
Pelvic series suggested for further evaluation. Areas of increased
activity noted about both wrists, right knee, both ankles most
likely degenerative.
IMPRESSION: 1. Areas of increased activity noted about the right shoulder, most
likely degenerative, rights shoulder series can be obtained for
further evaluation.

2. Faint area of increased activity noted over the left ischium.
Pelvic series suggested for further evaluation.

3. Areas of increased activity noted about both wrists, right knee,
both ankles most likely degenerative.

## 2017-10-26 MED ORDER — TECHNETIUM TC 99M MEDRONATE IV KIT
23.1000 | PACK | Freq: Once | INTRAVENOUS | Status: AC | PRN
  Administered 2017-10-26: 23.1 via INTRAVENOUS

## 2017-10-26 MED ORDER — IOHEXOL 300 MG/ML  SOLN
80.0000 mL | Freq: Once | INTRAMUSCULAR | Status: AC | PRN
Start: 2017-10-26 — End: 2017-10-26
  Administered 2017-10-26: 80 mL via INTRAVENOUS

## 2017-10-30 ENCOUNTER — Other Ambulatory Visit: Payer: Self-pay | Admitting: Nephrology

## 2017-10-30 DIAGNOSIS — R3129 Other microscopic hematuria: Secondary | ICD-10-CM | POA: Diagnosis not present

## 2017-10-30 DIAGNOSIS — I10 Essential (primary) hypertension: Secondary | ICD-10-CM | POA: Diagnosis not present

## 2017-10-30 DIAGNOSIS — N183 Chronic kidney disease, stage 3 unspecified: Secondary | ICD-10-CM

## 2017-10-30 DIAGNOSIS — R809 Proteinuria, unspecified: Secondary | ICD-10-CM | POA: Diagnosis not present

## 2017-10-31 NOTE — Progress Notes (Signed)
11/01/2017 9:38 PM   Matthew Miles. 1928-08-19 [id number]  Referring provider: [name], MD [address] [place], [place] [address]  Chief Complaint  Patient presents with  . Follow-up    HPI: Patient is an 84 year old Caucasian male with a history of prostate cancer and a rising PSA who presents today for follow-up to discuss bone scan and CT results.    1) PCa - Pt with a history of prostate cancer and rising PSA after XRT. He had radiation done in "early 1990s or 2000's". He saw Dr. [name]. His PSA was 0.93 Apr 2014 and had risen to 2.14 Jan 2016 -- PSADT = 2.4 years. An Apr 2014 CT A/P was done which showed no mets, gold seeds in prostate. PSA slightly rose to 2.7 in August 2018.   PSA Trend  0.93 in 07/2012  2.3 in 11/2015  2.50 in 01/2016  2.3 in 05/2016  2.7 in 11/2016  3.1 in 05/2017  3.6 in 09/2017  Contrast study on 10/26/2017 noted enlarged infrahilar and lower mediastinal lymph nodes in the chest worrisome for metastatic prostate cancer involvement.  Slight enlargement of upper abdominal lymph nodes but no pelvic or retroperitoneal adenopathy.  No findings to suggest sclerotic osseous metastatic disease.  Marked diffuse symmetric bladder wall thickening and enhancement and surrounding inflammation suggesting cystitis. I suppose this also could be due to radiation change.  Advanced atherosclerotic calcifications involving the thoracic and abdominal aorta and branch vessels including the coronary arteries.  Bone scan on 10/26/2017 noted areas of increased activity noted about the right shoulder, most likely degenerative, rights shoulder series can be obtained for further evaluation.  Faint area of increased activity noted over the left ischium.  Pelvic series suggested for further evaluation.  Areas of increased activity noted about both wrists, right knee, both ankles most likely degenerative.   BPH WITH LUTS  (prostate and/or bladder) IPSS score:  30/6  PVR: 78 mL   Major complaint(s):  Frequency, dysuria, nocturia, urge incontinence, intermittency, straining to urinate, gross hematuria and a weak stream x months.  He now has to wear a pad as it comes on suddenly.   Denies any hematuria or suprapubic pain.   Currently taking: alfuzosin 10 mg daily.    Denies any recent fevers, chills, nausea or vomiting.  Father died of prostate cancer.  Brother had RRP for prostate cancer.    IPSS    Row Name 10/09/17 1500         International Prostate Symptom Score   How often have you had the sensation of not emptying your bladder?  Almost always     How often have you had to urinate less than every two hours?  Almost always     How often have you found you stopped and started again several times when you urinated?  Almost always     How often have you found it difficult to postpone urination?  Almost always     How often have you had a weak urinary stream?  Almost always     How often have you had to strain to start urination?  Not at All     How many times did you typically get up at night to urinate?  5 Times     Total IPSS Score  30       Quality of Life due to urinary symptoms   If you were to spend the rest of your life with your urinary condition  just the way it is now how would you feel about that?  Terrible        Score:  1-7 Mild 8-19 Moderate 20-35 Severe   PMH: Past Medical History:  Diagnosis Date  . Allergy   . Arthritis   . CHF (congestive heart failure) ([place])   . Chicken pox   . Cholecystitis   . Colon polyps   . COPD (chronic obstructive pulmonary disease) ([place])   . Coronary artery disease   . Diabetes mellitus without complication ([place])   . Emphysema of lung ([place])   . GERD (gastroesophageal reflux disease)   . Heart murmur   . Hematemesis/vomiting blood 04/10/11  . Hypercholesterolemia   . Mild hypertension   . Pancreatitis   . Prostate cancer ([place])   . Prostate cancer [facility])    radiation therapy     . Smoker   . Ulcer   . Upper GI bleed 1980    Surgical History: Past Surgical History:  Procedure Laterality Date  . APPENDECTOMY    . CARDIAC CATHETERIZATION  May 2002 and Feb 2013   [facility]; no stents   . CATARACT EXTRACTION    . CHOLECYSTECTOMY    . CIRCUMCISION    . COLONOSCOPY    . HEMORRHOID SURGERY    . SP CHOLECYSTOMY    . STOMACH SURGERY     bleeding ulcers, followed by Dr. [name]    Home Medications:  Allergies as of 11/01/2017      Reactions   Sulfa Antibiotics    GI upset   Tradjenta [linagliptin] Other (See Comments)   Hair loss      Medication List        Accurate as of 11/01/17 11:59 PM. Always use your most recent med list.          alfuzosin 10 MG 24 hr tablet Commonly known as:  UROXATRAL Take 10 mg by mouth daily with breakfast.   aspirin 81 MG tablet Take 81 mg by mouth daily.   B-12 1000 MCG Tbcr Take 1 tablet by mouth daily.   carvedilol 6.25 MG tablet Commonly known as:  COREG Take 1 tablet (6.25 mg total) by mouth 2 (two) times daily.   empagliflozin 10 MG Tabs tablet Commonly known as:  JARDIANCE Take 10 mg by mouth daily.   glipiZIDE 10 MG 24 hr tablet Commonly known as:  GLUCOTROL XL TAKE 1 TABLET(10 MG) BY MOUTH TWICE DAILY   JANUVIA 50 MG tablet Generic drug:  sitaGLIPtin   metFORMIN 500 MG 24 hr tablet Commonly known as:  GLUCOPHAGE-XR TAKE 2 TABLETS(1000 MG) BY MOUTH TWICE DAILY   nitroGLYCERIN 0.4 MG SL tablet Commonly known as:  NITROSTAT Place 1 tablet (0.4 mg total) under the tongue every 5 (five) minutes as needed.   nystatin cream Commonly known as:  MYCOSTATIN Apply 1 application topically 2 (two) times daily.   ONETOUCH VERIO test strip Generic drug:  glucose blood USE TO TEST BLOOD SUGAR TWICE DAILY   pantoprazole 20 MG tablet Commonly known as:  PROTONIX TAKE 1 TABLET(20 MG) BY MOUTH DAILY   potassium chloride SA 20 MEQ tablet Commonly known as:  K-DUR,KLOR-CON TAKE 1 TABLET(20 MEQ) BY MOUTH  DAILY   pravastatin 40 MG tablet Commonly known as:  PRAVACHOL TAKE 1 TABLET(40 MG) BY MOUTH DAILY   PROAIR HFA 108 (90 Base) MCG/ACT inhaler Generic drug:  albuterol INHALE 2 PUFFS INTO THE LUNGS EVERY 6 HOURS AS NEEDED FOR WHEEZING OR SHORTNESS OF BREATH  SPIRIVA RESPIMAT 2.5 MCG/ACT Aers Generic drug:  Tiotropium Bromide Monohydrate INHALE 2 PUFFS INTO THE LUNGS DAILY   torsemide 10 MG tablet Commonly known as:  DEMADEX Take 1 tablet (10 mg total) by mouth 2 (two) times daily as needed.       Allergies:  Allergies  Allergen Reactions  . Sulfa Antibiotics     GI upset  . Tradjenta [Linagliptin] Other (See Comments)    Hair loss    Family History: Family History  Problem Relation Age of Onset  . Prostate cancer Father   . Liver cancer Brother   . Prostate cancer Brother   . Emphysema Sister        smoker    Social History:  reports that he quit smoking about 6 years ago. His smoking use included cigarettes. He has a 65.00 pack-year smoking history. He has quit using smokeless tobacco. His smokeless tobacco use included chew. He reports that he drinks alcohol. He reports that he does not use drugs.  ROS: UROLOGY Frequent Urination?: Yes Hard to postpone urination?: Yes Burning/pain with urination?: Yes Get up at night to urinate?: Yes Leakage of urine?: Yes Urine stream starts and stops?: Yes Trouble starting stream?: Yes Do you have to strain to urinate?: Yes Blood in urine?: Yes Urinary tract infection?: Yes Sexually transmitted disease?: No Injury to kidneys or bladder?: No Painful intercourse?: No Weak stream?: Yes Erection problems?: No Penile pain?: No  Gastrointestinal Nausea?: No Vomiting?: No Indigestion/heartburn?: No Diarrhea?: No Constipation?: No  Constitutional Fever: No Night sweats?: No Weight loss?: No Fatigue?: No  Skin Skin rash/lesions?: No Itching?: No  Eyes Blurred vision?: No Double vision?:  No  Ears/Nose/Throat Sore throat?: No Sinus problems?: No  Hematologic/Lymphatic Swollen glands?: No Easy bruising?: Yes  Cardiovascular Leg swelling?: Yes Chest pain?: No  Respiratory Cough?: No Shortness of breath?: Yes  Endocrine Excessive thirst?: No  Musculoskeletal Back pain?: No Joint pain?: No  Neurological Headaches?: No Dizziness?: No  Psychologic Depression?: No Anxiety?: No  Physical Exam: BP 112/61 (BP Location: Left Arm, Patient Position: Sitting, Cuff Size: Normal)   Pulse 88   Ht 5\' 2"  (1.575 m)   Wt 195 lb 3.2 oz (88.5 kg)   BMI 35.70 kg/m   Constitutional: Well nourished. Alert and oriented, No acute distress. HEENT: [place] AT, moist mucus membranes. Trachea midline, no masses. Cardiovascular: No clubbing, cyanosis, or edema. Respiratory: Normal respiratory effort, no increased work of breathing. GI: Abdomen is soft, non tender, non distended, no abdominal masses. Liver and spleen not palpable.  No hernias appreciated.  Stool sample for occult testing is not indicated.   GU: No CVA tenderness.  No bladder fullness or masses.  Patient with uncircumcised phallus.  Foreskin easily retracted.  Coronal adhesions present.  Urethral meatus is patent.  No penile discharge. No penile lesions or rashes. Scrotum without lesions, cysts, rashes and/or edema.  Testicles are located scrotally bilaterally. No masses are appreciated in the testicles. Left and right epididymis are normal. Rectal: Patient with  normal sphincter tone. Anus and perineum without scarring or rashes. No rectal masses are appreciated. Prostate is approximately 45 grams, firn, no nodules are appreciated. Seminal vesicles are normal. Skin: No rashes, bruises or suspicious lesions. Lymph: No cervical or inguinal adenopathy. Neurologic: Grossly intact, no focal deficits, moving all 4 extremities. Psychiatric: Normal mood and affect.   Laboratory Data: Lab Results  Component Value Date   WBC  6.4 09/14/2017   HGB 10.8 (L) 09/14/2017   HCT  32.2 (L) 09/14/2017   MCV 84.9 09/14/2017   PLT 115.0 (L) 09/14/2017    Lab Results  Component Value Date   CREATININE 1.40 (H) 10/26/2017    Lab Results  Component Value Date   PSA 2.50 02/04/2016   PSA 0.93 07/11/2012    No results found for: TESTOSTERONE  Lab Results  Component Value Date   HGBA1C 8.7 (H) 09/14/2017    Urinalysis > 30 WBC's.  3-10 RBC's.  Moderate bacteria.  See Epic.    I have reviewed the labs.  Pertinent Imaging CLINICAL DATA:  Prostate cancer. History of arthritis and both hands.  EXAM: NUCLEAR MEDICINE WHOLE BODY BONE SCAN  TECHNIQUE: Whole body anterior and posterior images were obtained approximately 3 hours after intravenous injection of radiopharmaceutical.  RADIOPHARMACEUTICALS:  23.1 mCi Technetium-21m MDP IV  COMPARISON:  CT 04/19/2005.  FINDINGS: Bilateral renal function excretion. Increased areas of activity noted over the right shoulder. This is most likely secondary to degenerative disease. Right shoulder can be obtained to further evaluate. Mild increased activity noted over the left ischium. Pelvic series suggested for further evaluation. Areas of increased activity noted about both wrists, right knee, both ankles most likely degenerative.  IMPRESSION: 1. Areas of increased activity noted about the right shoulder, most likely degenerative, rights shoulder series can be obtained for further evaluation.  2. Faint area of increased activity noted over the left ischium. Pelvic series suggested for further evaluation.  3. Areas of increased activity noted about both wrists, right knee, both ankles most likely degenerative.   Electronically Signed   By: Marcello Moores  Register   On: 10/27/2017 07:01  CLINICAL DATA:  History of prostate cancer with rising PSA.  EXAM: CT ABDOMEN AND PELVIS WITHOUT AND WITH CONTRAST  TECHNIQUE: Multidetector CT imaging of the  abdomen and pelvis was performed following the standard protocol before and following the bolus administration of intravenous contrast.  CONTRAST:  26mL OMNIPAQUE IOHEXOL 300 MG/ML  SOLN  COMPARISON:  07/26/2012  FINDINGS: Lower chest: Enlarged lower mediastinal and hilar lymph nodes worrisome for metastatic disease. These nodes were present on the prior CT scan but have enlarged.  12 mm right hilar node on image number 10 previously measured 8 mm.  12 mm paraesophageal node on image number 12 previously measured 6 mm.  Left hilar node on image number 7 measures 12 mm and previously measured 5.5 mm.  Hepatobiliary: No focal hepatic lesions or intrahepatic biliary dilatation. Small calcified granuloma noted in the right hepatic lobe. The gallbladder is surgically absent. No common bile duct dilatation.  Pancreas: Prominent fatty changes but no mass, inflammation or ductal dilatation.  Spleen: Stable splenomegaly.  Stable small low-attenuation lesions.  Adrenals/Urinary Tract: The adrenal glands and kidneys are unremarkable. No renal, ureteral or bladder calculi or mass. There is diffuse wall thickening and enhancement of the bladder suggesting cystitis.  Stomach/Bowel: The stomach, duodenum, small bowel and colon are unremarkable. No acute inflammatory changes, mass lesions or obstructive findings. The terminal ileum is normal. Significant descending and sigmoid colon diverticulosis without findings for acute diverticulitis.  Vascular/Lymphatic: Advanced atherosclerotic calcifications involving the aorta and branch vessels. No aneurysm or dissection. The major venous structures are patent.  Enlarged celiac axis and periportal lymph nodes. Celiac axis node on image number 31 measures 15 mm and previously measured 11 mm. Periportal lymph node on image number 32 measures 18 mm and previously measured 14.5 mm.  No retroperitoneal lymphadenopathy. Small  scattered lymph nodes are stable. No pelvic adenopathy.  Small scattered lymph nodes are stable. No inguinal adenopathy.  Reproductive: Moderate prostate gland enlargement and few small brachytherapy seeds. The seminal vesicles are grossly normal.  Other: No abdominal wall hernia. Bilateral inguinal hernias containing fat and fluid.  Musculoskeletal: No evidence of sclerotic metastatic bone disease.  IMPRESSION: 1. Enlarged infrahilar and lower mediastinal lymph nodes in the chest worrisome for metastatic prostate cancer involvement. 2. Slight enlargement of upper abdominal lymph nodes but no pelvic or retroperitoneal adenopathy. 3. No findings to suggest sclerotic osseous metastatic disease. 4. Marked diffuse symmetric bladder wall thickening and enhancement and surrounding inflammation suggesting cystitis. I suppose this also could be due to radiation change. 5. Advanced atherosclerotic calcifications involving the thoracic and abdominal aorta and branch vessels including the coronary arteries.   Electronically Signed   By: Marijo Sanes M.D.   On: 10/26/2017 14:03 I have independently reviewed the films Assessment & Plan:    1. Metastatic prostate cancer Contrast CT with findings suggestive of metastasis  Bone scan with increased activity in the right shoulder and left ischium - plain films ordered Starting dose of Firmogan given 11/06/2017  Refer to the [facility]   2. BPH with LUTS IPSS score is 30/6, it is worsening Continue conservative management, avoiding bladder irritants and timed voiding's Most bothersome symptoms is/are urge incontinence  Continue alfuzosin 10 mg daily  3. Urge incontinence CATH UA positive for 11-30 WBC's with yeast present - will send for culture - hold on antifungal/antibiotic until cultures available Given Myrbetriq 25 mg daily, # 28 samples given - I have advised the patient of the side effects of Myrbetriq, such as: elevation  in BP, urinary retention and/or HA.  4. Balanitis  Nystatin cream prescribed for twice daily application.    Return in about 1 month (around 12/02/2017) for Firmagon .  Zara Council, PA-C  [facility] [address] [place], [place] [address] [phone]

## 2017-11-01 ENCOUNTER — Encounter: Payer: Self-pay | Admitting: Urology

## 2017-11-01 ENCOUNTER — Ambulatory Visit: Payer: PPO | Admitting: Urology

## 2017-11-01 VITALS — BP 112/61 | HR 88 | Ht 62.0 in | Wt 195.2 lb

## 2017-11-01 DIAGNOSIS — N401 Enlarged prostate with lower urinary tract symptoms: Secondary | ICD-10-CM

## 2017-11-01 DIAGNOSIS — R300 Dysuria: Secondary | ICD-10-CM

## 2017-11-01 DIAGNOSIS — N481 Balanitis: Secondary | ICD-10-CM | POA: Diagnosis not present

## 2017-11-01 DIAGNOSIS — N138 Other obstructive and reflux uropathy: Secondary | ICD-10-CM | POA: Diagnosis not present

## 2017-11-01 DIAGNOSIS — N3941 Urge incontinence: Secondary | ICD-10-CM | POA: Diagnosis not present

## 2017-11-01 DIAGNOSIS — C61 Malignant neoplasm of prostate: Secondary | ICD-10-CM

## 2017-11-01 MED ORDER — NYSTATIN 100000 UNIT/GM EX CREA: 1.0000 "application " | TOPICAL_CREAM | Freq: Two times a day (BID) | CUTANEOUS | 0 refills | Status: DC

## 2017-11-01 NOTE — Patient Instructions (Addendum)
Please start calcium 600 mg twice daily along with vitamin D 200 mg twice daily Mirabegron extended-release tablets What is this medicine? MIRABEGRON (MIR a BEG ron) is used to treat overactive bladder. This medicine reduces the amount of bathroom visits. It may also help to control wetting accidents. This medicine may be used for other purposes; ask your health care provider or pharmacist if you have questions. COMMON BRAND NAME(S): Myrbetriq What should I tell my health care provider before I take this medicine? They need to know if you have any of these conditions: -difficulty passing urine -high blood pressure -kidney disease -liver disease -an unusual or allergic reaction to mirabegron, other medicines, foods, dyes, or preservatives -pregnant or trying to get pregnant -breast-feeding How should I use this medicine? Take this medicine by mouth with a glass of water. Follow the directions on the prescription label. Do not cut, crush or chew this medicine. You can take it with or without food. If it upsets your stomach, take it with food. Take your medicine at regular intervals. Do not take it more often than directed. Do not stop taking except on your doctor's advice. Talk to your pediatrician regarding the use of this medicine in children. Special care may be needed. Overdosage: If you think you have taken too much of this medicine contact a poison control center or emergency room at once. NOTE: This medicine is only for you. Do not share this medicine with others. What if I miss a dose? If you miss a dose, take it as soon as you can. If it is almost time for your next dose, take only that dose. Do not take double or extra doses. What may interact with this medicine? -certain medicines for bladder problems like fesoterodine, oxybutynin, solifenacin, tolterodine -desipramine -digoxin -flecainide -ketoconazole -MAOIs like Carbex, Eldepryl, Marplan, Nardil, and  Parnate -metoprolol -propafenone -thioridazine -warfarin This list may not describe all possible interactions. Give your health care provider a list of all the medicines, herbs, non-prescription drugs, or dietary supplements you use. Also tell them if you smoke, drink alcohol, or use illegal drugs. Some items may interact with your medicine. What should I watch for while using this medicine? It may take 8 weeks to notice the full benefit from this medicine. You may need to limit your intake tea, coffee, caffeinated sodas, and alcohol. These drinks may make your symptoms worse. Visit your doctor or health care professional for regular checks on your progress. Check your blood pressure as directed. Ask your doctor or health care professional what your blood pressure should be and when you should contact him or her. What side effects may I notice from receiving this medicine? Side effects that you should report to your doctor or health care professional as soon as possible: -allergic reactions like skin rash, itching or hives, swelling of the face, lips, or tongue -chest pain or palpitations -severe or sudden headache -high blood pressure -fast, irregular heartbeat -redness, blistering, peeling or loosening of the skin, including inside the mouth -signs of infection like fever or chills; cough; sore throat; pain or difficulty passing urine -trouble passing urine or change in the amount of urine Side effects that usually do not require medical attention (report to your doctor or health care professional if they continue or are bothersome): -constipation -diarrhea -dizziness -dry eyes -joint pain -mild headache -nausea -runny nose This list may not describe all possible side effects. Call your doctor for medical advice about side effects. You may report side effects  to FDA at 1-800-FDA-1088. Where should I keep my medicine? Keep out of the reach of children. Store at room temperature  between 15 and 30 degrees C (59 and 86 degrees F). Throw away any unused medicine after the expiration date. NOTE: This sheet is a summary. It may not cover all possible information. If you have questions about this medicine, talk to your doctor, pharmacist, or health care provider.  2018 Elsevier/Gold Standard (2015-04-30 12:14:30)

## 2017-11-02 LAB — URINALYSIS, COMPLETE
BILIRUBIN UA: NEGATIVE
Ketones, UA: NEGATIVE
NITRITE UA: NEGATIVE
SPEC GRAV UA: 1.02 (ref 1.005–1.030)
Urobilinogen, Ur: 0.2 mg/dL (ref 0.2–1.0)
pH, UA: 5 (ref 5.0–7.5)

## 2017-11-02 LAB — MICROSCOPIC EXAMINATION: Epithelial Cells (non renal): NONE SEEN /hpf (ref 0–10)

## 2017-11-03 ENCOUNTER — Ambulatory Visit
Admission: RE | Admit: 2017-11-03 | Discharge: 2017-11-03 | Disposition: A | Discharge status: Home / Self Care | Payer: PPO | Source: Ambulatory Visit | Attending: Urology | Admitting: Urology

## 2017-11-03 ENCOUNTER — Ambulatory Visit: Payer: PPO | Admitting: Urology

## 2017-11-03 ENCOUNTER — Telehealth: Payer: Self-pay | Admitting: Urology

## 2017-11-03 ENCOUNTER — Ambulatory Visit
Admission: RE | Admit: 2017-11-03 | Discharge: 2017-11-03 | Disposition: A | Discharge status: Home / Self Care | Payer: PPO | Source: Ambulatory Visit | Attending: Nephrology | Admitting: Nephrology

## 2017-11-03 DIAGNOSIS — C61 Malignant neoplasm of prostate: Secondary | ICD-10-CM | POA: Insufficient documentation

## 2017-11-03 DIAGNOSIS — N189 Chronic kidney disease, unspecified: Secondary | ICD-10-CM | POA: Diagnosis not present

## 2017-11-03 DIAGNOSIS — M19011 Primary osteoarthritis, right shoulder: Secondary | ICD-10-CM | POA: Diagnosis not present

## 2017-11-03 DIAGNOSIS — R109 Unspecified abdominal pain: Secondary | ICD-10-CM | POA: Diagnosis not present

## 2017-11-03 DIAGNOSIS — R937 Abnormal findings on diagnostic imaging of other parts of musculoskeletal system: Secondary | ICD-10-CM | POA: Diagnosis not present

## 2017-11-03 DIAGNOSIS — N183 Chronic kidney disease, stage 3 unspecified: Secondary | ICD-10-CM

## 2017-11-03 DIAGNOSIS — N329 Bladder disorder, unspecified: Secondary | ICD-10-CM | POA: Insufficient documentation

## 2017-11-03 IMAGING — CR DG PELVIS 1-2V
1 series · 1 of 1 positions shown · non-contrast
Comparison: [DATE]

CLINICAL DATA: History of prostate carcinoma with hip pain and
increased activity in the region of the ischium on the left.

EXAM:
PELVIS - 1-2 VIEW

[dg pelvis 1-2 views]
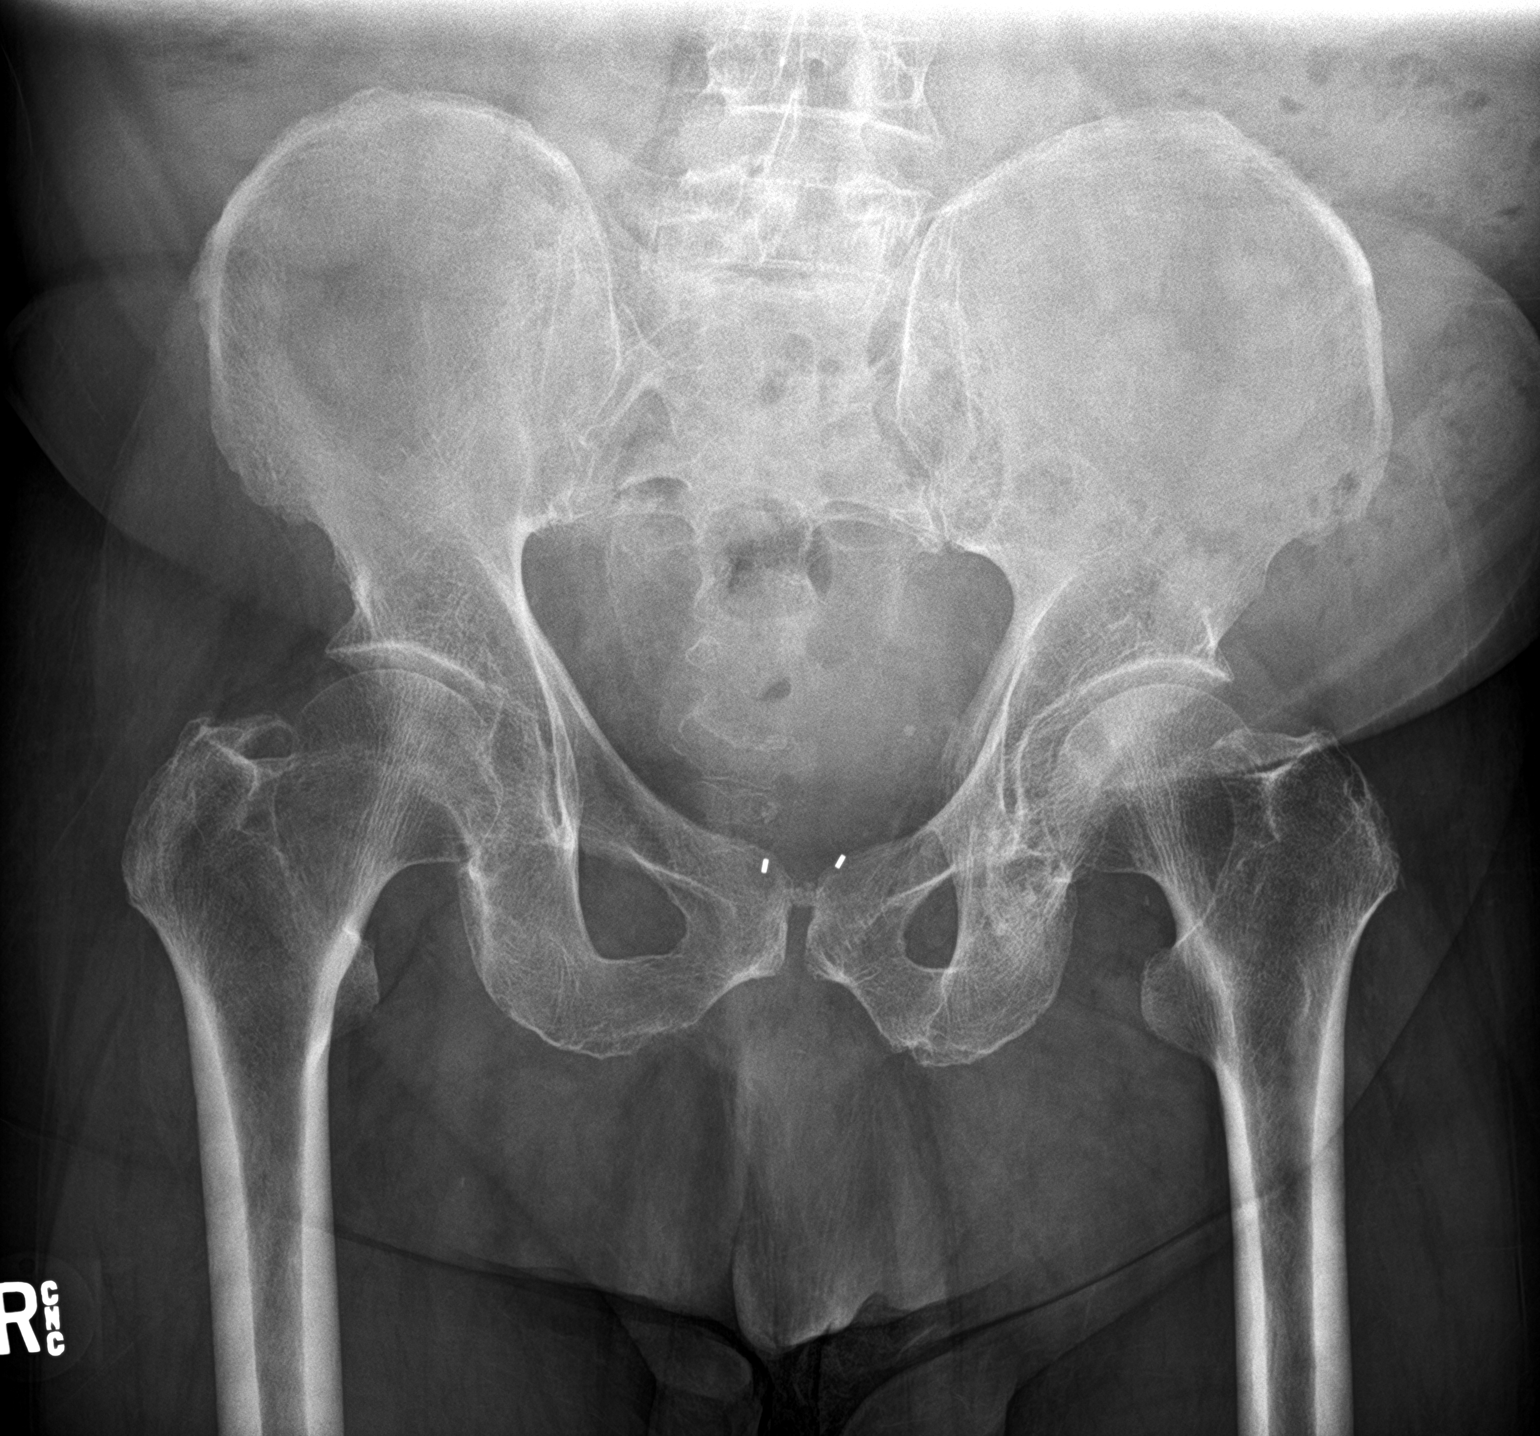

[1 of 1 positions shown; findings below may reference images not displayed]

FINDINGS: Pelvic ring is intact. Mild increased sclerosis is noted in the
region of the left ischium corresponding to that seen on the prior
bone scan. This corresponds to an area of sclerosis and trabecular
irregularity seen on recent CT examination. No other focal
abnormality is noted.
IMPRESSION: Area of trabecular irregularity in the left ischium which
corresponds to the area of abnormality seen on recent bone scan and
CT. Possibility of metastatic disease could not be totally excluded.

## 2017-11-03 IMAGING — CR DG SHOULDER 2+V*R*
1 series · 3 of 3 positions shown · non-contrast
Comparison: None.

CLINICAL DATA: Prostate carcinoma with shoulder pain, initial
encounter

EXAM:
RIGHT SHOULDER - 2+ VIEW

[Series 1: dg shoulder right · 0.14mm/px · 3 of 3 slices shown]
[im 1/3]
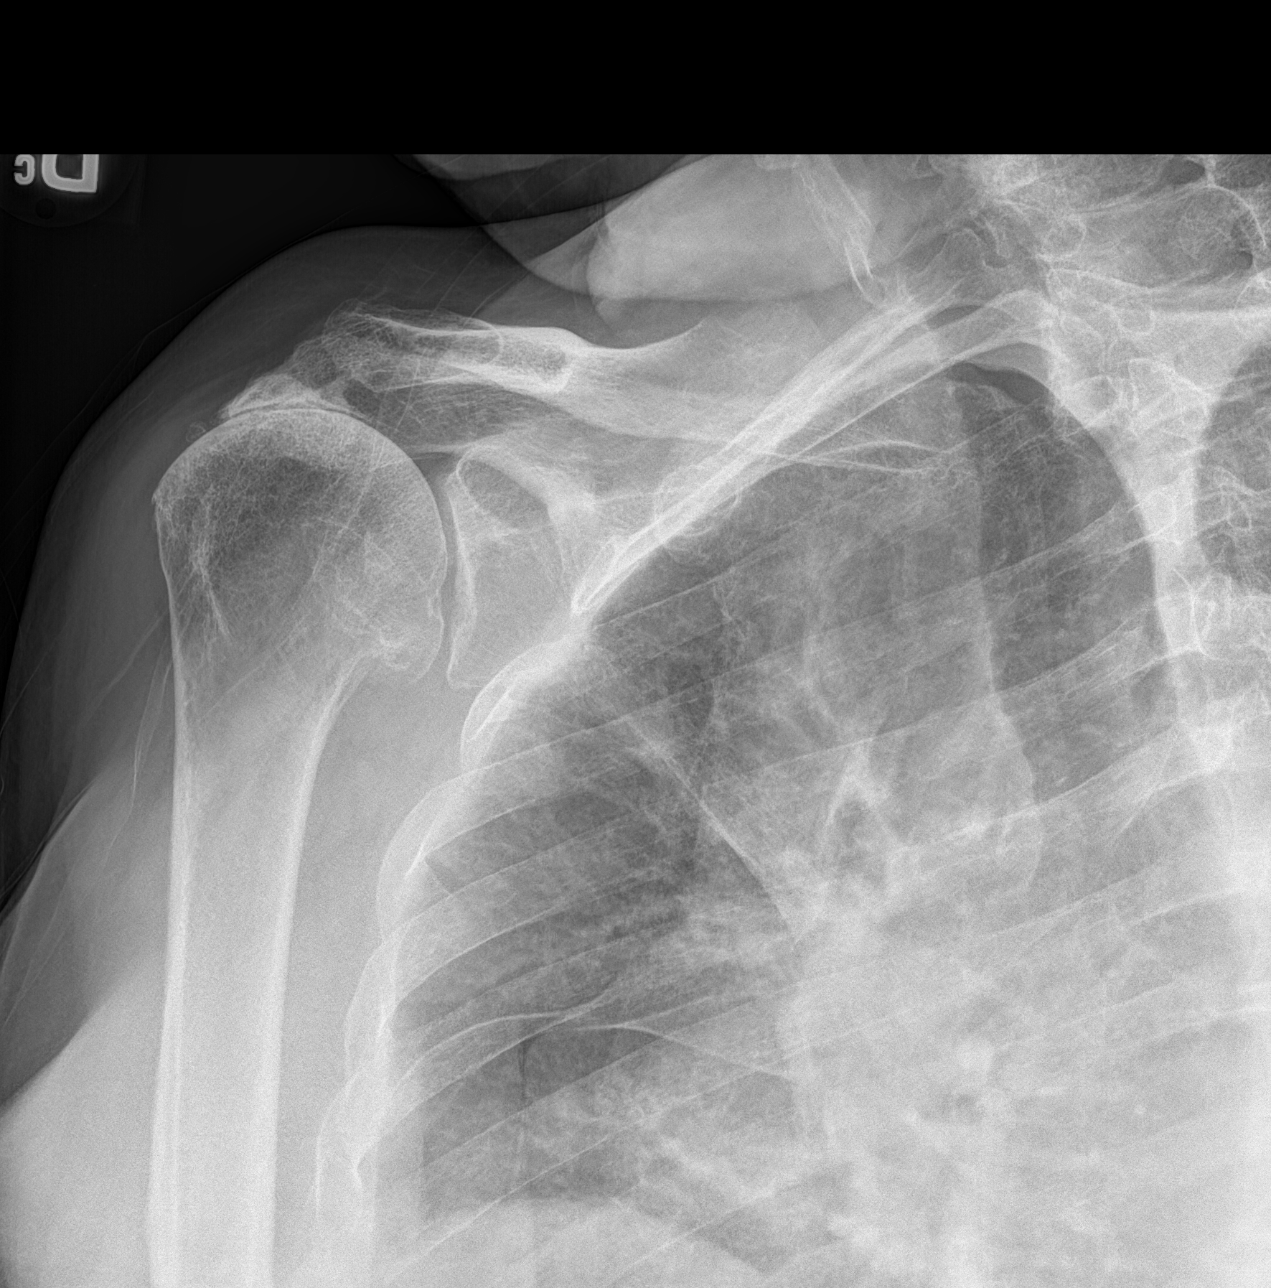
[im 2/3]
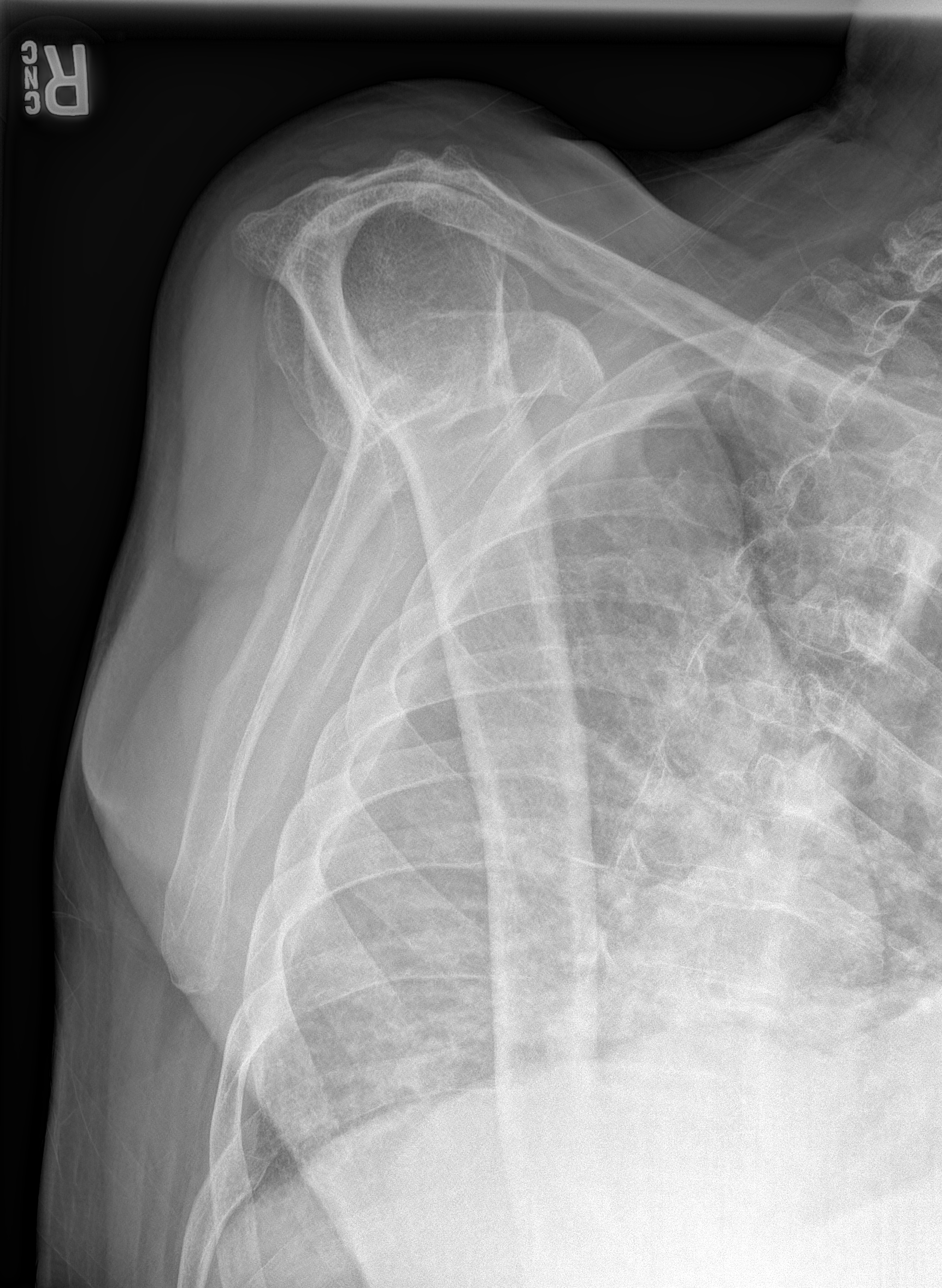
[im 3/3]
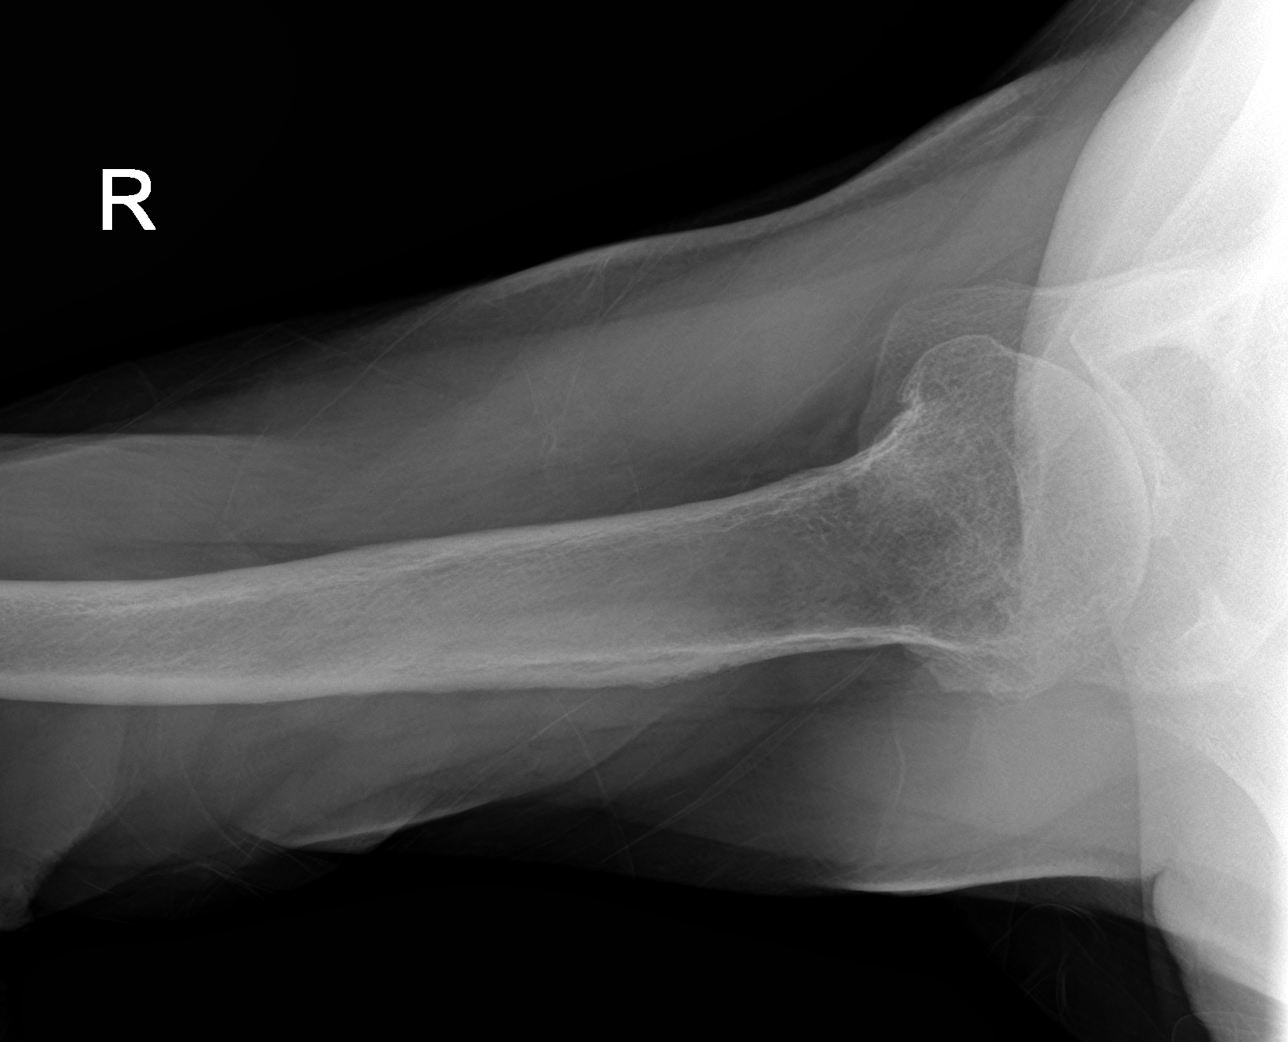

[3 of 3 positions shown; findings below may reference images not displayed]

FINDINGS: Degenerative changes of the right glenohumeral joint are noted.
Remodeling of the acromion is noted secondary to a high-riding
humeral head. These are changes consistent with a prior rotator cuff
injury. No under lying soft tissue abnormality is noted. The areas
of abnormality seen on recent bone scan are felt to be related to
the degenerative changes previously described.
IMPRESSION: Degenerative changes without acute abnormality. Changes consistent
with prior rotator cuff injury.

## 2017-11-03 NOTE — Telephone Encounter (Signed)
I notified [name] that we did not have the [place] available in [place].  Please call her today and tell her what time to bring him on Monday to the [place] office for the injection.

## 2017-11-04 LAB — CULTURE, URINE COMPREHENSIVE

## 2017-11-06 ENCOUNTER — Telehealth: Payer: Self-pay

## 2017-11-06 ENCOUNTER — Ambulatory Visit (INDEPENDENT_AMBULATORY_CARE_PROVIDER_SITE_OTHER): Payer: PPO

## 2017-11-06 DIAGNOSIS — C61 Malignant neoplasm of prostate: Secondary | ICD-10-CM

## 2017-11-06 MED ORDER — DEGARELIX ACETATE 120 MG ~~LOC~~ SOLR
240.0000 mg | Freq: Once | SUBCUTANEOUS | Status: AC
  Administered 2017-11-06: 240 mg via SUBCUTANEOUS

## 2017-11-06 MED ORDER — FLUCONAZOLE 200 MG PO TABS: 200.0000 mg | ORAL_TABLET | Freq: Every day | ORAL | 0 refills | Status: AC

## 2017-11-06 NOTE — Progress Notes (Signed)
Firmagon Sub Q Injection  Due to Prostate Cancer patient is present today for a Firmagon Injection.   Medication: [name] (Degarelix)  Dose: 240mg  Location: Bilateral upper abdomen Lot: [id number] Exp: 03/2020  Patient tolerated well, no complications were noted  Performed by: Cristie Hem, CMA and Fonnie Jarvis, CMA  Follow up: 1 month

## 2017-11-06 NOTE — Telephone Encounter (Signed)
-----   Message from Nori Riis, PA-C sent at 11/04/2017  2:52 PM EDT ----- Please send for further identification and have patient start fluconazole 200 mg once daily for two weeks.

## 2017-11-06 NOTE — Telephone Encounter (Signed)
Pt dtr informed 

## 2017-11-07 ENCOUNTER — Telehealth: Payer: Self-pay | Admitting: Urology

## 2017-11-07 NOTE — Telephone Encounter (Signed)
Pt's daughter, Lovey Newcomer, has a few questions about her Dad and his Dx.  Please give her a call [phone]

## 2017-11-07 NOTE — Telephone Encounter (Signed)
Spoke to daughter and cleared up her questions.

## 2017-11-08 ENCOUNTER — Ambulatory Visit: Payer: PPO

## 2017-11-08 ENCOUNTER — Other Ambulatory Visit: Payer: Self-pay

## 2017-11-08 VITALS — BP 102/56 | HR 92 | Ht 62.0 in | Wt 186.4 lb

## 2017-11-08 DIAGNOSIS — R310 Gross hematuria: Secondary | ICD-10-CM

## 2017-11-08 LAB — URINALYSIS, COMPLETE
Bilirubin, UA: NEGATIVE
Ketones, UA: NEGATIVE
Nitrite, UA: NEGATIVE
PH UA: 5 (ref 5.0–7.5)
Specific Gravity, UA: 1.015 (ref 1.005–1.030)
Urobilinogen, Ur: 0.2 mg/dL (ref 0.2–1.0)

## 2017-11-08 LAB — MICROSCOPIC EXAMINATION
Epithelial Cells (non renal): NONE SEEN /hpf (ref 0–10)
RBC, UA: >30 /hpf — ABNORMAL HIGH (ref 0–2)
WBC, UA: >30 /hpf — ABNORMAL HIGH (ref 0–5)

## 2017-11-08 MED ORDER — NITROFURANTOIN MONOHYD MACRO 100 MG PO CAPS: 100.0000 mg | ORAL_CAPSULE | Freq: Two times a day (BID) | ORAL | 0 refills | Status: AC

## 2017-11-08 NOTE — Progress Notes (Signed)
Pt is present in office today for nurse visit. Pt states he has been experiencing gross hematuria. Urine was collected for analysis and culture. Urine suspicious for infection, pt started on Macrobid, bid, 7 days.

## 2017-11-09 DIAGNOSIS — Z79899 Other long term (current) drug therapy: Secondary | ICD-10-CM | POA: Diagnosis not present

## 2017-11-09 DIAGNOSIS — Z5181 Encounter for therapeutic drug level monitoring: Secondary | ICD-10-CM | POA: Diagnosis not present

## 2017-11-09 DIAGNOSIS — R319 Hematuria, unspecified: Secondary | ICD-10-CM | POA: Diagnosis not present

## 2017-11-09 DIAGNOSIS — R1031 Right lower quadrant pain: Secondary | ICD-10-CM | POA: Diagnosis not present

## 2017-11-09 DIAGNOSIS — Z8546 Personal history of malignant neoplasm of prostate: Secondary | ICD-10-CM | POA: Diagnosis not present

## 2017-11-09 DIAGNOSIS — Z87891 Personal history of nicotine dependence: Secondary | ICD-10-CM | POA: Diagnosis not present

## 2017-11-09 DIAGNOSIS — R161 Splenomegaly, not elsewhere classified: Secondary | ICD-10-CM | POA: Diagnosis not present

## 2017-11-09 DIAGNOSIS — R591 Generalized enlarged lymph nodes: Secondary | ICD-10-CM | POA: Diagnosis not present

## 2017-11-09 DIAGNOSIS — N390 Urinary tract infection, site not specified: Secondary | ICD-10-CM | POA: Diagnosis not present

## 2017-11-09 DIAGNOSIS — R310 Gross hematuria: Secondary | ICD-10-CM | POA: Diagnosis not present

## 2017-11-10 ENCOUNTER — Other Ambulatory Visit: Payer: Self-pay | Admitting: Nephrology

## 2017-11-10 ENCOUNTER — Telehealth: Payer: Self-pay

## 2017-11-10 DIAGNOSIS — R310 Gross hematuria: Secondary | ICD-10-CM | POA: Diagnosis not present

## 2017-11-10 DIAGNOSIS — R1031 Right lower quadrant pain: Secondary | ICD-10-CM | POA: Diagnosis not present

## 2017-11-10 DIAGNOSIS — N183 Chronic kidney disease, stage 3 unspecified: Secondary | ICD-10-CM

## 2017-11-10 DIAGNOSIS — R319 Hematuria, unspecified: Secondary | ICD-10-CM | POA: Diagnosis not present

## 2017-11-10 DIAGNOSIS — R161 Splenomegaly, not elsewhere classified: Secondary | ICD-10-CM | POA: Diagnosis not present

## 2017-11-10 NOTE — Telephone Encounter (Signed)
Pt was taken to [facility] emergency room last night due to severe gross hematuria, they drained his bladder at the emergency room and had a catheter placed. He was sent home and told to follow-up with his urologist. Pt scheduled for follow-up 11/13/17.

## 2017-11-10 NOTE — Telephone Encounter (Signed)
-----   Message from Nori Riis, PA-C sent at 11/10/2017  7:56 AM EDT ----- Please give a courtesy call to Mr. Volk to see how he is doing.  His preliminary report for his urine culture is negative, but I still want him to stay on the antibiotic and the antifungal until the final results are in.

## 2017-11-10 NOTE — Progress Notes (Signed)
11/13/2017 2:06 PM   Matthew Miles. Sep 01, 1928 [id number]  Referring provider: [name], MD [address] [place], [place] [address]  Chief Complaint  Patient presents with  . Hematuria    follow up    HPI: Patient is an 84 year old Caucasian male with a history of prostate cancer and a rising PSA who presents today for follow-up after being seen at [facility] ED for gross hematuria.    He was taken to [facility] ED on 11/09/2017 for gross hematuria.  CBI was attempting, but a three way catheter could not be placed.  He then had a Foley placed.  CTU performed noted no new findings or findings that would explain his hematuria.  Hgb was 11.8.  HCT was 38.6%.  He had 600 cc on bladder scan.    Today, Foley in place with clear yellow urine in the leg bag.  He states the catheter is uncomfortable.  Patient denies any gross hematuria, dysuria or suprapubic/flank pain.  Patient denies any fevers, chills, nausea or vomiting.   1) PCa - Pt with a history of prostate cancer and rising PSA after XRT. He had radiation done in "early 1990s or 2000's". He saw Dr. Yves Miles. His PSA was 0.93 Apr 2014 and had risen to 2.14 Jan 2016 -- PSADT = 2.4 years. An Apr 2014 CT A/P was done which showed no mets, gold seeds in prostate. PSA slightly rose to 2.7 in August 2018.   PSA now at 3.6.  Contrast CT 10/2017 noted an enlarged infrahilar and lower mediastinal lymph nodes in the chest worrisome for metastatic prostate cancer involvement.  Slight enlargement of upper abdominal lymph nodes but no pelvic or retroperitoneal adenopathy.  No findings to suggest sclerotic osseous metastatic disease.  Marked diffuse symmetric bladder wall thickening and enhancement and surrounding inflammation suggesting cystitis. I suppose this also could be due to radiation change.  Advanced atherosclerotic calcifications involving the thoracic and abdominal aorta and branch vessels including the coronary arteries.  Bone scan in   10/2017 noted areas of increased activity noted about the right shoulder, most likely degenerative, rights shoulder series can be obtained for further evaluation.  Faint area of increased activity noted over the left ischium.  Pelvic series suggested for further evaluation.  Areas of increased activity noted about both wrists, right knee, both ankles most likely degenerative.  Shoulder series noted degenerative changes without acute abnormality. Changes consistent with prior rotator cuff injury.  Pelvic series noted area of trabecular irregularity in the left ischium which corresponds to the area of abnormality seen on recent bone scan and CT. Possibility of metastatic disease could not be totally excluded.  [name] was initiated on 11/06/2017.  PSA Trend  0.93 in 07/2012  2.3 in 11/2015  2.50 in 01/2016  2.3 in 05/2016  2.7 in 11/2016  3.1 in 05/2017  3.6 in 09/2017  PMH: Past Medical History:  Diagnosis Date  . Allergy   . Arthritis   . CHF (congestive heart failure) ([place])   . Chicken pox   . Cholecystitis   . Colon polyps   . COPD (chronic obstructive pulmonary disease) ([place])   . Coronary artery disease   . Diabetes mellitus without complication ([place])   . Emphysema of lung ([place])   . GERD (gastroesophageal reflux disease)   . Heart murmur   . Hematemesis/vomiting blood 04/10/11  . Hypercholesterolemia   . Mild hypertension   . Pancreatitis   . Prostate cancer ([place])   .  Prostate cancer [facility])    radiation therapy   . Smoker   . Ulcer   . Upper GI bleed 1980    Surgical History: Past Surgical History:  Procedure Laterality Date  . APPENDECTOMY    . CARDIAC CATHETERIZATION  May 2002 and Feb 2013   [facility]; no stents   . CATARACT EXTRACTION    . CHOLECYSTECTOMY    . CIRCUMCISION    . COLONOSCOPY    . HEMORRHOID SURGERY    . SP CHOLECYSTOMY    . STOMACH SURGERY     bleeding ulcers, followed by Dr. [name]    Home Medications:  Allergies as of 11/13/2017       Reactions   Sulfa Antibiotics    GI upset   Tradjenta [linagliptin] Other (See Comments)   Hair loss      Medication List        Accurate as of 11/13/17  2:06 PM. Always use your most recent med list.          alfuzosin 10 MG 24 hr tablet Commonly known as:  UROXATRAL Take 10 mg by mouth daily with breakfast.   aspirin 81 MG tablet Take 81 mg by mouth daily.   B-12 1000 MCG Tbcr Take 1 tablet by mouth daily.   carvedilol 6.25 MG tablet Commonly known as:  COREG Take 1 tablet (6.25 mg total) by mouth 2 (two) times daily.   empagliflozin 10 MG Tabs tablet Commonly known as:  JARDIANCE Take 10 mg by mouth daily.   fluconazole 200 MG tablet Commonly known as:  DIFLUCAN Take 1 tablet (200 mg total) by mouth daily for 14 days.   glipiZIDE 10 MG 24 hr tablet Commonly known as:  GLUCOTROL XL TAKE 1 TABLET(10 MG) BY MOUTH TWICE DAILY   JANUVIA 50 MG tablet Generic drug:  sitaGLIPtin   metFORMIN 500 MG 24 hr tablet Commonly known as:  GLUCOPHAGE-XR TAKE 2 TABLETS(1000 MG) BY MOUTH TWICE DAILY   nitrofurantoin (macrocrystal-monohydrate) 100 MG capsule Commonly known as:  MACROBID Take 1 capsule (100 mg total) by mouth 2 (two) times daily for 7 days.   nitroGLYCERIN 0.4 MG SL tablet Commonly known as:  NITROSTAT Place 1 tablet (0.4 mg total) under the tongue every 5 (five) minutes as needed.   nystatin cream Commonly known as:  MYCOSTATIN Apply 1 application topically 2 (two) times daily.   ONETOUCH VERIO test strip Generic drug:  glucose blood USE TO TEST BLOOD SUGAR TWICE DAILY   pantoprazole 20 MG tablet Commonly known as:  PROTONIX TAKE 1 TABLET(20 MG) BY MOUTH DAILY   potassium chloride SA 20 MEQ tablet Commonly known as:  K-DUR,KLOR-CON TAKE 1 TABLET(20 MEQ) BY MOUTH DAILY   pravastatin 40 MG tablet Commonly known as:  PRAVACHOL TAKE 1 TABLET(40 MG) BY MOUTH DAILY   PROAIR HFA 108 (90 Base) MCG/ACT inhaler Generic drug:  albuterol INHALE 2 PUFFS  INTO THE LUNGS EVERY 6 HOURS AS NEEDED FOR WHEEZING OR SHORTNESS OF BREATH   SPIRIVA RESPIMAT 2.5 MCG/ACT Aers Generic drug:  Tiotropium Bromide Monohydrate INHALE 2 PUFFS INTO THE LUNGS DAILY   torsemide 10 MG tablet Commonly known as:  DEMADEX Take 1 tablet (10 mg total) by mouth 2 (two) times daily as needed.       Allergies:  Allergies  Allergen Reactions  . Sulfa Antibiotics     GI upset  . Tradjenta [Linagliptin] Other (See Comments)    Hair loss    Family History: Family History  Problem Relation Age of Onset  . Prostate cancer Father   . Liver cancer Brother   . Prostate cancer Brother   . Emphysema Sister        smoker    Social History:  reports that he quit smoking about 6 years ago. His smoking use included cigarettes. He has a 65.00 pack-year smoking history. He has quit using smokeless tobacco. His smokeless tobacco use included chew. He reports that he drinks alcohol. He reports that he does not use drugs.  ROS: UROLOGY Frequent Urination?: No Hard to postpone urination?: No Burning/pain with urination?: No Get up at night to urinate?: No Leakage of urine?: No Urine stream starts and stops?: No Trouble starting stream?: No Do you have to strain to urinate?: No Blood in urine?: No Urinary tract infection?: No Sexually transmitted disease?: No Injury to kidneys or bladder?: No Painful intercourse?: No Weak stream?: No Erection problems?: No Penile pain?: No  Gastrointestinal Nausea?: No Vomiting?: No Indigestion/heartburn?: No Diarrhea?: No Constipation?: No  Constitutional Fever: No Night sweats?: No Weight loss?: No Fatigue?: No  Skin Skin rash/lesions?: No Itching?: No  Eyes Blurred vision?: No Double vision?: No  Ears/Nose/Throat Sore throat?: No Sinus problems?: No  Hematologic/Lymphatic Swollen glands?: No Easy bruising?: Yes  Cardiovascular Leg swelling?: No Chest pain?: No  Respiratory Cough?: No Shortness of  breath?: No  Endocrine Excessive thirst?: No  Musculoskeletal Back pain?: No Joint pain?: No  Neurological Headaches?: No Dizziness?: No  Psychologic Depression?: No Anxiety?: No  Physical Exam: BP (!) 122/50   Pulse 83   Wt 185 lb (83.9 kg)   BMI 33.84 kg/m   Constitutional: Well nourished. Alert and oriented, No acute distress. HEENT: [place] AT, moist mucus membranes. Trachea midline, no masses. Cardiovascular: No clubbing, cyanosis, or edema. Respiratory: Normal respiratory effort, no increased work of breathing. Skin: No rashes, bruises or suspicious lesions. Lymph: No cervical or inguinal adenopathy. Neurologic: Grossly intact, no focal deficits, moving all 4 extremities. Psychiatric: Normal mood and affect.  Laboratory Data: Lab Results  Component Value Date   WBC 6.4 09/14/2017   HGB 10.8 (L) 09/14/2017   HCT 32.2 (L) 09/14/2017   MCV 84.9 09/14/2017   PLT 115.0 (L) 09/14/2017    Lab Results  Component Value Date   CREATININE 1.40 (H) 10/26/2017    Lab Results  Component Value Date   PSA 2.50 02/04/2016   PSA 0.93 07/11/2012    No results found for: TESTOSTERONE  Lab Results  Component Value Date   HGBA1C 8.7 (H) 09/14/2017    I have reviewed the labs.  Pertinent Imaging CTU on 11/09/2017 at [place] - no findings to explain hematuria  Assessment & Plan:    1. Urinary rentention Foley in place  Discontinue Foley on Thursday am for TOV  2. Gross hematuria CTU completed on 11/09/2017 - no findings to explain hematuria  Urine culture is negative. Yeast urine culture is still pending Will need a cystoscopy in the future  3. Metastatic prostate cancer Starting dose of Firmagon given 11/06/2017  Refer to the [place]   4. BPH with LUTS Continue alfuzosin 10 mg daily  5. Urge incontinence Patient to not take the Myrbetriq samples at this time due to the episode of retention  6. Balanitis  Nystatin cream prescribed for twice daily  application.    Return for Thursday am for Foley removal .  Zara Council, PA-C  [facility] [address] [place], [place] [address] [phone]  

## 2017-11-11 LAB — CULTURE, URINE COMPREHENSIVE

## 2017-11-12 ENCOUNTER — Other Ambulatory Visit: Payer: Self-pay | Admitting: Family Medicine

## 2017-11-13 ENCOUNTER — Ambulatory Visit (INDEPENDENT_AMBULATORY_CARE_PROVIDER_SITE_OTHER): Payer: PPO | Admitting: Urology

## 2017-11-13 ENCOUNTER — Encounter: Payer: Self-pay | Admitting: Urology

## 2017-11-13 VITALS — BP 122/50 | HR 83 | Wt 185.0 lb

## 2017-11-13 DIAGNOSIS — R310 Gross hematuria: Secondary | ICD-10-CM

## 2017-11-13 DIAGNOSIS — C61 Malignant neoplasm of prostate: Secondary | ICD-10-CM | POA: Diagnosis not present

## 2017-11-13 DIAGNOSIS — R338 Other retention of urine: Secondary | ICD-10-CM | POA: Diagnosis not present

## 2017-11-14 ENCOUNTER — Telehealth: Payer: Self-pay

## 2017-11-14 LAB — BUN+CREAT
BUN/Creatinine Ratio: 14 (ref 10–24)
BUN: 24 mg/dL (ref 8–27)
Creatinine, Ser: 1.73 mg/dL — ABNORMAL HIGH (ref 0.76–1.27)
GFR calc Af Amer: 40 mL/min/{1.73_m2} — ABNORMAL LOW (ref >59)
GFR calc non Af Amer: 35 mL/min/{1.73_m2} — ABNORMAL LOW (ref >59)

## 2017-11-14 LAB — HEMATOCRIT: Hematocrit: 35.4 % — ABNORMAL LOW (ref 37.5–51.0)

## 2017-11-14 LAB — HEMOGLOBIN: Hemoglobin: 11 g/dL — ABNORMAL LOW (ref 13.0–17.7)

## 2017-11-14 NOTE — Telephone Encounter (Signed)
-----   Message from Nori Riis, PA-C sent at 11/14/2017  8:31 AM EDT ----- Please let his daughter know that his Hgb and HCT are stable.  His creatinine is up a bit, but this is likely due to being exposed to contrast.  We will recheck it again in a couple weeks.

## 2017-11-14 NOTE — Telephone Encounter (Signed)
Pt daughter informed

## 2017-11-16 ENCOUNTER — Ambulatory Visit (INDEPENDENT_AMBULATORY_CARE_PROVIDER_SITE_OTHER): Payer: PPO

## 2017-11-16 ENCOUNTER — Other Ambulatory Visit: Payer: Self-pay

## 2017-11-16 DIAGNOSIS — R310 Gross hematuria: Secondary | ICD-10-CM

## 2017-11-16 NOTE — Progress Notes (Signed)
Pt present today for foley catheter removal    Catheter Removal  Patient is present today for a catheter removal.  44ml of water was drained from the balloon. A 16FR foley cath was removed from the bladder no complications were noted . Patient tolerated well.  Preformed by: Cristie Hem, CMA  Follow up/ Additional notes: Return at 1400 for bladder scan  Pt returned this afternoon for bladder scan after trial void.   Bladder Scan Patient can void: 200 ml Performed By: Cristie Hem, CMA

## 2017-11-23 ENCOUNTER — Other Ambulatory Visit: Payer: Self-pay | Admitting: Family Medicine

## 2017-11-23 MED ORDER — ALFUZOSIN HCL ER 10 MG PO TB24: 10.0000 mg | ORAL_TABLET | Freq: Every day | ORAL | 11 refills | Status: DC

## 2017-11-24 ENCOUNTER — Ambulatory Visit
Admission: RE | Admit: 2017-11-24 | Discharge: 2017-11-24 | Disposition: A | Discharge status: Home / Self Care | Payer: PPO | Source: Ambulatory Visit | Attending: Nephrology | Admitting: Nephrology

## 2017-11-24 DIAGNOSIS — N183 Chronic kidney disease, stage 3 unspecified: Secondary | ICD-10-CM

## 2017-11-26 ENCOUNTER — Other Ambulatory Visit: Payer: Self-pay | Admitting: Family Medicine

## 2017-11-26 ENCOUNTER — Other Ambulatory Visit: Payer: Self-pay | Admitting: Cardiovascular Disease

## 2017-12-04 ENCOUNTER — Other Ambulatory Visit: Payer: Self-pay

## 2017-12-04 ENCOUNTER — Other Ambulatory Visit: Payer: PPO

## 2017-12-04 DIAGNOSIS — C61 Malignant neoplasm of prostate: Secondary | ICD-10-CM | POA: Diagnosis not present

## 2017-12-05 LAB — TESTOSTERONE: Testosterone: <3 ng/dL — ABNORMAL LOW (ref 264–916)

## 2017-12-05 LAB — PSA: Prostate Specific Ag, Serum: 0.5 ng/mL (ref 0.0–4.0)

## 2017-12-07 ENCOUNTER — Ambulatory Visit: Payer: PPO

## 2017-12-10 ENCOUNTER — Other Ambulatory Visit: Payer: Self-pay | Admitting: Family Medicine

## 2017-12-12 ENCOUNTER — Other Ambulatory Visit: Payer: Self-pay

## 2017-12-12 ENCOUNTER — Encounter: Payer: Self-pay | Admitting: Urology

## 2017-12-12 ENCOUNTER — Ambulatory Visit (INDEPENDENT_AMBULATORY_CARE_PROVIDER_SITE_OTHER): Payer: PPO | Admitting: Urology

## 2017-12-12 VITALS — BP 104/53 | HR 97 | Wt 189.3 lb

## 2017-12-12 DIAGNOSIS — R310 Gross hematuria: Secondary | ICD-10-CM | POA: Diagnosis not present

## 2017-12-12 MED ORDER — LEUPROLIDE ACETATE (6 MONTH) 45 MG IM KIT
45.0000 mg | PACK | Freq: Once | INTRAMUSCULAR | Status: AC
  Administered 2017-12-12: 45 mg via INTRAMUSCULAR

## 2017-12-12 NOTE — Progress Notes (Addendum)
12/12/2017 3:54 PM   Matthew Miles. 09/26/1928 [id number]  Referring provider: [name], MD [address] [place],  [address]  Chief Complaint  Patient presents with  . Hematuria    HPI: Patient is an 84 year old Caucasian male with a history of prostate cancer and a rising PSA who presents today for follow-up after.      He was taken to [facility] ED on 11/09/2017 for gross hematuria.  CBI was attempting, but a three way catheter could not be placed.  He then had a Foley placed.  CTU performed noted no new findings or findings that would explain his hematuria.  Hgb was 11.8.  HCT was 38.6%.  He had 600 cc on bladder scan.    He had his Foley removed for a TOV on 08/08/02019.  He is experiencing frequency.  Patient denies any gross hematuria, dysuria or suprapubic/flank pain.  Patient denies any fevers, chills, nausea or vomiting.   1) PCa - Pt with a history of prostate cancer and rising PSA after XRT. He had radiation done in "early 1990s or 2000's". He saw Dr. [name]. His PSA was 0.93 Apr 2014 and had risen to 2.14 Jan 2016 -- PSADT = 2.4 years. An Apr 2014 CT A/P was done which showed no mets, gold seeds in prostate. PSA slightly rose to 2.7 in August 2018.   PSA now Miles 3.6.  Contrast CT 10/2017 noted an enlarged infrahilar and lower mediastinal lymph nodes in the chest worrisome for metastatic prostate cancer involvement.  Slight enlargement of upper abdominal lymph nodes but no pelvic or retroperitoneal adenopathy.  No findings to suggest sclerotic osseous metastatic disease.  Marked diffuse symmetric bladder wall thickening and enhancement and surrounding inflammation suggesting cystitis. I suppose this also could be due to radiation change.  Advanced atherosclerotic calcifications involving the thoracic and abdominal aorta and branch vessels including the coronary arteries.  Bone scan in  10/2017 noted areas of increased activity noted about the right shoulder,  most likely degenerative, rights shoulder series can be obtained for further evaluation.  Faint area of increased activity noted over the left ischium.  Pelvic series suggested for further evaluation.  Areas of increased activity noted about both wrists, right knee, both ankles most likely degenerative.  Shoulder series noted degenerative changes without acute abnormality. Changes consistent with prior rotator cuff injury.  Pelvic series noted area of trabecular irregularity in the left ischium which corresponds to the area of abnormality seen on recent bone scan and CT. Possibility of metastatic disease could not be totally excluded.  [name] was initiated on 11/06/2017.  PSA Trend  0.93 in 07/2012  2.3 in 11/2015  2.50 in 01/2016  2.3 in 05/2016  2.7 in 11/2016  3.1 in 05/2017  3.6 in 09/2017  0.5 in 11/2017   Testosterone < 3   PMH: Past Medical History:  Diagnosis Date  . Allergy   . Arthritis   . CHF (congestive heart failure) ([place])   . Chicken pox   . Cholecystitis   . Colon polyps   . COPD (chronic obstructive pulmonary disease) ([place])   . Coronary artery disease   . Diabetes mellitus without complication ([place])   . Emphysema of lung ([place])   . GERD (gastroesophageal reflux disease)   . Heart murmur   . Hematemesis/vomiting blood 04/10/11  . Hypercholesterolemia   . Mild hypertension   . Pancreatitis   . Prostate cancer ([place])   . Prostate cancer ([place])  radiation therapy   . Smoker   . Ulcer   . Upper GI bleed 1980    Surgical History: Past Surgical History:  Procedure Laterality Date  . APPENDECTOMY    . CARDIAC CATHETERIZATION  May 2002 and Feb 2013   [facility]; no stents   . CATARACT EXTRACTION    . CHOLECYSTECTOMY    . CIRCUMCISION    . COLONOSCOPY    . HEMORRHOID SURGERY    . SP CHOLECYSTOMY    . STOMACH SURGERY     bleeding ulcers, followed by Dr. [name]    Home Medications:  Allergies as of 12/12/2017      Reactions   Sulfa Antibiotics    GI upset    Tradjenta [linagliptin] Other (See Comments)   Hair loss      Medication List        Accurate as of 12/12/17  3:54 PM. Always use your most recent med list.          alfuzosin 10 MG 24 hr tablet Commonly known as:  UROXATRAL Take 1 tablet (10 mg total) by mouth daily with breakfast.   aspirin 81 MG tablet Take 81 mg by mouth daily.   B-12 1000 MCG Tbcr Take 1 tablet by mouth daily.   carvedilol 6.25 MG tablet Commonly known as:  COREG TAKE 1 TABLET(6.25 MG) BY MOUTH TWICE DAILY   glipiZIDE 10 MG 24 hr tablet Commonly known as:  GLUCOTROL XL TAKE 1 TABLET(10 MG) BY MOUTH TWICE DAILY   JANUVIA 50 MG tablet Generic drug:  sitaGLIPtin   JARDIANCE 10 MG Tabs tablet Generic drug:  empagliflozin TAKE 1 TABLET BY MOUTH DAILY   metFORMIN 500 MG 24 hr tablet Commonly known as:  GLUCOPHAGE-XR TAKE 2 TABLETS(1000 MG) BY MOUTH TWICE DAILY   nitroGLYCERIN 0.4 MG SL tablet Commonly known as:  NITROSTAT Place 1 tablet (0.4 mg total) under the tongue every 5 (five) minutes as needed.   nystatin cream Commonly known as:  MYCOSTATIN Apply 1 application topically 2 (two) times daily.   ONETOUCH VERIO test strip Generic drug:  glucose blood USE TO TEST BLOOD SUGAR TWICE DAILY   pantoprazole 20 MG tablet Commonly known as:  PROTONIX TAKE 1 TABLET(20 MG) BY MOUTH DAILY   potassium chloride SA 20 MEQ tablet Commonly known as:  K-DUR,KLOR-CON TAKE 1 TABLET(20 MEQ) BY MOUTH DAILY   pravastatin 40 MG tablet Commonly known as:  PRAVACHOL TAKE 1 TABLET(40 MG) BY MOUTH DAILY   PROAIR HFA 108 (90 Base) MCG/ACT inhaler Generic drug:  albuterol INHALE 2 PUFFS INTO THE LUNGS EVERY 6 HOURS AS NEEDED FOR WHEEZING OR SHORTNESS OF BREATH   SPIRIVA RESPIMAT 2.5 MCG/ACT Aers Generic drug:  Tiotropium Bromide Monohydrate INHALE 2 PUFFS INTO THE LUNGS DAILY   torsemide 10 MG tablet Commonly known as:  DEMADEX Take 1 tablet (10 mg total) by mouth 2 (two) times daily as needed.        Allergies:  Allergies  Allergen Reactions  . Sulfa Antibiotics     GI upset  . Tradjenta [Linagliptin] Other (See Comments)    Hair loss    Family History: Family History  Problem Relation Age of Onset  . Prostate cancer Father   . Liver cancer Brother   . Prostate cancer Brother   . Emphysema Sister        smoker    Social History:  reports that he quit smoking about 6 years ago. His smoking use included cigarettes. He has a  65.00 pack-year smoking history. He has quit using smokeless tobacco.  His smokeless tobacco use included chew. He reports that he drinks alcohol. He reports that he does not use drugs.  ROS: UROLOGY Frequent Urination?: Yes Hard to postpone urination?: Yes Burning/pain with urination?: Yes Get up Miles night to urinate?: Yes Leakage of urine?: Yes Urine stream starts and stops?: Yes Trouble starting stream?: Yes Do you have to strain to urinate?: Yes Blood in urine?: No Urinary tract infection?: No Sexually transmitted disease?: No Injury to kidneys or bladder?: No Painful intercourse?: No Weak stream?: Yes Erection problems?: No Penile pain?: No  Gastrointestinal Nausea?: Yes Vomiting?: Yes Indigestion/heartburn?: Yes Diarrhea?: Yes Constipation?: Yes  Constitutional Fever: Yes Night sweats?: No Weight loss?: No Fatigue?: No  Skin Skin rash/lesions?: No Itching?: No  Eyes Blurred vision?: No Double vision?: No  Ears/Nose/Throat Sore throat?: No Sinus problems?: No  Hematologic/Lymphatic Swollen glands?: No Easy bruising?: No  Cardiovascular Leg swelling?: No Chest pain?: No  Respiratory Cough?: No Shortness of breath?: No  Endocrine Excessive thirst?: No  Musculoskeletal Back pain?: No Joint pain?: No  Neurological Headaches?: No Dizziness?: No  Psychologic Depression?: No Anxiety?: No  Physical Exam: BP (!) 104/53   Pulse 97   Wt 189 lb 4.8 oz (85.9 kg)   BMI 34.62 kg/m   Constitutional:  Well nourished. Alert and oriented, No acute distress. HEENT: Matthew Miles, moist mucus membranes. Trachea midline, no masses. Cardiovascular: No clubbing, cyanosis, or edema. Respiratory: Normal respiratory effort, no increased work of breathing. Skin: No rashes, bruises or suspicious lesions. Lymph: No cervical or inguinal adenopathy. Neurologic: Grossly intact, no focal deficits, moving all 4 extremities. Psychiatric: Normal mood and affect.  Laboratory Data: Lab Results  Component Value Date   WBC 6.4 09/14/2017   HGB 11.0 (L) 11/13/2017   HCT 35.4 (L) 11/13/2017   MCV 84.9 09/14/2017   PLT 115.0 (L) 09/14/2017    Lab Results  Component Value Date   CREATININE 1.73 (H) 11/13/2017    Lab Results  Component Value Date   PSA 2.50 02/04/2016   PSA 0.93 07/11/2012    Lab Results  Component Value Date   TESTOSTERONE <3 (L) 12/04/2017    Lab Results  Component Value Date   HGBA1C 8.7 (H) 09/14/2017    I have reviewed the labs.  Pertinent Imaging CTU on 11/09/2017 [facility] [place] - no findings to explain hematuria  Assessment & Plan:    1. Urinary rentention Resolved   2. Gross hematuria CTU completed on 11/09/2017 - no findings to explain hematuria  Urine culture is negative.  Schedule cystoscopy to complete hematuria work up   3. Metastatic prostate cancer Starting dose of Firmagon given 11/06/2017  6 months Lupron given today Refer to the [place] - patient has appointment [facility] RTC in 6 months for Lupron injection and PSA   4. BPH with LUTS Continue alfuzosin 10 mg daily  5. Elevated creatinine rechecked today   Return for cystoscopy for hematuria .  Zara Council, PA-C  [facility] [address] [place], [place] [address] [phone]

## 2017-12-13 DIAGNOSIS — R972 Elevated prostate specific antigen [PSA]: Secondary | ICD-10-CM | POA: Diagnosis not present

## 2017-12-13 DIAGNOSIS — Z79818 Long term (current) use of other agents affecting estrogen receptors and estrogen levels: Secondary | ICD-10-CM | POA: Diagnosis not present

## 2017-12-13 DIAGNOSIS — Z923 Personal history of irradiation: Secondary | ICD-10-CM | POA: Diagnosis not present

## 2017-12-13 DIAGNOSIS — C61 Malignant neoplasm of prostate: Secondary | ICD-10-CM | POA: Diagnosis not present

## 2017-12-13 LAB — URINALYSIS, COMPLETE
Bilirubin, UA: NEGATIVE
Ketones, UA: NEGATIVE
Nitrite, UA: NEGATIVE
PH UA: 5 (ref 5.0–7.5)
PROTEIN UA: NEGATIVE
RBC, UA: NEGATIVE
Specific Gravity, UA: 1.01 (ref 1.005–1.030)
UUROB: 0.2 mg/dL (ref 0.2–1.0)

## 2017-12-13 LAB — BUN+CREAT
BUN / CREAT RATIO: 16 (ref 10–24)
BUN: 23 mg/dL (ref 8–27)
Creatinine, Ser: 1.48 mg/dL — ABNORMAL HIGH (ref 0.76–1.27)
GFR, EST AFRICAN AMERICAN: 48 mL/min/{1.73_m2} — AB (ref >59)
GFR, EST NON AFRICAN AMERICAN: 42 mL/min/{1.73_m2} — AB (ref >59)

## 2017-12-13 LAB — MICROSCOPIC EXAMINATION
Bacteria, UA: NONE SEEN
EPITHELIAL CELLS (NON RENAL): NONE SEEN /HPF (ref 0–10)

## 2017-12-14 ENCOUNTER — Telehealth: Payer: Self-pay

## 2017-12-14 ENCOUNTER — Ambulatory Visit (INDEPENDENT_AMBULATORY_CARE_PROVIDER_SITE_OTHER): Payer: PPO | Admitting: Urology

## 2017-12-14 ENCOUNTER — Encounter: Payer: Self-pay | Admitting: Urology

## 2017-12-14 VITALS — BP 101/51 | HR 85 | Ht 62.0 in | Wt 185.0 lb

## 2017-12-14 DIAGNOSIS — R310 Gross hematuria: Secondary | ICD-10-CM

## 2017-12-14 DIAGNOSIS — R319 Hematuria, unspecified: Secondary | ICD-10-CM | POA: Diagnosis not present

## 2017-12-14 LAB — URINALYSIS, COMPLETE
Bilirubin, UA: NEGATIVE
KETONES UA: NEGATIVE
Leukocytes, UA: NEGATIVE
NITRITE UA: NEGATIVE
Protein, UA: NEGATIVE
RBC, UA: NEGATIVE
Urobilinogen, Ur: 0.2 mg/dL (ref 0.2–1.0)
pH, UA: 5 (ref 5.0–7.5)

## 2017-12-14 LAB — MICROSCOPIC EXAMINATION: RBC MICROSCOPIC, UA: NONE SEEN /HPF (ref 0–2)

## 2017-12-14 MED ORDER — CIPROFLOXACIN HCL 500 MG PO TABS
500.0000 mg | ORAL_TABLET | Freq: Once | ORAL | Status: AC
  Administered 2017-12-14: 500 mg via ORAL

## 2017-12-14 MED ORDER — LIDOCAINE HCL URETHRAL/MUCOSAL 2 % EX GEL
1.0000 "application " | Freq: Once | CUTANEOUS | Status: AC
  Administered 2017-12-14: 1 via URETHRAL

## 2017-12-14 NOTE — Telephone Encounter (Signed)
Called pt's daughter no answer, LM informing her of results.

## 2017-12-14 NOTE — Telephone Encounter (Signed)
-----   Message from Nori Riis, PA-C sent at 12/13/2017  9:19 AM EDT ----- Please let Mr. Wainer and his daughter, [name], know that his creatinine has come down to 1.48.

## 2017-12-15 NOTE — Progress Notes (Signed)
   12/15/17  CC:  Chief Complaint  Patient presents with  . Cysto    HPI: Refer to Larene Beach McGowan's note dated 12/12/2017.  He denies recurrent gross hematuria.  Blood pressure (!) 101/51, pulse 85, height 5\' 2"  (1.575 m), weight 185 lb (83.9 kg). NED. A&Ox3.   No respiratory distress   Abd soft, NT, ND Normal phallus with bilateral descended testicles  Cystoscopy Procedure Note  Patient identification was confirmed, informed consent was obtained, and patient was prepped using Betadine solution.  Lidocaine jelly was administered per urethral meatus.    Preoperative abx where received prior to procedure.     Pre-Procedure: - Inspection reveals a normal caliber ureteral meatus.  Procedure: The flexible cystoscope was introduced without difficulty - No urethral strictures/lesions are present. - Moderate lateral lobe enlargement with prominent hypervascularity and friability prostate  - Mild elevation bladder neck - Bilateral ureteral orifices identified - Bladder mucosa  reveals no ulcers, tumors, or lesions - No bladder stones - No trabeculation  Retroflexion shows back bleeding from prostatic urethra and prominent hypervascularity bladder neck   Post-Procedure: - Patient tolerated the procedure well  Assessment/ Plan: No bladder pathology.  Prominent hypervascularity prostatic urethra with friability which is the most likely source of his bleeding.  He has a follow-up scheduled in medical oncology for his prostate cancer.  He will keep his regular scheduled follow-up here and was instructed to call for recurrent gross hematuria.

## 2017-12-18 ENCOUNTER — Other Ambulatory Visit: Payer: Self-pay | Admitting: Family Medicine

## 2017-12-18 ENCOUNTER — Telehealth: Payer: Self-pay | Admitting: Urology

## 2017-12-18 NOTE — Telephone Encounter (Signed)
Pt was seen in our office for Cysto last Thurs, pt daughter calls today asking about a Rx that was supposed to be sent in for swollen vessels, states nothing has been sent to his pharmacy, [name] is a little confused as she wasn't at this appt and would like for someone to call her to clarify is last appt and any medications. Please call [name] at [phone]

## 2017-12-18 NOTE — Telephone Encounter (Signed)
Spoke with patient's daughter and questions answered , no medications sent at cysto apt, patient misunderstood

## 2017-12-20 ENCOUNTER — Other Ambulatory Visit: Payer: Self-pay | Admitting: Urology

## 2017-12-23 NOTE — Progress Notes (Signed)
Urine cytology neg for malignant cells

## 2017-12-25 NOTE — Progress Notes (Signed)
Patient notified

## 2018-01-05 ENCOUNTER — Other Ambulatory Visit: Payer: Self-pay | Admitting: Family Medicine

## 2018-01-05 DIAGNOSIS — E113492 Type 2 diabetes mellitus with severe nonproliferative diabetic retinopathy without macular edema, left eye: Secondary | ICD-10-CM

## 2018-01-05 DIAGNOSIS — E1165 Type 2 diabetes mellitus with hyperglycemia: Principal | ICD-10-CM

## 2018-01-05 DIAGNOSIS — IMO0002 Reserved for concepts with insufficient information to code with codable children: Secondary | ICD-10-CM

## 2018-01-08 ENCOUNTER — Other Ambulatory Visit: Payer: Self-pay | Admitting: Family Medicine

## 2018-01-08 NOTE — Telephone Encounter (Signed)
Last OV 09/14/2017   Jardiance last refilled 12/12/2017   Protonix last refilled 12/12/2017

## 2018-01-31 ENCOUNTER — Ambulatory Visit: Payer: PPO

## 2018-01-31 ENCOUNTER — Ambulatory Visit (INDEPENDENT_AMBULATORY_CARE_PROVIDER_SITE_OTHER): Payer: PPO

## 2018-01-31 DIAGNOSIS — Z23 Encounter for immunization: Secondary | ICD-10-CM

## 2018-02-04 ENCOUNTER — Other Ambulatory Visit: Payer: Self-pay | Admitting: Family Medicine

## 2018-02-16 ENCOUNTER — Other Ambulatory Visit: Payer: Self-pay | Admitting: *Deleted

## 2018-02-16 DIAGNOSIS — D696 Thrombocytopenia, unspecified: Secondary | ICD-10-CM

## 2018-02-18 NOTE — Progress Notes (Signed)
[place]  Telephone:([phone] [phone]  ID: Matthew Miles. OB: March 27, 1929  MR#: [id number]  JME#:[id number]  Patient Care Team: [name], MD as PCP - General (Family Medicine) Minna Merritts, MD as Consulting Physician (Cardiology)  CHIEF COMPLAINT: Thrombocytopenia.  INTERVAL HISTORY: Patient returns to clinic today for repeat laboratory work and routine six-month evaluation.  He continues to feel well and remains asymptomatic. He has no neurologic complaints. He denies any easy bleeding or bruising.  He has a good appetite and denies weight loss.  He denies any fevers or illnesses.  He has no chest pain or shortness of breath.  He denies any nausea, vomiting, constipation, or diarrhea.  He has no urinary complaints.  Patient feels at his baseline offers no specific complaints today.  REVIEW OF SYSTEMS:   Review of Systems  Constitutional: Negative for fever, malaise/fatigue and weight loss.  Respiratory: Negative.  Negative for cough, hemoptysis and shortness of breath.   Cardiovascular: Negative.  Negative for chest pain and leg swelling.  Gastrointestinal: Negative.  Negative for blood in stool and melena.  Genitourinary: Negative.   Musculoskeletal: Negative.   Skin: Negative.  Negative for rash.  Neurological: Negative.  Negative for weakness.  Endo/Heme/Allergies: Does not bruise/bleed easily.  Psychiatric/Behavioral: Negative.  The patient is not nervous/anxious.     As per HPI. Otherwise, a complete review of systems is negative.  PAST MEDICAL HISTORY: Past Medical History:  Diagnosis Date  . Allergy   . Arthritis   . CHF (congestive heart failure) ([place])   . Chicken pox   . Cholecystitis   . Colon polyps   . COPD (chronic obstructive pulmonary disease) ([place])   . Coronary artery disease   . Diabetes mellitus without complication ([place])   . Emphysema of lung ([place])   . GERD (gastroesophageal reflux disease)   . Heart  murmur   . Hematemesis/vomiting blood 04/10/11  . Hypercholesterolemia   . Mild hypertension   . Pancreatitis   . Prostate cancer ([place])   . Prostate cancer [facility])    radiation therapy   . Smoker   . Ulcer   . Upper GI bleed 1980    PAST SURGICAL HISTORY: Past Surgical History:  Procedure Laterality Date  . APPENDECTOMY    . CARDIAC CATHETERIZATION  May 2002 and Feb 2013   [facility]; no stents   . CATARACT EXTRACTION    . CHOLECYSTECTOMY    . CIRCUMCISION    . COLONOSCOPY    . HEMORRHOID SURGERY    . SP CHOLECYSTOMY    . STOMACH SURGERY     bleeding ulcers, followed by Dr. [name]    FAMILY HISTORY Family History  Problem Relation Age of Onset  . Prostate cancer Father   . Liver cancer Brother   . Prostate cancer Brother   . Emphysema Sister        smoker       ADVANCED DIRECTIVES:    HEALTH MAINTENANCE: Social History   Tobacco Use  . Smoking status: Former Smoker    Packs/day: 1.00    Years: 65.00    Pack years: 65.00    Types: Cigarettes    Last attempt to quit: 04/10/2011    Years since quitting: 6.8  . Smokeless tobacco: Former Systems developer    Types: Chew  Substance Use Topics  . Alcohol use: Yes    Comment: 1 beer rarely  . Drug use: No     Colonoscopy:  PAP:  Bone  density:  Lipid panel:  Allergies  Allergen Reactions  . Sulfa Antibiotics     GI upset  . Tradjenta [Linagliptin] Other (See Comments)    Hair loss    Current Outpatient Medications  Medication Sig Dispense Refill  . alfuzosin (UROXATRAL) 10 MG 24 hr tablet Take 1 tablet (10 mg total) by mouth daily with breakfast. 30 tablet 11  . aspirin 81 MG tablet Take 81 mg by mouth daily.     . Aspirin-Calcium Carbonate 81-777 MG TABS Take by mouth.    . Blood Glucose Monitoring Suppl (FIFTY50 GLUCOSE METER 2.0) w/Device KIT Use as directed    . calcium-vitamin D 250-100 MG-UNIT tablet Take 1 tablet by mouth 2 (two) times daily.    . carvedilol (COREG) 6.25 MG tablet TAKE 1 TABLET(6.25 MG) BY  MOUTH TWICE DAILY 180 tablet 1  . Cyanocobalamin (B-12) 1000 MCG TBCR Take 1 tablet by mouth daily.     Marland Kitchen glipiZIDE (GLUCOTROL XL) 10 MG 24 hr tablet TAKE 1 TABLET(10 MG) BY MOUTH TWICE DAILY 180 tablet 0  . JANUVIA 50 MG tablet   9  . JARDIANCE 10 MG TABS tablet TAKE 1 TABLET BY MOUTH DAILY 30 tablet 0  . Leuprolide Acetate, 6 Month, (LUPRON) 45 MG injection Inject into the muscle.    . metFORMIN (GLUCOPHAGE-XR) 500 MG 24 hr tablet TAKE 2 TABLETS(1000 MG) BY MOUTH TWICE DAILY 360 tablet 3  . metFORMIN (GLUCOPHAGE-XR) 500 MG 24 hr tablet TAKE 2 TABLETS(1000 MG) BY MOUTH TWICE DAILY 360 tablet 0  . nitroGLYCERIN (NITROSTAT) 0.4 MG SL tablet Place 1 tablet (0.4 mg total) under the tongue every 5 (five) minutes as needed. 25 tablet 0  . nystatin cream (MYCOSTATIN) Apply 1 application topically 2 (two) times daily. 30 g 0  . omeprazole (PRILOSEC) 20 MG capsule Take by mouth.    Glory Rosebush VERIO test strip USE TO TEST BLOOD SUGAR TWICE DAILY 200 each 0  . pantoprazole (PROTONIX) 20 MG tablet TAKE 1 TABLET(20 MG) BY MOUTH DAILY 30 tablet 0  . potassium chloride SA (K-DUR,KLOR-CON) 20 MEQ tablet TAKE 1 TABLET(20 MEQ) BY MOUTH DAILY 90 tablet 0  . pravastatin (PRAVACHOL) 40 MG tablet TAKE 1 TABLET(40 MG) BY MOUTH DAILY 90 tablet 1  . PROAIR HFA 108 (90 Base) MCG/ACT inhaler INHALE 2 PUFFS INTO THE LUNGS EVERY 6 HOURS AS NEEDED FOR WHEEZING OR SHORTNESS OF BREATH 8.5 g 0  . SPIRIVA RESPIMAT 2.5 MCG/ACT AERS INHALE 2 PUFFS INTO THE LUNGS DAILY 4 g 5  . torsemide (DEMADEX) 10 MG tablet Take 1 tablet (10 mg total) by mouth 2 (two) times daily as needed. 180 tablet 3   No current facility-administered medications for this visit.     OBJECTIVE: Vitals:   02/21/18 1124  BP: (!) 113/58  Pulse: 80  Temp: (!) 95.8 F (35.4 C)     Body mass index is 34.59 kg/m.    ECOG FS:0 - Asymptomatic  General: Well-developed, well-nourished, no acute distress. Eyes: Pink conjunctiva, anicteric sclera. HEENT:  Normocephalic, moist mucous membranes. Lungs: Clear to auscultation bilaterally. Heart: Regular rate and rhythm. No rubs, murmurs, or gallops. Abdomen: Soft, nontender, nondistended. No organomegaly noted, normoactive bowel sounds. Musculoskeletal: No edema, cyanosis, or clubbing. Neuro: Alert, answering all questions appropriately. Cranial nerves grossly intact. Skin: No rashes or petechiae noted. Psych: Normal affect.  LAB RESULTS:  Lab Results  Component Value Date   NA 139 08/03/2017   K 4.5 08/03/2017   CL 107 08/03/2017  CO2 24 08/03/2017   GLUCOSE 193 (H) 08/03/2017   BUN 23 12/12/2017   CREATININE 1.48 (H) 12/12/2017   CALCIUM 8.7 08/03/2017   PROT 6.3 02/17/2017   ALBUMIN 4.1 02/17/2017   AST 10 02/17/2017   ALT 11 02/17/2017   ALKPHOS 59 02/17/2017   BILITOT 0.7 02/17/2017   GFRNONAA 42 (L) 12/12/2017   GFRAA 48 (L) 12/12/2017    Lab Results  Component Value Date   WBC 5.1 02/21/2018   NEUTROABS 3.3 02/21/2018   HGB 9.8 (L) 02/21/2018   HCT 32.4 (L) 02/21/2018   MCV 85.0 02/21/2018   PLT 83 (L) 02/21/2018     STUDIES: No results found.  ASSESSMENT:  Thrombocytopenia.  PLAN:    1.  Thrombocytopenia: Patient's platelet count has trended down slightly to 83.  All of his other laboratory work was either negative or within normal limits except for positive platelet antibodies in January 2017. These can be transient nature and will consider repeating test in the future, but this is not necessary at this time.  No intervention is needed at this time.  Patient does not require bone marrow biopsy.  Because patient has trended down and is below 100, will continue routine follow-up every 6 months for the time being. 2. Anemia: Hemoglobin is also trended down to 2009.8.  Monitor.  I spent a total of 20 minutes face-to-face with the patient of which greater than 50% of the visit was spent in counseling and coordination of care as detailed above.   Patient  expressed understanding and was in agreement with this plan. He also understands that He can call clinic at any time with any questions, concerns, or complaints.   Lloyd Huger, MD   02/23/2018 9:16 AM

## 2018-02-21 ENCOUNTER — Other Ambulatory Visit: Payer: Self-pay

## 2018-02-21 ENCOUNTER — Encounter: Payer: Self-pay | Admitting: Oncology

## 2018-02-21 ENCOUNTER — Inpatient Hospital Stay: Payer: PPO | Attending: Oncology | Admitting: Oncology

## 2018-02-21 ENCOUNTER — Inpatient Hospital Stay: Payer: PPO

## 2018-02-21 VITALS — BP 113/58 | HR 80 | Temp 95.8°F | Wt 189.1 lb

## 2018-02-21 DIAGNOSIS — D649 Anemia, unspecified: Secondary | ICD-10-CM | POA: Insufficient documentation

## 2018-02-21 DIAGNOSIS — D696 Thrombocytopenia, unspecified: Secondary | ICD-10-CM | POA: Diagnosis not present

## 2018-02-21 LAB — CBC WITH DIFFERENTIAL/PLATELET
ABS IMMATURE GRANULOCYTES: 0.02 10*3/uL (ref 0.00–0.07)
BASOS ABS: 0 10*3/uL (ref 0.0–0.1)
Basophils Relative: 0 %
Eosinophils Absolute: 0.1 10*3/uL (ref 0.0–0.5)
Eosinophils Relative: 1 %
HCT: 32.4 % — ABNORMAL LOW (ref 39.0–52.0)
HEMOGLOBIN: 9.8 g/dL — AB (ref 13.0–17.0)
Immature Granulocytes: 0 %
LYMPHS ABS: 1.3 10*3/uL (ref 0.7–4.0)
Lymphocytes Relative: 25 %
MCH: 25.7 pg — AB (ref 26.0–34.0)
MCHC: 30.2 g/dL (ref 30.0–36.0)
MCV: 85 fL (ref 80.0–100.0)
MONO ABS: 0.4 10*3/uL (ref 0.1–1.0)
MONOS PCT: 9 %
NEUTROS PCT: 65 %
Neutro Abs: 3.3 10*3/uL (ref 1.7–7.7)
Platelets: 83 10*3/uL — ABNORMAL LOW (ref 150–400)
RBC: 3.81 MIL/uL — ABNORMAL LOW (ref 4.22–5.81)
RDW: 15.8 % — ABNORMAL HIGH (ref 11.5–15.5)
WBC: 5.1 10*3/uL (ref 4.0–10.5)
nRBC: 0 % (ref 0.0–0.2)

## 2018-02-21 NOTE — Progress Notes (Signed)
Patient here today for follow up.  Patient states

## 2018-02-24 ENCOUNTER — Other Ambulatory Visit: Payer: Self-pay | Admitting: Family Medicine

## 2018-03-03 IMAGING — US US EXTREM LOW VENOUS*L*
1 series · 13 of 24 positions shown · non-contrast
Comparison: None.

CLINICAL DATA: Left lower extremity pain and swelling for the past
2 weeks. History of prostate cancer. Evaluate for DVT.



[Series 1: us extrem low venous*left* · 0.08mm/px · 13 of 37 slices shown]
[im 1/37]
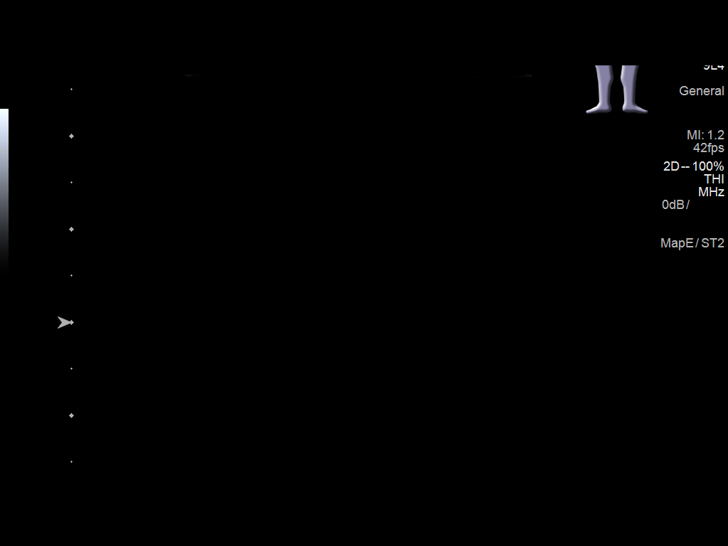
[im 4/37]
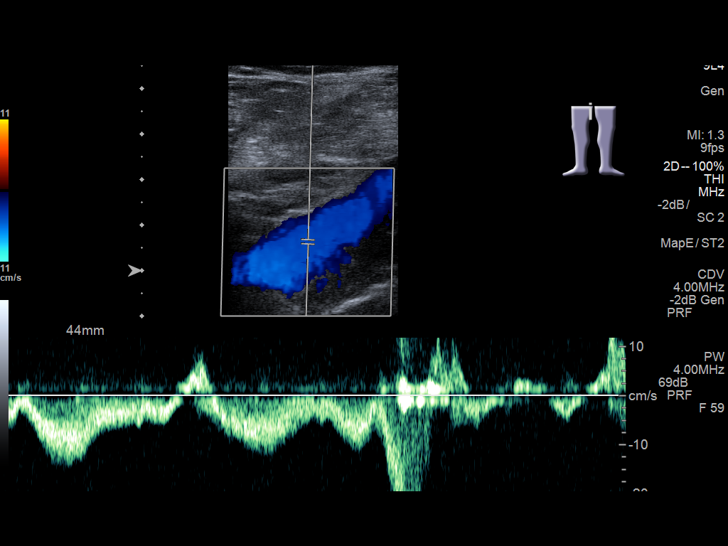
[im 7/37]
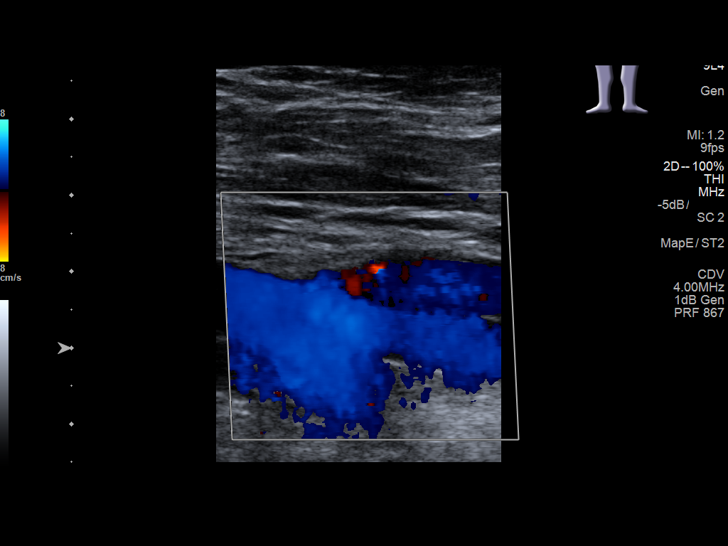
[im 10/37]
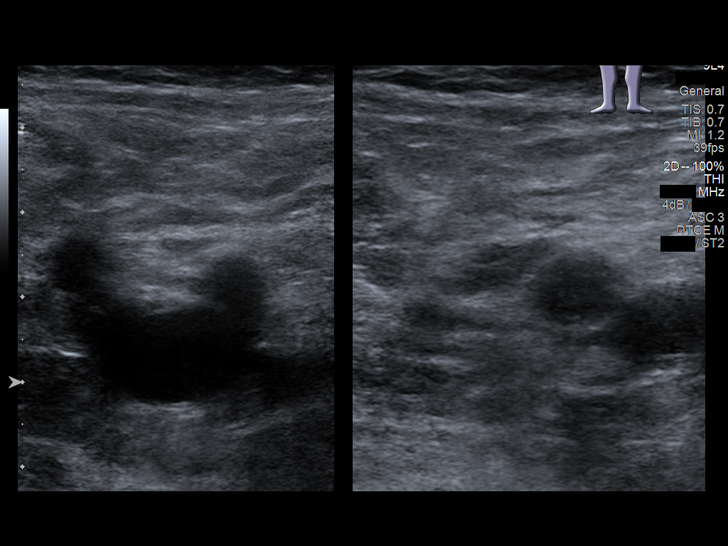
[im 13/37]
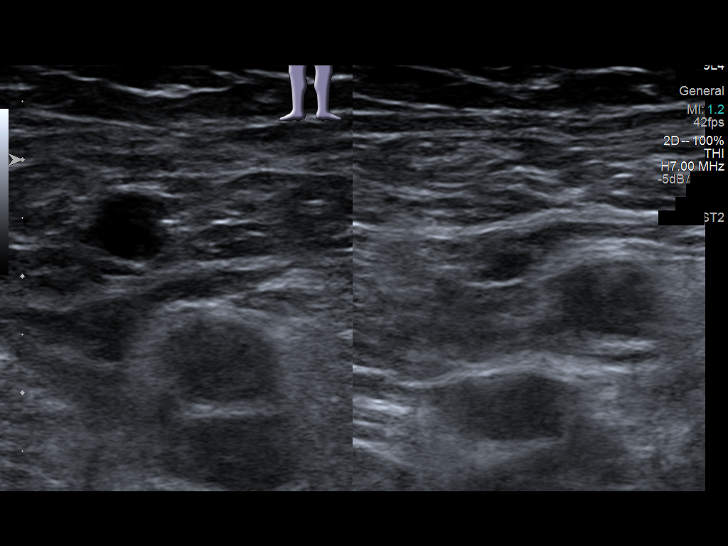
[im 16/37]
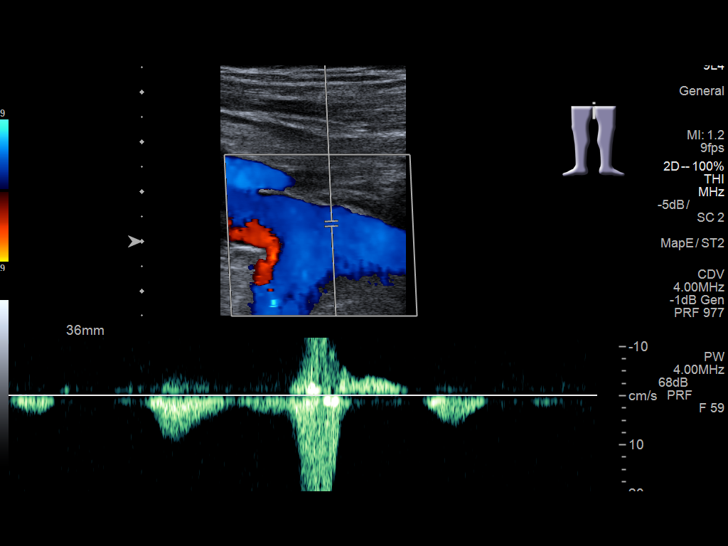
[im 19/37]
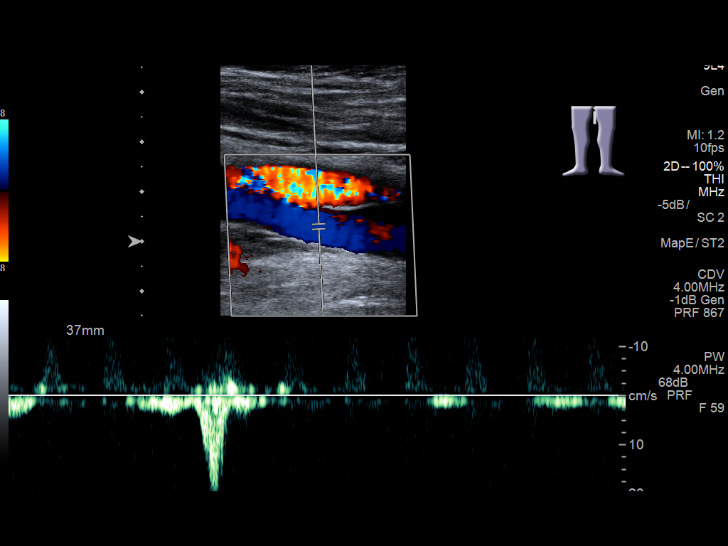
[im 21/37]
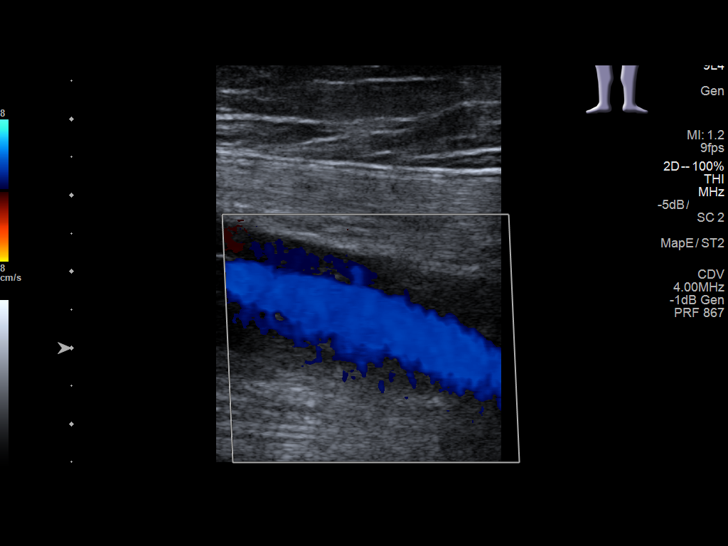
[im 24/37]
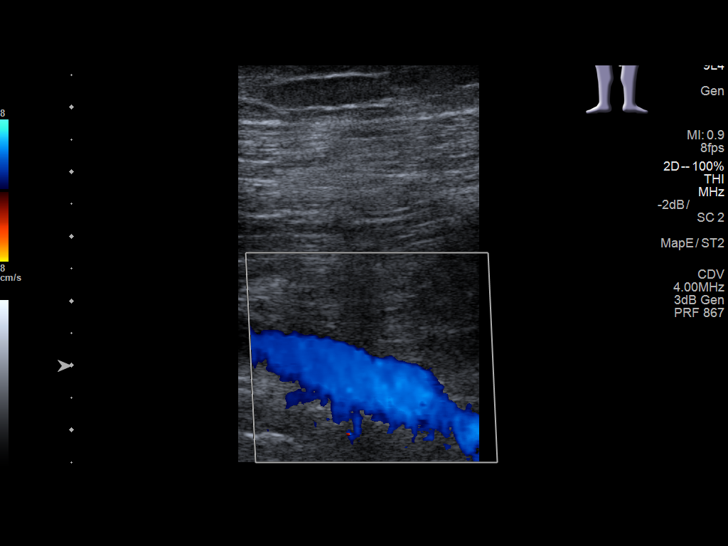
[im 27/37]
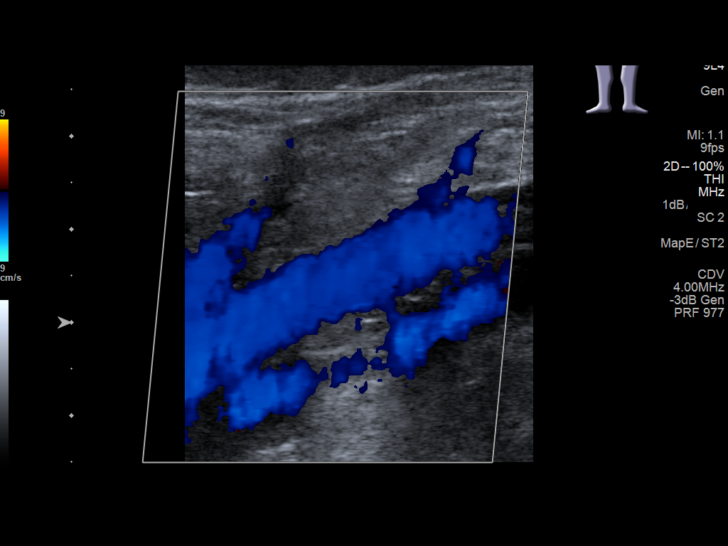
[im 30/37]
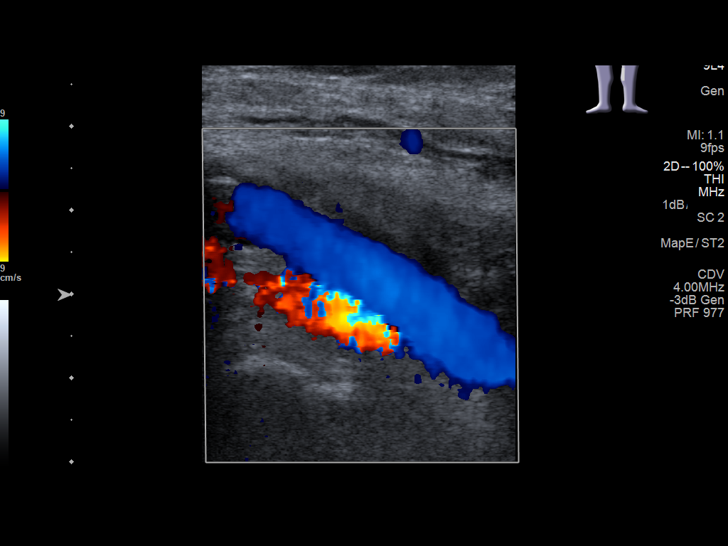
[im 33/37]
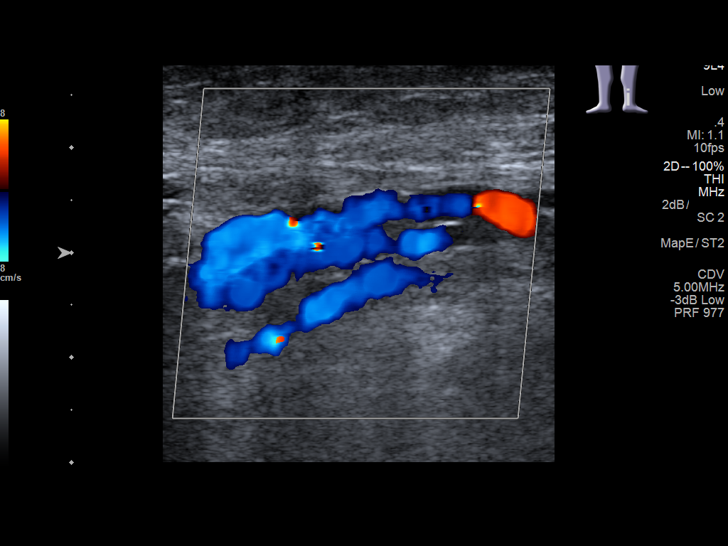
[im 37/37]
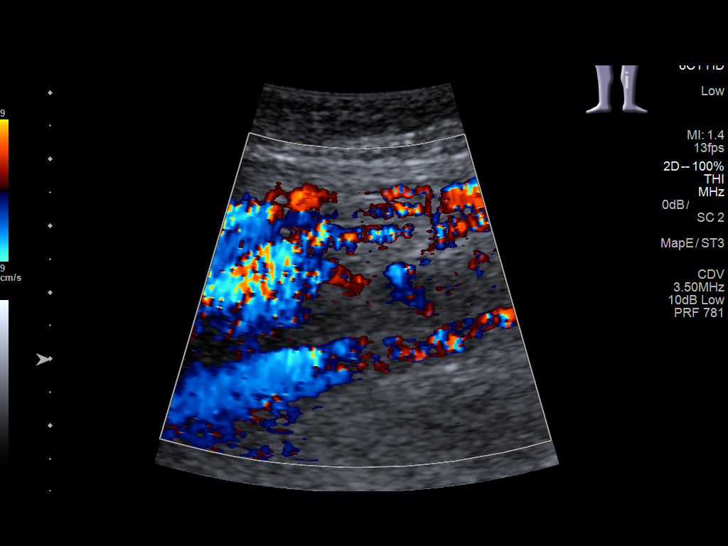

[13 of 24 positions shown; findings below may reference images not displayed]

FINDINGS: Contralateral Common Femoral Vein: Respiratory phasicity is normal
and symmetric with the symptomatic side. No evidence of thrombus.
Normal compressibility.

Common Femoral Vein: No evidence of thrombus. Normal
compressibility, respiratory phasicity and response to augmentation.

Saphenofemoral Junction: No evidence of thrombus. Normal
compressibility and flow on color Doppler imaging.

Profunda Femoral Vein: No evidence of thrombus. Normal
compressibility and flow on color Doppler imaging.

Femoral Vein: No evidence of thrombus. Normal compressibility,
respiratory phasicity and response to augmentation.

Popliteal Vein: No evidence of thrombus. Normal compressibility,
respiratory phasicity and response to augmentation.

Calf Veins: No evidence of thrombus. Normal compressibility and flow
on color Doppler imaging.

Superficial Great Saphenous Vein: No evidence of thrombus. Normal
compressibility and flow on color Doppler imaging.

Venous Reflux:  None.

Other Findings:  None.
IMPRESSION: No evidence of DVT within the left lower extremity.

## 2018-03-06 ENCOUNTER — Other Ambulatory Visit: Payer: Self-pay | Admitting: Family Medicine

## 2018-03-14 DIAGNOSIS — R972 Elevated prostate specific antigen [PSA]: Secondary | ICD-10-CM | POA: Diagnosis not present

## 2018-03-14 DIAGNOSIS — R590 Localized enlarged lymph nodes: Secondary | ICD-10-CM | POA: Diagnosis not present

## 2018-03-14 DIAGNOSIS — R918 Other nonspecific abnormal finding of lung field: Secondary | ICD-10-CM | POA: Diagnosis not present

## 2018-03-14 DIAGNOSIS — R161 Splenomegaly, not elsewhere classified: Secondary | ICD-10-CM | POA: Diagnosis not present

## 2018-03-14 DIAGNOSIS — D759 Disease of blood and blood-forming organs, unspecified: Secondary | ICD-10-CM | POA: Diagnosis not present

## 2018-03-14 DIAGNOSIS — C61 Malignant neoplasm of prostate: Secondary | ICD-10-CM | POA: Diagnosis not present

## 2018-03-14 DIAGNOSIS — Z79818 Long term (current) use of other agents affecting estrogen receptors and estrogen levels: Secondary | ICD-10-CM | POA: Diagnosis not present

## 2018-03-14 DIAGNOSIS — Z8546 Personal history of malignant neoplasm of prostate: Secondary | ICD-10-CM | POA: Diagnosis not present

## 2018-03-14 DIAGNOSIS — Z87440 Personal history of urinary (tract) infections: Secondary | ICD-10-CM | POA: Diagnosis not present

## 2018-03-14 DIAGNOSIS — Z923 Personal history of irradiation: Secondary | ICD-10-CM | POA: Diagnosis not present

## 2018-03-16 ENCOUNTER — Encounter: Payer: Self-pay | Admitting: Family Medicine

## 2018-03-16 ENCOUNTER — Ambulatory Visit (INDEPENDENT_AMBULATORY_CARE_PROVIDER_SITE_OTHER): Payer: PPO | Admitting: Family Medicine

## 2018-03-16 VITALS — BP 100/56 | HR 92 | Temp 97.7°F | Resp 18 | Ht 62.0 in | Wt 188.4 lb

## 2018-03-16 DIAGNOSIS — E119 Type 2 diabetes mellitus without complications: Secondary | ICD-10-CM

## 2018-03-16 DIAGNOSIS — E782 Mixed hyperlipidemia: Secondary | ICD-10-CM

## 2018-03-16 DIAGNOSIS — Z8546 Personal history of malignant neoplasm of prostate: Secondary | ICD-10-CM | POA: Diagnosis not present

## 2018-03-16 DIAGNOSIS — I10 Essential (primary) hypertension: Secondary | ICD-10-CM

## 2018-03-16 DIAGNOSIS — D696 Thrombocytopenia, unspecified: Secondary | ICD-10-CM

## 2018-03-16 DIAGNOSIS — N3001 Acute cystitis with hematuria: Secondary | ICD-10-CM | POA: Diagnosis not present

## 2018-03-16 DIAGNOSIS — W19XXXA Unspecified fall, initial encounter: Secondary | ICD-10-CM | POA: Diagnosis not present

## 2018-03-16 LAB — HEMOGLOBIN A1C: Hgb A1c MFr Bld: 7.3 % — ABNORMAL HIGH (ref 4.6–6.5)

## 2018-03-16 LAB — LIPID PANEL
CHOL/HDL RATIO: 4
Cholesterol: 104 mg/dL (ref 0–200)
HDL: 28.5 mg/dL — AB (ref >39.00)
LDL Cholesterol: 51 mg/dL (ref 0–99)
NONHDL: 75.02
TRIGLYCERIDES: 118 mg/dL (ref 0.0–149.0)
VLDL: 23.6 mg/dL (ref 0.0–40.0)

## 2018-03-16 NOTE — Patient Instructions (Addendum)
Nice to see you. We will check lab work today and contact you with the results. We will get you to physical therapy. I will check with your cardiologist regarding your aspirin and your carvedilol.

## 2018-03-16 NOTE — Assessment & Plan Note (Signed)
Check A1c.  Continue current regimen. 

## 2018-03-16 NOTE — Assessment & Plan Note (Signed)
Check lipid panel.  Continue current regimen. 

## 2018-03-16 NOTE — Assessment & Plan Note (Signed)
Patient with borderline low blood pressures.  We will touch base with his cardiologist to see what they think about decreasing his carvedilol dose.

## 2018-03-16 NOTE — Assessment & Plan Note (Signed)
Patient has been evaluated by urology.

## 2018-03-16 NOTE — Assessment & Plan Note (Signed)
Neurologically intact.  Physical therapy order placed.  Discussed the importance of this.

## 2018-03-16 NOTE — Progress Notes (Signed)
Matthew Rumps, MD Phone: [phone]  Matthew Miles. is a 84 y.o. male who presents today for follow-up.  CC: Diabetes, hypertension, prostate cancer, UTI/hematuria, thrombocytopenia, falls  Diabetes: Blood glucose is 127 this morning.  He had 2 bologna sandwiches for dinner last night.  He can be down into the 90s if he watches what he eats.  He is taking metformin, glipizide, Januvia, and Jardiance.  He denies polyuria.  Occasional polydipsia.  No hypoglycemia.  Hypertension: Notes his blood pressure is typically similar to today.  He is on carvedilol.  He takes an aspirin.  He notes no chest pain, shortness of breath, or edema.  No lightheadedness.  Prostate cancer/UTI/hematuria: He is now following with [facility].  He is on Lupron injections.  His PSA has trended down.  He is also following with urology for prior UTI and gross hematuria.  He underwent CT imaging and cystoscopy as well as nuclear medicine bone scan.  The CT scan did reveal enlarged mediastinal and hilar lymph nodes concerning for metastatic prostate cancer.  There is also an area on the nuclear medicine bone scan in his pelvis that was concerning.  The patient and his daughter report they met with the oncology physician at [facility] and state they were advised that the area in his pelvis was not concerning to them.  The patient and daughter also note that the oncologist at [facility] is going to be monitoring the lymph nodes as well.  He has had no recurrence of UTI symptoms or hematuria.  Thrombocytopenia: Patient has followed up with hematology locally.  It appears the plan was to monitor this and his anemia.  Labs were stable on recent check at [facility].  Falls: Patient's daughter notes the patient has fallen several times in the last year.  Notes he will stumble or trip on something.  He notes no dizziness or balance issues.  His last fall was last month when he tripped in his workshop.  He notes no injury.  He did not hit his head.     Social History   Tobacco Use  Smoking Status Former Smoker  . Packs/day: 1.00  . Years: 65.00  . Pack years: 65.00  . Types: Cigarettes  . Last attempt to quit: 04/10/2011  . Years since quitting: 6.9  Smokeless Tobacco Former Systems developer  . Types: Chew     ROS see history of present illness  Objective  Physical Exam Vitals:   03/16/18 1312 03/16/18 1348  BP: (!) 106/50 (!) 100/56  Pulse: 92   Resp: 18   Temp: 97.7 F (36.5 C)   SpO2: 98%     BP Readings from Last 3 Encounters:  03/16/18 (!) 100/56  02/21/18 (!) 113/58  12/14/17 (!) 101/51   Wt Readings from Last 3 Encounters:  03/16/18 188 lb 6.4 oz (85.5 kg)  02/21/18 189 lb 2 oz (85.8 kg)  12/14/17 185 lb (83.9 kg)    Physical Exam  Constitutional: No distress.  Cardiovascular: Normal rate, regular rhythm and normal heart sounds.  Pulmonary/Chest: Effort normal and breath sounds normal.  Musculoskeletal: He exhibits no edema.  Neurological: He is alert.  CN 2-12 intact, 5/5 strength in bilateral biceps, triceps, grip, quads, hamstrings, plantar and dorsiflexion, sensation to light touch intact in bilateral UE and LE, normal gait, absent patellar reflexes  Skin: Skin is warm and dry. He is not diaphoretic.     Assessment/Plan: Please see individual problem list.  Essential hypertension Patient with borderline low blood pressures.  We will touch base with his cardiologist to see what they think about decreasing his carvedilol dose.  Diabetes (HCC) Check A1c.  Continue current regimen.  H/O malignant neoplasm of prostate He will continue to see oncology at [facility].  Hyperlipidemia Check lipid panel.  Continue current regimen.  Falls Neurologically intact.  Physical therapy order placed.  Discussed the importance of this.  Thrombocytopenia ([place]) He will continue to see oncology for this.  Acute cystitis with hematuria Patient has been evaluated by urology.    Orders Placed This Encounter   Procedures  . HgB A1c  . Lipid panel  . Ambulatory referral to Physical Therapy    Referral Priority:   Routine    Referral Type:   Physical Medicine    Referral Reason:   Specialty Services Required    Requested Specialty:   Physical Therapy    Number of Visits Requested:   1    No orders of the defined types were placed in this encounter.    Matthew Rumps, MD [place]

## 2018-03-16 NOTE — Assessment & Plan Note (Signed)
He will continue to see oncology at [facility].

## 2018-03-16 NOTE — Assessment & Plan Note (Signed)
He will continue to see oncology for this.

## 2018-03-20 ENCOUNTER — Telehealth: Payer: Self-pay

## 2018-03-20 NOTE — Telephone Encounter (Signed)
Pt's daughter calling to check on the status of PT?   Sent to [employer]

## 2018-03-21 ENCOUNTER — Telehealth: Payer: Self-pay | Admitting: Urology

## 2018-03-21 NOTE — Telephone Encounter (Signed)
Spoke with patient's daughter states that they will continue his car with [facility] oncology.

## 2018-03-27 ENCOUNTER — Telehealth: Payer: Self-pay | Admitting: Family Medicine

## 2018-03-27 NOTE — Telephone Encounter (Signed)
Please the patient know I heard back from the patient's cardiologist.  They recommended continuing his carvedilol at his current dose of 6.25 mg twice daily.  They also recommended staying on aspirin.  Thanks.

## 2018-03-27 NOTE — Telephone Encounter (Signed)
-----   Message from Minna Merritts, MD sent at 03/24/2018  4:42 PM EST ----- I would probably keep the coreg 6.25 BID, Mean arterial pressure within a reasonable range Would stay on asa, known CAD, occluded LAD Thx TGollan  ----- Message ----- From: [name], MD Sent: 03/16/2018   6:15 PM EST To: Minna Merritts, MD  Hi Dr Rockey Situ,   I saw Matthew Miles in the office today. He seems to be doing quite well though his BP is running in the 58I diastolically at home. He denies any symptoms though I wanted to check with you to get your thought on decreasing his coreg dose given his low diastolic BPs. He also questioned whether or not he should remain on his aspirin. Given the recent studies regarding aspirin benefit vs risk in the elderly I wanted to see what your thoughts were on his aspirin therapy given his CAD. Thanks for your help.   Randall Hiss

## 2018-03-27 NOTE — Telephone Encounter (Signed)
Called and spoke with patient's daughter. Daughter advised and voiced understanding.

## 2018-04-02 ENCOUNTER — Other Ambulatory Visit: Payer: Self-pay | Admitting: Family Medicine

## 2018-04-03 ENCOUNTER — Other Ambulatory Visit: Payer: Self-pay | Admitting: Family Medicine

## 2018-04-05 ENCOUNTER — Encounter: Payer: Self-pay | Admitting: Physical Therapy

## 2018-04-05 ENCOUNTER — Ambulatory Visit: Payer: PPO | Attending: Family Medicine | Admitting: Physical Therapy

## 2018-04-05 DIAGNOSIS — R296 Repeated falls: Secondary | ICD-10-CM | POA: Insufficient documentation

## 2018-04-05 DIAGNOSIS — R262 Difficulty in walking, not elsewhere classified: Secondary | ICD-10-CM | POA: Diagnosis not present

## 2018-04-05 NOTE — Therapy (Addendum)
[facility] [address], [place], [address] Phone: [phone]   Fax:  [phone]  Physical Therapy Evaluation  Patient Details  Name: Matthew Miles. MRN: [id number] Date of Birth: [birth year] Referring Provider (PT): [name]   Encounter Date: 04/05/2018  PT End of Session - 04/05/18 1029    Visit Number  1    Number of Visits  17    Date for PT Re-Evaluation  05/31/18    PT Start Time  2706    PT Stop Time  1115    PT Time Calculation (min)  60 min    Equipment Utilized During Treatment  Gait belt    Activity Tolerance  Patient tolerated treatment well    Behavior During Therapy  WFL for tasks assessed/performed       Past Medical History:  Diagnosis Date  . Allergy   . Arthritis   . CHF (congestive heart failure) ([place])   . Chicken pox   . Cholecystitis   . Colon polyps   . COPD (chronic obstructive pulmonary disease) ([place])   . Coronary artery disease   . Diabetes mellitus without complication ([place])   . Emphysema of lung ([place])   . GERD (gastroesophageal reflux disease)   . Heart murmur   . Hematemesis/vomiting blood 04/10/11  . Hypercholesterolemia   . Mild hypertension   . Pancreatitis   . Prostate cancer ([place])   . Prostate cancer [facility])    radiation therapy   . Smoker   . Ulcer   . Upper GI bleed 1980    Past Surgical History:  Procedure Laterality Date  . APPENDECTOMY    . CARDIAC CATHETERIZATION  May 2002 and Feb 2013   [facility]; no stents   . CATARACT EXTRACTION    . CHOLECYSTECTOMY    . CIRCUMCISION    . COLONOSCOPY    . HEMORRHOID SURGERY    . SP CHOLECYSTOMY    . STOMACH SURGERY     bleeding ulcers, followed by Dr. [name]    There were no vitals filed for this visit.  Subjective Assessment - 04/05/18 1018    Pertinent History  Patient is an 84 year old male with current medical status: COPD, prostate cancer (currently on injection therapy PSA trending down), high  BP, presenting with concerns of frequent falls. Patient reports he falls "once and a while", and reports he trips over things on the ground and may just "not be picking his feet up". Patient reports he is usually able to catch himself on something, but that he did stumble outside 6 months ago, and "there was nothing to catch" and he fell on the ground. Patient denies hitting his head or injuries (other than brusing) from falls. Patient reports he trips/stumbles" 1x/month or two". Patient denies any pain, visual disturbances, or sensation deficits.  Pt denies N/V, unexplained weight fluctuation, saddle paresthesia, fever, night sweats, or unrelenting night pain at this time; some B&B changes d/t prostate cancer    Limitations  Walking;House hold activities    How long can you sit comfortably?  unlimited    How long can you stand comfortably?  unlimited    How long can you walk comfortably?  unlimited    Diagnostic tests  none to date    Currently in Pain?  No/denies    Multiple Pain Sites  No                 OBJECTIVE  Mental  Status Patient is oriented to person, place and time.  Recent memory is intact.  Remote memory is intact.  Attention span and concentration are intact.  Expressive speech is intact.  Patient's fund of knowledge is within normal limits for educational level.  SENSATION: Grossly intact to light touch bilateral L as determined by testing dermatomes L2-S2 Proprioception and hot/cold testing deferred on this date   MUSCULOSKELETAL: Tremor: None Bulk: Normal Tone: Normal   Gait Patient ambulates with decreased lumbar lordosis, decreased b/l hip flexion and ankle DF with minimal foot clearance and push off (shuffle gait)   Strength (out of 5) R/L 4+/4+ Hip flexion 4+/4+ Hip ER 5/5 Hip IR 4-/4- Hip abduction 5/5 Hip adduction 5/5 Hip extension 5/5 Knee extension 5/5 Knee flexion 4+/4+ Ankle dorsiflexion Ankle PF: unable to complete SL heel raise; 10  b/l heel raises 100% *Indicates pain   AROM (degrees) Some limitation d/t soft tissue restrictions (+ 90/90 bilat), hip IR 75% limited bilat, all other motions wnl  PROM (degrees) PROM = AROM  Muscle Length Hamstrings:+ bilat [place]: - bilat [place]: - bilat   SPECIAL TESTS BERG 50 DGI 15 (0/3 on obstacle negotiation, trips over shoebox) TUG 10.31sec Gait Speed 0.62m/s 10MWT Modified CATSIB:   Firm surface eyes open 30sec  Firm surface eyes closed 30sec  Foam surface eyes open 30sec  Foam surface eyes closed 30sec with increased postural sway and hip strategy   NeuroMuscular Re-Ed Discussion with patient on the components of balance and importance of each (strength, vision, sensation), and how they play into motor control and coordination to ensure safe obstacle negotiation. Educated patient on his current deficits based off testing. Education on length/tension relationship and the importance of strength for foot clearance/hip flex in gait, and the importance of stretching muscles that are inhibiting this as well. Demonstrated hamstring stretch to begin working on at home 30sec hold              [facility] PT Assessment - 04/05/18 0001      Assessment   Medical Diagnosis  frequent falls    Referring Provider (PT)  [name]    Onset Date/Surgical Date  04/05/17    Prior Therapy  none      Balance Screen   Has the patient fallen in the past 6 months  Yes    How many times?  3    Has the patient had a decrease in activity level because of a fear of falling?   No    Is the patient reluctant to leave their home because of a fear of falling?   No      Home Environment   Living Environment  Private residence    Living Arrangements  Alone    Available Help at Discharge  Family    Type of [place] to enter    Entrance Stairs-Number of Steps  4    Entrance Stairs-Rails  Can reach both    [place]  One level    [place] - single  point;Shower seat;Grab bars - tub/shower      Prior Function   Level of Independence  Independent    Vocation  Retired    Leisure  Optometrist Movements are Fluid and Coordinated  Yes    Fine Motor Movements are  Fluid and Coordinated  Yes                           PT Education - 04/05/18 1028    Education Details  Patient was educated on diagnosis, anatomy and pathology involved, prognosis, role of PT, and was given an HEP, demonstrating exercise with proper form following verbal and tactile cues, and was given a paper hand out to continue exercise at home. Pt was educated on and agreed to plan of care.    Person(s) Educated  Patient    Methods  Explanation;Demonstration;Tactile cues;Verbal cues;Handout    Comprehension  Verbalized understanding;Returned demonstration;Verbal cues required;Tactile cues required       PT Short Term Goals - 04/05/18 1031      PT SHORT TERM GOAL #1   Title  Pt will be independent with HEP in order to improve strength and balance in order to decrease fall risk and improve function at home and work.    Time  4    Period  Days    Status  New        PT Long Term Goals - 04/05/18 1031      PT LONG TERM GOAL #1   Title   Pt will improve BERG by at least 3 points in order to demonstrate clinically significant improvement in balance.     Baseline  04/05/18 50/56    Time  8    Period  Weeks    Status  New      PT LONG TERM GOAL #2   Title  Pt will improve DGI by at least 3 points in order to demonstrate clinically significant improvement in balance and decreased risk for falls    Baseline  04/05/18 15/24    Time  8    Period  Weeks    Status  New      PT LONG TERM GOAL #3   Title  Pt will increase strength of by at least 1/2 MMT grade of hip abd in order to demonstrate improvement in strength and function.    Baseline  04/05/18 4-/5 bilat    Time   8    Period  Weeks    Status  New      PT LONG TERM GOAL #4   Title  Pt will increase 10MWT by at least 0.13 m/s in order to demonstrate clinically significant improvement in community ambulation.     Baseline  04/05/18 0.80m/s    Time  8    Period  Weeks    Status  New      PT LONG TERM GOAL #5   Title  Patient will demonstrate normalized gait pattern with stepping over shoebox obstacle    Baseline  04/05/18: decreased gait speed when approaching, decreased hip flex and ankle DF reducing foot clearance and causing LOB    Time  8    Period  Weeks    Status  New            Plan - 04/05/18 1228    Clinical Impression Statement  Patient is a 84 y/o male presenting with frequent falls, without serious injury. Patient with impairments in strength, endurance, dynamic balance, and gait, inhibiting his ability to negotiate obstacles or uneven surfaces without LOB. Due to impairments/activity limitations patient is unable to participate fully in his outdoor hobbies (carpentry, yard work) without LOB. Patient is at higher mortality risk and injury risk d/t frequent  falls. Would benefit from skilled PT to address above deficits and promote optimal return to PLOF and best possible QOL.    History and Personal Factors relevant to plan of care:  COPD, DM2, HTN    Clinical Presentation  Evolving    Clinical Presentation due to:  2 personal factors/comorbidities, 3 body systems/activity limitations/participation restrictions     Clinical Decision Making  Moderate    Rehab Potential  Good    Clinical Impairments Affecting Rehab Potential  (+) motivation (-) sedentary lifestyle, other comorbidities, age    PT Frequency  1x / week    PT Duration  8 weeks    PT Treatment/Interventions  ADLs/Self Care Home Management;Functional mobility training;Balance training;Patient/family education;Energy conservation;Taping;Passive range of motion;Dry needling;Manual techniques;Neuromuscular  re-education;Therapeutic activities;Therapeutic exercise;Gait training;Stair training;DME Instruction;Moist Heat;Electrical Stimulation;Aquatic Therapy    PT Next Visit Plan  gait training, dynamic balance tasks, lateral strenghtening    PT Home Exercise Plan  hamstring stretch    Consulted and Agree with Plan of Care  Patient       Patient will benefit from skilled therapeutic intervention in order to improve the following deficits and impairments:  Abnormal gait, Decreased balance, Decreased endurance, Difficulty walking, Cardiopulmonary status limiting activity, Decreased range of motion, Improper body mechanics, Decreased activity tolerance, Decreased strength, Increased fascial restricitons, Impaired flexibility, Postural dysfunction  Visit Diagnosis: Repeated falls  Difficulty in walking, not elsewhere classified     Problem List Patient Active Problem List   Diagnosis Date Noted  . Acute cystitis with hematuria 09/14/2017  . BPH (benign prostatic hyperplasia) 02/10/2017  . Left leg pain 10/13/2016  . Falls 08/05/2016  . Chest pain 07/30/2016  . Hip pain, right 02/03/2016  . Actinic keratoses 12/24/2015  . Osteoarthritis of both hands 12/24/2015  . Obesity (BMI 30-39.9) 10/21/2014  . Essential hypertension 07/21/2014  . Murmur, cardiac 10/15/2013  . Chronic low back pain 10/08/2013  . Chronic diastolic CHF (congestive heart failure) ([place]) 04/15/2013  . Thrombocytopenia ([place]) 08/21/2012  . Urge incontinence 07/02/2012  . Hyperlipidemia 06/08/2012  . Balanoposthitis 06/04/2012  . H/O malignant neoplasm of prostate 06/04/2012  . Diabetes ([place]) 04/26/2012  . Iron deficiency anemia 01/06/2012  . Pulmonary hypertension ([place]) 11/25/2011  . GERD (gastroesophageal reflux disease) 06/13/2011  . Coronary artery disease of native artery of native heart with stable angina pectoris ([place]) 04/26/2011  . Tachycardia 04/26/2011  . COPD (chronic obstructive pulmonary disease) ([place])     Shelton Silvas PT, DPT Shelton Silvas 04/05/2018, 12:40 PM  [place] [address] [place], [address] Phone: [phone]   Fax:  [phone]  Name: Matthew Miles. MRN: [id number] Date of Birth: 11-24-1928

## 2018-04-09 ENCOUNTER — Ambulatory Visit: Payer: PPO | Admitting: Physical Therapy

## 2018-04-12 ENCOUNTER — Ambulatory Visit: Payer: PPO | Attending: Family Medicine | Admitting: Physical Therapy

## 2018-04-12 ENCOUNTER — Encounter: Payer: Self-pay | Admitting: Physical Therapy

## 2018-04-12 DIAGNOSIS — R296 Repeated falls: Secondary | ICD-10-CM | POA: Diagnosis not present

## 2018-04-12 DIAGNOSIS — R262 Difficulty in walking, not elsewhere classified: Secondary | ICD-10-CM

## 2018-04-12 NOTE — Therapy (Signed)
[facility] [address] [place], [address] Phone: [phone]   Fax:  [phone]  Physical Therapy Treatment  Patient Details  Name: Matthew Miles. MRN: [id number] Date of Birth: [birth year] Referring Provider (PT): [name]   Encounter Date: 04/12/2018  PT End of Session - 04/12/18 1040    Visit Number  2    Number of Visits  17    Date for PT Re-Evaluation  05/31/18    PT Start Time  1030    PT Stop Time  1115    PT Time Calculation (min)  45 min    Activity Tolerance  Patient tolerated treatment well    Behavior During Therapy  [facility] for tasks assessed/performed       Past Medical History:  Diagnosis Date  . Allergy   . Arthritis   . CHF (congestive heart failure) ([place])   . Chicken pox   . Cholecystitis   . Colon polyps   . COPD (chronic obstructive pulmonary disease) ([place])   . Coronary artery disease   . Diabetes mellitus without complication ([place])   . Emphysema of lung ([place])   . GERD (gastroesophageal reflux disease)   . Heart murmur   . Hematemesis/vomiting blood 04/10/11  . Hypercholesterolemia   . Mild hypertension   . Pancreatitis   . Prostate cancer ([place])   . Prostate cancer [facility])    radiation therapy   . Smoker   . Ulcer   . Upper GI bleed 1980    Past Surgical History:  Procedure Laterality Date  . APPENDECTOMY    . CARDIAC CATHETERIZATION  May 2002 and Feb 2013   [facility]; no stents   . CATARACT EXTRACTION    . CHOLECYSTECTOMY    . CIRCUMCISION    . COLONOSCOPY    . HEMORRHOID SURGERY    . SP CHOLECYSTOMY    . STOMACH SURGERY     bleeding ulcers, followed by Dr. [name]    There were no vitals filed for this visit.  Subjective Assessment - 04/12/18 1035    Subjective  Patient reports he has been completing his stretching and stepping on his step block (20x stepping up and down on 5.5 in block). Patient reports he has been trying to walk more around [employer], but he  feels like he is almost "riding the buggy", and relies on this support, or his legs will get extremely tired.     Pertinent History  Patient is an 85 year old male with current medical status: COPD, prostate cancer (currently on injection therapy PSA trending down), high BP, presenting with concerns of frequent falls. Patient reports he falls "once and a while", and reports he trips over things on the ground and may just "not be picking his feet up". Patient reports he is usually able to catch himself on something, but that he did stumble outside 6 months ago, and "there was nothing to catch" and he fell on the ground. Patient denies hitting his head or injuries (other than brusing) from falls. Patient reports he trips/stumbles" 1x/month or two". Patient denies any pain, visual disturbances, or sensation deficits.  Pt denies N/V, unexplained weight fluctuation, saddle paresthesia, fever, night sweats, or unrelenting night pain at this time; some B&B changes d/t prostate cancer    Limitations  Walking;House hold activities    How long can you sit comfortably?  unlimited    How long can you stand comfortably?  unlimited  How long can you walk comfortably?  unlimited    Diagnostic tests  none to date       Ther-Ex - 6MWT where patient completes 867ft in 45min with multiple standing rest breaks and shuffle gait. Following 02 sats to 79% and is able to return to 92% following pursed lip breathing - Education on pursed lip breathing and energy conservation. PT inquired if patient using O2 at home, which he does not. Patient reports that on his pulse ox at home his oxygen is "90 or above" but when he goes to the [place] it is always lower (in the 80s). PT  - Step ups x10 leading with LLE and x10 leading with RLE with O2 dropping to 86%. Following PT educated on pursed lip breathing throughout exercise and was able to complete 2x 10 each LE with O2 not dropping below 92%  - Sidestepping with red tband 61ft (32ft  down. 24ft back) x3 with HHA for safety and O2 not dropping below 93% with proper breathing - STS 2x 12 with cuing for full hip ext with rise and  eccentric control with controlled lowering inc difficulty with proper form at the end of sets; O2 above 90%                       PT Education - 04/12/18 1039    Education Details  Exercise technique, energy conservation    Person(s) Educated  Patient    Methods  Explanation;Demonstration;Verbal cues    Comprehension  Verbalized understanding;Returned demonstration;Verbal cues required       PT Short Term Goals - 04/05/18 1031      PT SHORT TERM GOAL #1   Title  Pt will be independent with HEP in order to improve strength and balance in order to decrease fall risk and improve function at home and work.    Time  4    Period  Days    Status  New        PT Long Term Goals - 04/05/18 1031      PT LONG TERM GOAL #1   Title   Pt will improve BERG by at least 3 points in order to demonstrate clinically significant improvement in balance.     Baseline  04/05/18 50/56    Time  8    Period  Weeks    Status  New      PT LONG TERM GOAL #2   Title  Pt will improve DGI by at least 3 points in order to demonstrate clinically significant improvement in balance and decreased risk for falls    Baseline  04/05/18 15/24    Time  8    Period  Weeks    Status  New      PT LONG TERM GOAL #3   Title  Pt will increase strength of by at least 1/2 MMT grade of hip abd in order to demonstrate improvement in strength and function.    Baseline  04/05/18 4-/5 bilat    Time  8    Period  Weeks    Status  New      PT LONG TERM GOAL #4   Title  Pt will increase 10MWT by at least 0.13 m/s in order to demonstrate clinically significant improvement in community ambulation.     Baseline  04/05/18 0.74m/s    Time  8    Period  Weeks    Status  New  PT LONG TERM GOAL #5   Title  Patient will demonstrate normalized gait pattern with  stepping over shoebox obstacle    Baseline  04/05/18: decreased gait speed when approaching, decreased hip flex and ankle DF reducing foot clearance and causing LOB    Time  8    Period  Weeks    Status  New            Plan - 04/12/18 1412    Clinical Impression Statement  PT led patient through therex for increased strengthening and endurance for cardiopumonary as well as LEs. Heavy education provided on breathing and enervy conservation as patient has decreased oxygen sat with all activity. Following education patient is able to complete therex without O2 dropping below suggested 88%. PT encouraged patient to continue to utilize breathing techniques at home and monitor oxygen ; patient verbalizes understanding.     Rehab Potential  Good    Clinical Impairments Affecting Rehab Potential  (+) motivation (-) sedentary lifestyle, other comorbidities, age    PT Frequency  1x / week    PT Treatment/Interventions  ADLs/Self Care Home Management;Functional mobility training;Balance training;Patient/family education;Energy conservation;Taping;Passive range of motion;Dry needling;Manual techniques;Neuromuscular re-education;Therapeutic activities;Therapeutic exercise;Gait training;Stair training;DME Instruction;Moist Heat;Electrical Stimulation;Aquatic Therapy    PT Next Visit Plan  gait training, dynamic balance tasks, lateral strenghtening    PT Home Exercise Plan  hamstring stretch    Consulted and Agree with Plan of Care  Patient       Patient will benefit from skilled therapeutic intervention in order to improve the following deficits and impairments:  Abnormal gait, Decreased balance, Decreased endurance, Difficulty walking, Cardiopulmonary status limiting activity, Decreased range of motion, Improper body mechanics, Decreased activity tolerance, Decreased strength, Increased fascial restricitons, Impaired flexibility, Postural dysfunction  Visit Diagnosis: Repeated falls  Difficulty in  walking, not elsewhere classified     Problem List Patient Active Problem List   Diagnosis Date Noted  . Acute cystitis with hematuria 09/14/2017  . BPH (benign prostatic hyperplasia) 02/10/2017  . Left leg pain 10/13/2016  . Falls 08/05/2016  . Chest pain 07/30/2016  . Hip pain, right 02/03/2016  . Actinic keratoses 12/24/2015  . Osteoarthritis of both hands 12/24/2015  . Obesity (BMI 30-39.9) 10/21/2014  . Essential hypertension 07/21/2014  . Murmur, cardiac 10/15/2013  . Chronic low back pain 10/08/2013  . Chronic diastolic CHF (congestive heart failure) ([place]) 04/15/2013  . Thrombocytopenia ([place]) 08/21/2012  . Urge incontinence 07/02/2012  . Hyperlipidemia 06/08/2012  . Balanoposthitis 06/04/2012  . H/O malignant neoplasm of prostate 06/04/2012  . Diabetes ([place]) 04/26/2012  . Iron deficiency anemia 01/06/2012  . Pulmonary hypertension ([place]) 11/25/2011  . GERD (gastroesophageal reflux disease) 06/13/2011  . Coronary artery disease of native artery of native heart with stable angina pectoris ([place]) 04/26/2011  . Tachycardia 04/26/2011  . COPD (chronic obstructive pulmonary disease) ([place])    Shelton Silvas PT, DPT Shelton Silvas 04/12/2018, 2:14 PM  [facility] [address], [place], [address] Phone: [phone]   Fax:  [phone]  Name: Matthew Miles. MRN: [id number] Date of Birth: [birth year]

## 2018-04-17 ENCOUNTER — Encounter: Payer: PPO | Admitting: Physical Therapy

## 2018-04-19 ENCOUNTER — Ambulatory Visit: Payer: PPO | Admitting: Physical Therapy

## 2018-04-19 ENCOUNTER — Encounter: Payer: Self-pay | Admitting: Physical Therapy

## 2018-04-19 DIAGNOSIS — R296 Repeated falls: Secondary | ICD-10-CM

## 2018-04-19 DIAGNOSIS — R262 Difficulty in walking, not elsewhere classified: Secondary | ICD-10-CM

## 2018-04-19 NOTE — Therapy (Signed)
[facility] [address], [place], [address] Phone: [phone]   Fax:  [phone]  Physical Therapy Treatment  Patient Details  Name: Matthew Miles. MRN: [id number] Date of Birth: [birth year] Referring Provider (PT): [name]   Encounter Date: 04/19/2018  PT End of Session - 04/19/18 1123    Visit Number  3    Number of Visits  17    Date for PT Re-Evaluation  05/31/18    PT Start Time  1115    PT Stop Time  1200    PT Time Calculation (min)  45 min    Equipment Utilized During Treatment  Gait belt    Activity Tolerance  Patient tolerated treatment well    Behavior During Therapy  WFL for tasks assessed/performed       Past Medical History:  Diagnosis Date  . Allergy   . Arthritis   . CHF (congestive heart failure) ([place])   . Chicken pox   . Cholecystitis   . Colon polyps   . COPD (chronic obstructive pulmonary disease) ([place])   . Coronary artery disease   . Diabetes mellitus without complication ([place])   . Emphysema of lung ([place])   . GERD (gastroesophageal reflux disease)   . Heart murmur   . Hematemesis/vomiting blood 04/10/11  . Hypercholesterolemia   . Mild hypertension   . Pancreatitis   . Prostate cancer ([place])   . Prostate cancer [facility])    radiation therapy   . Smoker   . Ulcer   . Upper GI bleed 1980    Past Surgical History:  Procedure Laterality Date  . APPENDECTOMY    . CARDIAC CATHETERIZATION  May 2002 and Feb 2013   [facility]; no stents   . CATARACT EXTRACTION    . CHOLECYSTECTOMY    . CIRCUMCISION    . COLONOSCOPY    . HEMORRHOID SURGERY    . SP CHOLECYSTOMY    . STOMACH SURGERY     bleeding ulcers, followed by Dr. [name]    There were no vitals filed for this visit.  Subjective Assessment - 04/19/18 1120    Subjective  Patient reports he has been exercising at home "stepping on his block" and "riding his recumbant bike for 61mins at a time before his legs give out".  Patient reports he monitors his oxygen throughout exercise, and that it "stays in the 90s)    Pertinent History  Patient is an 85 year old male with current medical status: COPD, prostate cancer (currently on injection therapy PSA trending down), high BP, presenting with concerns of frequent falls. Patient reports he falls "once and a while", and reports he trips over things on the ground and may just "not be picking his feet up". Patient reports he is usually able to catch himself on something, but that he did stumble outside 6 months ago, and "there was nothing to catch" and he fell on the ground. Patient denies hitting his head or injuries (other than brusing) from falls. Patient reports he trips/stumbles" 1x/month or two". Patient denies any pain, visual disturbances, or sensation deficits.  Pt denies N/V, unexplained weight fluctuation, saddle paresthesia, fever, night sweats, or unrelenting night pain at this time; some B&B changes d/t prostate cancer    Limitations  Walking;House hold activities    How long can you sit comfortably?  unlimited    How long can you stand comfortably?  unlimited    How long can you walk  comfortably?  unlimited    Diagnostic tests  none to date       Ther-Ex - Nustep L3 30min for increased endurance with resistance; O2 monitored 90% and above - Mini squat at treadmill bar 3x 12 with demo and cuing for proper form with good carry over following - Sidestepping over 6in hurdle 3x 12 (1 rep = over both sides) with CGA for safety and O2 monitored touching down to 86% once, with rapid raise with pursed lip breathing back above 90% - Sidestepping on foam pad 3x 35ft (down and back x3 on 41ft pad) with CGA for safety with occasional minA to prevent LOB and hover UE support over treadmill bar - Ball toss with PT tossing ball outside pts BOS with instruction to reach with b/l to catch ball x12; 2x 12 on airex pad with chair behind patient for safety   Seated rest breaks  between all sets with O2 monitoring throughout session. Patient requiring cuing for proper form/technique with good carry over following. Assistance needed for safety with balance therex to prevent LOB/fallsPT                      PT Education - 04/19/18 1122    Education Details  Exercise form    Person(s) Educated  Patient    Methods  Explanation;Demonstration;Tactile cues;Verbal cues    Comprehension  Returned demonstration;Verbalized understanding;Verbal cues required;Tactile cues required       PT Short Term Goals - 04/05/18 1031      PT SHORT TERM GOAL #1   Title  Pt will be independent with HEP in order to improve strength and balance in order to decrease fall risk and improve function at home and work.    Time  4    Period  Days    Status  New        PT Long Term Goals - 04/05/18 1031      PT LONG TERM GOAL #1   Title   Pt will improve BERG by at least 3 points in order to demonstrate clinically significant improvement in balance.     Baseline  04/05/18 50/56    Time  8    Period  Weeks    Status  New      PT LONG TERM GOAL #2   Title  Pt will improve DGI by at least 3 points in order to demonstrate clinically significant improvement in balance and decreased risk for falls    Baseline  04/05/18 15/24    Time  8    Period  Weeks    Status  New      PT LONG TERM GOAL #3   Title  Pt will increase strength of by at least 1/2 MMT grade of hip abd in order to demonstrate improvement in strength and function.    Baseline  04/05/18 4-/5 bilat    Time  8    Period  Weeks    Status  New      PT LONG TERM GOAL #4   Title  Pt will increase 10MWT by at least 0.13 m/s in order to demonstrate clinically significant improvement in community ambulation.     Baseline  04/05/18 0.38m/s    Time  8    Period  Weeks    Status  New      PT LONG TERM GOAL #5   Title  Patient will demonstrate normalized gait pattern with stepping over shoebox obstacle  Baseline  04/05/18: decreased gait speed when approaching, decreased hip flex and ankle DF reducing foot clearance and causing LOB    Time  8    Period  Weeks    Status  New            Plan - 04/19/18 1129    Clinical Impression Statement  PT continued therex progression for balance and endurance training, which patient is able to complete with accuracy following cuing, and safety with PT assistance. O2 monitoring thorughout session with patient above 90% all but once, with quick return to normal range with pursed lip breathing. Pt will continue to benefit from skilled PT services to ocntinue endurance training and for safety with balance training.     Rehab Potential  Good    Clinical Impairments Affecting Rehab Potential  (+) motivation (-) sedentary lifestyle, other comorbidities, age    PT Frequency  1x / week    PT Duration  8 weeks    PT Treatment/Interventions  ADLs/Self Care Home Management;Functional mobility training;Balance training;Patient/family education;Energy conservation;Taping;Passive range of motion;Dry needling;Manual techniques;Neuromuscular re-education;Therapeutic activities;Therapeutic exercise;Gait training;Stair training;DME Instruction;Moist Heat;Electrical Stimulation;Aquatic Therapy    PT Next Visit Plan  gait training, dynamic balance tasks, lateral strenghtening    PT Home Exercise Plan  hamstring stretch    Consulted and Agree with Plan of Care  Patient       Patient will benefit from skilled therapeutic intervention in order to improve the following deficits and impairments:  Abnormal gait, Decreased balance, Decreased endurance, Difficulty walking, Cardiopulmonary status limiting activity, Decreased range of motion, Improper body mechanics, Decreased activity tolerance, Decreased strength, Increased fascial restricitons, Impaired flexibility, Postural dysfunction  Visit Diagnosis: Repeated falls  Difficulty in walking, not elsewhere  classified     Problem List Patient Active Problem List   Diagnosis Date Noted  . Acute cystitis with hematuria 09/14/2017  . BPH (benign prostatic hyperplasia) 02/10/2017  . Left leg pain 10/13/2016  . Falls 08/05/2016  . Chest pain 07/30/2016  . Hip pain, right 02/03/2016  . Actinic keratoses 12/24/2015  . Osteoarthritis of both hands 12/24/2015  . Obesity (BMI 30-39.9) 10/21/2014  . Essential hypertension 07/21/2014  . Murmur, cardiac 10/15/2013  . Chronic low back pain 10/08/2013  . Chronic diastolic CHF (congestive heart failure) ([place]) 04/15/2013  . Thrombocytopenia ([place]) 08/21/2012  . Urge incontinence 07/02/2012  . Hyperlipidemia 06/08/2012  . Balanoposthitis 06/04/2012  . H/O malignant neoplasm of prostate 06/04/2012  . Diabetes ([place]) 04/26/2012  . Iron deficiency anemia 01/06/2012  . Pulmonary hypertension ([place]) 11/25/2011  . GERD (gastroesophageal reflux disease) 06/13/2011  . Coronary artery disease of native artery of native heart with stable angina pectoris ([place]) 04/26/2011  . Tachycardia 04/26/2011  . COPD (chronic obstructive pulmonary disease) ([place])    Shelton Silvas PT, DPT Shelton Silvas 04/19/2018, 11:54 AM  [facility] [address] [place], [address] Phone: [phone]   Fax:  [phone]  Name: Matthew Miles. MRN: [id number] Date of Birth: 05-31-1928

## 2018-04-24 ENCOUNTER — Encounter: Payer: PPO | Admitting: Physical Therapy

## 2018-04-26 ENCOUNTER — Ambulatory Visit: Payer: PPO | Admitting: Physical Therapy

## 2018-04-26 ENCOUNTER — Encounter: Payer: Self-pay | Admitting: Physical Therapy

## 2018-04-26 DIAGNOSIS — R296 Repeated falls: Secondary | ICD-10-CM | POA: Diagnosis not present

## 2018-04-26 DIAGNOSIS — R262 Difficulty in walking, not elsewhere classified: Secondary | ICD-10-CM

## 2018-04-26 NOTE — Therapy (Signed)
[facility] [address] [place], [address] Phone: [phone]   Fax:  [phone]  Physical Therapy Treatment  Patient Details  Name: Matthew Miles. MRN: [id number] Date of Birth: [birth year] Referring Provider (PT): [name]   Encounter Date: 04/26/2018  PT End of Session - 04/26/18 1126    Visit Number  4    Number of Visits  17    Date for PT Re-Evaluation  05/31/18    PT Start Time  1115    PT Stop Time  1200    PT Time Calculation (min)  45 min    Activity Tolerance  Patient tolerated treatment well    Behavior During Therapy  [facility] for tasks assessed/performed       Past Medical History:  Diagnosis Date  . Allergy   . Arthritis   . CHF (congestive heart failure) ([place])   . Chicken pox   . Cholecystitis   . Colon polyps   . COPD (chronic obstructive pulmonary disease) ([place])   . Coronary artery disease   . Diabetes mellitus without complication ([place])   . Emphysema of lung ([place])   . GERD (gastroesophageal reflux disease)   . Heart murmur   . Hematemesis/vomiting blood 04/10/11  . Hypercholesterolemia   . Mild hypertension   . Pancreatitis   . Prostate cancer ([place])   . Prostate cancer [facility])    radiation therapy   . Smoker   . Ulcer   . Upper GI bleed 1980    Past Surgical History:  Procedure Laterality Date  . APPENDECTOMY    . CARDIAC CATHETERIZATION  May 2002 and Feb 2013   [facility]; no stents   . CATARACT EXTRACTION    . CHOLECYSTECTOMY    . CIRCUMCISION    . COLONOSCOPY    . HEMORRHOID SURGERY    . SP CHOLECYSTOMY    . STOMACH SURGERY     bleeding ulcers, followed by Dr. [name]    There were no vitals filed for this visit.  Subjective Assessment - 04/26/18 1121    Subjective  Patient reports continued exercising at home and no pain. Patient continues to report his O2 stays above 90 with therex at home, but that he is fatigued quickly    Pertinent History  Patient is an  85 year old male with current medical status: COPD, prostate cancer (currently on injection therapy PSA trending down), high BP, presenting with concerns of frequent falls. Patient reports he falls "once and a while", and reports he trips over things on the ground and may just "not be picking his feet up". Patient reports he is usually able to catch himself on something, but that he did stumble outside 6 months ago, and "there was nothing to catch" and he fell on the ground. Patient denies hitting his head or injuries (other than brusing) from falls. Patient reports he trips/stumbles" 1x/month or two". Patient denies any pain, visual disturbances, or sensation deficits.  Pt denies N/V, unexplained weight fluctuation, saddle paresthesia, fever, night sweats, or unrelenting night pain at this time; some B&B changes d/t prostate cancer    Limitations  Walking;House hold activities    How long can you sit comfortably?  unlimited    How long can you stand comfortably?  unlimited    How long can you walk comfortably?  unlimited    Diagnostic tests  none to date         Ther-Ex - Nustep L3  45min for increased endurance with resistance; O2 monitored 90% and above - STS without UE use 3x 12 with noted muscle fatigue over last 2 reps in each set; cuing for full hip and knee ext with good carry over following - LE taps on 6in step with knee flex focus for raise and lowering (cuing to "tap egg, not crush egg") alt LE 3x 8 (each); CGA for safety with cuing to increase hip flex activation with good carry over following - Side stepping 3x 33ft (10 down 10 back) with RTB PT b/l HHA with    Gait Training Trials of walking with head turns vertical and horizontal, with neutral vision and 4 6in obstacle negotiation with patient able to comply with cuing to step over obstacle as opposed to circumduct nearly 100%. Final trials adding head turns with 1 6in obstacle negotiation. PT CGA for safety with patient demonstrating  change in speed and occasional assistance needed to prevent LOB                    PT Education - 04/26/18 1126    Education Details  Exercise technique, safety    Person(s) Educated  Patient    Methods  Explanation;Demonstration;Verbal cues;Tactile cues    Comprehension  Verbalized understanding;Returned demonstration;Verbal cues required;Tactile cues required       PT Short Term Goals - 04/05/18 1031      PT SHORT TERM GOAL #1   Title  Pt will be independent with HEP in order to improve strength and balance in order to decrease fall risk and improve function at home and work.    Time  4    Period  Days    Status  New        PT Long Term Goals - 04/05/18 1031      PT LONG TERM GOAL #1   Title   Pt will improve BERG by at least 3 points in order to demonstrate clinically significant improvement in balance.     Baseline  04/05/18 50/56    Time  8    Period  Weeks    Status  New      PT LONG TERM GOAL #2   Title  Pt will improve DGI by at least 3 points in order to demonstrate clinically significant improvement in balance and decreased risk for falls    Baseline  04/05/18 15/24    Time  8    Period  Weeks    Status  New      PT LONG TERM GOAL #3   Title  Pt will increase strength of by at least 1/2 MMT grade of hip abd in order to demonstrate improvement in strength and function.    Baseline  04/05/18 4-/5 bilat    Time  8    Period  Weeks    Status  New      PT LONG TERM GOAL #4   Title  Pt will increase 10MWT by at least 0.13 m/s in order to demonstrate clinically significant improvement in community ambulation.     Baseline  04/05/18 0.32m/s    Time  8    Period  Weeks    Status  New      PT LONG TERM GOAL #5   Title  Patient will demonstrate normalized gait pattern with stepping over shoebox obstacle    Baseline  04/05/18: decreased gait speed when approaching, decreased hip flex and ankle DF reducing foot clearance and causing LOB  Time  8     Period  Weeks    Status  New            Plan - 04/26/18 1244    Clinical Impression Statement  PT continued therex progression with dynamic balance in gait. Patient is able to complete all therex/gait challenges without increased pain, only noted fatigue, requiring seated breaks between sets. Patient requires assistance for safety with O2 monitoring 92% or higher throughout session.     Clinical Presentation  Evolving    Clinical Decision Making  Moderate    Rehab Potential  Good    Clinical Impairments Affecting Rehab Potential  (+) motivation (-) sedentary lifestyle, other comorbidities, age    PT Frequency  1x / week    PT Duration  8 weeks    PT Treatment/Interventions  ADLs/Self Care Home Management;Functional mobility training;Balance training;Patient/family education;Energy conservation;Taping;Passive range of motion;Dry needling;Manual techniques;Neuromuscular re-education;Therapeutic activities;Therapeutic exercise;Gait training;Stair training;DME Instruction;Moist Heat;Electrical Stimulation;Aquatic Therapy    PT Next Visit Plan  gait training, dynamic balance tasks, lateral strenghtening    PT Home Exercise Plan  hamstring stretch    Consulted and Agree with Plan of Care  Patient       Patient will benefit from skilled therapeutic intervention in order to improve the following deficits and impairments:  Abnormal gait, Decreased balance, Decreased endurance, Difficulty walking, Cardiopulmonary status limiting activity, Decreased range of motion, Improper body mechanics, Decreased activity tolerance, Decreased strength, Increased fascial restricitons, Impaired flexibility, Postural dysfunction  Visit Diagnosis: Repeated falls  Difficulty in walking, not elsewhere classified     Problem List Patient Active Problem List   Diagnosis Date Noted  . Acute cystitis with hematuria 09/14/2017  . BPH (benign prostatic hyperplasia) 02/10/2017  . Left leg pain 10/13/2016  .  Falls 08/05/2016  . Chest pain 07/30/2016  . Hip pain, right 02/03/2016  . Actinic keratoses 12/24/2015  . Osteoarthritis of both hands 12/24/2015  . Obesity (BMI 30-39.9) 10/21/2014  . Essential hypertension 07/21/2014  . Murmur, cardiac 10/15/2013  . Chronic low back pain 10/08/2013  . Chronic diastolic CHF (congestive heart failure) ([place]) 04/15/2013  . Thrombocytopenia ([place]) 08/21/2012  . Urge incontinence 07/02/2012  . Hyperlipidemia 06/08/2012  . Balanoposthitis 06/04/2012  . H/O malignant neoplasm of prostate 06/04/2012  . Diabetes ([place]) 04/26/2012  . Iron deficiency anemia 01/06/2012  . Pulmonary hypertension ([place]) 11/25/2011  . GERD (gastroesophageal reflux disease) 06/13/2011  . Coronary artery disease of native artery of native heart with stable angina pectoris ([place]) 04/26/2011  . Tachycardia 04/26/2011  . COPD (chronic obstructive pulmonary disease) ([place])    Shelton Silvas PT, DPT Shelton Silvas 04/26/2018, 12:52 PM  [facility] [address] [place], [address] Phone: [phone]   Fax:  [phone]  Name: Matthew Miles. MRN: [id number] Date of Birth: 1928-12-05

## 2018-05-01 ENCOUNTER — Encounter: Payer: PPO | Admitting: Physical Therapy

## 2018-05-02 ENCOUNTER — Other Ambulatory Visit: Payer: Self-pay | Admitting: Family Medicine

## 2018-05-03 ENCOUNTER — Ambulatory Visit: Payer: PPO | Admitting: Physical Therapy

## 2018-05-03 ENCOUNTER — Encounter: Payer: Self-pay | Admitting: Physical Therapy

## 2018-05-03 DIAGNOSIS — R296 Repeated falls: Secondary | ICD-10-CM | POA: Diagnosis not present

## 2018-05-03 NOTE — Therapy (Signed)
[facility] [address], [place], [address] Phone: [phone]   Fax:  [phone]  Physical Therapy Treatment  Patient Details  Name: Matthew Miles. MRN: [id number] Date of Birth: 11-Jun-1928 Referring Provider (PT): [name]   Encounter Date: 05/03/2018  PT End of Session - 05/03/18 1121    Visit Number  5    Number of Visits  17    Date for PT Re-Evaluation  05/31/18    PT Start Time  1115    PT Stop Time  1200    PT Time Calculation (min)  45 min    Equipment Utilized During Treatment  Gait belt    Activity Tolerance  Patient tolerated treatment well    Behavior During Therapy  WFL for tasks assessed/performed       Past Medical History:  Diagnosis Date  . Allergy   . Arthritis   . CHF (congestive heart failure) ([place])   . Chicken pox   . Cholecystitis   . Colon polyps   . COPD (chronic obstructive pulmonary disease) ([place])   . Coronary artery disease   . Diabetes mellitus without complication ([place])   . Emphysema of lung ([place])   . GERD (gastroesophageal reflux disease)   . Heart murmur   . Hematemesis/vomiting blood 04/10/11  . Hypercholesterolemia   . Mild hypertension   . Pancreatitis   . Prostate cancer ([place])   . Prostate cancer [facility])    radiation therapy   . Smoker   . Ulcer   . Upper GI bleed 1980    Past Surgical History:  Procedure Laterality Date  . APPENDECTOMY    . CARDIAC CATHETERIZATION  May 2002 and Feb 2013   [facility]; no stents   . CATARACT EXTRACTION    . CHOLECYSTECTOMY    . CIRCUMCISION    . COLONOSCOPY    . HEMORRHOID SURGERY    . SP CHOLECYSTOMY    . STOMACH SURGERY     bleeding ulcers, followed by Dr. [name]    There were no vitals filed for this visit.  Subjective Assessment - 05/03/18 1119    Subjective  Patient reports he has not been feeling good (with an upset stomach over the past couple days, but has been as compliant as possible with HEP.  Patient reports he did walk around [employer] "with a cart" the other day and felt as though he was able to stand with the cart, instead of "prop up on it"    Pertinent History  Patient is an 85 year old male with current medical status: COPD, prostate cancer (currently on injection therapy PSA trending down), high BP, presenting with concerns of frequent falls. Patient reports he falls "once and a while", and reports he trips over things on the ground and may just "not be picking his feet up". Patient reports he is usually able to catch himself on something, but that he did stumble outside 6 months ago, and "there was nothing to catch" and he fell on the ground. Patient denies hitting his head or injuries (other than brusing) from falls. Patient reports he trips/stumbles" 1x/month or two". Patient denies any pain, visual disturbances, or sensation deficits.  Pt denies N/V, unexplained weight fluctuation, saddle paresthesia, fever, night sweats, or unrelenting night pain at this time; some B&B changes d/t prostate cancer    Limitations  Walking;House hold activities    How long can you sit comfortably?  unlimited    How  long can you stand comfortably?  unlimited    How long can you walk comfortably?  unlimited    Diagnostic tests  none to date       Ther-Ex -Nustep L3 80min; L4 85min for increased endurance with resistance; O2 monitored 92% and above - STS without UE use 3x 12 with noted muscle fatigue over last 2 reps in each set; cuing for full hip and knee ext with good carry over following - SLS with HHA tapping on colored balance stones with patient calling out color at random; PT cuing patient to reduce reliance on PT hand grip as able - MATRIX hip abd 25# 3x 10 b/l with cuing for proper posture without lateral lean and cuing for eccentric control with good carry over following - Side stepping over and back 6in obstacle 3x 12 (over and back =1 rep) with CGA for safety and cuing for eccentric control  and full foot clearance with good carry over following                         PT Education - 05/03/18 1121    Education Details  Exercise technique, energy conservation    Person(s) Educated  Patient    Methods  Explanation;Demonstration;Verbal cues    Comprehension  Verbalized understanding;Returned demonstration;Verbal cues required       PT Short Term Goals - 04/05/18 1031      PT SHORT TERM GOAL #1   Title  Pt will be independent with HEP in order to improve strength and balance in order to decrease fall risk and improve function at home and work.    Time  4    Period  Days    Status  New        PT Long Term Goals - 04/05/18 1031      PT LONG TERM GOAL #1   Title   Pt will improve BERG by at least 3 points in order to demonstrate clinically significant improvement in balance.     Baseline  04/05/18 50/56    Time  8    Period  Weeks    Status  New      PT LONG TERM GOAL #2   Title  Pt will improve DGI by at least 3 points in order to demonstrate clinically significant improvement in balance and decreased risk for falls    Baseline  04/05/18 15/24    Time  8    Period  Weeks    Status  New      PT LONG TERM GOAL #3   Title  Pt will increase strength of by at least 1/2 MMT grade of hip abd in order to demonstrate improvement in strength and function.    Baseline  04/05/18 4-/5 bilat    Time  8    Period  Weeks    Status  New      PT LONG TERM GOAL #4   Title  Pt will increase 10MWT by at least 0.13 m/s in order to demonstrate clinically significant improvement in community ambulation.     Baseline  04/05/18 0.81m/s    Time  8    Period  Weeks    Status  New      PT LONG TERM GOAL #5   Title  Patient will demonstrate normalized gait pattern with stepping over shoebox obstacle    Baseline  04/05/18: decreased gait speed when approaching, decreased hip flex and ankle DF  reducing foot clearance and causing LOB    Time  8    Period  Weeks     Status  New            Plan - 05/03/18 1151    Clinical Impression Statement  PT continued therex progression for LE strength and dynamic balance. Patient is able to complete all therex with accuracy following PT cuing, with assistance needed for safety guarding and O2 monitoring. Patient with rest breaks between all therex, but is able to complete all reps following. Patient will continue to benefit from skilled PT to continue to address impairments for best return to safe PLOF.     Clinical Presentation  Evolving    Clinical Decision Making  Moderate    Rehab Potential  Good    Clinical Impairments Affecting Rehab Potential  (+) motivation (-) sedentary lifestyle, other comorbidities, age    PT Frequency  1x / week    PT Duration  8 weeks    PT Treatment/Interventions  ADLs/Self Care Home Management;Functional mobility training;Balance training;Patient/family education;Energy conservation;Taping;Passive range of motion;Dry needling;Manual techniques;Neuromuscular re-education;Therapeutic activities;Therapeutic exercise;Gait training;Stair training;DME Instruction;Moist Heat;Electrical Stimulation;Aquatic Therapy    PT Next Visit Plan  gait training, dynamic balance tasks, lateral strenghtening    PT Home Exercise Plan  hamstring stretch    Consulted and Agree with Plan of Care  Patient       Patient will benefit from skilled therapeutic intervention in order to improve the following deficits and impairments:  Abnormal gait, Decreased balance, Decreased endurance, Difficulty walking, Cardiopulmonary status limiting activity, Decreased range of motion, Improper body mechanics, Decreased activity tolerance, Decreased strength, Increased fascial restricitons, Impaired flexibility, Postural dysfunction  Visit Diagnosis: Repeated falls     Problem List Patient Active Problem List   Diagnosis Date Noted  . Acute cystitis with hematuria 09/14/2017  . BPH (benign prostatic hyperplasia)  02/10/2017  . Left leg pain 10/13/2016  . Falls 08/05/2016  . Chest pain 07/30/2016  . Hip pain, right 02/03/2016  . Actinic keratoses 12/24/2015  . Osteoarthritis of both hands 12/24/2015  . Obesity (BMI 30-39.9) 10/21/2014  . Essential hypertension 07/21/2014  . Murmur, cardiac 10/15/2013  . Chronic low back pain 10/08/2013  . Chronic diastolic CHF (congestive heart failure) ([place]) 04/15/2013  . Thrombocytopenia ([place]) 08/21/2012  . Urge incontinence 07/02/2012  . Hyperlipidemia 06/08/2012  . Balanoposthitis 06/04/2012  . H/O malignant neoplasm of prostate 06/04/2012  . Diabetes ([place]) 04/26/2012  . Iron deficiency anemia 01/06/2012  . Pulmonary hypertension ([place]) 11/25/2011  . GERD (gastroesophageal reflux disease) 06/13/2011  . Coronary artery disease of native artery of native heart with stable angina pectoris ([place]) 04/26/2011  . Tachycardia 04/26/2011  . COPD (chronic obstructive pulmonary disease) ([place])    Shelton Silvas PT, DPT Shelton Silvas 05/03/2018, 11:54 AM  [facility] [address], [place], [address] Phone: [phone]   Fax:  [phone]  Name: Garrett Mitchum. MRN: [id number] Date of Birth: 1928-11-07

## 2018-05-08 ENCOUNTER — Encounter: Payer: PPO | Admitting: Physical Therapy

## 2018-05-09 ENCOUNTER — Telehealth: Payer: Self-pay | Admitting: Family Medicine

## 2018-05-09 NOTE — Telephone Encounter (Signed)
Copied from [place] [phone]. Topic: Quick Communication - See Telephone Encounter >> May 09, 2018  1:15 PM Marin Olp L wrote: CRM for notification. See Telephone encounter for: 05/09/18. Patient daughter [name] says his blood sugar is high around 150, and would like a call back to discuss one of his diabetes medications being discontinued.

## 2018-05-10 ENCOUNTER — Encounter: Payer: Self-pay | Admitting: Physical Therapy

## 2018-05-10 ENCOUNTER — Ambulatory Visit: Payer: PPO | Admitting: Physical Therapy

## 2018-05-10 DIAGNOSIS — R296 Repeated falls: Secondary | ICD-10-CM

## 2018-05-10 NOTE — Telephone Encounter (Signed)
Called and spoke with patient's daughter she seems to be unclear on what her father needs to be taking of not taking. She stated that she did no if her father should be on JANUVIA or not I advised her that Dr. [name] has NOT filled this and this was last filled by Zara Council back in July of 2019.   Pt's daughter stated that he is no longer seeing this doctor and if he should be on this medication then Dr. [name] should be able to fill it since he is his primary doctor.   She requested for an updated medication list to be placed up front for pick up.   This has been placed up front.   She also wanted to know when his next appt's were and looks like her has an appt in March with Denisa for AWV and in April with [facility] for 4 month follow up appt on DM.    Sent to PCP for clarity as to what all DM and BS medication he should be taking and what they should do about his HIGH BS readings?

## 2018-05-10 NOTE — Therapy (Signed)
[facility] [address], [place], [address] Phone: [phone]   Fax:  [phone]  Physical Therapy Treatment  Patient Details  Name: Matthew Miles. MRN: [id number] Date of Birth: 1928-04-18 Referring Provider (PT): [name]   Encounter Date: 05/10/2018  PT End of Session - 05/10/18 1123    Visit Number  6    Number of Visits  17    Date for PT Re-Evaluation  05/31/18    PT Start Time  1115    PT Stop Time  1200    PT Time Calculation (min)  45 min    Equipment Utilized During Treatment  Gait belt    Activity Tolerance  Patient tolerated treatment well    Behavior During Therapy  WFL for tasks assessed/performed       Past Medical History:  Diagnosis Date  . Allergy   . Arthritis   . CHF (congestive heart failure) ([place])   . Chicken pox   . Cholecystitis   . Colon polyps   . COPD (chronic obstructive pulmonary disease) ([place])   . Coronary artery disease   . Diabetes mellitus without complication ([place])   . Emphysema of lung ([place])   . GERD (gastroesophageal reflux disease)   . Heart murmur   . Hematemesis/vomiting blood 04/10/11  . Hypercholesterolemia   . Mild hypertension   . Pancreatitis   . Prostate cancer ([place])   . Prostate cancer [facility])    radiation therapy   . Smoker   . Ulcer   . Upper GI bleed 1980    Past Surgical History:  Procedure Laterality Date  . APPENDECTOMY    . CARDIAC CATHETERIZATION  May 2002 and Feb 2013   [facility]; no stents   . CATARACT EXTRACTION    . CHOLECYSTECTOMY    . CIRCUMCISION    . COLONOSCOPY    . HEMORRHOID SURGERY    . SP CHOLECYSTOMY    . STOMACH SURGERY     bleeding ulcers, followed by Dr. [name]    There were no vitals filed for this visit.  Subjective Assessment - 05/10/18 1122    Subjective  Patient reports no pain or falls since last visit. Patient reports he has been exercising more at home and is a "little sore" today d/t this.     Pertinent History  Patient is an 85 year old male with current medical status: COPD, prostate cancer (currently on injection therapy PSA trending down), high BP, presenting with concerns of frequent falls. Patient reports he falls "once and a while", and reports he trips over things on the ground and may just "not be picking his feet up". Patient reports he is usually able to catch himself on something, but that he did stumble outside 6 months ago, and "there was nothing to catch" and he fell on the ground. Patient denies hitting his head or injuries (other than brusing) from falls. Patient reports he trips/stumbles" 1x/month or two". Patient denies any pain, visual disturbances, or sensation deficits.  Pt denies N/V, unexplained weight fluctuation, saddle paresthesia, fever, night sweats, or unrelenting night pain at this time; some B&B changes d/t prostate cancer    Limitations  Walking;House hold activities    How long can you sit comfortably?  unlimited    How long can you stand comfortably?  unlimited    How long can you walk comfortably?  unlimited         Ther-Ex -Nustep L4 66min for  increased endurance with resistance; O2 monitored 92% and above; cued to maintain SPM above 50  - Standing ball toss with PT tossing ball outside patient's BOS with bilat UE reaching for catch and toss back x15; on airex pad 2x 10 with increased postural sway on airex pad with good use of hip and ankle strategy to maintain balance; chair directly behind patient for safety - Walking with turns handing off ball to PT and receiving (walking with repeated trunk/head turns) demo and max cuing initially for patient to understand exercise, x59ft with patient maintaining forward gaze, 3x 61ft with patient cued to make eye contact with PT to ensure head turn as well with visual disturbance (increasing dynamic balance challenge - Soccer pass with stop to PT with cuing to "look up" when kicking and patient demonstrating good  balance with SLS with opposite LE kicking, with occasional stepping strategy to maintain balance 3x 12 passes - LE alternating taps on 6in step with cuing for "slow speed without crushing an egg on the step with lift to return to standing" to challenge time spent in SLS with opposite LE movement - Biodex limits of stability middle level with demo initially and cuing to change wt shift with hip adjustments instead of moving feet with TC at pelvis; fair carry over. Results: 11% in 1 min 48 sec; 11% in 1 min 48 sec                      PT Education - 05/10/18 1122    Education Details  Exercise technique    Person(s) Educated  Patient    Methods  Explanation;Demonstration;Verbal cues    Comprehension  Verbalized understanding;Returned demonstration;Verbal cues required       PT Short Term Goals - 04/05/18 1031      PT SHORT TERM GOAL #1   Title  Pt will be independent with HEP in order to improve strength and balance in order to decrease fall risk and improve function at home and work.    Time  4    Period  Days    Status  New        PT Long Term Goals - 04/05/18 1031      PT LONG TERM GOAL #1   Title   Pt will improve BERG by at least 3 points in order to demonstrate clinically significant improvement in balance.     Baseline  04/05/18 50/56    Time  8    Period  Weeks    Status  New      PT LONG TERM GOAL #2   Title  Pt will improve DGI by at least 3 points in order to demonstrate clinically significant improvement in balance and decreased risk for falls    Baseline  04/05/18 15/24    Time  8    Period  Weeks    Status  New      PT LONG TERM GOAL #3   Title  Pt will increase strength of by at least 1/2 MMT grade of hip abd in order to demonstrate improvement in strength and function.    Baseline  04/05/18 4-/5 bilat    Time  8    Period  Weeks    Status  New      PT LONG TERM GOAL #4   Title  Pt will increase 10MWT by at least 0.13 m/s in order to  demonstrate clinically significant improvement in community ambulation.  Baseline  04/05/18 0.26m/s    Time  8    Period  Weeks    Status  New      PT LONG TERM GOAL #5   Title  Patient will demonstrate normalized gait pattern with stepping over shoebox obstacle    Baseline  04/05/18: decreased gait speed when approaching, decreased hip flex and ankle DF reducing foot clearance and causing LOB    Time  8    Period  Weeks    Status  New            Plan - 05/10/18 1154    Clinical Impression Statement  PT progressed dynamic balance challenges this session to decrease fall risk, with less strength focus as patient reports soreness and diligence with HEP. Patient requires guarding for safety for most all therex, but demonstrates good ankle/hip/stepping strategies to prevent LOB> PT will continue LE strengthening and dynamic progression as able.     Rehab Potential  Good    Clinical Impairments Affecting Rehab Potential  (+) motivation (-) sedentary lifestyle, other comorbidities, age    PT Frequency  1x / week    PT Treatment/Interventions  ADLs/Self Care Home Management;Functional mobility training;Balance training;Patient/family education;Energy conservation;Taping;Passive range of motion;Dry needling;Manual techniques;Neuromuscular re-education;Therapeutic activities;Therapeutic exercise;Gait training;Stair training;DME Instruction;Moist Heat;Electrical Stimulation;Aquatic Therapy    PT Next Visit Plan  gait training, dynamic balance tasks, lateral strenghtening    PT Home Exercise Plan  hamstring stretch    Consulted and Agree with Plan of Care  Patient       Patient will benefit from skilled therapeutic intervention in order to improve the following deficits and impairments:  Abnormal gait, Decreased balance, Decreased endurance, Difficulty walking, Cardiopulmonary status limiting activity, Decreased range of motion, Improper body mechanics, Decreased activity tolerance, Decreased  strength, Increased fascial restricitons, Impaired flexibility, Postural dysfunction  Visit Diagnosis: Repeated falls     Problem List Patient Active Problem List   Diagnosis Date Noted  . Acute cystitis with hematuria 09/14/2017  . BPH (benign prostatic hyperplasia) 02/10/2017  . Left leg pain 10/13/2016  . Falls 08/05/2016  . Chest pain 07/30/2016  . Hip pain, right 02/03/2016  . Actinic keratoses 12/24/2015  . Osteoarthritis of both hands 12/24/2015  . Obesity (BMI 30-39.9) 10/21/2014  . Essential hypertension 07/21/2014  . Murmur, cardiac 10/15/2013  . Chronic low back pain 10/08/2013  . Chronic diastolic CHF (congestive heart failure) ([place]) 04/15/2013  . Thrombocytopenia ([place]) 08/21/2012  . Urge incontinence 07/02/2012  . Hyperlipidemia 06/08/2012  . Balanoposthitis 06/04/2012  . H/O malignant neoplasm of prostate 06/04/2012  . Diabetes ([place]) 04/26/2012  . Iron deficiency anemia 01/06/2012  . Pulmonary hypertension ([place]) 11/25/2011  . GERD (gastroesophageal reflux disease) 06/13/2011  . Coronary artery disease of native artery of native heart with stable angina pectoris ([place]) 04/26/2011  . Tachycardia 04/26/2011  . COPD (chronic obstructive pulmonary disease) ([place])    Shelton Silvas PT, DPT Shelton Silvas 05/10/2018, 1:12 PM  [facility] [address], [place], [address] Phone: [phone]   Fax:  [phone]  Name: Ethin Drummond. MRN: [id number] Date of Birth: [birth year]

## 2018-05-14 NOTE — Telephone Encounter (Signed)
He should currently be taking glipizide, januvia, jardiance, and metformin for his diabetes.

## 2018-05-15 NOTE — Telephone Encounter (Signed)
Called and spoke to patient's daughter. Daughter advised of what medications he should be on. She stated that he has been without the [facility] and will need this refilled. Sent this to [facility] in [place].   Pt's fasting BS in the AM is 140's to 150's then after eating it is 250's and pt started to feel bad and very tried.   Sent to PCP to advised and approval for refill request.

## 2018-05-16 ENCOUNTER — Encounter: Payer: Self-pay | Admitting: Physical Therapy

## 2018-05-16 ENCOUNTER — Ambulatory Visit: Payer: PPO | Attending: Family Medicine | Admitting: Physical Therapy

## 2018-05-16 DIAGNOSIS — R296 Repeated falls: Secondary | ICD-10-CM | POA: Diagnosis not present

## 2018-05-16 MED ORDER — SITAGLIPTIN PHOSPHATE 50 MG PO TABS: 50.0000 mg | ORAL_TABLET | Freq: Every day | ORAL | 1 refills | Status: DC

## 2018-05-16 NOTE — Telephone Encounter (Signed)
Refill sent to pharmacy.  They should monitor how he is feeling and if he does not start to feel better be evaluated.

## 2018-05-16 NOTE — Therapy (Signed)
[facility] [address] [place], [address] Phone: [phone]   Fax:  [phone]  Physical Therapy Treatment  Patient Details  Name: Matthew Miles. MRN: [id number] Date of Birth: 1928-09-01 Referring Provider (PT): [name]   Encounter Date: 05/16/2018  PT End of Session - 05/16/18 1037    Visit Number  7    Number of Visits  17    Date for PT Re-Evaluation  05/31/18    PT Start Time  1030    PT Stop Time  1115    PT Time Calculation (min)  45 min    Equipment Utilized During Treatment  Gait belt    Activity Tolerance  Patient tolerated treatment well    Behavior During Therapy  WFL for tasks assessed/performed       Past Medical History:  Diagnosis Date  . Allergy   . Arthritis   . CHF (congestive heart failure) ([place])   . Chicken pox   . Cholecystitis   . Colon polyps   . COPD (chronic obstructive pulmonary disease) ([place])   . Coronary artery disease   . Diabetes mellitus without complication ([place])   . Emphysema of lung ([place])   . GERD (gastroesophageal reflux disease)   . Heart murmur   . Hematemesis/vomiting blood 04/10/11  . Hypercholesterolemia   . Mild hypertension   . Pancreatitis   . Prostate cancer ([place])   . Prostate cancer [facility])    radiation therapy   . Smoker   . Ulcer   . Upper GI bleed 1980    Past Surgical History:  Procedure Laterality Date  . APPENDECTOMY    . CARDIAC CATHETERIZATION  May 2002 and Feb 2013   [facility]; no stents   . CATARACT EXTRACTION    . CHOLECYSTECTOMY    . CIRCUMCISION    . COLONOSCOPY    . HEMORRHOID SURGERY    . SP CHOLECYSTOMY    . STOMACH SURGERY     bleeding ulcers, followed by Dr. [name]    There were no vitals filed for this visit.  Subjective Assessment - 05/16/18 1035    Subjective  Patient reports no pain since last visit and no falls. Patient reports compliance with HEP without issues.     Pertinent History  Patient is an 85  year old male with current medical status: COPD, prostate cancer (currently on injection therapy PSA trending down), high BP, presenting with concerns of frequent falls. Patient reports he falls "once and a while", and reports he trips over things on the ground and may just "not be picking his feet up". Patient reports he is usually able to catch himself on something, but that he did stumble outside 6 months ago, and "there was nothing to catch" and he fell on the ground. Patient denies hitting his head or injuries (other than brusing) from falls. Patient reports he trips/stumbles" 1x/month or two". Patient denies any pain, visual disturbances, or sensation deficits.  Pt denies N/V, unexplained weight fluctuation, saddle paresthesia, fever, night sweats, or unrelenting night pain at this time; some B&B changes d/t prostate cancer    Limitations  Walking;House hold activities    How long can you sit comfortably?  unlimited    How long can you stand comfortably?  unlimited    How long can you walk comfortably?  unlimited    Diagnostic tests  none to date         Ther-Ex -Nustep L4 48minfor  increased endurance with resistance; O2 monitored 92% and above; cued to maintain SPM above 50  - STS without UE use with standing balance 3sec x10; standing on airex pad 2x 8 with 3sec standing balance, with increased postural sway and fatigue following - Walking with turns handing off ball to PT and receiving (walking with repeated trunk/head turns),3x 30ft with good carry over from last session, needing min cuing for full head turn  - Walking negotiating 3 6in hurdles 2x 55ft; and 5 6in hurdles 2x 71ft with cuing from PT to maintain speed and CGA for safety  - SLS with opposite LE tap on balance stones called out at random by PT x10 each LE with b.l HHA; x10 each with unilateral HHA; attempted without HHA, only CGA - Biodex limits of stability middle level with good carry over from last session, with some cuing  for hip motion. Results: 21% in 1 min 12 sec; 33% in 56 sec; 30% in 1 min                        PT Education - 05/16/18 1036    Education Details  Exercise form    Person(s) Educated  Patient    Methods  Explanation;Verbal cues;Demonstration    Comprehension  Verbalized understanding;Returned demonstration;Verbal cues required       PT Short Term Goals - 04/05/18 1031      PT SHORT TERM GOAL #1   Title  Pt will be independent with HEP in order to improve strength and balance in order to decrease fall risk and improve function at home and work.    Time  4    Period  Days    Status  New        PT Long Term Goals - 04/05/18 1031      PT LONG TERM GOAL #1   Title   Pt will improve BERG by at least 3 points in order to demonstrate clinically significant improvement in balance.     Baseline  04/05/18 50/56    Time  8    Period  Weeks    Status  New      PT LONG TERM GOAL #2   Title  Pt will improve DGI by at least 3 points in order to demonstrate clinically significant improvement in balance and decreased risk for falls    Baseline  04/05/18 15/24    Time  8    Period  Weeks    Status  New      PT LONG TERM GOAL #3   Title  Pt will increase strength of by at least 1/2 MMT grade of hip abd in order to demonstrate improvement in strength and function.    Baseline  04/05/18 4-/5 bilat    Time  8    Period  Weeks    Status  New      PT LONG TERM GOAL #4   Title  Pt will increase 10MWT by at least 0.13 m/s in order to demonstrate clinically significant improvement in community ambulation.     Baseline  04/05/18 0.64m/s    Time  8    Period  Weeks    Status  New      PT LONG TERM GOAL #5   Title  Patient will demonstrate normalized gait pattern with stepping over shoebox obstacle    Baseline  04/05/18: decreased gait speed when approaching, decreased hip flex and ankle DF reducing foot  clearance and causing LOB    Time  8    Period  Weeks    Status   New            Plan - 05/16/18 1104    Clinical Impression Statement  PT continued dynamic balance progression challenges, which patient is able to complete with improvement, continuing to require assistance for safety. PT will continue progression as progress allows.     Clinical Presentation  Evolving    Clinical Decision Making  Moderate    Rehab Potential  Good    Clinical Impairments Affecting Rehab Potential  (+) motivation (-) sedentary lifestyle, other comorbidities, age    PT Frequency  1x / week    PT Duration  8 weeks    PT Treatment/Interventions  ADLs/Self Care Home Management;Functional mobility training;Balance training;Patient/family education;Energy conservation;Taping;Passive range of motion;Dry needling;Manual techniques;Neuromuscular re-education;Therapeutic activities;Therapeutic exercise;Gait training;Stair training;DME Instruction;Moist Heat;Electrical Stimulation;Aquatic Therapy    PT Next Visit Plan  gait training, dynamic balance tasks, lateral strenghtening    PT Home Exercise Plan  hamstring stretch    Consulted and Agree with Plan of Care  Patient       Patient will benefit from skilled therapeutic intervention in order to improve the following deficits and impairments:  Abnormal gait, Decreased balance, Decreased endurance, Difficulty walking, Cardiopulmonary status limiting activity, Decreased range of motion, Improper body mechanics, Decreased activity tolerance, Decreased strength, Increased fascial restricitons, Impaired flexibility, Postural dysfunction  Visit Diagnosis: Repeated falls     Problem List Patient Active Problem List   Diagnosis Date Noted  . Acute cystitis with hematuria 09/14/2017  . BPH (benign prostatic hyperplasia) 02/10/2017  . Left leg pain 10/13/2016  . Falls 08/05/2016  . Chest pain 07/30/2016  . Hip pain, right 02/03/2016  . Actinic keratoses 12/24/2015  . Osteoarthritis of both hands 12/24/2015  . Obesity (BMI  30-39.9) 10/21/2014  . Essential hypertension 07/21/2014  . Murmur, cardiac 10/15/2013  . Chronic low back pain 10/08/2013  . Chronic diastolic CHF (congestive heart failure) ([place]) 04/15/2013  . Thrombocytopenia ([place]) 08/21/2012  . Urge incontinence 07/02/2012  . Hyperlipidemia 06/08/2012  . Balanoposthitis 06/04/2012  . H/O malignant neoplasm of prostate 06/04/2012  . Diabetes ([place]) 04/26/2012  . Iron deficiency anemia 01/06/2012  . Pulmonary hypertension ([place]) 11/25/2011  . GERD (gastroesophageal reflux disease) 06/13/2011  . Coronary artery disease of native artery of native heart with stable angina pectoris ([place]) 04/26/2011  . Tachycardia 04/26/2011  . COPD (chronic obstructive pulmonary disease) ([place])    Shelton Silvas PT, DPT  Shelton Silvas 05/16/2018, 11:13 AM  [facility] [address] [place], [address] Phone: [phone]   Fax:  [phone]  Name: Kenichi Cassada. MRN: [id number] Date of Birth: 23-Feb-1929

## 2018-05-17 ENCOUNTER — Other Ambulatory Visit: Payer: Self-pay | Admitting: Family Medicine

## 2018-05-23 ENCOUNTER — Ambulatory Visit: Payer: PPO | Admitting: Physical Therapy

## 2018-05-23 ENCOUNTER — Encounter: Payer: Self-pay | Admitting: Physical Therapy

## 2018-05-23 DIAGNOSIS — R296 Repeated falls: Secondary | ICD-10-CM | POA: Diagnosis not present

## 2018-05-23 NOTE — Therapy (Signed)
[facility] [address], [place], [address] Phone: [phone]   Fax:  [phone]  Physical Therapy Treatment/DC Summary  Patient Details  Name: Matthew Miles. MRN: [id number] Date of Birth: [birth year] Referring Provider (PT): [name]   Encounter Date: 05/23/2018  PT End of Session - 05/23/18 1036    Visit Number  8    Number of Visits  17    Date for PT Re-Evaluation  05/31/18    PT Start Time  1030    PT Stop Time  1115    PT Time Calculation (min)  45 min    Equipment Utilized During Treatment  Gait belt    Activity Tolerance  Patient tolerated treatment well    Behavior During Therapy  WFL for tasks assessed/performed       Past Medical History:  Diagnosis Date  . Allergy   . Arthritis   . CHF (congestive heart failure) ([place])   . Chicken pox   . Cholecystitis   . Colon polyps   . COPD (chronic obstructive pulmonary disease) ([place])   . Coronary artery disease   . Diabetes mellitus without complication ([place])   . Emphysema of lung ([place])   . GERD (gastroesophageal reflux disease)   . Heart murmur   . Hematemesis/vomiting blood 04/10/11  . Hypercholesterolemia   . Mild hypertension   . Pancreatitis   . Prostate cancer ([place])   . Prostate cancer [facility])    radiation therapy   . Smoker   . Ulcer   . Upper GI bleed 1980    Past Surgical History:  Procedure Laterality Date  . APPENDECTOMY    . CARDIAC CATHETERIZATION  May 2002 and Feb 2013   [facility]; no stents   . CATARACT EXTRACTION    . CHOLECYSTECTOMY    . CIRCUMCISION    . COLONOSCOPY    . HEMORRHOID SURGERY    . SP CHOLECYSTOMY    . STOMACH SURGERY     bleeding ulcers, followed by Dr. [name]    There were no vitals filed for this visit.  Subjective Assessment - 05/23/18 1033    Subjective  Patient reports no pain or falls. Patient reports that he walked around [place] and did not feel as though he had to lean on the cart as  much, which he is happy with. Said he walke around an hour/hour and a half and felt good.     Pertinent History  Patient is an 85 year old male with current medical status: COPD, prostate cancer (currently on injection therapy PSA trending down), high BP, presenting with concerns of frequent falls. Patient reports he falls "once and a while", and reports he trips over things on the ground and may just "not be picking his feet up". Patient reports he is usually able to catch himself on something, but that he did stumble outside 6 months ago, and "there was nothing to catch" and he fell on the ground. Patient denies hitting his head or injuries (other than brusing) from falls. Patient reports he trips/stumbles" 1x/month or two". Patient denies any pain, visual disturbances, or sensation deficits.  Pt denies N/V, unexplained weight fluctuation, saddle paresthesia, fever, night sweats, or unrelenting night pain at this time; some B&B changes d/t prostate cancer    Limitations  Walking;House hold activities    How long can you sit comfortably?  unlimited    How long can you stand comfortably?  unlimited  How long can you walk comfortably?  unlimited       Ther-Ex -Nustep L86mn; L5 224mfor increased endurance with resistance; O2 monitored 96% and above; cued to maintain SPM above 60  - DGI and BERG balance assessments with PT with supervision for safety. PT reviewed results with patient and explained progress and this impact on decreasing fall risk - Strength assessment - Review of the following HEP and education on strengthening parameters and safety considerations. Encouraged patient to continue HEP and recumbent biking Forward Step Touch - 10 reps - 3 sets - 1x daily - 4x weekly  Side Stepping with Resistance at Ankles and Counter Support - 10 reps - 3 sets - 1x daily - 4x weekly  Bridge - 10 reps - 3 sets - 1x daily - 4x weekly  Squat with Counter Support - 10 reps - 3 sets - 1x daily - 4x  weekly Standing Single Leg Stance with Counter Support - 30sec hold - 1x daily - 7x weekly                        PT Education - 05/23/18 1035    Education Details  Exercise form; DC recommendation    Person(s) Educated  Patient    Methods  Explanation;Demonstration    Comprehension  Verbalized understanding;Returned demonstration       PT Short Term Goals - 04/05/18 1031      PT SHORT TERM GOAL #1   Title  Pt will be independent with HEP in order to improve strength and balance in order to decrease fall risk and improve function at home and work.    Time  4    Period  Days    Status  New        PT Long Term Goals - 05/23/18 1037      PT LONG TERM GOAL #1   Title   Pt will improve BERG by at least 3 points in order to demonstrate clinically significant improvement in balance.     Baseline  05/23/18 54/56    Time  8    Period  Weeks    Status  Achieved      PT LONG TERM GOAL #2   Title  Pt will improve DGI by at least 3 points in order to demonstrate clinically significant improvement in balance and decreased risk for falls    Baseline  05/23/18 22/24    Time  8    Period  Weeks    Status  Achieved      PT LONG TERM GOAL #3   Title  Pt will increase strength of by at least 1/2 MMT grade of hip abd in order to demonstrate improvement in strength and function.    Baseline  04/05/18 4+/5 bilat    Time  8    Period  Weeks    Status  Achieved      PT LONG TERM GOAL #4   Title  Pt will increase 10MWT by at least 0.13 m/s in order to demonstrate clinically significant improvement in community ambulation.     Baseline  05/23/18 1.08    Time  8    Period  Weeks    Status  Partially Met      PT LONG TERM GOAL #5   Title  Patient will demonstrate normalized gait pattern with stepping over shoebox obstacle    Baseline  05/23/18 no abnormalities in gait with obstacle negotation  Time  8    Period  Weeks    Status  Achieved            Plan -  05/23/18 1233    Clinical Impression Statement  Patient has met all PT goals at this time to safely DC PT. Patient has increased his dynamic balance and strength to decrease fall risk, and is in "low fall risk" category of all objective measures. PT reviewed HEP with patient to ensure maintainence of PT progress, which patient verbalizes understanding of. Patient given clinic contact info should any further questions/concerns arise.     Rehab Potential  Good    Clinical Impairments Affecting Rehab Potential  (+) motivation (-) sedentary lifestyle, other comorbidities, age    PT Frequency  1x / week    PT Duration  8 weeks    PT Treatment/Interventions  ADLs/Self Care Home Management;Functional mobility training;Balance training;Patient/family education;Energy conservation;Taping;Passive range of motion;Dry needling;Manual techniques;Neuromuscular re-education;Therapeutic activities;Therapeutic exercise;Gait training;Stair training;DME Instruction;Moist Heat;Electrical Stimulation;Aquatic Therapy    PT Next Visit Plan  gait training, dynamic balance tasks, lateral strenghtening    PT Home Exercise Plan  hamstring stretch    Consulted and Agree with Plan of Care  Patient       Patient will benefit from skilled therapeutic intervention in order to improve the following deficits and impairments:  Abnormal gait, Decreased balance, Decreased endurance, Difficulty walking, Cardiopulmonary status limiting activity, Decreased range of motion, Improper body mechanics, Decreased activity tolerance, Decreased strength, Increased fascial restricitons, Impaired flexibility, Postural dysfunction  Visit Diagnosis: Repeated falls     Problem List Patient Active Problem List   Diagnosis Date Noted  . Acute cystitis with hematuria 09/14/2017  . BPH (benign prostatic hyperplasia) 02/10/2017  . Left leg pain 10/13/2016  . Falls 08/05/2016  . Chest pain 07/30/2016  . Hip pain, right 02/03/2016  . Actinic  keratoses 12/24/2015  . Osteoarthritis of both hands 12/24/2015  . Obesity (BMI 30-39.9) 10/21/2014  . Essential hypertension 07/21/2014  . Murmur, cardiac 10/15/2013  . Chronic low back pain 10/08/2013  . Chronic diastolic CHF (congestive heart failure) ([place]) 04/15/2013  . Thrombocytopenia ([place]) 08/21/2012  . Urge incontinence 07/02/2012  . Hyperlipidemia 06/08/2012  . Balanoposthitis 06/04/2012  . H/O malignant neoplasm of prostate 06/04/2012  . Diabetes ([place]) 04/26/2012  . Iron deficiency anemia 01/06/2012  . Pulmonary hypertension ([place]) 11/25/2011  . GERD (gastroesophageal reflux disease) 06/13/2011  . Coronary artery disease of native artery of native heart with stable angina pectoris ([place]) 04/26/2011  . Tachycardia 04/26/2011  . COPD (chronic obstructive pulmonary disease) ([place])    Shelton Silvas PT, DPT Shelton Silvas 05/23/2018, 12:51 PM  [facility] [address] [place], [address] Phone: [phone]   Fax:  [phone]  Name: Antoin Dargis. MRN: [id number] Date of Birth: Jan 03, 1929

## 2018-05-30 ENCOUNTER — Other Ambulatory Visit: Payer: Self-pay | Admitting: Family Medicine

## 2018-05-30 ENCOUNTER — Ambulatory Visit: Payer: PPO | Admitting: Physical Therapy

## 2018-06-15 ENCOUNTER — Ambulatory Visit: Payer: PPO | Admitting: Urology

## 2018-06-25 ENCOUNTER — Other Ambulatory Visit: Payer: Self-pay

## 2018-06-25 ENCOUNTER — Ambulatory Visit (INDEPENDENT_AMBULATORY_CARE_PROVIDER_SITE_OTHER): Payer: PPO

## 2018-06-25 VITALS — BP 126/64 | HR 71 | Temp 97.8°F | Resp 16 | Ht 63.75 in | Wt 185.1 lb

## 2018-06-25 DIAGNOSIS — Z0000 Encounter for general adult medical examination without abnormal findings: Secondary | ICD-10-CM | POA: Diagnosis not present

## 2018-06-25 NOTE — Patient Instructions (Addendum)
  Matthew Miles , Thank you for taking time to come for your Medicare Wellness Visit. I appreciate your ongoing commitment to your health goals. Please review the following plan we discussed and let me know if I can assist you in the future.   These are the goals we discussed: Goals    . Healthy Lifestyle     Increase bicycling as tolerated Manage blood sugar Healthy diet Stay hydrated       This is a list of the screening recommended for you and due dates:  Health Maintenance  Topic Date Due  . Urine Protein Check  02/17/2018  . Eye exam for diabetics  04/12/2018  . Complete foot exam   09/15/2018  . Hemoglobin A1C  09/15/2018  . Tetanus Vaccine  04/06/2021  . Flu Shot  Completed  . Pneumonia vaccines  Completed

## 2018-06-25 NOTE — Progress Notes (Signed)
Subjective:   Vontae Court. is a 85 y.o. male who presents for Medicare Annual/Subsequent preventive examination.  Review of Systems:  No ROS.  Medicare Wellness Visit. Additional risk factors are reflected in the social history.  Cardiac Risk Factors include: advanced age (>55mn, >>18women);hypertension;diabetes mellitus;male gender     Objective:    Vitals: BP 126/64 (BP Location: Left Arm, Patient Position: Sitting, Cuff Size: Normal)   Pulse 71   Temp 97.8 F (36.6 C) (Oral)   Resp 16   Ht 5' 3.75" (1.619 m)   Wt 185 lb 1.9 oz (84 kg)   SpO2 95%   BMI 32.03 kg/m   Body mass index is 32.03 kg/m.  Advanced Directives 06/25/2018 04/05/2018 02/21/2018 08/22/2017 06/23/2017 02/21/2017 08/23/2016  Does Patient Have a Medical Advance Directive? Yes Yes Yes No;Yes Yes No No  Type of AParamedicof ABowersLiving will HHarlanLiving will HHubbardLiving will [place]Living will [place]- -  Does patient want to make changes to medical advance directive? No - Patient declined Yes (Inpatient - patient requests chaplain consult to change a medical advance directive) - - No - Patient declined - -  Copy of [place] Chart? No - copy requested No - copy requested - No - copy requested No - copy requested - -  Would patient like information on creating a medical advance directive? - - - - - - No - Patient declined    Tobacco Social History   Tobacco Use  Smoking Status Former Smoker  . Packs/day: 1.00  . Years: 65.00  . Pack years: 65.00  . Types: Cigarettes  . Last attempt to quit: 04/10/2011  . Years since quitting: 7.2  Smokeless Tobacco Former USystems developer . Types: Chew     Counseling given: Not Answered   Clinical Intake:  Pre-visit preparation completed: Yes        Diabetes: Yes(Followed by pcp)  How often do you need to have someone help  you when you read instructions, pamphlets, or other written materials from your doctor or pharmacy?: 1 - Never  Interpreter Needed?: No     Past Medical History:  Diagnosis Date  . Allergy   . Arthritis   . CHF (congestive heart failure) ([place]   . Chicken pox   . Cholecystitis   . Colon polyps   . COPD (chronic obstructive pulmonary disease) ([place]   . Coronary artery disease   . Diabetes mellitus without complication ([place]   . Emphysema of lung ([place]   . GERD (gastroesophageal reflux disease)   . Heart murmur   . Hematemesis/vomiting blood 04/10/11  . Hypercholesterolemia   . Mild hypertension   . Pancreatitis   . Prostate cancer ([place]   . Prostate cancer ([facility]    radiation therapy   . Smoker   . Ulcer   . Upper GI bleed 1980   Past Surgical History:  Procedure Laterality Date  . APPENDECTOMY    . [facility]  [facility]   . CATARACT EXTRACTION    . CHOLECYSTECTOMY    . CIRCUMCISION    . COLONOSCOPY    . HEMORRHOID SURGERY    . SP CHOLECYSTOMY    . STOMACH SURGERY     bleeding ulcers, followed by Dr. ETiffany Kocher  Family History  Problem Relation Age of Onset  . Prostate cancer Father   .  Liver cancer Brother   . Prostate cancer Brother   . Emphysema Sister        smoker   Social History   Socioeconomic History  . Marital status: Widowed    Spouse name: Not on file  . Number of children: 2  . Years of education: Not on file  . Highest education level: Not on file  Occupational History  . 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 Financial resource strain: Not hard at all  . Food insecurity:    Worry: Never true    Inability: Never true  . Transportation needs:    Medical: No    Non-medical: No  Tobacco Use  . Smoking status: Former Smoker    Packs/day: 1.00    Years: 65.00    Pack years: 65.00    Types: Cigarettes    Last attempt to quit: 04/10/2011    Years  since quitting: 7.2  . Smokeless tobacco: Former Systems developer    Types: Chew  Substance and Sexual Activity  . Alcohol use: Yes    Comment: 1 beer rarely  . Drug use: No  . Sexual activity: Not Currently  Lifestyle  . Physical activity:    Days per week: 1 day    Minutes per session: 10 min  . Stress: Not at all  Relationships  . Social connections:    Talks on phone: Not on file    Gets together: Not on file    Attends religious service: Not on file    Active member of club or organization: Not on file    Attends meetings of clubs or organizations: Not on file    Relationship status: Not on file  Other Topics Concern  . Not on file  Social History Narrative   Lives in [place] alone. Wife in nursing home. Has 2 children.      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 alfuzosin (UROXATRAL) 10 MG 24 hr tablet Take 1 tablet (10 mg total) by mouth daily with breakfast.  . aspirin 81 MG tablet Take 81 mg by mouth daily.   . calcium-vitamin D 250-100 MG-UNIT tablet Take 1 tablet by mouth 2 (two) times daily.  . carvedilol (COREG) 6.25 MG tablet TAKE 1 TABLET(6.25 MG) BY MOUTH TWICE DAILY  . Cyanocobalamin (B-12) 1000 MCG TBCR Take 1 tablet by mouth daily.   Marland Kitchen glipiZIDE (GLUCOTROL XL) 10 MG 24 hr tablet TAKE 1 TABLET(10 MG) BY MOUTH TWICE DAILY  . JARDIANCE 10 MG TABS tablet TAKE 1 TABLET BY MOUTH DAILY  . Leuprolide Acetate, 6 Month, (LUPRON) 45 MG injection Inject into the muscle.  . metFORMIN (GLUCOPHAGE-XR) 500 MG 24 hr tablet TAKE 2 TABLETS(1000 MG) BY MOUTH TWICE DAILY  . nitroGLYCERIN (NITROSTAT) 0.4 MG SL tablet Place 1 tablet (0.4 mg total) under the tongue every 5 (five) minutes as needed.  Glory Rosebush VERIO test strip USE AS DIRECTED TO CHECK BLOOD SUGAR TWICE DAILY  . pantoprazole (PROTONIX) 20 MG tablet TAKE 1 TABLET(20 MG) BY MOUTH DAILY  . potassium chloride SA  (K-DUR,KLOR-CON) 20 MEQ tablet TAKE 1 TABLET(20 MEQ) BY MOUTH DAILY  . pravastatin (PRAVACHOL) 40 MG tablet TAKE 1 TABLET(40 MG) BY MOUTH DAILY  . PROAIR HFA 108 (90 Base) MCG/ACT inhaler INHALE 2 PUFFS INTO  THE LUNGS EVERY 6 HOURS AS NEEDED FOR WHEEZING OR SHORTNESS OF BREATH  . sitaGLIPtin (JANUVIA) 50 MG tablet Take 1 tablet (50 mg total) by mouth daily.  Marland Kitchen SPIRIVA RESPIMAT 2.5 MCG/ACT AERS INHALE 2 PUFFS INTO THE LUNGS DAILY  . torsemide (DEMADEX) 10 MG tablet TAKE 1 TABLET(10 MG) BY MOUTH TWICE DAILY AS NEEDED  . [DISCONTINUED] Aspirin-Calcium Carbonate 81-777 MG TABS Take by mouth.  . [DISCONTINUED] Blood Glucose Monitoring Suppl (FIFTY50 GLUCOSE METER 2.0) w/Device KIT Use as directed  . [DISCONTINUED] nystatin cream (MYCOSTATIN) Apply 1 application topically 2 (two) times daily.  . [DISCONTINUED] omeprazole (PRILOSEC) 20 MG capsule Take by mouth.   No facility-administered encounter medications on file as of 06/25/2018.     Activities of Daily Living In your present state of health, do you have any difficulty performing the following activities: 06/25/2018  Hearing? Y  Comment Hearing aid R ear  Vision? N  Difficulty concentrating or making decisions? N  Walking or climbing stairs? N  Dressing or bathing? N  Doing errands, shopping? N  Preparing Food and eating ? N  Using the Toilet? N  In the past six months, have you accidently leaked urine? N  Do you have problems with loss of bowel control? N  Managing your Medications? N  Managing your Finances? N  Housekeeping or managing your Housekeeping? N  Some recent data might be hidden    Patient Care Team: [name], MD as PCP - General (Family Medicine) Minna Merritts, MD as Consulting Physician (Cardiology)   Assessment:   This is a routine wellness examination for Kit.  Diabetes followed by pcp. Reports fasting BS average 125.  Health Screenings  Colonoscopy -12/2706 PSA -12/04/17 (0.5) Glaucoma -none  Hearing  -hearing aid Hemoglobin A1C -03/16/18 (7.3) Cholesterol -03/16/18 (104) Vision-05/08/17 Dental-dentures  Social  Alcohol intake -yes Smoking history- former Smokers in home? none Illicit drug use? none Exercise -bicycling Diet -regular Sexually Active -not currently  Safety  Patient feels safe at home.  Patient does have smoke detectors at home  Patient does wear sunscreen or protective clothing when in direct sunlight  Patient does wear seat belt when driving or riding with others.   Activities of Daily Living Patient can do their own household chores. Denies needing assistance with: driving, feeding themselves, getting from bed to chair, getting to the toilet, bathing/showering, dressing, managing money, climbing flight of stairs, or preparing meals. Cane in use when ambulating.  Depression Screen Patient denies losing interest in daily life, feeling hopeless, or crying easily over simple problems.   Fall Screen Patient denies being afraid of falling or falling in the last year.   Memory Screen Patient denies problems with memory, misplacing items, and is able to balance checkbook/bank accounts.  Patient is alert, normal appearance, oriented to person/place/and time. Correctly identified the president of the [place], recall of 2/3 objects, and performing simple calculations.  Patient displays appropriate judgement and can read correct time from watch face.   Immunizations The following Immunizations are up to date: Influenza, pneumonia, and tetanus. Shingles discussed.  Other Providers Patient Care Team: [name], MD as PCP - General (Family Medicine) Minna Merritts, MD as Consulting Physician (Cardiology)  Exercise Activities and Dietary recommendations Current Exercise Habits: Home exercise routine, Type of exercise: walking, Intensity: Mild  Goals    . Healthy Lifestyle     Increase bicycling as tolerated Manage blood sugar Healthy diet Stay  hydrated  Fall Risk Fall Risk  06/25/2018 06/23/2017 06/22/2016 06/23/2015 04/21/2014  Falls in the past year? 0 No No No No  Number falls in past yr: - - - - -  Injury with Fall? - - - - -   Depression Screen PHQ 2/9 Scores 06/25/2018 06/23/2017 06/22/2016 06/23/2015  PHQ - 2 Score 0 0 0 0    Cognitive Function MMSE - Mini Mental State Exam 06/22/2016 06/23/2015  Orientation to time 5 5  Orientation to Place 5 5  Registration 3 3  Attention/ Calculation 5 5  Recall 3 3  Language- name 2 objects 2 2  Language- repeat 1 1  Language- follow 3 step command 3 3  Language- read & follow direction 1 1  Write a sentence 1 1  Copy design 1 1  Total score 30 30     6CIT Screen 06/25/2018 06/23/2017  What Year? 0 points 0 points  What month? 0 points 0 points  What time? 0 points 0 points  Count back from 20 0 points 0 points  Months in reverse 0 points 0 points  Repeat phrase 0 points 0 points  Total Score 0 0    Immunization History  Administered Date(s) Administered  . Influenza Split 12/29/2011  . Influenza, High Dose Seasonal PF 12/24/2015, 01/14/2017, 01/31/2018  . Influenza,inj,Quad PF,6+ Mos 01/10/2013, 01/17/2014, 01/22/2015  . Influenza-Unspecified 12/20/2011  . Pneumococcal Conjugate-13 04/21/2014  . Pneumococcal Polysaccharide-23 12/29/2011  . Tdap 04/07/2011   Screening Tests Health Maintenance  Topic Date Due  . URINE MICROALBUMIN  02/17/2018  . OPHTHALMOLOGY EXAM  04/12/2018  . FOOT EXAM  09/15/2018  . HEMOGLOBIN A1C  09/15/2018  . TETANUS/TDAP  04/06/2021  . INFLUENZA VACCINE  Completed  . PNA vac Low Risk Adult  Completed      Plan:    End of life planning; Advance aging; Advanced directives discussed. Copy of current HCPOA/Living Will requested.    I have personally reviewed and noted the following in the patient's chart:   . Medical and social history . Use of alcohol, tobacco or illicit drugs  . Current medications and supplements .  Functional ability and status . Nutritional status . Physical activity . Advanced directives . List of other physicians . Hospitalizations, surgeries, and ER visits in previous 12 months . Vitals . Screenings to include cognitive, depression, and falls . Referrals and appointments  In addition, I have reviewed and discussed with patient certain preventive protocols, quality metrics, and best practice recommendations. A written personalized care plan for preventive services as well as general preventive health recommendations were provided to patient.     Varney Biles, LPN  0/93/2355  Agree with plan in pcp's absence.  Mable Paris, NP

## 2018-06-29 ENCOUNTER — Other Ambulatory Visit: Payer: Self-pay | Admitting: Family Medicine

## 2018-06-29 NOTE — Telephone Encounter (Signed)
Last OV 03/16/2018   Next OV 07/18/2018  Sent to PCP for approval

## 2018-06-29 NOTE — Telephone Encounter (Signed)
Refills reviewed.  Jardiance declined.  On review of most recent kidney function his levels were lower than the cut off that is allowed for Jardiance use.  He should not continue on this medication at this time and we can plan on rechecking his kidney function when he follows up in April.

## 2018-07-04 DIAGNOSIS — R61 Generalized hyperhidrosis: Secondary | ICD-10-CM | POA: Diagnosis not present

## 2018-07-04 DIAGNOSIS — C61 Malignant neoplasm of prostate: Secondary | ICD-10-CM | POA: Diagnosis not present

## 2018-07-04 DIAGNOSIS — D696 Thrombocytopenia, unspecified: Secondary | ICD-10-CM | POA: Diagnosis not present

## 2018-07-04 DIAGNOSIS — R590 Localized enlarged lymph nodes: Secondary | ICD-10-CM | POA: Diagnosis not present

## 2018-07-04 DIAGNOSIS — R161 Splenomegaly, not elsewhere classified: Secondary | ICD-10-CM | POA: Diagnosis not present

## 2018-07-04 DIAGNOSIS — Z79818 Long term (current) use of other agents affecting estrogen receptors and estrogen levels: Secondary | ICD-10-CM | POA: Diagnosis not present

## 2018-07-04 DIAGNOSIS — D649 Anemia, unspecified: Secondary | ICD-10-CM | POA: Diagnosis not present

## 2018-07-04 DIAGNOSIS — Z87891 Personal history of nicotine dependence: Secondary | ICD-10-CM | POA: Diagnosis not present

## 2018-07-04 DIAGNOSIS — R972 Elevated prostate specific antigen [PSA]: Secondary | ICD-10-CM | POA: Diagnosis not present

## 2018-07-04 DIAGNOSIS — D72820 Lymphocytosis (symptomatic): Secondary | ICD-10-CM | POA: Diagnosis not present

## 2018-07-04 DIAGNOSIS — Z923 Personal history of irradiation: Secondary | ICD-10-CM | POA: Diagnosis not present

## 2018-07-10 ENCOUNTER — Other Ambulatory Visit: Payer: Self-pay | Admitting: Family Medicine

## 2018-07-10 DIAGNOSIS — E1165 Type 2 diabetes mellitus with hyperglycemia: Secondary | ICD-10-CM

## 2018-07-17 ENCOUNTER — Other Ambulatory Visit: Payer: Self-pay

## 2018-07-17 MED ORDER — EMPAGLIFLOZIN 10 MG PO TABS: 10.0000 mg | ORAL_TABLET | Freq: Every day | ORAL | 2 refills | Status: DC

## 2018-07-18 ENCOUNTER — Ambulatory Visit (INDEPENDENT_AMBULATORY_CARE_PROVIDER_SITE_OTHER): Payer: PPO | Admitting: Family Medicine

## 2018-07-18 ENCOUNTER — Encounter: Payer: Self-pay | Admitting: Family Medicine

## 2018-07-18 ENCOUNTER — Other Ambulatory Visit: Payer: Self-pay

## 2018-07-18 ENCOUNTER — Telehealth: Payer: Self-pay | Admitting: Family Medicine

## 2018-07-18 DIAGNOSIS — D72820 Lymphocytosis (symptomatic): Secondary | ICD-10-CM | POA: Insufficient documentation

## 2018-07-18 DIAGNOSIS — K9049 Malabsorption due to intolerance, not elsewhere classified: Secondary | ICD-10-CM | POA: Insufficient documentation

## 2018-07-18 DIAGNOSIS — Z8546 Personal history of malignant neoplasm of prostate: Secondary | ICD-10-CM

## 2018-07-18 DIAGNOSIS — I10 Essential (primary) hypertension: Secondary | ICD-10-CM | POA: Diagnosis not present

## 2018-07-18 DIAGNOSIS — E119 Type 2 diabetes mellitus without complications: Secondary | ICD-10-CM

## 2018-07-18 NOTE — Telephone Encounter (Signed)
Called and spoke with pt's daughter and he has been scheduled for an appt in July.

## 2018-07-18 NOTE — Assessment & Plan Note (Signed)
Diarrhea appears to be related to milk intake.  Discussed avoiding regular milk.  Discussed he could attempt Lactaid milk if he would prefer cows milk.  If he has persistent issues with this he will let us know.

## 2018-07-18 NOTE — Assessment & Plan Note (Signed)
Obstructive symptoms have improved.  He will continue to follow with urology.

## 2018-07-18 NOTE — Telephone Encounter (Signed)
She will inform her father.

## 2018-07-18 NOTE — Progress Notes (Signed)
Virtual Visit via Telephone Note  This visit type was conducted due to national recommendations for restrictions regarding the COVID-19 pandemic (e.g. social distancing).  This format is felt to be most appropriate for this patient at this time.  All issues noted in this document were discussed and addressed.  No physical exam was performed (except for noted visual exam findings with Video Visits).   I connected with Matthew Miles on 07/18/18 at  1:15 PM EDT by telephone and verified that I am speaking with the correct person using two identifiers. Location patient: home Location provider: work Persons participating in the virtual visit: patient, provider  I discussed the limitations, risks, security and privacy concerns of performing an evaluation and management service by telephone and the availability of in person appointments. I also discussed with the patient that there may be a patient responsible charge related to this service. The patient expressed understanding and agreed to proceed.  Interactive audio and video telecommunications were attempted between this provider and patient, however failed, due to patient having technical difficulties OR patient did not have access to video capability.  We continued and completed visit with audio only.  Reason for visit: 4 month follow-up  HPI: HYPERTENSION  Disease Monitoring  Home BP Monitoring 125/66 at doctors office Chest pain- no    Dyspnea- no Medications  Compliance-  Taking coreg, torsemide. Lightheadedness-  no  Edema- no  DIABETES Disease Monitoring: Blood Sugar ranges-128-140  Polyuria/phagia/dipsia- no      Optho- due Medications: Compliance- taking jardiance, metformin, glipizide, januvia Hypoglycemic symptoms- no  Monoclonal B-cell lymphocytosis: Patient has recently been diagnosed with likely lymphoma.  He had been undergoing imaging through urology that revealed enlarged lymph nodes and an enlarged spleen.  He was  subsequently evaluated by hematology who felt that he likely has lymphoma and they discussed potentially doing a bone marrow biopsy though the patient wanted to discuss this with his family first.  Prostate cancer: He continues to follow with urology.  He notes his urination has improved significantly with his Lupron injections.  Diarrhea: Patient notes he has developed an issue where he will have diarrhea fairly quickly after drinking regular milk.  This does not occur with [place] milk.  No blood in his stool.  No abdominal pain.  Does not occur when he does not drink regular milk.    ROS: See pertinent positives and negatives per HPI.  Past Medical History:  Diagnosis Date  . Allergy   . Arthritis   . CHF (congestive heart failure) ([place])   . Chicken pox   . Cholecystitis   . Colon polyps   . COPD (chronic obstructive pulmonary disease) ([place])   . Coronary artery disease   . Diabetes mellitus without complication ([place])   . Emphysema of lung ([place])   . GERD (gastroesophageal reflux disease)   . Heart murmur   . Hematemesis/vomiting blood 04/10/11  . Hypercholesterolemia   . Mild hypertension   . Pancreatitis   . Prostate cancer ([place])   . Prostate cancer [facility])    radiation therapy   . Smoker   . Ulcer   . Upper GI bleed 1980    Past Surgical History:  Procedure Laterality Date  . APPENDECTOMY    . CARDIAC CATHETERIZATION  May 2002 and Feb 2013   [facility]; no stents   . CATARACT EXTRACTION    . CHOLECYSTECTOMY    . CIRCUMCISION    . COLONOSCOPY    . HEMORRHOID SURGERY    .  SP CHOLECYSTOMY    . STOMACH SURGERY     bleeding ulcers, followed by Dr. [name]    Family History  Problem Relation Age of Onset  . Prostate cancer Father   . Liver cancer Brother   . Prostate cancer Brother   . Emphysema Sister        smoker    SOCIAL HX: Former smoker.   Current Outpatient Medications:  .  alfuzosin (UROXATRAL) 10 MG 24 hr tablet, Take 1 tablet (10 mg total) by mouth  daily with breakfast., Disp: 30 tablet, Rfl: 11 .  aspirin 81 MG tablet, Take 81 mg by mouth daily. , Disp: , Rfl:  .  calcium-vitamin D 250-100 MG-UNIT tablet, Take 1 tablet by mouth 2 (two) times daily., Disp: , Rfl:  .  carvedilol (COREG) 6.25 MG tablet, TAKE 1 TABLET(6.25 MG) BY MOUTH TWICE DAILY, Disp: 180 tablet, Rfl: 1 .  Cyanocobalamin (B-12) 1000 MCG TBCR, Take 1 tablet by mouth daily. , Disp: , Rfl:  .  empagliflozin (JARDIANCE) 10 MG TABS tablet, Take 10 mg by mouth daily., Disp: 30 tablet, Rfl: 2 .  glipiZIDE (GLUCOTROL XL) 10 MG 24 hr tablet, TAKE 1 TABLET(10 MG) BY MOUTH TWICE DAILY, Disp: 180 tablet, Rfl: 0 .  Leuprolide Acetate, 6 Month, (LUPRON) 45 MG injection, Inject into the muscle., Disp: , Rfl:  .  metFORMIN (GLUCOPHAGE-XR) 500 MG 24 hr tablet, TAKE 2 TABLETS(1000 MG) BY MOUTH TWICE DAILY, Disp: 360 tablet, Rfl: 3 .  nitroGLYCERIN (NITROSTAT) 0.4 MG SL tablet, Place 1 tablet (0.4 mg total) under the tongue every 5 (five) minutes as needed., Disp: 25 tablet, Rfl: 0 .  ONETOUCH VERIO test strip, USE AS DIRECTED TO CHECK BLOOD SUGAR TWICE DAILY, Disp: 200 each, Rfl: 2 .  pantoprazole (PROTONIX) 20 MG tablet, TAKE 1 TABLET(20 MG) BY MOUTH DAILY, Disp: 30 tablet, Rfl: 0 .  potassium chloride SA (K-DUR,KLOR-CON) 20 MEQ tablet, TAKE 1 TABLET(20 MEQ) BY MOUTH DAILY, Disp: 90 tablet, Rfl: 0 .  pravastatin (PRAVACHOL) 40 MG tablet, TAKE 1 TABLET(40 MG) BY MOUTH DAILY, Disp: 90 tablet, Rfl: 0 .  PROAIR HFA 108 (90 Base) MCG/ACT inhaler, INHALE 2 PUFFS INTO THE LUNGS EVERY 6 HOURS AS NEEDED FOR WHEEZING OR SHORTNESS OF BREATH, Disp: 8.5 g, Rfl: 0 .  sitaGLIPtin (JANUVIA) 50 MG tablet, Take 1 tablet (50 mg total) by mouth daily., Disp: 90 tablet, Rfl: 1 .  SPIRIVA RESPIMAT 2.5 MCG/ACT AERS, INHALE 2 PUFFS INTO THE LUNGS DAILY, Disp: 4 g, Rfl: 5 .  torsemide (DEMADEX) 10 MG tablet, TAKE 1 TABLET(10 MG) BY MOUTH TWICE DAILY AS NEEDED, Disp: 180 tablet, Rfl: 3  EXAM: No physical exam was  completed as this was a telehealth telephone visit.  ASSESSMENT AND PLAN:  Discussed the following assessment and plan:  Essential hypertension  Type 2 diabetes mellitus without complication, without long-term current use of insulin (HCC)  H/O malignant neoplasm of prostate  Milk intolerance  Monoclonal B-cell lymphocytosis  Essential hypertension Adequately controlled.  He will continue his current regimen.  Diabetes ([place]) Adequately controlled.  He will continue his current regimen.  Plan for an A1c at his next visit in the office in 3 months.  H/O malignant neoplasm of prostate Obstructive symptoms have improved.  He will continue to follow with urology.  Milk intolerance Diarrhea appears to be related to milk intake.  Discussed avoiding regular milk.  Discussed he could attempt Lactaid milk if he would prefer cows milk.  If he  has persistent issues with this he will let us know.  Monoclonal B-cell lymphocytosis Possibly related to lymphoma.  He will continue to consider bone marrow biopsy and follow with hematology.  Social distancing and sick precautions discussed regarding coronavirus.   I discussed the assessment and treatment plan with the patient. The patient was provided an opportunity to ask questions and all were answered. The patient agreed with the plan and demonstrated an understanding of the instructions.   The patient was advised to call back or seek an in-person evaluation if the symptoms worsen or if the condition fails to improve as anticipated.  I provided 20 minutes of non-face-to-face time during this encounter.   [name], MD

## 2018-07-18 NOTE — Assessment & Plan Note (Signed)
Adequately controlled.  He will continue his current regimen.

## 2018-07-18 NOTE — Assessment & Plan Note (Signed)
Adequately controlled.  He will continue his current regimen.  Plan for an A1c at his next visit in the office in 3 months.

## 2018-07-18 NOTE — Telephone Encounter (Signed)
Please contact the patient and get him set up for 24-month follow-up in the office.  Thanks.

## 2018-07-18 NOTE — Assessment & Plan Note (Signed)
Possibly related to lymphoma.  He will continue to consider bone marrow biopsy and follow with hematology.

## 2018-07-30 ENCOUNTER — Other Ambulatory Visit: Payer: Self-pay | Admitting: Family Medicine

## 2018-08-15 ENCOUNTER — Other Ambulatory Visit: Payer: Self-pay | Admitting: Family Medicine

## 2018-08-15 ENCOUNTER — Other Ambulatory Visit: Payer: Self-pay | Admitting: Cardiovascular Disease

## 2018-08-21 ENCOUNTER — Ambulatory Visit: Payer: PPO | Admitting: Oncology

## 2018-08-21 ENCOUNTER — Other Ambulatory Visit: Payer: PPO

## 2018-09-25 ENCOUNTER — Ambulatory Visit: Payer: PPO | Admitting: Cardiovascular Disease

## 2018-09-26 DIAGNOSIS — R161 Splenomegaly, not elsewhere classified: Secondary | ICD-10-CM | POA: Diagnosis not present

## 2018-09-26 DIAGNOSIS — J449 Chronic obstructive pulmonary disease, unspecified: Secondary | ICD-10-CM | POA: Diagnosis not present

## 2018-09-26 DIAGNOSIS — Z79899 Other long term (current) drug therapy: Secondary | ICD-10-CM | POA: Diagnosis not present

## 2018-09-26 DIAGNOSIS — R319 Hematuria, unspecified: Secondary | ICD-10-CM | POA: Diagnosis not present

## 2018-09-26 DIAGNOSIS — R591 Generalized enlarged lymph nodes: Secondary | ICD-10-CM | POA: Diagnosis not present

## 2018-09-26 DIAGNOSIS — C8307 Small cell B-cell lymphoma, spleen: Secondary | ICD-10-CM | POA: Diagnosis not present

## 2018-09-26 DIAGNOSIS — D72820 Lymphocytosis (symptomatic): Secondary | ICD-10-CM | POA: Diagnosis not present

## 2018-09-26 DIAGNOSIS — Z923 Personal history of irradiation: Secondary | ICD-10-CM | POA: Diagnosis not present

## 2018-09-26 DIAGNOSIS — D649 Anemia, unspecified: Secondary | ICD-10-CM | POA: Diagnosis not present

## 2018-09-26 DIAGNOSIS — R590 Localized enlarged lymph nodes: Secondary | ICD-10-CM | POA: Diagnosis not present

## 2018-09-26 DIAGNOSIS — Z87891 Personal history of nicotine dependence: Secondary | ICD-10-CM | POA: Diagnosis not present

## 2018-09-26 DIAGNOSIS — R918 Other nonspecific abnormal finding of lung field: Secondary | ICD-10-CM | POA: Diagnosis not present

## 2018-09-26 DIAGNOSIS — D696 Thrombocytopenia, unspecified: Secondary | ICD-10-CM | POA: Diagnosis not present

## 2018-09-26 DIAGNOSIS — C61 Malignant neoplasm of prostate: Secondary | ICD-10-CM | POA: Diagnosis not present

## 2018-09-26 DIAGNOSIS — Z79818 Long term (current) use of other agents affecting estrogen receptors and estrogen levels: Secondary | ICD-10-CM | POA: Diagnosis not present

## 2018-09-26 DIAGNOSIS — R972 Elevated prostate specific antigen [PSA]: Secondary | ICD-10-CM | POA: Diagnosis not present

## 2018-10-02 DIAGNOSIS — H1045 Other chronic allergic conjunctivitis: Secondary | ICD-10-CM | POA: Diagnosis not present

## 2018-10-02 DIAGNOSIS — H2512 Age-related nuclear cataract, left eye: Secondary | ICD-10-CM | POA: Diagnosis not present

## 2018-10-02 DIAGNOSIS — E113293 Type 2 diabetes mellitus with mild nonproliferative diabetic retinopathy without macular edema, bilateral: Secondary | ICD-10-CM | POA: Diagnosis not present

## 2018-10-02 LAB — HM DIABETES EYE EXAM

## 2018-10-07 ENCOUNTER — Encounter: Payer: Self-pay | Admitting: Family Medicine

## 2018-10-07 DIAGNOSIS — E11319 Type 2 diabetes mellitus with unspecified diabetic retinopathy without macular edema: Secondary | ICD-10-CM | POA: Insufficient documentation

## 2018-10-14 ENCOUNTER — Other Ambulatory Visit: Payer: Self-pay | Admitting: Family Medicine

## 2018-10-22 ENCOUNTER — Other Ambulatory Visit: Payer: Self-pay | Admitting: Family Medicine

## 2018-10-24 ENCOUNTER — Telehealth: Payer: Self-pay | Admitting: Cardiovascular Disease

## 2018-10-24 NOTE — Telephone Encounter (Signed)
Patient daughter calling  Patient scheduled for appointment with Dr Rockey Situ on 7/20 Daughter says patient takes a lot of fluid pills and he is outside in the heat a lot Wants to know if we can do labs to make sure he is hydrated enough Please call to discuss

## 2018-10-24 NOTE — Telephone Encounter (Signed)
Spoke with patients daughter per release form. She states that he will not stop going and going. 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 She reports he has been dizzy and staggering and today 94/50's and after recheck it was 96/60. When he works outside he does have some falls but just keeps going. She is very concerned about dehydration given that he is on fluid pills and the staggering which is new. So she confirms appointment for Monday and I did add daughter to attend appointment with him to address all of their questions and concerns. Recommended that they keep a blood pressure log and bring that into appointment, avoid working out in this heat if at all possible, and if symptoms should worsen to please give [place] a call back. Recommended that if he is outside extended times he needs to hydrate well. She verbalized understanding of our conversation, agreement with plan, and had no further questions at this time.

## 2018-10-25 ENCOUNTER — Other Ambulatory Visit: Payer: Self-pay | Admitting: Family Medicine

## 2018-10-26 ENCOUNTER — Telehealth: Payer: Self-pay | Admitting: Cardiovascular Disease

## 2018-10-26 NOTE — Telephone Encounter (Signed)

## 2018-10-26 NOTE — Progress Notes (Signed)
cardiology Office Note  Date:  10/29/2018   ID:  Matthew Ciresi., DOB [birth year], MRN [id number]  PCP:  [name], MD   Chief Complaint  Patient presents with  . office visit    12 mo F/U    HPI:  Matthew Miles is a 85 yo-old gentleman with history of  DM II, HBA1C 7.5 coronary artery disease (occluded LAD),  moderate pulmonary hypertension in early 2013,  long history of smoking for 60 years, stopped more than 6 years ago COPD bronchitis requiring several courses of antibiotics at the end of 2012,  December for hematemesis and gastric ulcer seen on EGD,  nonsustained VT per the notes,  acute renal failure  Ejection fraction 55% in 2015 who presents for followup Of his coronary artery disease  Dizzy recently, low blood pressure Taking torsemide 10 BID Low blood pressure, 90 systolic  weigth 629 to 528 at home Weight today in office 184 At home 178  Total chol 104, LDL 51 HBA1C 7.3  Denies any chest pain chronic shortness of breath on exertion No exercise program,  chronic mild leg edema, nonpitting  EKG personally reviewed by myself on todays visit Shows normal sinus rhythm rate 88 bpm left anterior fascicular block, old anterior MI  Other past medical history reviewed Matthew Miles reports that Matthew Miles was recently In the hospital, 07/30/2016 GERD symptoms Ate cabbage that day Better with GI cocktail,Ruled out for [facility] records reviewed with the patient in detail Changed to protonix At discharge    Lab work reviewed with him Total chol 130s, LDL 80  Chronic low back pain Reports that Matthew Miles had a bleeding ulcer 4 years ago at that time stop smoking Last echocardiogram in 2015 showing normal ejection fraction, right heart pressures 37 mmHg  left and right heart cath done in February 2013 showing pulmonary hypertension, stable severe coronary artery disease  Normal ejection fraction  Right heart catheterization showed significant fluid overload with  wedge pressure of 24, moderate pulmonary hypertension. Catheterization showed occluded LAD which was old, no other significant stenoses requiring intervention.   PMH:   has a past medical history of Allergy, Arthritis, CHF (congestive heart failure) ([place]), Chicken pox, Cholecystitis, Colon polyps, COPD (chronic obstructive pulmonary disease) ([place]), Coronary artery disease, Diabetes mellitus without complication ([place]), Emphysema of lung ([place]), GERD (gastroesophageal reflux disease), Heart murmur, Hematemesis/vomiting blood (04/10/11), Hypercholesterolemia, Mild hypertension, Pancreatitis, Prostate cancer ([place]), Prostate cancer ([place]), Smoker, Ulcer, and Upper GI bleed (1980).  PSH:    Past Surgical History:  Procedure Laterality Date  . APPENDECTOMY    . CARDIAC CATHETERIZATION  May 2002 and Feb 2013   [facility]; no stents   . CATARACT EXTRACTION    . CHOLECYSTECTOMY    . CIRCUMCISION    . COLONOSCOPY    . HEMORRHOID SURGERY    . SP CHOLECYSTOMY    . STOMACH SURGERY     bleeding ulcers, followed by Dr. [name]    Current Outpatient Medications  Medication Sig Dispense Refill  . alfuzosin (UROXATRAL) 10 MG 24 hr tablet Take 1 tablet (10 mg total) by mouth daily with breakfast. 30 tablet 11  . aspirin 81 MG tablet Take 81 mg by mouth daily.     . calcium-vitamin D 250-100 MG-UNIT tablet Take 1 tablet by mouth 2 (two) times daily.    . carvedilol (COREG) 6.25 MG tablet TAKE 1 TABLET(6.25 MG) BY MOUTH TWICE DAILY 180 tablet 0  . Cyanocobalamin (B-12) 1000 MCG TBCR Take 1  tablet by mouth daily.     Marland Kitchen glipiZIDE (GLUCOTROL XL) 10 MG 24 hr tablet TAKE 1 TABLET(10 MG) BY MOUTH TWICE DAILY 180 tablet 0  . JANUVIA 50 MG tablet TAKE 1 TABLET(50 MG) BY MOUTH DAILY 90 tablet 1  . JARDIANCE 10 MG TABS tablet TAKE 1 TABLET BY MOUTH DAILY 30 tablet 2  . Leuprolide Acetate, 6 Month, (LUPRON) 45 MG injection Inject into the muscle.    . metFORMIN (GLUCOPHAGE-XR) 500 MG 24 hr tablet TAKE 2 TABLETS(1000 MG) BY  MOUTH TWICE DAILY 360 tablet 3  . nitroGLYCERIN (NITROSTAT) 0.4 MG SL tablet Place 1 tablet (0.4 mg total) under the tongue every 5 (five) minutes as needed. 25 tablet 0  . ONETOUCH VERIO test strip USE AS DIRECTED TO CHECK BLOOD SUGAR TWICE DAILY 200 each 2  . pantoprazole (PROTONIX) 20 MG tablet TAKE 1 TABLET(20 MG) BY MOUTH DAILY 90 tablet 1  . potassium chloride SA (K-DUR) 20 MEQ tablet TAKE 1 TABLET(20 MEQ) BY MOUTH DAILY 90 tablet 0  . pravastatin (PRAVACHOL) 40 MG tablet TAKE 1 TABLET(40 MG) BY MOUTH DAILY 90 tablet 0  . PROAIR HFA 108 (90 Base) MCG/ACT inhaler INHALE 2 PUFFS INTO THE LUNGS EVERY 6 HOURS AS NEEDED FOR WHEEZING OR SHORTNESS OF BREATH 8.5 g 0  . SPIRIVA RESPIMAT 2.5 MCG/ACT AERS INHALE 2 PUFFS INTO THE LUNGS DAILY 4 g 5  . torsemide (DEMADEX) 10 MG tablet TAKE 1 TABLET(10 MG) BY MOUTH TWICE DAILY AS NEEDED 180 tablet 3   No current facility-administered medications for this visit.      Allergies:   Sulfa antibiotics and Tradjenta [linagliptin]   Social History:  The patient  reports that Matthew Miles quit smoking about 7 years ago. His smoking use included cigarettes. Matthew Miles has a 65.00 pack-year smoking history. Matthew Miles has quit using smokeless tobacco.  His smokeless tobacco use included chew. Matthew Miles reports current alcohol use. Matthew Miles reports that Matthew Miles does not use drugs.   Family History:   family history includes Emphysema in his sister; Liver cancer in his brother; Prostate cancer in his brother and father.    Review of Systems: Review of Systems  Constitutional: Negative.   Respiratory: Negative.   Gastrointestinal: Negative.   Musculoskeletal: Positive for falls and joint pain.  Neurological: Negative.   Psychiatric/Behavioral: Negative.   All other systems reviewed and are negative.   PHYSICAL EXAM: VS:  BP 130/80 (BP Location: Left Arm, Patient Position: Sitting, Cuff Size: Normal)   Pulse 88   Ht 5\' 3"  (1.6 m)   Wt 184 lb 4 oz (83.6 kg)   SpO2 93%   BMI 32.64 kg/m  , BMI  Body mass index is 32.64 kg/m. Constitutional:  oriented to person, place, and time. No distress.  HENT:  Head: Grossly normal Eyes:  no discharge. No scleral icterus.  Neck: No JVD, no carotid bruits  Cardiovascular: Regular rate and rhythm, no murmurs appreciated Trace edema in LE  Pulmonary/Chest: Clear to auscultation bilaterally, no wheezes or rails Abdominal: Soft.  no distension.  no tenderness.  Musculoskeletal: Normal range of motion Neurological:  normal muscle tone. Coordination normal. No atrophy Skin: Skin warm and dry Psychiatric: normal affect, pleasant   Recent Labs: 12/12/2017: BUN 23; Creatinine, Ser 1.48 02/21/2018: Hemoglobin 9.8; Platelets 83    Lipid Panel Lab Results  Component Value Date   CHOL 104 03/16/2018   HDL 28.50 (L) 03/16/2018   LDLCALC 51 03/16/2018   TRIG 118.0 03/16/2018    Wt  Readings from Last 3 Encounters:  10/29/18 184 lb 4 oz (83.6 kg)  06/25/18 185 lb 1.9 oz (84 kg)  03/16/18 188 lb 6.4 oz (85.5 kg)      ASSESSMENT AND PLAN:   Pulmonary hypertension ([place]) - Plan: EKG 12-Lead Last echo 10/2013, ejection fraction greater than 55% Mildly elevated right heart pressures, on torsemide Regimen as detailed, hold for hot sun  Chronic diastolic CHF (congestive heart failure) ([place]) - Plan: EKG 12-Lead recommended Matthew Miles stay on his torsemide, cut dose for low pressure  Essential hypertension - Plan: EKG 12-Lead Changes as below For weight >178-181, take one torsemide For weight >181 , take two torsemide For long day in hot sun, hold the torsemide  Coronary artery disease involving native coronary artery of native heart without angina pectoris - Plan: EKG 12-Lead Currently with no symptoms of angina. No further workup at this time. Continue current medication regimen.  Mixed hyperlipidemia - Plan: EKG 12-Lead Cholesterol is at goal on the current lipid regimen. No changes to the medications were made.  Uncontrolled type 2 diabetes  mellitus with other circulatory complication, without long-term current use of insulin (HCC) - Hemoglobin A1c 7.3 Watch diet  Chronic obstructive pulmonary disease, unspecified COPD type ([place]) - Plan: EKG 12-Lead Stable, mild sx   Total encounter time more than 25 minutes  Greater than 50% was spent in counseling and coordination of care with the patient   Disposition:   F/U  6 months   No orders of the defined types were placed in this encounter.    Signed, Esmond Plants, M.D., Ph.D. 10/29/2018  [place], [place]

## 2018-10-29 ENCOUNTER — Other Ambulatory Visit: Payer: Self-pay

## 2018-10-29 ENCOUNTER — Ambulatory Visit (INDEPENDENT_AMBULATORY_CARE_PROVIDER_SITE_OTHER): Payer: PPO | Admitting: Family Medicine

## 2018-10-29 ENCOUNTER — Encounter: Payer: Self-pay | Admitting: Family Medicine

## 2018-10-29 ENCOUNTER — Ambulatory Visit: Payer: PPO | Admitting: Cardiovascular Disease

## 2018-10-29 ENCOUNTER — Encounter: Payer: Self-pay | Admitting: Cardiovascular Disease

## 2018-10-29 VITALS — BP 130/80 | HR 88 | Ht 63.0 in | Wt 184.2 lb

## 2018-10-29 DIAGNOSIS — I272 Pulmonary hypertension, unspecified: Secondary | ICD-10-CM | POA: Diagnosis not present

## 2018-10-29 DIAGNOSIS — I10 Essential (primary) hypertension: Secondary | ICD-10-CM | POA: Diagnosis not present

## 2018-10-29 DIAGNOSIS — E782 Mixed hyperlipidemia: Secondary | ICD-10-CM

## 2018-10-29 DIAGNOSIS — E119 Type 2 diabetes mellitus without complications: Secondary | ICD-10-CM

## 2018-10-29 DIAGNOSIS — I5032 Chronic diastolic (congestive) heart failure: Secondary | ICD-10-CM | POA: Diagnosis not present

## 2018-10-29 DIAGNOSIS — J432 Centrilobular emphysema: Secondary | ICD-10-CM

## 2018-10-29 DIAGNOSIS — E1059 Type 1 diabetes mellitus with other circulatory complications: Secondary | ICD-10-CM

## 2018-10-29 DIAGNOSIS — D509 Iron deficiency anemia, unspecified: Secondary | ICD-10-CM

## 2018-10-29 DIAGNOSIS — J449 Chronic obstructive pulmonary disease, unspecified: Secondary | ICD-10-CM | POA: Diagnosis not present

## 2018-10-29 DIAGNOSIS — I25118 Atherosclerotic heart disease of native coronary artery with other forms of angina pectoris: Secondary | ICD-10-CM

## 2018-10-29 MED ORDER — SPIRIVA RESPIMAT 2.5 MCG/ACT IN AERS: 2.0000 | INHALATION_SPRAY | Freq: Every day | RESPIRATORY_TRACT | 5 refills | Status: DC

## 2018-10-29 NOTE — Progress Notes (Signed)
Virtual Visit via telephone Note  This visit type was conducted due to national recommendations for restrictions regarding the COVID-19 pandemic (e.g. social distancing).  This format is felt to be most appropriate for this patient at this time.  All issues noted in this document were discussed and addressed.  No physical exam was performed (except for noted visual exam findings with Video Visits).   I connected with Matthew Miles today at  1:45 PM EDT by telephone and verified that I am speaking with the correct person using two identifiers. Location patient: home Location provider: work Persons participating in the virtual visit: patient, provider  I discussed the limitations, risks, security and privacy concerns of performing an evaluation and management service by telephone and the availability of in person appointments. I also discussed with the patient that there may be a patient responsible charge related to this service. The patient expressed understanding and agreed to proceed.  Interactive audio and video telecommunications were attempted between this provider and patient, however failed, due to patient having technical difficulties OR patient did not have access to video capability.  We continued and completed visit with audio only.   Reason for visit: follow-up  HPI: HYPERTENSION  Disease Monitoring  Home BP Monitoring 94-120/50s-60s Chest pain- no    Dyspnea- chronic, stable related to COPD Medications  Compliance-  Taking coreg and torsemide. Lightheadedness-  One episode, though resolved quickly.  Has not recurred. Edema- no  DIABETES Disease Monitoring: Blood Sugar ranges-<130 through out the day Polyuria/phagia/dipsia- no      Optho- UTD Medications: Compliance- taking jardiance, januvia, glipizide, metformin Hypoglycemic symptoms- no Miles referral to podiatry for diabetic shoes.  He denies sores on his feet or numbness.  COPD: Medication compliance- rarely uses  spiriva, never uses albuterol   Rescue inhaler use- no Dyspnea- yes, chronic and stable  Wheezing- no  Cough- no   History of iron deficiency anemia: Patient notes no melena, bright red blood per rectum, hematuria, or abdominal pain.  He does have a history of gastric ulcers.  ROS: See pertinent positives and negatives per HPI.  Past Medical History:  Diagnosis Date  . Allergy   . Arthritis   . CHF (congestive heart failure) ([place])   . Chicken pox   . Cholecystitis   . Colon polyps   . COPD (chronic obstructive pulmonary disease) ([place])   . Coronary artery disease   . Diabetes mellitus without complication ([place])   . Emphysema of lung ([place])   . GERD (gastroesophageal reflux disease)   . Heart murmur   . Hematemesis/vomiting blood 04/10/11  . Hypercholesterolemia   . Mild hypertension   . Pancreatitis   . Prostate cancer ([place])   . Prostate cancer [facility])    radiation therapy   . Smoker   . Ulcer   . Upper GI bleed 1980    Past Surgical History:  Procedure Laterality Date  . APPENDECTOMY    . CARDIAC CATHETERIZATION  May 2002 and Feb 2013   [facility]; no stents   . CATARACT EXTRACTION    . CHOLECYSTECTOMY    . CIRCUMCISION    . COLONOSCOPY    . HEMORRHOID SURGERY    . SP CHOLECYSTOMY    . STOMACH SURGERY     bleeding ulcers, followed by Dr. Tiffany Miles    Family History  Problem Relation Age of Onset  . Prostate cancer Father   . Liver cancer Brother   . Prostate cancer Brother   . Emphysema Sister  smoker    SOCIAL HX: Former smoker.   Current Outpatient Medications:  .  alfuzosin (UROXATRAL) 10 MG 24 hr tablet, Take 1 tablet (10 mg total) by mouth daily with breakfast., Disp: 30 tablet, Rfl: 11 .  aspirin 81 MG tablet, Take 81 mg by mouth daily. , Disp: , Rfl:  .  calcium-vitamin D 250-100 MG-UNIT tablet, Take 1 tablet by mouth 2 (two) times daily., Disp: , Rfl:  .  carvedilol (COREG) 6.25 MG tablet, TAKE 1 TABLET(6.25 MG) BY MOUTH TWICE DAILY, Disp: 180 tablet,  Rfl: 0 .  Cyanocobalamin (B-12) 1000 MCG TBCR, Take 1 tablet by mouth daily. , Disp: , Rfl:  .  glipiZIDE (GLUCOTROL XL) 10 MG 24 hr tablet, TAKE 1 TABLET(10 MG) BY MOUTH TWICE DAILY, Disp: 180 tablet, Rfl: 0 .  JANUVIA 50 MG tablet, TAKE 1 TABLET(50 MG) BY MOUTH DAILY, Disp: 90 tablet, Rfl: 1 .  JARDIANCE 10 MG TABS tablet, TAKE 1 TABLET BY MOUTH DAILY, Disp: 30 tablet, Rfl: 2 .  Leuprolide Acetate, 6 Month, (LUPRON) 45 MG injection, Inject into the muscle., Disp: , Rfl:  .  metFORMIN (GLUCOPHAGE-XR) 500 MG 24 hr tablet, TAKE 2 TABLETS(1000 MG) BY MOUTH TWICE DAILY, Disp: 360 tablet, Rfl: 3 .  nitroGLYCERIN (NITROSTAT) 0.4 MG SL tablet, Place 1 tablet (0.4 mg total) under the tongue every 5 (five) minutes as needed., Disp: 25 tablet, Rfl: 0 .  ONETOUCH VERIO test strip, USE AS DIRECTED TO CHECK BLOOD SUGAR TWICE DAILY, Disp: 200 each, Rfl: 2 .  pantoprazole (PROTONIX) 20 MG tablet, TAKE 1 TABLET(20 MG) BY MOUTH DAILY, Disp: 90 tablet, Rfl: 1 .  potassium chloride SA (K-DUR) 20 MEQ tablet, TAKE 1 TABLET(20 MEQ) BY MOUTH DAILY, Disp: 90 tablet, Rfl: 0 .  pravastatin (PRAVACHOL) 40 MG tablet, TAKE 1 TABLET(40 MG) BY MOUTH DAILY, Disp: 90 tablet, Rfl: 0 .  PROAIR HFA 108 (90 Base) MCG/ACT inhaler, INHALE 2 PUFFS INTO THE LUNGS EVERY 6 HOURS AS NEEDED FOR WHEEZING OR SHORTNESS OF BREATH, Disp: 8.5 g, Rfl: 0 .  Tiotropium Bromide Monohydrate (SPIRIVA RESPIMAT) 2.5 MCG/ACT AERS, Inhale 2 puffs into the lungs daily., Disp: 4 g, Rfl: 5 .  torsemide (DEMADEX) 10 MG tablet, TAKE 1 TABLET(10 MG) BY MOUTH TWICE DAILY AS NEEDED, Disp: 180 tablet, Rfl: 3  EXAM: This is a telehealth telephone visit and thus no physical exam has been completed.  ASSESSMENT AND PLAN:  Discussed the following assessment and plan:  Essential hypertension Blood pressure has been running slightly low.  He did see cardiology earlier today and they are checking renal function with a CMP.  They advised him to hold his torsemide if  he is out in the heat.  They did give instructions on how to take torsemide based on weight.  He will follow their instructions.  COPD (chronic obstructive pulmonary disease) (HCC) Seems to be relatively stable.  I encouraged him to use his Spiriva daily to see if that would help with his chronic dyspnea on exertion.  If it is beneficial he will continue to use it daily otherwise he does not necessarily need to use it.  Diabetes ([place]) Seems to be well controlled.  He will continue his current regimen.  We will refer to podiatry for foot exam.  He will come in for labs in a month or so.  Iron deficiency anemia We will recheck lab work with his upcoming labs.   Social distancing precautions and sick precautions given regarding COVID-19.  I discussed the assessment and treatment plan with the patient. The patient was provided an opportunity to ask questions and all were answered. The patient agreed with the plan and demonstrated an understanding of the instructions.   The patient was advised to call back or seek an in-person evaluation if the symptoms worsen or if the condition fails to improve as anticipated.  I provided 18 minutes of non-face-to-face time during this encounter.   [name], MD

## 2018-10-29 NOTE — Assessment & Plan Note (Signed)
We will recheck lab work with his upcoming labs.

## 2018-10-29 NOTE — Assessment & Plan Note (Signed)
Seems to be well controlled.  He will continue his current regimen.  We will refer to podiatry for foot exam.  He will come in for labs in a month or so.

## 2018-10-29 NOTE — Patient Instructions (Addendum)
Medication Instructions:  Your physician has recommended you make the following change in your medication:   For weight >178-181, take one torsemide (10 mg) For weight >181 , take two torsemide (20 mg) For long day in hot sun, hold the torsemide  For low pressure and dizziness, drink more fluids  If you need a refill on your cardiac medications before your next appointment, please call your pharmacy.    Lab work: Your physician recommends that you have lab work today: CMET  If you have labs (blood work) drawn today and your tests are completely normal, you will receive your results only by: Marland Kitchen MyChart Message (if you have MyChart) OR . A paper copy in the mail If you have any lab test that is abnormal or we need to change your treatment, we will call you to review the results.   Testing/Procedures: No new testing needed   Follow-Up: At [facility], you and your health needs are our priority.  As part of our continuing mission to provide you with exceptional heart care, we have created designated Provider Care Teams.  These Care Teams include your primary Cardiologist (physician) and Advanced Practice Providers (APPs -  Physician Assistants and Nurse Practitioners) who all work together to provide you with the care you need, when you need it.  . You will need a follow up appointment in 6 months (January 2021) .   Please call our office 2 months in advance to schedule this appointment.  (Call in early November 2021 to schedule)  . Providers on your designated Care Team:   . Murray Hodgkins, NP . Christell Faith, PA-C . Marrianne Mood, PA-C  Any Other Special Instructions Will Be Listed Below (If Applicable).  For educational health videos Log in to : [email or web address] Or : [email or web address], password : [email or web address]

## 2018-10-29 NOTE — Assessment & Plan Note (Signed)
Seems to be relatively stable.  I encouraged him to use his Spiriva daily to see if that would help with his chronic dyspnea on exertion.  If it is beneficial he will continue to use it daily otherwise he does not necessarily need to use it.

## 2018-10-29 NOTE — Progress Notes (Signed)
Pre visit review using our clinic review tool, if applicable. No additional management support is needed unless otherwise documented below in the visit note. 

## 2018-10-29 NOTE — Assessment & Plan Note (Signed)
Blood pressure has been running slightly low.  He did see cardiology earlier today and they are checking renal function with a CMP.  They advised him to hold his torsemide if he is out in the heat.  They did give instructions on how to take torsemide based on weight.  He will follow their instructions.

## 2018-10-30 LAB — COMPREHENSIVE METABOLIC PANEL
ALT: 11 IU/L (ref 0–44)
AST: 14 IU/L (ref 0–40)
Albumin/Globulin Ratio: 2.3 — ABNORMAL HIGH (ref 1.2–2.2)
Albumin: 4.1 g/dL (ref 3.6–4.6)
Alkaline Phosphatase: 79 IU/L (ref 39–117)
BUN/Creatinine Ratio: 13 (ref 10–24)
BUN: 20 mg/dL (ref 8–27)
Bilirubin Total: 0.4 mg/dL (ref 0.0–1.2)
CO2: 20 mmol/L (ref 20–29)
Calcium: 8.9 mg/dL (ref 8.6–10.2)
Chloride: 103 mmol/L (ref 96–106)
Creatinine, Ser: 1.54 mg/dL — ABNORMAL HIGH (ref 0.76–1.27)
GFR calc Af Amer: 46 mL/min/{1.73_m2} — ABNORMAL LOW (ref >59)
GFR calc non Af Amer: 40 mL/min/{1.73_m2} — ABNORMAL LOW (ref >59)
Globulin, Total: 1.8 g/dL (ref 1.5–4.5)
Glucose: 274 mg/dL — ABNORMAL HIGH (ref 65–99)
Potassium: 5.3 mmol/L — ABNORMAL HIGH (ref 3.5–5.2)
Sodium: 140 mmol/L (ref 134–144)
Total Protein: 5.9 g/dL — ABNORMAL LOW (ref 6.0–8.5)

## 2018-11-01 ENCOUNTER — Telehealth: Payer: Self-pay | Admitting: *Deleted

## 2018-11-01 MED ORDER — POTASSIUM CHLORIDE CRYS ER 10 MEQ PO TBCR: 10.0000 meq | EXTENDED_RELEASE_TABLET | Freq: Every day | ORAL | 2 refills | Status: DC

## 2018-11-01 NOTE — Telephone Encounter (Signed)
No answer. Left message to call back on patient's number.  Called next number listed which was his daughter, ok per DPR. She verbalized understanding to have patient decrease potassium to 10 meq once a day. Rx sent to pharmacy. She will let patient know.

## 2018-11-01 NOTE — Telephone Encounter (Signed)
-----   Message from Minna Merritts, MD sent at 11/01/2018  2:52 PM EDT ----- Lab work with stable renal function Potassium running high would cut in half down to 10 mEq daily down from 20

## 2018-11-08 ENCOUNTER — Other Ambulatory Visit: Payer: Self-pay | Admitting: Cardiovascular Disease

## 2018-11-08 ENCOUNTER — Other Ambulatory Visit: Payer: Self-pay

## 2018-11-08 ENCOUNTER — Other Ambulatory Visit: Payer: Self-pay | Admitting: Family Medicine

## 2018-11-08 MED ORDER — ALFUZOSIN HCL ER 10 MG PO TB24: 10.0000 mg | ORAL_TABLET | Freq: Every day | ORAL | 0 refills | Status: DC

## 2018-11-08 NOTE — Telephone Encounter (Signed)
Spoke with Robet Leu patient's daughter gave patient a 30day refill on alfluzosin. She was informed that patient would need a 1 year follow up for further refills she states he may be transferring care to [facility] and they should take over

## 2018-11-09 ENCOUNTER — Other Ambulatory Visit: Payer: Self-pay

## 2018-11-09 MED ORDER — ALFUZOSIN HCL ER 10 MG PO TB24: 10.0000 mg | ORAL_TABLET | Freq: Every day | ORAL | 0 refills | Status: DC

## 2018-11-11 ENCOUNTER — Other Ambulatory Visit: Payer: Self-pay | Admitting: Urology

## 2018-11-19 ENCOUNTER — Ambulatory Visit: Payer: PPO | Admitting: Oncology

## 2018-11-19 ENCOUNTER — Other Ambulatory Visit: Payer: PPO

## 2018-11-26 ENCOUNTER — Other Ambulatory Visit: Payer: Self-pay | Admitting: Family Medicine

## 2018-11-28 ENCOUNTER — Ambulatory Visit: Payer: PPO | Admitting: Podiatry

## 2018-12-03 ENCOUNTER — Other Ambulatory Visit: Payer: Self-pay

## 2018-12-03 ENCOUNTER — Other Ambulatory Visit (INDEPENDENT_AMBULATORY_CARE_PROVIDER_SITE_OTHER): Payer: PPO

## 2018-12-03 DIAGNOSIS — E119 Type 2 diabetes mellitus without complications: Secondary | ICD-10-CM | POA: Diagnosis not present

## 2018-12-03 DIAGNOSIS — D509 Iron deficiency anemia, unspecified: Secondary | ICD-10-CM | POA: Diagnosis not present

## 2018-12-03 LAB — CBC
HCT: 33.6 % — ABNORMAL LOW (ref 39.0–52.0)
Hemoglobin: 10.9 g/dL — ABNORMAL LOW (ref 13.0–17.0)
MCHC: 32.4 g/dL (ref 30.0–36.0)
MCV: 83 fl (ref 78.0–100.0)
Platelets: 89 10*3/uL — ABNORMAL LOW (ref 150.0–400.0)
RBC: 4.05 Mil/uL — ABNORMAL LOW (ref 4.22–5.81)
RDW: 17.6 % — ABNORMAL HIGH (ref 11.5–15.5)
WBC: 4.2 10*3/uL (ref 4.0–10.5)

## 2018-12-03 LAB — MICROALBUMIN / CREATININE URINE RATIO
Creatinine,U: 108.8 mg/dL
Microalb Creat Ratio: 6.1 mg/g (ref 0.0–30.0)
Microalb, Ur: 6.7 mg/dL — ABNORMAL HIGH (ref 0.0–1.9)

## 2018-12-03 LAB — HEMOGLOBIN A1C: Hgb A1c MFr Bld: 7.8 % — ABNORMAL HIGH (ref 4.6–6.5)

## 2018-12-04 LAB — IBC + FERRITIN
Ferritin: 12 ng/mL — ABNORMAL LOW (ref 22.0–322.0)
Iron: 57 ug/dL (ref 42–165)
Saturation Ratios: 13.6 % — ABNORMAL LOW (ref 20.0–50.0)
Transferrin: 300 mg/dL (ref 212.0–360.0)

## 2018-12-06 ENCOUNTER — Telehealth: Payer: Self-pay

## 2018-12-06 DIAGNOSIS — E611 Iron deficiency: Secondary | ICD-10-CM

## 2018-12-09 NOTE — Telephone Encounter (Signed)
Noted  Referral placed.

## 2018-12-13 ENCOUNTER — Ambulatory Visit: Payer: Self-pay | Admitting: Pharmacist

## 2018-12-13 ENCOUNTER — Other Ambulatory Visit: Payer: Self-pay | Admitting: Family Medicine

## 2018-12-13 DIAGNOSIS — E119 Type 2 diabetes mellitus without complications: Secondary | ICD-10-CM

## 2018-12-13 NOTE — Chronic Care Management (AMB) (Signed)
  Chronic Care Management   Note  12/13/2018 Name: Sharieff Corbello. MRN: [id number] DOB: 14-Sep-1928  [name] is a 85 y.o. year old male who is a primary care patient of [name], [name], MD. The CCM team was consulted for assistance with chronic disease management and care coordination needs.    Contacted patient's daughter; she noted that she thinks he would benefit from working together; recommended I call the patient to schedule.   Attempted to contact patient; was unable to leave voicemail. Will attempt outreach again in the next 1-2 weeks.   Catie Darnelle Maffucci, PharmD, [place] Pharmacist [facility] [place] [phone]

## 2018-12-18 ENCOUNTER — Telehealth: Payer: Self-pay

## 2018-12-18 NOTE — Telephone Encounter (Signed)
Copied from [place] [id number]. Topic: General - Other >> Dec 18, 2018  1:46 PM Yvette Rack wrote: Reason for CRM: Pt daughter Lovey Newcomer stated pt goes to [facility] for blood lukemia and they are requesting patient's most recent labs to be faxed to [facility] at fax# [phone]. Cb# [phone]

## 2018-12-20 NOTE — Telephone Encounter (Signed)
Labs have been faxed electronically. 

## 2018-12-21 ENCOUNTER — Telehealth: Payer: Self-pay | Admitting: Family Medicine

## 2018-12-21 DIAGNOSIS — D509 Iron deficiency anemia, unspecified: Secondary | ICD-10-CM

## 2018-12-21 MED ORDER — FERROUS SULFATE 325 (65 FE) MG PO TABS: 325.0000 mg | ORAL_TABLET | Freq: Two times a day (BID) | ORAL | 1 refills | Status: DC

## 2018-12-21 NOTE — Telephone Encounter (Signed)
[facility] and informed him that Dr. [name] will manage his Iron deficiency and I called the daughter of the patient and informed her that the medication was sent to pharmacy and she will l call back to schedule a lab appt in 1 month.  Nina,cma

## 2018-12-21 NOTE — Telephone Encounter (Signed)
Matt called to inquire if Dr [name] was planning to manage pt's anemia due to  iron deficiency or of Dr Caprice Beaver should take over mgmt for that particular condition.

## 2018-12-21 NOTE — Telephone Encounter (Signed)
Matt called to inquire if Dr [name] was planning to manage pt's anemia due to  iron deficiency or of Dr Caprice Beaver should take over mgmt for that particular condition.  Please advise: [phone]

## 2018-12-21 NOTE — Telephone Encounter (Signed)
I can manage this. We will start him on an iron supplement. We have referred him to GI for evaluation. Please let the patient and his daughter know that I have sent in an iron supplement for him to start on. We will need to recheck his labs in 4 weeks. Thanks.

## 2018-12-26 ENCOUNTER — Ambulatory Visit: Payer: PPO | Admitting: Orthotics

## 2018-12-26 ENCOUNTER — Other Ambulatory Visit: Payer: Self-pay

## 2018-12-26 DIAGNOSIS — M2041 Other hammer toe(s) (acquired), right foot: Secondary | ICD-10-CM

## 2018-12-26 NOTE — Progress Notes (Addendum)
Repeat 2018 dbs order; new casts done in foam.   Noticed when ordering that patient hasn't seen dr. In 2020; told daughter he woujld have to get appointment with Dr. Lemmie Evens or Dr. Jerilynn Mages befor I could order.

## 2018-12-27 NOTE — Telephone Encounter (Signed)
Called patient once I noticed he hasn't seen one of our Doctors this year.  He had previously had an appointment with Dr. Milinda Pointer in August and it was cancelled for some reason; told daughter he needed to make appointment with eihter Dr. Lemmie Evens or Dr. Jerilynn Mages before I could order shoes.

## 2018-12-28 DIAGNOSIS — C61 Malignant neoplasm of prostate: Secondary | ICD-10-CM | POA: Diagnosis not present

## 2018-12-28 DIAGNOSIS — R161 Splenomegaly, not elsewhere classified: Secondary | ICD-10-CM | POA: Diagnosis not present

## 2018-12-28 DIAGNOSIS — R972 Elevated prostate specific antigen [PSA]: Secondary | ICD-10-CM | POA: Diagnosis not present

## 2018-12-28 DIAGNOSIS — D72820 Lymphocytosis (symptomatic): Secondary | ICD-10-CM | POA: Diagnosis not present

## 2018-12-28 DIAGNOSIS — C8307 Small cell B-cell lymphoma, spleen: Secondary | ICD-10-CM | POA: Diagnosis not present

## 2018-12-28 DIAGNOSIS — Z923 Personal history of irradiation: Secondary | ICD-10-CM | POA: Diagnosis not present

## 2018-12-28 DIAGNOSIS — Z79818 Long term (current) use of other agents affecting estrogen receptors and estrogen levels: Secondary | ICD-10-CM | POA: Diagnosis not present

## 2018-12-28 DIAGNOSIS — R590 Localized enlarged lymph nodes: Secondary | ICD-10-CM | POA: Diagnosis not present

## 2018-12-31 ENCOUNTER — Ambulatory Visit: Payer: PPO | Admitting: Podiatry

## 2018-12-31 ENCOUNTER — Encounter: Payer: Self-pay | Admitting: Podiatry

## 2018-12-31 ENCOUNTER — Other Ambulatory Visit: Payer: Self-pay

## 2018-12-31 DIAGNOSIS — E1142 Type 2 diabetes mellitus with diabetic polyneuropathy: Secondary | ICD-10-CM | POA: Diagnosis not present

## 2018-12-31 DIAGNOSIS — E1140 Type 2 diabetes mellitus with diabetic neuropathy, unspecified: Secondary | ICD-10-CM | POA: Insufficient documentation

## 2018-12-31 NOTE — Progress Notes (Signed)
This patient presents to the office with chief complaint of  diabetic feet.  This patient  says there  is  no pain and discomfort in his  feet.    Patient has no history of infection or drainage from both feet.  Patient requests diabetic shoes.. This patient presents  to the office today for diabetic shoes  and a foot evaluation due to history of  diabetes.  General Appearance  Alert, conversant and in no acute stress.  Vascular  Dorsalis pedis and posterior tibial  pulses are weakly  palpable  bilaterally.  Capillary return is within normal limits  bilaterally. Temperature is within normal limits  bilaterally.  Neurologic  Senn-Weinstein monofilament wire test diminished   bilaterally. Muscle power within normal limits bilaterally.  Nails Thick disfigured discolored nails with subungual debris  from hallux to fifth toes bilaterally asymptomatic.  No evidence of bacterial infection or drainage bilaterally.  Orthopedic  No limitations of motion of motion feet .  No crepitus or effusions noted.  No bony pathology or digital deformities noted. DJD 1st MPJ  B/L.  Hammer toes  B/L.  Skin  normotropic skin with no porokeratosis noted bilaterally.  No signs of infections or ulcers noted.     Onychomycosis  Diabetes with no foot neuropathy.  ROV.  A diabetic foot exam was performed and there is  evidence of  vascular and  neurologic pathology.  Patient qualifies for diabetic shoes due to DPN and DJD  B/L. And hammer toes   Prn.   Gardiner Barefoot DPM

## 2019-01-07 ENCOUNTER — Telehealth: Payer: PPO

## 2019-01-07 ENCOUNTER — Other Ambulatory Visit: Payer: Self-pay | Admitting: Family Medicine

## 2019-01-07 ENCOUNTER — Ambulatory Visit: Payer: Self-pay | Admitting: Pharmacist

## 2019-01-07 NOTE — Chronic Care Management (AMB) (Signed)
  Chronic Care Management   Note  01/07/2019 Name: Bates Dennington. MRN: [id number] DOB: November 09, 1928  [name] is a 85 y.o. year old male who is a primary care patient of [name], [name], MD. The CCM team was consulted for assistance with chronic disease management and care coordination needs.    Attempted to contact patient to discuss CCM program. Left HIPAA compliant message asking patient to return my call at their convenience.   Follow up plan: - If I do not hear back, will outreach patient again in the next ~4 weeks.   Catie Darnelle Maffucci, PharmD, [place] Pharmacist [facility] [place] [phone]

## 2019-01-15 ENCOUNTER — Other Ambulatory Visit: Payer: Self-pay

## 2019-01-16 ENCOUNTER — Encounter: Payer: Self-pay | Admitting: Family Medicine

## 2019-01-16 ENCOUNTER — Ambulatory Visit (INDEPENDENT_AMBULATORY_CARE_PROVIDER_SITE_OTHER): Payer: PPO | Admitting: Family Medicine

## 2019-01-16 ENCOUNTER — Other Ambulatory Visit: Payer: Self-pay

## 2019-01-16 VITALS — BP 140/50 | HR 71 | Temp 97.3°F | Ht 63.0 in | Wt 185.6 lb

## 2019-01-16 DIAGNOSIS — L89321 Pressure ulcer of left buttock, stage 1: Secondary | ICD-10-CM | POA: Diagnosis not present

## 2019-01-16 DIAGNOSIS — N3941 Urge incontinence: Secondary | ICD-10-CM

## 2019-01-16 DIAGNOSIS — E875 Hyperkalemia: Secondary | ICD-10-CM | POA: Diagnosis not present

## 2019-01-16 DIAGNOSIS — D509 Iron deficiency anemia, unspecified: Secondary | ICD-10-CM | POA: Diagnosis not present

## 2019-01-16 DIAGNOSIS — L8990 Pressure ulcer of unspecified site, unspecified stage: Secondary | ICD-10-CM | POA: Insufficient documentation

## 2019-01-16 LAB — CBC
HCT: 34.7 % — ABNORMAL LOW (ref 39.0–52.0)
Hemoglobin: 11.2 g/dL — ABNORMAL LOW (ref 13.0–17.0)
MCHC: 32.3 g/dL (ref 30.0–36.0)
MCV: 84.9 fl (ref 78.0–100.0)
Platelets: 100 10*3/uL — ABNORMAL LOW (ref 150.0–400.0)
RBC: 4.08 Mil/uL — ABNORMAL LOW (ref 4.22–5.81)
RDW: 17.7 % — ABNORMAL HIGH (ref 11.5–15.5)
WBC: 5.3 10*3/uL (ref 4.0–10.5)

## 2019-01-16 LAB — BASIC METABOLIC PANEL
BUN: 20 mg/dL (ref 6–23)
CO2: 24 mEq/L (ref 19–32)
Calcium: 9.1 mg/dL (ref 8.4–10.5)
Chloride: 104 mEq/L (ref 96–112)
Creatinine, Ser: 1.46 mg/dL (ref 0.40–1.50)
GFR: 45.56 mL/min — ABNORMAL LOW (ref >60.00)
Glucose, Bld: 223 mg/dL — ABNORMAL HIGH (ref 70–99)
Potassium: 4.9 mEq/L (ref 3.5–5.1)
Sodium: 138 mEq/L (ref 135–145)

## 2019-01-16 LAB — IBC + FERRITIN
Ferritin: 28.2 ng/mL (ref 22.0–322.0)
Iron: 51 ug/dL (ref 42–165)
Saturation Ratios: 14.5 % — ABNORMAL LOW (ref 20.0–50.0)
Transferrin: 252 mg/dL (ref 212.0–360.0)

## 2019-01-16 MED ORDER — CEPHALEXIN 500 MG PO CAPS: 500.0000 mg | ORAL_CAPSULE | Freq: Two times a day (BID) | ORAL | 0 refills | Status: DC

## 2019-01-16 MED ORDER — ZINC OXIDE 13 % EX CREA: TOPICAL_CREAM | CUTANEOUS | 1 refills | Status: DC

## 2019-01-16 NOTE — Progress Notes (Signed)
[name], MD Phone: [phone]  Matthew Miles. is a 85 y.o. male who presents today for follow-up.  Wound: Notes this is on his left buttocks.  He was riding on a lawnmower and thinks it may have rubbed the area.  Since then it has been sore and getting a little worse.  There is redness around the area.  It is a little raw.  It does scab up and then the scab will come off.  No purulent drainage.  Rare bleeding.  No fevers.  Urge incontinence: This is a chronic issue that is stable.  He will get urgency and then have to go urinate right away.  He does wear a diaper for this when he goes out.  He follows with urology.  Iron deficiency anemia: He is on an iron supplement.  No blood in his urine.  No blood in his stool.  He was supposed to see GI though he canceled that appointment.  Social History   Tobacco Use  Smoking Status Former Smoker  . Packs/day: 1.00  . Years: 65.00  . Pack years: 65.00  . Types: Cigarettes  . Quit date: 04/10/2011  . 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 Types: Chew     ROS see history of present illness  Objective  Physical Exam Vitals:   01/16/19 0957  BP: (!) 140/50  Pulse: 71  Temp: (!) 97.3 F (36.3 C)  SpO2: 96%    BP Readings from Last 3 Encounters:  01/16/19 (!) 140/50  10/29/18 130/80  06/25/18 126/64   Wt Readings from Last 3 Encounters:  01/16/19 185 lb 9.6 oz (84.2 kg)  10/29/18 184 lb 4 oz (83.6 kg)  06/25/18 185 lb 1.9 oz (84 kg)    Physical Exam Constitutional:      General: He is not in acute distress.    Appearance: He is not diaphoretic.  Pulmonary:     Effort: Pulmonary effort is normal.  Skin:    General: Skin is warm and dry.     Comments: Wound on left buttocks with about a 10 cm area of erythema, there are abrasions centrally, there is mild bleeding, no purulent drainage, the area is slightly warm, there is some tenderness, there is no induration, there is no fluctuance   Neurological:     Mental Status: He is alert.      Assessment/Plan: Please see individual problem list.  Pressure ulcer Suspect that this could be a pressure ulcer/pressure injury versus abrasions with infection.  There are abrasions centrally and with some erythema and warmth surrounding the abrasions there is concern for cellulitis.  We will treat with Keflex.  This was renally dosed.  We will have him use a barrier cream as well.  Discussed taking pressure off the area and he has a pillow with which he can do this.  He will monitor and follow-up early next week.  Given return precautions.  Iron deficiency anemia He is doing well on iron supplementation.  He will continue this.  I discussed the importance of having him see GI to see if they thought endoscopy and colonoscopy were necessary to help rule out a specific lesion that is contributing to the iron deficiency.  He is willing to see them.  Urge incontinence Chronic issue that is stable.  He will continue to monitor and follow with urology.    Orders Placed This Encounter  Procedures  . CBC  . IBC + Ferritin  .  Basic Metabolic Panel (BMET)  . Ambulatory referral to Gastroenterology    Referral Priority:   Routine    Referral Type:   Consultation    Referral Reason:   Specialty Services Required    Number of Visits Requested:   1    Meds ordered this encounter  Medications  . cephALEXin (KEFLEX) 500 MG capsule    Sig: Take 1 capsule (500 mg total) by mouth 2 (two) times daily.    Dispense:  14 capsule    Refill:  0  . Zinc Oxide 13 % CREA    Sig: Apply as needed to the wound on your left buttocks    Dispense:  113 g    Refill:  [place], MD [place]

## 2019-01-16 NOTE — Assessment & Plan Note (Signed)
He is doing well on iron supplementation.  He will continue this.  I discussed the importance of having him see GI to see if they thought endoscopy and colonoscopy were necessary to help rule out a specific lesion that is contributing to the iron deficiency.  He is willing to see them.

## 2019-01-16 NOTE — Assessment & Plan Note (Signed)
Chronic issue that is stable.  He will continue to monitor and follow with urology.

## 2019-01-16 NOTE — Assessment & Plan Note (Signed)
Suspect that this could be a pressure ulcer/pressure injury versus abrasions with infection.  There are abrasions centrally and with some erythema and warmth surrounding the abrasions there is concern for cellulitis.  We will treat with Keflex.  This was renally dosed.  We will have him use a barrier cream as well.  Discussed taking pressure off the area and he has a pillow with which he can do this.  He will monitor and follow-up early next week.  Given return precautions.

## 2019-01-16 NOTE — Patient Instructions (Signed)
Nice to see you. Please take antibiotic for the wound on your hip and use the barrier cream for this. If there is spreading redness or increasing pain or you develop drainage of pus or any fevers please seek medical attention. We will get lab work today and contact you with results.

## 2019-01-17 ENCOUNTER — Ambulatory Visit: Payer: PPO | Admitting: Gastroenterology

## 2019-01-22 ENCOUNTER — Other Ambulatory Visit: Payer: Self-pay

## 2019-01-23 ENCOUNTER — Other Ambulatory Visit: Payer: Self-pay

## 2019-01-23 ENCOUNTER — Ambulatory Visit (INDEPENDENT_AMBULATORY_CARE_PROVIDER_SITE_OTHER): Payer: PPO | Admitting: Family Medicine

## 2019-01-23 ENCOUNTER — Encounter: Payer: Self-pay | Admitting: Family Medicine

## 2019-01-23 VITALS — BP 90/70 | HR 84 | Temp 97.1°F | Ht 63.0 in | Wt 185.0 lb

## 2019-01-23 DIAGNOSIS — L89321 Pressure ulcer of left buttock, stage 1: Secondary | ICD-10-CM | POA: Diagnosis not present

## 2019-01-23 DIAGNOSIS — D509 Iron deficiency anemia, unspecified: Secondary | ICD-10-CM | POA: Diagnosis not present

## 2019-01-23 DIAGNOSIS — R8290 Unspecified abnormal findings in urine: Secondary | ICD-10-CM

## 2019-01-23 LAB — POCT URINALYSIS DIPSTICK
Bilirubin, UA: NEGATIVE
Blood, UA: NEGATIVE
Glucose, UA: POSITIVE — AB
Ketones, UA: NEGATIVE
Leukocytes, UA: NEGATIVE
Nitrite, UA: NEGATIVE
Protein, UA: POSITIVE — AB
Spec Grav, UA: >=1.03 — AB (ref 1.010–1.025)
Urobilinogen, UA: 0.2 E.U./dL
pH, UA: 5.5 (ref 5.0–8.0)

## 2019-01-23 NOTE — Assessment & Plan Note (Signed)
Patient will continue barrier cream for the rest of the week and then monitor the area.  He will complete his antibiotics.  If he has recurrent issues with the area he will let us know.

## 2019-01-23 NOTE — Progress Notes (Signed)
[name], MD Phone: [phone]  Matthew Miles. is a 85 y.o. male who presents today for follow-up.  Buttock wound: This has healed up completely.  He notes no fevers.  No drainage.  He still has 1 or 2 doses of antibiotics left.  He is still using the barrier cream.  Iron deficiency anemia: Patient notes he does not want to proceed with GI evaluation at this time.  His oncology office advised risk-benefit discussion.  He notes no blood in stool.  No blood in his urine.  I discussed the benefit of the work-up would include potentially identifying a cause of his iron deficiency and being able to treat it if needed.  Discussed potential causes of iron deficiency related to the GI tract including ulceration in the stomach, bleeding vessels elsewhere in his GI tract, or a cancerous cause.  Discussed that there are risks to undergoing colonoscopy and EGD including perforated bowels, bleeding, death, airway obstruction, and aspiration.  Social History   Tobacco Use  Smoking Status Former Smoker  . Packs/day: 1.00  . Years: 65.00  . Pack years: 65.00  . Types: Cigarettes  . Quit date: 04/10/2011  . 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 Types: Chew     ROS see history of present illness  Objective  Physical Exam Vitals:   01/23/19 1448  BP: 90/70  Pulse: 84  Temp: (!) 97.1 F (36.2 C)  SpO2: 94%    BP Readings from Last 3 Encounters:  01/23/19 90/70  01/16/19 (!) 140/50  10/29/18 130/80   Wt Readings from Last 3 Encounters:  01/23/19 185 lb (83.9 kg)  01/16/19 185 lb 9.6 oz (84.2 kg)  10/29/18 184 lb 4 oz (83.6 kg)    Physical Exam Constitutional:      General: He is not in acute distress.    Appearance: He is not diaphoretic.  Pulmonary:     Effort: Pulmonary effort is normal.  Skin:    General: Skin is warm and dry.     Comments: Healing quite well over his left buttocks, no signs of infection, no open wounds  Neurological:   Mental Status: He is alert.      Assessment/Plan: Please see individual problem list.  Pressure ulcer Patient will continue barrier cream for the rest of the week and then monitor the area.  He will complete his antibiotics.  If he has recurrent issues with the area he will let us know.   Iron deficiency anemia Patient does not want to proceed with GI evaluation at this time.  I discussed the risks and benefits of EGD and colonoscopy with the patient and he notes he wants to hold off on further evaluation.  Discussed that if he has any bleeding or if he develops any signs of fatigue, trouble breathing, or any new symptoms he needs to let us know immediately as these could be signs of a larger bleed.  We will check stool cards though I did discuss with him that this does not completely rule out a GI bleed.  We will check a urinalysis as well to rule out hematuria.   Orders Placed This Encounter  Procedures  . Fecal occult blood, imunochemical    Standing Status:   Future    Standing Expiration Date:   01/23/2020  . POCT Urinalysis Dipstick    No orders of the defined types were placed in this encounter.    [name], MD [facility] -  [employer]

## 2019-01-23 NOTE — Patient Instructions (Signed)
Nice to see you. Please complete the stool cards. Please continue the barrier cream on the area on your buttock for the rest of the week and finish the antibiotics.  If you have recurrent issues in that area please let us know.

## 2019-01-23 NOTE — Assessment & Plan Note (Signed)
Patient does not want to proceed with GI evaluation at this time.  I discussed the risks and benefits of EGD and colonoscopy with the patient and he notes he wants to hold off on further evaluation.  Discussed that if he has any bleeding or if he develops any signs of fatigue, trouble breathing, or any new symptoms he needs to let us know immediately as these could be signs of a larger bleed.  We will check stool cards though I did discuss with him that this does not completely rule out a GI bleed.  We will check a urinalysis as well to rule out hematuria.

## 2019-01-24 ENCOUNTER — Telehealth: Payer: Self-pay | Admitting: Family Medicine

## 2019-01-24 LAB — URINALYSIS, MICROSCOPIC ONLY

## 2019-01-24 NOTE — Telephone Encounter (Signed)
Patient daughter [name] is calling to asking did her father complete the stool sample correctly. Please advise CB- [phone]

## 2019-01-25 NOTE — Telephone Encounter (Signed)
[name] returning call. Per FC, unavailable. Requesting call back.

## 2019-01-25 NOTE — Telephone Encounter (Signed)
I called and left a voicemail for Matthew Miles to call back and ask for me, she had a question about the stoll sample her father had to do.  [name],cma

## 2019-01-25 NOTE — Telephone Encounter (Signed)
I called and spoke to [name] the patient's daughter and she stated he mailed the stool cards to the office and she wanted to know did the stool have to be refrigerated before he mailed it and I informed her no he was just to put it in the mail, she stated he mailed it yesterday, I informed her that if it was not done correctly the lab would reach out to her and let them know.  Nina,cma

## 2019-01-27 ENCOUNTER — Other Ambulatory Visit: Payer: Self-pay | Admitting: Family Medicine

## 2019-01-29 ENCOUNTER — Telehealth: Payer: Self-pay

## 2019-01-29 NOTE — Telephone Encounter (Signed)
They should contact his pharmacy to determine if his metformin was part of the recall. The pharmacy should be able to provide them with this information. Thanks.

## 2019-01-31 NOTE — Telephone Encounter (Signed)
I called and spoke with the  Patient's daughter [name] and informed her to check with the pharmacy about the recall, she understood.  Shaya Reddick,cma

## 2019-02-04 ENCOUNTER — Ambulatory Visit: Payer: PPO | Admitting: Pharmacist

## 2019-02-04 NOTE — Chronic Care Management (AMB) (Signed)
  Chronic Care Management   Note  02/04/2019 Name: Matthew Miles. MRN: [id number] DOB: [birth year]  [name] is a 85 y.o. year old male who is a primary care patient of [name], [name], MD. The CCM team was consulted for assistance with chronic disease management and care coordination needs.    Contacted patient to discuss CCM team; spoke with him. Explained CCM goals. Patient notes he is not interested at this time, but will let Dr. [name] know in the future if he is interested.   Catie Darnelle Maffucci, PharmD, [place] Pharmacist [facility] [phone]

## 2019-02-06 ENCOUNTER — Other Ambulatory Visit: Payer: Self-pay | Admitting: Family Medicine

## 2019-02-10 ENCOUNTER — Other Ambulatory Visit: Payer: Self-pay | Admitting: Family Medicine

## 2019-02-22 ENCOUNTER — Telehealth: Payer: Self-pay | Admitting: Family Medicine

## 2019-02-22 NOTE — Telephone Encounter (Signed)
Please call and let the patient know that I received a letter from his podiatrist regarding diabetic shoes.  They require documentation from [place] regarding findings on his feet.  It appears that I have not done a foot exam on him in some time and I will need to complete a foot exam on him to be able to complete these forms.  Please get him scheduled for an in person visit for this.  Thanks.

## 2019-02-25 NOTE — Telephone Encounter (Signed)
I called the patients daughter and st stated that the podiatrist already did a foot exam on the patient and there is a note stating that in the chart she stated she would call the podiatrist and see if they could fill out the form for his shoes because they did an exam in sept/ oct. She also stated she needed another stool card because we have not received the one he did, I fix the cards and  Put them at the front desk ready for pick up.  Tredarius Cobern,cma

## 2019-03-04 ENCOUNTER — Ambulatory Visit: Payer: PPO | Admitting: Family Medicine

## 2019-03-12 ENCOUNTER — Telehealth: Payer: Self-pay | Admitting: Podiatry

## 2019-03-12 NOTE — Telephone Encounter (Signed)
Pt left message checking on status of diabetic shoes.  I returned call and left message that Dr [name] has not signed paperwork for the diabetic shoes and that is what is holding them up.Marland KitchenMarland Kitchen

## 2019-03-13 ENCOUNTER — Telehealth: Payer: Self-pay | Admitting: Family Medicine

## 2019-03-13 ENCOUNTER — Telehealth: Payer: Self-pay

## 2019-03-13 DIAGNOSIS — R350 Frequency of micturition: Secondary | ICD-10-CM

## 2019-03-13 NOTE — Telephone Encounter (Signed)
Santiago Glad at the front is reaching out to the patient's daughter and scheduling a virtual visit for the patient.  Nina,cma

## 2019-03-13 NOTE — Telephone Encounter (Signed)
Patient daughter Matthew Miles called to check if the paper work from [employer] was found and completed for her father to get his Diabetic shoes. Per Ms Matthew Miles they need this paper work back today signed by an MD and if it was not found please call her so she can pick up a copy and come by with it to be signed and returned by 3 PM today. Please call Matthew Miles at Ph# [phone]

## 2019-03-13 NOTE — Telephone Encounter (Signed)
I called the number on the form and the lady stated the provider needed to only sign and it is ok that he did not do a foot exam because they are awaiting to hear from the podiatrist office to just sign that he agrees and fax and I faxed it today to the place [employer].  Avya Flavell,cma

## 2019-03-13 NOTE — Telephone Encounter (Signed)
Pt wife called about pt having trouble urination with burning. Pt is scheduled for 12/7. Pt goes a little bit. This has happened before. Please advise and Thank you!  Call pt @ [phone].

## 2019-03-13 NOTE — Telephone Encounter (Signed)
Please call the number on the form to check on a couple of things. His last visit was virtual due to the [place] pandemic and thus I have not been able to do a foot exam on him or document a foot exam. He was referred to podiatry to complete the foot exam. Please find out if it is ok that there is no documentation in my note of a foot exam. I can sign off on the forms if this is ok.

## 2019-03-13 NOTE — Telephone Encounter (Signed)
Paperwork done and given to Lorriane Shire to give to patient's daughter.  Nina,cma

## 2019-03-13 NOTE — Telephone Encounter (Signed)
Pt dropped off paper work to be fitted for therapeutic footwear. Paper work is up front in [employer]

## 2019-03-13 NOTE — Telephone Encounter (Signed)
Noted. Urinalysis ordered. Patient should complete a virtual visit if possible for this. Thanks.

## 2019-03-13 NOTE — Telephone Encounter (Signed)
Error.  Martin Smeal,cma  

## 2019-03-14 ENCOUNTER — Other Ambulatory Visit: Payer: Self-pay | Admitting: Family Medicine

## 2019-03-14 ENCOUNTER — Other Ambulatory Visit: Payer: Self-pay

## 2019-03-14 ENCOUNTER — Other Ambulatory Visit (INDEPENDENT_AMBULATORY_CARE_PROVIDER_SITE_OTHER): Payer: PPO

## 2019-03-14 DIAGNOSIS — R8290 Unspecified abnormal findings in urine: Secondary | ICD-10-CM

## 2019-03-14 DIAGNOSIS — R350 Frequency of micturition: Secondary | ICD-10-CM | POA: Diagnosis not present

## 2019-03-14 LAB — URINALYSIS, MICROSCOPIC ONLY

## 2019-03-14 LAB — POCT URINALYSIS DIPSTICK
Bilirubin, UA: NEGATIVE
Glucose, UA: POSITIVE — AB
Ketones, UA: NEGATIVE
Nitrite, UA: NEGATIVE
Protein, UA: NEGATIVE
Spec Grav, UA: 1.015 (ref 1.010–1.025)
Urobilinogen, UA: 0.2 E.U./dL
pH, UA: 5 (ref 5.0–8.0)

## 2019-03-14 NOTE — Addendum Note (Signed)
Addended by: Elpidio Galea T on: 03/14/2019 12:29 PM   Modules accepted: Orders

## 2019-03-15 LAB — URINE CULTURE
MICRO NUMBER:: 1160328
SPECIMEN QUALITY:: ADEQUATE

## 2019-03-16 IMAGING — US US RENAL
1 series · 14 of 25 positions shown · non-contrast
Comparison: [DATE]

CLINICAL DATA: Chronic renal disease

EXAM:
RENAL / URINARY TRACT ULTRASOUND COMPLETE

[Series 1: us renal · 0.25mm/px · 14 of 34 slices shown]
[im 1/34]
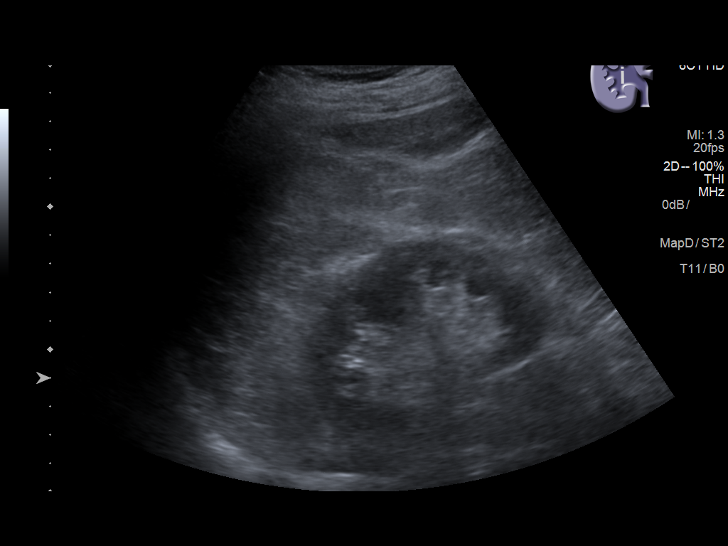
[im 3/34]
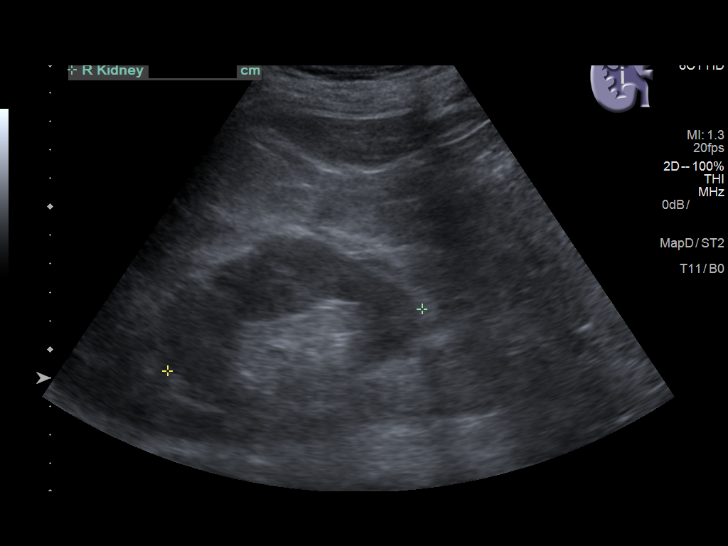
[im 6/34]
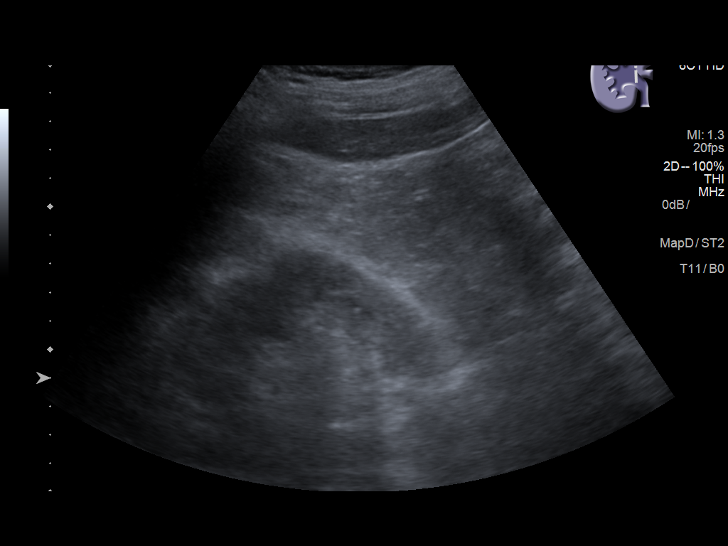
[im 9/34]
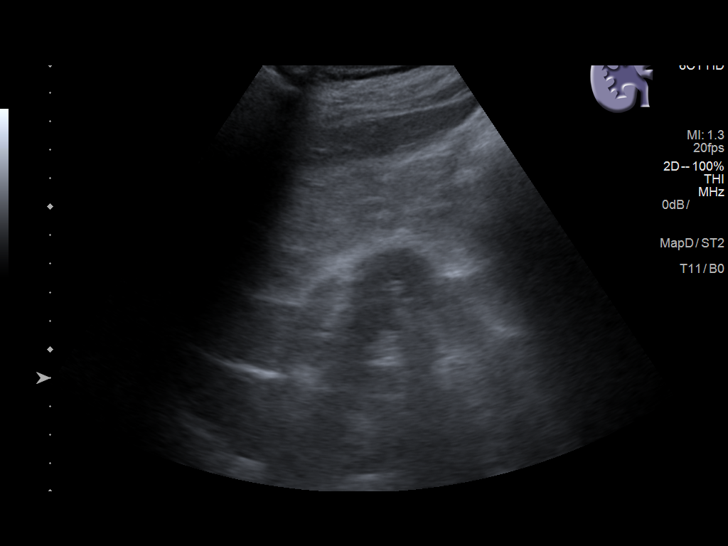
[im 12/34]
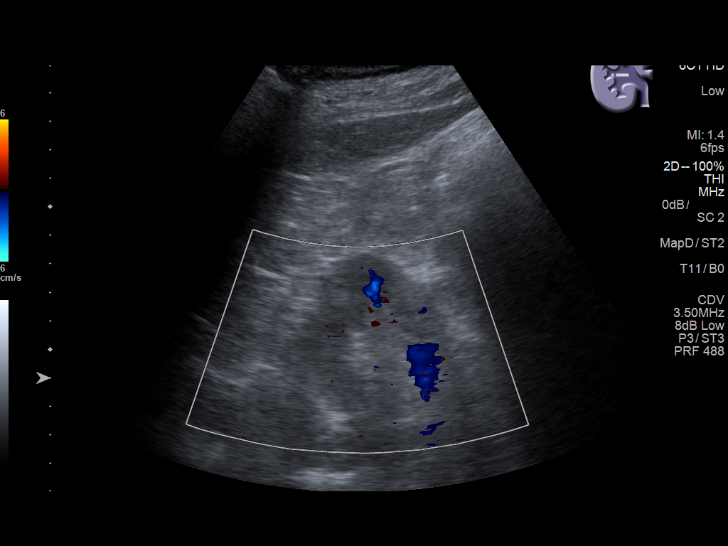
[im 13/34]
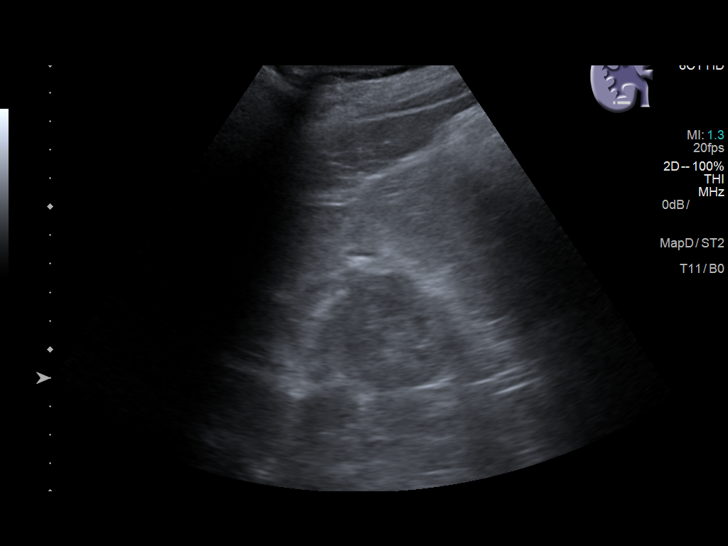
[im 16/34]
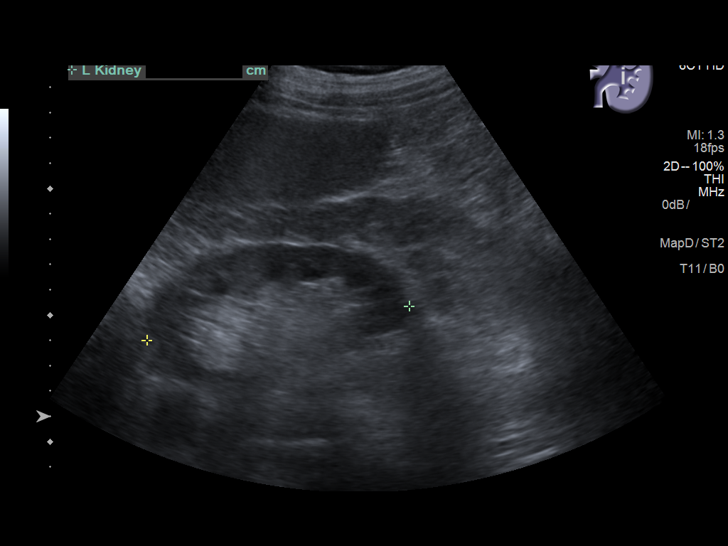
[im 18/34]
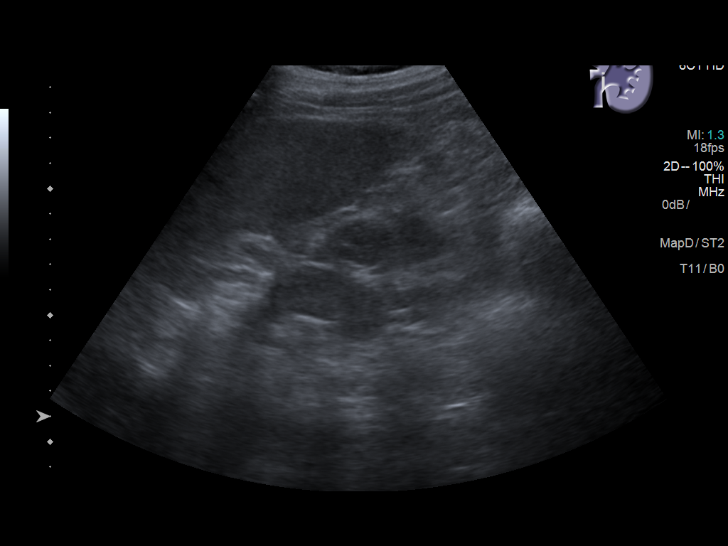
[im 21/34]
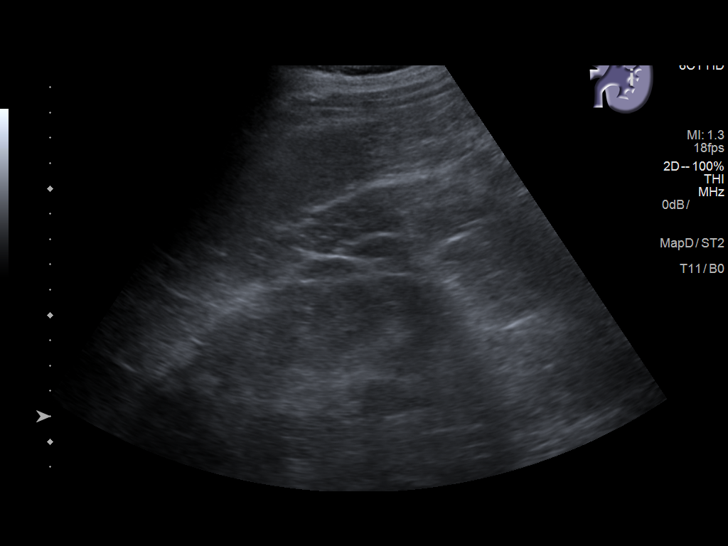
[im 23/34]
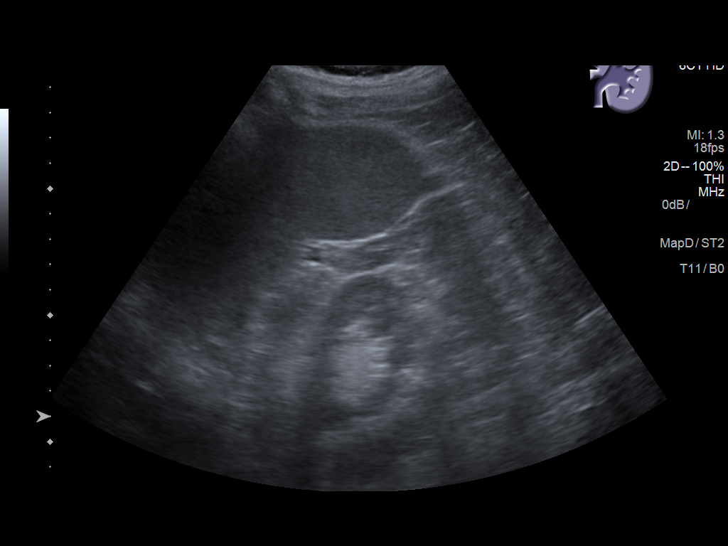
[im 25/34]
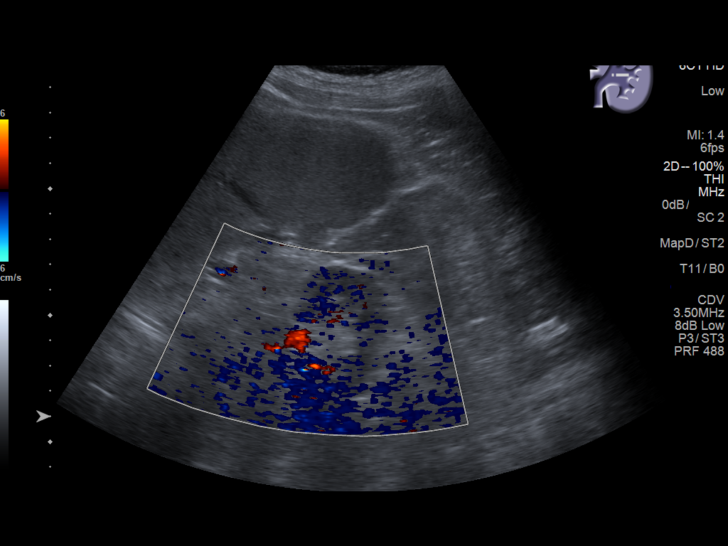
[im 28/34]
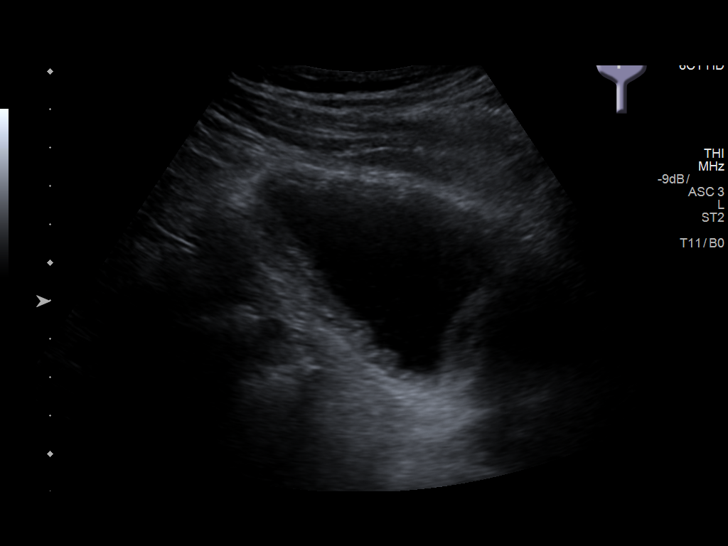
[im 31/34]
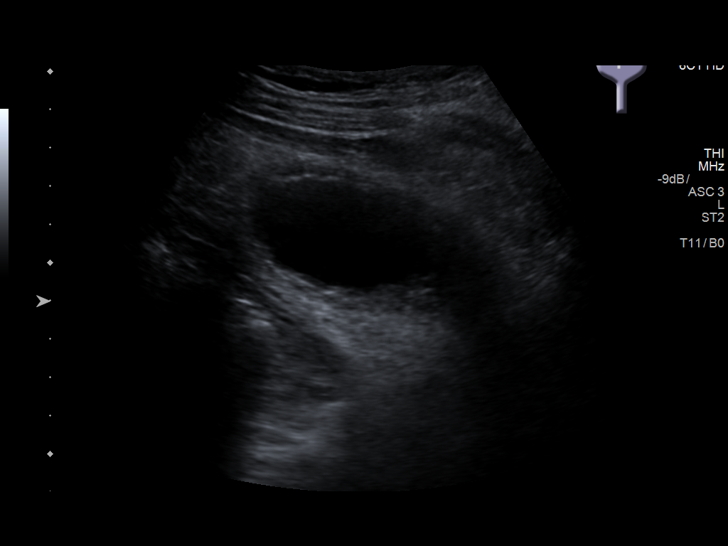
[im 34/34]
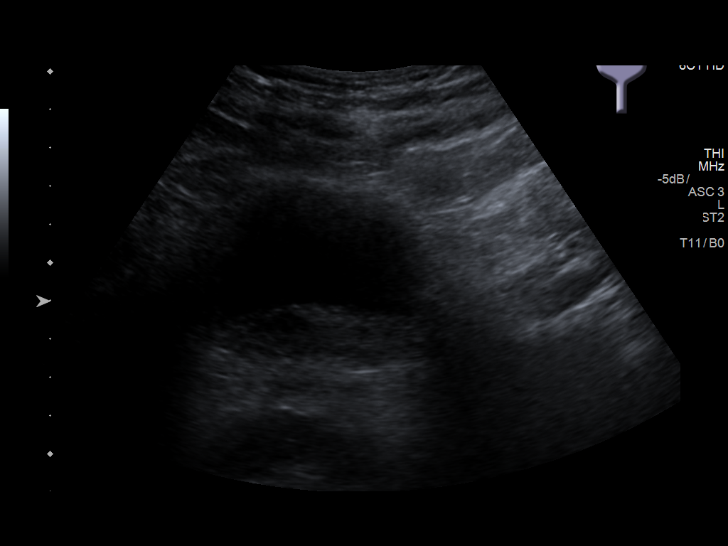

[14 of 25 positions shown; findings below may reference images not displayed]

FINDINGS: Right Kidney:

Length: 9.2 cm. Echogenicity within normal limits. No mass or
hydronephrosis visualized.

Left Kidney:

Length: 10.5 cm. Echogenicity within normal limits. No mass or
hydronephrosis visualized.

Bladder:

Bladder is decompressed with wall thickening similar to that seen on
prior CT examination.
IMPRESSION: Diffuse bladder wall thickening similar to that seen on prior CT
examination.

Normal-appearing kidneys.

## 2019-03-18 ENCOUNTER — Other Ambulatory Visit (INDEPENDENT_AMBULATORY_CARE_PROVIDER_SITE_OTHER): Payer: PPO

## 2019-03-18 ENCOUNTER — Other Ambulatory Visit: Payer: Self-pay

## 2019-03-18 ENCOUNTER — Encounter: Payer: Self-pay | Admitting: Family Medicine

## 2019-03-18 ENCOUNTER — Ambulatory Visit (INDEPENDENT_AMBULATORY_CARE_PROVIDER_SITE_OTHER): Payer: PPO | Admitting: Family Medicine

## 2019-03-18 DIAGNOSIS — R300 Dysuria: Secondary | ICD-10-CM

## 2019-03-18 DIAGNOSIS — C61 Malignant neoplasm of prostate: Secondary | ICD-10-CM | POA: Diagnosis not present

## 2019-03-18 LAB — POCT URINALYSIS DIPSTICK
Bilirubin, UA: NEGATIVE
Glucose, UA: POSITIVE — AB
Ketones, UA: NEGATIVE
Nitrite, UA: NEGATIVE
Protein, UA: NEGATIVE
Spec Grav, UA: 1.02 (ref 1.010–1.025)
Urobilinogen, UA: 0.2 E.U./dL
pH, UA: 5 (ref 5.0–8.0)

## 2019-03-18 LAB — URINALYSIS, MICROSCOPIC ONLY: RBC / HPF: NONE SEEN (ref 0–?)

## 2019-03-18 NOTE — Assessment & Plan Note (Signed)
Patient has intermittent issues with this.  Urinalysis and urine culture did not reveal a UTI though did reveal likely contamination.  He did have RBCs on urine micro.  Given persistent symptoms and possible contamination we will recheck his urine.  Orders have been placed.  He will be called by the CMA to let him know that he can come in today for these tests.  If negative for UTI he would need to see a urologist to complete evaluation for the RBCs and symptoms.

## 2019-03-18 NOTE — Assessment & Plan Note (Signed)
Patient will see his cancer physician as planned later this month.

## 2019-03-18 NOTE — Progress Notes (Signed)
Virtual Visit via telephone Note  This visit type was conducted due to national recommendations for restrictions regarding the COVID-19 pandemic (e.g. social distancing).  This format is felt to be most appropriate for this patient at this time.  All issues noted in this document were discussed and addressed.  No physical exam was performed (except for noted visual exam findings with Video Visits).   I connected with Matthew Miles today at  9:30 AM EST by telephone and verified that I am speaking with the correct person using two identifiers. Location patient: home Location provider: home office Persons participating in the virtual visit: patient, provider, Luxton Salonen (daughter)  I discussed the limitations, risks, security and privacy concerns of performing an evaluation and management service by telephone and the availability of in person appointments. I also discussed with the patient that there may be a patient responsible charge related to this service. The patient expressed understanding and agreed to proceed.  Interactive audio and video telecommunications were attempted between this provider and patient, however failed, due to patient having technical difficulties OR patient did not have access to video capability.  We continued and completed visit with audio only.   Reason for visit: same day   HPI: UTI:  Dysuria- yes, sometimes, not every day, has been going on for a couple weeks  Frequency- yes   Urgency- yes   Hematuria- no   Fever- no  Abd pain- no   Penile d/c- no Feels like he is emptying his bladder fully.  Urine has been cloudy some.  Burns at the end of urination.  He does have a history of prostate cancer and is potentially due for an injection related to this.  He has an upcoming appointment with cancer physician.  His daughter notes that the injections in the past have helped with the swelling in his prostate.  She wonders if some of this could just be  swelling in his prostate. Urinalysis with 7-10 RBCs.  Urine culture with mixed flora.  Suggestive of contamination.    ROS: See pertinent positives and negatives per HPI.  Past Medical History:  Diagnosis Date  . Allergy   . Arthritis   . CHF (congestive heart failure) ([place])   . Chicken pox   . Cholecystitis   . Colon polyps   . COPD (chronic obstructive pulmonary disease) ([place])   . Coronary artery disease   . Diabetes mellitus without complication ([place])   . Emphysema of lung ([place])   . GERD (gastroesophageal reflux disease)   . Heart murmur   . Hematemesis/vomiting blood 04/10/11  . Hypercholesterolemia   . Mild hypertension   . Pancreatitis   . Prostate cancer ([place])   . Prostate cancer [facility])    radiation therapy   . Smoker   . Ulcer   . Upper GI bleed 1980    Past Surgical History:  Procedure Laterality Date  . APPENDECTOMY    . CARDIAC CATHETERIZATION  May 2002 and Feb 2013   [facility]; no stents   . CATARACT EXTRACTION    . CHOLECYSTECTOMY    . CIRCUMCISION    . COLONOSCOPY    . HEMORRHOID SURGERY    . SP CHOLECYSTOMY    . STOMACH SURGERY     bleeding ulcers, followed by Dr. [name]    Family History  Problem Relation Age of Onset  . Prostate cancer Father   . Liver cancer Brother   . Prostate cancer Brother   . Emphysema Sister  smoker    SOCIAL HX: Former smoker   Current Outpatient Medications:  .  alfuzosin (UROXATRAL) 10 MG 24 hr tablet, Take 1 tablet (10 mg total) by mouth daily with breakfast., Disp: 30 tablet, Rfl: 0 .  aspirin 81 MG tablet, Take 81 mg by mouth daily. , Disp: , Rfl:  .  calcium-vitamin D 250-100 MG-UNIT tablet, Take 1 tablet by mouth 2 (two) times daily., Disp: , Rfl:  .  carvedilol (COREG) 6.25 MG tablet, TAKE 1 TABLET(6.25 MG) BY MOUTH TWICE DAILY, Disp: 180 tablet, Rfl: 2 .  Cyanocobalamin (B-12) 1000 MCG TBCR, Take 1 tablet by mouth daily. , Disp: , Rfl:  .  FEROSUL 325 (65 Fe) MG tablet, TAKE 1 TABLET(325 MG) BY MOUTH  TWICE DAILY WITH A MEAL, Disp: 60 tablet, Rfl: 1 .  FLUZONE HIGH-DOSE QUADRIVALENT 0.7 ML SUSY, , Disp: , Rfl:  .  glipiZIDE (GLUCOTROL XL) 10 MG 24 hr tablet, TAKE 1 TABLET(10 MG) BY MOUTH TWICE DAILY, Disp: 180 tablet, Rfl: 0 .  JANUVIA 50 MG tablet, TAKE 1 TABLET(50 MG) BY MOUTH DAILY, Disp: 90 tablet, Rfl: 1 .  JARDIANCE 10 MG TABS tablet, TAKE 1 TABLET BY MOUTH DAILY, Disp: 30 tablet, Rfl: 2 .  Leuprolide Acetate, 6 Month, (LUPRON) 45 MG injection, Inject into the muscle., Disp: , Rfl:  .  metFORMIN (GLUCOPHAGE-XR) 500 MG 24 hr tablet, TAKE 2 TABLETS(1000 MG) BY MOUTH TWICE DAILY, Disp: 360 tablet, Rfl: 3 .  nitroGLYCERIN (NITROSTAT) 0.4 MG SL tablet, Place 1 tablet (0.4 mg total) under the tongue every 5 (five) minutes as needed., Disp: 25 tablet, Rfl: 0 .  ONETOUCH VERIO test strip, USE AS DIRECTED TO CHECK BLOOD SUGAR TWICE DAILY, Disp: 200 each, Rfl: 2 .  pantoprazole (PROTONIX) 20 MG tablet, TAKE 1 TABLET(20 MG) BY MOUTH DAILY, Disp: 90 tablet, Rfl: 1 .  potassium chloride (KLOR-CON M10) 10 MEQ tablet, Take 1 tablet (10 mEq total) by mouth daily., Disp: 90 tablet, Rfl: 2 .  pravastatin (PRAVACHOL) 40 MG tablet, TAKE 1 TABLET(40 MG) BY MOUTH DAILY, Disp: 90 tablet, Rfl: 1 .  PROAIR HFA 108 (90 Base) MCG/ACT inhaler, INHALE 2 PUFFS INTO THE LUNGS EVERY 6 HOURS AS NEEDED FOR WHEEZING OR SHORTNESS OF BREATH, Disp: 8.5 g, Rfl: 0 .  Tiotropium Bromide Monohydrate (SPIRIVA RESPIMAT) 2.5 MCG/ACT AERS, Inhale 2 puffs into the lungs daily., Disp: 4 g, Rfl: 5 .  torsemide (DEMADEX) 10 MG tablet, TAKE 1 TABLET(10 MG) BY MOUTH TWICE DAILY AS NEEDED, Disp: 180 tablet, Rfl: 3 .  Zinc Oxide 13 % CREA, Apply as needed to the wound on your left buttocks, Disp: 113 g, Rfl: 1  EXAM: This is a telehealth telephone visit and thus no physical exam was completed.   ASSESSMENT AND PLAN:  Discussed the following assessment and plan:  Dysuria Patient has intermittent issues with this.  Urinalysis and urine  culture did not reveal a UTI though did reveal likely contamination.  He did have RBCs on urine micro.  Given persistent symptoms and possible contamination we will recheck his urine.  Orders have been placed.  He will be called by the CMA to let him know that he can come in today for these tests.  If negative for UTI he would need to see a urologist to complete evaluation for the RBCs and symptoms.  Prostate cancer [facility]) Patient will see his cancer physician as planned later this month.    I discussed the assessment and treatment plan with  the patient. The patient was provided an opportunity to ask questions and all were answered. The patient agreed with the plan and demonstrated an understanding of the instructions.   The patient was advised to call back or seek an in-person evaluation if the symptoms worsen or if the condition fails to improve as anticipated.  I provided 22 minutes of non-face-to-face time during this encounter.   [name], MD

## 2019-03-19 ENCOUNTER — Ambulatory Visit: Payer: PPO | Admitting: Internal Medicine

## 2019-03-19 ENCOUNTER — Other Ambulatory Visit (INDEPENDENT_AMBULATORY_CARE_PROVIDER_SITE_OTHER): Payer: PPO

## 2019-03-19 ENCOUNTER — Telehealth: Payer: Self-pay | Admitting: Family Medicine

## 2019-03-19 DIAGNOSIS — D509 Iron deficiency anemia, unspecified: Secondary | ICD-10-CM

## 2019-03-19 LAB — URINE CULTURE
MICRO NUMBER:: 1170693
SPECIMEN QUALITY:: ADEQUATE

## 2019-03-19 LAB — FECAL OCCULT BLOOD, IMMUNOCHEMICAL: Fecal Occult Bld: NEGATIVE

## 2019-03-19 NOTE — Telephone Encounter (Signed)
I called and spoke with the daughter and informed her of the stool results and she stated that her fathre just told her last night that he is red,and irritated on the tip of his penis, that may be where the blood comes in he may have a yeast infection and the urologist had gave him cream before, she wanted you to know that.  Laverne Hursey,cma

## 2019-03-19 NOTE — Telephone Encounter (Signed)
Patient daughter [name] called and would like to speak with Dr. [name] CMA about the urine sample from yesterday. She would like to tell her the reason they think patient needed the urine sample. Please call her back, thanks.

## 2019-03-20 ENCOUNTER — Ambulatory Visit: Payer: PPO | Admitting: Family Medicine

## 2019-03-20 ENCOUNTER — Other Ambulatory Visit: Payer: Self-pay | Admitting: Family Medicine

## 2019-03-20 MED ORDER — CLOTRIMAZOLE 1 % EX CREA: 1.0000 "application " | TOPICAL_CREAM | Freq: Two times a day (BID) | CUTANEOUS | 0 refills | Status: DC

## 2019-03-20 NOTE — Telephone Encounter (Signed)
See result note.  

## 2019-03-22 ENCOUNTER — Ambulatory Visit (INDEPENDENT_AMBULATORY_CARE_PROVIDER_SITE_OTHER): Payer: PPO | Admitting: Orthotics

## 2019-03-22 ENCOUNTER — Other Ambulatory Visit: Payer: Self-pay

## 2019-03-22 DIAGNOSIS — M2042 Other hammer toe(s) (acquired), left foot: Secondary | ICD-10-CM

## 2019-03-22 DIAGNOSIS — E1142 Type 2 diabetes mellitus with diabetic polyneuropathy: Secondary | ICD-10-CM | POA: Diagnosis not present

## 2019-03-22 DIAGNOSIS — M2041 Other hammer toe(s) (acquired), right foot: Secondary | ICD-10-CM

## 2019-03-22 DIAGNOSIS — L89891 Pressure ulcer of other site, stage 1: Secondary | ICD-10-CM

## 2019-03-25 ENCOUNTER — Telehealth: Payer: Self-pay

## 2019-03-25 NOTE — Telephone Encounter (Signed)
Copied from [place] [phone]. Topic: General - Inquiry >> Mar 25, 2019  1:39 PM Virl Axe D wrote: Reason for CRM: Pt's daughter stated pt's cancer doctor is requesting the appt notes from pt's most recent visit and all lab results from December to be faxed over before pt's appt on 03/29/19 Fax [phone] Attn: Dr. Aline Brochure

## 2019-03-27 NOTE — Telephone Encounter (Signed)
I faxed the last office visit and the December to Dr. Legrand Como [facility]'s [facility] at [facility] Per Patient request. Today.   Lum Stillinger,cma

## 2019-03-29 ENCOUNTER — Other Ambulatory Visit: Payer: Self-pay | Admitting: Family Medicine

## 2019-03-29 DIAGNOSIS — C61 Malignant neoplasm of prostate: Secondary | ICD-10-CM | POA: Diagnosis not present

## 2019-03-29 DIAGNOSIS — C9110 Chronic lymphocytic leukemia of B-cell type not having achieved remission: Secondary | ICD-10-CM | POA: Diagnosis not present

## 2019-03-29 DIAGNOSIS — R972 Elevated prostate specific antigen [PSA]: Secondary | ICD-10-CM | POA: Diagnosis not present

## 2019-03-29 DIAGNOSIS — Z923 Personal history of irradiation: Secondary | ICD-10-CM | POA: Diagnosis not present

## 2019-03-29 DIAGNOSIS — R399 Unspecified symptoms and signs involving the genitourinary system: Secondary | ICD-10-CM | POA: Diagnosis not present

## 2019-03-29 DIAGNOSIS — R590 Localized enlarged lymph nodes: Secondary | ICD-10-CM | POA: Diagnosis not present

## 2019-03-29 DIAGNOSIS — Z79899 Other long term (current) drug therapy: Secondary | ICD-10-CM | POA: Diagnosis not present

## 2019-03-29 DIAGNOSIS — Z79818 Long term (current) use of other agents affecting estrogen receptors and estrogen levels: Secondary | ICD-10-CM | POA: Diagnosis not present

## 2019-04-07 ENCOUNTER — Other Ambulatory Visit: Payer: Self-pay | Admitting: Family Medicine

## 2019-04-23 ENCOUNTER — Other Ambulatory Visit: Payer: Self-pay | Admitting: Family Medicine

## 2019-04-29 ENCOUNTER — Other Ambulatory Visit: Payer: Self-pay

## 2019-04-29 ENCOUNTER — Ambulatory Visit: Payer: PPO | Admitting: Cardiovascular Disease

## 2019-04-29 ENCOUNTER — Ambulatory Visit (INDEPENDENT_AMBULATORY_CARE_PROVIDER_SITE_OTHER): Payer: Medicare HMO | Admitting: Family Medicine

## 2019-04-29 VITALS — Ht 62.0 in | Wt 178.6 lb

## 2019-04-29 DIAGNOSIS — E119 Type 2 diabetes mellitus without complications: Secondary | ICD-10-CM | POA: Diagnosis not present

## 2019-04-29 DIAGNOSIS — E1165 Type 2 diabetes mellitus with hyperglycemia: Secondary | ICD-10-CM | POA: Diagnosis not present

## 2019-04-29 DIAGNOSIS — N401 Enlarged prostate with lower urinary tract symptoms: Secondary | ICD-10-CM | POA: Diagnosis not present

## 2019-04-29 DIAGNOSIS — J449 Chronic obstructive pulmonary disease, unspecified: Secondary | ICD-10-CM

## 2019-04-29 DIAGNOSIS — I10 Essential (primary) hypertension: Secondary | ICD-10-CM | POA: Diagnosis not present

## 2019-04-29 MED ORDER — CARVEDILOL 6.25 MG PO TABS: ORAL_TABLET | ORAL | 2 refills | Status: DC

## 2019-04-29 MED ORDER — GLIPIZIDE ER 10 MG PO TB24: ORAL_TABLET | ORAL | 1 refills | Status: DC

## 2019-04-29 MED ORDER — METFORMIN HCL ER 500 MG PO TB24: ORAL_TABLET | ORAL | 3 refills | Status: DC

## 2019-04-29 MED ORDER — JARDIANCE 10 MG PO TABS: 10.0000 mg | ORAL_TABLET | Freq: Every day | ORAL | 2 refills | Status: DC

## 2019-04-29 MED ORDER — PRAVASTATIN SODIUM 40 MG PO TABS: ORAL_TABLET | ORAL | 1 refills | Status: DC

## 2019-04-29 MED ORDER — PANTOPRAZOLE SODIUM 20 MG PO TBEC: DELAYED_RELEASE_TABLET | ORAL | 1 refills | Status: DC

## 2019-04-29 MED ORDER — SITAGLIPTIN PHOSPHATE 50 MG PO TABS: ORAL_TABLET | ORAL | 1 refills | Status: DC

## 2019-04-29 MED ORDER — TORSEMIDE 10 MG PO TABS: ORAL_TABLET | ORAL | 3 refills | Status: DC

## 2019-04-29 MED ORDER — SPIRIVA RESPIMAT 2.5 MCG/ACT IN AERS: 2.0000 | INHALATION_SPRAY | Freq: Every day | RESPIRATORY_TRACT | 5 refills | Status: DC

## 2019-04-29 NOTE — Assessment & Plan Note (Signed)
CBGs seem to be well controlled.  We will have him into the office in 1 month to check an A1c.

## 2019-04-29 NOTE — Assessment & Plan Note (Signed)
Well-controlled.  Continue Spiriva.

## 2019-04-29 NOTE — Assessment & Plan Note (Signed)
He will see urology as planned.  He can continue Uroxatrol for now.

## 2019-04-29 NOTE — Assessment & Plan Note (Signed)
Previously adequately controlled.  Continue current regimen.

## 2019-04-29 NOTE — Progress Notes (Signed)
Virtual Visit via telephone Note  This visit type was conducted due to national recommendations for restrictions regarding the COVID-19 pandemic (e.g. social distancing).  This format is felt to be most appropriate for this patient at this time.  All issues noted in this document were discussed and addressed.  No physical exam was performed (except for noted visual exam findings with Video Visits).   I connected with Matthew Miles today at  2:15 PM EST by telephone and verified that I am speaking with the correct person using two identifiers. Location patient: home Location provider: work or home office Persons participating in the virtual visit: patient, provider  I discussed the limitations, risks, security and privacy concerns of performing an evaluation and management service by telephone and the availability of in person appointments. I also discussed with the patient that there may be a patient responsible charge related to this service. The patient expressed understanding and agreed to proceed.  Interactive audio and video telecommunications were attempted between this provider and patient, however failed, due to patient having technical difficulties OR patient did not have access to video capability.  We continued and completed visit with audio only.   Reason for visit: follow-up  HPI: Hypertension: Not checking blood pressures.  Taking carvedilol and torsemide.  No chest pain or shortness of breath.  Diabetes: Notes his CBGs are running in the 120s-140s.  Taking Jardiance, Januvia, and Metformin.  No polyuria or polydipsia.  No hypoglycemia.  COPD: No cough or wheezing.  He takes the Spiriva most days.  Some days he forgets.  Rare albuterol use.  BPH: Does note some slow urinary stream.  He is on Uroxatrol which provides some benefit.  He does get up 4-5 times a night.  He does have a urology appointment at [facility] in the near future.   ROS: See pertinent positives and negatives  per HPI.  Past Medical History:  Diagnosis Date  . Allergy   . Arthritis   . CHF (congestive heart failure) ([place])   . Chicken pox   . Cholecystitis   . Colon polyps   . COPD (chronic obstructive pulmonary disease) ([place])   . Coronary artery disease   . Diabetes mellitus without complication ([place])   . Emphysema of lung ([place])   . GERD (gastroesophageal reflux disease)   . Heart murmur   . Hematemesis/vomiting blood 04/10/11  . Hypercholesterolemia   . Mild hypertension   . Pancreatitis   . Prostate cancer ([place])   . Prostate cancer [facility])    radiation therapy   . Smoker   . Ulcer   . Upper GI bleed 1980    Past Surgical History:  Procedure Laterality Date  . APPENDECTOMY    . CARDIAC CATHETERIZATION  May 2002 and Feb 2013   [facility]; no stents   . CATARACT EXTRACTION    . CHOLECYSTECTOMY    . CIRCUMCISION    . COLONOSCOPY    . HEMORRHOID SURGERY    . SP CHOLECYSTOMY    . STOMACH SURGERY     bleeding ulcers, followed by Dr. [name]    Family History  Problem Relation Age of Onset  . Prostate cancer Father   . Liver cancer Brother   . Prostate cancer Brother   . Emphysema Sister        smoker    SOCIAL HX: Former smoker   Current Outpatient Medications:  .  alfuzosin (UROXATRAL) 10 MG 24 hr tablet, Take 1 tablet (10 mg total) by  mouth daily with breakfast., Disp: 30 tablet, Rfl: 0 .  aspirin 81 MG tablet, Take 81 mg by mouth daily. , Disp: , Rfl:  .  calcium-vitamin D 250-100 MG-UNIT tablet, Take 1 tablet by mouth 2 (two) times daily., Disp: , Rfl:  .  carvedilol (COREG) 6.25 MG tablet, TAKE 1 TABLET(6.25 MG) BY MOUTH TWICE DAILY, Disp: 180 tablet, Rfl: 2 .  clotrimazole (LOTRIMIN) 1 % cream, Apply 1 application topically 2 (two) times daily., Disp: 30 g, Rfl: 0 .  Cyanocobalamin (B-12) 1000 MCG TBCR, Take 1 tablet by mouth daily. , Disp: , Rfl:  .  empagliflozin (JARDIANCE) 10 MG TABS tablet, Take 10 mg by mouth daily., Disp: 90 tablet, Rfl: 2 .  FEROSUL 325 (65  Fe) MG tablet, TAKE 1 TABLET(325 MG) BY MOUTH TWICE DAILY WITH A MEAL, Disp: 60 tablet, Rfl: 1 .  FLUZONE HIGH-DOSE QUADRIVALENT 0.7 ML SUSY, , Disp: , Rfl:  .  glipiZIDE (GLUCOTROL XL) 10 MG 24 hr tablet, TAKE 1 TABLET(10 MG) BY MOUTH TWICE DAILY, Disp: 180 tablet, Rfl: 1 .  Leuprolide Acetate, 6 Month, (LUPRON) 45 MG injection, Inject into the muscle., Disp: , Rfl:  .  metFORMIN (GLUCOPHAGE-XR) 500 MG 24 hr tablet, TAKE 2 TABLETS(1000 MG) BY MOUTH TWICE DAILY, Disp: 360 tablet, Rfl: 3 .  nitroGLYCERIN (NITROSTAT) 0.4 MG SL tablet, Place 1 tablet (0.4 mg total) under the tongue every 5 (five) minutes as needed., Disp: 25 tablet, Rfl: 0 .  ONETOUCH VERIO test strip, USE AS DIRECTED TO CHECK BLOOD SUGAR TWICE DAILY, Disp: 200 strip, Rfl: 1 .  pantoprazole (PROTONIX) 20 MG tablet, TAKE 1 TABLET(20 MG) BY MOUTH DAILY, Disp: 90 tablet, Rfl: 1 .  potassium chloride (KLOR-CON M10) 10 MEQ tablet, Take 1 tablet (10 mEq total) by mouth daily., Disp: 90 tablet, Rfl: 2 .  pravastatin (PRAVACHOL) 40 MG tablet, TAKE 1 TABLET(40 MG) BY MOUTH DAILY, Disp: 90 tablet, Rfl: 1 .  PROAIR HFA 108 (90 Base) MCG/ACT inhaler, INHALE 2 PUFFS INTO THE LUNGS EVERY 6 HOURS AS NEEDED FOR WHEEZING OR SHORTNESS OF BREATH, Disp: 8.5 g, Rfl: 0 .  sitaGLIPtin (JANUVIA) 50 MG tablet, TAKE 1 TABLET(50 MG) BY MOUTH DAILY, Disp: 90 tablet, Rfl: 1 .  Tiotropium Bromide Monohydrate (SPIRIVA RESPIMAT) 2.5 MCG/ACT AERS, Inhale 2 puffs into the lungs daily., Disp: 4 g, Rfl: 5 .  torsemide (DEMADEX) 10 MG tablet, TAKE 1 TABLET(10 MG) BY MOUTH TWICE DAILY AS NEEDED, Disp: 180 tablet, Rfl: 3 .  Zinc Oxide 13 % CREA, Apply as needed to the wound on your left buttocks, Disp: 113 g, Rfl: 1  EXAM: This is a telehealth telephone visit and thus no physical exam was completed.  ASSESSMENT AND PLAN:  Discussed the following assessment and plan:  Essential hypertension Previously adequately controlled.  Continue current regimen.  COPD (chronic  obstructive pulmonary disease) ([place]) Well-controlled.  Continue Spiriva.  Diabetes ([place]) CBGs seem to be well controlled.  We will have him into the office in 1 month to check an A1c.  BPH (benign prostatic hyperplasia) He will see urology as planned.  He can continue Uroxatrol for now.   Orders Placed This Encounter  Procedures  . POCT HgB A1C    Standing Status:   Future    Standing Expiration Date:   06/27/2019    Meds ordered this encounter  Medications  . torsemide (DEMADEX) 10 MG tablet    Sig: TAKE 1 TABLET(10 MG) BY MOUTH TWICE DAILY AS NEEDED  Dispense:  180 tablet    Refill:  3  . Tiotropium Bromide Monohydrate (SPIRIVA RESPIMAT) 2.5 MCG/ACT AERS    Sig: Inhale 2 puffs into the lungs daily.    Dispense:  4 g    Refill:  5  . pravastatin (PRAVACHOL) 40 MG tablet    Sig: TAKE 1 TABLET(40 MG) BY MOUTH DAILY    Dispense:  90 tablet    Refill:  1  . pantoprazole (PROTONIX) 20 MG tablet    Sig: TAKE 1 TABLET(20 MG) BY MOUTH DAILY    Dispense:  90 tablet    Refill:  1  . metFORMIN (GLUCOPHAGE-XR) 500 MG 24 hr tablet    Sig: TAKE 2 TABLETS(1000 MG) BY MOUTH TWICE DAILY    Dispense:  360 tablet    Refill:  3  . empagliflozin (JARDIANCE) 10 MG TABS tablet    Sig: Take 10 mg by mouth daily.    Dispense:  90 tablet    Refill:  2  . sitaGLIPtin (JANUVIA) 50 MG tablet    Sig: TAKE 1 TABLET(50 MG) BY MOUTH DAILY    Dispense:  90 tablet    Refill:  1  . glipiZIDE (GLUCOTROL XL) 10 MG 24 hr tablet    Sig: TAKE 1 TABLET(10 MG) BY MOUTH TWICE DAILY    Dispense:  180 tablet    Refill:  1  . carvedilol (COREG) 6.25 MG tablet    Sig: TAKE 1 TABLET(6.25 MG) BY MOUTH TWICE DAILY    Dispense:  180 tablet    Refill:  2     I discussed the assessment and treatment plan with the patient. The patient was provided an opportunity to ask questions and all were answered. The patient agreed with the plan and demonstrated an understanding of the instructions.   The patient was  advised to call back or seek an in-person evaluation if the symptoms worsen or if the condition fails to improve as anticipated.  I provided 13 minutes of non-face-to-face time during this encounter.   [name], MD

## 2019-05-04 ENCOUNTER — Other Ambulatory Visit: Payer: Self-pay | Admitting: Family Medicine

## 2019-05-04 DIAGNOSIS — J449 Chronic obstructive pulmonary disease, unspecified: Secondary | ICD-10-CM

## 2019-05-05 NOTE — Progress Notes (Addendum)
cardiology Office Note  Date:  05/06/2019   ID:  Matthew Miles., DOB 1928-11-18, MRN [id number]  PCP:  Matthew Haven, MD   Chief Complaint  Patient presents with  . Follow-up    6 month follow up. Meds reviewed by the pt. verbally. "doing well."     HPI:  [name] is a 86 yo-old gentleman with history of  DM II, HBA1C 7.5 coronary artery disease (occluded LAD),  moderate pulmonary hypertension in early 2013,  long history of smoking for 60 years, stopped more than 6 years ago COPD bronchitis requiring several courses of antibiotics at the end of 2012,  December for hematemesis and gastric ulcer seen on EGD,  nonsustained VT per the notes,  acute renal failure  Ejection fraction 55% in 2015 who presents for followup Of his coronary artery disease  No SOB, no chest pain Uses an inhaler once a day, during the day No exercise Goes shopping  Dizzy spells resolved, Had low BP before, resolved  chronic mild leg edema, nonpitting  Taking torsemide 10 BID Scheduled to see [facility]rology at [facility], difficulty urinating, incontinence  Labs reviewed 01/2019 CR 1.46, BUN 20 HCT 34.7 HBA1C 7.8 Total chol 104, LDL 51  in12/2019  EKG personally reviewed by myself on todays visit Shows normal sinus rhythm rate 76 bpm left anterior fascicular block, old anterior MI  Other past medical history reviewed He reports that he was recently In the hospital, 07/30/2016 GERD symptoms Ate cabbage that day Better with GI cocktail,Ruled out for [facility] records reviewed with the patient in detail Changed to protonix At discharge    Lab work reviewed with him Total chol 130s, LDL 80  Chronic low back pain Reports that he had a bleeding ulcer 4 years ago at that time stop smoking Last echocardiogram in 2015 showing normal ejection fraction, right heart pressures 37 mmHg  left and right heart cath done in February 2013 showing pulmonary hypertension, stable severe coronary  artery disease  Normal ejection fraction  Right heart catheterization showed significant fluid overload with wedge pressure of 24, moderate pulmonary hypertension. Catheterization showed occluded LAD which was old, no other significant stenoses requiring intervention.   PMH:   has a past medical history of Allergy, Arthritis, CHF (congestive heart failure) ([place]), Chicken pox, Cholecystitis, Colon polyps, COPD (chronic obstructive pulmonary disease) ([place]), Coronary artery disease, Diabetes mellitus without complication (Matthew Miles), Emphysema of lung (Matthew Miles), GERD (gastroesophageal reflux disease), Heart murmur, Hematemesis/vomiting blood (04/10/11), Hypercholesterolemia, Mild hypertension, Pancreatitis, Prostate cancer (Matthew Miles), Prostate cancer ([place]), Smoker, Ulcer, and Upper GI bleed (1980).  PSH:    Past Surgical History:  Procedure Laterality Date  . APPENDECTOMY    . CARDIAC CATHETERIZATION  May 2002 and Feb 2013   [facility]; no stents   . CATARACT EXTRACTION    . CHOLECYSTECTOMY    . CIRCUMCISION    . COLONOSCOPY    . HEMORRHOID SURGERY    . SP CHOLECYSTOMY    . STOMACH SURGERY     bleeding ulcers, followed by Dr. Tiffany Miles    Current Outpatient Medications  Medication Sig Dispense Refill  . alfuzosin (UROXATRAL) 10 MG 24 hr tablet Take 1 tablet (10 mg total) by mouth daily with breakfast. 30 tablet 0  . aspirin 81 MG tablet Take 81 mg by mouth daily.     . calcium-vitamin D 250-100 MG-UNIT tablet Take 1 tablet by mouth 2 (two) times daily.    . carvedilol (COREG) 6.25 MG tablet TAKE 1  TABLET(6.25 MG) BY MOUTH TWICE DAILY 180 tablet 2  . clotrimazole (LOTRIMIN) 1 % cream Apply 1 application topically 2 (two) times daily. 30 g 0  . Cyanocobalamin (B-12) 1000 MCG TBCR Take 1 tablet by mouth daily.     . empagliflozin (JARDIANCE) 10 MG TABS tablet Take 10 mg by mouth daily. 90 tablet 2  . FEROSUL 325 (65 Fe) MG tablet TAKE 1 TABLET(325 MG) BY MOUTH TWICE DAILY WITH A MEAL 60 tablet 1  . FLUZONE  HIGH-DOSE QUADRIVALENT 0.7 ML SUSY     . glipiZIDE (GLUCOTROL XL) 10 MG 24 hr tablet TAKE 1 TABLET(10 MG) BY MOUTH TWICE DAILY 180 tablet 1  . Leuprolide Acetate, 6 Month, (LUPRON) 45 MG injection Inject into the muscle.    . metFORMIN (GLUCOPHAGE-XR) 500 MG 24 hr tablet TAKE 2 TABLETS(1000 MG) BY MOUTH TWICE DAILY 360 tablet 3  . nitroGLYCERIN (NITROSTAT) 0.4 MG SL tablet Place 1 tablet (0.4 mg total) under the tongue every 5 (five) minutes as needed. 25 tablet 0  . ONETOUCH VERIO test strip USE AS DIRECTED TO CHECK BLOOD SUGAR TWICE DAILY 200 strip 1  . pantoprazole (PROTONIX) 20 MG tablet TAKE 1 TABLET(20 MG) BY MOUTH DAILY 90 tablet 1  . potassium chloride (KLOR-CON M10) 10 MEQ tablet Take 1 tablet (10 mEq total) by mouth daily. 90 tablet 2  . pravastatin (PRAVACHOL) 40 MG tablet TAKE 1 TABLET(40 MG) BY MOUTH DAILY 90 tablet 1  . PROAIR HFA 108 (90 Base) MCG/ACT inhaler INHALE 2 PUFFS INTO THE LUNGS EVERY 6 HOURS AS NEEDED FOR WHEEZING OR SHORTNESS OF BREATH 8.5 g 0  . sitaGLIPtin (JANUVIA) 50 MG tablet TAKE 1 TABLET(50 MG) BY MOUTH DAILY 90 tablet 1  . SPIRIVA RESPIMAT 2.5 MCG/ACT AERS INHALE 2 PUFFS INTO THE LUNGS DAILY 4 g 5  . torsemide (DEMADEX) 10 MG tablet TAKE 1 TABLET(10 MG) BY MOUTH TWICE DAILY AS NEEDED 180 tablet 3  . Zinc Oxide 13 % CREA Apply as needed to the wound on your left buttocks 113 g 1   No current facility-administered medications for this visit.     Allergies:   Sulfa antibiotics and Tradjenta [linagliptin]   Social History:  The patient  reports that he quit smoking about 8 years ago. His smoking use included cigarettes. He has a 65.00 pack-year smoking history. He has quit using smokeless tobacco.  His smokeless tobacco use included chew. He reports current alcohol use. He reports that he does not use drugs.   Family History:   family history includes Emphysema in his sister; Liver cancer in his brother; Prostate cancer in his brother and father.    Review of  Systems: Review of Systems  Constitutional: Negative.   Respiratory: Negative.   Gastrointestinal: Negative.   Musculoskeletal: Positive for falls and joint pain.  Neurological: Negative.   Psychiatric/Behavioral: Negative.   All other systems reviewed and are negative.   PHYSICAL EXAM: VS:  BP (!) 130/58 (BP Location: Left Arm, Patient Position: Sitting, Cuff Size: Normal)   Pulse 76   Ht 5\' 3"  (1.6 m)   Wt 184 lb 8 oz (83.7 kg)   BMI 32.68 kg/m  , BMI Body mass index is 32.68 kg/m. Constitutional:  oriented to person, place, and time. No distress.  HENT:  Head: Grossly normal Eyes:  no discharge. No scleral icterus.  Neck: No JVD, no carotid bruits  Cardiovascular: Regular rate and rhythm, 2/6 SEm LSB Pulmonary/Chest: Clear to auscultation bilaterally, no wheezes or  rails Abdominal: Soft.  no distension.  no tenderness.  Musculoskeletal: Normal range of motion Neurological:  normal muscle tone. Coordination normal. No atrophy Skin: Skin warm and dry Psychiatric: normal affect, pleasant   Recent Labs: 10/29/2018: ALT 11 01/16/2019: BUN 20; Creatinine, Ser 1.46; Hemoglobin 11.2; Platelets 100.0; Potassium 4.9; Sodium 138    Lipid Panel Lab Results  Component Value Date   CHOL 104 03/16/2018   HDL 28.50 (L) 03/16/2018   LDLCALC 51 03/16/2018   TRIG 118.0 03/16/2018    Wt Readings from Last 3 Encounters:  05/06/19 184 lb 8 oz (83.7 kg)  04/29/19 178 lb 9.6 oz (81 kg)  03/18/19 181 lb (82.1 kg)     ASSESSMENT AND PLAN:  Pulmonary hypertension ([place]) -  Last echo 10/2013, ejection fraction greater than 55% Mildly elevated right heart pressures, on torsemide Stable, denies SOB  Chronic diastolic CHF (congestive heart failure) ([facility]) - Stay bon torsemide 10 BID  Essential hypertension - Plan: EKG 12-Lead BP stable, stay on torsemide 10 BID On coreg BID  Coronary artery disease involving native coronary artery of native heart without angina pectoris - Plan: EKG  12-Lead Currently with no symptoms of angina. No further workup at this time. Continue current medication regimen.  Mixed hyperlipidemia - Plan: EKG 12-Lead No recent lipids  Uncontrolled type 2 diabetes mellitus with other circulatory complication, without long-term current use of insulin ([place]) - We have encouraged continued exercise, careful diet management in an effort to lose weight.  Chronic obstructive pulmonary disease, unspecified COPD type ([place])  On inhaler daily       Total encounter time more than 25 minutes  Greater than 50% was spent in counseling and coordination of care with the patient   Disposition:   F/U  6 month    Orders Placed This Encounter  Procedures  . EKG 12-Lead     Signed, Esmond Plants, M.D., Ph.D. 05/06/2019  [place], [place]

## 2019-05-06 ENCOUNTER — Ambulatory Visit (INDEPENDENT_AMBULATORY_CARE_PROVIDER_SITE_OTHER): Payer: Medicare HMO | Admitting: Cardiovascular Disease

## 2019-05-06 ENCOUNTER — Other Ambulatory Visit: Payer: Self-pay

## 2019-05-06 ENCOUNTER — Encounter: Payer: Self-pay | Admitting: Cardiovascular Disease

## 2019-05-06 VITALS — BP 130/58 | HR 76 | Ht 63.0 in | Wt 184.5 lb

## 2019-05-06 DIAGNOSIS — I272 Pulmonary hypertension, unspecified: Secondary | ICD-10-CM

## 2019-05-06 DIAGNOSIS — I5032 Chronic diastolic (congestive) heart failure: Secondary | ICD-10-CM

## 2019-05-06 DIAGNOSIS — I10 Essential (primary) hypertension: Secondary | ICD-10-CM | POA: Diagnosis not present

## 2019-05-06 DIAGNOSIS — J432 Centrilobular emphysema: Secondary | ICD-10-CM | POA: Diagnosis not present

## 2019-05-06 DIAGNOSIS — E1059 Type 1 diabetes mellitus with other circulatory complications: Secondary | ICD-10-CM | POA: Diagnosis not present

## 2019-05-06 DIAGNOSIS — E782 Mixed hyperlipidemia: Secondary | ICD-10-CM | POA: Diagnosis not present

## 2019-05-06 DIAGNOSIS — I25118 Atherosclerotic heart disease of native coronary artery with other forms of angina pectoris: Secondary | ICD-10-CM

## 2019-05-06 NOTE — Addendum Note (Signed)
Addended by: Minna Merritts on: 05/06/2019 01:36 PM   Modules accepted: Level of Service

## 2019-05-06 NOTE — Patient Instructions (Signed)

## 2019-05-07 ENCOUNTER — Other Ambulatory Visit: Payer: Self-pay | Admitting: Family Medicine

## 2019-05-27 ENCOUNTER — Other Ambulatory Visit (INDEPENDENT_AMBULATORY_CARE_PROVIDER_SITE_OTHER): Payer: Medicare HMO

## 2019-05-27 ENCOUNTER — Other Ambulatory Visit: Payer: Self-pay

## 2019-05-27 ENCOUNTER — Other Ambulatory Visit: Payer: Medicare HMO

## 2019-05-27 DIAGNOSIS — E119 Type 2 diabetes mellitus without complications: Secondary | ICD-10-CM

## 2019-05-27 LAB — POCT GLYCOSYLATED HEMOGLOBIN (HGB A1C): Hemoglobin A1C: 8.2 % — AB (ref 4.0–5.6)

## 2019-05-31 ENCOUNTER — Other Ambulatory Visit: Payer: Self-pay | Admitting: Family Medicine

## 2019-05-31 DIAGNOSIS — E119 Type 2 diabetes mellitus without complications: Secondary | ICD-10-CM

## 2019-06-03 ENCOUNTER — Telehealth: Payer: Self-pay | Admitting: Family Medicine

## 2019-06-03 NOTE — Chronic Care Management (AMB) (Signed)
  Chronic Care Management   Outreach Note  06/03/2019 Name: [name] MRN: [id number] DOB: June 22, 1928  Matthew Miles. is a 86 y.o. year old male who is a primary care patient of [name], [name], MD. I reached out to Matthew Miles. by phone today in response to a referral sent by [name]'s PCP, Dr. [name]     An unsuccessful telephone outreach was attempted today. The patient was referred to the case management team for assistance with care management and care coordination.   Follow Up Plan: A HIPPA compliant phone message was left for the patient providing contact information and requesting a return call.  The care management team will reach out to the patient again over the next 7 days.  If patient returns call to provider office, please advise to call Embedded Care Management Care Guide Glenna Durand LPN  at [phone]  Cari Vandeberg, LPN Health Advisor, [facility] [facility] ??[email or web address] ??[phone]

## 2019-06-03 NOTE — Chronic Care Management (AMB) (Signed)
  Chronic Care Management   Note  06/03/2019 Name: Nollie Terlizzi. MRN: [id number] DOB: 10-Jul-1928  [name] is a 86 y.o. year old male who is a primary care patient of [name], [name], MD. I reached out to [name] by phone today in response to a referral sent by Mr. Tresten Pantoja Jr.'s PCP, Dr. [name]     Mr. Henery was given information about Chronic Care Management services today including:  1. CCM service includes personalized support from designated clinical staff supervised by his physician, including individualized plan of care and coordination with other care providers 2. 24/7 contact phone numbers for assistance for urgent and routine care needs. 3. Service will only be billed when office clinical staff spend 20 minutes or more in a month to coordinate care. 4. Only one practitioner may furnish and bill the service in a calendar month. 5. The patient may stop CCM services at any time (effective at the end of the month) by phone call to the office staff. 6. The patient will be responsible for cost sharing (co-pay) of up to 20% of the service fee (after annual deductible is met).  Patient agreed to services and verbal consent obtained.   Follow up plan: Telephone appointment with care management team member scheduled for:07/15/2019  Glenna Durand, [place] [facility] ??[email or web address] ??[phone]

## 2019-06-06 ENCOUNTER — Other Ambulatory Visit: Payer: Self-pay | Admitting: Family Medicine

## 2019-06-13 NOTE — Progress Notes (Signed)

## 2019-06-26 ENCOUNTER — Encounter (INDEPENDENT_AMBULATORY_CARE_PROVIDER_SITE_OTHER): Payer: Self-pay

## 2019-06-26 ENCOUNTER — Ambulatory Visit: Payer: PPO | Admitting: Family Medicine

## 2019-06-26 ENCOUNTER — Other Ambulatory Visit: Payer: Self-pay

## 2019-06-26 ENCOUNTER — Ambulatory Visit (INDEPENDENT_AMBULATORY_CARE_PROVIDER_SITE_OTHER): Payer: Medicare HMO

## 2019-06-26 VITALS — Ht 63.0 in | Wt 184.0 lb

## 2019-06-26 DIAGNOSIS — Z0000 Encounter for general adult medical examination without abnormal findings: Secondary | ICD-10-CM | POA: Diagnosis not present

## 2019-06-26 NOTE — Progress Notes (Signed)
I have reviewed the above note and agree.  Kaimani Clayson, M.D.  

## 2019-06-26 NOTE — Progress Notes (Signed)
Subjective:   Matthew Miles. is a 86 y.o. male who presents for Medicare Annual/Subsequent preventive examination.  Review of Systems:  No ROS.  Medicare Wellness Virtual Visit.  Visual/audio telehealth visit, UTA vital signs.   See social history for additional risk factors.   Cardiac Risk Factors include: advanced age (>79men, >56 women);male gender;hypertension;diabetes mellitus     Objective:    Vitals: Ht 5\' 3"  (1.6 m)   Wt 184 lb (83.5 kg)   BMI 32.59 kg/m   Body mass index is 32.59 kg/m.  Advanced Directives 06/26/2019 06/25/2018 04/05/2018 02/21/2018 08/22/2017 06/23/2017 02/21/2017  Does Patient Have a Medical Advance Directive? Yes Yes Yes Yes No;Yes Yes No  Type of Paramedic of [place];Living will [place];Living will [place];Living will [place];Living will [place];Living will [place] -  Does patient want to make changes to medical advance directive? No - Patient declined No - Patient declined Yes (Inpatient - patient requests chaplain consult to change a medical advance directive) - - No - Patient declined -  Copy of [place] in Chart? No - copy requested No - copy requested No - copy requested - No - copy requested No - copy requested -  Would patient like information on creating a medical advance directive? - - - - - - -    Tobacco Social History   Tobacco Use  Smoking Status Former Smoker  . Packs/day: 1.00  . Years: 65.00  . Pack years: 65.00  . Types: Cigarettes  . Quit date: 04/10/2011  . Years since quitting: 8.2  Smokeless Tobacco Former Systems developer  . Types: Chew     Counseling given: Not Answered   Clinical Intake:  Pre-visit preparation completed: Yes        Diabetes: Yes(Follwed by pcp)  How often do you need to have someone help you when you read instructions, pamphlets, or other written  materials from your doctor or pharmacy?: 1 - Never  Interpreter Needed?: No     Past Medical History:  Diagnosis Date  . Allergy   . Arthritis   . CHF (congestive heart failure) ([place])   . Chicken pox   . Cholecystitis   . Colon polyps   . COPD (chronic obstructive pulmonary disease) ([place])   . Coronary artery disease   . Diabetes mellitus without complication ([place])   . Emphysema of lung ([place])   . GERD (gastroesophageal reflux disease)   . Heart murmur   . Hematemesis/vomiting blood 04/10/11  . Hypercholesterolemia   . Mild hypertension   . Pancreatitis   . Prostate cancer ([place])   . Prostate cancer [facility])    radiation therapy   . Smoker   . Ulcer   . Upper GI bleed 1980   Past Surgical History:  Procedure Laterality Date  . APPENDECTOMY    . CARDIAC CATHETERIZATION  May 2002 and Feb 2013   [facility]; no stents   . CATARACT EXTRACTION    . CHOLECYSTECTOMY    . CIRCUMCISION    . COLONOSCOPY    . HEMORRHOID SURGERY    . SP CHOLECYSTOMY    . STOMACH SURGERY     bleeding ulcers, followed by Dr. [name]   Family History  Problem Relation Age of Onset  . Prostate cancer Father   . Liver cancer Brother   . Prostate cancer Brother   . Emphysema Sister  smoker   Social History   Socioeconomic History  . Marital status: Widowed    Spouse name: Not on file  . Number of children: 2  . Years of education: Not on file  . Highest education level: Not on file  Occupational History  . 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 Smoking status: Former Smoker    Packs/day: 1.00    Years: 65.00    Pack years: 65.00    Types: Cigarettes    Quit date: 04/10/2011    Years since quitting: 8.2  . Smokeless tobacco: Former Systems developer    Types: Chew  Substance and Sexual Activity  . Alcohol use: Yes    Comment: 1 beer rarely  . Drug use: No  . Sexual activity: Not Currently  Other Topics Concern  . Not on file  Social History  Narrative   Lives in [place] alone. Wife in nursing home. Has 2 children.      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 Difficulty of Paying Living Expenses: Not hard at all  Food Insecurity:   . Worried About Charity fundraiser in the Last Year:   . Arboriculturist in the Last Year:   Transportation Needs: No Transportation Needs  . Lack of Transportation (Medical): No  . Lack of Transportation (Non-Medical): No  Physical Activity: Insufficiently Active  . Days of Exercise per Week: 3 days  . Minutes of Exercise per Session: 10 min  Stress:   . Feeling of Stress :   Social Connections: Unknown  . Frequency of Communication with Friends and Family: More than three times a week  . Frequency of Social Gatherings with Friends and Family: More than three times a week  . Attends Religious Services: Not on file  . Active Member of Clubs or Organizations: Yes  . Attends Archivist Meetings: Not on file  . Marital Status: Widowed    Outpatient Encounter Medications as of 06/26/2019  Medication Sig  . alfuzosin (UROXATRAL) 10 MG 24 hr tablet Take 1 tablet (10 mg total) by mouth daily with breakfast.  . aspirin 81 MG tablet Take 81 mg by mouth daily.   . calcium-vitamin D 250-100 MG-UNIT tablet Take 1 tablet by mouth 2 (two) times daily.  . carvedilol (COREG) 6.25 MG tablet TAKE 1 TABLET(6.25 MG) BY MOUTH TWICE DAILY  . clotrimazole (LOTRIMIN) 1 % cream Apply 1 application topically 2 (two) times daily.  . Cyanocobalamin (B-12) 1000 MCG TBCR Take 1 tablet by mouth daily.   . empagliflozin (JARDIANCE) 10 MG TABS tablet Take 10 mg by mouth daily.  . FEROSUL 325 (65 Fe) MG tablet TAKE 1 TABLET(325 MG) BY MOUTH TWICE DAILY WITH A MEAL  . FLUZONE HIGH-DOSE QUADRIVALENT 0.7 ML SUSY   . glipiZIDE (GLUCOTROL XL) 10 MG 24 hr tablet TAKE 1 TABLET(10 MG) BY MOUTH TWICE  DAILY  . Leuprolide Acetate, 6 Month, (LUPRON) 45 MG injection Inject into the muscle.  . metFORMIN (GLUCOPHAGE-XR) 500 MG 24 hr tablet TAKE 2 TABLETS(1000 MG) BY MOUTH TWICE DAILY  . nitroGLYCERIN (NITROSTAT) 0.4 MG SL tablet Place 1 tablet (0.4 mg total) under the tongue every 5 (five) minutes as needed.  Glory Rosebush VERIO test strip USE AS DIRECTED TO CHECK BLOOD  SUGAR TWICE DAILY  . pantoprazole (PROTONIX) 20 MG tablet TAKE 1 TABLET(20 MG) BY MOUTH DAILY  . potassium chloride (KLOR-CON M10) 10 MEQ tablet Take 1 tablet (10 mEq total) by mouth daily.  . pravastatin (PRAVACHOL) 40 MG tablet TAKE 1 TABLET(40 MG) BY MOUTH DAILY  . PROAIR HFA 108 (90 Base) MCG/ACT inhaler INHALE 2 PUFFS INTO THE LUNGS EVERY 6 HOURS AS NEEDED FOR WHEEZING OR SHORTNESS OF BREATH  . sitaGLIPtin (JANUVIA) 50 MG tablet TAKE 1 TABLET(50 MG) BY MOUTH DAILY  . SPIRIVA RESPIMAT 2.5 MCG/ACT AERS INHALE 2 PUFFS INTO THE LUNGS DAILY  . torsemide (DEMADEX) 10 MG tablet TAKE 1 TABLET(10 MG) BY MOUTH TWICE DAILY AS NEEDED  . Zinc Oxide 13 % CREA Apply as needed to the wound on your left buttocks   No facility-administered encounter medications on file as of 06/26/2019.    Activities of Daily Living In your present state of health, do you have any difficulty performing the following activities: 06/26/2019  Hearing? Y  Comment Hearing aid  Vision? N  Difficulty concentrating or making decisions? N  Walking or climbing stairs? Y  Comment Unsteady gait; cane in use  Dressing or bathing? N  Doing errands, shopping? Y  Comment Patient only drives 2 destinations local alone  Preparing Food and eating ? N  Using the Toilet? N  In the past six months, have you accidently leaked urine? N  Do you have problems with loss of bowel control? N  Managing your Medications? N  Managing your Finances? N  Housekeeping or managing your Housekeeping? N  Some recent data might be hidden    Patient Care Team: [name], MD as  PCP - General (Family Medicine) Rockey Situ, Kathlene November, MD as Consulting Physician (Cardiology) De Hollingshead, [facility] as Pharmacist (Pharmacist)   Assessment:   This is a routine wellness examination for [place].  Nurse connected with patient 06/26/19 at 10:30 AM EDT by a telephone enabled telemedicine application and verified that I am speaking with the correct person using two identifiers. Patient stated full name and DOB. Patient gave permission to continue with virtual visit. Patient's location was at home and Nurse's location was at [place] office.   Patient is alert and oriented x3. Patient denies difficulty focusing or concentrating. Patient likes to complete puzzle books read and cooks for brain health.  Health Maintenance Due: -Covid vaccine- series completed. He plans to update the office once information is recovered from his son.  -POCT Hgb A1c- 05/27/19 (8.2) See completed HM at the end of note.   Eye: Visual acuity not assessed. Virtual visit. Followed by their ophthalmologist. Retinopathy- none reported.  Dental: Dentures- yes  Hearing: Demonstrates normal hearing during visit. Hearing aids- yes  Safety:  Patient feels safe at home- yes Patient does have smoke detectors at home- yes Patient does wear sunscreen or protective clothing when in direct sunlight - yes Patient does wear seat belt when in a moving vehicle - yes Patient drives- yes, 2 destinations local only Adequate lighting in walkways free from debris- yes Grab bars and handrails used as appropriate- yes Ambulates with an assistive device- yes; cane in use Cell phone on person when ambulating outside of the home- yes  Social: Alcohol intake - yes      Smoking history- former  Smokers in home? none Illicit drug use? none  Medication: Taking as directed and without issues.  Pill box in use -yes  Self managed - yes   Covid-19:  Precautions and sickness symptoms discussed. Wears mask, social  distancing, hand hygiene as appropriate.   Activities of Daily Living Patient denies needing assistance with: household chores, feeding themselves, getting from bed to chair, getting to the toilet, bathing/showering, dressing, managing money, or preparing meals.   Discussed the importance of a healthy diet, water intake and the benefits of aerobic exercise.  Physical activity- walking as tolerated. Peddler in use 3 times weekly about 10 minutes.  Diet:  Low carb Water: good intake  Other Providers Patient Care Team: [name], MD as PCP - General (Family Medicine) Rockey Situ, Kathlene November, MD as Consulting Physician (Cardiology) De Hollingshead, [facility] as Pharmacist (Pharmacist)  Exercise Activities and Dietary recommendations Current Exercise Habits: Home exercise routine, Type of exercise: calisthenics;walking(peddler), Time (Minutes): 10, Frequency (Times/Week): 3, Weekly Exercise (Minutes/Week): 30, Intensity: Mild  Goals    . Healthy Lifestyle     Increase bicycling as tolerated Manage blood sugar Healthy diet Stay hydrated       Fall Risk Fall Risk  06/26/2019 03/18/2019 01/23/2019 01/16/2019 10/29/2018  Falls in the past year? 0 0 0 0 0  Number falls in past yr: - 0 0 0 -  Injury with Fall? - - - - -  Follow up Falls evaluation completed Falls evaluation completed Falls evaluation completed Falls evaluation completed -   Timed Get Up and Go Performed: no, virtual visit  Depression Screen PHQ 2/9 Scores 06/26/2019 03/18/2019 01/23/2019 01/16/2019  PHQ - 2 Score 0 0 0 0    Cognitive Function MMSE - Mini Mental State Exam 06/22/2016 06/23/2015  Orientation to time 5 5  Orientation to Place 5 5  Registration 3 3  Attention/ Calculation 5 5  Recall 3 3  Language- name 2 objects 2 2  Language- repeat 1 1  Language- follow 3 step command 3 3  Language- read & follow direction 1 1  Write a sentence 1 1  Copy design 1 1  Total score 30 30     6CIT Screen 06/26/2019  06/25/2018 06/23/2017  What Year? 0 points 0 points 0 points  What month? 0 points 0 points 0 points  What time? 0 points 0 points 0 points  Count back from 20 0 points 0 points 0 points  Months in reverse 0 points 0 points 0 points  Repeat phrase - 0 points 0 points  Total Score - 0 0    Immunization History  Administered Date(s) Administered  . Influenza Split 12/29/2011  . Influenza, High Dose Seasonal PF 12/24/2015, 01/14/2017, 01/31/2018, 01/05/2019  . Influenza,inj,Quad PF,6+ Mos 01/10/2013, 01/17/2014, 01/22/2015  . Influenza-Unspecified 12/20/2011  . PFIZER SARS-COV-2 Vaccination 05/21/2019  . Pneumococcal Conjugate-13 04/21/2014  . Pneumococcal Polysaccharide-23 12/29/2011  . Tdap 04/07/2011   Screening Tests Health Maintenance  Topic Date Due  . OPHTHALMOLOGY EXAM  10/02/2019  . HEMOGLOBIN A1C  11/24/2019  . URINE MICROALBUMIN  12/03/2019  . FOOT EXAM  12/31/2019  . TETANUS/TDAP  04/06/2021  . INFLUENZA VACCINE  Completed  . PNA vac Low Risk Adult  Completed      Plan:   Keep all routine maintenance appointments.   Follow up 07/15/19 with CCM.  Follow up 08/27/19 @ 11:00 with your doctor.  Medicare Attestation I have personally reviewed: The patient's medical and social history Their use of alcohol, tobacco or illicit drugs Their current medications and supplements The patient's functional ability including ADLs,fall risks, home safety risks, cognitive, and hearing and visual impairment Diet and physical activities  Evidence for depression   I have reviewed and discussed with patient certain preventive protocols, quality metrics, and best practice recommendations.   Varney Biles, LPN  [phone]

## 2019-06-26 NOTE — Patient Instructions (Addendum)
  [name] , Thank you for taking time to come for your Medicare Wellness Visit. I appreciate your ongoing commitment to your health goals. Please review the following plan we discussed and let me know if I can assist you in the future.   These are the goals we discussed: Goals    . Healthy Lifestyle     Increase bicycling as tolerated Manage blood sugar Healthy diet Stay hydrated       This is a list of the screening recommended for you and due dates:  Health Maintenance  Topic Date Due  . Eye exam for diabetics  10/02/2019  . Hemoglobin A1C  11/24/2019  . Urine Protein Check  12/03/2019  . Complete foot exam   12/31/2019  . Tetanus Vaccine  04/06/2021  . Flu Shot  Completed  . Pneumonia vaccines  Completed

## 2019-06-28 DIAGNOSIS — Z79899 Other long term (current) drug therapy: Secondary | ICD-10-CM | POA: Diagnosis not present

## 2019-06-28 DIAGNOSIS — R3915 Urgency of urination: Secondary | ICD-10-CM | POA: Diagnosis not present

## 2019-06-28 DIAGNOSIS — R161 Splenomegaly, not elsewhere classified: Secondary | ICD-10-CM | POA: Diagnosis not present

## 2019-06-28 DIAGNOSIS — Z79818 Long term (current) use of other agents affecting estrogen receptors and estrogen levels: Secondary | ICD-10-CM | POA: Diagnosis not present

## 2019-06-28 DIAGNOSIS — R399 Unspecified symptoms and signs involving the genitourinary system: Secondary | ICD-10-CM | POA: Insufficient documentation

## 2019-06-28 DIAGNOSIS — R3914 Feeling of incomplete bladder emptying: Secondary | ICD-10-CM | POA: Diagnosis not present

## 2019-06-28 DIAGNOSIS — R350 Frequency of micturition: Secondary | ICD-10-CM | POA: Diagnosis not present

## 2019-06-28 DIAGNOSIS — D649 Anemia, unspecified: Secondary | ICD-10-CM | POA: Diagnosis not present

## 2019-06-28 DIAGNOSIS — C8307 Small cell B-cell lymphoma, spleen: Secondary | ICD-10-CM | POA: Diagnosis not present

## 2019-06-28 DIAGNOSIS — R351 Nocturia: Secondary | ICD-10-CM | POA: Diagnosis not present

## 2019-06-28 DIAGNOSIS — J449 Chronic obstructive pulmonary disease, unspecified: Secondary | ICD-10-CM | POA: Diagnosis not present

## 2019-06-28 DIAGNOSIS — Z9189 Other specified personal risk factors, not elsewhere classified: Secondary | ICD-10-CM | POA: Diagnosis not present

## 2019-06-28 DIAGNOSIS — C61 Malignant neoplasm of prostate: Secondary | ICD-10-CM | POA: Diagnosis not present

## 2019-07-06 ENCOUNTER — Other Ambulatory Visit: Payer: Self-pay | Admitting: Cardiovascular Disease

## 2019-07-15 ENCOUNTER — Ambulatory Visit (INDEPENDENT_AMBULATORY_CARE_PROVIDER_SITE_OTHER): Payer: Medicare HMO | Admitting: Pharmacist

## 2019-07-15 DIAGNOSIS — E119 Type 2 diabetes mellitus without complications: Secondary | ICD-10-CM | POA: Diagnosis not present

## 2019-07-15 DIAGNOSIS — E782 Mixed hyperlipidemia: Secondary | ICD-10-CM | POA: Diagnosis not present

## 2019-07-15 MED ORDER — TRULICITY 0.75 MG/0.5ML ~~LOC~~ SOAJ: 0.7500 mg | SUBCUTANEOUS | 2 refills | Status: DC

## 2019-07-15 NOTE — Chronic Care Management (AMB) (Signed)
Chronic Care Management   Note  07/15/2019 Name: Matthew Miles. MRN: [id number] DOB: 04-30-1928   Subjective:  Matthew Miles. is a 86 y.o. year old male who is a primary care patient of [name], [name], MD. The CCM team was consulted for assistance with chronic disease management and care coordination needs.    Contacted patient for medication management review. Also spoke with his daughter, [name].   Review of patient status, including review of consultants reports, laboratory and other test data, was performed as part of comprehensive evaluation and provision of chronic care management services.   SDOH (Social Determinants of Health) assessments and interventions performed:  no  Objective:  Lab Results  Component Value Date   CREATININE 1.46 01/16/2019   CREATININE 1.54 (H) 10/29/2018   CREATININE 1.48 (H) 12/12/2017    Lab Results  Component Value Date   HGBA1C 8.2 (A) 05/27/2019       Component Value Date/Time   CHOL 104 03/16/2018 1354   TRIG 118.0 03/16/2018 1354   HDL 28.50 (L) 03/16/2018 1354   CHOLHDL 4 03/16/2018 1354   VLDL 23.6 03/16/2018 1354   LDLCALC 51 03/16/2018 1354   LDLDIRECT 80.0 08/05/2015 1405    Clinical ASCVD: No  The ASCVD Risk score (Goff DC Jr., et al., 2013) failed to calculate for the following reasons:   The 2013 ASCVD risk score is only valid for ages 14 to 45    BP Readings from Last 3 Encounters:  05/06/19 (!) 130/58  01/23/19 90/70  01/16/19 (!) 140/50    Allergies  Allergen Reactions  . Sulfa Antibiotics     GI upset  . Tradjenta [Linagliptin] Other (See Comments)    Hair loss    Medications Reviewed Today    Reviewed by De Hollingshead, [facility] (Pharmacist) on 07/15/19 at 1407  Med List Status: <None>  Medication Order Taking? Sig Documenting Provider Last Dose Status Informant  alfuzosin (UROXATRAL) 10 MG 24 hr tablet [id number] Yes Take 1 tablet (10 mg total) by mouth daily with breakfast. Zara Council A,  PA-C Taking Active   aspirin 81 MG tablet [id number] Yes Take 81 mg by mouth daily.  [provider] Taking Active Self  calcium-vitamin D 250-100 MG-UNIT tablet [id number] Yes Take 1 tablet by mouth 2 (two) times daily. [provider] Taking Active   carvedilol (COREG) 6.25 MG tablet [id number] Yes TAKE 1 TABLET(6.25 MG) BY MOUTH TWICE DAILY [name], MD Taking Active   clotrimazole (LOTRIMIN) 1 % cream [id number]  Apply 1 application topically 2 (two) times daily. [name], MD  Active   Cyanocobalamin (B-12) 1000 MCG TBCR [id number] Yes Take 1 tablet by mouth daily.  [provider] Taking Active Self  empagliflozin (JARDIANCE) 10 MG TABS tablet [id number] Yes Take 10 mg by mouth daily. [name], MD Taking Active   FEROSUL 325 (65 Fe) MG tablet [id number] Yes TAKE 1 TABLET(325 MG) BY MOUTH TWICE DAILY WITH A MEAL Einar Pheasant, MD Taking Active   glipiZIDE (GLUCOTROL XL) 10 MG 24 hr tablet [id number] Yes TAKE 1 TABLET(10 MG) BY MOUTH TWICE DAILY [name], MD Taking Active         Discontinued 07/15/19 1407 (Completed Course)   metFORMIN (GLUCOPHAGE-XR) 500 MG 24 hr tablet [id number] Yes TAKE 2 TABLETS(1000 MG) BY MOUTH TWICE DAILY [name], MD Taking Active   nitroGLYCERIN (NITROSTAT) 0.4 MG SL tablet [id number]  Place 1 tablet (0.4 mg total) under the tongue  every 5 (five) minutes as needed. [name], MD  Active Self  [facility] VERIO test strip [id number] Yes USE AS DIRECTED TO CHECK BLOOD SUGAR TWICE DAILY [name], MD Taking Active   oxybutynin (DITROPAN) 5 MG tablet [id number] Yes Take 5 mg by mouth 2 (two) times daily.  [provider] Taking Active   pantoprazole (PROTONIX) 20 MG tablet [id number] Yes TAKE 1 TABLET(20 MG) BY MOUTH DAILY [name], MD Taking Active   potassium chloride (KLOR-CON) 10 MEQ tablet [id number] Yes TAKE 1 TABLET(10 MEQ) BY MOUTH DAILY Rockey Situ Kathlene November, MD Taking  Active   pravastatin (PRAVACHOL) 40 MG tablet [id number] Yes TAKE 1 TABLET(40 MG) BY MOUTH DAILY [name], MD Taking Active   PROAIR HFA 108 [phone] Base) MCG/ACT inhaler [id number] Yes INHALE 2 PUFFS INTO THE LUNGS EVERY 6 HOURS AS NEEDED FOR WHEEZING OR SHORTNESS OF BREATH [name], MD Taking Active   sitaGLIPtin (JANUVIA) 50 MG tablet [id number] Yes TAKE 1 TABLET(50 MG) BY MOUTH DAILY [name], MD Taking Active   SPIRIVA RESPIMAT 2.5 MCG/ACT AERS [id number] Yes INHALE 2 PUFFS INTO THE LUNGS DAILY [name], MD Taking Active   torsemide (DEMADEX) 10 MG tablet [id number] Yes TAKE 1 TABLET(10 MG) BY MOUTH TWICE DAILY AS NEEDED [name], MD Taking Active   Zinc Oxide 13 % CREA [id number]  Apply as needed to the wound on your left buttocks [name], MD  Active            Assessment:   Goals Addressed            This Visit's Progress     Patient Stated   . PharmD "I want to work on my sugars" (pt-stated)       [place] (see longtitudinal plan of care for additional care plan information)  Current Barriers:  . Diabetes: uncontrolled; complicated by chronic medical conditions including HTN, HLD, hx prostate cancer, most recent A1c 8.2% . Most recent eGFR: ~49 mL/min . Current antihyperglycemic regimen: metformin XR 1000 mg BID, Jardiance 10 mg daily, Januvia 50 mg daily, glipizide XL 10 mg daily (prescribed BID but only taking daily) . Denies hypoglycemic symptoms, including dizziness, lightheadedness, shaking, sweating . Reports hyperglycemic symptoms, including nocturia, but also has BPH and is taking torsemide dose w/ supper . Current meal patterns: o Breakfast: Consulting civil engineer Sausage biscuits;  o Has been cooking mostly in his [employer] to cut grease o Snacks: Strawberries, Apples, Blueberries o Drinks: Water, Zero sugar [employer] . Current blood glucose readings:  o Fasting: 113; 120-130s; hardly ever over 140s o Not checking  post prandials  . Cardiovascular risk reduction (follows w/ [name]) o Current hypertensive regimen: Carvedilol 6.25 mg BID, torsemide 10 mg BID o Current hyperlipidemia regimen: pravastatin 40 mg daily; last LDL at goal <70 o Current antiplatelet regimen: ASA 81 mg daily  . Hx prostate cancer: follows w/ [name]; alfuzosin 10 mg daily, just started on oxybutynin 5 mg BID; reports significant dry mouth since starting oxybutynin . COPD: Spiriva 2.5/2.5 mcg 2 puffs daily; very infrequent need for PRN albuteorl  Pharmacist Clinical Goal(s):  Marland Kitchen Over the next 90 days, patient will work with PharmD and primary care provider to address optimized medication management  Interventions: . Comprehensive medication review performed, medication list updated in electronic medical record . Counseled that dry mouth is a common side effect of antimuscarinic medications (particularly IR oxybutynin). Encouraged adherence, but  to discuss this side effect w/ urology moving forward to determine alternative options w/ lower anticholinergic burden . Counseled that he could take second torsemide dose in the afternoon, as opposed to at bedtime, to reduce polyuria impact . Reviewed goal A1c, goal fasting glucose, goal 2 hour post prandial glucose.  Marland Kitchen Discussed mechanism and benefit of antihyperglycemic medications. Discussed benefit of GLP1. Patient and patient's daughter in agreement. Start Trulicity 6.28 mg once weekly. Stop Januvia. Discussed future goal of d/c glipizide.  . They will ask [facility] pharmacist to demonstrate administration technique, but if they still have questions, they will call me and we will set up appointment with me or RN visit for administration demonstration. . Encouraged to check fasting and 2 hour post prandial glucose readings. . Extensive discussion of appropriate carbohydrate serving sizes. Mailing planning healthy meals handout to patient and daughter.   Patient Self Care  Activities:  . Patient will check blood glucose BID, document, and provide at future appointments . Patient will take medications as prescribed . Patient will contact provider with any episodes of hypoglycemia . Patient will report any questions or concerns to provider   Initial goal documentation        Plan: - Scheduled f/u call 09/16/19  Catie Darnelle Maffucci, PharmD, BCACP, [place] Pharmacist [place] [phone]

## 2019-07-15 NOTE — Patient Instructions (Addendum)
Mr. Hyppolite,   It was great talking with you today!  1) Start Trulicity 1.61 mg once weekly. STOP Januvia. Continue metformin 1000 mg twice daily, Jardiance 10 mg daily, and glipizide 10 mg daily (for now).  2) Check your blood sugars twice daily - 1) fasting, before eating anything and 2) 2 hours after supper. Please write down these readings for [place] to review at our next phone appointment.   For a goal A1c of <7%, we would like to see all fasting readings less than 130 and all after meal readings less than 180.   If you start to have episodes of low blood sugars (readings <80 OR you feel dizzy, shaky, or lightheaded), please call me.   Enclosed is the [employer]. Please look at the last page- these are all of the things that can raise your blood sugars, and the appropriate portion sizes.   These are your medications:  Morning: - Alfuzosin 10 mg (prostate) - Oxybutynin 5 mg (prostate) - Carvedilol 6.25 mg (heart, blood pressure) - Torsemide 10 mg (fluid removal)  - Metformin 1000 mg (blood sugar) - Jardiance 10 mg (blood sugar) - Glipizide 10 mg (blood sugar) - Potassium 10 mEq (fluid pills can lower potassium levels)  Afternoon: - Torsemide (fluid removal)  Evening: - Carvedilol 6.25 mg (heart, blood pressure) - Metformin 1000 mg (blood sugar) - Oxybutynin 5 mg (prostate) - Pravastatin 40 mg daily (cholesterol) - Pantoprazole 20 mg (acid reflux) - Ferrous sulfate 325 mg (iron supplement)  Spiriva daily - breathing; albuterol as needed for shortness of breath  Trulicity weekly - diabetes   Call with any questions!  Catie Darnelle Maffucci, PharmD, [place]  Visit Information  Goals Addressed            This Visit's Progress     Patient Stated   . PharmD "I want to work on my sugars" (pt-stated)       [place] (see longtitudinal plan of care for additional care plan information)  Current Barriers:  . Diabetes: uncontrolled;  complicated by chronic medical conditions including HTN, HLD, hx prostate cancer, most recent A1c 8.2% . Most recent eGFR: ~49 mL/min . Current antihyperglycemic regimen: metformin XR 1000 mg BID, Jardiance 10 mg daily, Januvia 50 mg daily, glipizide XL 10 mg daily (prescribed BID but only taking daily) . Denies hypoglycemic symptoms, including dizziness, lightheadedness, shaking, sweating . Reports hyperglycemic symptoms, including nocturia, but also has BPH and is taking torsemide dose w/ supper . Current meal patterns: o Breakfast: Consulting civil engineer Sausage biscuits;  o Has been cooking mostly in his [employer] to cut grease o Snacks: Strawberries, Apples, Blueberries o Drinks: Water, Zero sugar [employer] . Current blood glucose readings:  o Fasting: 113; 120-130s; hardly ever over 140s o Not checking post prandials  . Cardiovascular risk reduction (follows w/ [name]) o Current hypertensive regimen: Carvedilol 6.25 mg BID, torsemide 10 mg BID o Current hyperlipidemia regimen: pravastatin 40 mg daily; last LDL at goal <70 o Current antiplatelet regimen: ASA 81 mg daily  . Hx prostate cancer: follows w/ [name]; alfuzosin 10 mg daily, just started on oxybutynin 5 mg BID; reports significant dry mouth since starting oxybutynin . COPD: Spiriva 2.5/2.5 mcg 2 puffs daily; very infrequent need for PRN albuteorl  Pharmacist Clinical Goal(s):  Marland Kitchen Over the next 90 days, patient will work with PharmD and primary care provider to address optimized medication management  Interventions: . Comprehensive medication review performed, medication list  updated in electronic medical record . Counseled that dry mouth is a common side effect of antimuscarinic medications (particularly IR oxybutynin). Encouraged adherence, but to discuss this side effect w/ urology moving forward to determine alternative options w/ lower anticholinergic burden . Counseled that he could take second torsemide dose in the afternoon,  as opposed to at bedtime, to reduce polyuria impact . Reviewed goal A1c, goal fasting glucose, goal 2 hour post prandial glucose.  Marland Kitchen Discussed mechanism and benefit of antihyperglycemic medications. Discussed benefit of GLP1. Patient and patient's daughter in agreement. Start Trulicity 4.98 mg once weekly. Stop Januvia. Discussed future goal of d/c glipizide.  . They will ask [facility] pharmacist to demonstrate administration technique, but if they still have questions, they will call me and we will set up appointment with me or RN visit for administration demonstration. . Encouraged to check fasting and 2 hour post prandial glucose readings. . Extensive discussion of appropriate carbohydrate serving sizes. Mailing planning healthy meals handout to patient and daughter.   Patient Self Care Activities:  . Patient will check blood glucose BID, document, and provide at future appointments . Patient will take medications as prescribed . Patient will contact provider with any episodes of hypoglycemia . Patient will report any questions or concerns to provider   Initial goal documentation         The patient verbalized understanding of instructions provided today and agreed to receive a mailed copy of patient instruction and/or educational materials.  Plan: - Scheduled f/u call 09/16/19  Catie Darnelle Maffucci, PharmD, BCACP, CPP Clinical Pharmacist [place] [phone]

## 2019-07-18 ENCOUNTER — Ambulatory Visit: Payer: Self-pay | Admitting: Pharmacist

## 2019-07-18 DIAGNOSIS — E119 Type 2 diabetes mellitus without complications: Secondary | ICD-10-CM

## 2019-07-18 NOTE — Chronic Care Management (AMB) (Signed)
Chronic Care Management   Follow Up Note   07/18/2019 Name: Matthew Miles. MRN: [id number] DOB: [birth year]  Referred by: [name], MD Reason for referral : Chronic Care Management (Medication Management)   Matthew Miles. is a 86 y.o. year old male who is a primary care patient of [name], [name], MD. The CCM team was consulted for assistance with chronic disease management and care coordination needs.    Received call from patient's daughter with questions.   Review of patient status, including review of consultants reports, relevant laboratory and other test results, and collaboration with appropriate care team members and the patient's provider was performed as part of comprehensive patient evaluation and provision of chronic care management services.    SDOH (Social Determinants of Health) assessments performed: No See Care Plan activities for detailed interventions related to [facility])     Outpatient Encounter Medications as of 07/18/2019  Medication Sig  . alfuzosin (UROXATRAL) 10 MG 24 hr tablet Take 1 tablet (10 mg total) by mouth daily with breakfast.  . aspirin 81 MG tablet Take 81 mg by mouth daily.   . calcium-vitamin D 250-100 MG-UNIT tablet Take 1 tablet by mouth 2 (two) times daily.  . carvedilol (COREG) 6.25 MG tablet TAKE 1 TABLET(6.25 MG) BY MOUTH TWICE DAILY  . clotrimazole (LOTRIMIN) 1 % cream Apply 1 application topically 2 (two) times daily.  . Cyanocobalamin (B-12) 1000 MCG TBCR Take 1 tablet by mouth daily.   . Dulaglutide (TRULICITY) 1.77 LT/9.0ZE SOPN Inject 0.75 mg into the skin once a week.  . empagliflozin (JARDIANCE) 10 MG TABS tablet Take 10 mg by mouth daily.  . FEROSUL 325 (65 Fe) MG tablet TAKE 1 TABLET(325 MG) BY MOUTH TWICE DAILY WITH A MEAL  . glipiZIDE (GLUCOTROL XL) 10 MG 24 hr tablet TAKE 1 TABLET(10 MG) BY MOUTH TWICE DAILY  . metFORMIN (GLUCOPHAGE-XR) 500 MG 24 hr tablet TAKE 2 TABLETS(1000 MG) BY MOUTH TWICE DAILY  .  nitroGLYCERIN (NITROSTAT) 0.4 MG SL tablet Place 1 tablet (0.4 mg total) under the tongue every 5 (five) minutes as needed.  Glory Rosebush VERIO test strip USE AS DIRECTED TO CHECK BLOOD SUGAR TWICE DAILY  . oxybutynin (DITROPAN) 5 MG tablet Take 5 mg by mouth 2 (two) times daily.   . pantoprazole (PROTONIX) 20 MG tablet TAKE 1 TABLET(20 MG) BY MOUTH DAILY  . potassium chloride (KLOR-CON) 10 MEQ tablet TAKE 1 TABLET(10 MEQ) BY MOUTH DAILY  . pravastatin (PRAVACHOL) 40 MG tablet TAKE 1 TABLET(40 MG) BY MOUTH DAILY  . PROAIR HFA 108 (90 Base) MCG/ACT inhaler INHALE 2 PUFFS INTO THE LUNGS EVERY 6 HOURS AS NEEDED FOR WHEEZING OR SHORTNESS OF BREATH  . SPIRIVA RESPIMAT 2.5 MCG/ACT AERS INHALE 2 PUFFS INTO THE LUNGS DAILY  . torsemide (DEMADEX) 10 MG tablet TAKE 1 TABLET(10 MG) BY MOUTH TWICE DAILY AS NEEDED  . Zinc Oxide 13 % CREA Apply as needed to the wound on your left buttocks   No facility-administered encounter medications on file as of 07/18/2019.     Objective:   Goals Addressed            This Visit's Progress     Patient Stated   . PharmD "I want to work on my sugars" (pt-stated)       [place] (see longtitudinal plan of care for additional care plan information)  Current Barriers:  . Diabetes: uncontrolled; complicated by chronic medical conditions including HTN, HLD, hx prostate cancer, most  recent A1c 8.2% . Most recent eGFR: ~49 mL/min . Current antihyperglycemic regimen: metformin XR 1000 mg BID, Jardiance 10 mg daily, glipizide XL 10 mg daily (prescribed BID but only taking daily); Trulicity 2.26 mg weekly - is picking up today o Daughter calls today asking about how to dial up the dose of the medication, as she has a family friend that accidentally dialed up too much insulin and went into a coma.  . Cardiovascular risk reduction (follows w/ [name]) o Current hypertensive regimen: Carvedilol 6.25 mg BID, torsemide 10 mg BID o Current hyperlipidemia regimen:  pravastatin 40 mg daily; last LDL at goal <70 o Current antiplatelet regimen: ASA 81 mg daily  . Hx prostate cancer: follows w/ [name]; alfuzosin 10 mg daily, just started on oxybutynin 5 mg BID; reports significant dry mouth since starting oxybutynin . COPD: Spiriva 2.5/2.5 mcg 2 puffs daily; very infrequent need for PRN albuteorl  Pharmacist Clinical Goal(s):  Marland Kitchen Over the next 90 days, patient will work with PharmD and primary care provider to address optimized medication management  Interventions: . Contacted patient's daughter, Matthew Miles. Reviewed that Trulicity only has one dose, it is not a dial; additionally, it is not insulin, and does not independently have a risk of low blood sugars. Reviewed GLP1 mechanism w/ Matthew Miles. She verbalized understanding.   Patient Self Care Activities:  . Patient will check blood glucose BID, document, and provide at future appointments . Patient will take medications as prescribed . Patient will contact provider with any episodes of hypoglycemia . Patient will report any questions or concerns to provider   Please see past updates related to this goal by clicking on the "Past Updates" button in the selected goal          Plan:  - Will outreach as previously scheduled  Catie Darnelle Maffucci, PharmD, [place], [place] Pharmacist [place] [phone]

## 2019-07-18 NOTE — Patient Instructions (Signed)
Visit Information  Goals Addressed            This Visit's Progress     Patient Stated   . PharmD "I want to work on my sugars" (pt-stated)       [place] (see longtitudinal plan of care for additional care plan information)  Current Barriers:  . Diabetes: uncontrolled; complicated by chronic medical conditions including HTN, HLD, hx prostate cancer, most recent A1c 8.2% . Most recent eGFR: ~49 mL/min . Current antihyperglycemic regimen: metformin XR 1000 mg BID, Jardiance 10 mg daily, glipizide XL 10 mg daily (prescribed BID but only taking daily); Trulicity 8.02 mg weekly - is picking up today o Daughter calls today asking about how to dial up the dose of the medication, as she has a family friend that accidentally dialed up too much insulin and went into a coma.  . Cardiovascular risk reduction (follows w/ [name]) o Current hypertensive regimen: Carvedilol 6.25 mg BID, torsemide 10 mg BID o Current hyperlipidemia regimen: pravastatin 40 mg daily; last LDL at goal <70 o Current antiplatelet regimen: ASA 81 mg daily  . Hx prostate cancer: follows w/ [name]; alfuzosin 10 mg daily, just started on oxybutynin 5 mg BID; reports significant dry mouth since starting oxybutynin . COPD: Spiriva 2.5/2.5 mcg 2 puffs daily; very infrequent need for PRN albuteorl  Pharmacist Clinical Goal(s):  Marland Kitchen Over the next 90 days, patient will work with PharmD and primary care provider to address optimized medication management  Interventions: . Contacted patient's daughter, [name]. Reviewed that Trulicity only has one dose, it is not a dial; additionally, it is not insulin, and does not independently have a risk of low blood sugars. Reviewed GLP1 mechanism w/ [name]. She verbalized understanding.   Patient Self Care Activities:  . Patient will check blood glucose BID, document, and provide at future appointments . Patient will take medications as prescribed . Patient will contact  provider with any episodes of hypoglycemia . Patient will report any questions or concerns to provider   Please see past updates related to this goal by clicking on the "Past Updates" button in the selected goal         Patient verbalizes understanding of instructions provided today.   Plan:  - Will outreach as previously scheduled  Catie Darnelle Maffucci, PharmD, [place], [place] Pharmacist [place] [phone]

## 2019-07-22 ENCOUNTER — Other Ambulatory Visit: Payer: Self-pay | Admitting: Family Medicine

## 2019-08-05 ENCOUNTER — Other Ambulatory Visit: Payer: Self-pay

## 2019-08-05 ENCOUNTER — Other Ambulatory Visit: Payer: Self-pay | Admitting: Family Medicine

## 2019-08-05 DIAGNOSIS — E1165 Type 2 diabetes mellitus with hyperglycemia: Secondary | ICD-10-CM

## 2019-08-05 DIAGNOSIS — E119 Type 2 diabetes mellitus without complications: Secondary | ICD-10-CM

## 2019-08-05 MED ORDER — ONETOUCH VERIO VI STRP: ORAL_STRIP | 1 refills | Status: DC

## 2019-08-05 MED ORDER — JARDIANCE 10 MG PO TABS: 10.0000 mg | ORAL_TABLET | Freq: Every day | ORAL | 2 refills | Status: DC

## 2019-08-05 MED ORDER — TRULICITY 0.75 MG/0.5ML ~~LOC~~ SOAJ: 0.7500 mg | SUBCUTANEOUS | 2 refills | Status: DC

## 2019-08-05 NOTE — Progress Notes (Signed)
Fax came in stating that the patient has a new pharmacy and needed theses medications sent to pharmacy.  Jardiance, test strips and Trulicity.  This was sent electronically to [place].

## 2019-08-13 ENCOUNTER — Telehealth: Payer: Self-pay | Admitting: Family Medicine

## 2019-08-13 ENCOUNTER — Telehealth: Payer: Self-pay

## 2019-08-13 DIAGNOSIS — E119 Type 2 diabetes mellitus without complications: Secondary | ICD-10-CM

## 2019-08-13 MED ORDER — ALCOHOL PREP 70 % PADS: 1.0000 | MEDICATED_PAD | Freq: Two times a day (BID) | 1 refills | Status: DC

## 2019-08-13 MED ORDER — ONETOUCH ULTRASOFT LANCETS MISC: 12 refills | Status: DC

## 2019-08-13 MED ORDER — ONETOUCH VERIO W/DEVICE KIT: 1.0000 | PACK | Freq: Once | 0 refills | Status: AC

## 2019-08-13 NOTE — Telephone Encounter (Signed)
Patient called and has switched to [place] and wanted his medications and diabetic supplies sent to [facility]. This was done. Today.  Verdelle Valtierra,cma

## 2019-08-13 NOTE — Telephone Encounter (Signed)
I spoke with [facility] and ordered what was needed for the patient.  Jostin Rue,cma

## 2019-08-13 NOTE — Telephone Encounter (Signed)
[employer] called about medication for Matthew Miles

## 2019-08-27 ENCOUNTER — Ambulatory Visit (INDEPENDENT_AMBULATORY_CARE_PROVIDER_SITE_OTHER): Payer: Medicare HMO | Admitting: Family Medicine

## 2019-08-27 ENCOUNTER — Encounter: Payer: Self-pay | Admitting: Family Medicine

## 2019-08-27 ENCOUNTER — Other Ambulatory Visit: Payer: Self-pay

## 2019-08-27 DIAGNOSIS — K219 Gastro-esophageal reflux disease without esophagitis: Secondary | ICD-10-CM | POA: Diagnosis not present

## 2019-08-27 DIAGNOSIS — E119 Type 2 diabetes mellitus without complications: Secondary | ICD-10-CM | POA: Diagnosis not present

## 2019-08-27 DIAGNOSIS — N401 Enlarged prostate with lower urinary tract symptoms: Secondary | ICD-10-CM | POA: Diagnosis not present

## 2019-08-27 DIAGNOSIS — I10 Essential (primary) hypertension: Secondary | ICD-10-CM

## 2019-08-27 LAB — POCT GLYCOSYLATED HEMOGLOBIN (HGB A1C): Hemoglobin A1C: 6.6 % — AB (ref 4.0–5.6)

## 2019-08-27 MED ORDER — FERROUS SULFATE 325 (65 FE) MG PO TABS: ORAL_TABLET | ORAL | 1 refills | Status: DC

## 2019-08-27 MED ORDER — ALFUZOSIN HCL ER 10 MG PO TB24: 10.0000 mg | ORAL_TABLET | Freq: Every day | ORAL | 0 refills | Status: DC

## 2019-08-27 MED ORDER — POTASSIUM CHLORIDE CRYS ER 10 MEQ PO TBCR: EXTENDED_RELEASE_TABLET | ORAL | 0 refills | Status: DC

## 2019-08-27 NOTE — Assessment & Plan Note (Signed)
Well-controlled.  Continue Protonix. 

## 2019-08-27 NOTE — Assessment & Plan Note (Signed)
Well-controlled.  Continue current regimen. 

## 2019-08-27 NOTE — Progress Notes (Signed)
Matthew Rumps, MD Phone: [phone]  Matthew Miles. is a 86 y.o. male who presents today for f/u.  DIABETES Disease Monitoring: Blood Sugar ranges-90-123 Polyuria/phagia/dipsia- no      Optho- UTD Medications: Compliance- taking jardiance, trulicity, glipizide, metformin Hypoglycemic symptoms- no  HYPERTENSION  Disease Monitoring  Home BP Monitoring not checking Chest pain- no    Dyspnea- no Medications  Compliance-  Taking coreg.   Edema- notes minimal edema that goes down over night  BPH: Currently on Uroxatrol.  He needs a refill of this.  He is following with urology and they tried him on oxybutynin that it caused increased frequency of urination and dry mouth so he discontinued this.  GERD:   Reflux symptoms: no   Abd pain: no   Blood in stool: no  Dysphagia: no   EGD: history in the past  Medication: taking protonix      Social History   Tobacco Use  Smoking Status Former Smoker  . Packs/day: 1.00  . Years: 65.00  . Pack years: 65.00  . Types: Cigarettes  . Quit date: 04/10/2011  . Years since quitting: 8.3  Smokeless Tobacco Former Systems developer  . Types: Chew     ROS see history of present illness  Objective  Physical Exam Vitals:   08/27/19 1104  BP: 110/60  Pulse: 68  Temp: (!) 97.1 F (36.2 C)  SpO2: 90%    BP Readings from Last 3 Encounters:  08/27/19 110/60  05/06/19 (!) 130/58  01/23/19 90/70   Wt Readings from Last 3 Encounters:  08/27/19 183 lb (83 kg)  06/26/19 184 lb (83.5 kg)  05/06/19 184 lb 8 oz (83.7 kg)    Physical Exam Constitutional:      General: He is not in acute distress.    Appearance: He is not diaphoretic.  Cardiovascular:     Rate and Rhythm: Normal rate and regular rhythm.     Heart sounds: Normal heart sounds.  Pulmonary:     Effort: Pulmonary effort is normal.     Breath sounds: Normal breath sounds.  Abdominal:     General: Bowel sounds are normal. There is no distension.     Palpations: Abdomen is  soft.     Tenderness: There is no abdominal tenderness. There is no guarding or rebound.  Musculoskeletal:     Comments: Trace pitting edema bilateral lower extremities  Skin:    General: Skin is warm and dry.  Neurological:     Mental Status: He is alert.      Assessment/Plan: Please see individual problem list.  Essential hypertension Well-controlled.  Continue current regimen.  GERD (gastroesophageal reflux disease) Well-controlled.  Continue Protonix.  Diabetes ([place]) Seems to be well controlled.  We will continue his current regimen.  We will check an A1c.  Consider discontinuing glipizide if A1c is well controlled.  BPH (benign prostatic hyperplasia) Stable.  He will continue Uroxatrol.  He will continue to follow with urology.   Orders Placed This Encounter  Procedures  . POCT HgB A1C    Meds ordered this encounter  Medications  . alfuzosin (UROXATRAL) 10 MG 24 hr tablet    Sig: Take 1 tablet (10 mg total) by mouth daily with breakfast.    Dispense:  90 tablet    Refill:  0    Future refills should be sent to the patients urologist  . ferrous sulfate (FEROSUL) 325 (65 FE) MG tablet    Sig: TAKE 1 TABLET(325 MG) BY MOUTH TWICE  DAILY WITH A MEAL    Dispense:  180 tablet    Refill:  1  . potassium chloride (KLOR-CON) 10 MEQ tablet    Sig: TAKE 1 TABLET(10 MEQ) BY MOUTH DAILY    Dispense:  90 tablet    Refill:  0    This visit occurred during the SARS-CoV-2 public health emergency.  Safety protocols were in place, including screening questions prior to the visit, additional usage of staff PPE, and extensive cleaning of exam room while observing appropriate contact time as indicated for disinfecting solutions.    Matthew Rumps, MD [place]

## 2019-08-27 NOTE — Assessment & Plan Note (Signed)
Seems to be well controlled.  We will continue his current regimen.  We will check an A1c.  Consider discontinuing glipizide if A1c is well controlled.

## 2019-08-27 NOTE — Patient Instructions (Signed)
Nice to see you. We will check an A1c today.

## 2019-08-27 NOTE — Assessment & Plan Note (Signed)
Stable.  He will continue Uroxatrol.  He will continue to follow with urology.

## 2019-09-02 ENCOUNTER — Other Ambulatory Visit: Payer: Self-pay

## 2019-09-02 DIAGNOSIS — E1165 Type 2 diabetes mellitus with hyperglycemia: Secondary | ICD-10-CM

## 2019-09-02 DIAGNOSIS — E119 Type 2 diabetes mellitus without complications: Secondary | ICD-10-CM

## 2019-09-02 MED ORDER — TRUE METRIX BLOOD GLUCOSE TEST VI STRP: ORAL_STRIP | 12 refills | Status: DC

## 2019-09-05 ENCOUNTER — Other Ambulatory Visit: Payer: Self-pay

## 2019-09-05 DIAGNOSIS — E1165 Type 2 diabetes mellitus with hyperglycemia: Secondary | ICD-10-CM

## 2019-09-05 MED ORDER — TRUE METRIX BLOOD GLUCOSE TEST VI STRP: ORAL_STRIP | 12 refills | Status: DC

## 2019-09-05 NOTE — Progress Notes (Signed)
Correction for the strips, had to add the instructions.  Zenon Leaf,cma

## 2019-09-10 ENCOUNTER — Other Ambulatory Visit: Payer: Self-pay

## 2019-09-13 ENCOUNTER — Other Ambulatory Visit: Payer: Self-pay

## 2019-09-16 ENCOUNTER — Ambulatory Visit (INDEPENDENT_AMBULATORY_CARE_PROVIDER_SITE_OTHER): Payer: Medicare HMO | Admitting: Pharmacist

## 2019-09-16 DIAGNOSIS — D509 Iron deficiency anemia, unspecified: Secondary | ICD-10-CM

## 2019-09-16 DIAGNOSIS — E119 Type 2 diabetes mellitus without complications: Secondary | ICD-10-CM

## 2019-09-16 MED ORDER — EMPAGLIFLOZIN 25 MG PO TABS: 25.0000 mg | ORAL_TABLET | Freq: Every day | ORAL | 3 refills | Status: DC

## 2019-09-16 NOTE — Patient Instructions (Addendum)
Mr. Tyler,   It was great talking with you today!  Since glipizide can cause low blood sugars, I do NOT want to start it back.   We are going to INCREASE Jardiance to 25 mg daily. While you wait for this strength tablet to ship from [facility], you can take two 10 mg tablet daily. Since this medication can increase urination, I would recommend taking it in the morning.   Continue metformin XR 1000 mg twice daily and Trulicity 5.63 mg weekly.   Remember, for an A1c of <7%, we want fasting readings <130 and 2 hour after meal readings <180.   Please call me with any questions!  Catie Darnelle Maffucci, PharmD, [place]  Visit Information  Goals Addressed            This Visit's Progress     Patient Stated   . PharmD "I want to work on my sugars" (pt-stated)       [place] (see longtitudinal plan of care for additional care plan information)  Current Barriers:  . Diabetes: CONTROLLED; complicated by chronic medical conditions including HTN, HLD, hx prostate cancer, most recent A1c 6.6% o Question about diarrhea - reports he has diarrhea after going out to eat ~once every 2-4 weeks. Wonders if the iron supplement could be contributing. Denies any change in frequency/severity since starting Trulicity o Reports that after d/c glipizide, fastings have been higher . Most recent eGFR: ~49 mL/min . Current antihyperglycemic regimen: metformin XR 1000 mg BID, Jardiance 10 mg daily; Trulicity 8.93 mg weekly . Denies hypoglycemia . Current glucose readings: o Fastings: 118s-150s . Current meal patterns: o Breakfast: cantaloupe; other days, eats a Consulting civil engineer sausage biscuit x 2  o Lunch: tomato sandwich (whole wheat bread);  o Snack: watermelon o Supper: tomato, precooked bacon, mayo;  o Drinks: water, mountain dew zero sugar . Cardiovascular risk reduction (follows w/ [name]) o Current hypertensive regimen: Carvedilol 6.25 mg BID, torsemide 10 mg BID o Current hyperlipidemia  regimen: pravastatin 40 mg daily; last LDL at goal <70 o Current antiplatelet regimen: ASA 81 mg daily  . Hx prostate cancer: follows w/ [name]; alfuzosin 10 mg daily, oxybutynin 5 mg BID; . COPD: Spiriva 2.5/2.5 mcg 2 puffs daily; very infrequent need for PRN albuterol  Pharmacist Clinical Goal(s):  Marland Kitchen Over the next 90 days, patient will work with PharmD and primary care provider to address optimized medication management  Interventions: . Comprehensive medication review performed, medication list updated in electronic medical record . Inter-disciplinary care team collaboration (see longitudinal plan of care) . Reviewed dietary choices. Discussed focusing on some protein source with each meal. Discussed appropriate watermelon serving sizes.  . Discussed increasing Jardiance vs increasing Trulicity. Although it does not seem to be related, will hold off on increasing Trulicity at this time to avoid exacerbating current GI complaints. Increase Jardiance to 25 mg daily. Patient counseled that he can take 2 10 mg tabs QAM until the new strength comes from mail order pharmacy. Patient notes he had been taking Jardiance QPM, so he will adjust this.  . Discussed goal A1c, goal fasting, and goal 2 hour post prandial glucose . Mailing updated medication list.  . Reviewed that since diarrhea happens only after eating meals out, he should review what he is eating that seems to contribute to diarrhea. Son, Amory, suggests that it may be related to greasy foods (ie, last time it happened, he had french fries). Encouraged to be mindful of greasy foods,  and see if avoiding these improves diarrhea. If no improvement, he is to discuss w/ PCP  Patient Self Care Activities:  . Patient will check blood glucose BID, document, and provide at future appointments . Patient will take medications as prescribed . Patient will contact provider with any episodes of hypoglycemia . Patient will report any questions or  concerns to provider   Please see past updates related to this goal by clicking on the "Past Updates" button in the selected goal         The patient verbalized understanding of instructions provided today and agreed to receive a mailed copy of patient instruction and/or educational materials.  Plan:  - Scheduled f/u call in ~ 8 weeks  Catie Darnelle Maffucci, PharmD, [employer], [place] Pharmacist [place] [phone]

## 2019-09-16 NOTE — Chronic Care Management (AMB) (Addendum)
Chronic Care Management   Follow Up Note   09/16/2019 Name: Matthew Miles. MRN: [id number] DOB: Aug 10, 1928  Referred by: [name], MD Reason for referral : Chronic Care Management (Medication Management)   Matthew Miles. is a 86 y.o. year old male who is a primary care patient of [name], [name], MD. The CCM team was consulted for assistance with chronic disease management and care coordination needs.    Contacted patient for medication management review. Also present was his son, [name].   Review of patient status, including review of consultants reports, relevant laboratory and other test results, and collaboration with appropriate care team members and the patient's provider was performed as part of comprehensive patient evaluation and provision of chronic care management services.    SDOH (Social Determinants of Health) assessments performed: No See Care Plan activities for detailed interventions related to [facility])     Outpatient Encounter Medications as of 09/16/2019  Medication Sig   alfuzosin (UROXATRAL) 10 MG 24 hr tablet Take 1 tablet (10 mg total) by mouth daily with breakfast.   aspirin 81 MG tablet Take 81 mg by mouth daily.    carvedilol (COREG) 6.25 MG tablet TAKE 1 TABLET(6.25 MG) BY MOUTH TWICE DAILY   Cyanocobalamin (B-12) 1000 MCG TBCR Take 1 tablet by mouth daily.    Dulaglutide (TRULICITY) 7.82 NF/6.2ZH SOPN Inject 0.75 mg into the skin once a week.   empagliflozin (JARDIANCE) 25 MG TABS tablet Take 1 tablet (25 mg total) by mouth daily.   ferrous sulfate (FEROSUL) 325 (65 FE) MG tablet TAKE 1 TABLET(325 MG) BY MOUTH TWICE DAILY WITH A MEAL   glucose blood (TRUE METRIX BLOOD GLUCOSE TEST) test strip Use as Directed. Check Blood Sugar Two times a day.  DX Code: E11.65.   Lancets (ONETOUCH ULTRASOFT) lancets Use as instructed   metFORMIN (GLUCOPHAGE-XR) 500 MG 24 hr tablet TAKE 2 TABLETS(1000 MG) BY MOUTH TWICE DAILY   oxybutynin  (DITROPAN) 5 MG tablet Take 5 mg by mouth 2 (two) times daily.    pantoprazole (PROTONIX) 20 MG tablet TAKE 1 TABLET(20 MG) BY MOUTH DAILY   potassium chloride (KLOR-CON) 10 MEQ tablet TAKE 1 TABLET(10 MEQ) BY MOUTH DAILY   pravastatin (PRAVACHOL) 40 MG tablet TAKE 1 TABLET(40 MG) BY MOUTH DAILY   SPIRIVA RESPIMAT 2.5 MCG/ACT AERS INHALE 2 PUFFS INTO THE LUNGS DAILY   torsemide (DEMADEX) 10 MG tablet TAKE 1 TABLET(10 MG) BY MOUTH TWICE DAILY AS NEEDED   [DISCONTINUED] Blood Glucose Monitoring Suppl (ACCU-CHEK GUIDE) w/Device KIT    [DISCONTINUED] empagliflozin (JARDIANCE) 10 MG TABS tablet Take 10 mg by mouth daily.   calcium-vitamin D 250-100 MG-UNIT tablet Take 1 tablet by mouth 2 (two) times daily.   clotrimazole (LOTRIMIN) 1 % cream Apply 1 application topically 2 (two) times daily.   nitroGLYCERIN (NITROSTAT) 0.4 MG SL tablet Place 1 tablet (0.4 mg total) under the tongue every 5 (five) minutes as needed.   PROAIR HFA 108 (90 Base) MCG/ACT inhaler INHALE 2 PUFFS INTO THE LUNGS EVERY 6 HOURS AS NEEDED FOR WHEEZING OR SHORTNESS OF BREATH   Zinc Oxide 13 % CREA Apply as needed to the wound on your left buttocks   [DISCONTINUED] Alcohol Swabs (ALCOHOL PREP) 70 % PADS 1 each by Does not apply route in the morning and at bedtime.   [DISCONTINUED] glipiZIDE (GLUCOTROL XL) 10 MG 24 hr tablet TAKE 1 TABLET(10 MG) BY MOUTH TWICE DAILY   No facility-administered encounter medications on file as  of 09/16/2019.     Objective:   Goals Addressed            This Visit's Progress     Patient Stated    PharmD "I want to work on my sugars" (pt-stated)       CARE PLAN ENTRY (see longtitudinal plan of care for additional care plan information)  Current Barriers:   Diabetes: CONTROLLED; complicated by chronic medical conditions including HTN, HLD, hx prostate cancer, most recent A1c 6.6% o Question about diarrhea - reports he has diarrhea after going out to eat ~once every 2-4 weeks.  Wonders if the iron supplement could be contributing. Denies any change in frequency/severity since starting Trulicity o Reports that after d/c glipizide, fastings have been higher  Most recent eGFR: ~49 mL/min  Current antihyperglycemic regimen: metformin XR 1000 mg BID, Jardiance 10 mg daily; Trulicity 5.42 mg weekly  Denies hypoglycemia  Current glucose readings: o Fastings: 118s-150s  Current meal patterns: o Breakfast: cantaloupe; other days, eats a Consulting civil engineer sausage biscuit x 2  o Lunch: tomato sandwich (whole wheat bread);  o Snack: watermelon o Supper: tomato, precooked bacon, mayo;  o Drinks: water, mountain dew zero sugar  Cardiovascular risk reduction (follows w/ [name]) o Current hypertensive regimen: Carvedilol 6.25 mg BID, torsemide 10 mg BID o Current hyperlipidemia regimen: pravastatin 40 mg daily; last LDL at goal <70 o Current antiplatelet regimen: ASA 81 mg daily   Hx prostate cancer: follows w/ [name]; alfuzosin 10 mg daily, oxybutynin 5 mg BID;  COPD: Spiriva 2.5/2.5 mcg 2 puffs daily; very infrequent need for PRN albuterol  Pharmacist Clinical Goal(s):   Over the next 90 days, patient will work with PharmD and primary care provider to address optimized medication management  Interventions:  Comprehensive medication review performed, medication list updated in electronic medical record  Inter-disciplinary care team collaboration (see longitudinal plan of care)  Reviewed dietary choices. Discussed focusing on some protein source with each meal. Discussed appropriate watermelon serving sizes.   Discussed increasing Jardiance vs increasing Trulicity. Although it does not seem to be related, will hold off on increasing Trulicity at this time to avoid exacerbating current GI complaints. Increase Jardiance to 25 mg daily. Patient counseled that he can take 2 10 mg tabs QAM until the new strength comes from mail order pharmacy. Patient notes he had  been taking Jardiance QPM, so he will adjust this.   Discussed goal A1c, goal fasting, and goal 2 hour post prandial glucose  Mailing updated medication list.   Reviewed that since diarrhea happens only after eating meals out, he should review what he is eating that seems to contribute to diarrhea. Son, Biehl, suggests that it may be related to greasy foods (ie, last time it happened, he had french fries). Encouraged to be mindful of greasy foods, and see if avoiding these improves diarrhea. If no improvement, he is to discuss w/ PCP  Patient Self Care Activities:   Patient will check blood glucose BID, document, and provide at future appointments  Patient will take medications as prescribed  Patient will contact provider with any episodes of hypoglycemia  Patient will report any questions or concerns to provider   Please see past updates related to this goal by clicking on the "Past Updates" button in the selected goal          Plan:  - Scheduled f/u call in ~ 8 weeks  Catie Darnelle Maffucci, PharmD, [place], [place] [phone]

## 2019-09-24 DIAGNOSIS — R918 Other nonspecific abnormal finding of lung field: Secondary | ICD-10-CM | POA: Diagnosis not present

## 2019-09-24 DIAGNOSIS — R944 Abnormal results of kidney function studies: Secondary | ICD-10-CM | POA: Diagnosis not present

## 2019-09-24 DIAGNOSIS — R351 Nocturia: Secondary | ICD-10-CM | POA: Diagnosis not present

## 2019-09-24 DIAGNOSIS — I739 Peripheral vascular disease, unspecified: Secondary | ICD-10-CM | POA: Diagnosis not present

## 2019-09-24 DIAGNOSIS — Z79818 Long term (current) use of other agents affecting estrogen receptors and estrogen levels: Secondary | ICD-10-CM | POA: Diagnosis not present

## 2019-09-24 DIAGNOSIS — C8307 Small cell B-cell lymphoma, spleen: Secondary | ICD-10-CM | POA: Diagnosis not present

## 2019-09-24 DIAGNOSIS — Z79899 Other long term (current) drug therapy: Secondary | ICD-10-CM | POA: Diagnosis not present

## 2019-09-24 DIAGNOSIS — R3912 Poor urinary stream: Secondary | ICD-10-CM | POA: Diagnosis not present

## 2019-09-24 DIAGNOSIS — R590 Localized enlarged lymph nodes: Secondary | ICD-10-CM | POA: Diagnosis not present

## 2019-09-24 DIAGNOSIS — C61 Malignant neoplasm of prostate: Secondary | ICD-10-CM | POA: Diagnosis not present

## 2019-09-24 DIAGNOSIS — R399 Unspecified symptoms and signs involving the genitourinary system: Secondary | ICD-10-CM | POA: Diagnosis not present

## 2019-09-24 DIAGNOSIS — N3941 Urge incontinence: Secondary | ICD-10-CM | POA: Diagnosis not present

## 2019-09-24 DIAGNOSIS — N179 Acute kidney failure, unspecified: Secondary | ICD-10-CM | POA: Diagnosis not present

## 2019-09-25 DIAGNOSIS — C8307 Small cell B-cell lymphoma, spleen: Secondary | ICD-10-CM | POA: Diagnosis not present

## 2019-10-02 ENCOUNTER — Other Ambulatory Visit: Payer: Self-pay | Admitting: Family Medicine

## 2019-10-04 ENCOUNTER — Other Ambulatory Visit: Payer: Self-pay | Admitting: Cardiovascular Disease

## 2019-10-07 ENCOUNTER — Other Ambulatory Visit (INDEPENDENT_AMBULATORY_CARE_PROVIDER_SITE_OTHER): Payer: Medicare HMO

## 2019-10-07 ENCOUNTER — Other Ambulatory Visit: Payer: Self-pay | Admitting: Family Medicine

## 2019-10-07 ENCOUNTER — Other Ambulatory Visit: Payer: Self-pay

## 2019-10-07 ENCOUNTER — Telehealth: Payer: Self-pay

## 2019-10-07 ENCOUNTER — Telehealth: Payer: Self-pay | Admitting: Family Medicine

## 2019-10-07 DIAGNOSIS — R300 Dysuria: Secondary | ICD-10-CM | POA: Diagnosis not present

## 2019-10-07 LAB — POCT URINALYSIS DIPSTICK
Bilirubin, UA: NEGATIVE
Glucose, UA: POSITIVE — AB
Ketones, UA: NEGATIVE
Nitrite, UA: NEGATIVE
Protein, UA: POSITIVE — AB
Spec Grav, UA: 1.015 (ref 1.010–1.025)
Urobilinogen, UA: 0.2 E.U./dL
pH, UA: 5 (ref 5.0–8.0)

## 2019-10-07 LAB — URINALYSIS, MICROSCOPIC ONLY

## 2019-10-07 MED ORDER — CEPHALEXIN 500 MG PO CAPS: 500.0000 mg | ORAL_CAPSULE | Freq: Four times a day (QID) | ORAL | 0 refills | Status: DC

## 2019-10-07 NOTE — Telephone Encounter (Signed)
I called and spoke with the patient's daughter and informed  her that we need a urine sample, I scheduled him for labs to get the urine sample. Patient is complaining of a lot of burning with urination.  Jaidynn Balster,cma

## 2019-10-07 NOTE — Telephone Encounter (Signed)
Ordered

## 2019-10-07 NOTE — Telephone Encounter (Signed)
What symptoms does he have? He would need to provide [place] with a urine prior to me prescribing antibiotics. They could some bay and provide a sample.

## 2019-10-07 NOTE — Addendum Note (Signed)
Addended by: Leeanne Rio on: 10/07/2019 02:45 PM   Modules accepted: Orders

## 2019-10-07 NOTE — Telephone Encounter (Signed)
Patient's daughter called and stated her father has a UTI, Matthew Miles tried to schedule a visit with there and she refused, she stated the provider had gotten just a urine sample before, he saw [facility] for a UTI on 09/24/2019 and the note is in his chart.  Please advise.  Erminia Mcnew,cma

## 2019-10-07 NOTE — Telephone Encounter (Signed)
Patient's daughter [name], [phone]. She thinks her father has a UTI and would like for him to have a urine sample. Office offered to set up appointment, daughter stated that Dr. [name] put in a order to lab for urine  sample before.

## 2019-10-08 LAB — URINE CULTURE
MICRO NUMBER:: 10641928
SPECIMEN QUALITY:: ADEQUATE

## 2019-10-17 ENCOUNTER — Telehealth: Payer: Self-pay | Admitting: Podiatry

## 2019-10-17 NOTE — Telephone Encounter (Signed)
Received voicemail from a male that pt is receiving a denial letter for the inserts that pt never got. He did get shoes. Please call and discuss.

## 2019-11-07 ENCOUNTER — Other Ambulatory Visit: Payer: Self-pay | Admitting: Family Medicine

## 2019-11-07 DIAGNOSIS — E119 Type 2 diabetes mellitus without complications: Secondary | ICD-10-CM

## 2019-11-11 ENCOUNTER — Ambulatory Visit (INDEPENDENT_AMBULATORY_CARE_PROVIDER_SITE_OTHER): Payer: Medicare HMO | Admitting: Pharmacist

## 2019-11-11 DIAGNOSIS — E119 Type 2 diabetes mellitus without complications: Secondary | ICD-10-CM

## 2019-11-11 DIAGNOSIS — N3941 Urge incontinence: Secondary | ICD-10-CM

## 2019-11-11 DIAGNOSIS — D509 Iron deficiency anemia, unspecified: Secondary | ICD-10-CM

## 2019-11-11 DIAGNOSIS — N401 Enlarged prostate with lower urinary tract symptoms: Secondary | ICD-10-CM

## 2019-11-11 DIAGNOSIS — J449 Chronic obstructive pulmonary disease, unspecified: Secondary | ICD-10-CM

## 2019-11-11 NOTE — Chronic Care Management (AMB) (Signed)
Chronic Care Management   Follow Up Note   11/11/2019 Name: Matthew Miles. MRN: [id number] DOB: 1928-07-07  Referred by: Matthew Haven, MD Reason for referral : Chronic Care Management (Medication Management)   Matthew Miles. is a 86 y.o. year old male who is a primary care patient of [name], [name], MD. The CCM team was consulted for assistance with chronic disease management and care coordination needs.    Contacted patient for medication management review.  Review of patient status, including review of consultants reports, relevant laboratory and other test results, and collaboration with appropriate care team members and the patient's provider was performed as part of comprehensive patient evaluation and provision of chronic care management services.    SDOH (Social Determinants of Health) assessments performed: Yes See Care Plan activities for detailed interventions related to SDOH)  SDOH Interventions     Most Recent Value  SDOH Interventions  Financial Strain Interventions Intervention Not Indicated  Housing Interventions Intervention Not Indicated       Outpatient Encounter Medications as of 11/11/2019  Medication Sig Note  . alfuzosin (UROXATRAL) 10 MG 24 hr tablet Take 1 tablet (10 mg total) by mouth daily with breakfast.   . aspirin 81 MG tablet Take 81 mg by mouth daily.    Marland Kitchen CALCIUM CITRATE-VITAMIN D PO Take 1 tablet by mouth daily. 1200 mg calcium   . carvedilol (COREG) 6.25 MG tablet TAKE 1 TABLET(6.25 MG) BY MOUTH TWICE DAILY   . Cyanocobalamin (B-12) 1000 MCG TBCR Take 1 tablet by mouth daily.    . empagliflozin (JARDIANCE) 25 MG TABS tablet Take 1 tablet (25 mg total) by mouth daily.   . ferrous sulfate (FEROSUL) 325 (65 FE) MG tablet TAKE 1 TABLET(325 MG) BY MOUTH TWICE DAILY WITH A MEAL 11/11/2019: Taking QAM  . glucose blood (TRUE METRIX BLOOD GLUCOSE TEST) test strip Use as Directed. Check Blood Sugar Two times a day.  DX Code: E11.65.   Marland Kitchen Lancets  (ONETOUCH ULTRASOFT) lancets Use as instructed   . metFORMIN (GLUCOPHAGE-XR) 500 MG 24 hr tablet TAKE 2 TABLETS(1000 MG) BY MOUTH TWICE DAILY   . pantoprazole (PROTONIX) 20 MG tablet TAKE 1 TABLET EVERY DAY   . potassium chloride (KLOR-CON) 10 MEQ tablet TAKE 1 TABLET(10 MEQ) BY MOUTH DAILY   . pravastatin (PRAVACHOL) 40 MG tablet TAKE 1 TABLET EVERY DAY   . PROAIR HFA 108 (90 Base) MCG/ACT inhaler INHALE 2 PUFFS INTO THE LUNGS EVERY 6 HOURS AS NEEDED FOR WHEEZING OR SHORTNESS OF BREATH   . SPIRIVA RESPIMAT 2.5 MCG/ACT AERS INHALE 2 PUFFS INTO THE LUNGS DAILY   . torsemide (DEMADEX) 10 MG tablet TAKE 1 TABLET(10 MG) BY MOUTH TWICE DAILY AS NEEDED   . TRULICITY 0.17 BL/3.9QZ SOPN INJECT 0.75MG (1 PEN) SUBCUTANEOUSLY EVERY WEEK   . [DISCONTINUED] oxybutynin (DITROPAN) 5 MG tablet Take 5 mg by mouth 2 (two) times daily.    . clotrimazole (LOTRIMIN) 1 % cream Apply 1 application topically 2 (two) times daily.   . nitroGLYCERIN (NITROSTAT) 0.4 MG SL tablet Place 1 tablet (0.4 mg total) under the tongue every 5 (five) minutes as needed. (Patient not taking: Reported on 11/11/2019)   . Zinc Oxide 13 % CREA Apply as needed to the wound on your left buttocks   . [DISCONTINUED] cephALEXin (KEFLEX) 500 MG capsule Take 1 capsule (500 mg total) by mouth 4 (four) times daily.    No facility-administered encounter medications on file as of 11/11/2019.  Objective:   Goals Addressed              This Visit's Progress     Patient Stated   .  PharmD "I want to work on my sugars" (pt-stated)        [place] (see longtitudinal plan of care for additional care plan information)  Current Barriers:  . Social, financial, community barriers:  o Patient reports that he is doing well. Reports a fantastic support system from his two children.  o Reports good visits w/ [name] and hem/onc . Diabetes: CONTROLLED; complicated by chronic medical conditions including HTN, HLD, hx prostate cancer, most  recent A1c 6.6% . Most recent eGFR: ~41 mL/min (per Care Everywhere) . Current antihyperglycemic regimen: metformin XR 1000 mg BID, Jardiance 25 mg daily; Trulicity 7.62 mg weekly . Current glucose readings: o Fastings: 120-140s . Cardiovascular risk reduction (follows w/ [name]) o Current hypertensive regimen: Carvedilol 6.25 mg BID, torsemide 10 mg BID; not checking BP at home, notes that he needs new batteries for his machine o Current hyperlipidemia regimen: pravastatin 40 mg daily; last LDL at goal <70 o Current antiplatelet regimen: ASA 81 mg daily  . Hx prostate cancer: follows w/ [name]; alfuzosin 10 mg daily- oxybutynin d/c d/t anticholinergic side effects,  o Notes today that he had forgotten he was to be taking calcium + vitamin D . COPD: Spiriva 2.5 mcg 2 puffs daily; very infrequent need for PRN albuterol . Supplements: Vitamin B12 1000 mcg, patient reports he is taking this BID; ferrous sulfate 325 mg QAM, patient asks if he needs to take this BID  Pharmacist Clinical Goal(s):  Marland Kitchen Over the next 90 days, patient will work with PharmD and primary care provider to address optimized medication management  Interventions: . Comprehensive medication review performed, medication list updated in electronic medical record . Inter-disciplinary care team collaboration (see longitudinal plan of care) . Mailing patient medication list w/ indications and administration times for his reference . Reviewed most recent lab work; CBC shows normal MCV. Encouraged to continue taking ferrous sulfate QAM as he has been doing, unless he is given other instruction by his other providers . Recommended he could reduce daily B12 intake to 1 tab (1000 mcg) daily. He verbalized understanding . Reviewed to take calcium + vit D daily per hem/onc. He verbalized understanding . Mailing patient blood sugar log to document fasting and occasional 2 hour post prandial. Patient verbalized  understanding . Mailing patient blood pressure log to document periodically. He notes he will get new batteries and start doing this.   Patient Self Care Activities:  . Patient will check blood glucose BID, document, and provide at future appointments . Patient will take medications as prescribed . Patient will contact provider with any episodes of hypoglycemia . Patient will report any questions or concerns to provider   Please see past updates related to this goal by clicking on the "Past Updates" button in the selected goal          Plan:  - Scheduled f/u call in ~ 4 weeks  Catie Darnelle Maffucci, PharmD, [place], [place] Pharmacist [place] [phone]

## 2019-11-11 NOTE — Patient Instructions (Addendum)
Mr. Lipsett,   It was great talking with you today!  Check your blood pressure twice daily - 1) first thing in the morning before breakfast and 2) about 2 hours after supper. Write down these readings on the enclosed log and we can discuss at our next phone call   Check your blood pressure and heart rate 3-4 times weekly. Write down these readings on the enclosed log and we can discuss at our next phone call   Here is how I recommend you take your medications (and what they are for):   Morning: - Torsemide 10 mg (fluid pill) - Carvedilol 6.25 mg (heart, blood pressure, heart rate) - Potassium 10 mEq daily (potassium supplementation) - Jardiance 25 mg daily (diabetes) - Metformin XR 500 mg - 2 tablets (diabetes) - Ferrous sulfate 325 mg (iron supplement) - Alfuzosin 10 mg daily (prostate) - Pantoprazole 40 mg (acid reflux) - Aspirin 81 mg daily (heart health) - Vitamin B12 1000 mcg daily  - Calcium + Vitamin D supplement daily  Evening: - Torsemide 10 mg (fluid pill) - you can actually take this one in the afternoon, if you prefer - Carvedilol 6.25 mg (heart, blood pressure, heart rate) - Metformin XR 500 mg - 2 tablets (diabetes) - Pravastatin 40 mg (cholesterol, heart health)  Trulicity 4.59 mg once weekly (diabetes)  Spiriva 1 puff daily (COPD); albuterol HFA 1-2 puffs as needed for shortness of breath  Please don't hesitate to call with any questions or concerns!  Catie Darnelle Maffucci, PharmD, [place], CPP Clinical Pharmacist [place] [phone]  Visit Information  Goals Addressed              This Visit's Progress     Patient Stated   .  PharmD "I want to work on my sugars" (pt-stated)        [place] (see longtitudinal plan of care for additional care plan information)  Current Barriers:  . Social, financial, community barriers:  o Patient reports that he is doing well. Reports a fantastic support  system from his two children.  o Reports good visits w/ [name] and hem/onc . Diabetes: CONTROLLED; complicated by chronic medical conditions including HTN, HLD, hx prostate cancer, most recent A1c 6.6% . Most recent eGFR: ~41 mL/min (per Care Everywhere) . Current antihyperglycemic regimen: metformin XR 1000 mg BID, Jardiance 25 mg daily; Trulicity 3.20 mg weekly . Current glucose readings: o Fastings: 120-140s . Cardiovascular risk reduction (follows w/ [name]) o Current hypertensive regimen: Carvedilol 6.25 mg BID, torsemide 10 mg BID; not checking BP at home, notes that he needs new batteries for his machine o Current hyperlipidemia regimen: pravastatin 40 mg daily; last LDL at goal <70 o Current antiplatelet regimen: ASA 81 mg daily  . Hx prostate cancer: follows w/ [name]; alfuzosin 10 mg daily- oxybutynin d/c d/t anticholinergic side effects,  o Notes today that he had forgotten he was to be taking calcium + vitamin D . COPD: Spiriva 2.5 mcg 2 puffs daily; very infrequent need for PRN albuterol . Supplements: Vitamin B12 1000 mcg, patient reports he is taking this BID; ferrous sulfate 325 mg QAM, patient asks if he needs to take this BID  Pharmacist Clinical Goal(s):  Marland Kitchen Over the next 90 days, patient will work with PharmD and primary care provider to address optimized medication management  Interventions: . Comprehensive medication review performed, medication list updated in electronic medical record . Inter-disciplinary care team collaboration (see longitudinal plan  of care) . Mailing patient medication list w/ indications and administration times for his reference . Reviewed most recent lab work; CBC shows normal MCV. Encouraged to continue taking ferrous sulfate QAM as he has been doing, unless he is given other instruction by his other providers . Recommended he could reduce daily B12 intake to 1 tab (1000 mcg) daily. He verbalized understanding . Reviewed to take  calcium + vit D daily per hem/onc. He verbalized understanding . Mailing patient blood sugar log to document fasting and occasional 2 hour post prandial. Patient verbalized understanding . Mailing patient blood pressure log to document periodically. He notes he will get new batteries and start doing this.   Patient Self Care Activities:  . Patient will check blood glucose BID, document, and provide at future appointments . Patient will take medications as prescribed . Patient will contact provider with any episodes of hypoglycemia . Patient will report any questions or concerns to provider   Please see past updates related to this goal by clicking on the "Past Updates" button in the selected goal         The patient verbalized understanding of instructions provided today and agreed to receive a mailed copy of patient instruction and/or educational materials.  Plan:  - Scheduled f/u call in ~ 4 weeks  Catie Darnelle Maffucci, PharmD, [place], [place] Pharmacist [place] [phone]

## 2019-11-14 ENCOUNTER — Ambulatory Visit: Payer: Medicare HMO | Admitting: Pharmacist

## 2019-11-14 ENCOUNTER — Telehealth: Payer: Self-pay | Admitting: Pharmacist

## 2019-11-14 DIAGNOSIS — J449 Chronic obstructive pulmonary disease, unspecified: Secondary | ICD-10-CM | POA: Diagnosis not present

## 2019-11-14 DIAGNOSIS — D509 Iron deficiency anemia, unspecified: Secondary | ICD-10-CM | POA: Diagnosis not present

## 2019-11-14 DIAGNOSIS — E119 Type 2 diabetes mellitus without complications: Secondary | ICD-10-CM

## 2019-11-14 DIAGNOSIS — N401 Enlarged prostate with lower urinary tract symptoms: Secondary | ICD-10-CM

## 2019-11-14 NOTE — Patient Instructions (Signed)
Visit Information  Goals Addressed              This Visit's Progress     Patient Stated   .  PharmD "I want to work on my sugars" (pt-stated)        [place] (see longtitudinal plan of care for additional care plan information)  Current Barriers:  . Social, financial, community barriers:  o Daughter calls today to report that patient has been having significant diarrhea (after meals, particularly). This is not new. This is chronic for many years. [name] wonders if this is related to the iron supplement. She also thinks that he eats too much fatty foods (s/p gallbladder removal), and that this worsens the issue. Notes that patient is afraid to leave the house because of the  . Diabetes: CONTROLLED; complicated by chronic medical conditions including HTN, HLD, hx prostate cancer, most recent A1c 6.6% . Most recent eGFR: ~41 mL/min (per Care Everywhere) . Current antihyperglycemic regimen: metformin XR 1000 mg BID, Jardiance 25 mg daily; Trulicity 6.94 mg weekly . Cardiovascular risk reduction (follows w/ [name]) o Current hypertensive regimen: Carvedilol 6.25 mg BID, torsemide 10 mg BID; not checking BP at home, notes that he needs new batteries for his machine o Current hyperlipidemia regimen: pravastatin 40 mg daily; last LDL at goal <70 o Current antiplatelet regimen: ASA 81 mg daily  . Hx prostate cancer: follows w/ [name]; alfuzosin 10 mg daily . COPD: Spiriva 2.5 mcg 2 puffs daily; very infrequent need for PRN albuterol . Supplements: Vitamin B12 1000 mcg daily ferrous sulfate 325 mg QAM  Pharmacist Clinical Goal(s):  Marland Kitchen Over the next 90 days, patient will work with PharmD and primary care provider to address optimized medication management  Interventions: . Comprehensive medication review performed, medication list updated in electronic medical record . Inter-disciplinary care team collaboration (see longitudinal plan of care) . Reviewed symptoms. Encouraged  [name] and Mathey to work with patient on avoiding high fat foods that exacerbate diarrhea. Discussed that they could try holding iron for a few days to see if diarrhea improves. If so, could try a Slow Fe extended release formulation to see if better tolerated. If no improvement w/ holding iron, recommended restarting. Discussed increasing fiber intake as well. If no improvement through dietary and lifestyle change, encouraged to discuss alternative tx options or GI referral w/ Dr. [name] at next appt.   Patient Self Care Activities:  . Patient will check blood glucose BID, document, and provide at future appointments . Patient will take medications as prescribed . Patient will contact provider with any episodes of hypoglycemia . Patient will report any questions or concerns to provider   Please see past updates related to this goal by clicking on the "Past Updates" button in the selected goal         The patient verbalized understanding of instructions provided today and declined a print copy of patient instruction materials.    Plan:  - Will outreach patient as previously scheduled  Catie Darnelle Maffucci, PharmD, [place], [place] Pharmacist [place] [phone]

## 2019-11-14 NOTE — Telephone Encounter (Signed)
error 

## 2019-11-14 NOTE — Chronic Care Management (AMB) (Signed)
Chronic Care Management   Follow Up Note   11/14/2019 Name: Matthew Miles. MRN: [id number] DOB: [birth year]  Referred by: [name], MD Reason for referral : Chronic Care Management (Medication Management)   Matthew Miles. is a 86 y.o. year old male who is a primary care patient of [name], [name], MD. The CCM team was consulted for assistance with chronic disease management and care coordination needs.    Contacted patient for medication management review.  Review of patient status, including review of consultants reports, relevant laboratory and other test results, and collaboration with appropriate care team members and the patient's provider was performed as part of comprehensive patient evaluation and provision of chronic care management services.    SDOH (Social Determinants of Health) assessments performed: Yes See Care Plan activities for detailed interventions related to [facility])     Outpatient Encounter Medications as of 11/14/2019  Medication Sig Note  . alfuzosin (UROXATRAL) 10 MG 24 hr tablet Take 1 tablet (10 mg total) by mouth daily with breakfast.   . aspirin 81 MG tablet Take 81 mg by mouth daily.    Marland Kitchen CALCIUM CITRATE-VITAMIN D PO Take 1 tablet by mouth daily. 1200 mg calcium   . carvedilol (COREG) 6.25 MG tablet TAKE 1 TABLET(6.25 MG) BY MOUTH TWICE DAILY   . clotrimazole (LOTRIMIN) 1 % cream Apply 1 application topically 2 (two) times daily.   . Cyanocobalamin (B-12) 1000 MCG TBCR Take 1 tablet by mouth daily.    . empagliflozin (JARDIANCE) 25 MG TABS tablet Take 1 tablet (25 mg total) by mouth daily.   . ferrous sulfate (FEROSUL) 325 (65 FE) MG tablet TAKE 1 TABLET(325 MG) BY MOUTH TWICE DAILY WITH A MEAL 11/11/2019: Taking QAM  . glucose blood (TRUE METRIX BLOOD GLUCOSE TEST) test strip Use as Directed. Check Blood Sugar Two times a day.  DX Code: E11.65.   Marland Kitchen Lancets (ONETOUCH ULTRASOFT) lancets Use as instructed   . metFORMIN (GLUCOPHAGE-XR) 500 MG 24  hr tablet TAKE 2 TABLETS(1000 MG) BY MOUTH TWICE DAILY   . nitroGLYCERIN (NITROSTAT) 0.4 MG SL tablet Place 1 tablet (0.4 mg total) under the tongue every 5 (five) minutes as needed. (Patient not taking: Reported on 11/11/2019)   . pantoprazole (PROTONIX) 20 MG tablet TAKE 1 TABLET EVERY DAY   . potassium chloride (KLOR-CON) 10 MEQ tablet TAKE 1 TABLET(10 MEQ) BY MOUTH DAILY   . pravastatin (PRAVACHOL) 40 MG tablet TAKE 1 TABLET EVERY DAY   . PROAIR HFA 108 (90 Base) MCG/ACT inhaler INHALE 2 PUFFS INTO THE LUNGS EVERY 6 HOURS AS NEEDED FOR WHEEZING OR SHORTNESS OF BREATH   . SPIRIVA RESPIMAT 2.5 MCG/ACT AERS INHALE 2 PUFFS INTO THE LUNGS DAILY   . torsemide (DEMADEX) 10 MG tablet TAKE 1 TABLET(10 MG) BY MOUTH TWICE DAILY AS NEEDED   . TRULICITY 6.01 UX/3.2TF SOPN INJECT 0.75MG (1 PEN) SUBCUTANEOUSLY EVERY WEEK   . Zinc Oxide 13 % CREA Apply as needed to the wound on your left buttocks    No facility-administered encounter medications on file as of 11/14/2019.     Objective:   Goals Addressed              This Visit's Progress     Patient Stated   .  PharmD "I want to work on my sugars" (pt-stated)        [place] (see longtitudinal plan of care for additional care plan information)  Current Barriers:  . Social, financial,  community barriers:  o Daughter calls today to report that patient has been having significant diarrhea (after meals, particularly). This is not new. This is chronic for many years. [name] wonders if this is related to the iron supplement. She also thinks that he eats too much fatty foods (s/p gallbladder removal), and that this worsens the issue. Notes that patient is afraid to leave the house because of the  . Diabetes: CONTROLLED; complicated by chronic medical conditions including HTN, HLD, hx prostate cancer, most recent A1c 6.6% . Most recent eGFR: ~41 mL/min (per Care Everywhere) . Current antihyperglycemic regimen: metformin XR 1000 mg BID, Jardiance 25  mg daily; Trulicity 2.63 mg weekly . Cardiovascular risk reduction (follows w/ [name]) o Current hypertensive regimen: Carvedilol 6.25 mg BID, torsemide 10 mg BID; not checking BP at home, notes that he needs new batteries for his machine o Current hyperlipidemia regimen: pravastatin 40 mg daily; last LDL at goal <70 o Current antiplatelet regimen: ASA 81 mg daily  . Hx prostate cancer: follows w/ [name]; alfuzosin 10 mg daily . COPD: Spiriva 2.5 mcg 2 puffs daily; very infrequent need for PRN albuterol . Supplements: Vitamin B12 1000 mcg daily ferrous sulfate 325 mg QAM  Pharmacist Clinical Goal(s):  Marland Kitchen Over the next 90 days, patient will work with PharmD and primary care provider to address optimized medication management  Interventions: . Comprehensive medication review performed, medication list updated in electronic medical record . Inter-disciplinary care team collaboration (see longitudinal plan of care) . Reviewed symptoms. Encouraged [name] and Baiz to work with patient on avoiding high fat foods that exacerbate diarrhea. Discussed that they could try holding iron for a few days to see if diarrhea improves. If so, could try a Slow Fe extended release formulation to see if better tolerated. If no improvement w/ holding iron, recommended restarting. Discussed increasing fiber intake as well. If no improvement through dietary and lifestyle change, encouraged to discuss alternative tx options or GI referral w/ Dr. [name] at next appt.   Patient Self Care Activities:  . Patient will check blood glucose BID, document, and provide at future appointments . Patient will take medications as prescribed . Patient will contact provider with any episodes of hypoglycemia . Patient will report any questions or concerns to provider   Please see past updates related to this goal by clicking on the "Past Updates" button in the selected goal          Plan:  - Will outreach patient  as previously scheduled  Catie Darnelle Maffucci, PharmD, [place], [place] Pharmacist [place] [phone]

## 2019-12-11 ENCOUNTER — Ambulatory Visit (INDEPENDENT_AMBULATORY_CARE_PROVIDER_SITE_OTHER): Payer: Medicare HMO | Admitting: Pharmacist

## 2019-12-11 DIAGNOSIS — E119 Type 2 diabetes mellitus without complications: Secondary | ICD-10-CM

## 2019-12-11 DIAGNOSIS — E782 Mixed hyperlipidemia: Secondary | ICD-10-CM

## 2019-12-11 NOTE — Patient Instructions (Addendum)
Mr. Balthazor,   I tried calling you a few times for our phone appointment today, but I couldn't get through. You may have been on the phone. I called [name] and talked a little bit about you, your health, and medications.   I'll try to pop in and say hello when you come to see Dr. [name] later this month.   If your A1c is >7%, I suggest we plan to increase the dose of your Trulicity (the weekly injection).   Call me if you have any questions or concerns in the meantime. Otherwise, I scheduled a follow up call on Monday, October 7th at 9 am. Call me to reschedule if that date/time doesn't work for you.   Take care!  Catie Darnelle Maffucci, PharmD [phone]  Visit Information  Goals Addressed              This Visit's Progress     Patient Stated   .  PharmD "I want to work on my sugars" (pt-stated)        [place] (see longtitudinal plan of care for additional care plan information)  Current Barriers:  . Social, financial, community barriers:  o None noted today by [name]. Notes that her father has been "slacking" on watching his carbohydrates, eating a lot of breads/sandwiches.  o Reports  o She reports that her father is still very active- goes to town to the grocery store a few times a week, though mostly to get out of the house, less so because he needs anything. Continues to mow his own lawn, drives himself places.  o Reports that diarrhea improved when he cut back on tomatoes. He confirms this. . Diabetes: CONTROLLED; complicated by chronic medical conditions including HTN, HLD, hx prostate cancer, most recent A1c 6.6% . Most recent eGFR: ~41 mL/min (per Care Everywhere) . Current antihyperglycemic regimen: metformin XR 1000 mg BID, Jardiance 25 mg daily; Trulicity 9.52 mg weekly . Current glucose readings:   Fasting Evening   9-Aug 152 156  10-Aug 140   11-Aug 127   12-Aug 131   13-Aug 143   14-Aug 138 149  15-Aug 137   16-Aug 135   17-Aug 123 218  18-Aug  132 184  19-Aug 129   20-Aug 130 186  21-Aug 139 200  22-Aug 143 198  23-Aug 141 149  24-Aug 153 189  25-Aug 139 219  26-Aug 138   27-Aug 134 163  28-Aug 136 187  29-Aug 148   30-Aug 130 265  31-Aug 134   1-Sep 134    137 189   . Current meal patterns: o Lots of breads, sweets per [name] . Cardiovascular risk reduction (follows w/ [name]) o Current hypertensive regimen: Carvedilol 6.25 mg BID, torsemide 10 mg BID; DENIES any dizziness, lightheadedness, or any palpitations.   SBP DBP HR  9-Aug 107 58 119  10-Aug     11-Aug 102 59 119  12-Aug     13-Aug 116 67 125  14-Aug     15-Aug     16-Aug 111 57 89  17-Aug 107 43 97  18-Aug     19-Aug 142 65 99  20-Aug     21-Aug     22-Aug     23-Aug     24-Aug 105 53 91  25-Aug     26-Aug     27-Aug     28-Aug     29-Aug     30-Aug     31-Aug  1-Sep      112 57 106   o Current hyperlipidemia regimen: pravastatin 40 mg daily; last LDL at goal <70 o Current antiplatelet regimen: ASA 81 mg daily  . Hx prostate cancer: follows w/ [name]; alfuzosin 10 mg daily . COPD: Spiriva 2.5 mcg 2 puffs daily; infrequent PRN albuterol  . Supplements: Vitamin B12 1000 mcg daily ferrous sulfate 325 mg QAM  Pharmacist Clinical Goal(s):  Marland Kitchen Over the next 90 days, patient will work with PharmD and primary care provider to address optimized medication management  Interventions: . Comprehensive medication review performed, medication list updated in electronic medical record . Inter-disciplinary care team collaboration (see longitudinal plan of care) . Discussed importance of patient maintaining his independence with [name].  . Praised patient for checking and documenting blood glucose and blood pressure. Wonder if patient is checking HR appropriately, as home readings >100 do not correlate with clinic HR readings. F/u w/ PCP coming.  . Discussed that pending upcoming A1c, we could increase Trulicity. This would help w/ BG  reduction as well as cutting back on appetite to reduce snacking, portion sizes. [name] verbalized understanding.   Patient Self Care Activities:  . Patient will check blood glucose BID, document, and provide at future appointments . Patient will take medications as prescribed . Patient will contact provider with any episodes of hypoglycemia . Patient will report any questions or concerns to provider   Please see past updates related to this goal by clicking on the "Past Updates" button in the selected goal         The patient verbalized understanding of instructions provided today and agreed to receive a mailed copy of patient instruction and/or educational materials.  Plan:  - Scheduled f/u call in ~ 5 weeks  Catie Darnelle Maffucci, PharmD, [place], [place] [phone]

## 2019-12-11 NOTE — Chronic Care Management (AMB) (Signed)
Chronic Care Management   Follow Up Note   12/11/2019 Name: Matthew Miles. MRN: [id number] DOB: December 26, 1928  Referred by: Matthew Haven, MD Reason for referral : Chronic Care Management (Medication Management)   Matthew Miles. is a 86 y.o. year old male who is a primary care patient of Matthew Miles, Matthew Adam, MD. The CCM team was consulted for assistance with chronic disease management and care coordination needs.    Contacted patient for medication management review x3 Called patient x 3, unfortunately, phone rang continuously and I was unable to leave a message. Patient may have been on the phone. I called patient's daughter, Matthew Miles (on [place]) to discuss health.   Review of patient status, including review of consultants reports, relevant laboratory and other test results, and collaboration with appropriate care team members and the patient's provider was performed as part of comprehensive patient evaluation and provision of chronic care management services.    SDOH (Social Determinants of Health) assessments performed: Yes See Care Plan activities for detailed interventions related to SDOH)  SDOH Interventions     Most Recent Value  SDOH Interventions  Financial Strain Interventions Intervention Not Indicated  Physical Activity Interventions Intervention Not Indicated       Outpatient Encounter Medications as of 12/11/2019  Medication Sig Note  . alfuzosin (UROXATRAL) 10 MG 24 hr tablet Take 1 tablet (10 mg total) by mouth daily with breakfast.   . aspirin 81 MG tablet Take 81 mg by mouth daily.    Marland Kitchen CALCIUM CITRATE-VITAMIN D PO Take 1 tablet by mouth daily. 1200 mg calcium   . carvedilol (COREG) 6.25 MG tablet TAKE 1 TABLET(6.25 MG) BY MOUTH TWICE DAILY   . clotrimazole (LOTRIMIN) 1 % cream Apply 1 application topically 2 (two) times daily.   . Cyanocobalamin (B-12) 1000 MCG TBCR Take 1 tablet by mouth daily.    . empagliflozin (JARDIANCE) 25 MG TABS tablet Take 1 tablet (25  mg total) by mouth daily.   . ferrous sulfate (FEROSUL) 325 (65 FE) MG tablet TAKE 1 TABLET(325 MG) BY MOUTH TWICE DAILY WITH A MEAL 11/11/2019: Taking QAM  . glucose blood (TRUE METRIX BLOOD GLUCOSE TEST) test strip Use as Directed. Check Blood Sugar Two times a day.  DX Code: E11.65.   Marland Kitchen Lancets (ONETOUCH ULTRASOFT) lancets Use as instructed   . metFORMIN (GLUCOPHAGE-XR) 500 MG 24 hr tablet TAKE 2 TABLETS(1000 MG) BY MOUTH TWICE DAILY   . pantoprazole (PROTONIX) 20 MG tablet TAKE 1 TABLET EVERY DAY   . potassium chloride (KLOR-CON) 10 MEQ tablet TAKE 1 TABLET(10 MEQ) BY MOUTH DAILY   . pravastatin (PRAVACHOL) 40 MG tablet TAKE 1 TABLET EVERY DAY   . SPIRIVA RESPIMAT 2.5 MCG/ACT AERS INHALE 2 PUFFS INTO THE LUNGS DAILY   . torsemide (DEMADEX) 10 MG tablet TAKE 1 TABLET(10 MG) BY MOUTH TWICE DAILY AS NEEDED   . TRULICITY 4.09 BD/5.3GD SOPN INJECT 0.75MG (1 PEN) SUBCUTANEOUSLY EVERY WEEK   . Zinc Oxide 13 % CREA Apply as needed to the wound on your left buttocks   . nitroGLYCERIN (NITROSTAT) 0.4 MG SL tablet Place 1 tablet (0.4 mg total) under the tongue every 5 (five) minutes as needed. (Patient not taking: Reported on 11/11/2019)   . PROAIR HFA 108 (90 Base) MCG/ACT inhaler INHALE 2 PUFFS INTO THE LUNGS EVERY 6 HOURS AS NEEDED FOR WHEEZING OR SHORTNESS OF BREATH    No facility-administered encounter medications on file as of 12/11/2019.     Objective:  Goals Addressed              This Visit's Progress     Patient Stated   .  PharmD "I want to work on my sugars" (pt-stated)        Matthew Miles (see longtitudinal plan of care for additional care plan information)  Current Barriers:  . Social, financial, community barriers:  o None noted today by [name]. Notes that her father has been "slacking" on watching his carbohydrates, eating a lot of breads/sandwiches.  o Reports  o She reports that her father is still very active- goes to town to the grocery store a few times a week,  though mostly to get out of the house, less so because he needs anything. Continues to mow his own lawn, drives himself places.  o Reports that diarrhea improved when he cut back on tomatoes . Diabetes: CONTROLLED; complicated by chronic medical conditions including HTN, HLD, hx prostate cancer, most recent A1c 6.6% . Most recent eGFR: ~41 mL/min (per Care Everywhere) . Current antihyperglycemic regimen: metformin XR 1000 mg BID, Jardiance 25 mg daily; Trulicity 2.99 mg weekly . Current glucose readings: was checking BID after our last call, but cut back in the past few weeks  . Current meal patterns: o Lots of breads, sweets per Matthew Miles . Cardiovascular risk reduction (follows w/ [name]) o Current hypertensive regimen: Carvedilol 6.25 mg BID, torsemide 10 mg BID o Current hyperlipidemia regimen: pravastatin 40 mg daily; last LDL at goal <70 o Current antiplatelet regimen: ASA 81 mg daily  . Hx prostate cancer: follows w/ [name]; alfuzosin 10 mg daily . COPD: Spiriva 2.5 mcg 2 puffs daily; infrequent PRN albuterol  . Supplements: Vitamin B12 1000 mcg daily ferrous sulfate 325 mg QAM  Pharmacist Clinical Goal(s):  Marland Kitchen Over the next 90 days, patient will work with PharmD and primary care provider to address optimized medication management  Interventions: . Comprehensive medication review performed, medication list updated in electronic medical record . Inter-disciplinary care team collaboration (see longitudinal plan of care) . Discussed importance of patient maintaining his independence with Matthew Miles.  . Discussed that pending upcoming A1c, we could increase Trulicity. This would help w/ BG reduction as well as cutting back on appetite to reduce snacking, portion sizes. Matthew Miles verbalized understanding.   Patient Self Care Activities:  . Patient will check blood glucose BID, document, and provide at future appointments . Patient will take medications as prescribed . Patient will  contact provider with any episodes of hypoglycemia . Patient will report any questions or concerns to provider   Please see past updates related to this goal by clicking on the "Past Updates" button in the selected goal          Plan:  - Scheduled f/u call in ~ 5 weeks  Catie Darnelle Maffucci, PharmD, [place], [place] Pharmacist [place] [phone]

## 2019-12-11 NOTE — Chronic Care Management (AMB) (Signed)
Chronic Care Management   Follow Up Note   12/11/2019 Name: Matthew Miles. MRN: [id number] DOB: Mar 07, 1929  Referred by: Matthew Haven, MD Reason for referral : Chronic Care Management (Medication Management)   Matthew Miles. is a 86 y.o. year old male who is a primary care patient of Matthew Miles, Matthew Adam, MD. The CCM team was consulted for assistance with chronic disease management and care coordination needs.    Patient returned my call from earlier today.   Review of patient status, including review of consultants reports, relevant laboratory and other test results, and collaboration with appropriate care team members and the patient's provider was performed as part of comprehensive patient evaluation and provision of chronic care management services.    SDOH (Social Determinants of Health) assessments performed: Yes See Care Plan activities for detailed interventions related to SDOH)  SDOH Interventions     Most Recent Value  SDOH Interventions  Financial Strain Interventions Intervention Not Indicated  Physical Activity Interventions Intervention Not Indicated       Outpatient Encounter Medications as of 12/11/2019  Medication Sig Note   alfuzosin (UROXATRAL) 10 MG 24 hr tablet Take 1 tablet (10 mg total) by mouth daily with breakfast.    aspirin 81 MG tablet Take 81 mg by mouth daily.     CALCIUM CITRATE-VITAMIN D PO Take 1 tablet by mouth daily. 1200 mg calcium    carvedilol (COREG) 6.25 MG tablet TAKE 1 TABLET(6.25 MG) BY MOUTH TWICE DAILY    clotrimazole (LOTRIMIN) 1 % cream Apply 1 application topically 2 (two) times daily.    Cyanocobalamin (B-12) 1000 MCG TBCR Take 1 tablet by mouth daily.     empagliflozin (JARDIANCE) 25 MG TABS tablet Take 1 tablet (25 mg total) by mouth daily.    ferrous sulfate (FEROSUL) 325 (65 FE) MG tablet TAKE 1 TABLET(325 MG) BY MOUTH TWICE DAILY WITH A MEAL 11/11/2019: Taking QAM   glucose blood (TRUE METRIX BLOOD GLUCOSE TEST)  test strip Use as Directed. Check Blood Sugar Two times a day.  DX Code: E11.65.    Lancets (ONETOUCH ULTRASOFT) lancets Use as instructed    metFORMIN (GLUCOPHAGE-XR) 500 MG 24 hr tablet TAKE 2 TABLETS(1000 MG) BY MOUTH TWICE DAILY    pantoprazole (PROTONIX) 20 MG tablet TAKE 1 TABLET EVERY DAY    potassium chloride (KLOR-CON) 10 MEQ tablet TAKE 1 TABLET(10 MEQ) BY MOUTH DAILY    pravastatin (PRAVACHOL) 40 MG tablet TAKE 1 TABLET EVERY DAY    SPIRIVA RESPIMAT 2.5 MCG/ACT AERS INHALE 2 PUFFS INTO THE LUNGS DAILY    torsemide (DEMADEX) 10 MG tablet TAKE 1 TABLET(10 MG) BY MOUTH TWICE DAILY AS NEEDED    TRULICITY 6.78 LF/8.1OF SOPN INJECT 0.75MG (1 PEN) SUBCUTANEOUSLY EVERY WEEK    Zinc Oxide 13 % CREA Apply as needed to the wound on your left buttocks    nitroGLYCERIN (NITROSTAT) 0.4 MG SL tablet Place 1 tablet (0.4 mg total) under the tongue every 5 (five) minutes as needed. (Patient not taking: Reported on 11/11/2019)    PROAIR HFA 108 (90 Base) MCG/ACT inhaler INHALE 2 PUFFS INTO THE LUNGS EVERY 6 HOURS AS NEEDED FOR WHEEZING OR SHORTNESS OF BREATH    No facility-administered encounter medications on file as of 12/11/2019.     Objective:   Goals Addressed              This Visit's Progress     Patient Stated     PharmD "I want to work  on my sugars" (pt-stated)        CARE PLAN ENTRY (see longtitudinal plan of care for additional care plan information)  Current Barriers:   Social, financial, community barriers:  o None noted today by [name]. Notes that her father has been "slacking" on watching his carbohydrates, eating a lot of breads/sandwiches.  o Reports  o She reports that her father is still very active- goes to town to the grocery store a few times a week, though mostly to get out of the house, less so because he needs anything. Continues to mow his own lawn, drives himself places.  o Reports that diarrhea improved when he cut back on tomatoes. He confirms  this.  Diabetes: CONTROLLED; complicated by chronic medical conditions including HTN, HLD, hx prostate cancer, most recent A1c 6.6%  Most recent eGFR: ~41 mL/min (per Care Everywhere)  Current antihyperglycemic regimen: metformin XR 1000 mg BID, Jardiance 25 mg daily; Trulicity 4.16 mg weekly  Current glucose readings:   Fasting Evening   9-Aug 152 156  10-Aug 140   11-Aug 127   12-Aug 131   13-Aug 143   14-Aug 138 149  15-Aug 137   16-Aug 135   17-Aug 123 218  18-Aug 132 184  19-Aug 129   20-Aug 130 186  21-Aug 139 200  22-Aug 143 198  23-Aug 141 149  24-Aug 153 189  25-Aug 139 219  26-Aug 138   27-Aug 134 163  28-Aug 136 187  29-Aug 148   30-Aug 130 265  31-Aug 134   1-Sep 134    137 189    Current meal patterns: o Lots of breads, sweets per Matthew Miles  Cardiovascular risk reduction (follows w/ Dr. Rockey Miles) o Current hypertensive regimen: Carvedilol 6.25 mg BID, torsemide 10 mg BID; DENIES any dizziness, lightheadedness, or any palpitations.   SBP DBP HR  9-Aug 107 58 119  10-Aug     11-Aug 102 59 119  12-Aug     13-Aug 116 67 125  14-Aug     15-Aug     16-Aug 111 57 89  17-Aug 107 43 97  18-Aug     19-Aug 142 65 99  20-Aug     21-Aug     22-Aug     23-Aug     24-Aug 105 53 91  25-Aug     26-Aug     27-Aug     28-Aug     29-Aug     30-Aug     31-Aug     1-Sep      112 57 106   o Current hyperlipidemia regimen: pravastatin 40 mg daily; last LDL at goal <70 o Current antiplatelet regimen: ASA 81 mg daily   Hx prostate cancer: follows w/ [name]; alfuzosin 10 mg daily  COPD: Spiriva 2.5 mcg 2 puffs daily; infrequent PRN albuterol   Supplements: Vitamin B12 1000 mcg daily ferrous sulfate 325 mg QAM  Pharmacist Clinical Goal(s):   Over the next 90 days, patient will work with PharmD and primary care provider to address optimized medication management  Interventions:  Comprehensive medication review performed, medication list updated in  electronic medical record  Inter-disciplinary care team collaboration (see longitudinal plan of care)  Discussed importance of patient maintaining his independence with Matthew Miles.   Praised patient for checking and documenting blood glucose and blood pressure. Wonder if patient is checking HR appropriately, as home readings >100 do not correlate with clinic HR readings. F/u w/ PCP coming.  Discussed that pending upcoming A1c, we could increase Trulicity. This would help w/ BG reduction as well as cutting back on appetite to reduce snacking, portion sizes. Matthew Miles verbalized understanding.   Patient Self Care Activities:   Patient will check blood glucose BID, document, and provide at future appointments  Patient will take medications as prescribed  Patient will contact provider with any episodes of hypoglycemia  Patient will report any questions or concerns to provider   Please see past updates related to this goal by clicking on the "Past Updates" button in the selected goal          Plan:  - Will follow up as previously scheduled  Catie Darnelle Maffucci, PharmD, [place], [place] Pharmacist [place] [phone]

## 2019-12-17 ENCOUNTER — Other Ambulatory Visit: Payer: Self-pay | Admitting: Family Medicine

## 2019-12-24 ENCOUNTER — Other Ambulatory Visit: Payer: Self-pay | Admitting: Family Medicine

## 2019-12-24 DIAGNOSIS — E119 Type 2 diabetes mellitus without complications: Secondary | ICD-10-CM

## 2019-12-25 ENCOUNTER — Ambulatory Visit: Payer: Medicare HMO | Admitting: Pharmacist

## 2019-12-25 ENCOUNTER — Telehealth: Payer: Medicare HMO

## 2019-12-25 ENCOUNTER — Telehealth: Payer: Self-pay | Admitting: Pharmacist

## 2019-12-25 DIAGNOSIS — E782 Mixed hyperlipidemia: Secondary | ICD-10-CM

## 2019-12-25 DIAGNOSIS — I10 Essential (primary) hypertension: Secondary | ICD-10-CM

## 2019-12-25 DIAGNOSIS — E119 Type 2 diabetes mellitus without complications: Secondary | ICD-10-CM | POA: Diagnosis not present

## 2019-12-25 DIAGNOSIS — N401 Enlarged prostate with lower urinary tract symptoms: Secondary | ICD-10-CM

## 2019-12-25 NOTE — Chronic Care Management (AMB) (Signed)
Chronic Care Management   Follow Up Note   12/25/2019 Name: Matthew Miles. MRN: [id number] DOB: 1929-03-19  Referred by: Matthew Miles Reason for referral : Chronic Care Management (Medication Management)   Matthew Miles. is a 86 y.o. year old male who is a primary care patient of Matthew Miles, Matthew Adam, Miles. The CCM team was consulted for assistance with chronic disease management and care coordination needs.    Received call from patient's daughter, Matthew Miles, with questions about patient's current medication list.  Review of patient status, including review of consultants reports, relevant laboratory and other test results, and collaboration with appropriate care team members and the patient's provider was performed as part of comprehensive patient evaluation and provision of chronic care management services.    SDOH (Social Determinants of Health) assessments performed: No See Care Plan activities for detailed interventions related to [facility])     Outpatient Encounter Medications as of 12/25/2019  Medication Sig Note  . alfuzosin (UROXATRAL) 10 MG 24 hr tablet Take 1 tablet (10 mg total) by mouth daily with breakfast.   . aspirin 81 MG tablet Take 81 mg by mouth daily.    Marland Kitchen CALCIUM CITRATE-VITAMIN D PO Take 1 tablet by mouth daily. 1200 mg calcium   . carvedilol (COREG) 6.25 MG tablet TAKE 1 TABLET(6.25 MG) BY MOUTH TWICE DAILY   . clotrimazole (LOTRIMIN) 1 % cream Apply 1 application topically 2 (two) times daily.   . empagliflozin (JARDIANCE) 25 MG TABS tablet Take 1 tablet (25 mg total) by mouth daily.   . ferrous sulfate (FEROSUL) 325 (65 FE) MG tablet TAKE 1 TABLET(325 MG) BY MOUTH TWICE DAILY WITH A MEAL 11/11/2019: Taking QAM  . glucose blood (TRUE METRIX BLOOD GLUCOSE TEST) test strip Use as Directed. Check Blood Sugar Two times a day.  DX Code: E11.65.   Marland Kitchen Lancets (ONETOUCH ULTRASOFT) lancets Use as instructed   . metFORMIN (GLUCOPHAGE-XR) 500 MG 24 hr tablet TAKE 2  TABLETS(1000 MG) BY MOUTH TWICE DAILY   . pantoprazole (PROTONIX) 20 MG tablet TAKE 1 TABLET EVERY DAY   . potassium chloride (KLOR-CON) 10 MEQ tablet TAKE 1 TABLET(10 MEQ) BY MOUTH DAILY   . pravastatin (PRAVACHOL) 40 MG tablet TAKE 1 TABLET EVERY DAY   . PROAIR HFA 108 (90 Base) MCG/ACT inhaler INHALE 2 PUFFS INTO THE LUNGS EVERY 6 HOURS AS NEEDED FOR WHEEZING OR SHORTNESS OF BREATH   . SPIRIVA RESPIMAT 2.5 MCG/ACT AERS INHALE 2 PUFFS INTO THE LUNGS DAILY   . torsemide (DEMADEX) 10 MG tablet TAKE 1 TABLET(10 MG) BY MOUTH TWICE DAILY AS NEEDED   . TRULICITY 2.87 GO/1.1XB SOPN INJECT 0.75MG (1 PEN) SUBCUTANEOUSLY EVERY WEEK   . Cyanocobalamin (B-12) 1000 MCG TBCR Take 1 tablet by mouth daily.    . nitroGLYCERIN (NITROSTAT) 0.4 MG SL tablet Place 1 tablet (0.4 mg total) under the tongue every 5 (five) minutes as needed. (Patient not taking: Reported on 11/11/2019)   . Zinc Oxide 13 % CREA Apply as needed to the wound on your left buttocks    No facility-administered encounter medications on file as of 12/25/2019.     Objective:   Goals Addressed              This Visit's Progress     Patient Stated   .  PharmD "I want to work on my sugars" (pt-stated)        Matthew Miles (see longtitudinal plan of care for additional care plan information)  Current Barriers:  . Social, financial, community barriers:  o Matthew Miles calls today, has questions about current medications. Notes that they received a call from Matthew Miles about needing refills for some of this medications. They noted needing a refill on pravastatin, pantoprazole, alfuzosin.  . Diabetes: CONTROLLED; complicated by chronic medical conditions including HTN, HLD, hx prostate cancer, most recent A1c 6.6% . Most recent eGFR: ~41 mL/min (per Care Everywhere) . Current antihyperglycemic regimen: metformin XR 1000 mg BID, Jardiance 25 mg daily; Trulicity 1.76 mg weekly . Cardiovascular risk reduction (follows w/ Dr.  Rockey Miles) o Current hypertensive regimen: Carvedilol 6.25 mg BID, torsemide 10 mg BID; DENIES any dizziness, lightheadedness, or any palpitations.  o Current hyperlipidemia regimen: pravastatin 40 mg daily; last LDL at goal <70 o Current antiplatelet regimen: ASA 81 mg daily  . Hx prostate cancer: follows w/ [name]; alfuzosin 10 mg daily . COPD: Spiriva 2.5 mcg 2 puffs daily; infrequent PRN albuterol  . Supplements: Vitamin B12 1000 mcg daily, ferrous sulfate 325 mg QAM  Pharmacist Clinical Goal(s):  Marland Kitchen Over the next 90 days, patient will work with PharmD and primary care provider to address optimized medication management  Interventions: . Comprehensive medication review performed, medication list updated in electronic medical record . Inter-disciplinary care team collaboration (see longitudinal plan of care) . Reviewed refill hx. Patient is not due for any refills per last times that refills were sent to [facility]. Reviewed medication list with Matthew Miles. Reviewed to contact [employer] when he is due for refills, and they will fax our office directly. She verbalized understanding.  . Discussed sugar-free and low-sugar dessert options.  Patient Self Care Activities:  . Patient will check blood glucose BID, document, and provide at future appointments . Patient will take medications as prescribed . Patient will contact provider with any episodes of hypoglycemia . Patient will report any questions or concerns to provider   Please see past updates related to this goal by clicking on the "Past Updates" button in the selected goal          Plan:  - Will outreach as previously scheduled  Catie Darnelle Maffucci, PharmD, [place], [place] Pharmacist [place] [phone]

## 2019-12-25 NOTE — Patient Instructions (Signed)
Visit Information  Goals Addressed              This Visit's Progress     Patient Stated   .  PharmD "I want to work on my sugars" (pt-stated)        [place] (see longtitudinal plan of care for additional care plan information)  Current Barriers:  . Social, financial, community barriers:  o Matthew Miles calls today, has questions about current medications. Notes that they received a call from [place] asking about needing refills for some of this medications. They noted needing a refill on pravastatin, pantoprazole, alfuzosin.  . Diabetes: CONTROLLED; complicated by chronic medical conditions including HTN, HLD, hx prostate cancer, most recent A1c 6.6% . Most recent eGFR: ~41 mL/min (per Care Everywhere) . Current antihyperglycemic regimen: metformin XR 1000 mg BID, Jardiance 25 mg daily; Trulicity 9.02 mg weekly . Cardiovascular risk reduction (follows w/ [name]) o Current hypertensive regimen: Carvedilol 6.25 mg BID, torsemide 10 mg BID; DENIES any dizziness, lightheadedness, or any palpitations.  o Current hyperlipidemia regimen: pravastatin 40 mg daily; last LDL at goal <70 o Current antiplatelet regimen: ASA 81 mg daily  . Hx prostate cancer: follows w/ [name]; alfuzosin 10 mg daily . COPD: Spiriva 2.5 mcg 2 puffs daily; infrequent PRN albuterol  . Supplements: Vitamin B12 1000 mcg daily, ferrous sulfate 325 mg QAM  Pharmacist Clinical Goal(s):  Marland Kitchen Over the next 90 days, patient will work with PharmD and primary care provider to address optimized medication management  Interventions: . Comprehensive medication review performed, medication list updated in electronic medical record . Inter-disciplinary care team collaboration (see longitudinal plan of care) . Reviewed refill hx. Patient is not due for any refills per last times that refills were sent to [facility]. Reviewed medication list with Matthew Miles. Reviewed to contact [employer] when he is due for refills, and  they will fax our office directly. She verbalized understanding.  . Discussed sugar-free and low-sugar dessert options.  Patient Self Care Activities:  . Patient will check blood glucose BID, document, and provide at future appointments . Patient will take medications as prescribed . Patient will contact provider with any episodes of hypoglycemia . Patient will report any questions or concerns to provider   Please see past updates related to this goal by clicking on the "Past Updates" button in the selected goal         The patient verbalized understanding of instructions provided today and declined a print copy of patient instruction materials.   Plan:  - Will outreach as previously scheduled  Catie Darnelle Maffucci, PharmD, [place], [place] Pharmacist [place] [phone]

## 2019-12-25 NOTE — Telephone Encounter (Signed)
°  Chronic Care Management   Note  12/25/2019 Name: Tamel Abel. MRN: [id number] DOB: [birth year]   Received a call while out of the office from patient's daughter, [name], requesting a call about a question she has regarding one of his medications.  Called Barbara, LVM for her to return my call at her convenience.     Catie Darnelle Maffucci, PharmD, [place], CPP Clinical Pharmacist [place] [phone]

## 2019-12-30 ENCOUNTER — Other Ambulatory Visit: Payer: Self-pay

## 2019-12-30 ENCOUNTER — Ambulatory Visit: Payer: Medicare HMO | Admitting: Pharmacist

## 2019-12-30 ENCOUNTER — Encounter: Payer: Self-pay | Admitting: Family Medicine

## 2019-12-30 ENCOUNTER — Ambulatory Visit (INDEPENDENT_AMBULATORY_CARE_PROVIDER_SITE_OTHER): Payer: Medicare HMO | Admitting: Family Medicine

## 2019-12-30 DIAGNOSIS — I10 Essential (primary) hypertension: Secondary | ICD-10-CM

## 2019-12-30 DIAGNOSIS — E119 Type 2 diabetes mellitus without complications: Secondary | ICD-10-CM

## 2019-12-30 MED ORDER — TRULICITY 1.5 MG/0.5ML ~~LOC~~ SOAJ: 1.5000 mg | SUBCUTANEOUS | Status: DC

## 2019-12-30 NOTE — Chronic Care Management (AMB) (Signed)
Chronic Care Management   Follow Up Note   12/30/2019 Name: Carmine Carrozza. MRN: [id number] DOB: 1928/10/21  Referred by: [name], MD Reason for referral : Chronic Care Management (Medication Management)   Walter Grima. is a 86 y.o. year old male who is a primary care patient of [name], [name], MD. The CCM team was consulted for assistance with chronic disease management and care coordination needs.    Met with patient face to face today during PCP office visit.   Review of patient status, including review of consultants reports, relevant laboratory and other test results, and collaboration with appropriate care team members and the patient's provider was performed as part of comprehensive patient evaluation and provision of chronic care management services.    SDOH (Social Determinants of Health) assessments performed: No See Care Plan activities for detailed interventions related to [facility])     Outpatient Encounter Medications as of 12/30/2019  Medication Sig Note   alfuzosin (UROXATRAL) 10 MG 24 hr tablet Take 1 tablet (10 mg total) by mouth daily with breakfast.    aspirin 81 MG tablet Take 81 mg by mouth daily.     CALCIUM CITRATE-VITAMIN D PO Take 1 tablet by mouth daily. 1200 mg calcium    carvedilol (COREG) 6.25 MG tablet TAKE 1 TABLET(6.25 MG) BY MOUTH TWICE DAILY    clotrimazole (LOTRIMIN) 1 % cream Apply 1 application topically 2 (two) times daily.    Cyanocobalamin (B-12) 1000 MCG TBCR Take 1 tablet by mouth daily.     Dulaglutide (TRULICITY) 1.5 DJ/2.4QA SOPN Inject 1.5 mg into the skin once a week.    ferrous sulfate (FEROSUL) 325 (65 FE) MG tablet TAKE 1 TABLET(325 MG) BY MOUTH TWICE DAILY WITH A MEAL 11/11/2019: Taking QAM   glucose blood (TRUE METRIX BLOOD GLUCOSE TEST) test strip Use as Directed. Check Blood Sugar Two times a day.  DX Code: E11.65.    Lancets (ONETOUCH ULTRASOFT) lancets Use as instructed    metFORMIN (GLUCOPHAGE-XR) 500  MG 24 hr tablet TAKE 2 TABLETS(1000 MG) BY MOUTH TWICE DAILY    nitroGLYCERIN (NITROSTAT) 0.4 MG SL tablet Place 1 tablet (0.4 mg total) under the tongue every 5 (five) minutes as needed.    pantoprazole (PROTONIX) 20 MG tablet TAKE 1 TABLET EVERY DAY    potassium chloride (KLOR-CON) 10 MEQ tablet TAKE 1 TABLET(10 MEQ) BY MOUTH DAILY    pravastatin (PRAVACHOL) 40 MG tablet TAKE 1 TABLET EVERY DAY    PROAIR HFA 108 (90 Base) MCG/ACT inhaler INHALE 2 PUFFS INTO THE LUNGS EVERY 6 HOURS AS NEEDED FOR WHEEZING OR SHORTNESS OF BREATH    SPIRIVA RESPIMAT 2.5 MCG/ACT AERS INHALE 2 PUFFS INTO THE LUNGS DAILY    torsemide (DEMADEX) 10 MG tablet TAKE 1 TABLET(10 MG) BY MOUTH TWICE DAILY AS NEEDED    Zinc Oxide 13 % CREA Apply as needed to the wound on your left buttocks    No facility-administered encounter medications on file as of 12/30/2019.     Objective:   Goals Addressed              This Visit's Progress     Patient Stated     PharmD "I want to work on my sugars" (pt-stated)        [place] (see longtitudinal plan of care for additional care plan information)  Current Barriers:   Social, financial, community barriers:  o Patient here today for f/u with PCP. Hypotensive but w/o symptoms.  o Notes that he is tolerating Trulicity well. Endorses ~7 lbs weight loss since starting Trulicity   Diabetes: CONTROLLED; complicated by chronic medical conditions including HTN, HLD, hx prostate cancer, most recent A1c 6.6%  Most recent eGFR: ~41 mL/min (per Care Everywhere)  Current antihyperglycemic regimen: metformin XR 1000 mg BID, Jardiance 25 mg daily; Trulicity 8.84 mg weekly QFriday  Current meal patterns: o Notes that he has cut back on tomatoes and this has improved his stomach upset o Used to eat 2 pimiento cheese sandwiches and wasn't full; since being on Trulicity, this has improved, and he is full after 1 sandwich. Notes he used to get a 2 chicken breast plate at  [employer], but now can barely finish a "children's" plate.   Cardiovascular risk reduction (follows w/ [name]) o Current hypertensive regimen: Carvedilol 6.25 mg BID, torsemide 10 mg BID;  o Current hyperlipidemia regimen: pravastatin 40 mg daily; last LDL at goal <70 o Current antiplatelet regimen: ASA 81 mg daily   Hx prostate cancer: follows w/ [name]; alfuzosin 10 mg daily  COPD: Spiriva 2.5 mcg 2 puffs daily; infrequent PRN albuterol   Supplements: Vitamin B12 1000 mcg daily, ferrous sulfate 325 mg QAM  Pharmacist Clinical Goal(s):   Over the next 90 days, patient will work with PharmD and primary care provider to address optimized medication management  Interventions:  PCP to d/c Jardiance today. He is following up in 2 days to recheck and determine if carvedilol needs to be decreased.   Praised patient for focus on decreasing portion sizes and reducing carbohydrate intake. Praised for weight loss.   Patient Self Care Activities:   Patient will check blood glucose BID, document, and provide at future appointments  Patient will take medications as prescribed  Patient will contact provider with any episodes of hypoglycemia  Patient will report any questions or concerns to provider   Please see past updates related to this goal by clicking on the "Past Updates" button in the selected goal          Plan:  - Will follow up as previously scheduled  Catie Darnelle Maffucci, PharmD, [place], [place] Pharmacist [place] [phone]

## 2019-12-30 NOTE — Patient Instructions (Signed)
Visit Information  Goals Addressed              This Visit's Progress     Patient Stated   .  PharmD "I want to work on my sugars" (pt-stated)        [place] (see longtitudinal plan of care for additional care plan information)  Current Barriers:  . Social, financial, community barriers:  o Patient here today for f/u with PCP. Hypotensive but w/o symptoms.  o Notes that he is tolerating Trulicity well. Endorses ~7 lbs weight loss since starting Trulicity  . Diabetes: CONTROLLED; complicated by chronic medical conditions including HTN, HLD, hx prostate cancer, most recent A1c 6.6% . Most recent eGFR: ~41 mL/min (per Care Everywhere) . Current antihyperglycemic regimen: metformin XR 1000 mg BID, Jardiance 25 mg daily; Trulicity 3.57 mg weekly QFriday . Current meal patterns: o Notes that he has cut back on tomatoes and this has improved his stomach upset o Used to eat 2 pimiento cheese sandwiches and wasn't full; since being on Trulicity, this has improved, and he is full after 1 sandwich. Notes he used to get a 2 chicken breast plate at [employer], but now can barely finish a "children's" plate.  . Cardiovascular risk reduction (follows w/ [name]) o Current hypertensive regimen: Carvedilol 6.25 mg BID, torsemide 10 mg BID;  o Current hyperlipidemia regimen: pravastatin 40 mg daily; last LDL at goal <70 o Current antiplatelet regimen: ASA 81 mg daily  . Hx prostate cancer: follows w/ [name]; alfuzosin 10 mg daily . COPD: Spiriva 2.5 mcg 2 puffs daily; infrequent PRN albuterol  . Supplements: Vitamin B12 1000 mcg daily, ferrous sulfate 325 mg QAM  Pharmacist Clinical Goal(s):  Marland Kitchen Over the next 90 days, patient will work with PharmD and primary care provider to address optimized medication management  Interventions: . PCP to d/c Jardiance today. He is following up in 2 days to recheck and determine if carvedilol needs to be decreased.  . Praised patient for  focus on decreasing portion sizes and reducing carbohydrate intake. Praised for weight loss.   Patient Self Care Activities:  . Patient will check blood glucose BID, document, and provide at future appointments . Patient will take medications as prescribed . Patient will contact provider with any episodes of hypoglycemia . Patient will report any questions or concerns to provider   Please see past updates related to this goal by clicking on the "Past Updates" button in the selected goal         The patient verbalized understanding of instructions provided today and declined a print copy of patient instruction materials.   Plan:  - Will follow up as previously scheduled  Catie Darnelle Maffucci, PharmD, [place], [place] Pharmacist [place] [phone]

## 2019-12-30 NOTE — Patient Instructions (Signed)
Nice to see you. Please discontinue your Jardiance. We will increase your Trulicity to 1.5 mg once weekly.  You may use 2 of your 0.75 mg pens at a time for your Trulicity to equal the 1.5 mg dose.  Please let us know when you are almost out of those pins. If you develop lightheadedness, chest pain, or any new symptoms or if you pass out you need to be seen in the emergency department for evaluation.

## 2019-12-30 NOTE — Assessment & Plan Note (Signed)
BP is now actually lower than normal.  Suspect his Matthew Miles is contributing to this.  We will discontinue that at this time.  He can continue on his carvedilol for the moment and we will have him come back in 2 days for recheck.  If he develops any lightheadedness, chest pain, or any new symptoms he will be seen in the emergency department.  We will check lab work as outlined below to see if his kidneys are tolerating this low BP and to help determine if there is other causes underlying this.

## 2019-12-30 NOTE — Assessment & Plan Note (Signed)
We will discontinue his Jardiance.  He will increase the Trulicity to 1.5 mg once weekly.  Discussed that he could use 2 of his 0.75 mg pens to equal this until he runs out of his current supply.  He notes he has a 17-month supply of 0.75 mg pens.

## 2019-12-30 NOTE — Progress Notes (Signed)
Matthew Rumps, MD Phone: [phone]  Matthew Miles. is a 86 y.o. male who presents today for follow-up.  Low BP/hypertension: Patient notes this has been going on for about a month.  Has been running in the 90s over 72s.  He notes overall he feels quite well.  Currently on carvedilol twice daily and torsemide twice daily as needed.  He is also on Jardiance.  He is drinking 2 bottles of fluid daily.  He notes his urination is normal.  No chest pain or shortness of breath.  No lightheadedness.  Denies nausea, vomiting, stomach upset.  Denies syncope.  Diabetes: Typically running 128-132.  Currently on Jardiance and Trulicity.  No polyuria or polydipsia.  No hypoglycemia.  Social History   Tobacco Use  Smoking Status Former Smoker  . Packs/day: 1.00  . Years: 65.00  . Pack years: 65.00  . Types: Cigarettes  . Quit date: 04/10/2011  . 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 Types: Chew     ROS see history of present illness  Objective  Physical Exam Vitals:   12/30/19 1349 12/30/19 1355  BP: (!) 90/40 (!) 105/45  Pulse: 93   Temp: 98.9 F (37.2 C)   SpO2: 95%     BP Readings from Last 3 Encounters:  12/30/19 (!) 105/45  08/27/19 110/60  05/06/19 (!) 130/58   Wt Readings from Last 3 Encounters:  12/30/19 176 lb (79.8 kg)  08/27/19 183 lb (83 kg)  06/26/19 184 lb (83.5 kg)    Physical Exam Constitutional:      General: He is not in acute distress.    Appearance: He is not diaphoretic.  Cardiovascular:     Rate and Rhythm: Normal rate and regular rhythm.     Heart sounds: Murmur (2/6 systolic murmur left upper sternal border) heard.   Pulmonary:     Effort: Pulmonary effort is normal.     Breath sounds: Normal breath sounds.  Musculoskeletal:     Right lower leg: No edema.     Left lower leg: No edema.  Skin:    General: Skin is warm and dry.  Neurological:     Mental Status: He is alert.      Assessment/Plan: Please see  individual problem list.  Essential hypertension BP is now actually lower than normal.  Suspect his Matthew Miles is contributing to this.  We will discontinue that at this time.  He can continue on his carvedilol for the moment and we will have him come back in 2 days for recheck.  If he develops any lightheadedness, chest pain, or any new symptoms he will be seen in the emergency department.  We will check lab work as outlined below to see if his kidneys are tolerating this low BP and to help determine if there is other causes underlying this.  Diabetes ([place]) We will discontinue his Jardiance.  He will increase the Trulicity to 1.5 mg once weekly.  Discussed that he could use 2 of his 0.75 mg pens to equal this until he runs out of his current supply.  He notes he has a 97-monthsupply of 0.75 mg pens.    Orders Placed This Encounter  Procedures  . HgB A1c  . Comp Met (CMET)  . TSH  . CBC  . Urine Microalbumin w/creat. ratio    Meds ordered this encounter  Medications  . Dulaglutide (TRULICITY) 1.5 MQZ/0.0PQSOPN    Sig: Inject 1.5 mg into the skin once  a week.    Matthew Miles was seen today for follow-up.  Diagnoses and all orders for this visit:  Type 2 diabetes mellitus without complication, without long-term current use of insulin (HCC) -     HgB A1c -     Urine Microalbumin w/creat. ratio  Essential hypertension -     Comp Met (CMET) -     TSH -     CBC  Other orders -     Dulaglutide (TRULICITY) 1.5 VD/4.7VE SOPN; Inject 1.5 mg into the skin once a week.     This visit occurred during the SARS-CoV-2 public health emergency.  Safety protocols were in place, including screening questions prior to the visit, additional usage of staff PPE, and extensive cleaning of exam room while observing appropriate contact time as indicated for disinfecting solutions.    Matthew Rumps, MD [place]

## 2019-12-31 DIAGNOSIS — Z79899 Other long term (current) drug therapy: Secondary | ICD-10-CM | POA: Diagnosis not present

## 2019-12-31 DIAGNOSIS — R590 Localized enlarged lymph nodes: Secondary | ICD-10-CM | POA: Diagnosis not present

## 2019-12-31 DIAGNOSIS — R944 Abnormal results of kidney function studies: Secondary | ICD-10-CM | POA: Diagnosis not present

## 2019-12-31 DIAGNOSIS — C61 Malignant neoplasm of prostate: Secondary | ICD-10-CM | POA: Diagnosis not present

## 2019-12-31 DIAGNOSIS — R232 Flushing: Secondary | ICD-10-CM | POA: Diagnosis not present

## 2019-12-31 DIAGNOSIS — Z79818 Long term (current) use of other agents affecting estrogen receptors and estrogen levels: Secondary | ICD-10-CM | POA: Diagnosis not present

## 2019-12-31 LAB — COMPREHENSIVE METABOLIC PANEL
ALT: 12 U/L (ref 0–53)
AST: 13 U/L (ref 0–37)
Albumin: 4.1 g/dL (ref 3.5–5.2)
Alkaline Phosphatase: 93 U/L (ref 39–117)
BUN: 23 mg/dL (ref 6–23)
CO2: 25 mEq/L (ref 19–32)
Calcium: 8.8 mg/dL (ref 8.4–10.5)
Chloride: 103 mEq/L (ref 96–112)
Creatinine, Ser: 1.27 mg/dL (ref 0.40–1.50)
GFR: 53.39 mL/min — ABNORMAL LOW (ref >60.00)
Glucose, Bld: 182 mg/dL — ABNORMAL HIGH (ref 70–99)
Potassium: 4.6 mEq/L (ref 3.5–5.1)
Sodium: 138 mEq/L (ref 135–145)
Total Bilirubin: 0.5 mg/dL (ref 0.2–1.2)
Total Protein: 6.2 g/dL (ref 6.0–8.3)

## 2019-12-31 LAB — HEMOGLOBIN A1C: Hgb A1c MFr Bld: 6.8 % — ABNORMAL HIGH (ref 4.6–6.5)

## 2019-12-31 LAB — CBC
HCT: 33.9 % — ABNORMAL LOW (ref 39.0–52.0)
Hemoglobin: 11.2 g/dL — ABNORMAL LOW (ref 13.0–17.0)
MCHC: 33.1 g/dL (ref 30.0–36.0)
MCV: 87.1 fl (ref 78.0–100.0)
Platelets: 83 10*3/uL — ABNORMAL LOW (ref 150.0–400.0)
RBC: 3.89 Mil/uL — ABNORMAL LOW (ref 4.22–5.81)
RDW: 16.1 % — ABNORMAL HIGH (ref 11.5–15.5)
WBC: 4.1 10*3/uL (ref 4.0–10.5)

## 2019-12-31 LAB — MICROALBUMIN / CREATININE URINE RATIO
Creatinine,U: 66.1 mg/dL
Microalb Creat Ratio: 9.3 mg/g (ref 0.0–30.0)
Microalb, Ur: 6.2 mg/dL — ABNORMAL HIGH (ref 0.0–1.9)

## 2019-12-31 LAB — TSH: TSH: 4.92 u[IU]/mL — ABNORMAL HIGH (ref 0.35–4.50)

## 2020-01-01 ENCOUNTER — Ambulatory Visit (INDEPENDENT_AMBULATORY_CARE_PROVIDER_SITE_OTHER): Payer: Medicare HMO | Admitting: Family Medicine

## 2020-01-01 ENCOUNTER — Encounter: Payer: Self-pay | Admitting: Family Medicine

## 2020-01-01 ENCOUNTER — Other Ambulatory Visit: Payer: Self-pay

## 2020-01-01 VITALS — BP 120/60 | HR 100 | Temp 98.7°F | Ht 63.0 in | Wt 174.2 lb

## 2020-01-01 DIAGNOSIS — I10 Essential (primary) hypertension: Secondary | ICD-10-CM | POA: Diagnosis not present

## 2020-01-01 DIAGNOSIS — R0989 Other specified symptoms and signs involving the circulatory and respiratory systems: Secondary | ICD-10-CM | POA: Diagnosis not present

## 2020-01-01 DIAGNOSIS — D696 Thrombocytopenia, unspecified: Secondary | ICD-10-CM | POA: Diagnosis not present

## 2020-01-01 DIAGNOSIS — R7989 Other specified abnormal findings of blood chemistry: Secondary | ICD-10-CM

## 2020-01-01 DIAGNOSIS — E1142 Type 2 diabetes mellitus with diabetic polyneuropathy: Secondary | ICD-10-CM | POA: Diagnosis not present

## 2020-01-01 DIAGNOSIS — Z23 Encounter for immunization: Secondary | ICD-10-CM

## 2020-01-01 DIAGNOSIS — D509 Iron deficiency anemia, unspecified: Secondary | ICD-10-CM

## 2020-01-01 NOTE — Addendum Note (Signed)
Addended by: [name] on: 01/01/2020 04:12 PM   Modules accepted: Orders

## 2020-01-01 NOTE — Assessment & Plan Note (Signed)
Blood pressure has improved with stopping Jardiance.  He will continue carvedilol 6.25 mg twice daily and torsemide 10 mg twice daily as needed.  He will monitor his blood pressure and if it starts to drop lower he will let us know.

## 2020-01-01 NOTE — Assessment & Plan Note (Signed)
ABIs ordered. 

## 2020-01-01 NOTE — Patient Instructions (Signed)
Nice to see you. Please continue with the carvedilol and torsemide. We will see you back in 3 months.

## 2020-01-01 NOTE — Assessment & Plan Note (Signed)
Minimal elevation.  Plan to recheck in 3 months at follow-up.

## 2020-01-01 NOTE — Progress Notes (Signed)
Matthew Rumps, MD Phone: [phone]  Matthew Jividen. is a 86 y.o. male who presents today for f/u.  HYPERTENSION  Disease Monitoring  Home BP Monitoring 116-120/57 Chest pain- no    Medications  Compliance-  Taking coreg and torsemide. Lightheadedness-  No Stopped jardiance due to low BPs. BP has trended up since stopping this.   Elevated TSH: mild elevation. No fatigue.   Anemia/thrombocytopenia: stable.  Monitored by oncology at [facility].     Social History   Tobacco Use  Smoking Status Former Smoker  . Packs/day: 1.00  . Years: 65.00  . Pack years: 65.00  . Types: Cigarettes  . Quit date: 04/10/2011  . 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 Types: Chew     ROS see history of present illness  Objective  Physical Exam Vitals:   01/01/20 1516  BP: 120/60  Pulse: 100  Temp: 98.7 F (37.1 C)  SpO2: 96%    BP Readings from Last 3 Encounters:  01/01/20 120/60  12/30/19 (!) 105/45  08/27/19 110/60   Wt Readings from Last 3 Encounters:  01/01/20 174 lb 3.2 oz (79 kg)  12/30/19 176 lb (79.8 kg)  08/27/19 183 lb (83 kg)    Physical Exam Constitutional:      General: He is not in acute distress.    Appearance: He is not diaphoretic.  Cardiovascular:     Rate and Rhythm: Normal rate and regular rhythm.     Heart sounds: Normal heart sounds.  Pulmonary:     Effort: Pulmonary effort is normal.  Skin:    General: Skin is warm and dry.  Neurological:     Mental Status: He is alert.    Diabetic Foot Exam - Simple   Simple Foot Form Diabetic Foot exam was performed with the following findings: Yes 01/01/2020  3:41 PM  Visual Inspection No deformities, no ulcerations, no other skin breakdown bilaterally: Yes Sensation Testing Intact to touch and monofilament testing bilaterally: Yes Pulse Check Posterior Tibialis and Dorsalis pulse intact bilaterally: Yes Comments Difficult to palpate PT and DP pulses bilaterally.        Assessment/Plan: Please see individual problem list.  Essential hypertension Blood pressure has improved with stopping Jardiance.  He will continue carvedilol 6.25 mg twice daily and torsemide 10 mg twice daily as needed.  He will monitor his blood pressure and if it starts to drop lower he will let us know.  Thrombocytopenia ([place]) He is seeing oncology for this.  Iron deficiency anemia He will continue to have this monitored by oncology.  Elevated TSH Minimal elevation.  Plan to recheck in 3 months at follow-up.  Decreased pedal pulses ABIs ordered.  Diabetic neuropathy ([place]) Diabetic foot exam completed today.    Orders Placed This Encounter  Procedures  . US ARTERIAL ABI (SCREENING LOWER EXTREMITY)    Standing Status:   Future    Standing Expiration Date:   12/31/2020    Order Specific Question:   Reason for Exam (SYMPTOM  OR DIAGNOSIS REQUIRED)    Answer:   Decreased pedal pulses    Order Specific Question:   Preferred imaging location?    Answer:   [place]    No orders of the defined types were placed in this encounter.   Alcus was seen today for follow-up.  Diagnoses and all orders for this visit:  Essential hypertension  Thrombocytopenia (HCC)  Iron deficiency anemia, unspecified iron deficiency anemia type  Elevated TSH  Decreased  pedal pulses -     US ARTERIAL ABI (SCREENING LOWER EXTREMITY); Future  Diabetic polyneuropathy associated with type 2 diabetes mellitus ([place])     This visit occurred during the SARS-CoV-2 public health emergency.  Safety protocols were in place, including screening questions prior to the visit, additional usage of staff PPE, and extensive cleaning of exam room while observing appropriate contact time as indicated for disinfecting solutions.    Matthew Rumps, MD [place]

## 2020-01-01 NOTE — Assessment & Plan Note (Signed)
He is seeing oncology for this.

## 2020-01-01 NOTE — Assessment & Plan Note (Signed)
He will continue to have this monitored by oncology.

## 2020-01-01 NOTE — Assessment & Plan Note (Signed)
Diabetic foot exam completed today 

## 2020-01-06 ENCOUNTER — Other Ambulatory Visit: Payer: Self-pay

## 2020-01-06 ENCOUNTER — Ambulatory Visit (HOSPITAL_COMMUNITY)
Admission: RE | Admit: 2020-01-06 | Discharge: 2020-01-06 | Disposition: A | Discharge status: Home / Self Care | Payer: Medicare HMO | Source: Ambulatory Visit | Attending: Family Medicine | Admitting: Family Medicine

## 2020-01-06 ENCOUNTER — Other Ambulatory Visit: Payer: Self-pay | Admitting: Family Medicine

## 2020-01-06 DIAGNOSIS — E1165 Type 2 diabetes mellitus with hyperglycemia: Secondary | ICD-10-CM

## 2020-01-06 DIAGNOSIS — R0989 Other specified symptoms and signs involving the circulatory and respiratory systems: Secondary | ICD-10-CM

## 2020-01-06 NOTE — Progress Notes (Signed)
VASCULAR LAB    Resting ABIs and TBIs have been performed.  See CV proc for preliminary results.   Mance Vallejo, RVT 01/06/2020, 10:33 AM

## 2020-01-07 ENCOUNTER — Other Ambulatory Visit: Payer: Self-pay | Admitting: Family Medicine

## 2020-01-07 DIAGNOSIS — I739 Peripheral vascular disease, unspecified: Secondary | ICD-10-CM

## 2020-01-09 ENCOUNTER — Ambulatory Visit (INDEPENDENT_AMBULATORY_CARE_PROVIDER_SITE_OTHER): Payer: Medicare HMO | Admitting: Vascular Surgery

## 2020-01-09 ENCOUNTER — Other Ambulatory Visit: Payer: Self-pay

## 2020-01-09 ENCOUNTER — Encounter (INDEPENDENT_AMBULATORY_CARE_PROVIDER_SITE_OTHER): Payer: Self-pay | Admitting: Vascular Surgery

## 2020-01-09 VITALS — BP 96/59 | HR 87 | Ht 63.0 in | Wt 175.0 lb

## 2020-01-09 DIAGNOSIS — I10 Essential (primary) hypertension: Secondary | ICD-10-CM

## 2020-01-09 DIAGNOSIS — I739 Peripheral vascular disease, unspecified: Secondary | ICD-10-CM | POA: Diagnosis not present

## 2020-01-09 DIAGNOSIS — I872 Venous insufficiency (chronic) (peripheral): Secondary | ICD-10-CM | POA: Diagnosis not present

## 2020-01-09 DIAGNOSIS — E782 Mixed hyperlipidemia: Secondary | ICD-10-CM | POA: Diagnosis not present

## 2020-01-09 DIAGNOSIS — J432 Centrilobular emphysema: Secondary | ICD-10-CM | POA: Diagnosis not present

## 2020-01-09 DIAGNOSIS — I25118 Atherosclerotic heart disease of native coronary artery with other forms of angina pectoris: Secondary | ICD-10-CM | POA: Diagnosis not present

## 2020-01-09 NOTE — Progress Notes (Signed)
MRN : [id number]  Matthew Miles. is a 86 y.o. (1929-01-24) male who presents with chief complaint of  Chief Complaint  Patient presents with  . New Patient (Initial Visit)    abnormal ABI  .  History of Present Illness:   The patient is seen for evaluation of painful lower extremities. Patient notes the pain is variable and not always associated with activity.  The pain is somewhat consistent day to day occurring on most days. The patient notes the pain also occurs with standing and routinely seems worse as the day wears on. The pain has been progressive over the past several years. The patient states these symptoms are causing  a profound negative impact on quality of life and daily activities.  The patient denies rest pain or dangling of an extremity off the side of the bed during the night for relief. No open wounds or sores at this time. No history of DVT or phlebitis. No prior interventions or surgeries.  There is a  history of back problems and DJD of the lumbar and sacral spine.   ABI's performed at [facility] are reviewed by me with the patient and they are essentially normal with triphasic signals   Current Meds  Medication Sig  . ACCU-CHEK GUIDE test strip TEST BLOOD SUGAR TWICE DAILY AS DIRECTED  . alfuzosin (UROXATRAL) 10 MG 24 hr tablet Take 1 tablet (10 mg total) by mouth daily with breakfast.  . aspirin 81 MG tablet Take 81 mg by mouth daily.   Marland Kitchen CALCIUM CITRATE-VITAMIN D PO Take 1 tablet by mouth daily. 1200 mg calcium  . carvedilol (COREG) 6.25 MG tablet TAKE 1 TABLET(6.25 MG) BY MOUTH TWICE DAILY  . clotrimazole (LOTRIMIN) 1 % cream Apply 1 application topically 2 (two) times daily.  . Cyanocobalamin (B-12) 1000 MCG TBCR Take 1 tablet by mouth daily.   . Dulaglutide (TRULICITY) 1.5 JM/4.2AS SOPN Inject 1.5 mg into the skin once a week.  . FEROSUL 325 (65 Fe) MG tablet TAKE 1 TABLET TWICE DAILY WITH MEALS  . JANUVIA 50 MG tablet   . JARDIANCE 10 MG TABS tablet   .  Lancets (ONETOUCH ULTRASOFT) lancets Use as instructed  . metFORMIN (GLUCOPHAGE-XR) 500 MG 24 hr tablet TAKE 2 TABLETS(1000 MG) BY MOUTH TWICE DAILY  . nitroGLYCERIN (NITROSTAT) 0.4 MG SL tablet Place 1 tablet (0.4 mg total) under the tongue every 5 (five) minutes as needed.  . pantoprazole (PROTONIX) 20 MG tablet TAKE 1 TABLET EVERY DAY  . potassium chloride (KLOR-CON) 10 MEQ tablet TAKE 1 TABLET(10 MEQ) BY MOUTH DAILY  . pravastatin (PRAVACHOL) 40 MG tablet TAKE 1 TABLET EVERY DAY  . PROAIR HFA 108 (90 Base) MCG/ACT inhaler INHALE 2 PUFFS INTO THE LUNGS EVERY 6 HOURS AS NEEDED FOR WHEEZING OR SHORTNESS OF BREATH  . SPIRIVA RESPIMAT 2.5 MCG/ACT AERS INHALE 2 PUFFS INTO THE LUNGS DAILY  . torsemide (DEMADEX) 10 MG tablet TAKE 1 TABLET(10 MG) BY MOUTH TWICE DAILY AS NEEDED  . Zinc Oxide 13 % CREA Apply as needed to the wound on your left buttocks    Past Medical History:  Diagnosis Date  . Allergy   . Arthritis   . CHF (congestive heart failure) ([place])   . Chicken pox   . Cholecystitis   . Colon polyps   . COPD (chronic obstructive pulmonary disease) ([place])   . Coronary artery disease   . Diabetes mellitus without complication ([place])   . Emphysema of lung ([place])   .  GERD (gastroesophageal reflux disease)   . Heart murmur   . Hematemesis/vomiting blood 04/10/11  . Hypercholesterolemia   . Mild hypertension   . Pancreatitis   . Prostate cancer ([place])   . Prostate cancer [facility])    radiation therapy   . Smoker   . Ulcer   . Upper GI bleed 1980    Past Surgical History:  Procedure Laterality Date  . APPENDECTOMY    . CARDIAC CATHETERIZATION  May 2002 and Feb 2013   [facility]; no stents   . CATARACT EXTRACTION    . CHOLECYSTECTOMY    . CIRCUMCISION    . COLONOSCOPY    . HEMORRHOID SURGERY    . SP CHOLECYSTOMY    . STOMACH SURGERY     bleeding ulcers, followed by Dr. [name]    Social History Social History   Tobacco Use  . Smoking status: Former Smoker    Packs/day: 1.00     Years: 65.00    Pack years: 65.00    Types: Cigarettes    Quit date: 04/10/2011    Years since quitting: 8.7  . 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 Vaping Use: Former  Substance Use Topics  . Alcohol use: Yes    Comment: 1 beer rarely  . Drug use: No    Family History Family History  Problem Relation Age of Onset  . Prostate cancer Father   . Liver cancer Brother   . Prostate cancer Brother   . Emphysema Sister        smoker  No family history of bleeding/clotting disorders, porphyria or autoimmune disease   Allergies  Allergen Reactions  . Sulfa Antibiotics     GI upset  . Tradjenta [Linagliptin] Other (See Comments)    Hair loss     REVIEW OF SYSTEMS (Negative unless checked)  Constitutional: [] Weight loss  [] Fever  [] Chills Cardiac: [] Chest pain   [] Chest pressure   [] Palpitations   [] Shortness of breath when laying flat   [] Shortness of breath with exertion. Vascular:  [x] Pain in legs with walking   [x] Pain in legs at rest  [] History of DVT   [] Phlebitis   [x] Swelling in legs   [] Varicose veins   [] Non-healing ulcers Pulmonary:   [] Uses home oxygen   [] Productive cough   [] Hemoptysis   [] Wheeze  [] COPD   [] Asthma Neurologic:  [] Dizziness   [] Seizures   [] History of stroke   [] History of TIA  [] Aphasia   [] Vissual changes   [] Weakness or numbness in arm   [] Weakness or numbness in leg Musculoskeletal:   [] Joint swelling   [x] Joint pain   [x] Low back pain Hematologic:  [] Easy bruising  [] Easy bleeding   [] Hypercoagulable state   [] Anemic Gastrointestinal:  [] Diarrhea   [] Vomiting  [] Gastroesophageal reflux/heartburn   [] Difficulty swallowing. Genitourinary:  [] Chronic kidney disease   [] Difficult urination  [] Frequent urination   [] Blood in urine Skin:  [] Rashes   [] Ulcers  Psychological:  [] History of anxiety   []  History of major depression.  Physical Examination  Vitals:   01/09/20 0825  BP: (!) 96/59  Pulse: 87  Weight: 175 lb  (79.4 kg)  Height: 5\' 3"  (1.6 m)   Body mass index is 31 kg/m. Gen: WD/WN, NAD Head: [place]/AT, No temporalis wasting.  Ear/Nose/Throat: Hearing grossly intact, nares w/o erythema or drainage, poor dentition Eyes: PER, EOMI, sclera nonicteric.  Neck: Supple, no masses.  No bruit or JVD.  Pulmonary:  Good air movement,  clear to auscultation bilaterally, no use of accessory muscles.  Cardiac: RRR, normal S1, S2, no Murmurs. Vascular: scattered varicosities present bilaterally.  Mild venous stasis changes to the legs bilaterally.  2+ soft pitting edema Vessel Right Left  Radial Palpable Palpable  PT Palpable Palpable  DP Palpable Palpable  Gastrointestinal: soft, non-distended. No guarding/no peritoneal signs.  Musculoskeletal: M/S 5/5 throughout.  No deformity or atrophy.  Neurologic: CN 2-12 intact. Pain and light touch intact in extremities.  Symmetrical.  Speech is fluent. Motor exam as listed above. Psychiatric: Judgment intact, Mood & affect appropriate for pt's clinical situation. Dermatologic: venous rashes no ulcers noted.  No changes consistent with cellulitis.  CBC Lab Results  Component Value Date   WBC 4.1 12/30/2019   HGB 11.2 (L) 12/30/2019   HCT 33.9 (L) 12/30/2019   MCV 87.1 12/30/2019   PLT 83.0 (L) 12/30/2019    BMET    Component Value Date/Time   NA 138 12/30/2019 1416   NA 140 10/29/2018 1025   NA 131 (L) 08/09/2012 1901   K 4.6 12/30/2019 1416   K 5.1 08/09/2012 1901   CL 103 12/30/2019 1416   CL 98 08/09/2012 1901   CO2 25 12/30/2019 1416   CO2 27 08/09/2012 1901   GLUCOSE 182 (H) 12/30/2019 1416   GLUCOSE 83 08/09/2012 1901   BUN 23 12/30/2019 1416   BUN 20 10/29/2018 1025   BUN 14 08/09/2012 1901   CREATININE 1.27 12/30/2019 1416   CREATININE 1.35 (H) 11/14/2016 1407   CALCIUM 8.8 12/30/2019 1416   CALCIUM 8.6 08/09/2012 1901   GFRNONAA 40 (L) 10/29/2018 1025   GFRNONAA >60 08/09/2012 1901   GFRAA 46 (L) 10/29/2018 1025   GFRAA >60 08/09/2012  1901   Estimated Creatinine Clearance: 36.8 mL/min (by C-G formula based on SCr of 1.27 mg/dL).  COAG Lab Results  Component Value Date   INR 1.1 07/23/2012   INR 1.2 07/18/2012   INR 1.1 05/20/2011    Radiology VAS [place] ABI WITH/WO TBI  Result Date: 01/06/2020 LOWER EXTREMITY DOPPLER STUDY Indications: Diminished pulses by palpation. High Risk Factors: Hypertension, Diabetes, past history of smoking.  Comparison Study: Prior study from 09/30/16 is available for comparison Performing Technologist: Sharion Dove RVS  Examination Guidelines: A complete evaluation includes at minimum, Doppler waveform signals and systolic blood pressure reading at the level of bilateral brachial, anterior tibial, and posterior tibial arteries, when vessel segments are accessible. Bilateral testing is considered an integral part of a complete examination. Photoelectric Plethysmograph (PPG) waveforms and toe systolic pressure readings are included as required and additional duplex testing as needed. Limited examinations for reoccurring indications may be performed as noted.  ABI Findings: +---------+------------------+-----+-----------+--------+ Right    Rt Pressure (mmHg)IndexWaveform   Comment  +---------+------------------+-----+-----------+--------+ Brachial 120                    multiphasic         +---------+------------------+-----+-----------+--------+ PTA      185               1.41 multiphasic         +---------+------------------+-----+-----------+--------+ DP       172               1.31 multiphasic         +---------+------------------+-----+-----------+--------+ Great Toe94                0.72                     +---------+------------------+-----+-----------+--------+ +---------+------------------+-----+-----------+-------+  Left     Lt Pressure (mmHg)IndexWaveform   Comment +---------+------------------+-----+-----------+-------+ Brachial 131                     multiphasic        +---------+------------------+-----+-----------+-------+ PTA      130               0.99 multiphasic        +---------+------------------+-----+-----------+-------+ DP       159               1.21 multiphasic        +---------+------------------+-----+-----------+-------+ Great Toe108               0.82                    +---------+------------------+-----+-----------+-------+ +-------+-----------+-----------+------------+------------+ ABI/TBIToday's ABIToday's TBIPrevious ABIPrevious TBI +-------+-----------+-----------+------------+------------+ Right  1.4        0.72       1.2         0.89         +-------+-----------+-----------+------------+------------+ Left   1.2        0.82       1.07        0.72         +-------+-----------+-----------+------------+------------+ Right ABIs appear increased compared to prior study on 09/30/16. Left ABIs appear increased compared to prior study on 09/30/16.  Summary: Right: Resting right ankle-brachial index indicates noncompressible right lower extremity arteries. The right toe-brachial index is normal. Left: Resting left ankle-brachial index is within normal range. No evidence of significant left lower extremity arterial disease. The left toe-brachial index is normal.  *See table(s) above for measurements and observations.  Electronically signed by Ruta Hinds MD on 01/06/2020 at 2:52:07 PM.    Final       Assessment/Plan 1. PAD (peripheral artery disease) (HCC) Recommend:  I do not find evidence of life style limiting vascular disease. The patient specifically denies life style limitation.  Previous noninvasive studies including ABI's of the legs do not identify critical vascular problems.  The patient should continue walking and begin a more formal exercise program. The patient should continue his antiplatelet therapy and aggressive treatment of the lipid abnormalities.  The patient should begin  wearing graduated compression socks 15-20 mmHg strength to control her mild edema.  Patient will follow-up with me on a PRN basis   2. Chronic venous insufficiency No surgery or intervention at this point in time.  I have reviewed my discussion with the patient regarding venous insufficiency and why it causes symptoms. I have discussed with the patient the chronic skin changes that accompany venous insufficiency and the long term sequela such as ulceration. Patient will contnue wearing graduated compression stockings on a daily basis, as this has provided excellent control of his edema. The patient will put the stockings on first thing in the morning and removing them in the evening. The patient is reminded not to sleep in the stockings.  In addition, behavioral modification including elevation during the day will be initiated. Exercise is strongly encouraged.  Given the patient's good control and lack of any problems regarding the venous insufficiency and lymphedema a lymph pump in not need at this time.  The patient will follow up with me PRN should anything change.  The patient voices agreement with this plan.   3. Coronary artery disease of native artery of native heart with stable angina pectoris (HCC) Continue cardiac and antihypertensive medications as  already ordered and reviewed, no changes at this time.  Continue statin as ordered and reviewed, no changes at this time  Nitrates PRN for chest pain   4. Essential hypertension Continue antihypertensive medications as already ordered, these medications have been reviewed and there are no changes at this time.   5. Centrilobular emphysema ([place]) Continue pulmonary medications and aerosols as already ordered, these medications have been reviewed and there are no changes at this time.     6. Mixed hyperlipidemia Continue statin as ordered and reviewed, no changes at this time     Hortencia Pilar, MD  01/09/2020 10:16  AM

## 2020-01-10 ENCOUNTER — Encounter (INDEPENDENT_AMBULATORY_CARE_PROVIDER_SITE_OTHER): Payer: Self-pay | Admitting: Vascular Surgery

## 2020-01-14 ENCOUNTER — Other Ambulatory Visit: Payer: Self-pay | Admitting: Family Medicine

## 2020-01-16 ENCOUNTER — Ambulatory Visit (INDEPENDENT_AMBULATORY_CARE_PROVIDER_SITE_OTHER): Payer: Medicare HMO | Admitting: Pharmacist

## 2020-01-16 DIAGNOSIS — I10 Essential (primary) hypertension: Secondary | ICD-10-CM

## 2020-01-16 DIAGNOSIS — J449 Chronic obstructive pulmonary disease, unspecified: Secondary | ICD-10-CM

## 2020-01-16 DIAGNOSIS — N401 Enlarged prostate with lower urinary tract symptoms: Secondary | ICD-10-CM

## 2020-01-16 DIAGNOSIS — E1165 Type 2 diabetes mellitus with hyperglycemia: Secondary | ICD-10-CM

## 2020-01-16 DIAGNOSIS — I739 Peripheral vascular disease, unspecified: Secondary | ICD-10-CM

## 2020-01-16 DIAGNOSIS — E782 Mixed hyperlipidemia: Secondary | ICD-10-CM

## 2020-01-16 NOTE — Chronic Care Management (AMB) (Signed)
Chronic Care Management   Follow Up Note   01/16/2020 Name: Matthew Miles. MRN: [id number] DOB: [birth year]  Referred by: [name], MD Reason for referral : Chronic Care Management (Medication Management)   Matthew Miles. is a 86 y.o. year old male who is a primary care patient of [name], [name], MD. The CCM team was consulted for assistance with chronic disease management and care coordination needs.    Contacted patient for medication management review.   Review of patient status, including review of consultants reports, relevant laboratory and other test results, and collaboration with appropriate care team members and the patient's provider was performed as part of comprehensive patient evaluation and provision of chronic care management services.    SDOH (Social Determinants of Health) assessments performed: Yes See Care Plan activities for detailed interventions related to SDOH)  SDOH Interventions     Most Recent Value  SDOH Interventions  Financial Strain Interventions Intervention Not Indicated       Outpatient Encounter Medications as of 01/16/2020  Medication Sig Note   ACCU-CHEK GUIDE test strip TEST BLOOD SUGAR TWICE DAILY AS DIRECTED    alfuzosin (UROXATRAL) 10 MG 24 hr tablet Take 1 tablet (10 mg total) by mouth daily with breakfast.    aspirin 81 MG tablet Take 81 mg by mouth daily.     CALCIUM CITRATE-VITAMIN D PO Take 1 tablet by mouth daily. 1200 mg calcium, vitamin D3    carvedilol (COREG) 6.25 MG tablet TAKE 1 TABLET(6.25 MG) BY MOUTH TWICE DAILY    Cyanocobalamin (B-12) 1000 MCG TBCR Take 1 tablet by mouth daily.     Dulaglutide (TRULICITY) 1.5 XK/5.5VZ SOPN Inject 1.5 mg into the skin once a week.    FEROSUL 325 (65 Fe) MG tablet TAKE 1 TABLET TWICE DAILY WITH MEALS    Lancets (ONETOUCH ULTRASOFT) lancets Use as instructed    metFORMIN (GLUCOPHAGE-XR) 500 MG 24 hr tablet TAKE 2 TABLETS(1000 MG) BY MOUTH TWICE DAILY     pantoprazole (PROTONIX) 20 MG tablet TAKE 1 TABLET EVERY DAY    potassium chloride (KLOR-CON) 10 MEQ tablet TAKE 1 TABLET (10 MEQ) BY MOUTH DAILY    pravastatin (PRAVACHOL) 40 MG tablet TAKE 1 TABLET EVERY DAY    SPIRIVA RESPIMAT 2.5 MCG/ACT AERS INHALE 2 PUFFS INTO THE LUNGS DAILY 01/16/2020: Using PRN   torsemide (DEMADEX) 10 MG tablet TAKE 1 TABLET(10 MG) BY MOUTH TWICE DAILY AS NEEDED    clotrimazole (LOTRIMIN) 1 % cream Apply 1 application topically 2 (two) times daily.    nitroGLYCERIN (NITROSTAT) 0.4 MG SL tablet Place 1 tablet (0.4 mg total) under the tongue every 5 (five) minutes as needed.    PROAIR HFA 108 (90 Base) MCG/ACT inhaler INHALE 2 PUFFS INTO THE LUNGS EVERY 6 HOURS AS NEEDED FOR WHEEZING OR SHORTNESS OF BREATH    Zinc Oxide 13 % CREA Apply as needed to the wound on your left buttocks    [DISCONTINUED] JANUVIA 50 MG tablet     [DISCONTINUED] JARDIANCE 10 MG TABS tablet     [DISCONTINUED] potassium chloride (KLOR-CON) 10 MEQ tablet TAKE 1 TABLET(10 MEQ) BY MOUTH DAILY    No facility-administered encounter medications on file as of 01/16/2020.     Objective:   Goals Addressed              This Visit's Progress     Patient Stated     PharmD "I want to work on my sugars" (pt-stated)  CARE PLAN ENTRY (see longtitudinal plan of care for additional care plan information)  Current Barriers:   Social, financial, community barriers:  o Denies any concerns at this time. Notes he is "feeling well"  Diabetes: CONTROLLED, but medication regimen has been changed since; complicated by chronic medical conditions including HTN, HLD, hx prostate cancer, most recent A1c 6.6%  Most recent eGFR: ~41 mL/min (per Care Everywhere)  Current antihyperglycemic regimen: metformin XR 3557 mg BID, Trulicity 1.5 mg weekly  Current glucose readings:  o Fastings: 121-133s o 2 hour after supper: 180-200s  Current meal patterns:  o Does endorse continued decrease in  appetite from Trulicity. Notes that he likes to go to [employer] and get a grilled chicken plate, but can't finish the potatoes   Cardiovascular risk reduction (follows w/ [name]) o Current hypertensive regimen: Carvedilol 6.25 mg BID, torsemide 10 mg BID PRN - evening dose is PRN;  - Home readings: this morning was 127/57, HR 82 o Current hyperlipidemia regimen: pravastatin 40 mg daily; last LDL at goal <70 o Current antiplatelet regimen: ASA 81 mg daily   Hx prostate cancer: follows w/ [name]; alfuzosin 10 mg daily; has an appt tomorrow for f/u; when going through medication bottles for review, he notes he may have been taking it twice   COPD: Spiriva 2.5 mcg 2 puffs PRN - using less frequently with cooler weather; infrequent PRN albuterol   Supplements: Vitamin B12 1000 mcg daily, ferrous sulfate 325 mg QAM o Asks today if ferrous sulfate is causing black stools, also asks if it causes episodes of fecal urgency. Does note that these episodes happen after he has eaten out  Pharmacist Clinical Goal(s):   Over the next 90 days, patient will work with PharmD and primary care provider to address optimized medication management  Interventions:  Comprehensive medication review performed, medication list updated in electronic medical record  Inter-disciplinary care team collaboration (see longitudinal plan of care)  Mailing patient medication list to use to fill weekly pill box. If patient has been taking 2 alfuzosin daily, this may be contributing to low BP /risk of orthostatic hypotension. Will continue to monitor  Reviewed goal A1c, goal fasting, and goal 2 hour post prandial glucose.   Reviewed goal BP. Praised for medication adherence   Reviewed goal LDL. Praised for medication adherence  Patient Self Care Activities:   Patient will check blood glucose daily, document, and provide at future appointments  Patient will check BP periodically, document, and provide at  future appointments  Patient will take medications as prescribed using a pill box  Patient will report any questions or concerns to provider   Please see past updates related to this goal by clicking on the "Past Updates" button in the selected goal          Plan:  - Scheduled f/u call in ~ 4 weeks  Catie Darnelle Maffucci, PharmD, [place], [place] Pharmacist [place] [phone]

## 2020-01-16 NOTE — Patient Instructions (Addendum)
Matthew Miles,   It was great talking with you today! Accidentally taking 2 of the alfuzosin may be contributing to your low blood pressures. Please keep checking your blood pressures at home and writing these down. We can discuss at our next appointment.  You're also due for follow up with [name]!  Here is how you should take your medications:   Morning: - Aspirin - Carvedilol - Torsemide - Metformin - 2 tablets - Alfuzosin - Ferrous sulfate - Calcium + Vitamin D  Evening: - Carvedilol - Torsemide - if needed - Metformin - 2 tablets - Pravastatin  Weekly - Trulicity    Feel free to call me with any questions or concerns. I look forward to our next call!  Catie Darnelle Maffucci, PharmD, [place], CPP Direct line: [phone] Clinic phone: [phone]  Visit Information  Goals Addressed              This Visit's Progress     Patient Stated   .  PharmD "I want to work on my sugars" (pt-stated)        [name] (see longtitudinal plan of care for additional care plan information)  Current Barriers:  . Social, financial, community barriers:  o Denies any concerns at this time. Notes he is "feeling well" . Diabetes: CONTROLLED, but medication regimen has been changed since; complicated by chronic medical conditions including HTN, HLD, hx prostate cancer, most recent A1c 6.6% . Most recent eGFR: ~41 mL/min (per Care Everywhere) . Current antihyperglycemic regimen: metformin XR 1027 mg BID, Trulicity 1.5 mg weekly . Current glucose readings:  o Fastings: 121-133s o 2 hour after supper: 180-200s . Current meal patterns:  o Does endorse continued decrease in appetite from Trulicity. Notes that he likes to go to [employer] and get a grilled chicken plate, but can't finish the potatoes  . Cardiovascular risk reduction (follows w/ [name]) o Current hypertensive regimen: Carvedilol 6.25 mg BID, torsemide 10 mg BID PRN - evening dose is PRN;  - Home readings: this  morning was 127/57, HR 82 o Current hyperlipidemia regimen: pravastatin 40 mg daily; last LDL at goal <70 o Current antiplatelet regimen: ASA 81 mg daily  . Hx prostate cancer: follows w/ [name]; alfuzosin 10 mg daily; has an appt tomorrow for f/u; when going through medication bottles for review, he notes he may have been taking it twice  . COPD: Spiriva 2.5 mcg 2 puffs PRN - using less frequently with cooler weather; infrequent PRN albuterol  . Supplements: Vitamin B12 1000 mcg daily, ferrous sulfate 325 mg QAM o Asks today if ferrous sulfate is causing black stools, also asks if it causes episodes of fecal urgency. Does note that these episodes happen after he has eaten out  Pharmacist Clinical Goal(s):  Marland Kitchen Over the next 90 days, patient will work with PharmD and primary care provider to address optimized medication management  Interventions: . Comprehensive medication review performed, medication list updated in electronic medical record . Inter-disciplinary care team collaboration (see longitudinal plan of care) . Mailing patient medication list to use to fill weekly pill box. If patient has been taking 2 alfuzosin daily, this may be contributing to low BP /risk of orthostatic hypotension. Will continue to monitor . Reviewed goal A1c, goal fasting, and goal 2 hour post prandial glucose.  . Reviewed goal BP. Praised for medication adherence  . Reviewed goal LDL. Praised for medication adherence  Patient Self Care Activities:  . Patient will check blood glucose  daily, document, and provide at future appointments . Patient will check BP periodically, document, and provide at future appointments . Patient will take medications as prescribed using a pill box . Patient will report any questions or concerns to provider   Please see past updates related to this goal by clicking on the "Past Updates" button in the selected goal         The patient verbalized understanding of  instructions provided today and agreed to receive a mailed copy of patient instruction and/or educational materials.  Plan:  - Scheduled f/u call in ~ 4 weeks  Catie Darnelle Maffucci, PharmD, [place], [place] Pharmacist [place] [phone]

## 2020-01-17 DIAGNOSIS — R161 Splenomegaly, not elsewhere classified: Secondary | ICD-10-CM | POA: Diagnosis not present

## 2020-01-17 DIAGNOSIS — C8307 Small cell B-cell lymphoma, spleen: Secondary | ICD-10-CM | POA: Diagnosis not present

## 2020-01-22 NOTE — Telephone Encounter (Signed)
Error.  Clay Solum,cma  

## 2020-01-24 ENCOUNTER — Ambulatory Visit: Payer: Medicare HMO | Admitting: Pharmacist

## 2020-01-24 DIAGNOSIS — E1165 Type 2 diabetes mellitus with hyperglycemia: Secondary | ICD-10-CM

## 2020-01-24 DIAGNOSIS — N401 Enlarged prostate with lower urinary tract symptoms: Secondary | ICD-10-CM

## 2020-01-24 DIAGNOSIS — I739 Peripheral vascular disease, unspecified: Secondary | ICD-10-CM

## 2020-01-24 DIAGNOSIS — E782 Mixed hyperlipidemia: Secondary | ICD-10-CM

## 2020-01-24 NOTE — Chronic Care Management (AMB) (Signed)
Chronic Care Management   Follow Up Note   01/24/2020 Name: Matthew Miles. MRN: [id number] DOB: 06-24-1928  Referred by: [name], MD Reason for referral : Chronic Care Management (Medication Management)   Matthew Miles. is a 86 y.o. year old male who is a primary care patient of [name], [name], MD. The CCM team was consulted for assistance with chronic disease management and care coordination needs.    Received call from patient's daughter, [name], wanting to review medications  Review of patient status, including review of consultants reports, relevant laboratory and other test results, and collaboration with appropriate care team members and the patient's provider was performed as part of comprehensive patient evaluation and provision of chronic care management services.    SDOH (Social Determinants of Health) assessments performed: Yes See Care Plan activities for detailed interventions related to [facility])     Outpatient Encounter Medications as of 01/24/2020  Medication Sig Note   alfuzosin (UROXATRAL) 10 MG 24 hr tablet Take 1 tablet (10 mg total) by mouth daily with breakfast.    aspirin 81 MG tablet Take 81 mg by mouth daily.     CALCIUM CITRATE-VITAMIN D PO Take 1 tablet by mouth daily. 1200 mg calcium, vitamin D3    carvedilol (COREG) 6.25 MG tablet TAKE 1 TABLET(6.25 MG) BY MOUTH TWICE DAILY    clotrimazole (LOTRIMIN) 1 % cream Apply 1 application topically 2 (two) times daily.    Cyanocobalamin (B-12) 1000 MCG TBCR Take 1 tablet by mouth daily.     Dulaglutide (TRULICITY) 1.5 UX/3.2GM SOPN Inject 1.5 mg into the skin once a week.    FEROSUL 325 (65 Fe) MG tablet TAKE 1 TABLET TWICE DAILY WITH MEALS    Lancets (ONETOUCH ULTRASOFT) lancets Use as instructed    metFORMIN (GLUCOPHAGE-XR) 500 MG 24 hr tablet TAKE 2 TABLETS(1000 MG) BY MOUTH TWICE DAILY    pantoprazole (PROTONIX) 20 MG tablet TAKE 1 TABLET EVERY DAY    potassium chloride  (KLOR-CON) 10 MEQ tablet TAKE 1 TABLET (10 MEQ) BY MOUTH DAILY    pravastatin (PRAVACHOL) 40 MG tablet TAKE 1 TABLET EVERY DAY    PROAIR HFA 108 (90 Base) MCG/ACT inhaler INHALE 2 PUFFS INTO THE LUNGS EVERY 6 HOURS AS NEEDED FOR WHEEZING OR SHORTNESS OF BREATH    SPIRIVA RESPIMAT 2.5 MCG/ACT AERS INHALE 2 PUFFS INTO THE LUNGS DAILY 01/16/2020: Using PRN   torsemide (DEMADEX) 10 MG tablet TAKE 1 TABLET(10 MG) BY MOUTH TWICE DAILY AS NEEDED    Zinc Oxide 13 % CREA Apply as needed to the wound on your left buttocks    ACCU-CHEK GUIDE test strip TEST BLOOD SUGAR TWICE DAILY AS DIRECTED    nitroGLYCERIN (NITROSTAT) 0.4 MG SL tablet Place 1 tablet (0.4 mg total) under the tongue every 5 (five) minutes as needed.    No facility-administered encounter medications on file as of 01/24/2020.     Objective:   Goals Addressed              This Visit's Progress     Patient Stated     PharmD "I want to work on my sugars" (pt-stated)        [place] (see longtitudinal plan of care for additional care plan information)  Current Barriers:   Social, financial, community barriers:  o Received call from patient's daughter, [name], wanting to review medications. She and her brother plan on going through the patient's medication cabinet this weekend to ensure that he  is taking his medications appropriately.   Diabetes: CONTROLLED, but medication regimen has been changed since; complicated by chronic medical conditions including HTN, HLD, hx prostate cancer, most recent A1c 6.6%  Most recent eGFR: ~41 mL/min (per Care Everywhere)  Current antihyperglycemic regimen: metformin XR 4830 mg BID, Trulicity 1.5 mg weekly  Cardiovascular risk reduction (follows w/ [name]) o Current hypertensive regimen: Carvedilol 6.25 mg BID, torsemide 10 mg BID PRN - evening dose is PRN;  o Current hyperlipidemia regimen: pravastatin 40 mg daily; last LDL at goal <70 o Current antiplatelet regimen: ASA  81 mg daily   Hx prostate cancer: follows w/ [name]; alfuzosin 10 mg daily  COPD: Spiriva 2.5 mcg 2 puffs PRN - using less frequently with cooler weather; infrequent PRN albuterol   Supplements: Vitamin B12 1000 mcg daily, ferrous sulfate 325 mg QAM  Pharmacist Clinical Goal(s):   Over the next 90 days, patient will work with PharmD and primary care provider to address optimized medication management  Interventions:  Reviewed medication list w/ [name]. Reviewed that recent list has been mailed to patient. Encouraged to call back next week with any questions.   Patient Self Care Activities:   Patient will check blood glucose daily, document, and provide at future appointments  Patient will check BP periodically, document, and provide at future appointments  Patient will take medications as prescribed using a pill box  Patient will report any questions or concerns to provider   Please see past updates related to this goal by clicking on the "Past Updates" button in the selected goal          Plan:  - Will outreach as previously scheduled  Catie Darnelle Maffucci, PharmD, [place], [place] Pharmacist [place] [phone]

## 2020-01-24 NOTE — Patient Instructions (Signed)
Visit Information  Goals Addressed              This Visit's Progress     Patient Stated   .  PharmD "I want to work on my sugars" (pt-stated)        [place] (see longtitudinal plan of care for additional care plan information)  Current Barriers:  . Social, financial, community barriers:  o Received call from patient's daughter, [name], wanting to review medications. She and her brother plan on going through the patient's medication cabinet this weekend to ensure that he is taking his medications appropriately.  . Diabetes: CONTROLLED, but medication regimen has been changed since; complicated by chronic medical conditions including HTN, HLD, hx prostate cancer, most recent A1c 6.6% . Most recent eGFR: ~41 mL/min (per Care Everywhere) . Current antihyperglycemic regimen: metformin XR 7341 mg BID, Trulicity 1.5 mg weekly . Cardiovascular risk reduction (follows w/ [name]) o Current hypertensive regimen: Carvedilol 6.25 mg BID, torsemide 10 mg BID PRN - evening dose is PRN;  o Current hyperlipidemia regimen: pravastatin 40 mg daily; last LDL at goal <70 o Current antiplatelet regimen: ASA 81 mg daily  . Hx prostate cancer: follows w/ [name]; alfuzosin 10 mg daily . COPD: Spiriva 2.5 mcg 2 puffs PRN - using less frequently with cooler weather; infrequent PRN albuterol  . Supplements: Vitamin B12 1000 mcg daily, ferrous sulfate 325 mg QAM  Pharmacist Clinical Goal(s):  Marland Kitchen Over the next 90 days, patient will work with PharmD and primary care provider to address optimized medication management  Interventions: . Reviewed medication list w/ [name]. Reviewed that recent list has been mailed to patient. Encouraged to call back next week with any questions.   Patient Self Care Activities:  . Patient will check blood glucose daily, document, and provide at future appointments . Patient will check BP periodically, document, and provide at future appointments . Patient will  take medications as prescribed using a pill box . Patient will report any questions or concerns to provider   Please see past updates related to this goal by clicking on the "Past Updates" button in the selected goal         The patient verbalized understanding of instructions provided today and declined a print copy of patient instruction materials.    Plan:  - Will outreach as previously scheduled  Catie Darnelle Maffucci, PharmD, [place], [place] Pharmacist [place] [phone]

## 2020-02-01 ENCOUNTER — Other Ambulatory Visit: Payer: Self-pay | Admitting: Cardiovascular Disease

## 2020-02-05 ENCOUNTER — Other Ambulatory Visit: Payer: Self-pay | Admitting: Family Medicine

## 2020-02-06 NOTE — Progress Notes (Signed)
Office Visit    Patient Name: Matthew Miles. Date of Encounter: 02/07/2020  Primary Care Provider:  [name], MD Primary Cardiologist:  No primary care provider on file. Electrophysiologist:  None   Chief Complaint    Matthew Miles. is a 86 y.o. male with a hx of chronic diastolic heart failure, HLD, CAD, DM2, pulmonary hypertension, tobacco use, COPD presents today for follow-up of diastolic heart failure with chief complaint of fluid retention.  Past Medical History    Past Medical History:  Diagnosis Date  . Allergy   . Arthritis   . CHF (congestive heart failure) ([place])   . Chicken pox   . Cholecystitis   . Colon polyps   . COPD (chronic obstructive pulmonary disease) ([place])   . Coronary artery disease   . Diabetes mellitus without complication ([place])   . Emphysema of lung ([place])   . GERD (gastroesophageal reflux disease)   . Heart murmur   . Hematemesis/vomiting blood 04/10/11  . Hypercholesterolemia   . Mild hypertension   . Pancreatitis   . Prostate cancer ([place])   . Prostate cancer [facility])    radiation therapy   . Smoker   . Ulcer   . Upper GI bleed 1980   Past Surgical History:  Procedure Laterality Date  . APPENDECTOMY    . CARDIAC CATHETERIZATION  May 2002 and Feb 2013   [facility]; no stents   . CATARACT EXTRACTION    . CHOLECYSTECTOMY    . CIRCUMCISION    . COLONOSCOPY    . HEMORRHOID SURGERY    . SP CHOLECYSTOMY    . STOMACH SURGERY     bleeding ulcers, followed by Dr. [name]    Allergies  Allergies  Allergen Reactions  . Sulfa Antibiotics     GI upset  . Tradjenta [Linagliptin] Other (See Comments)    Hair loss    History of Present Illness    Matthew Miles. is a 86 y.o. male with a hx of chronic diastolic heart failure, HLD, CAD, DM2, pulmonary hypertension, tobacco use, COPD, prostate cancer now managed on hormonal agents.  He was last seen 05/06/2019 by [name].  Previous cardiac catheterization February 2013 with  pulmonary hypertension, stable severe coronary disease, normal EF.  Rates she was significantly volume overload with a wedge pressure of 24, moderate pulmonary hypertension.  Catheterization with known occluded LAD and no other significant stenoses requiring intervention.  Repeat echo 10/2013 with EF 55 to 60%, grade 1 diastolic dysfunction, mild AI, mild MR, LA mildly dilated, PASP mildly elevated with PA peak pressure 37 mmHg.  He has been seen by vascular surgery for pain in his lower extremities.  History of back problems and DJD of lumbar and sacral spine.  ABIs performed 01/06/2020 were essentially normal with triphasic signals.  He was recommended for follow-up PRN.  He was seen by hematology 01/17/2020 for lymphadenopathy, splenomegaly, cytopenias (anemia/thrombocytopenia)  As he was asymptomatic with no GI symptoms, good appetite, no abdominal pain he was recommended for surveillance with annual imaging and follow-up in 3 months.  Presents today for 27-month follow-up with complaint of dyspnea on exertion, lower extremity edema for 1 week.  His weight got down 269 pounds on his home scale.  However, he stopped taking his evening dose of torsemide as he was bothered by nocturia.  He weighed himself and he was of 275 pounds.  He resumed taking it twice per day approximately 1 week ago.  His weight on  his scale this morning was 173 pounds.  On our scale in clinic is 176 pounds which is actually down 8 pounds from his office visit January 2021.  Tells me he is bothered by having to get up to use the restroom at night. He wonders if he can take both of his torsemide in the morning.  He endorses mild orthopnea but no PND.  EKGs/Labs/Other Studies Reviewed:   The following studies were reviewed today:  Echo 10/2013 Left ventricle: The cavity size was normal. There was mild    concentric hypertrophy. Systolic function was normal. The    estimated ejection fraction was in the range of 55% to 60%. Wall      motion was normal; there were no regional wall motion    abnormalities. Doppler parameters are consistent with abnormal    left ventricular relaxation (grade 1 diastolic dysfunction).  - Aortic valve: There was mild regurgitation.  - Mitral valve: There was mild regurgitation.  - Left atrium: The atrium was mildly dilated.  - Right ventricle: Systolic function was normal.  - Pulmonary arteries: Systolic pressure was midlly elevated. PA    peak pressure: 37 mm Hg (S).   Echo 05/2011 Left ventricle: The cavity size was normal. There was mild    concentric hypertrophy. Systolic function was normal. The    estimated ejection fraction was in the range of 55% to    65%. Wall motion was normal; there were no regional wall    motion abnormalities. Doppler parameters are consistent    with abnormal left ventricular relaxation (grade 1    diastolic dysfunction).  - Aortic valve: Mild regurgitation.  - Mitral valve: Mild regurgitation.  - Left atrium: The atrium was mildly dilated.  - Pulmonary arteries: The main pulmonary artery was    moderately dilated. Systolic pressure was elevated    consistent with severe pulmonary HTN. PA peak pressure:    17mm Hg (S).  Transthoracic echocardiography.  M-mode, complete 2D,  spectral Doppler, and color Doppler.  Height:  Height:  157.5cm. Height: 62in.  Weight:  Weight: 86.2kg. Weight:  189.6lb.  Body mass index:  BMI: 34.8kg/m^2.  Body surface  area:    BSA: 1.55m^2.  Blood pressure:     96/60.  Patient  status:  Outpatient.   EKG:  EKG is ordered today.  The ekg ordered today demonstrates NSR 84 bpm with left axis deviation, stable prior anterior infarct, PAC-no acute ST/T wave changes.  Recent Labs: 12/30/2019: ALT 12; BUN 23; Creatinine, Ser 1.27; Hemoglobin 11.2; Platelets 83.0; Potassium 4.6; Sodium 138; TSH 4.92  Recent Lipid Panel    Component Value Date/Time   CHOL 104 03/16/2018 1354   TRIG 118.0 03/16/2018 1354   HDL 28.50 (L)  03/16/2018 1354   CHOLHDL 4 03/16/2018 1354   VLDL 23.6 03/16/2018 1354   LDLCALC 51 03/16/2018 1354   LDLDIRECT 80.0 08/05/2015 1405    Home Medications   Current Meds  Medication Sig  . ACCU-CHEK GUIDE test strip TEST BLOOD SUGAR TWICE DAILY AS DIRECTED  . alfuzosin (UROXATRAL) 10 MG 24 hr tablet Take 1 tablet (10 mg total) by mouth daily with breakfast.  . aspirin 81 MG tablet Take 81 mg by mouth daily.   Marland Kitchen CALCIUM CITRATE-VITAMIN D PO Take 1 tablet by mouth daily. 1200 mg calcium, vitamin D3  . carvedilol (COREG) 6.25 MG tablet TAKE 1 TABLET(6.25 MG) BY MOUTH TWICE DAILY  . clotrimazole (LOTRIMIN) 1 % cream Apply 1 application topically 2 (two)  times daily.  . Cyanocobalamin (B-12) 1000 MCG TBCR Take 1 tablet by mouth daily.   . Dulaglutide (TRULICITY) 1.5 KP/5.4SF SOPN Inject 1.5 mg into the skin once a week.  . FEROSUL 325 (65 Fe) MG tablet TAKE 1 TABLET TWICE DAILY WITH MEALS  . Lancets (ONETOUCH ULTRASOFT) lancets Use as instructed  . metFORMIN (GLUCOPHAGE-XR) 500 MG 24 hr tablet TAKE 2 TABLETS(1000 MG) BY MOUTH TWICE DAILY  . nitroGLYCERIN (NITROSTAT) 0.4 MG SL tablet Place 1 tablet (0.4 mg total) under the tongue every 5 (five) minutes as needed.  . pantoprazole (PROTONIX) 20 MG tablet TAKE 1 TABLET EVERY DAY  . potassium chloride (KLOR-CON) 10 MEQ tablet TAKE 1 TABLET (10 MEQ) BY MOUTH DAILY  . pravastatin (PRAVACHOL) 40 MG tablet TAKE 1 TABLET EVERY DAY  . PROAIR HFA 108 (90 Base) MCG/ACT inhaler INHALE 2 PUFFS INTO THE LUNGS EVERY 6 HOURS AS NEEDED FOR WHEEZING OR SHORTNESS OF BREATH  . SPIRIVA RESPIMAT 2.5 MCG/ACT AERS INHALE 2 PUFFS INTO THE LUNGS DAILY  . torsemide (DEMADEX) 10 MG tablet TAKE 1 TABLET(10 MG) BY MOUTH TWICE DAILY AS NEEDED  . Zinc Oxide 13 % CREA Apply as needed to the wound on your left buttocks  . [DISCONTINUED] JARDIANCE 10 MG TABS tablet      Review of Systems  All other systems reviewed and are otherwise negative except as noted  above.  Physical Exam    VS:  BP (!) 126/40 (BP Location: Left Arm, Patient Position: Sitting, Cuff Size: Normal)   Pulse 84   Ht 5\' 3"  (1.6 m)   Wt 176 lb (79.8 kg)   SpO2 92%   BMI 31.18 kg/m  , BMI Body mass index is 31.18 kg/m.  Wt Readings from Last 3 Encounters:  02/07/20 176 lb (79.8 kg)  01/09/20 175 lb (79.4 kg)  01/01/20 174 lb 3.2 oz (79 kg)    GEN: Well nourished, well developed, in no acute distress. HEENT: normal. Neck: Supple, no JVD, carotid bruits, or masses. Cardiac: RRR, no murmurs, rubs, or gallops. No clubbing, cyanosis.  Trace bilateral lower extremity edema..  Radials/DP/PT 2+ and equal bilaterally.  Respiratory:  Respirations regular and unlabored, clear to auscultation bilaterally. GI: Soft, nontender, nondistended. MS: No deformity or atrophy. Skin: Warm and dry, no rash. Neuro:  Strength and sensation are intact. Psych: Normal affect.  Assessment & Plan    1. Chronic diastolic heart failure-He reports 2-week history of increased lower extremity edema, dyspnea on exertion, fatigue in the setting of stopping his evening dose of torsemide as he was bothered by nocturia.  Reds vest today 44%.  Trace bilateral lower extremity edema.  Change torsemide to 20 mg daily in the morning.  Educated on less than 2 L fluid restriction as well as low-sodium diet.  Plan [facility] of his renal function.  2. Pulmonary hypertension-torsemide 20 mg daily, as above.  3. HTN- BP well controlled. Continue current antihypertensive regimen.  Denies recurrent hypotension since discontinuation of Jardiance.  4. CAD-EKG today with no acute ST/T wave changes.  No symptoms concerning for angina.  No indication for ischemic evaluation at this time.  GDMT includes aspirin, beta-blocker, statin,PRN nitroglycerin.  5. HLD-continue pravastatin 40 mg daily.  6. DM2- 12/30/19 with A1c 6.8. Previously had to stop Jardiance due to  hypotension.   Disposition: Follow up in 6 month(s) with [name] or APP  Signed, Loel Dubonnet, NP 02/07/2020, 2:00  PM [facility]

## 2020-02-07 ENCOUNTER — Other Ambulatory Visit: Payer: Self-pay

## 2020-02-07 ENCOUNTER — Encounter: Payer: Self-pay | Admitting: Family

## 2020-02-07 ENCOUNTER — Telehealth: Payer: Self-pay

## 2020-02-07 ENCOUNTER — Ambulatory Visit: Payer: Medicare HMO | Admitting: Family

## 2020-02-07 VITALS — BP 126/40 | HR 84 | Ht 63.0 in | Wt 176.0 lb

## 2020-02-07 DIAGNOSIS — I25118 Atherosclerotic heart disease of native coronary artery with other forms of angina pectoris: Secondary | ICD-10-CM

## 2020-02-07 DIAGNOSIS — I5032 Chronic diastolic (congestive) heart failure: Secondary | ICD-10-CM

## 2020-02-07 DIAGNOSIS — I272 Pulmonary hypertension, unspecified: Secondary | ICD-10-CM | POA: Diagnosis not present

## 2020-02-07 DIAGNOSIS — I10 Essential (primary) hypertension: Secondary | ICD-10-CM | POA: Diagnosis not present

## 2020-02-07 NOTE — Telephone Encounter (Signed)
Patient daughter calling.  Stated patient was seen today - wants to know if fluid is on patients heart , lungs, or both.

## 2020-02-07 NOTE — Telephone Encounter (Signed)
Called daughter back.  We discussed that there are signs of volume overload with the elevated Reds Vest and we did adjust his Torsemide. We discussed that there is no evidence of pleural effusion nor pericardial effusion. We will treat the volume overload with Torsemide. She verbalized understanding of the low salt, and fluid restriction. She will call us to report weight gain of 2 pounds overnight of 5 pounds in one week.   Loel Dubonnet, NP

## 2020-02-07 NOTE — Patient Instructions (Addendum)
Medication Instructions:  Your physician has recommended you make the following change in your medication:   CHANGE Torsemide to 20mg  (two tablets) together in the morning.  *If you need a refill on your cardiac medications before your next appointment, please call your pharmacy*   Lab Work: Your physician recommends that you return for lab work in:  1-2 weeks. (BMET, BNP)  Testing/Procedures: Your EKG today was stable compared to previous.  Follow-Up: At [facility], you and your health needs are our priority.  As part of our continuing mission to provide you with exceptional heart care, we have created designated Provider Care Teams.  These Care Teams include your primary Cardiologist (physician) and Advanced Practice Providers (APPs -  Physician Assistants and Nurse Practitioners) who all work together to provide you with the care you need, when you need it.  We recommend signing up for the patient portal called "MyChart".  Sign up information is provided on this After Visit Summary.  MyChart is used to connect with patients for Virtual Visits (Telemedicine).  Patients are able to view lab/test results, encounter notes, upcoming appointments, etc.  Non-urgent messages can be sent to your provider as well.   To learn more about what you can do with MyChart, go to [email or web address].    Your next appointment:   3 month(s)  The format for your next appointment:   In Person  Provider:   You may see Ida Rogue, MD or one of the following Advanced Practice Providers on your designated Care Team:    Murray Hodgkins, NP  Christell Faith, PA-C  Marrianne Mood, PA-C  Laurann Montana, NP  Cadence Kathlen Mody, [place]  Other Instructions  Drink less than 2 L of fluid per day (67 ounces).  Continue low salt diet.

## 2020-02-19 ENCOUNTER — Other Ambulatory Visit
Admission: RE | Admit: 2020-02-19 | Discharge: 2020-02-19 | Disposition: A | Discharge status: Home / Self Care | Payer: Medicare HMO | Attending: Family | Admitting: Family

## 2020-02-19 DIAGNOSIS — I5032 Chronic diastolic (congestive) heart failure: Secondary | ICD-10-CM | POA: Diagnosis not present

## 2020-02-19 LAB — BASIC METABOLIC PANEL
Anion gap: 10 (ref 5–15)
BUN: 20 mg/dL (ref 8–23)
CO2: 23 mmol/L (ref 22–32)
Calcium: 8.4 mg/dL — ABNORMAL LOW (ref 8.9–10.3)
Chloride: 105 mmol/L (ref 98–111)
Creatinine, Ser: 1.01 mg/dL (ref 0.61–1.24)
GFR, Estimated: >60 mL/min (ref >60)
Glucose, Bld: 178 mg/dL — ABNORMAL HIGH (ref 70–99)
Potassium: 4.2 mmol/L (ref 3.5–5.1)
Sodium: 138 mmol/L (ref 135–145)

## 2020-02-19 LAB — BRAIN NATRIURETIC PEPTIDE: B Natriuretic Peptide: 637.7 pg/mL — ABNORMAL HIGH (ref 0.0–100.0)

## 2020-02-20 ENCOUNTER — Ambulatory Visit: Payer: Medicare HMO | Admitting: Pharmacist

## 2020-02-20 DIAGNOSIS — J449 Chronic obstructive pulmonary disease, unspecified: Secondary | ICD-10-CM

## 2020-02-20 DIAGNOSIS — E1165 Type 2 diabetes mellitus with hyperglycemia: Secondary | ICD-10-CM

## 2020-02-20 DIAGNOSIS — I739 Peripheral vascular disease, unspecified: Secondary | ICD-10-CM

## 2020-02-20 DIAGNOSIS — I10 Essential (primary) hypertension: Secondary | ICD-10-CM

## 2020-02-20 MED ORDER — TRULICITY 1.5 MG/0.5ML ~~LOC~~ SOAJ: 1.5000 mg | SUBCUTANEOUS | 3 refills | Status: DC

## 2020-02-20 NOTE — Chronic Care Management (AMB) (Signed)
Chronic Care Management   Follow Up Note   02/20/2020 Name: Matthew Miles. MRN: [id number] DOB: Dec 20, 1928  Referred by: [name], MD Reason for referral : Chronic Care Management (Medication Management)   Matthew Bob. is a 86 y.o. year old male who is a primary care patient of [name], [name], MD. The CCM team was consulted for assistance with chronic disease management and care coordination needs.    Contacted patient telephonically for medication management review  Review of patient status, including review of consultants reports, relevant laboratory and other test results, and collaboration with appropriate care team members and the patient's provider was performed as part of comprehensive patient evaluation and provision of chronic care management services.    SDOH (Social Determinants of Health) assessments performed: Yes See Care Plan activities for detailed interventions related to SDOH)  SDOH Interventions     Most Recent Value  SDOH Interventions  Financial Strain Interventions Intervention Not Indicated       Outpatient Encounter Medications as of 02/20/2020  Medication Sig   ACCU-CHEK GUIDE test strip TEST BLOOD SUGAR TWICE DAILY AS DIRECTED   alfuzosin (UROXATRAL) 10 MG 24 hr tablet Take 1 tablet (10 mg total) by mouth daily with breakfast.   aspirin 81 MG tablet Take 81 mg by mouth daily.    carvedilol (COREG) 6.25 MG tablet TAKE 1 TABLET(6.25 MG) BY MOUTH TWICE DAILY   cholecalciferol (VITAMIN D3) 25 MCG (1000 UNIT) tablet Take 1,000 Units by mouth daily.   Cyanocobalamin (B-12) 1000 MCG TBCR Take 1 tablet by mouth daily.    Dulaglutide (TRULICITY) 1.5 WL/7.9GX SOPN Inject 1.5 mg into the skin once a week.   FEROSUL 325 (65 Fe) MG tablet TAKE 1 TABLET TWICE DAILY WITH MEALS (Patient taking differently: Take 325 mg by mouth daily. )   Lancets (ONETOUCH ULTRASOFT) lancets Use as instructed   metFORMIN (GLUCOPHAGE-XR) 500 MG 24 hr  tablet TAKE 2 TABLETS(1000 MG) BY MOUTH TWICE DAILY   pantoprazole (PROTONIX) 20 MG tablet TAKE 1 TABLET EVERY DAY   potassium chloride (KLOR-CON) 10 MEQ tablet TAKE 1 TABLET (10 MEQ) BY MOUTH DAILY   pravastatin (PRAVACHOL) 40 MG tablet TAKE 1 TABLET EVERY DAY   PROAIR HFA 108 (90 Base) MCG/ACT inhaler INHALE 2 PUFFS INTO THE LUNGS EVERY 6 HOURS AS NEEDED FOR WHEEZING OR SHORTNESS OF BREATH   SPIRIVA RESPIMAT 2.5 MCG/ACT AERS INHALE 2 PUFFS INTO THE LUNGS DAILY   torsemide (DEMADEX) 10 MG tablet TAKE 1 TABLET(10 MG) BY MOUTH TWICE DAILY AS NEEDED (Patient taking differently: Take 20 mg by mouth daily. TAKE 1 TABLET(10 MG) BY MOUTH TWICE DAILY AS NEEDED)   clotrimazole (LOTRIMIN) 1 % cream Apply 1 application topically 2 (two) times daily.   nitroGLYCERIN (NITROSTAT) 0.4 MG SL tablet Place 1 tablet (0.4 mg total) under the tongue every 5 (five) minutes as needed. (Patient not taking: Reported on 02/20/2020)   Zinc Oxide 13 % CREA Apply as needed to the wound on your left buttocks   [DISCONTINUED] CALCIUM CITRATE-VITAMIN D PO Take 1 tablet by mouth daily. 1200 mg calcium, vitamin D3   No facility-administered encounter medications on file as of 02/20/2020.     Objective:   Goals Addressed              This Visit's Progress     Patient Stated     PharmD "I want to work on my sugars" (pt-stated)        [place] (  see longtitudinal plan of care for additional care plan information)  Current Barriers:   Social, financial, community barriers: none noted at this time  Diabetes: CONTROLLED, but medication regimen has been changed since; complicated by chronic medical conditions including HTN, HLD, hx prostate cancer, most recent A1c 6.6%  Current antihyperglycemic regimen: metformin XR 9373 mg BID, Trulicity 1.5 mg weekly (two shots of 0.75 mg weekly)  Current glucose readings: Fasting: 118-132; post supper in 200s, though not checking as regularly as fasting    Cardiovascular risk reduction (follows w/ [name]) o Current hypertensive regimen: carvedilol 6.25 mg BID, torsemide 20 mg QAM (2 tablets); home BP today is 125/60, has not checked any other BP readings recently. Has been weighing daily, weights ranging from 169-172 lbs since changing torsemide to 20 mg QAM. Denies lower extremity edema o Current hyperlipidemia regimen: pravastatin 40 mg daily; last LDL at goal <70 o Current antiplatelet regimen: ASA 81 mg daily   Hx prostate cancer: follows w/ [name]; alfuzosin 10 mg daily  COPD: Spiriva 2.5 mcg 2 puffs daily; albuterol PRN; reports breathing well controlled  Supplements: Vitamin B12 1000 mcg daily, ferrous sulfate 325 mg QAM, Vitamin D3 1000 units   Pharmacist Clinical Goal(s):   Over the next 90 days, patient will work with PharmD and primary care provider to address optimized medication management  Interventions:  Comprehensive medication review performed, medication list updated in electronic medical record  Inter-disciplinary care team collaboration (see longitudinal plan of care)  Praised for improvement in glucose readings. Sending updated script for Trulicity 1.5 mg weekly to his pharmacy. Continue metformin 1000 mg BID  Praised for maintenance of Bp/weight. Will update cardiology team.   Encouraged continued home monitoring.  Patient Self Care Activities:   Patient will check blood glucose daily, document, and provide at future appointments  Patient will check BP periodically, document, and provide at future appointments  Patient will take medications as prescribed using a pill box  Patient will report any questions or concerns to provider   Please see past updates related to this goal by clicking on the "Past Updates" button in the selected goal          Plan:  - Scheduled f/u call in ~ 8 weeks  Catie Darnelle Maffucci, PharmD, [place], [place] Pharmacist [facility] [phone]

## 2020-02-20 NOTE — Patient Instructions (Addendum)
Matthew Miles,   It was great talking with you today!  Our goal A1c is less than 7%. This corresponds with fasting sugars less than 130 and 2 hour after meal sugars less than 180. Please check your blood sugar fasting and about 2 hours after the biggest meal of your day  Our goal blood pressure is less than 130/80. Please check your blood pressure a few days a week. Continue to weight yourself daily. Call your cardiologist if you have a gain in weight of 3 lbs in 1 day or 5 lbs over 1 week.   Our goal bad cholesterol, or LDL, is less than 70 . This is why it is important to continue taking your pravastatin  Feel free to call me with any questions or concerns. I look forward to our next visit!  Catie Darnelle Maffucci, PharmD, [place], CPP Direct line: [phone] Clinic phone: [phone]   Visit Information  Goals Addressed              This Visit's Progress     Patient Stated     PharmD "I want to work on my sugars" (pt-stated)        [place] (see longtitudinal plan of care for additional care plan information)  Current Barriers:   Social, financial, community barriers: none noted at this time  Diabetes: CONTROLLED, but medication regimen has been changed since; complicated by chronic medical conditions including HTN, HLD, hx prostate cancer, most recent A1c 6.6%  Current antihyperglycemic regimen: metformin XR 2330 mg BID, Trulicity 1.5 mg weekly (two shots of 0.75 mg weekly)  Current glucose readings: Fasting: 118-132; post supper in 200s, though not checking as regularly as fasting   Cardiovascular risk reduction (follows w/ [name]) o Current hypertensive regimen: carvedilol 6.25 mg BID, torsemide 20 mg QAM (2 tablets); home BP today is 125/60, has not checked any other BP readings recently. Has been weighing daily, weights ranging from 169-172 lbs since changing torsemide to 20 mg QAM. Denies lower extremity edema o Current hyperlipidemia regimen: pravastatin 40 mg  daily; last LDL at goal <70 o Current antiplatelet regimen: ASA 81 mg daily   Hx prostate cancer: follows w/ [name]; alfuzosin 10 mg daily  COPD: Spiriva 2.5 mcg 2 puffs daily; albuterol PRN; reports breathing well controlled  Supplements: Vitamin B12 1000 mcg daily, ferrous sulfate 325 mg QAM, Vitamin D3 1000 units   Pharmacist Clinical Goal(s):   Over the next 90 days, patient will work with PharmD and primary care provider to address optimized medication management  Interventions:  Comprehensive medication review performed, medication list updated in electronic medical record  Inter-disciplinary care team collaboration (see longitudinal plan of care)  Praised for improvement in glucose readings. Sending updated script for Trulicity 1.5 mg weekly to his pharmacy. Continue metformin 1000 mg BID  Praised for maintenance of Bp/weight. Will update cardiology team.   Encouraged continued home monitoring.  Patient Self Care Activities:   Patient will check blood glucose daily, document, and provide at future appointments  Patient will check BP periodically, document, and provide at future appointments  Patient will take medications as prescribed using a pill box  Patient will report any questions or concerns to provider   Please see past updates related to this goal by clicking on the "Past Updates" button in the selected goal         The patient verbalized understanding of instructions provided today and agreed to receive a mailed copy of patient instruction and/or  Scientist, clinical (histocompatibility and immunogenetics).   Plan:  - Scheduled f/u call in ~ 8 weeks  Catie Darnelle Maffucci, PharmD, [place], [place] Pharmacist [place] [phone]

## 2020-02-26 ENCOUNTER — Other Ambulatory Visit: Payer: Self-pay | Admitting: Family Medicine

## 2020-02-26 DIAGNOSIS — E1165 Type 2 diabetes mellitus with hyperglycemia: Secondary | ICD-10-CM

## 2020-03-12 ENCOUNTER — Telehealth: Payer: Medicare HMO

## 2020-03-20 ENCOUNTER — Other Ambulatory Visit: Payer: Self-pay | Admitting: Family Medicine

## 2020-03-20 DIAGNOSIS — E1165 Type 2 diabetes mellitus with hyperglycemia: Secondary | ICD-10-CM

## 2020-04-01 ENCOUNTER — Telehealth: Payer: Self-pay | Admitting: Family Medicine

## 2020-04-01 ENCOUNTER — Other Ambulatory Visit: Payer: Self-pay

## 2020-04-01 ENCOUNTER — Encounter: Payer: Self-pay | Admitting: Family Medicine

## 2020-04-01 ENCOUNTER — Ambulatory Visit (INDEPENDENT_AMBULATORY_CARE_PROVIDER_SITE_OTHER): Payer: Medicare HMO | Admitting: Family Medicine

## 2020-04-01 DIAGNOSIS — E119 Type 2 diabetes mellitus without complications: Secondary | ICD-10-CM | POA: Diagnosis not present

## 2020-04-01 DIAGNOSIS — J449 Chronic obstructive pulmonary disease, unspecified: Secondary | ICD-10-CM | POA: Diagnosis not present

## 2020-04-01 DIAGNOSIS — J432 Centrilobular emphysema: Secondary | ICD-10-CM | POA: Diagnosis not present

## 2020-04-01 DIAGNOSIS — E11319 Type 2 diabetes mellitus with unspecified diabetic retinopathy without macular edema: Secondary | ICD-10-CM | POA: Diagnosis not present

## 2020-04-01 DIAGNOSIS — N401 Enlarged prostate with lower urinary tract symptoms: Secondary | ICD-10-CM | POA: Diagnosis not present

## 2020-04-01 MED ORDER — SPIRIVA RESPIMAT 2.5 MCG/ACT IN AERS: 2.0000 | INHALATION_SPRAY | Freq: Every day | RESPIRATORY_TRACT | 5 refills | Status: DC

## 2020-04-01 NOTE — Assessment & Plan Note (Signed)
Chronic ongoing issue. Notes this is stable. He can continue Uroxatrol.

## 2020-04-01 NOTE — Assessment & Plan Note (Signed)
Patient with some chronic dyspnea on exertion. I encouraged him to use his Spiriva daily. He can continue as needed albuterol.

## 2020-04-01 NOTE — Assessment & Plan Note (Signed)
I encouraged him to follow-up with the ophthalmologist.

## 2020-04-01 NOTE — Assessment & Plan Note (Signed)
Check A1c. Continue Trulicity 1.5 mg once weekly and Metformin XR 1000 mg twice daily.

## 2020-04-01 NOTE — Patient Instructions (Signed)
Nice to see you. Please restart your Spiriva.

## 2020-04-01 NOTE — Progress Notes (Signed)
Matthew Rumps, MD Phone: [phone]  Matthew Miles. is a 86 y.o. male who presents today for f/u.  DIABETES Disease Monitoring: Blood Sugar ranges-124-138 Polyuria/phagia/dipsia- some polyuria       Optho- due soon, patient will call to schedule Medications: Compliance- taking Trulicity, metformin Hypoglycemic symptoms- no  BPH: Patient with chronic issues with urge incontinence and urinary frequency related to BPH. He is on Uroxatrol and does follow with a urologist at [facility]. He does wear depends. Notes he was on a prior medicine that caused mouth soreness and he has since discontinued that.  COPD: Patient notes occasional albuterol use and notes its been about a month since he is use that. He does not use Spiriva. He notes chronic dyspnea on exertion and notes he has to rest at times. No cough or wheezing.  CMA noted that the patient's daughter called in to let us know that he had been having some foot issues and that the patient would likely deny this. I asked the patient about this and he notes he has no pain. He denies any leg or foot issues and notes no limping. He tries to stay active with walking 2-3 times a week for about 2 hours.   Social History   Tobacco Use  Smoking Status Former Smoker  . Packs/day: 1.00  . Years: 65.00  . Pack years: 65.00  . Types: Cigarettes  . Quit date: 04/10/2011  . Years since quitting: 8.9  Smokeless Tobacco Former Systems developer  . Types: Chew     ROS see history of present illness  Objective  Physical Exam Vitals:   04/01/20 1318  BP: 118/60  Pulse: 91  Temp: 98.8 F (37.1 C)  SpO2: 95%    BP Readings from Last 3 Encounters:  04/01/20 118/60  02/07/20 (!) 126/40  01/09/20 (!) 96/59   Wt Readings from Last 3 Encounters:  04/01/20 174 lb (78.9 kg)  02/07/20 176 lb (79.8 kg)  01/09/20 175 lb (79.4 kg)    Physical Exam Constitutional:      General: He is not in acute distress.    Appearance: He is not diaphoretic.   Cardiovascular:     Rate and Rhythm: Normal rate and regular rhythm.     Heart sounds: Normal heart sounds.  Pulmonary:     Effort: Pulmonary effort is normal.     Breath sounds: Normal breath sounds.  Musculoskeletal:     Right lower leg: No edema.     Left lower leg: No edema.  Skin:    General: Skin is warm and dry.  Neurological:     Mental Status: He is alert.      Assessment/Plan: Please see individual problem list.  Problem List Items Addressed This Visit    BPH (benign prostatic hyperplasia)    Chronic ongoing issue. Notes this is stable. He can continue Uroxatrol.      COPD (chronic obstructive pulmonary disease) (HCC) (Chronic)    Patient with some chronic dyspnea on exertion. I encouraged him to use his Spiriva daily. He can continue as needed albuterol.      Relevant Medications   Tiotropium Bromide Monohydrate (SPIRIVA RESPIMAT) 2.5 MCG/ACT AERS   Diabetes (HCC)    Check A1c. Continue Trulicity 1.5 mg once weekly and Metformin XR 1000 mg twice daily.      Diabetic retinopathy ([place])    I encouraged him to follow-up with the ophthalmologist.          This visit occurred during the SARS-CoV-2  public health emergency.  Safety protocols were in place, including screening questions prior to the visit, additional usage of staff PPE, and extensive cleaning of exam room while observing appropriate contact time as indicated for disinfecting solutions.    Matthew Rumps, MD [place]

## 2020-04-06 ENCOUNTER — Telehealth (INDEPENDENT_AMBULATORY_CARE_PROVIDER_SITE_OTHER): Payer: Medicare HMO | Admitting: Family Medicine

## 2020-04-06 ENCOUNTER — Other Ambulatory Visit: Payer: Self-pay

## 2020-04-06 ENCOUNTER — Encounter: Payer: Self-pay | Admitting: Family Medicine

## 2020-04-06 DIAGNOSIS — U071 COVID-19: Secondary | ICD-10-CM | POA: Diagnosis not present

## 2020-04-06 DIAGNOSIS — Z03818 Encounter for observation for suspected exposure to other biological agents ruled out: Secondary | ICD-10-CM | POA: Diagnosis not present

## 2020-04-06 DIAGNOSIS — Z20822 Contact with and (suspected) exposure to covid-19: Secondary | ICD-10-CM | POA: Diagnosis not present

## 2020-04-06 NOTE — Assessment & Plan Note (Signed)
I discussed with the patient given his positive home Covid test that we would treat him as having COVID-19.  Discussed strict quarantine precautions to last at least 10 days from onset of symptoms with at least 24 hours of improving symptoms with no fevers.  Advised his quarantine would last at least through January 4.  We will get him set up for a monoclonal antibody infusion.  Discussed supportive care with Tylenol over-the-counter as well as Robitussin for cough.  We will submit this information to the health department as well.  Advised to seek medical attention in person for worsening breathing, development of chest pain, or fevers that will not reduce.

## 2020-04-06 NOTE — Progress Notes (Signed)
Virtual Visit via telephone Note  This visit type was conducted due to national recommendations for restrictions regarding the COVID-19 pandemic (e.g. social distancing).  This format is felt to be most appropriate for this patient at this time.  All issues noted in this document were discussed and addressed.  No physical exam was performed (except for noted visual exam findings with Video Visits).   I connected with Matthew Miles today at  3:15 PM EST by telephone and verified that I am speaking with the correct person using two identifiers. Location patient: home Location provider: home office Persons participating in the virtual visit: patient, provider, Lovey Newcomer (daughter)  I discussed the limitations, risks, security and privacy concerns of performing an evaluation and management service by telephone and the availability of in person appointments. I also discussed with the patient that there may be a patient responsible charge related to this service. The patient expressed understanding and agreed to proceed.  Interactive audio and video telecommunications were attempted between this provider and patient, however failed, due to patient having technical difficulties OR patient did not have access to video capability.  We continued and completed visit with audio only.   Reason for visit: f/u  HPI: Cough: Patient had onset of symptoms on 04/04/2020.  He woke up hoarse and has had cough.  He notes congestion and rhinorrhea as well as sneezing.  Has some mild dyspnea when he exerts himself though otherwise his breathing is stable.  No fevers.  No taste or smell disturbances.  No known COVID-19 exposures.  He has had his booster vaccine.  His daughter had Covid about a month ago and received the monoclonal antibody infusion though did feel sick after receiving that.  They did a home test that was positive and they got a PCR test earlier today that has not resulted yet.   ROS: See  pertinent positives and negatives per HPI.  Past Medical History:  Diagnosis Date  . Allergy   . Arthritis   . CHF (congestive heart failure) ([place])   . Chicken pox   . Cholecystitis   . Colon polyps   . COPD (chronic obstructive pulmonary disease) ([place])   . Coronary artery disease   . Diabetes mellitus without complication ([place])   . Emphysema of lung ([place])   . GERD (gastroesophageal reflux disease)   . Heart murmur   . Hematemesis/vomiting blood 04/10/11  . Hypercholesterolemia   . Mild hypertension   . Pancreatitis   . Prostate cancer ([place])   . Prostate cancer [facility])    radiation therapy   . Smoker   . Ulcer   . Upper GI bleed 1980    Past Surgical History:  Procedure Laterality Date  . APPENDECTOMY    . CARDIAC CATHETERIZATION  May 2002 and Feb 2013   [facility]; no stents   . CATARACT EXTRACTION    . CHOLECYSTECTOMY    . CIRCUMCISION    . COLONOSCOPY    . HEMORRHOID SURGERY    . SP CHOLECYSTOMY    . STOMACH SURGERY     bleeding ulcers, followed by Dr. [name]    Family History  Problem Relation Age of Onset  . Prostate cancer Father   . Liver cancer Brother   . Prostate cancer Brother   . Emphysema Sister        smoker    SOCIAL HX: Former smoker   Current Outpatient Medications:  .  ACCU-CHEK GUIDE test strip, TEST BLOOD SUGAR TWICE DAILY  AS DIRECTED, Disp: 200 strip, Rfl: 0 .  alfuzosin (UROXATRAL) 10 MG 24 hr tablet, Take 1 tablet (10 mg total) by mouth daily with breakfast., Disp: 90 tablet, Rfl: 0 .  aspirin 81 MG tablet, Take 81 mg by mouth daily. , Disp: , Rfl:  .  carvedilol (COREG) 6.25 MG tablet, TAKE 1 TABLET(6.25 MG) BY MOUTH TWICE DAILY, Disp: 180 tablet, Rfl: 2 .  cholecalciferol (VITAMIN D3) 25 MCG (1000 UNIT) tablet, Take 1,000 Units by mouth daily., Disp: , Rfl:  .  clotrimazole (LOTRIMIN) 1 % cream, Apply 1 application topically 2 (two) times daily., Disp: 30 g, Rfl: 0 .  Cyanocobalamin (B-12) 1000 MCG TBCR, Take 1 tablet by mouth daily. ,  Disp: , Rfl:  .  Dulaglutide (TRULICITY) 1.5 MG/0.5ML SOPN, Inject 1.5 mg into the skin once a week., Disp: 6 mL, Rfl: 3 .  FEROSUL 325 (65 Fe) MG tablet, TAKE 1 TABLET TWICE DAILY WITH MEALS (Patient taking differently: Take 325 mg by mouth daily.), Disp: 180 tablet, Rfl: 1 .  Lancets (ONETOUCH ULTRASOFT) lancets, Use as instructed, Disp: 100 each, Rfl: 12 .  metFORMIN (GLUCOPHAGE-XR) 500 MG 24 hr tablet, TAKE 2 TABLETS(1000 MG) BY MOUTH TWICE DAILY, Disp: 360 tablet, Rfl: 3 .  nitroGLYCERIN (NITROSTAT) 0.4 MG SL tablet, Place 1 tablet (0.4 mg total) under the tongue every 5 (five) minutes as needed., Disp: 25 tablet, Rfl: 0 .  pantoprazole (PROTONIX) 20 MG tablet, TAKE 1 TABLET EVERY DAY, Disp: 90 tablet, Rfl: 1 .  potassium chloride (KLOR-CON) 10 MEQ tablet, TAKE 1 TABLET (10 MEQ) BY MOUTH DAILY, Disp: 90 tablet, Rfl: 1 .  pravastatin (PRAVACHOL) 40 MG tablet, TAKE 1 TABLET EVERY DAY, Disp: 90 tablet, Rfl: 1 .  PROAIR HFA 108 (90 Base) MCG/ACT inhaler, INHALE 2 PUFFS INTO THE LUNGS EVERY 6 HOURS AS NEEDED FOR WHEEZING OR SHORTNESS OF BREATH, Disp: 8.5 g, Rfl: 0 .  Tiotropium Bromide Monohydrate (SPIRIVA RESPIMAT) 2.5 MCG/ACT AERS, Inhale 2 puffs into the lungs daily., Disp: 4 g, Rfl: 5 .  torsemide (DEMADEX) 10 MG tablet, TAKE 1 TABLET(10 MG) BY MOUTH TWICE DAILY AS NEEDED, Disp: 180 tablet, Rfl: 3 .  Zinc Oxide 13 % CREA, Apply as needed to the wound on your left buttocks, Disp: 113 g, Rfl: 1  EXAM:  VITALS per patient if applicable:  GENERAL: alert, oriented, appears well and in no acute distress  HEENT: atraumatic, conjunttiva clear, no obvious abnormalities on inspection of external nose and ears  NECK: normal movements of the head and neck  LUNGS: on inspection no signs of respiratory distress, breathing rate appears normal, no obvious gross SOB, gasping or wheezing  CV: no obvious cyanosis  MS: moves all visible extremities without noticeable abnormality  PSYCH/NEURO: pleasant  and cooperative, no obvious depression or anxiety, speech and thought processing grossly intact  ASSESSMENT AND PLAN:  Discussed the following assessment and plan:  Problem List Items Addressed This Visit    COVID-19    I discussed with the patient given his positive home Covid test that we would treat him as having COVID-19.  Discussed strict quarantine precautions to last at least 10 days from onset of symptoms with at least 24 hours of improving symptoms with no fevers.  Advised his quarantine would last at least through January 4.  We will get him set up for a monoclonal antibody infusion.  Discussed supportive care with Tylenol over-the-counter as well as Robitussin for cough.  We will submit this information  to the health department as well.  Advised to seek medical attention in person for worsening breathing, development of chest pain, or fevers that will not reduce.          I discussed the assessment and treatment plan with the patient. The patient was provided an opportunity to ask questions and all were answered. The patient agreed with the plan and demonstrated an understanding of the instructions.   The patient was advised to call back or seek an in-person evaluation if the symptoms worsen or if the condition fails to improve as anticipated.  I provided 16 minutes of non-face-to-face time during this encounter.   [name], MD

## 2020-04-07 NOTE — Telephone Encounter (Signed)
Pt daughter called and said that he did test positive for COVID yesterday  They are anting to get an infusion scheduled

## 2020-04-07 NOTE — Telephone Encounter (Signed)
I called the patient and received the onset of symptom date and then proceeded to call the antibody infusion clinic and left the information for the patient to have the infusion and I then faxed the information of the positive covid test to the [facility].  Wil Slape,cma

## 2020-04-08 ENCOUNTER — Telehealth: Payer: Self-pay | Admitting: Nurse Practitioner

## 2020-04-08 NOTE — Telephone Encounter (Signed)
Called to discuss with Greggory Stallion. about Covid symptoms and the use of a monoclonal antibody infusion for those with mild to moderate Covid symptoms and at a high risk of hospitalization.     Pt is qualified for this infusion at the [facility] infusion center due to co-morbid conditions however, due to drug shortage, local criteria narrowed and patient does not meet those criteria. Recommend close follow up with PCP regarding symptoms.   No answer when I called. VM left.   Patient Active Problem List   Diagnosis Date Noted  . COVID-19 04/06/2020  . PAD (peripheral artery disease) (HCC) 01/09/2020  . Chronic venous insufficiency 01/09/2020  . Elevated TSH 01/01/2020  . Decreased pedal pulses 01/01/2020  . Lower urinary tract symptoms (LUTS) 06/28/2019  . Dysuria 03/18/2019  . Pressure ulcer 01/16/2019  . Diabetic neuropathy (HCC) 12/31/2018  . Diabetic retinopathy (HCC) 10/07/2018  . Milk intolerance 07/18/2018  . Monoclonal B-cell lymphocytosis 07/18/2018  . Prostate cancer (HCC) 07/04/2018  . BPH (benign prostatic hyperplasia) 02/10/2017  . Left leg pain 10/13/2016  . Falls 08/05/2016  . Chest pain 07/30/2016  . Hip pain, right 02/03/2016  . Actinic keratoses 12/24/2015  . Osteoarthritis of both hands 12/24/2015  . Obesity (BMI 30-39.9) 10/21/2014  . Essential hypertension 07/21/2014  . Murmur, cardiac 10/15/2013  . Chronic low back pain 10/08/2013  . Chronic diastolic CHF (congestive heart failure) (HCC) 04/15/2013  . Thrombocytopenia (HCC) 08/21/2012  . Urge incontinence 07/02/2012  . Hyperlipidemia 06/08/2012  . H/O malignant neoplasm of prostate 06/04/2012  . Diabetes (HCC) 04/26/2012  . Iron deficiency anemia 01/06/2012  . Pulmonary hypertension (HCC) 11/25/2011  . GERD (gastroesophageal reflux disease) 06/13/2011  . Coronary artery disease of native artery of native heart with stable angina pectoris (HCC) 04/26/2011  . Tachycardia 04/26/2011  . COPD (chronic  obstructive pulmonary disease) (HCC)    Consuello Masse, NP [phone] [email or web address]

## 2020-04-16 ENCOUNTER — Telehealth: Payer: Self-pay | Admitting: *Deleted

## 2020-04-16 NOTE — Chronic Care Management (AMB) (Signed)
  Care Management   Note  04/16/2020 Name: Matthew Miles. MRN: [id number] DOB: April 29, 1928  Matthew Miles. is a 87 y.o. year old male who is a primary care patient of Birdie Sons, Yehuda Mao, MD and is actively engaged with the care management team. I reached out to Matthew Miles. by phone today to assist with re-scheduling a follow up visit with the Pharmacist  Follow up plan: Unsuccessful telephone outreach attempt made. A HIPAA compliant phone message was left for the patient providing contact information and requesting a return call. The care management team will reach out to the patient again over the next 7 days. If patient returns call to provider office, please advise to call Embedded Care Management Care Guide Gwenevere Ghazi at [phone]  [facility] Guide, Embedded Care Coordination [facility] Management

## 2020-04-21 NOTE — Chronic Care Management (AMB) (Signed)
  Care Management   Note  04/21/2020 Name: Renn Dirocco. MRN: [id number] DOB: 1929-01-23  [name] is a 87 y.o. year old male who is a primary care patient of [name], [name], MD and is actively engaged with the care management team. I reached out to [name] by phone today to assist with re-scheduling a follow up visit with the Pharmacist.  Follow up plan: Telephone appointment with care management team member scheduled for:05/08/2019  [place] Management

## 2020-04-22 ENCOUNTER — Telehealth: Payer: Medicare HMO

## 2020-04-29 DIAGNOSIS — R634 Abnormal weight loss: Secondary | ICD-10-CM | POA: Diagnosis not present

## 2020-04-29 DIAGNOSIS — Z79899 Other long term (current) drug therapy: Secondary | ICD-10-CM | POA: Diagnosis not present

## 2020-04-29 DIAGNOSIS — C61 Malignant neoplasm of prostate: Secondary | ICD-10-CM | POA: Diagnosis not present

## 2020-04-29 DIAGNOSIS — T50905A Adverse effect of unspecified drugs, medicaments and biological substances, initial encounter: Secondary | ICD-10-CM | POA: Diagnosis not present

## 2020-04-29 DIAGNOSIS — R972 Elevated prostate specific antigen [PSA]: Secondary | ICD-10-CM | POA: Diagnosis not present

## 2020-04-29 DIAGNOSIS — C8307 Small cell B-cell lymphoma, spleen: Secondary | ICD-10-CM | POA: Diagnosis not present

## 2020-04-29 DIAGNOSIS — R399 Unspecified symptoms and signs involving the genitourinary system: Secondary | ICD-10-CM | POA: Diagnosis not present

## 2020-04-29 DIAGNOSIS — T50905S Adverse effect of unspecified drugs, medicaments and biological substances, sequela: Secondary | ICD-10-CM | POA: Diagnosis not present

## 2020-04-29 DIAGNOSIS — D759 Disease of blood and blood-forming organs, unspecified: Secondary | ICD-10-CM | POA: Diagnosis not present

## 2020-04-29 DIAGNOSIS — Z79818 Long term (current) use of other agents affecting estrogen receptors and estrogen levels: Secondary | ICD-10-CM | POA: Diagnosis not present

## 2020-05-01 ENCOUNTER — Other Ambulatory Visit: Payer: Self-pay | Admitting: Family Medicine

## 2020-05-07 ENCOUNTER — Ambulatory Visit: Payer: Medicare HMO | Admitting: Pharmacist

## 2020-05-07 DIAGNOSIS — N401 Enlarged prostate with lower urinary tract symptoms: Secondary | ICD-10-CM

## 2020-05-07 DIAGNOSIS — I739 Peripheral vascular disease, unspecified: Secondary | ICD-10-CM

## 2020-05-07 DIAGNOSIS — E119 Type 2 diabetes mellitus without complications: Secondary | ICD-10-CM

## 2020-05-07 DIAGNOSIS — I25118 Atherosclerotic heart disease of native coronary artery with other forms of angina pectoris: Secondary | ICD-10-CM

## 2020-05-07 DIAGNOSIS — J449 Chronic obstructive pulmonary disease, unspecified: Secondary | ICD-10-CM

## 2020-05-07 NOTE — Chronic Care Management (AMB) (Signed)
Chronic Care Management Pharmacy Note  05/07/2020 Name:  Matthew Miles. MRN:  [id number] DOB:  05/21/1928  Subjective: Matthew Miles. is an 87 y.o. year old male who is a primary patient of Sonnenberg, [name], MD.  The CCM team was consulted for assistance with disease management and care coordination needs.    Engaged with patient by telephone for follow up visit in response to provider referral for pharmacy case management and/or care coordination services.   Consent to Services:  The patient was given information about Chronic Care Management services, agreed to services, and gave verbal consent prior to initiation of services.  Please see initial visit note for detailed documentation.   Objective:  Lab Results  Component Value Date   CREATININE 1.01 02/19/2020   CREATININE 1.27 12/30/2019   CREATININE 1.46 01/16/2019    Lab Results  Component Value Date   HGBA1C 6.8 (H) 12/30/2019       Component Value Date/Time   CHOL 104 03/16/2018 1354   TRIG 118.0 03/16/2018 1354   HDL 28.50 (L) 03/16/2018 1354   CHOLHDL 4 03/16/2018 1354   VLDL 23.6 03/16/2018 1354   LDLCALC 51 03/16/2018 1354   LDLDIRECT 80.0 08/05/2015 1405    BP Readings from Last 3 Encounters:  04/01/20 118/60  02/07/20 (!) 126/40  01/09/20 (!) 96/59    Assessment: Review of patient past medical history, allergies, medications, health status, including review of consultants reports, laboratory and other test data, was performed as part of comprehensive evaluation and provision of chronic care management services.   SDOH:  (Social Determinants of Health) assessments and interventions performed:  SDOH Interventions   Flowsheet Row Most Recent Value  SDOH Interventions   SDOH Interventions for the Following Domains Depression, Stress  Stress Interventions Intervention Not Indicated  Depression Interventions/Treatment  PHQ2-9 Score <4 Follow-up Not Indicated      CCM Care Plan  Allergies   Allergen Reactions  . Sulfa Antibiotics     GI upset  . Tradjenta [Linagliptin] Other (See Comments)    Hair loss    Medications Reviewed Today    Reviewed by De Hollingshead, RPH-CPP (Pharmacist) on 05/07/20 at 27  Med List Status: <None>  Medication Order Taking? Sig Documenting Provider Last Dose Status Informant  ACCU-CHEK GUIDE test strip [id number]  TEST BLOOD SUGAR TWICE DAILY AS DIRECTED [name], MD  Active   alfuzosin (UROXATRAL) 10 MG 24 hr tablet [id number] Yes Take 1 tablet (10 mg total) by mouth daily with breakfast. [name], MD Taking Active   aspirin 81 MG tablet [id number] Yes Take 81 mg by mouth daily.  [provider] Taking Active Self  carvedilol (COREG) 6.25 MG tablet [id number] Yes TAKE 1 TABLET(6.25 MG) BY MOUTH TWICE DAILY [name], MD Taking Active   cholecalciferol (VITAMIN D3) 25 MCG (1000 UNIT) tablet [id number] Yes Take 1,000 Units by mouth daily. [provider] Taking Active   clotrimazole (LOTRIMIN) 1 % cream [id number]  Apply 1 application topically 2 (two) times daily. [name], MD  Active   Cyanocobalamin (B-12) 1000 MCG TBCR [id number] Yes Take 1 tablet by mouth daily.  [provider] Taking Active Self  Dulaglutide (TRULICITY) 1.5 0000000 SOPN [id number] Yes Inject 1.5 mg into the skin once a week. [name], MD Taking Active   FEROSUL 325 [phone] Fe) MG tablet [id number] Yes TAKE 1 TABLET TWICE DAILY WITH MEALS  Patient taking differently: Take 325 mg by mouth daily.  [name], MD Taking Active            Med Note Nat Christen Feb 20, 2020 10:12 AM)    Lancets Glory Rosebush ULTRASOFT) lancets [id number] Yes Use as instructed [name], MD Taking Active   metFORMIN (GLUCOPHAGE-XR) 500 MG 24 hr tablet [id number] Yes TAKE 2 TABLETS(1000 MG) BY MOUTH TWICE DAILY [name], MD Taking Active   nitroGLYCERIN (NITROSTAT) 0.4 MG SL tablet [id number]  Place  1 tablet (0.4 mg total) under the tongue every 5 (five) minutes as needed. [name], MD  Active Self  pantoprazole (PROTONIX) 20 MG tablet [id number] Yes TAKE 1 TABLET EVERY DAY [name], MD Taking Active   potassium chloride (KLOR-CON) 10 MEQ tablet [id number] Yes TAKE 1 TABLET (10 MEQ) BY MOUTH DAILY [name], MD Taking Active   pravastatin (PRAVACHOL) 40 MG tablet [id number] Yes TAKE 1 TABLET EVERY DAY [name], MD Taking Active   PROAIR HFA 108 [phone] Base) MCG/ACT inhaler [id number] Yes INHALE 2 PUFFS INTO THE LUNGS EVERY 6 HOURS AS NEEDED FOR WHEEZING OR SHORTNESS OF BREATH [name], MD Taking Active   Tiotropium Bromide Monohydrate (SPIRIVA RESPIMAT) 2.5 MCG/ACT AERS [id number] Yes Inhale 2 puffs into the lungs daily. [name], MD Taking Active   torsemide (DEMADEX) 10 MG tablet [id number] Yes TAKE 1 TABLET(10 MG) BY MOUTH TWICE DAILY AS NEEDED [name], MD Taking Active   Zinc Oxide 13 % CREA [id number]  Apply as needed to the wound on your left buttocks [name], MD  Active           Patient Active Problem List   Diagnosis Date Noted  . COVID-19 04/06/2020  . PAD (peripheral artery disease) ([place]) 01/09/2020  . Chronic venous insufficiency 01/09/2020  . Elevated TSH 01/01/2020  . Decreased pedal pulses 01/01/2020  . Lower urinary tract symptoms (LUTS) 06/28/2019  . Dysuria 03/18/2019  . Pressure ulcer 01/16/2019  . Diabetic neuropathy ([place]) 12/31/2018  . Diabetic retinopathy ([place]) 10/07/2018  . Milk intolerance 07/18/2018  . Monoclonal B-cell lymphocytosis 07/18/2018  . Prostate cancer ([place]) 07/04/2018  . BPH (benign prostatic hyperplasia) 02/10/2017  . Left leg pain 10/13/2016  . Falls 08/05/2016  . Chest pain 07/30/2016  . Hip pain, right 02/03/2016  . Actinic keratoses 12/24/2015  . Osteoarthritis of both hands 12/24/2015  . Obesity (BMI 30-39.9) 10/21/2014  . Essential hypertension 07/21/2014  .  Murmur, cardiac 10/15/2013  . Chronic low back pain 10/08/2013  . Chronic diastolic CHF (congestive heart failure) ([place]) 04/15/2013  . Thrombocytopenia ([place]) 08/21/2012  . Urge incontinence 07/02/2012  . Hyperlipidemia 06/08/2012  . H/O malignant neoplasm of prostate 06/04/2012  . Diabetes ([place]) 04/26/2012  . Iron deficiency anemia 01/06/2012  . Pulmonary hypertension ([place]) 11/25/2011  . GERD (gastroesophageal reflux disease) 06/13/2011  . Coronary artery disease of native artery of native heart with stable angina pectoris ([place]) 04/26/2011  . Tachycardia 04/26/2011  . COPD (chronic obstructive pulmonary disease) (HCC)     Conditions to be addressed/monitored: HTN, HLD, DMII and hx malignancy  Care Plan : Medication Management  Updates made by De Hollingshead, RPH-CPP since 05/07/2020 12:00 AM    Problem: Diabetes, HTN, Prostate Ca     Goal: Disease Progression Prevention   Start Date: 05/07/2020  This Visit's Progress: On track  Priority: High  Note:   Current Barriers:  . Complex patient with multiple comorbidities increasing risk for hospitalization  Pharmacist Clinical Goal(s):  Marland Kitchen Over the next 90 days, patient will achieve adherence to monitoring guidelines and medication adherence to achieve therapeutic efficacy through collaboration with PharmD and provider.  .   Interventions: . 1:1 collaboration with [name], MD regarding development and update of comprehensive plan of care as evidenced by provider attestation and co-signature . Inter-disciplinary care team collaboration (see longitudinal plan of care) . Comprehensive medication review performed; medication list updated in electronic medical record  Health Maintenance: . Advised by oncology to pursue 4th dose of COVID vaccination prior to initiation of rituximab therapy, though they recommended he defer for ~1-2 weeks (about 1 week from now) to separate from [place] infection in December.  . Extensive  discussion of principals of masking, hand washing when in public  Diabetes: . Controlled; current treatment: metformin 1950 mg BID, Trulicity 1.5 mg weekly o Hx Jardiance - d/t d/t hypotension  . Current glucose readings: fasting glucose: 114-132, post prandial glucose: not checking recently . Denies hypoglycemic symptoms . Current meal patterns: indicates that he doesn't avoid carbohydrates, but is cognizant of eating smaller portion sizes. Confirms that he feels the reduction in appetite with Trulicity.  . 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  . Recommend to continue current regimen at this time  Hypertension/HFpEF . Appropriately managed; current treatment: carvedilol 6.25 mg BID, torsemide 20 mg; follows w/ [name] . Current home readings: reports BP readings 100-110s/50-60s . Denies hypotensive symptoms . Recommended to focus on good hydration given low DBP . Encouraged to continue periodic monitoring of home BP, along with collaboration with cardiology  Hyperlipidemia: . Controlled; current treatment: pravastatin 40 mg daily   . Antiplatelet regimen: aspirin 81 mg daily . Recommended to continue current regimen at this time  Chronic Obstructive Pulmonary Disease: . Controlled; current treatment: Spiriva Respimat 2.5 mcg 2 puffs daily; albuterol HFA PRN -reports little need for albuterol since recovery from COVID . GOLD Classification: B . Recommended to  continue current regimen at this time  Hx Prostate Cancer: . PSA increased on last check. F/u with [facility] in ~ 3 months, consideration of bone scan. Still holding Lupron therapy.  . Advised continuation of calcium + vitamin D supplementation . Current treatment: alfuzosin 10 mg daily . Continue current regimen along with collaboration with urology  Splenic Marginal Zone B-Cell Lymphoma: . Follows with [facility]. Plan to begin rituximab therapy in February.  . Reviewed recommendations to  pursue 4th COVID vaccination prior to beginning therapy  Patient Goals/Self-Care Activities . Over the next 90 days, patient will:  - take medications as prescribed check glucose daily, document, and provide at future appointments check blood pressure periodically, document, and provide at future appointments  Follow Up Plan: Telephone follow up appointment with care management team member scheduled for: ~ 10 weeks      Medication Assistance: None required.  Patient affirms current coverage meets needs.  Follow Up:  Patient agrees to Care Plan and Follow-up.  Plan: Telephone follow up appointment with care management team member scheduled for:  ~ 10 weeks  Catie Darnelle Maffucci, PharmD, [place], [place] Clinical Pharmacist [employer] [facility] [employer] [phone]

## 2020-05-07 NOTE — Patient Instructions (Signed)
Visit Information  Goals Addressed              This Visit's Progress     Patient Stated   .  Medication Monitoring (pt-stated)        Patient Goals/Self-Care Activities . Over the next 90 days, patient will:  - take medications as prescribed check glucose daily, document, and provide at future appointments check blood pressure periodically, document, and provide at future appointments        The patient verbalized understanding of instructions, educational materials, and care plan provided today and declined offer to receive copy of patient instructions, educational materials, and care plan.   Plan: Telephone follow up appointment with care management team member scheduled for:  ~ 10 weeks  Catie Darnelle Maffucci, PharmD, [place], [place] Clinical Pharmacist [employer] [facility] [employer] [phone]

## 2020-05-11 NOTE — Progress Notes (Unsigned)
cardiology Office Note  Date:  05/12/2020   ID:  Matthew Miles., DOB 05-Jun-1928, MRN [id number]  PCP:  [name], MD   Chief Complaint  Patient presents with  . Other    12 month f/u no complaints today. Meds reviewed verbally with pt.    HPI:  Matthew Miles is a 87 yo-old gentleman with history of  DM II, HBA1C 7.5 coronary artery disease (occluded LAD),  moderate pulmonary hypertension in early 2013,  long history of smoking for 60 years, stopped more than 6 years ago COPD bronchitis requiring several courses of antibiotics at the end of 2012,  December for hematemesis and gastric ulcer seen on EGD,  nonsustained VT per the notes,  acute renal failure  Ejection fraction 55% in 2015 Prostate cancer, XRT who presents for followup Of his coronary artery disease  Dec 25 , 2021: covid + Reports he has recovered He was vaccinated  Followed at [facility], oncology, records reviewed lymphadenopathy and splenomegaly and cytopenias (anemia/thrombocytopenia).  most consistent with sMZL.  plan for weekly rituximab x 4 for treatment   On inhaler, uses once a day No oxygen No exercise Goes shopping  No angina/chest pain Rare GERD  chronic mild leg edema, nonpitting  Taking torsemide 20 mg daily Uses depends   Lab Results  Component Value Date   CHOL 104 03/16/2018   HDL 28.50 (L) 03/16/2018   LDLCALC 51 03/16/2018   TRIG 118.0 03/16/2018    EKG personally reviewed by myself on todays visit Shows normal sinus rhythm rate 81 bpm left anterior fascicular block, old anterior MI  Other past medical history reviewed Chronic low back pain Reports that he had a bleeding ulcer 4 years ago at that time stop smoking Last echocardiogram in 2015 showing normal ejection fraction, right heart pressures 37 mmHg  left and right heart cath done in February 2013 showing pulmonary hypertension, stable severe coronary artery disease  Normal ejection fraction  Right heart  catheterization showed significant fluid overload with wedge pressure of 24, moderate pulmonary hypertension. Catheterization showed occluded LAD which was old, no other significant stenoses requiring intervention.  PMH:   has a past medical history of Allergy, Arthritis, CHF (congestive heart failure) ([place]), Chicken pox, Cholecystitis, Colon polyps, COPD (chronic obstructive pulmonary disease) ([place]), Coronary artery disease, Diabetes mellitus without complication ([place]), Emphysema of lung ([place]), GERD (gastroesophageal reflux disease), Heart murmur, Hematemesis/vomiting blood (04/10/11), Hypercholesterolemia, Mild hypertension, Pancreatitis, Prostate cancer ([place]), Prostate cancer ([place]), Smoker, Ulcer, and Upper GI bleed (1980).  PSH:    Past Surgical History:  Procedure Laterality Date  . APPENDECTOMY    . CARDIAC CATHETERIZATION  May 2002 and Feb 2013   [facility]; no stents   . CATARACT EXTRACTION    . CHOLECYSTECTOMY    . CIRCUMCISION    . COLONOSCOPY    . HEMORRHOID SURGERY    . SP CHOLECYSTOMY    . STOMACH SURGERY     bleeding ulcers, followed by Dr. [name]    Current Outpatient Medications  Medication Sig Dispense Refill  . ACCU-CHEK GUIDE test strip TEST BLOOD SUGAR TWICE DAILY AS DIRECTED 200 strip 0  . alfuzosin (UROXATRAL) 10 MG 24 hr tablet Take 1 tablet (10 mg total) by mouth daily with breakfast. 90 tablet 0  . aspirin 81 MG tablet Take 81 mg by mouth daily.     Marland Kitchen CALCIUM PO Take by mouth daily.    . carvedilol (COREG) 6.25 MG tablet TAKE 1 TABLET(6.25 MG) BY MOUTH TWICE  DAILY 180 tablet 2  . cholecalciferol (VITAMIN D3) 25 MCG (1000 UNIT) tablet Take 1,000 Units by mouth daily.    . clotrimazole (LOTRIMIN) 1 % cream Apply 1 application topically 2 (two) times daily. 30 g 0  . Cyanocobalamin (B-12) 1000 MCG TBCR Take 1 tablet by mouth daily.     . Dulaglutide (TRULICITY) 1.5 BZ/1.6RC SOPN Inject 1.5 mg into the skin once a week. 6 mL 3  . FEROSUL 325 (65 Fe) MG tablet TAKE 1  TABLET TWICE DAILY WITH MEALS (Patient taking differently: Take 325 mg by mouth daily.) 180 tablet 1  . Lancets (ONETOUCH ULTRASOFT) lancets Use as instructed 100 each 12  . metFORMIN (GLUCOPHAGE-XR) 500 MG 24 hr tablet TAKE 2 TABLETS(1000 MG) BY MOUTH TWICE DAILY 360 tablet 3  . nitroGLYCERIN (NITROSTAT) 0.4 MG SL tablet Place 1 tablet (0.4 mg total) under the tongue every 5 (five) minutes as needed. 25 tablet 0  . pantoprazole (PROTONIX) 20 MG tablet TAKE 1 TABLET EVERY DAY 90 tablet 1  . potassium chloride (KLOR-CON) 10 MEQ tablet TAKE 1 TABLET (10 MEQ) BY MOUTH DAILY 90 tablet 1  . pravastatin (PRAVACHOL) 40 MG tablet TAKE 1 TABLET EVERY DAY 90 tablet 1  . PROAIR HFA 108 (90 Base) MCG/ACT inhaler INHALE 2 PUFFS INTO THE LUNGS EVERY 6 HOURS AS NEEDED FOR WHEEZING OR SHORTNESS OF BREATH 8.5 g 0  . Tiotropium Bromide Monohydrate (SPIRIVA RESPIMAT) 2.5 MCG/ACT AERS Inhale 2 puffs into the lungs daily. 4 g 5  . torsemide (DEMADEX) 10 MG tablet TAKE 1 TABLET(10 MG) BY MOUTH TWICE DAILY AS NEEDED 180 tablet 3  . Zinc Oxide 13 % CREA Apply as needed to the wound on your left buttocks 113 g 1   No current facility-administered medications for this visit.     Allergies:   Oxybutynin, Sulfa antibiotics, and Tradjenta [linagliptin]   Social History:  The patient  reports that he quit smoking about 9 years ago. His smoking use included cigarettes. He has a 65.00 pack-year smoking history. He has quit using smokeless tobacco.  His smokeless tobacco use included chew. He reports current alcohol use. He reports that he does not use drugs.   Family History:   family history includes Emphysema in his sister; Liver cancer in his brother; Prostate cancer in his brother and father.   Review of Systems: Review of Systems  Constitutional: Negative.   Respiratory: Negative.   Gastrointestinal: Negative.   Musculoskeletal: Positive for falls and joint pain.  Neurological: Negative.   Psychiatric/Behavioral:  Negative.   All other systems reviewed and are negative.  PHYSICAL EXAM: VS:  BP (!) 122/50 (BP Location: Left Arm, Patient Position: Sitting, Cuff Size: Normal)   Pulse 81   Ht 5\' 3"  (1.6 m)   Wt 170 lb 4 oz (77.2 kg)   SpO2 96%   BMI 30.16 kg/m  , BMI Body mass index is 30.16 kg/m. Constitutional:  oriented to person, place, and time. No distress.  HENT:  Head: Grossly normal Eyes:  no discharge. No scleral icterus.  Neck: No JVD, no carotid bruits  Cardiovascular: Regular rate and rhythm, no murmurs appreciated Pulmonary/Chest: Clear to auscultation bilaterally, no wheezes or rails Abdominal: Soft.  no distension.  no tenderness.  Musculoskeletal: Normal range of motion Neurological:  normal muscle tone. Coordination normal. No atrophy Skin: Skin warm and dry Psychiatric: normal affect, pleasant  Recent Labs: 12/30/2019: ALT 12; Hemoglobin 11.2; Platelets 83.0; TSH 4.92 02/19/2020: B Natriuretic Peptide 637.7; BUN  20; Creatinine, Ser 1.01; Potassium 4.2; Sodium 138   Lipid Panel Lab Results  Component Value Date   CHOL 104 03/16/2018   HDL 28.50 (L) 03/16/2018   LDLCALC 51 03/16/2018   TRIG 118.0 03/16/2018    Wt Readings from Last 3 Encounters:  05/12/20 170 lb 4 oz (77.2 kg)  04/06/20 174 lb (78.9 kg)  04/01/20 174 lb (78.9 kg)     ASSESSMENT AND PLAN:  Pulmonary hypertension ([place]) -  Last echo 10/2013, ejection fraction greater than 55% on torsemide 20 mg daily euvolemic  Chronic diastolic CHF (congestive heart failure) ([place]) - Stay on torsemide 20 daily Potassium stable  Essential hypertension - Plan: EKG 12-Lead Blood pressure is well controlled on today's visit. No changes made to the medications.  Coronary artery disease involving native coronary artery of native heart without angina pectoris - Plan: EKG 12-Lead Currently with no symptoms of angina. No further workup at this time. Continue current medication regimen.  Mixed hyperlipidemia - Plan:  EKG 12-Lead Cholesterol is at goal on the current lipid regimen. No changes to the medications were made.  Uncontrolled type 2 diabetes mellitus with other circulatory complication, without long-term current use of insulin ([place]) - We have encouraged continued exercise, careful diet management in an effort to lose weight.  Chronic obstructive pulmonary disease, unspecified COPD type ([place])  On inhaler daily Stable  [facility] records reviewed Discussed recent Covid infection with him, risk and benefit of vaccine booster  total encounter time more than 35 minutes  Greater than 50% was spent in counseling and coordination of care with the patient    No orders of the defined types were placed in this encounter.    Signed, Esmond Plants, M.D., Ph.D. 05/12/2020  [place], [place]

## 2020-05-12 ENCOUNTER — Other Ambulatory Visit: Payer: Self-pay

## 2020-05-12 ENCOUNTER — Ambulatory Visit: Payer: Medicare HMO | Admitting: Cardiovascular Disease

## 2020-05-12 ENCOUNTER — Encounter: Payer: Self-pay | Admitting: Cardiovascular Disease

## 2020-05-12 VITALS — BP 122/50 | HR 81 | Ht 63.0 in | Wt 170.2 lb

## 2020-05-12 DIAGNOSIS — J432 Centrilobular emphysema: Secondary | ICD-10-CM | POA: Diagnosis not present

## 2020-05-12 DIAGNOSIS — E1165 Type 2 diabetes mellitus with hyperglycemia: Secondary | ICD-10-CM | POA: Diagnosis not present

## 2020-05-12 DIAGNOSIS — E782 Mixed hyperlipidemia: Secondary | ICD-10-CM

## 2020-05-12 DIAGNOSIS — I25118 Atherosclerotic heart disease of native coronary artery with other forms of angina pectoris: Secondary | ICD-10-CM

## 2020-05-12 DIAGNOSIS — I10 Essential (primary) hypertension: Secondary | ICD-10-CM

## 2020-05-12 DIAGNOSIS — E1059 Type 1 diabetes mellitus with other circulatory complications: Secondary | ICD-10-CM | POA: Diagnosis not present

## 2020-05-12 DIAGNOSIS — J449 Chronic obstructive pulmonary disease, unspecified: Secondary | ICD-10-CM | POA: Diagnosis not present

## 2020-05-12 DIAGNOSIS — I5032 Chronic diastolic (congestive) heart failure: Secondary | ICD-10-CM

## 2020-05-12 DIAGNOSIS — I272 Pulmonary hypertension, unspecified: Secondary | ICD-10-CM | POA: Diagnosis not present

## 2020-05-12 NOTE — Patient Instructions (Signed)
Medication Instructions:  No changes  If you need a refill on your cardiac medications before your next appointment, please call your pharmacy.    Lab work: No new labs needed   If you have labs (blood work) drawn today and your tests are completely normal, you will receive your results only by: . MyChart Message (if you have MyChart) OR . A paper copy in the mail If you have any lab test that is abnormal or we need to change your treatment, we will call you to review the results.   Testing/Procedures: No new testing needed   Follow-Up: At [facility], you and your health needs are our priority.  As part of our continuing mission to provide you with exceptional heart care, we have created designated Provider Care Teams.  These Care Teams include your primary Cardiologist (physician) and Advanced Practice Providers (APPs -  Physician Assistants and Nurse Practitioners) who all work together to provide you with the care you need, when you need it.  . You will need a follow up appointment in 6 months  . Providers on your designated Care Team:   . Christopher Berge, NP . Ryan Dunn, PA-C . Jacquelyn Visser, PA-C  Any Other Special Instructions Will Be Listed Below (If Applicable).  COVID-19 Vaccine Information can be found at: [email or web address] For questions related to vaccine distribution or appointments, please email [email or web address] or call [phone].     

## 2020-05-20 ENCOUNTER — Other Ambulatory Visit: Payer: Self-pay | Admitting: Family Medicine

## 2020-05-22 ENCOUNTER — Telehealth: Payer: Self-pay | Admitting: Family Medicine

## 2020-05-22 NOTE — Telephone Encounter (Signed)
Patient's daughter wanted to if there was something or someone could with depends he use a lot of this very expensive

## 2020-05-25 NOTE — Telephone Encounter (Signed)
Is this something we can assist with?

## 2020-05-26 NOTE — Telephone Encounter (Signed)
Called patient and left message regarding NHA awaiting feedback as to whether or not assistance can be provided as requested. As for now no assistance offered, however, should this change nurse will reach out and notify.

## 2020-05-29 ENCOUNTER — Emergency Department: Payer: Medicare HMO

## 2020-05-29 ENCOUNTER — Emergency Department
Admission: EM | Admit: 2020-05-29 | Discharge: 2020-05-29 | Disposition: A | Discharge status: Home / Self Care | Payer: Medicare HMO | Attending: Emergency Medicine | Admitting: Emergency Medicine

## 2020-05-29 ENCOUNTER — Telehealth: Payer: Self-pay | Admitting: Family Medicine

## 2020-05-29 ENCOUNTER — Other Ambulatory Visit: Payer: Self-pay

## 2020-05-29 ENCOUNTER — Encounter: Payer: Self-pay | Admitting: Emergency Medicine

## 2020-05-29 ENCOUNTER — Telehealth: Payer: Self-pay | Admitting: Cardiovascular Disease

## 2020-05-29 DIAGNOSIS — I10 Essential (primary) hypertension: Secondary | ICD-10-CM | POA: Diagnosis not present

## 2020-05-29 DIAGNOSIS — Z7984 Long term (current) use of oral hypoglycemic drugs: Secondary | ICD-10-CM | POA: Insufficient documentation

## 2020-05-29 DIAGNOSIS — I2510 Atherosclerotic heart disease of native coronary artery without angina pectoris: Secondary | ICD-10-CM | POA: Diagnosis not present

## 2020-05-29 DIAGNOSIS — Z79899 Other long term (current) drug therapy: Secondary | ICD-10-CM | POA: Insufficient documentation

## 2020-05-29 DIAGNOSIS — K5730 Diverticulosis of large intestine without perforation or abscess without bleeding: Secondary | ICD-10-CM | POA: Diagnosis not present

## 2020-05-29 DIAGNOSIS — E11319 Type 2 diabetes mellitus with unspecified diabetic retinopathy without macular edema: Secondary | ICD-10-CM | POA: Diagnosis not present

## 2020-05-29 DIAGNOSIS — Z8616 Personal history of COVID-19: Secondary | ICD-10-CM | POA: Diagnosis not present

## 2020-05-29 DIAGNOSIS — Z8546 Personal history of malignant neoplasm of prostate: Secondary | ICD-10-CM | POA: Insufficient documentation

## 2020-05-29 DIAGNOSIS — R519 Headache, unspecified: Secondary | ICD-10-CM | POA: Insufficient documentation

## 2020-05-29 DIAGNOSIS — R140 Abdominal distension (gaseous): Secondary | ICD-10-CM | POA: Diagnosis not present

## 2020-05-29 DIAGNOSIS — I110 Hypertensive heart disease with heart failure: Secondary | ICD-10-CM | POA: Insufficient documentation

## 2020-05-29 DIAGNOSIS — Z7982 Long term (current) use of aspirin: Secondary | ICD-10-CM | POA: Diagnosis not present

## 2020-05-29 DIAGNOSIS — R161 Splenomegaly, not elsewhere classified: Secondary | ICD-10-CM | POA: Diagnosis not present

## 2020-05-29 DIAGNOSIS — J449 Chronic obstructive pulmonary disease, unspecified: Secondary | ICD-10-CM | POA: Diagnosis not present

## 2020-05-29 DIAGNOSIS — I5032 Chronic diastolic (congestive) heart failure: Secondary | ICD-10-CM | POA: Insufficient documentation

## 2020-05-29 DIAGNOSIS — K2100 Gastro-esophageal reflux disease with esophagitis, without bleeding: Secondary | ICD-10-CM | POA: Diagnosis not present

## 2020-05-29 DIAGNOSIS — K7460 Unspecified cirrhosis of liver: Secondary | ICD-10-CM | POA: Diagnosis not present

## 2020-05-29 DIAGNOSIS — Z87891 Personal history of nicotine dependence: Secondary | ICD-10-CM | POA: Diagnosis not present

## 2020-05-29 DIAGNOSIS — R079 Chest pain, unspecified: Secondary | ICD-10-CM | POA: Diagnosis not present

## 2020-05-29 DIAGNOSIS — K766 Portal hypertension: Secondary | ICD-10-CM | POA: Diagnosis not present

## 2020-05-29 DIAGNOSIS — E1140 Type 2 diabetes mellitus with diabetic neuropathy, unspecified: Secondary | ICD-10-CM | POA: Insufficient documentation

## 2020-05-29 LAB — HEPATIC FUNCTION PANEL
ALT: 14 U/L (ref 0–44)
AST: 19 U/L (ref 15–41)
Albumin: 3.9 g/dL (ref 3.5–5.0)
Alkaline Phosphatase: 73 U/L (ref 38–126)
Bilirubin, Direct: 0.1 mg/dL (ref 0.0–0.2)
Indirect Bilirubin: 0.6 mg/dL (ref 0.3–0.9)
Total Bilirubin: 0.7 mg/dL (ref 0.3–1.2)
Total Protein: 6.8 g/dL (ref 6.5–8.1)

## 2020-05-29 LAB — CBC
HCT: 28.3 % — ABNORMAL LOW (ref 39.0–52.0)
Hemoglobin: 9 g/dL — ABNORMAL LOW (ref 13.0–17.0)
MCH: 28.8 pg (ref 26.0–34.0)
MCHC: 31.8 g/dL (ref 30.0–36.0)
MCV: 90.7 fL (ref 80.0–100.0)
Platelets: 74 10*3/uL — ABNORMAL LOW (ref 150–400)
RBC: 3.12 MIL/uL — ABNORMAL LOW (ref 4.22–5.81)
RDW: 15.5 % (ref 11.5–15.5)
WBC: 3.8 10*3/uL — ABNORMAL LOW (ref 4.0–10.5)
nRBC: 0 % (ref 0.0–0.2)

## 2020-05-29 LAB — TROPONIN I (HIGH SENSITIVITY)
Troponin I (High Sensitivity): 7 ng/L (ref <18)
Troponin I (High Sensitivity): 8 ng/L (ref <18)

## 2020-05-29 LAB — BASIC METABOLIC PANEL
Anion gap: 8 (ref 5–15)
BUN: 26 mg/dL — ABNORMAL HIGH (ref 8–23)
CO2: 23 mmol/L (ref 22–32)
Calcium: 8.7 mg/dL — ABNORMAL LOW (ref 8.9–10.3)
Chloride: 106 mmol/L (ref 98–111)
Creatinine, Ser: 1.37 mg/dL — ABNORMAL HIGH (ref 0.61–1.24)
GFR, Estimated: 49 mL/min — ABNORMAL LOW (ref >60)
Glucose, Bld: 112 mg/dL — ABNORMAL HIGH (ref 70–99)
Potassium: 5 mmol/L (ref 3.5–5.1)
Sodium: 137 mmol/L (ref 135–145)

## 2020-05-29 LAB — LIPASE, BLOOD: Lipase: 28 U/L (ref 11–51)

## 2020-05-29 IMAGING — CT CT HEAD W/O CM
3 series · 16 of 47 positions shown, 19 images · non-contrast
Comparison: [DATE]

CLINICAL DATA: Headache

EXAM:
CT HEAD WITHOUT CONTRAST
TECHNIQUE: Contiguous axial images were obtained from the base of the skull
through the vertex without intravenous contrast.

[Series 2: head wo · axial · 0.45mm/px · z∈[+388,+513]mm · 10 of 30 slices shown, 13 images]
[im 3/30  brain]
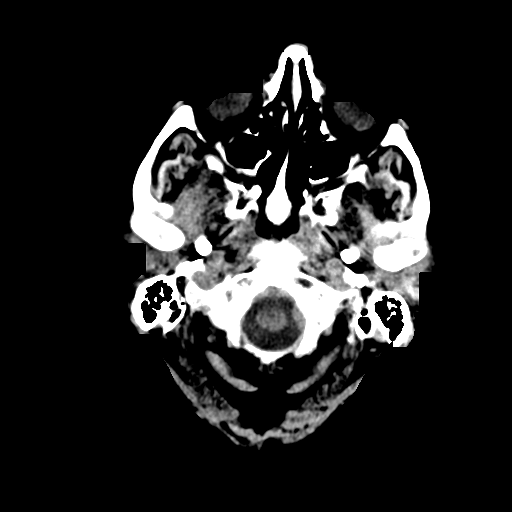
[im 3/30  bone]
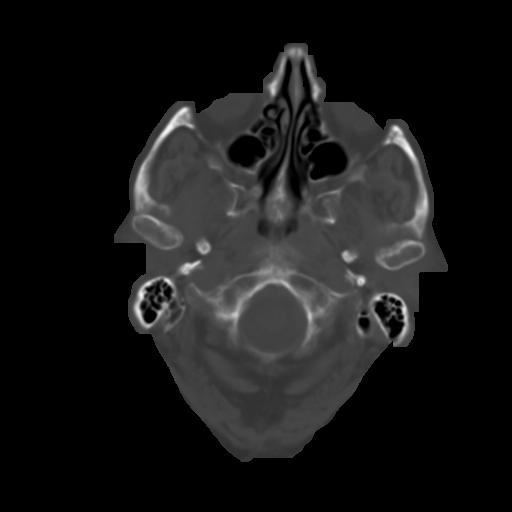
[im 6/30  brain]
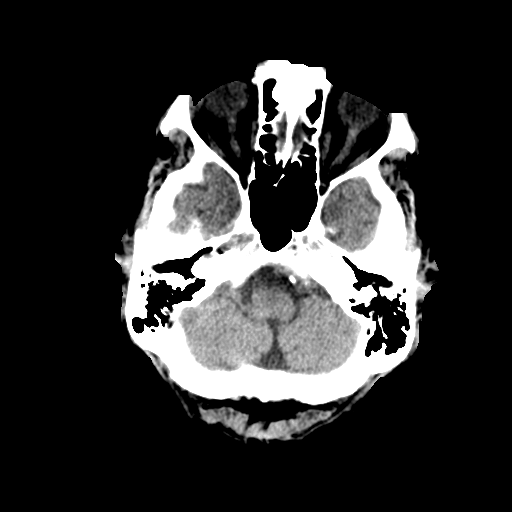
[im 9/30  brain]
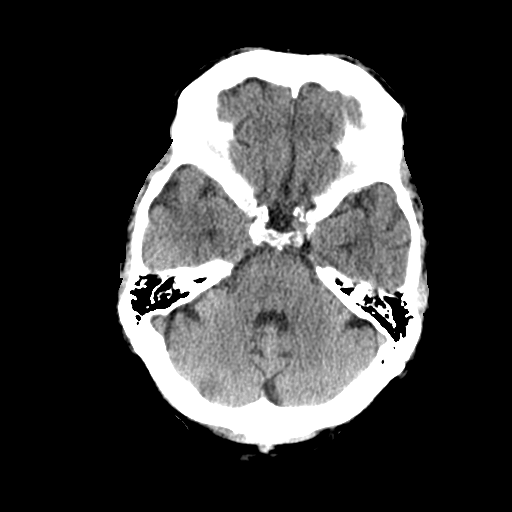
[im 11/30  brain]
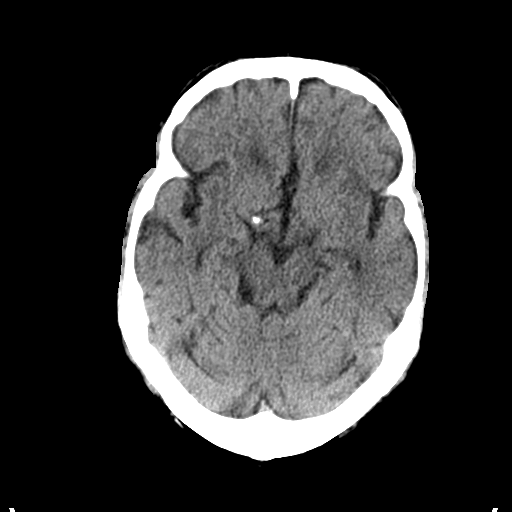
[im 14/30  brain]
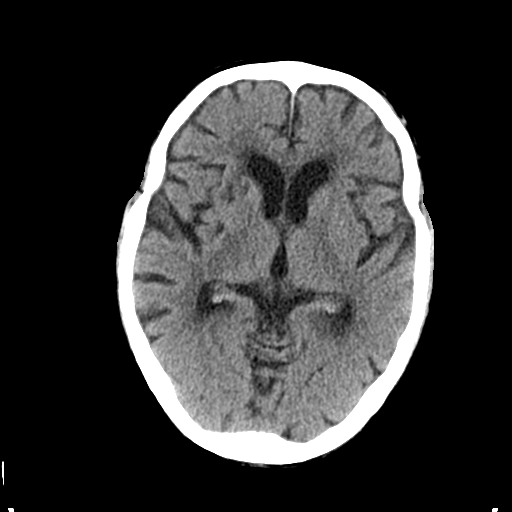
[im 14/30  bone]
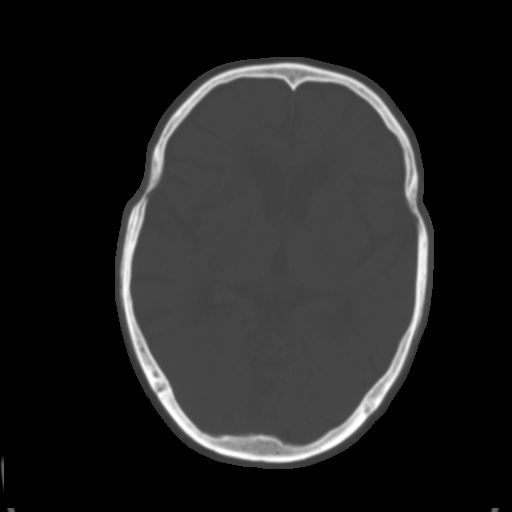
[im 17/30  brain]
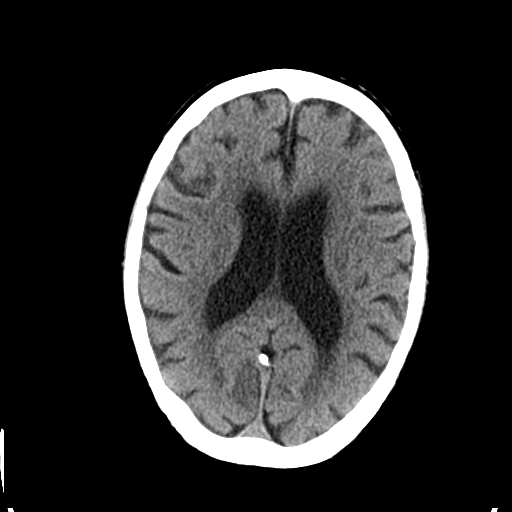
[im 20/30  brain]
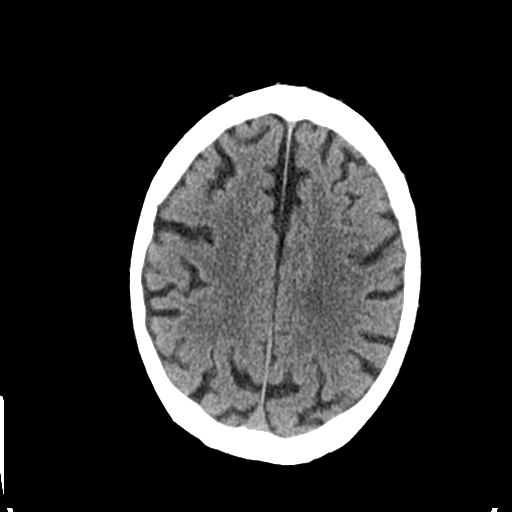
[im 23/30  brain]
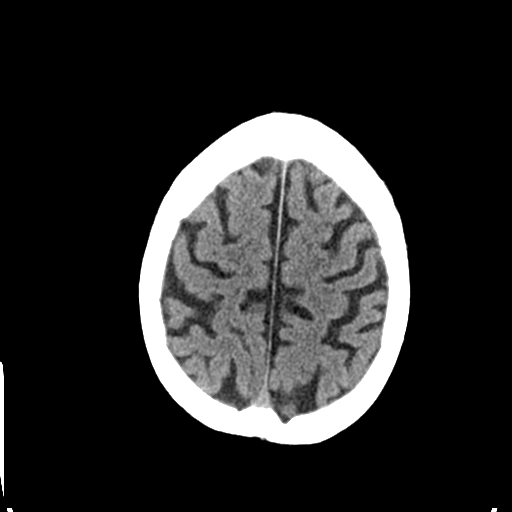
[im 25/30  brain]
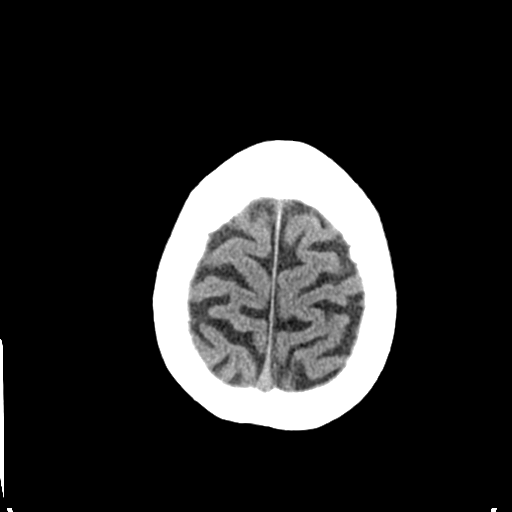
[im 25/30  bone]
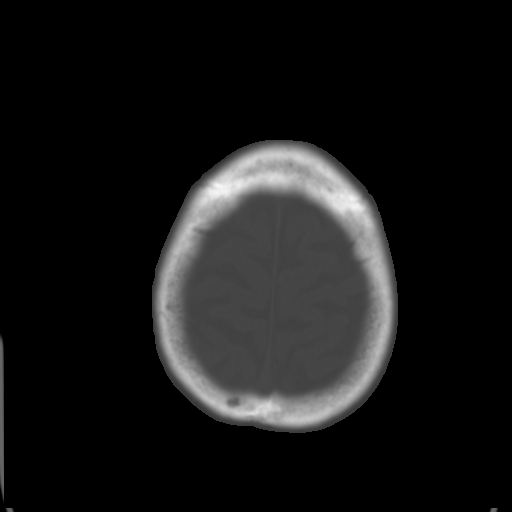
[im 28/30  brain]
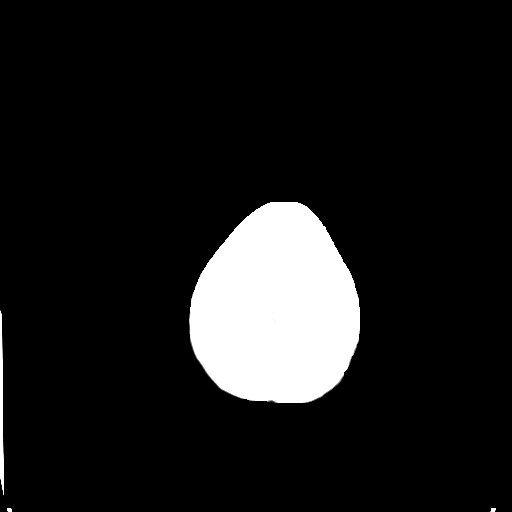

[Series 4: coronal soft tissue · coronal · 0.31mm/px · 3 of 73 slices shown]
[im 25/73  brain]
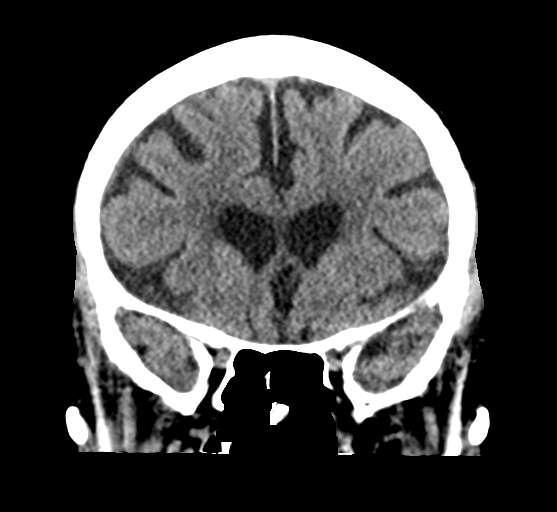
[im 33/73  brain]
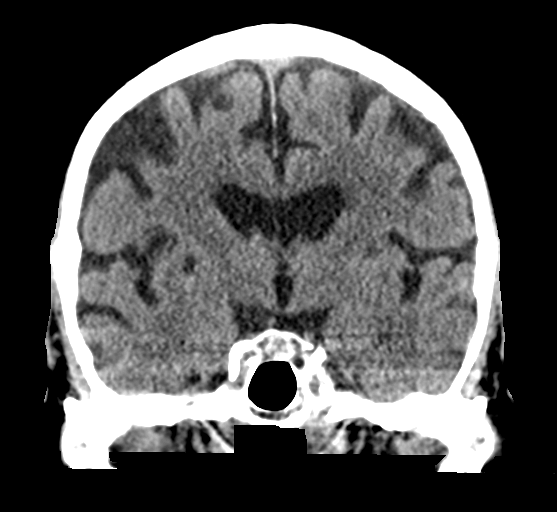
[im 41/73  brain]
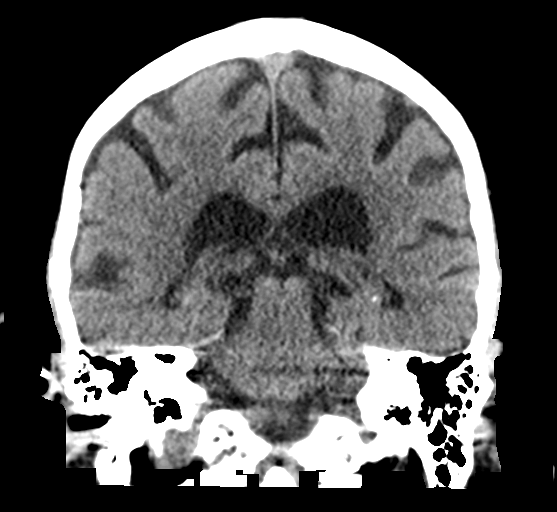

[Series 5: sagittal soft tissue · sagittal · 0.36mm/px · 3 of 59 slices shown]
[im 20/59  brain]
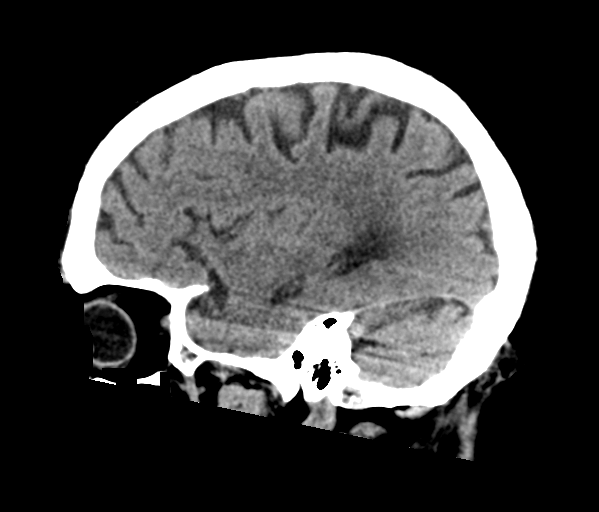
[im 30/59  brain]
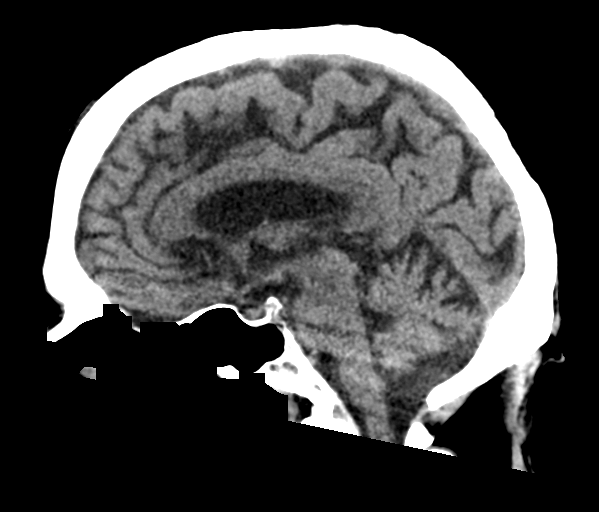
[im 39/59  brain]
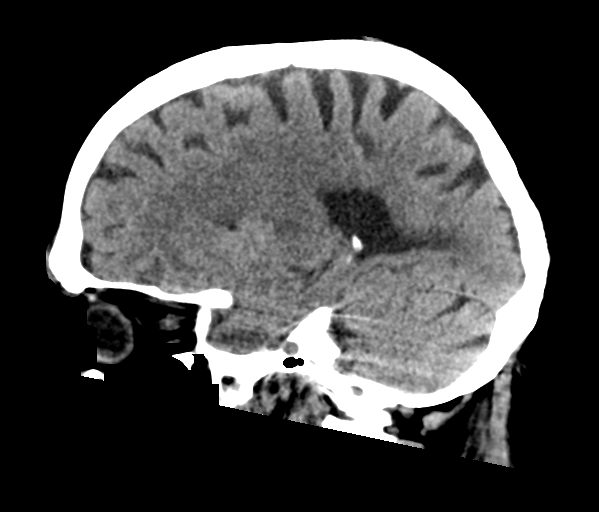

[16 of 47 positions shown; findings below may reference images not displayed]

FINDINGS: Brain: No acute infarct or hemorrhage. Chronic small vessel ischemic
changes are seen within the right basal ganglia and periventricular
white matter. Lateral ventricles and midline structures are
unremarkable. No acute extra-axial fluid collections. No mass
effect.

Vascular: Extensive atherosclerosis of the internal carotid arteries
unchanged. No hyperdense vessel.

Skull: Normal. Negative for fracture or focal lesion.

Sinuses/Orbits: No acute finding.

Other: None.
IMPRESSION: 1. Chronic small vessel ischemic changes as above. No acute
intracranial process.

## 2020-05-29 IMAGING — CR DG CHEST 2V
1 series · 2 of 2 positions shown · non-contrast
Comparison: [DATE]

CLINICAL DATA: Chest pain, indigestion chest pain, indigestion

EXAM:
CHEST - 2 VIEW

[Series 1: dg chest 2 view · 0.14mm/px · 2 of 2 slices shown]
[im 1/2]
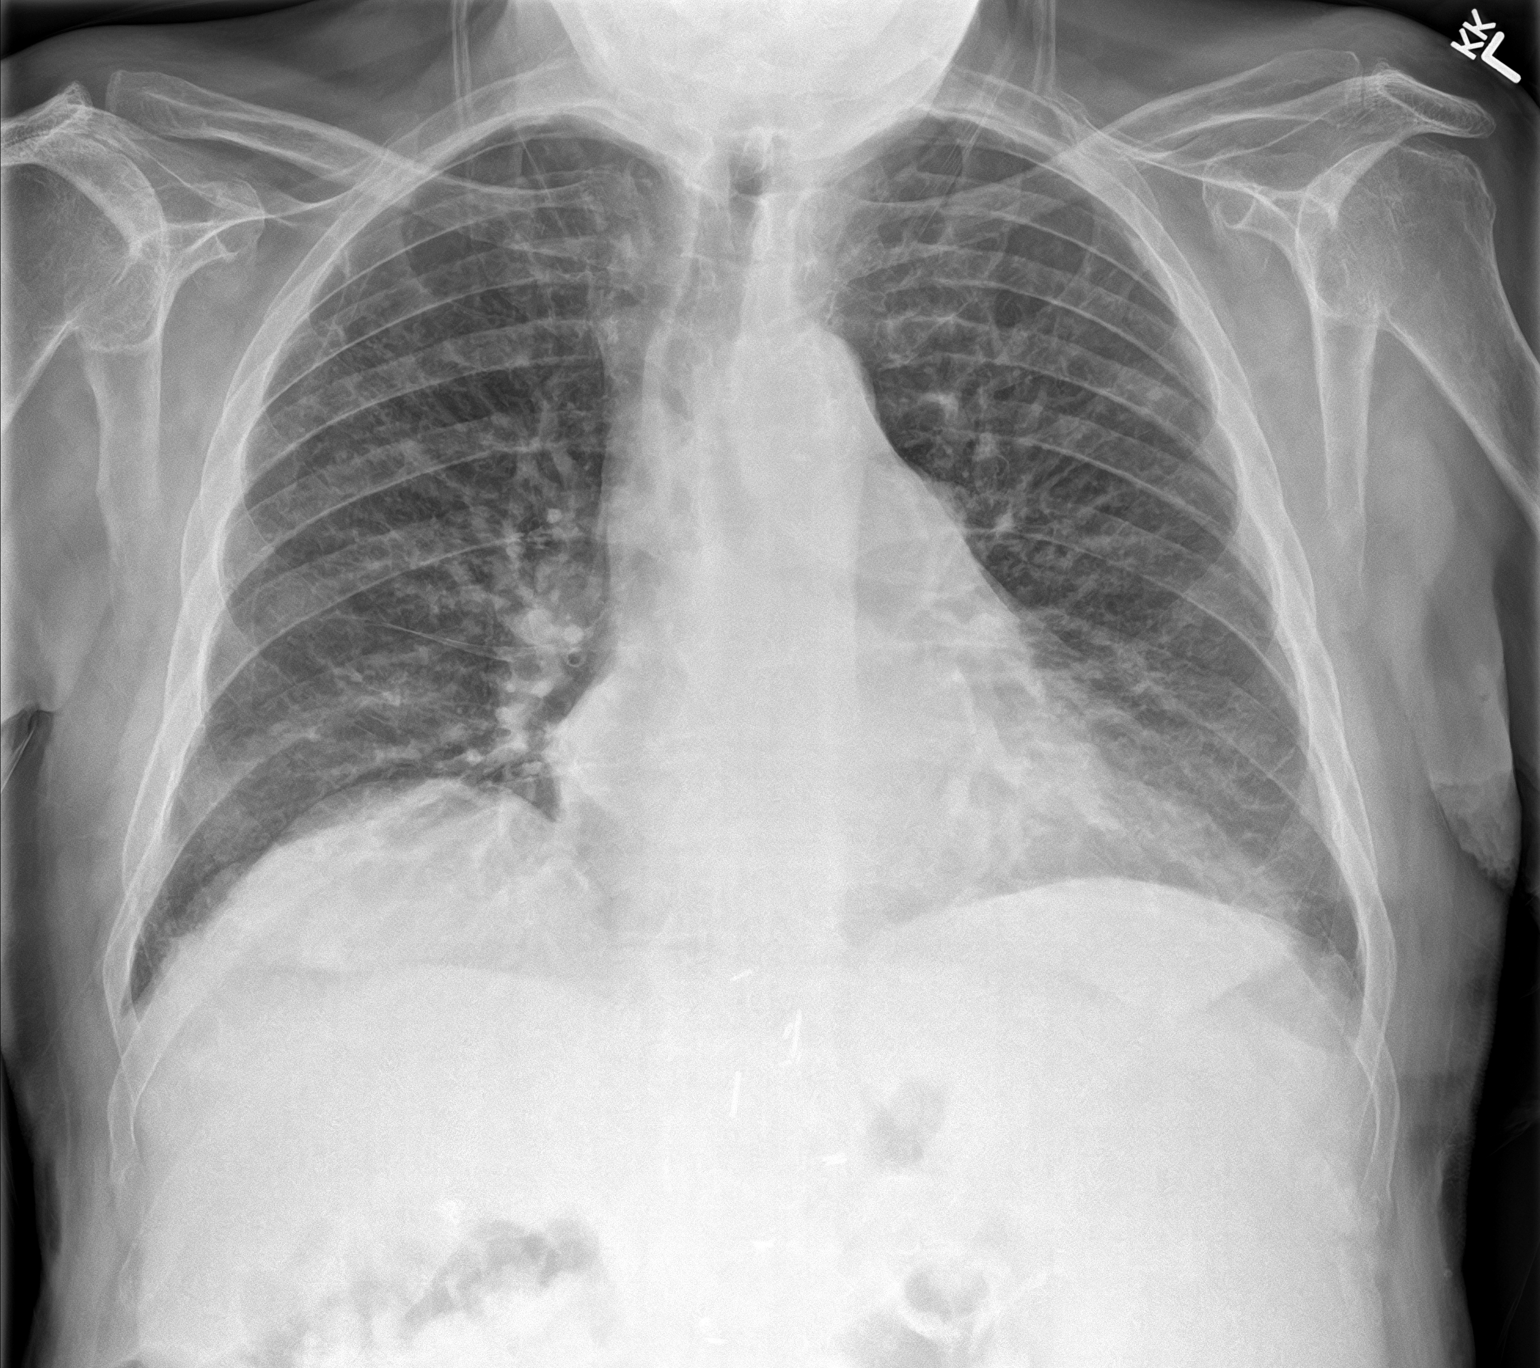
[im 2/2]
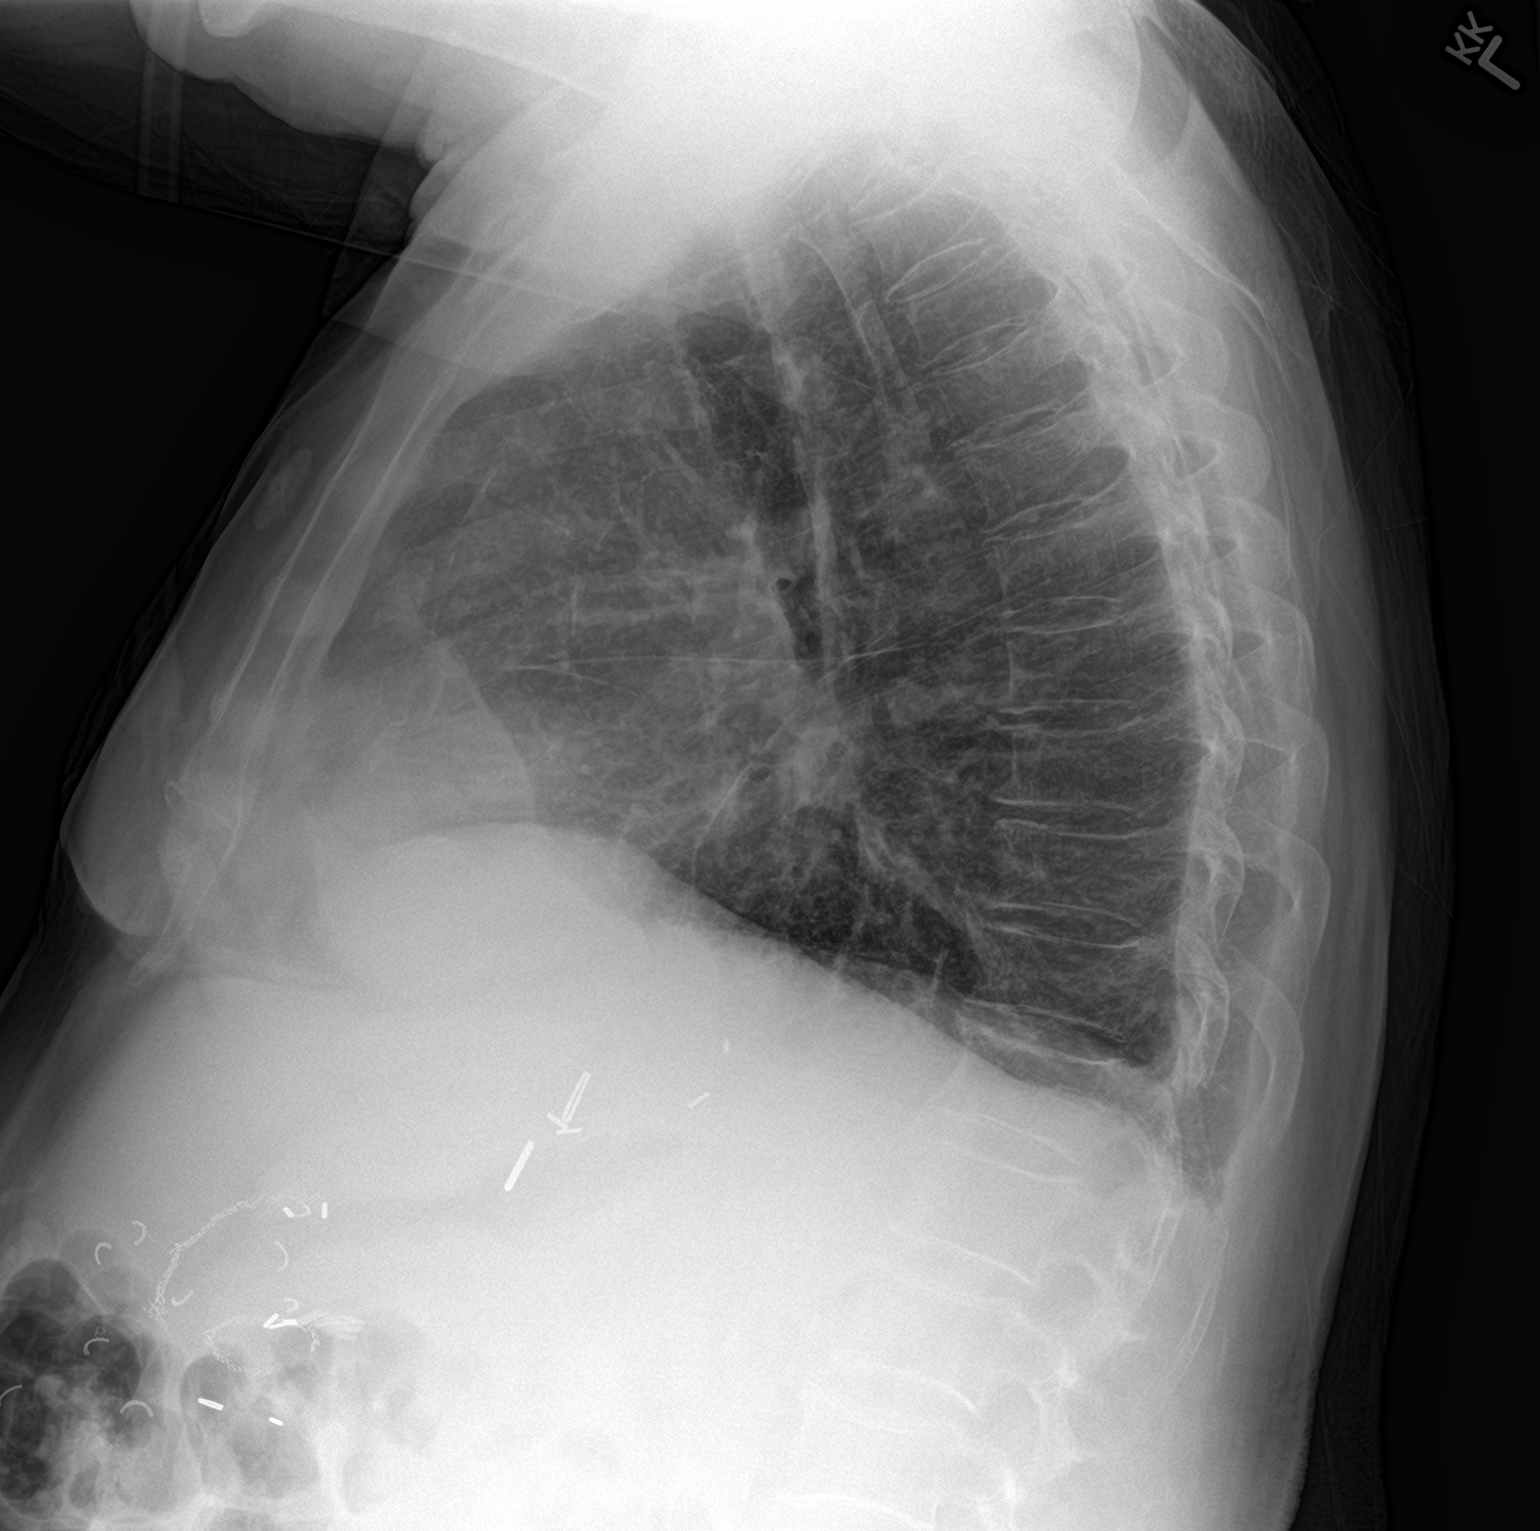

[2 of 2 positions shown; findings below may reference images not displayed]

FINDINGS: Frontal and lateral views of the chest demonstrate an unremarkable
cardiac silhouette. No airspace disease, effusion, or pneumothorax.
No acute bony abnormalities.
IMPRESSION: 1. No acute intrathoracic process.

## 2020-05-29 IMAGING — CT CT ABD-PELV W/ CM
2 of 5 series · 15 of 46 positions shown, 17 images · IV contrast (APPLIED)
Comparison: CT abdomen pelvis dated [DATE].

CLINICAL DATA: 89-year-old male with abdominal distension.

EXAM:
CT ABDOMEN AND PELVIS WITH CONTRAST
TECHNIQUE: Multidetector CT imaging of the abdomen and pelvis was performed
using the standard protocol following bolus administration of
intravenous contrast.
CONTRAST:  75mL OMNIPAQUE IOHEXOL 300 MG/ML  SOLN

[Series 2: routine abd/pel with · axial · 0.97mm/px · z∈[-504,-24]mm · 12 of 108 slices shown, 14 images]
[im 6/108  soft-tissue]
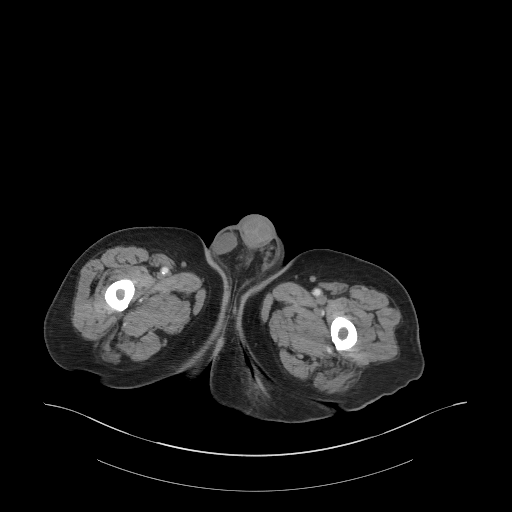
[im 6/108  bone]
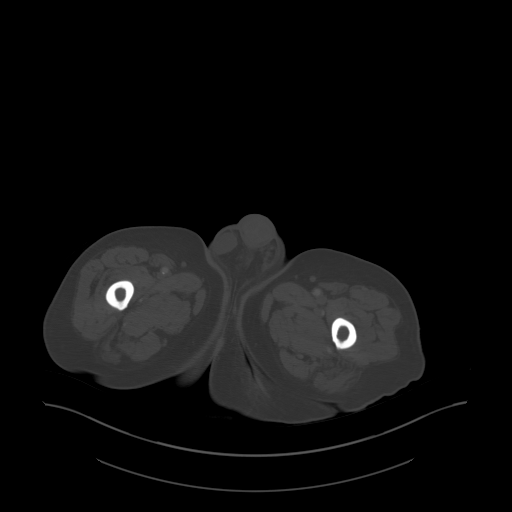
[im 17/108  soft-tissue]
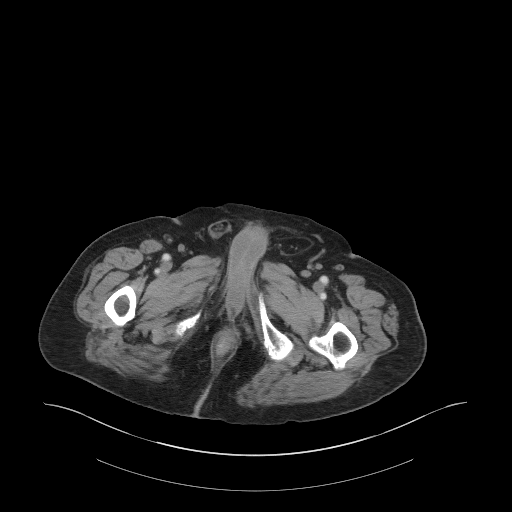
[im 23/108  soft-tissue]
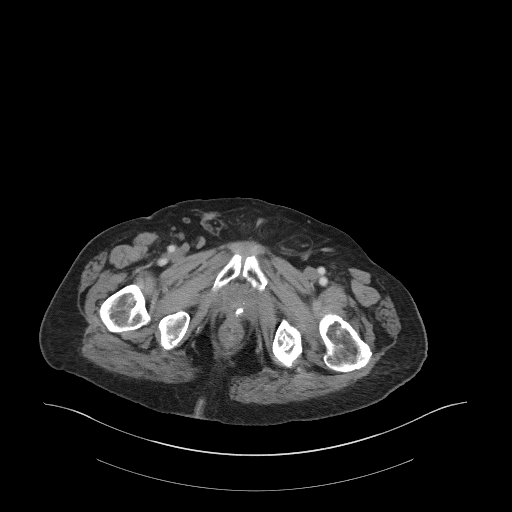
[im 34/108  soft-tissue]
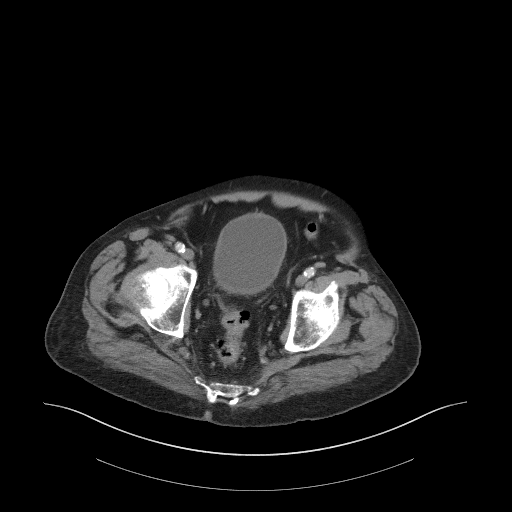
[im 40/108  soft-tissue]
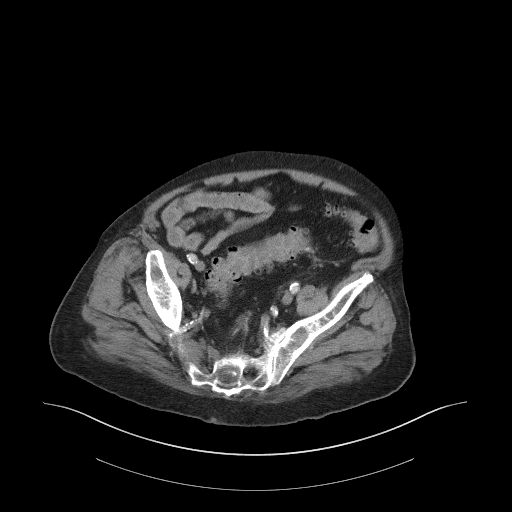
[im 51/108  soft-tissue]
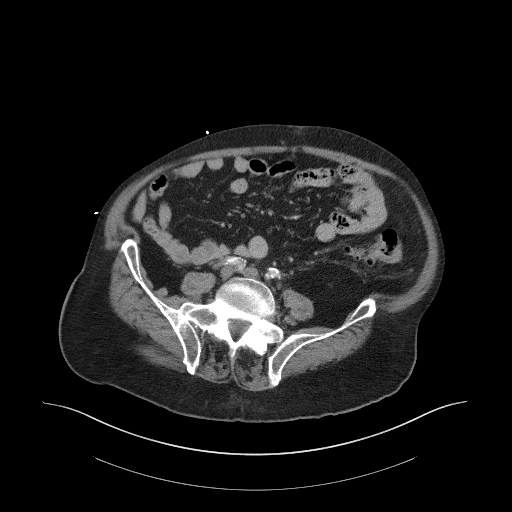
[im 57/108  soft-tissue]
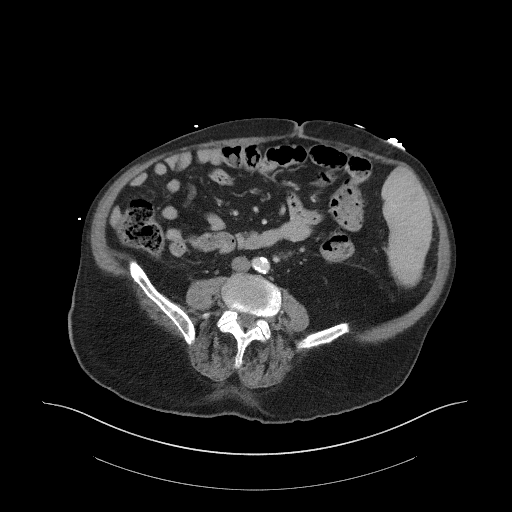
[im 68/108  soft-tissue]
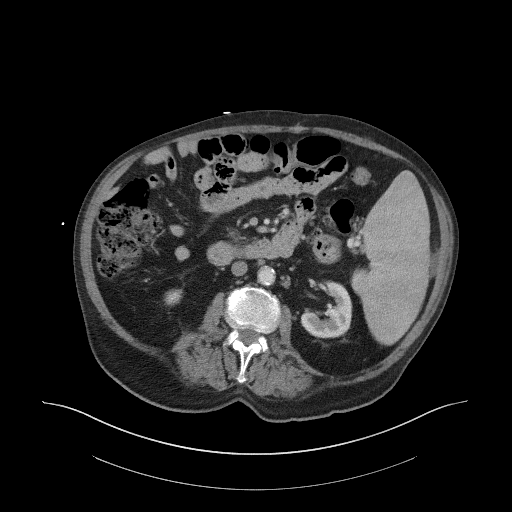
[im 74/108  soft-tissue]
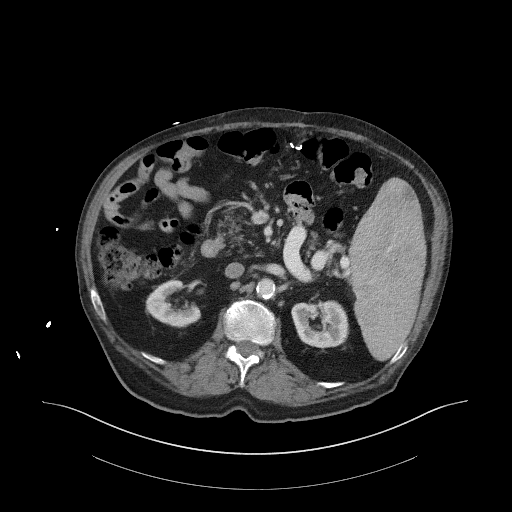
[im 74/108  bone]
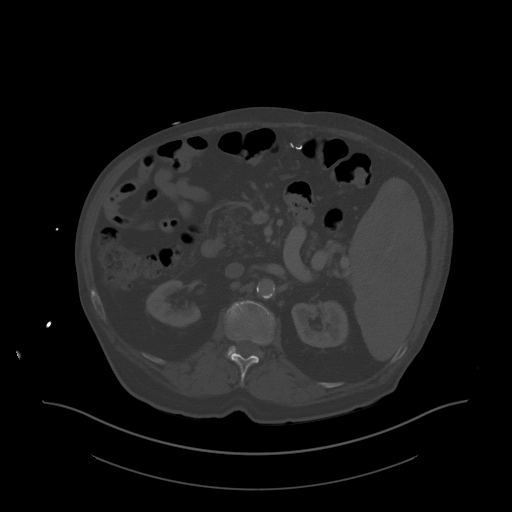
[im 85/108  soft-tissue]
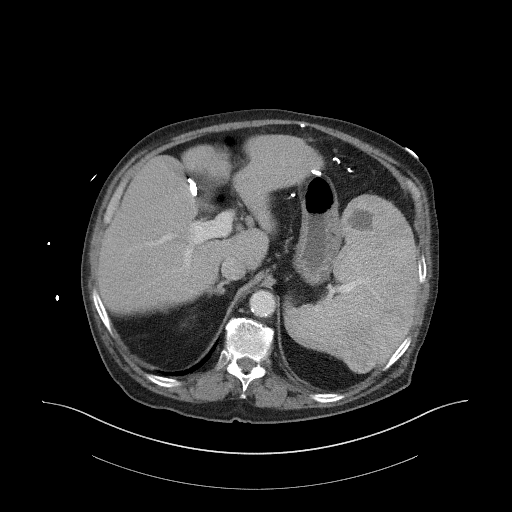
[im 91/108  soft-tissue]
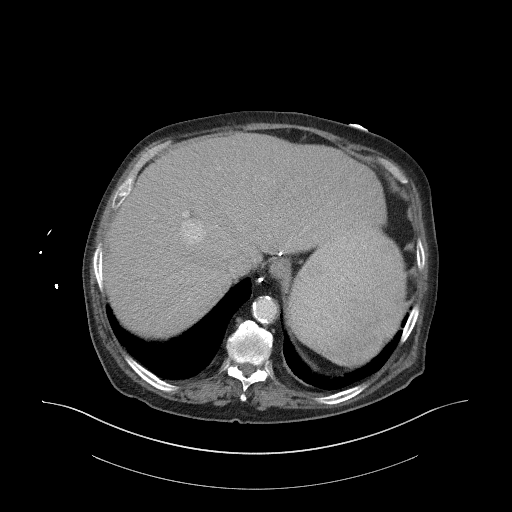
[im 102/108  soft-tissue]
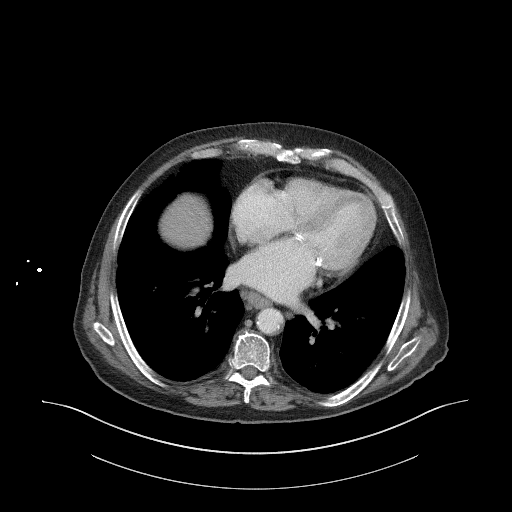

[Series 5: coronal st · coronal · 0.78mm/px · 3 of 107 slices shown]
[im 36/107  soft-tissue]
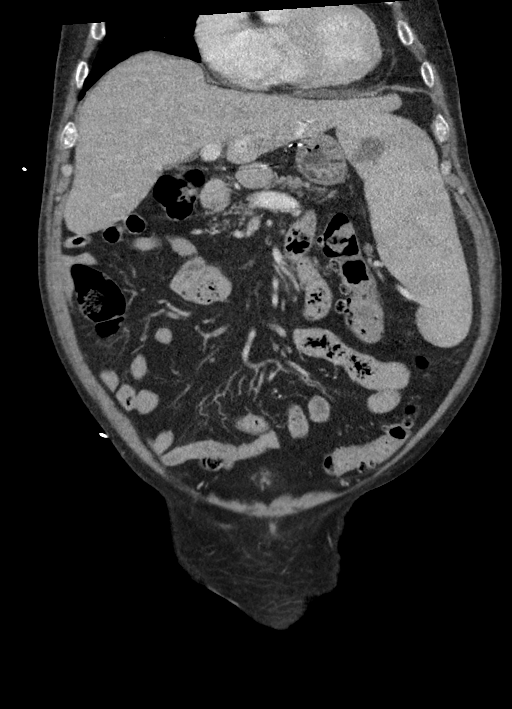
[im 48/107  soft-tissue]
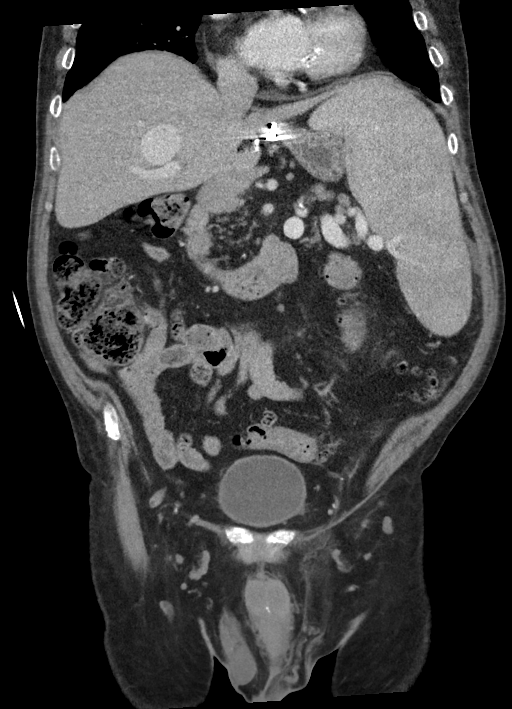
[im 59/107  soft-tissue]
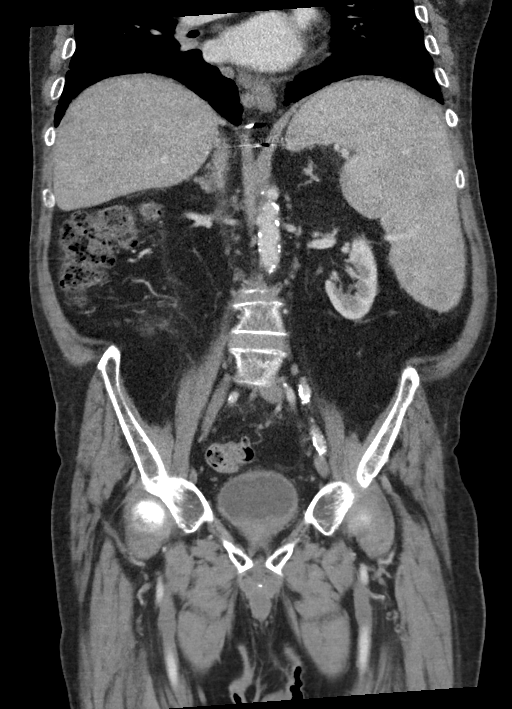

[15 of 46 positions shown; findings below may reference images not displayed]

FINDINGS: Lower chest: The visualized lung bases are clear. There is coronary
vascular calcification.

No intra-abdominal free air or free fluid.

Hepatobiliary: Cirrhosis. No intrahepatic biliary ductal dilatation.
Cholecystectomy. No retained calcified stone noted in the central
CBD.

Pancreas: Unremarkable. No pancreatic ductal dilatation or
surrounding inflammatory changes.

Spleen: Splenomegaly measuring 18 cm in length. Small indeterminate
splenic hypodense lesions.

Adrenals/Urinary Tract: The adrenal glands unremarkable. There is no
hydronephrosis on either side. Subcentimeter right renal upper pole
hypodense focus is too small to characterize. There is symmetric
enhancement and excretion of contrast by both kidneys. The
visualized ureters appear unremarkable. Mildly thickened posterior
bladder wall may be related to underdistention. Cystitis may provide
better evaluation if clinically indicated.

Stomach/Bowel: Postsurgical changes of stomach. There is severe
sigmoid diverticulosis with muscular hypertrophy. No active
inflammatory changes. There is no bowel obstruction. Appendectomy.

Vascular/Lymphatic: Advanced aortoiliac atherosclerotic disease. The
IVC is unremarkable. No portal venous gas. Enlarged splenic and
portal veins in keeping with portal hypertension. Enlarged
retroperitoneal lymph nodes, progressed since the prior CT.
Portacaval lymph node measures 23 mm in short axis.

Reproductive: Mildly enlarged prostate gland with median lobe
hypertrophy. Metallic fiducial markers noted.

Other: None

Musculoskeletal: Osteopenia with degenerative changes of the spine.
No acute osseous pathology.
IMPRESSION: 1. Retroperitoneal adenopathy in the upper abdomen, progressed since
the prior CT.
2. Cirrhosis with evidence of portal hypertension and splenomegaly.
3. Severe sigmoid diverticulosis. No bowel obstruction.
4. Aortic Atherosclerosis ([5X]-[5X]).

## 2020-05-29 MED ORDER — LIDOCAINE VISCOUS HCL 2 % MT SOLN
15.0000 mL | Freq: Once | OROMUCOSAL | Status: AC
  Administered 2020-05-29: 15 mL via ORAL
  Filled 2020-05-29: qty 15

## 2020-05-29 MED ORDER — ALUM & MAG HYDROXIDE-SIMETH 200-200-20 MG/5ML PO SUSP
30.0000 mL | Freq: Once | ORAL | Status: AC
  Administered 2020-05-29: 30 mL via ORAL
  Filled 2020-05-29: qty 30

## 2020-05-29 MED ORDER — PANTOPRAZOLE SODIUM 20 MG PO TBEC: 20.0000 mg | DELAYED_RELEASE_TABLET | Freq: Two times a day (BID) | ORAL | 0 refills | Status: DC

## 2020-05-29 MED ORDER — FAMOTIDINE IN NACL 20-0.9 MG/50ML-% IV SOLN
20.0000 mg | Freq: Once | INTRAVENOUS | Status: AC
  Administered 2020-05-29: 20 mg via INTRAVENOUS
  Filled 2020-05-29: qty 50

## 2020-05-29 MED ORDER — SUCRALFATE 1 G PO TABS: 1.0000 g | ORAL_TABLET | Freq: Three times a day (TID) | ORAL | 0 refills | Status: DC

## 2020-05-29 MED ORDER — SODIUM CHLORIDE 0.9 % IV BOLUS
500.0000 mL | Freq: Once | INTRAVENOUS | Status: AC
  Administered 2020-05-29: 500 mL via INTRAVENOUS

## 2020-05-29 MED ORDER — IOHEXOL 300 MG/ML  SOLN
75.0000 mL | Freq: Once | INTRAMUSCULAR | Status: AC | PRN
  Administered 2020-05-29: 75 mL via INTRAVENOUS

## 2020-05-29 NOTE — Telephone Encounter (Signed)
Noted.  Agree with need to check his blood pressure.  We can complete a visit next week for this if they desire.  If he develops any worsening symptoms or develops any chest pain, shortness of breath, numbness, weakness, or any new symptoms over the weekend he needs to be evaluated.

## 2020-05-29 NOTE — Discharge Instructions (Addendum)
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 It is very important that you continue treatment for this as scheduled.  Your lab work was otherwise very reassuring. Your CT scan of the head was negative.  For now, we are going to increase your pantoprazole antacid from once a day to twice a day. Have also prescribed Carafate which you should take with meals.  I recommend eating smaller, more frequent meals, to help minimize distention of your stomach and reflux.  Try to minimize eating greasy meats. Avoid foods high in acid.

## 2020-05-29 NOTE — Telephone Encounter (Signed)
Spoken to daughter again patient BP is 80/53. I instructed daughter to have patient go be evaluated for his sx. She stated she will inform patient.

## 2020-05-29 NOTE — Telephone Encounter (Signed)
Patient 's daughter called in for father stated that he is having headaches and some indigestion

## 2020-05-29 NOTE — Telephone Encounter (Signed)
Pt c/o BP issue: STAT if pt c/o blurred vision, one-sided weakness or slurred speech  1. What are your last 5 BP readings? 80/55  2. Are you having any other symptoms (ex. Dizziness, headache, blurred vision, passed out)? Indigestion, headache around eyes  3. What is your BP issue? Systolic lower than normal  PCP recommended an appointment be made however daughter would like to speak with nurse first  Please advise

## 2020-05-29 NOTE — ED Notes (Signed)
Spoke with EDP Quentin Cornwall, Per Dr, Quentin Cornwall, order Chest pain protocols.

## 2020-05-29 NOTE — Telephone Encounter (Signed)
Patient stated he is having indigestion, he also stated he has had a headache right at his eyes. He is not having any elevated BP, and he has been taking advil for headache. No fever, chills, nausea, vomiting, chest pain, blurry vision, arm px, jaw px. Patient is currently not with daughter. She had a few questions regarding his medication. All questions where answered. Patients daughter stated she will go check on him today and make sure his bp is good and not being elevated.

## 2020-05-29 NOTE — ED Triage Notes (Signed)
Pt to ED via POV, pt states that for the last several weeks he has been having issues with indigestion. Pt states that he has been using OTC antiacid medication and it has been helping with the indigestion. Pt states that he has also been having headache. Pt states that his daughter was concerned and she called his PCP who advised him to come to the ED to be evaluated. 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 Pt is currently in NAD. Pt denies chest pain.

## 2020-05-29 NOTE — Telephone Encounter (Signed)
Was able to reach back out to pt's daughter Matthew Miles, who reports pt has been having headaches and indigestion. BP readings 80/55 80/53 94/47  Spoke with CMA at PCP office and they advised pt to be evaluated for hypotension, Matthew Miles reprots wanted to call cardiologist office first. Advised for Hypotension is very concerning, not enough blood flow getting back to the heart, pt could be dehydrated or his "indigestion" could actually be cardiac related, pt took his Protonix with little relief. Daughter reports [name] denied dizziness when standing or other symptoms, but also reports that [name] "does not tell much when there is something going on". Matthew Miles will call her brother and have [name] evaluated in the ED for cardiac workout and possible administration of IV fluids. BP at last f/u visit firt of the month was 122/50. No medication changes at that time. Otherwise all questions or concerns were address and no additional concerns at this time. Agreeable to plan for [name] to seek ED for an evaluation, will call back for anything further.

## 2020-05-29 NOTE — ED Provider Notes (Signed)
[facility] Emergency Department Provider Note  ____________________________________________   Event Date/Time   First MD Initiated Contact with Patient 05/29/20 [phone]     (approximate)  I have reviewed the triage vital signs and the nursing notes.   HISTORY  Chief Complaint Headache and Gastroesophageal Reflux    HPI Matthew Miles. is a 87 y.o. male  With h/o CHF, CAD, DM, HTN, HLD, here with multiple complaints.   Main complaint is intermittent, worsening indigestion over the past several days to weeks. Reports he has a h/o chronic indigestion but this has been slightly worse than usual. He describes intermittent fullness sensation in his epigastric area along with belching, reflux. Reports sx improve after belching, occasionally with movement. No specific alleviating factors. He's been taking his prescribed antacids.   Pt also complaints of intermittent aching headache. HA is mild, diffuse, intermittent. No associated numbness, weakness. No photophobia. No vision changes. Sx are fairly intermittent and resolve completely between episodes.       Past Medical History:  Diagnosis Date  . Allergy   . Arthritis   . CHF (congestive heart failure) ([place])   . Chicken pox   . Cholecystitis   . Colon polyps   . COPD (chronic obstructive pulmonary disease) ([place])   . Coronary artery disease   . Diabetes mellitus without complication ([place])   . Emphysema of lung ([place])   . GERD (gastroesophageal reflux disease)   . Heart murmur   . Hematemesis/vomiting blood 04/10/11  . Hypercholesterolemia   . Mild hypertension   . Pancreatitis   . Prostate cancer ([place])   . Prostate cancer [facility])    radiation therapy   . Smoker   . Ulcer   . Upper GI bleed 1980    Patient Active Problem List   Diagnosis Date Noted  . COVID-19 04/06/2020  . PAD (peripheral artery disease) ([place]) 01/09/2020  . Chronic venous insufficiency 01/09/2020  . Elevated TSH 01/01/2020  .  Decreased pedal pulses 01/01/2020  . Lower urinary tract symptoms (LUTS) 06/28/2019  . Dysuria 03/18/2019  . Pressure ulcer 01/16/2019  . Diabetic neuropathy ([place]) 12/31/2018  . Diabetic retinopathy ([place]) 10/07/2018  . Milk intolerance 07/18/2018  . Monoclonal B-cell lymphocytosis 07/18/2018  . Prostate cancer ([place]) 07/04/2018  . BPH (benign prostatic hyperplasia) 02/10/2017  . Left leg pain 10/13/2016  . Falls 08/05/2016  . Chest pain 07/30/2016  . Hip pain, right 02/03/2016  . Actinic keratoses 12/24/2015  . Osteoarthritis of both hands 12/24/2015  . Obesity (BMI 30-39.9) 10/21/2014  . Essential hypertension 07/21/2014  . Murmur, cardiac 10/15/2013  . Chronic low back pain 10/08/2013  . Chronic diastolic CHF (congestive heart failure) ([place]) 04/15/2013  . Thrombocytopenia ([place]) 08/21/2012  . Urge incontinence 07/02/2012  . Hyperlipidemia 06/08/2012  . H/O malignant neoplasm of prostate 06/04/2012  . Diabetes ([place]) 04/26/2012  . Iron deficiency anemia 01/06/2012  . Pulmonary hypertension ([place]) 11/25/2011  . GERD (gastroesophageal reflux disease) 06/13/2011  . Coronary artery disease of native artery of native heart with stable angina pectoris ([place]) 04/26/2011  . Tachycardia 04/26/2011  . COPD (chronic obstructive pulmonary disease) ([place])     Past Surgical History:  Procedure Laterality Date  . APPENDECTOMY    . CARDIAC CATHETERIZATION  May 2002 and Feb 2013   [facility]; no stents   . CATARACT EXTRACTION    . CHOLECYSTECTOMY    . CIRCUMCISION    . COLONOSCOPY    . HEMORRHOID SURGERY    . SP  CHOLECYSTOMY    . STOMACH SURGERY     bleeding ulcers, followed by Dr. [name]    Prior to Admission medications   Medication Sig Start Date End Date Taking? Authorizing Provider  sucralfate (CARAFATE) 1 g tablet Take 1 tablet (1 g total) by mouth 4 (four) times daily -  with meals and at bedtime for 7 days. 05/29/20 06/05/20 Yes Duffy Bruce, MD  ACCU-CHEK GUIDE test strip TEST BLOOD  SUGAR TWICE DAILY AS DIRECTED 03/20/20   [name], MD  alfuzosin (UROXATRAL) 10 MG 24 hr tablet Take 1 tablet (10 mg total) by mouth daily with breakfast. 08/27/19   [name], MD  aspirin 81 MG tablet Take 81 mg by mouth daily.     [provider]  CALCIUM PO Take by mouth daily.    [provider]  carvedilol (COREG) 6.25 MG tablet TAKE 1 TABLET(6.25 MG) BY MOUTH TWICE DAILY 12/18/19   [name], MD  cholecalciferol (VITAMIN D3) 25 MCG (1000 UNIT) tablet Take 1,000 Units by mouth daily.    [provider]  clotrimazole (LOTRIMIN) 1 % cream Apply 1 application topically 2 (two) times daily. 03/20/19   [name], MD  Cyanocobalamin (B-12) 1000 MCG TBCR Take 1 tablet by mouth daily.     [provider]  Dulaglutide (TRULICITY) 1.5 OZ/3.0QM SOPN Inject 1.5 mg into the skin once a week. 02/20/20   [name], MD  FEROSUL 325 (65 Fe) MG tablet TAKE 1 TABLET TWICE DAILY WITH MEALS 05/21/20   [name], MD  Lancets [facility] ULTRASOFT) lancets Use as instructed 08/13/19   [name], MD  metFORMIN (GLUCOPHAGE-XR) 500 MG 24 hr tablet TAKE 2 TABLETS(1000 MG) BY MOUTH TWICE DAILY 02/26/20   [name], MD  nitroGLYCERIN (NITROSTAT) 0.4 MG SL tablet Place 1 tablet (0.4 mg total) under the tongue every 5 (five) minutes as needed. 07/04/16   [name], MD  pantoprazole (PROTONIX) 20 MG tablet Take 1 tablet (20 mg total) by mouth 2 (two) times daily for 14 days. 05/29/20 06/12/20  Duffy Bruce, MD  potassium chloride (KLOR-CON) 10 MEQ tablet TAKE 1 TABLET (10 MEQ) BY MOUTH DAILY 01/14/20   [name], MD  pravastatin (PRAVACHOL) 40 MG tablet TAKE 1 TABLET EVERY DAY 02/05/20   [name], MD  PROAIR HFA 108 [phone] Base) MCG/ACT inhaler INHALE 2 PUFFS INTO THE LUNGS EVERY 6 HOURS AS NEEDED FOR WHEEZING OR SHORTNESS OF BREATH 10/03/16   [name], MD  Tiotropium Bromide Monohydrate (SPIRIVA  RESPIMAT) 2.5 MCG/ACT AERS Inhale 2 puffs into the lungs daily. 04/01/20   [name], MD  torsemide (DEMADEX) 10 MG tablet TAKE 1 TABLET(10 MG) BY MOUTH TWICE DAILY AS NEEDED 02/26/20   [name], MD  Zinc Oxide 13 % CREA Apply as needed to the wound on your left buttocks 01/16/19   [name], MD    Allergies Oxybutynin, Sulfa antibiotics, and Tradjenta [linagliptin]  Family History  Problem Relation Age of Onset  . Prostate cancer Father   . Liver cancer Brother   . Prostate cancer Brother   . Emphysema Sister        smoker    Social History Social History   Tobacco Use  . Smoking status: Former Smoker    Packs/day: 1.00    Years: 65.00    Pack years: 65.00    Types: Cigarettes    Quit date: 04/10/2011  Years since quitting: 9.1  . 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 Vaping Use: Former  Substance Use Topics  . Alcohol use: Yes    Comment: 1 beer rarely  . Drug use: No    Review of Systems  Review of Systems  Constitutional: Positive for fatigue. Negative for chills and fever.  HENT: Negative for sore throat.   Respiratory: Negative for shortness of breath.   Cardiovascular: Negative for chest pain.  Gastrointestinal: Positive for nausea. Negative for abdominal pain.  Genitourinary: Negative for flank pain.  Musculoskeletal: Negative for neck pain.  Skin: Negative for rash and wound.  Allergic/Immunologic: Negative for immunocompromised state.  Neurological: Positive for weakness and headaches. Negative for numbness.  Hematological: Does not bruise/bleed easily.  All other systems reviewed and are negative.    ____________________________________________  PHYSICAL EXAM:      VITAL SIGNS: ED Triage Vitals  Enc Vitals Group     BP 05/29/20 1656 (!) 116/55     Pulse Rate 05/29/20 1656 85     Resp 05/29/20 1656 16     Temp 05/29/20 1656 98 F (36.7 C)     Temp Source 05/29/20 1656 Oral     SpO2  05/29/20 1656 97 %     Weight 05/29/20 1704 166 lb (75.3 kg)     Height 05/29/20 1704 5\' 3"  (1.6 m)     Head Circumference --      Peak Flow --      Pain Score 05/29/20 1704 0     Pain Loc --      Pain Edu? --      Excl. in [place]? --      Physical Exam Vitals and nursing note reviewed.  Constitutional:      General: He is not in acute distress.    Appearance: He is well-developed.  HENT:     Head: Normocephalic and atraumatic.  Eyes:     Conjunctiva/sclera: Conjunctivae normal.  Cardiovascular:     Rate and Rhythm: Normal rate and regular rhythm.     Heart sounds: Normal heart sounds. No murmur heard. No friction rub.  Pulmonary:     Effort: Pulmonary effort is normal. No respiratory distress.     Breath sounds: Normal breath sounds. No wheezing or rales.  Abdominal:     General: There is no distension.     Palpations: Abdomen is soft.     Tenderness: There is no abdominal tenderness.  Musculoskeletal:     Cervical back: Neck supple.  Skin:    General: Skin is warm.     Capillary Refill: Capillary refill takes less than 2 seconds.  Neurological:     Mental Status: He is alert and oriented to person, place, and time.     Cranial Nerves: Cranial nerves are intact.     Sensory: Sensation is intact.     Motor: Motor function is intact. No abnormal muscle tone.       ____________________________________________   LABS (all labs ordered are listed, but only abnormal results are displayed)  Labs Reviewed  BASIC METABOLIC PANEL - Abnormal; Notable for the following components:      Result Value   Glucose, Bld 112 (*)    BUN 26 (*)    Creatinine, Ser 1.37 (*)    Calcium 8.7 (*)    GFR, Estimated 49 (*)    All other components within normal limits  CBC - Abnormal; Notable for the following  components:   WBC 3.8 (*)    RBC 3.12 (*)    Hemoglobin 9.0 (*)    HCT 28.3 (*)    Platelets 74 (*)    All other components within normal limits  HEPATIC FUNCTION PANEL   LIPASE, BLOOD  TROPONIN I (HIGH SENSITIVITY)  TROPONIN I (HIGH SENSITIVITY)    ____________________________________________  EKG: Normal sinus rhythm, VR 88. PR 162, QRS 84, QTc 442. No acute St elevation or depressions. No ischemia or infarct. ________________________________________  RADIOLOGY All imaging, including plain films, CT scans, and ultrasounds, independently reviewed by me, and interpretations confirmed via formal radiology reads.  ED MD interpretation:   CXR: Clear, no acute abnormality CT Head: NAICA CT A/P: Splenomegaly, retroperitoneal LAD  Official radiology report(s): DG Chest 2 View  Result Date: 05/29/2020 CLINICAL DATA:  Chest pain, indigestion chest pain, indigestion EXAM: CHEST - 2 VIEW COMPARISON:  07/30/2016 FINDINGS: Frontal and lateral views of the chest demonstrate an unremarkable cardiac silhouette. No airspace disease, effusion, or pneumothorax. No acute bony abnormalities. IMPRESSION: 1. No acute intrathoracic process. Electronically Signed   By: Randa Ngo M.D.   On: 05/29/2020 17:41   CT Head Wo Contrast  Result Date: 05/29/2020 CLINICAL DATA:  Headache EXAM: CT HEAD WITHOUT CONTRAST TECHNIQUE: Contiguous axial images were obtained from the base of the skull through the vertex without intravenous contrast. COMPARISON:  08/10/2012 FINDINGS: Brain: No acute infarct or hemorrhage. Chronic small vessel ischemic changes are seen within the right basal ganglia and periventricular white matter. Lateral ventricles and midline structures are unremarkable. No acute extra-axial fluid collections. No mass effect. Vascular: Extensive atherosclerosis of the internal carotid arteries unchanged. No hyperdense vessel. Skull: Normal. Negative for fracture or focal lesion. Sinuses/Orbits: No acute finding. Other: None. IMPRESSION: 1. Chronic small vessel ischemic changes as above. No acute intracranial process. Electronically Signed   By: Randa Ngo M.D.   On:  05/29/2020 20:22   CT ABDOMEN PELVIS W CONTRAST  Result Date: 05/29/2020 CLINICAL DATA:  87 year old male with abdominal distension. EXAM: CT ABDOMEN AND PELVIS WITH CONTRAST TECHNIQUE: Multidetector CT imaging of the abdomen and pelvis was performed using the standard protocol following bolus administration of intravenous contrast. CONTRAST:  77mL OMNIPAQUE IOHEXOL 300 MG/ML  SOLN COMPARISON:  CT abdomen pelvis dated 10/26/2017. FINDINGS: Lower chest: The visualized lung bases are clear. There is coronary vascular calcification. No intra-abdominal free air or free fluid. Hepatobiliary: Cirrhosis. No intrahepatic biliary ductal dilatation. Cholecystectomy. No retained calcified stone noted in the central CBD. Pancreas: Unremarkable. No pancreatic ductal dilatation or surrounding inflammatory changes. Spleen: Splenomegaly measuring 18 cm in length. Small indeterminate splenic hypodense lesions. Adrenals/Urinary Tract: The adrenal glands unremarkable. There is no hydronephrosis on either side. Subcentimeter right renal upper pole hypodense focus is too small to characterize. There is symmetric enhancement and excretion of contrast by both kidneys. The visualized ureters appear unremarkable. Mildly thickened posterior bladder wall may be related to underdistention. Cystitis may provide better evaluation if clinically indicated. Stomach/Bowel: Postsurgical changes of stomach. There is severe sigmoid diverticulosis with muscular hypertrophy. No active inflammatory changes. There is no bowel obstruction. Appendectomy. Vascular/Lymphatic: Advanced aortoiliac atherosclerotic disease. The IVC is unremarkable. No portal venous gas. Enlarged splenic and portal veins in keeping with portal hypertension. Enlarged retroperitoneal lymph nodes, progressed since the prior CT. Portacaval lymph node measures 23 mm in short axis. Reproductive: Mildly enlarged prostate gland with median lobe hypertrophy. Metallic fiducial markers  noted. Other: None Musculoskeletal: Osteopenia with degenerative changes of the spine.  No acute osseous pathology. IMPRESSION: 1. Retroperitoneal adenopathy in the upper abdomen, progressed since the prior CT. 2. Cirrhosis with evidence of portal hypertension and splenomegaly. 3. Severe sigmoid diverticulosis. No bowel obstruction. 4. Aortic Atherosclerosis (ICD10-I70.0). Electronically Signed   By: Anner Crete M.D.   On: 05/29/2020 20:29    ____________________________________________  PROCEDURES   Procedure(s) performed (including Critical Care):  Procedures  ____________________________________________  INITIAL IMPRESSION / MDM / [place] / ED COURSE  As part of my medical decision making, I reviewed the following data within the [place] notes reviewed and incorporated, Old chart reviewed, Notes from prior ED visits, and [place] Controlled Substance Database       *Matthew Miles. was evaluated in Emergency Department on 05/29/2020 for the symptoms described in the history of present illness. He was evaluated in the context of the global COVID-19 pandemic, which necessitated consideration that the patient might be at risk for infection with the SARS-CoV-2 virus that causes COVID-19. Institutional protocols and algorithms that pertain to the evaluation of patients at risk for COVID-19 are in a state of rapid change based on information released by regulatory bodies including the CDC and federal and state organizations. These policies and algorithms were followed during the patient's care in the ED.  Some ED evaluations and interventions may be delayed as a result of limited staffing during the pandemic.*     Medical Decision Making: 87 yo M here with headache, indigestion, belching. Suspect mild dehydration contributing to headache, with elevated BUN:Cr ratio. No focal neuro deficits. No signs of CVA clniically. CT head is negative. No fever or red  flags. His primary complaint of indigestion/belching is most c/w likely worsening GERD, possibly related to his marked splenomegaly and perigastric/RP LAD from lymphoma. EKG is non ischemic and trop neg x 2 - doubt ACS. Liver enzymes, lipase normal. BMP shows likely mild dehydration as mentioned. Pt has pancytopenia related to his lymphoma but not acutely worsened. No fever or signs of infection. CT A/P reviewed, shows splenomegaly, RPLAD but is otherwise unremarkable.   Pt feels better with antacids and tx here. Will advise him to eat more frequent, smaller meals. Will increase PPI and carafate. Otherwise will have him f/u with his PCP and oncologist.  ____________________________________________  FINAL CLINICAL IMPRESSION(S) / ED DIAGNOSES  Final diagnoses:  Gastroesophageal reflux disease with esophagitis without hemorrhage  Splenomegaly  Acute nonintractable headache, unspecified headache type     MEDICATIONS GIVEN DURING THIS VISIT:  Medications  sodium chloride 0.9 % bolus 500 mL (0 mLs Intravenous Stopped 05/29/20 2112)  alum & mag hydroxide-simeth (MAALOX/MYLANTA) 200-200-20 MG/5ML suspension 30 mL (30 mLs Oral Given 05/29/20 2025)    And  lidocaine (XYLOCAINE) 2 % viscous mouth solution 15 mL (15 mLs Oral Given 05/29/20 2025)  famotidine (PEPCID) IVPB 20 mg premix (0 mg Intravenous Stopped 05/29/20 2058)  iohexol (OMNIPAQUE) 300 MG/ML solution 75 mL (75 mLs Intravenous Contrast Given 05/29/20 2007)     ED Discharge Orders         Ordered    sucralfate (CARAFATE) 1 g tablet  3 times daily with meals & bedtime        05/29/20 2103    pantoprazole (PROTONIX) 20 MG tablet  2 times daily        05/29/20 2103           Note:  This document was prepared using Dragon voice recognition software and may include unintentional dictation errors.  Duffy Bruce, MD 05/29/20 2236

## 2020-05-29 NOTE — ED Notes (Signed)
Patient transported to CT 

## 2020-06-01 NOTE — Telephone Encounter (Signed)
Noted. Patient was seen in the ED. Please follow-up with him to see how he is feeling now. Thanks.

## 2020-06-03 DIAGNOSIS — C8307 Small cell B-cell lymphoma, spleen: Secondary | ICD-10-CM | POA: Diagnosis not present

## 2020-06-03 DIAGNOSIS — D649 Anemia, unspecified: Secondary | ICD-10-CM | POA: Diagnosis not present

## 2020-06-03 DIAGNOSIS — R9431 Abnormal electrocardiogram [ECG] [EKG]: Secondary | ICD-10-CM | POA: Diagnosis not present

## 2020-06-03 DIAGNOSIS — R000 Tachycardia, unspecified: Secondary | ICD-10-CM | POA: Diagnosis not present

## 2020-06-03 DIAGNOSIS — R519 Headache, unspecified: Secondary | ICD-10-CM | POA: Diagnosis not present

## 2020-06-03 DIAGNOSIS — D696 Thrombocytopenia, unspecified: Secondary | ICD-10-CM | POA: Diagnosis not present

## 2020-06-03 DIAGNOSIS — Z5112 Encounter for antineoplastic immunotherapy: Secondary | ICD-10-CM | POA: Diagnosis not present

## 2020-06-03 DIAGNOSIS — C61 Malignant neoplasm of prostate: Secondary | ICD-10-CM | POA: Diagnosis not present

## 2020-06-03 DIAGNOSIS — K30 Functional dyspepsia: Secondary | ICD-10-CM | POA: Diagnosis not present

## 2020-06-09 ENCOUNTER — Telehealth: Payer: Self-pay | Admitting: Cardiovascular Disease

## 2020-06-09 NOTE — Telephone Encounter (Signed)
Was able to call pt's daughter back, Matthew Miles (DPR approved) regarding her concern. Matthew Miles wants to know if okay for Matthew Miles;s heart can take the antibodies injection of evusheld (not infusion, 2IM injections in hip). Family concern since spoken with [name] in the past about waiting for 4 th COVID booster d/t recnet illness with OCIVD in dec 2021 and his current cancer treatments. Pt has appt at [facility] with oncologist for Ritaxim tomorrow and Matthew Miles will asked oncologist  to hold off on the injection tomorrow and wait until [name] can reach back out with his advice for what is better and safe for Matthew Miles. Matthew Miles understands call back may be a few days, she verbalized understanding and is okay with waiting, will have oncologist hold off on the Evusheld until further notice.

## 2020-06-09 NOTE — Telephone Encounter (Signed)
Patient daughter calling to discuss evusheld injection recommended by cancer center.  Patient recently had reaction to chemo Ritaxim injection and has had some decreased BP's per daughter.  She states patient had covid in December and had only cold symptoms for 4 days.   Patient has an appt with cancer center tomorrow and needs to let them know if he is getting the shot but wants Dr. Gwenyth Ober opinion / approval first.    Please call.

## 2020-06-10 DIAGNOSIS — Z5112 Encounter for antineoplastic immunotherapy: Secondary | ICD-10-CM | POA: Diagnosis not present

## 2020-06-10 DIAGNOSIS — C8307 Small cell B-cell lymphoma, spleen: Secondary | ICD-10-CM | POA: Diagnosis not present

## 2020-06-10 NOTE — Telephone Encounter (Signed)
I will defer to oncology whether they want to give the treatment I am not aware of any cardiac reason why we need to delay his cancer treatment Had COVID in December 2021, should have recovered

## 2020-06-10 NOTE — Telephone Encounter (Signed)
Was able to reach [place] (DPR approved), spoke with her yesterday regarding Evusheld antibodies injection. Routed to [name], he was able to reach back out and advised "I will defer to oncology whether they want to give the treatment I am not aware of any cardiac reason why we need to delay his cancer treatment Had COVID in December 2021, should have recovered" [name] reports glad for the return call, Mr. Durrell was seen at his oncologist today, they discuss the Evushed and both have decided to go through with the injections and feels even better with the decision since [name] input. Will have the injections schedule for next week appts at [facility].

## 2020-06-12 DIAGNOSIS — H2512 Age-related nuclear cataract, left eye: Secondary | ICD-10-CM | POA: Diagnosis not present

## 2020-06-12 DIAGNOSIS — H1045 Other chronic allergic conjunctivitis: Secondary | ICD-10-CM | POA: Diagnosis not present

## 2020-06-12 DIAGNOSIS — Z0100 Encounter for examination of eyes and vision without abnormal findings: Secondary | ICD-10-CM | POA: Diagnosis not present

## 2020-06-12 DIAGNOSIS — E113293 Type 2 diabetes mellitus with mild nonproliferative diabetic retinopathy without macular edema, bilateral: Secondary | ICD-10-CM | POA: Diagnosis not present

## 2020-06-12 LAB — HM DIABETES EYE EXAM

## 2020-06-17 DIAGNOSIS — C8307 Small cell B-cell lymphoma, spleen: Secondary | ICD-10-CM | POA: Diagnosis not present

## 2020-06-17 DIAGNOSIS — Z5112 Encounter for antineoplastic immunotherapy: Secondary | ICD-10-CM | POA: Diagnosis not present

## 2020-06-17 DIAGNOSIS — C61 Malignant neoplasm of prostate: Secondary | ICD-10-CM | POA: Diagnosis not present

## 2020-06-18 ENCOUNTER — Other Ambulatory Visit: Payer: Self-pay | Admitting: Family Medicine

## 2020-06-18 DIAGNOSIS — E1165 Type 2 diabetes mellitus with hyperglycemia: Secondary | ICD-10-CM

## 2020-06-24 DIAGNOSIS — Z5112 Encounter for antineoplastic immunotherapy: Secondary | ICD-10-CM | POA: Diagnosis not present

## 2020-06-24 DIAGNOSIS — C8307 Small cell B-cell lymphoma, spleen: Secondary | ICD-10-CM | POA: Diagnosis not present

## 2020-06-24 DIAGNOSIS — R161 Splenomegaly, not elsewhere classified: Secondary | ICD-10-CM | POA: Diagnosis not present

## 2020-06-25 ENCOUNTER — Ambulatory Visit (INDEPENDENT_AMBULATORY_CARE_PROVIDER_SITE_OTHER): Payer: Medicare HMO | Admitting: Pharmacist

## 2020-06-25 VITALS — Wt 159.0 lb

## 2020-06-25 DIAGNOSIS — J449 Chronic obstructive pulmonary disease, unspecified: Secondary | ICD-10-CM

## 2020-06-25 DIAGNOSIS — E782 Mixed hyperlipidemia: Secondary | ICD-10-CM

## 2020-06-25 DIAGNOSIS — N401 Enlarged prostate with lower urinary tract symptoms: Secondary | ICD-10-CM | POA: Diagnosis not present

## 2020-06-25 DIAGNOSIS — J432 Centrilobular emphysema: Secondary | ICD-10-CM | POA: Diagnosis not present

## 2020-06-25 DIAGNOSIS — I25118 Atherosclerotic heart disease of native coronary artery with other forms of angina pectoris: Secondary | ICD-10-CM

## 2020-06-25 DIAGNOSIS — E119 Type 2 diabetes mellitus without complications: Secondary | ICD-10-CM

## 2020-06-25 DIAGNOSIS — I739 Peripheral vascular disease, unspecified: Secondary | ICD-10-CM

## 2020-06-25 DIAGNOSIS — I5032 Chronic diastolic (congestive) heart failure: Secondary | ICD-10-CM

## 2020-06-25 NOTE — Chronic Care Management (AMB) (Signed)
Chronic Care Management Pharmacy Note  06/25/2020 Name:  Matthew Miles. MRN:  [id number] DOB:  January 04, 1929  Subjective: Matthew Blank. is an 87 y.o. year old male who is a primary patient of Sonnenberg, [name], MD.  The CCM team was consulted for assistance with disease management and care coordination needs.    Engaged with patient by telephone for follow up visit in response to provider referral for pharmacy case management and/or care coordination services. Also then spoke with his daughter, Matthew Miles, at his request.   Consent to Services:  The patient was given information about Chronic Care Management services, agreed to services, and gave verbal consent prior to initiation of services.  Please see initial visit note for detailed documentation.   Patient Care Team: [name], MD as PCP - General (Family Medicine) Rockey Situ Kathlene November, MD as PCP - Cardiology (Cardiology) Minna Merritts, MD as Consulting Physician (Cardiology) De Hollingshead, RPH-CPP as Pharmacist (Pharmacist)  Recent office visits: None since our last call  Recent consult visits:  2/1 - Card[facility]; f/u CHF, HTN, CAD, pHTN -> stable, continue regimen  lasix, APAP  2/23 - rituxan, worsened indigestion, nausea, then SOB -> O2, solu medrol  3/2 - rituxan + premed famotidine; 3/9 - rituxan + evusheld, 3/[facility] visits: 2/18 - ED for hypotension, HA, indigestion sensation -> dehydration, GERD. Tx PPI + carafate    Objective:  Lab Results  Component Value Date   CREATININE 1.37 (H) 05/29/2020   CREATININE 1.01 02/19/2020   CREATININE 1.27 12/30/2019    Lab Results  Component Value Date   HGBA1C 6.8 (H) 12/30/2019   Last diabetic Eye exam:  Lab Results  Component Value Date/Time   HMDIABEYEEXA No Retinopathy 04/12/2017 12:00 AM    Last diabetic Foot exam:  Lab Results  Component Value Date/Time   HMDIABFOOTEX Dr. Milinda Pointer 12/18/2013 12:00 AM         Component Value Date/Time   CHOL 104 03/16/2018 1354   TRIG 118.0 03/16/2018 1354   HDL 28.50 (L) 03/16/2018 1354   CHOLHDL 4 03/16/2018 1354   VLDL 23.6 03/16/2018 1354   LDLCALC 51 03/16/2018 1354   LDLDIRECT 80.0 08/05/2015 1405    Hepatic Function Latest Ref Rng & Units 05/29/2020 12/30/2019 10/29/2018  Total Protein 6.5 - 8.1 g/dL 6.8 6.2 5.9(L)  Albumin 3.5 - 5.0 g/dL 3.9 4.1 4.1  AST 15 - 41 U/L 19 13 14   ALT 0 - 44 U/L 14 12 11   Alk Phosphatase 38 - 126 U/L 73 93 79  Total Bilirubin 0.3 - 1.2 mg/dL 0.7 0.5 0.4  Bilirubin, Direct 0.0 - 0.2 mg/dL 0.1 - -    Lab Results  Component Value Date/Time   TSH 4.92 (H) 12/30/2019 02:16 PM   TSH 3.20 10/31/2013 10:51 AM    CBC Latest Ref Rng & Units 05/29/2020 12/30/2019 01/16/2019  WBC 4.0 - 10.5 K/uL 3.8(L) 4.1 5.3  Hemoglobin 13.0 - 17.0 g/dL 9.0(L) 11.2(L) 11.2(L)  Hematocrit 39.0 - 52.0 % 28.3(L) 33.9(L) 34.7(L)  Platelets 150 - 400 K/uL 74(L) 83.0(L) 100.0(L)    Clinical ASCVD: Yes    Social History   Tobacco Use  Smoking Status Former Smoker  . Packs/day: 1.00  . Years: 65.00  . Pack years: 65.00  . Types: Cigarettes  . Quit date: 04/10/2011  . Years since quitting: 9.2  Smokeless Tobacco Former Systems developer  . Types: Chew   BP Readings from Last 3 Encounters:  05/29/20 120/62  05/12/20 (!) 122/50  04/01/20 118/60   Pulse Readings from Last 3 Encounters:  05/29/20 90  05/12/20 81  04/01/20 91   Wt Readings from Last 3 Encounters:  06/25/20 159 lb (72.1 kg)  05/29/20 166 lb (75.3 kg)  05/12/20 170 lb 4 oz (77.2 kg)    Assessment: Review of patient past medical history, allergies, medications, health status, including review of consultants reports, laboratory and other test data, was performed as part of comprehensive evaluation and provision of chronic care management services.   SDOH:  (Social Determinants of Health) assessments and interventions performed:  SDOH Interventions   Flowsheet Row Most Recent  Value  SDOH Interventions   Financial Strain Interventions Intervention Not Indicated      CCM Care Plan  Allergies  Allergen Reactions  . Oxybutynin     Dry mouth and constant urination.  . Sulfa Antibiotics     GI upset  . Tradjenta [Linagliptin] Other (See Comments)    Hair loss    Medications Reviewed Today    Reviewed by Britt Bottom, CMA (Certified Medical Assistant) on 05/12/20 at 1125  Med List Status: <None>  Medication Order Taking? Sig Documenting Provider Last Dose Status Informant  ACCU-CHEK GUIDE test strip [id number] Yes TEST BLOOD SUGAR TWICE DAILY AS DIRECTED [name], MD Taking Active   alfuzosin (UROXATRAL) 10 MG 24 hr tablet [id number] Yes Take 1 tablet (10 mg total) by mouth daily with breakfast. [name], MD Taking Active   aspirin 81 MG tablet [id number] Yes Take 81 mg by mouth daily.  [provider] Taking Active Self  carvedilol (COREG) 6.25 MG tablet [id number] Yes TAKE 1 TABLET(6.25 MG) BY MOUTH TWICE DAILY [name], MD Taking Active   cholecalciferol (VITAMIN D3) 25 MCG (1000 UNIT) tablet [id number] Yes Take 1,000 Units by mouth daily. [provider] Taking Active   clotrimazole (LOTRIMIN) 1 % cream [id number] Yes Apply 1 application topically 2 (two) times daily. [name], MD Taking Active   Cyanocobalamin (B-12) 1000 MCG TBCR [id number] Yes Take 1 tablet by mouth daily.  [provider] Taking Active Self  Dulaglutide (TRULICITY) 1.5 BO/1.7PZ SOPN [id number] Yes Inject 1.5 mg into the skin once a week. [name], MD Taking Active   FEROSUL 325 [phone] Fe) MG tablet [id number] Yes TAKE 1 TABLET TWICE DAILY WITH MEALS  Patient taking differently: Take 325 mg by mouth daily.   [name], MD Taking Active            Med Note Nat Christen Feb 20, 2020 10:12 AM)    Lancets Glory Rosebush ULTRASOFT) lancets [id number] Yes Use as instructed [name], MD Taking Active    metFORMIN (GLUCOPHAGE-XR) 500 MG 24 hr tablet [id number] Yes TAKE 2 TABLETS(1000 MG) BY MOUTH TWICE DAILY [name], MD Taking Active   nitroGLYCERIN (NITROSTAT) 0.4 MG SL tablet [id number] Yes Place 1 tablet (0.4 mg total) under the tongue every 5 (five) minutes as needed. [name], MD Taking Active Self  pantoprazole (PROTONIX) 20 MG tablet [id number] Yes TAKE 1 TABLET EVERY DAY [name], MD Taking Active   potassium chloride (KLOR-CON) 10 MEQ tablet [id number] Yes TAKE 1 TABLET (10 MEQ) BY MOUTH DAILY [name], MD Taking Active   pravastatin (PRAVACHOL) 40 MG tablet [id number] Yes TAKE 1 TABLET EVERY DAY [name], MD Taking Active   PROAIR HFA 108 [id number] Base) MCG/ACT inhaler  [id number] Yes INHALE 2 PUFFS INTO THE LUNGS EVERY 6 HOURS AS NEEDED FOR WHEEZING OR SHORTNESS OF BREATH [name], MD Taking Active   Tiotropium Bromide Monohydrate (SPIRIVA RESPIMAT) 2.5 MCG/ACT AERS [id number] Yes Inhale 2 puffs into the lungs daily. [name], MD Taking Active   torsemide (DEMADEX) 10 MG tablet [id number] Yes TAKE 1 TABLET(10 MG) BY MOUTH TWICE DAILY AS NEEDED [name], MD Taking Active   Zinc Oxide 13 % CREA [id number] Yes Apply as needed to the wound on your left buttocks [name], MD Taking Active           Patient Active Problem List   Diagnosis Date Noted  . COVID-19 04/06/2020  . PAD (peripheral artery disease) ([place]) 01/09/2020  . Chronic venous insufficiency 01/09/2020  . Elevated TSH 01/01/2020  . Decreased pedal pulses 01/01/2020  . Lower urinary tract symptoms (LUTS) 06/28/2019  . Dysuria 03/18/2019  . Pressure ulcer 01/16/2019  . Diabetic neuropathy ([place]) 12/31/2018  . Diabetic retinopathy ([place]) 10/07/2018  . Milk intolerance 07/18/2018  . Monoclonal B-cell lymphocytosis 07/18/2018  . Prostate cancer ([place]) 07/04/2018  . BPH (benign prostatic hyperplasia) 02/10/2017  . Left leg pain 10/13/2016  . Falls  08/05/2016  . Chest pain 07/30/2016  . Hip pain, right 02/03/2016  . Actinic keratoses 12/24/2015  . Osteoarthritis of both hands 12/24/2015  . Obesity (BMI 30-39.9) 10/21/2014  . Essential hypertension 07/21/2014  . Murmur, cardiac 10/15/2013  . Chronic low back pain 10/08/2013  . Chronic diastolic CHF (congestive heart failure) ([place]) 04/15/2013  . Thrombocytopenia ([place]) 08/21/2012  . Urge incontinence 07/02/2012  . Hyperlipidemia 06/08/2012  . H/O malignant neoplasm of prostate 06/04/2012  . Diabetes ([place]) 04/26/2012  . Iron deficiency anemia 01/06/2012  . Pulmonary hypertension ([place]) 11/25/2011  . GERD (gastroesophageal reflux disease) 06/13/2011  . Coronary artery disease of native artery of native heart with stable angina pectoris ([place]) 04/26/2011  . Tachycardia 04/26/2011  . COPD (chronic obstructive pulmonary disease) ([place])     Immunization History  Administered Date(s) Administered  . Fluad Quad(high Dose 65+) 01/01/2020  . Influenza Split 12/29/2011  . Influenza, High Dose Seasonal PF 12/24/2015, 01/14/2017, 01/31/2018, 01/05/2019  . Influenza,inj,Quad PF,6+ Mos 01/10/2013, 01/17/2014, 01/22/2015  . Influenza-Unspecified 12/20/2011, 01/10/2013, 01/17/2014, 01/22/2015, 01/05/2019  . PFIZER(Purple Top)SARS-COV-2 Vaccination 05/21/2019, 06/11/2019, 11/19/2019  . Pneumococcal Conjugate-13 04/21/2014  . Pneumococcal Polysaccharide-23 12/29/2011  . Tdap 04/07/2011    Conditions to be addressed/monitored: CHF, HTN, HLD, DMII and Cancer  Care Plan : Medication Management  Updates made by De Hollingshead, RPH-CPP since 06/25/2020 12:00 AM    Problem: Diabetes, HTN, Prostate Ca     Long-Range Goal: Disease Progression Prevention   Start Date: 05/07/2020  This Visit's Progress: On track  Recent Progress: On track  Priority: High  Note:   Current Barriers:  . Complex patient with multiple comorbidities increasing risk for hospitalization  Pharmacist Clinical  Goal(s):  Marland Kitchen Over the next 90 days, patient will achieve adherence to monitoring guidelines and medication adherence to achieve therapeutic efficacy through collaboration with PharmD and provider.   Interventions: . 1:1 collaboration with [name], MD regarding development and update of comprehensive plan of care as evidenced by provider attestation and co-signature . Inter-disciplinary care team collaboration (see longitudinal plan of care) . Comprehensive medication review performed; medication list updated in electronic medical record  Health Maintenance: . Eye exam - patient reports he had his eyes checked earlier this month at [facility]  in [place]. His daughter, Matthew Miles, will call over there to have results faxed to [place].   Diabetes: . Controlled; current treatment: metformin 3335 mg BID, Trulicity 1.5 mg weekly o Hx Jardiance - d/t d/t hypotension  . Current glucose readings: fasting glucose: this morning was 122, all readings <130, post prandial glucose: not checking recently . Denies hypoglycemic symptoms . Current meal patterns: notes he "eats what he wants" but in moderation. ~ 20 lbs weight loss since initiation of Trulicity. Notes he likes occasional bread and butter pickles. Getting salads from [place]. Fixing vegetables.  . 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  . Recommend to continue current regimen at this time  Hypertension/HFpEF . Appropriately managed; current treatment: carvedilol 6.25 mg BID, torsemide 20 mg (takes 2 10 mg QAM); follows w/ [name] . Current home readings: reports BP readings around 130/60s with his new arm cuff, but reports his daughter has checked readings with her cuff and readings more in the 110s/50s . Discussed moderation in sodium intake. Rinses canned vegetables before cooking.  . Encouraged to continue periodic monitoring of home BP, along with collaboration with cardiology . Requested refill on  nitroglycerin - has not used, but cancer center recommended he have updated prescription. Will send today under Dr. [name]  Hyperlipidemia: . Controlled per last lipid panel; current treatment: pravastatin 40 mg daily   . Antiplatelet regimen: aspirin 81 mg daily . Recommended to continue current regimen at this time  Chronic Obstructive Pulmonary Disease: . Controlled; current treatment: Spiriva Respimat 2.5 mcg 2 puffs daily; albuterol HFA PRN -reports little need for albuterol since recovery from COVID . GOLD Classification: B . Recommended to  continue current regimen at this time  GERD: . Uncontrolled; s/p ED visit where he was treated w/ 2 weeks of PPI + 1 week of carafate. Continues to take pantoprazole 20 mg daily. Matthew Miles and patient endorse continued reports of acid reflux at night. Patient is taking QAM, not necessarily 30 minutes before a meal. Matthew Miles asks if he needs a refill on carafate . Reviewed need to separate carafate from other medications by 2 hours before and 4 hours after. Discussed that this would add degree of difficulty to his regimen.  . Discussed administering pantoprazole ~ 30 minutes before biggest meal. Also could consider increasing to 40 mg daily. Will discuss w/ PCP Dr. [name]   Hx Prostate Cancer: . PSA increased on last check. F/u with [facility] in ~ 3 months, consideration of bone scan. Still holding Lupron therapy.  . Current treatment: alfuzosin 10 mg daily - notes he hasn't been taking this, he thought we told him to stop. Requests refill.  . Continue current regimen along with collaboration with urology. Communicated with Matthew Miles. She is going to call to request refill  Splenic Marginal Zone B-Cell Lymphoma: . Follows with [facility]. S/p 3 doses rituximab + Evusheld . Continue collaboration with oncology. Patient notes he is feeling good today. Denies nausea, lasting fatigue.   Patient Goals/Self-Care Activities . Over the next 90  days, patient will:  - take medications as prescribed check glucose daily, document, and provide at future appointments check blood pressure periodically, document, and provide at future appointments  Follow Up Plan: Telephone follow up appointment with care management team member scheduled for: ~ 12 weeks      Medication Assistance: None required.  Patient affirms current coverage meets needs.  Patient's preferred pharmacy is:  [id number] [address], [place]  [address] [place] [address] Phone: [phone] Fax: [phone]  [facility] [place], [place] [address] Phone: [phone] Fax: [phone]  Uses pill box? Yes Pt endorses 100% compliance  Follow Up:  Patient agrees to Care Plan and Follow-up.  Plan: Telephone follow up appointment with care management team member scheduled for:  ~ 12 weeks  Catie Darnelle Maffucci, PharmD, [place], [place] Clinical Pharmacist [employer] [facility] [employer] [phone]

## 2020-06-25 NOTE — Patient Instructions (Signed)
Visit Information  PATIENT GOALS: Goals Addressed              This Visit's Progress     Patient Stated   .  Medication Monitoring (pt-stated)        Patient Goals/Self-Care Activities . Over the next 90 days, patient will:  - take medications as prescribed check glucose daily, document, and provide at future appointments check blood pressure periodically, document, and provide at future appointments         The patient verbalized understanding of instructions, educational materials, and care plan provided today and declined offer to receive copy of patient instructions, educational materials, and care plan.   Plan: Telephone follow up appointment with care management team member scheduled for:  ~ 12 weeks  Catie Darnelle Maffucci, PharmD, [place], [place] Clinical Pharmacist [employer] at [employer] [phone]

## 2020-06-26 ENCOUNTER — Ambulatory Visit (INDEPENDENT_AMBULATORY_CARE_PROVIDER_SITE_OTHER): Payer: Medicare HMO

## 2020-06-26 VITALS — Ht 63.0 in | Wt 159.0 lb

## 2020-06-26 DIAGNOSIS — Z0000 Encounter for general adult medical examination without abnormal findings: Secondary | ICD-10-CM

## 2020-06-26 NOTE — Progress Notes (Signed)
Subjective:   Matthew Miles. is a 87 y.o. male who presents for Medicare Annual/Subsequent preventive examination.  Review of Systems    No ROS.  Medicare Wellness Virtual Visit.   Cardiac Risk Factors include: advanced age (>43men, >46 women);male gender;hypertension;diabetes mellitus     Objective:    Today's Vitals   06/26/20 1026  Weight: 159 lb (72.1 kg)  Height: 5\' 3"  (1.6 m)   Body mass index is 28.17 kg/m.  Advanced Directives 05/29/2020 06/26/2019 06/25/2018 04/05/2018 02/21/2018 08/22/2017 06/23/2017  Does Patient Have a Medical Advance Directive? Yes Yes Yes Yes Yes No;Yes Yes  Type of Paramedic of [place];Living will [place];Living will [place];Living will [place];Living will [place];Living will [place]  Does patient want to make changes to medical advance directive? No - Patient declined No - Patient declined No - Patient declined Yes (Inpatient - patient requests chaplain consult to change a medical advance directive) - - No - Patient declined  Copy of [place] in Chart? - No - copy requested No - copy requested No - copy requested - No - copy requested No - copy requested  Would patient like information on creating a medical advance directive? - - - - - - -    Current Medications (verified) Outpatient Encounter Medications as of 06/26/2020  Medication Sig  . ACCU-CHEK GUIDE test strip TEST BLOOD SUGAR TWICE DAILY AS DIRECTED  . alfuzosin (UROXATRAL) 10 MG 24 hr tablet Take 1 tablet (10 mg total) by mouth daily with breakfast. (Patient not taking: Reported on 06/25/2020)  . aspirin 81 MG tablet Take 81 mg by mouth daily.   Marland Kitchen CALCIUM PO Take 1,200 mg by mouth daily. + 1000 units Vit D  . carvedilol (COREG) 6.25 MG tablet TAKE 1 TABLET(6.25 MG) BY MOUTH TWICE DAILY  . cholecalciferol (VITAMIN  D3) 25 MCG (1000 UNIT) tablet Take 1,000 Units by mouth daily.  . clotrimazole (LOTRIMIN) 1 % cream Apply 1 application topically 2 (two) times daily.  . Cyanocobalamin (B-12) 1000 MCG TBCR Take 1 tablet by mouth daily.   . Dulaglutide (TRULICITY) 1.5 QJ/1.9ER SOPN Inject 1.5 mg into the skin once a week.  . FEROSUL 325 (65 Fe) MG tablet TAKE 1 TABLET TWICE DAILY WITH MEALS  . Lancets (ONETOUCH ULTRASOFT) lancets Use as instructed  . metFORMIN (GLUCOPHAGE-XR) 500 MG 24 hr tablet TAKE 2 TABLETS(1000 MG) BY MOUTH TWICE DAILY  . nitroGLYCERIN (NITROSTAT) 0.4 MG SL tablet Place 1 tablet (0.4 mg total) under the tongue every 5 (five) minutes as needed.  . pantoprazole (PROTONIX) 20 MG tablet Take 1 tablet (20 mg total) by mouth 2 (two) times daily for 14 days.  . potassium chloride (KLOR-CON) 10 MEQ tablet TAKE 1 TABLET (10 MEQ) BY MOUTH DAILY  . pravastatin (PRAVACHOL) 40 MG tablet TAKE 1 TABLET EVERY DAY  . PROAIR HFA 108 (90 Base) MCG/ACT inhaler INHALE 2 PUFFS INTO THE LUNGS EVERY 6 HOURS AS NEEDED FOR WHEEZING OR SHORTNESS OF BREATH (Patient not taking: Reported on 06/25/2020)  . Tiotropium Bromide Monohydrate (SPIRIVA RESPIMAT) 2.5 MCG/ACT AERS Inhale 2 puffs into the lungs daily.  Marland Kitchen torsemide (DEMADEX) 10 MG tablet TAKE 1 TABLET(10 MG) BY MOUTH TWICE DAILY AS NEEDED  . Zinc Oxide 13 % CREA Apply as needed to the wound on your left buttocks   No facility-administered encounter medications on file as of  06/26/2020.    Allergies (verified) Oxybutynin, Sulfa antibiotics, and Tradjenta [linagliptin]   History: Past Medical History:  Diagnosis Date  . Allergy   . Arthritis   . CHF (congestive heart failure) ([place])   . Chicken pox   . Cholecystitis   . Colon polyps   . COPD (chronic obstructive pulmonary disease) ([place])   . Coronary artery disease   . Diabetes mellitus without complication ([place])   . Emphysema of lung ([place])   . GERD (gastroesophageal reflux disease)   . Heart murmur   .  Hematemesis/vomiting blood 04/10/11  . Hypercholesterolemia   . Mild hypertension   . Pancreatitis   . Prostate cancer ([place])   . Prostate cancer [facility])    radiation therapy   . Smoker   . Ulcer   . Upper GI bleed 1980   Past Surgical History:  Procedure Laterality Date  . APPENDECTOMY    . CARDIAC CATHETERIZATION  May 2002 and Feb 2013   [facility]; no stents   . CATARACT EXTRACTION    . CHOLECYSTECTOMY    . CIRCUMCISION    . COLONOSCOPY    . HEMORRHOID SURGERY    . SP CHOLECYSTOMY    . STOMACH SURGERY     bleeding ulcers, followed by Dr. [name]   Family History  Problem Relation Age of Onset  . Prostate cancer Father   . Liver cancer Brother   . Prostate cancer Brother   . Emphysema Sister        smoker   Social History   Socioeconomic History  . Marital status: Widowed    Spouse name: Not on file  . Number of children: 2  . Years of education: Not on file  . Highest education level: Not on file  Occupational History  . 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 Smoking status: Former Smoker    Packs/day: 1.00    Years: 65.00    Pack years: 65.00    Types: Cigarettes    Quit date: 04/10/2011    Years since quitting: 9.2  . 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 Vaping Use: Former  Substance and Sexual Activity  . Alcohol use: Yes    Comment: 1 beer rarely  . Drug use: No  . Sexual activity: Not Currently  Other Topics Concern  . Not on file  Social History Narrative   Lives in [place] alone. Wife in nursing home. Has 2 children.      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 Difficulty of Paying Living Expenses: Not hard at all  Food Insecurity: No Food Insecurity  . Worried About Charity fundraiser in the Last Year: Never true  . Ran Out of Food in the Last Year: Never  true  Transportation Needs: No Transportation Needs  . Lack of Transportation (Medical): No  . Lack of Transportation (Non-Medical): No  Physical Activity: Insufficiently Active  . Days of Exercise per Week: 3 days  . Minutes of Exercise per Session: 30 min  Stress: No Stress Concern Present  . Feeling of Stress : Not at all  Social Connections: Unknown  . Frequency of Communication with Friends and Family: More than three times a week  . Frequency of  Social Gatherings with Friends and Family: More than three times a week  . Attends Religious Services: Not on file  . Active Member of Clubs or Organizations: Yes  . Attends Archivist Meetings: Not on file  . Marital Status: Widowed    Tobacco Counseling Counseling given: Not Answered   Clinical Intake:  Pre-visit preparation completed: Yes        Diabetes: Yes (Followed by pcp)   Nutrition Risk Assessment: Has the patient had any N/V/D within the last 2 weeks?  No  Does the patient have any non-healing wounds?  No  Has the patient had any unintentional weight loss or weight gain?  No   Diabetes: If diabetic, was a CBG obtained today?  Yes , FBS 122 Did the patient bring in their glucometer from home?  No  How often do you monitor your CBG's? Daily.    Diabetes Management: Is the patient seen by Chronic Care Management for management of their diabetes?  Yes   How often do you need to have someone help you when you read instructions, pamphlets, or other written materials from your doctor or pharmacy?: 1 - Never    Interpreter Needed?: No      Activities of Daily Living In your present state of health, do you have any difficulty performing the following activities: 06/26/2020  Hearing? Y  Comment Hearing aids  Vision? N  Difficulty concentrating or making decisions? N  Walking or climbing stairs? Y  Comment Unsteady gait. Cane in use.  Dressing or bathing? N  Doing errands, shopping? N  Preparing  Food and eating ? N  Using the Toilet? N  In the past six months, have you accidently leaked urine? Y  Comment Managed with daily brief  Do you have problems with loss of bowel control? N  Managing your Medications? N  Managing your Finances? N  Housekeeping or managing your Housekeeping? N  Some recent data might be hidden    Patient Care Team: [name], MD as PCP - General (Family Medicine) Rockey Situ Kathlene November, MD as PCP - Cardiology (Cardiology) Minna Merritts, MD as Consulting Physician (Cardiology) De Hollingshead, RPH-CPP as Pharmacist (Pharmacist)  Indicate any recent Medical Services you may have received from other than [facility] providers in the past year (date may be approximate).     Assessment:   This is a routine wellness examination for Luisalberto.  I connected with Davonn today by telephone and verified that I am speaking with the correct person using two identifiers. Location patient: home Location provider: work Persons participating in the virtual visit: patient, Marine scientist.    I discussed the limitations, risks, security and privacy concerns of performing an evaluation and management service by telephone and the availability of in person appointments. The patient expressed understanding and verbally consented to this telephonic visit.    Interactive audio and video telecommunications were attempted between this provider and patient, however failed, due to patient having technical difficulties OR patient did not have access to video capability.  We continued and completed visit with audio only.  Some vital signs may be absent or patient reported.   Hearing/Vision screen  Hearing Screening   125Hz  250Hz  500Hz  1000Hz  2000Hz  3000Hz  4000Hz  6000Hz  8000Hz   Right ear:           Left ear:           Comments: Hearing aid  Vision Screening Comments: Wears corrective lenses Visual acuity not assessed, virtual visit.  They have seen their ophthalmologist in the last 12  months.     Dietary issues and exercise activities discussed: Current Exercise Habits: Home exercise routine, Type of exercise: walking (Walks at [employer]), Frequency (Times/Week): 3, Intensity: Mild  Healthy diet Good water  Goals      Patient Stated   .  Medication Monitoring (pt-stated)      Patient Goals/Self-Care Activities . Over the next 90 days, patient will:  - take medications as prescribed check glucose daily, document, and provide at future appointments check blood pressure periodically, document, and provide at future appointments        Other   .  Healthy Lifestyle      Increase bicycling as tolerated Manage blood sugar Healthy diet Stay hydrated      Depression Screen PHQ 2/9 Scores 06/26/2020 04/01/2020 12/30/2019 08/27/2019 06/26/2019 03/18/2019 01/23/2019  PHQ - 2 Score 0 0 0 0 0 0 0    Fall Risk Fall Risk  06/26/2020 04/01/2020 12/30/2019 08/27/2019 06/26/2019  Falls in the past year? 0 0 0 1 0  Number falls in past yr: 0 0 0 0 -  Injury with Fall? 0 - - 0 -  Follow up Falls evaluation completed Falls evaluation completed Falls evaluation completed Falls evaluation completed Falls evaluation completed    Energy: Handrails in use when climbing stairs? Yes Home free of loose throw rugs in walkways, pet beds, electrical cords, etc? Yes  Adequate lighting in your home to reduce risk of falls? Yes   ASSISTIVE DEVICES UTILIZED TO PREVENT FALLS: Life alert? No   Cell phone on person- Yes Use of a cane, walker or w/c? Yes   TIMED UP AND GO: Was the test performed? No . Virtual visit.   Cognitive Function: MMSE - Mini Mental State Exam 06/22/2016 06/23/2015  Orientation to time 5 5  Orientation to Place 5 5  Registration 3 3  Attention/ Calculation 5 5  Recall 3 3  Language- name 2 objects 2 2  Language- repeat 1 1  Language- follow 3 step command 3 3  Language- read & follow direction 1 1  Write a sentence 1 1   Copy design 1 1  Total score 30 30     6CIT Screen 06/26/2019 06/25/2018 06/23/2017  What Year? 0 points 0 points 0 points  What month? 0 points 0 points 0 points  What time? 0 points 0 points 0 points  Count back from 20 0 points 0 points 0 points  Months in reverse 0 points 0 points 0 points  Repeat phrase - 0 points 0 points  Total Score - 0 0    Immunizations Immunization History  Administered Date(s) Administered  . Fluad Quad(high Dose 65+) 01/01/2020  . Influenza Split 12/29/2011  . Influenza, High Dose Seasonal PF 12/24/2015, 01/14/2017, 01/31/2018, 01/05/2019  . Influenza,inj,Quad PF,6+ Mos 01/10/2013, 01/17/2014, 01/22/2015  . Influenza-Unspecified 12/20/2011, 01/10/2013, 01/17/2014, 01/22/2015, 01/05/2019  . PFIZER(Purple Top)SARS-COV-2 Vaccination 05/21/2019, 06/11/2019, 11/19/2019, 01/20/2020  . Pneumococcal Conjugate-13 04/21/2014  . Pneumococcal Polysaccharide-23 12/29/2011  . Tdap 04/07/2011   Health Maintenance Health Maintenance  Topic Date Due  . HEMOGLOBIN A1C  06/28/2020  . COVID-19 Vaccine (4 - Booster for Pfizer series) 07/20/2020  . URINE MICROALBUMIN  12/29/2020  . FOOT EXAM  12/31/2020  . TETANUS/TDAP  04/06/2021  . OPHTHALMOLOGY EXAM  06/12/2021  . INFLUENZA VACCINE  Completed  . PNA vac Low Risk Adult  Completed  . HPV VACCINES  Aged Out   Colorectal cancer screening: No longer required.   DG Chest 2 view: completed 05/29/20.   Vision Screening: Recommended annual ophthalmology exams for early detection of glaucoma and other disorders of the eye. Is the patient up to date with their annual eye exam?  Yes  Who is the provider or what is the name of the office in which the patient attends annual eye exams? Dr. Ellin Mayhew.   Dental Screening: Recommended annual dental exams for proper oral hygiene. Dentures.  Community Resource Referral / Chronic Care Management: CRR required this visit?  No   CCM required this visit?  No      Plan:   Keep  all routine maintenance appointments.   Follow up 07/31/20 @ 1:15  I have personally reviewed and noted the following in the patient's chart:   . Medical and social history . Use of alcohol, tobacco or illicit drugs  . Current medications and supplements . Functional ability and status . Nutritional status . Physical activity . Advanced directives . List of other physicians . Hospitalizations, surgeries, and ER visits in previous 12 months . Vitals . Screenings to include cognitive, depression, and falls . Referrals and appointments  In addition, I have reviewed and discussed with patient certain preventive protocols, quality metrics, and best practice recommendations. A written personalized care plan for preventive services as well as general preventive health recommendations were provided to patient via mychart.     Varney Biles, LPN   07/25/6061

## 2020-06-26 NOTE — Patient Instructions (Addendum)
Matthew Miles , Thank you for taking time to come for your Medicare Wellness Visit. I appreciate your ongoing commitment to your health goals. Please review the following plan we discussed and let me know if I can assist you in the future.   These are the goals we discussed: Goals      Patient Stated   .  Medication Monitoring (pt-stated)      Patient Goals/Self-Care Activities . Over the next 90 days, patient will:  - take medications as prescribed check glucose daily, document, and provide at future appointments check blood pressure periodically, document, and provide at future appointments        Other   .  Healthy Lifestyle      Increase bicycling as tolerated Manage blood sugar Healthy diet Stay hydrated       This is a list of the screening recommended for you and due dates:  Health Maintenance  Topic Date Due  . Hemoglobin A1C  06/28/2020  . COVID-19 Vaccine (4 - Booster for Pfizer series) 07/20/2020  . Urine Protein Check  12/29/2020  . Complete foot exam   12/31/2020  . Tetanus Vaccine  04/06/2021  . Eye exam for diabetics  06/12/2021  . Flu Shot  Completed  . Pneumonia vaccines  Completed  . HPV Vaccine  Aged Out     Immunizations Immunization History  Administered Date(s) Administered  . Fluad Quad(high Dose 65+) 01/01/2020  . Influenza Split 12/29/2011  . Influenza, High Dose Seasonal PF 12/24/2015, 01/14/2017, 01/31/2018, 01/05/2019  . Influenza,inj,Quad PF,6+ Mos 01/10/2013, 01/17/2014, 01/22/2015  . Influenza-Unspecified 12/20/2011, 01/10/2013, 01/17/2014, 01/22/2015, 01/05/2019  . PFIZER(Purple Top)SARS-COV-2 Vaccination 05/21/2019, 06/11/2019, 11/19/2019, 01/20/2020  . Pneumococcal Conjugate-13 04/21/2014  . Pneumococcal Polysaccharide-23 12/29/2011  . Tdap 04/07/2011   Keep all routine maintenance appointments.   Follow up 07/31/20 @ 1:15  Advanced directives: End of life planning; Advance aging; Advanced directives discussed.  Copy of  current HCPOA/Living Will requested.    Conditions/risks identified: none new.   Follow up in one year for your annual wellness visit.   Preventive Care 7 Years and Older, Male Preventive care refers to lifestyle choices and visits with your health care provider that can promote health and wellness. What does preventive care include?  A yearly physical exam. This is also called an annual well check.  Dental exams once or twice a year.  Routine eye exams. Ask your health care provider how often you should have your eyes checked.  Personal lifestyle choices, including:  Daily care of your teeth and gums.  Regular physical activity.  Eating a healthy diet.  Avoiding tobacco and drug use.  Limiting alcohol use.  Practicing safe sex.  Taking low doses of aspirin every day.  Taking vitamin and mineral supplements as recommended by your health care provider. What happens during an annual well check? The services and screenings done by your health care provider during your annual well check will depend on your age, overall health, lifestyle risk factors, and family history of disease. Counseling  Your health care provider may ask you questions about your:  Alcohol use.  Tobacco use.  Drug use.  Emotional well-being.  Home and relationship well-being.  Sexual activity.  Eating habits.  History of falls.  Memory and ability to understand (cognition).  Work and work Statistician. Screening  You may have the following tests or measurements:  Height, weight, and BMI.  Blood pressure.  Lipid and cholesterol levels. These may be checked every 5 years,  or more frequently if you are over 54 years old.  Skin check.  Lung cancer screening. You may have this screening every year starting at age 34 if you have a 30-pack-year history of smoking and currently smoke or have quit within the past 15 years.  Fecal occult blood test (FOBT) of the stool. You may have this  test every year starting at age 110.  Flexible sigmoidoscopy or colonoscopy. You may have a sigmoidoscopy every 5 years or a colonoscopy every 10 years starting at age 25.  Prostate cancer screening. Recommendations will vary depending on your family history and other risks.  Hepatitis C blood test.  Hepatitis B blood test.  Sexually transmitted disease (STD) testing.  Diabetes screening. This is done by checking your blood sugar (glucose) after you have not eaten for a while (fasting). You may have this done every 1-3 years.  Abdominal aortic aneurysm (AAA) screening. You may need this if you are a current or former smoker.  Osteoporosis. You may be screened starting at age 29 if you are at high risk. Talk with your health care provider about your test results, treatment options, and if necessary, the need for more tests. Vaccines  Your health care provider may recommend certain vaccines, such as:  Influenza vaccine. This is recommended every year.  Tetanus, diphtheria, and acellular pertussis (Tdap, Td) vaccine. You may need a Td booster every 10 years.  Zoster vaccine. You may need this after age 59.  Pneumococcal 13-valent conjugate (PCV13) vaccine. One dose is recommended after age 33.  Pneumococcal polysaccharide (PPSV23) vaccine. One dose is recommended after age 89. Talk to your health care provider about which screenings and vaccines you need and how often you need them. This information is not intended to replace advice given to you by your health care provider. Make sure you discuss any questions you have with your health care provider. Document Released: 04/24/2015 Document Revised: 12/16/2015 Document Reviewed: 01/27/2015 Elsevier Interactive Patient Education  2017 [employer] Prevention in the Home Falls can cause injuries. They can happen to people of all ages. There are many things you can do to make your home safe and to help prevent falls. What can I do  on the outside of my home?  Regularly fix the edges of walkways and driveways and fix any cracks.  Remove anything that might make you trip as you walk through a door, such as a raised step or threshold.  Trim any bushes or trees on the path to your home.  Use bright outdoor lighting.  Clear any walking paths of anything that might make someone trip, such as rocks or tools.  Regularly check to see if handrails are loose or broken. Make sure that both sides of any steps have handrails.  Any raised decks and porches should have guardrails on the edges.  Have any leaves, snow, or ice cleared regularly.  Use sand or salt on walking paths during winter.  Clean up any spills in your garage right away. This includes oil or grease spills. What can I do in the bathroom?  Use night lights.  Install grab bars by the toilet and in the tub and shower. Do not use towel bars as grab bars.  Use non-skid mats or decals in the tub or shower.  If you need to sit down in the shower, use a plastic, non-slip stool.  Keep the floor dry. Clean up any water that spills on the floor as soon as  it happens.  Remove soap buildup in the tub or shower regularly.  Attach bath mats securely with double-sided non-slip rug tape.  Do not have throw rugs and other things on the floor that can make you trip. What can I do in the bedroom?  Use night lights.  Make sure that you have a light by your bed that is easy to reach.  Do not use any sheets or blankets that are too big for your bed. They should not hang down onto the floor.  Have a firm chair that has side arms. You can use this for support while you get dressed.  Do not have throw rugs and other things on the floor that can make you trip. What can I do in the kitchen?  Clean up any spills right away.  Avoid walking on wet floors.  Keep items that you use a lot in easy-to-reach places.  If you need to reach something above you, use a strong  step stool that has a grab bar.  Keep electrical cords out of the way.  Do not use floor polish or wax that makes floors slippery. If you must use wax, use non-skid floor wax.  Do not have throw rugs and other things on the floor that can make you trip. What can I do with my stairs?  Do not leave any items on the stairs.  Make sure that there are handrails on both sides of the stairs and use them. Fix handrails that are broken or loose. Make sure that handrails are as long as the stairways.  Check any carpeting to make sure that it is firmly attached to the stairs. Fix any carpet that is loose or worn.  Avoid having throw rugs at the top or bottom of the stairs. If you do have throw rugs, attach them to the floor with carpet tape.  Make sure that you have a light switch at the top of the stairs and the bottom of the stairs. If you do not have them, ask someone to add them for you. What else can I do to help prevent falls?  Wear shoes that:  Do not have high heels.  Have rubber bottoms.  Are comfortable and fit you well.  Are closed at the toe. Do not wear sandals.  If you use a stepladder:  Make sure that it is fully opened. Do not climb a closed stepladder.  Make sure that both sides of the stepladder are locked into place.  Ask someone to hold it for you, if possible.  Clearly mark and make sure that you can see:  Any grab bars or handrails.  First and last steps.  Where the edge of each step is.  Use tools that help you move around (mobility aids) if they are needed. These include:  Canes.  Walkers.  Scooters.  Crutches.  Turn on the lights when you go into a dark area. Replace any light bulbs as soon as they burn out.  Set up your furniture so you have a clear path. Avoid moving your furniture around.  If any of your floors are uneven, fix them.  If there are any pets around you, be aware of where they are.  Review your medicines with your doctor.  Some medicines can make you feel dizzy. This can increase your chance of falling. Ask your doctor what other things that you can do to help prevent falls. This information is not intended to replace advice given to you  by your health care provider. Make sure you discuss any questions you have with your health care provider. Document Released: 01/22/2009 Document Revised: 09/03/2015 Document Reviewed: 05/02/2014 Elsevier Interactive Patient Education  2017 [employer].

## 2020-06-29 ENCOUNTER — Ambulatory Visit: Payer: Medicare HMO | Admitting: Pharmacist

## 2020-06-29 DIAGNOSIS — E119 Type 2 diabetes mellitus without complications: Secondary | ICD-10-CM

## 2020-06-29 DIAGNOSIS — J449 Chronic obstructive pulmonary disease, unspecified: Secondary | ICD-10-CM | POA: Diagnosis not present

## 2020-06-29 DIAGNOSIS — I5032 Chronic diastolic (congestive) heart failure: Secondary | ICD-10-CM

## 2020-06-29 DIAGNOSIS — I25118 Atherosclerotic heart disease of native coronary artery with other forms of angina pectoris: Secondary | ICD-10-CM | POA: Diagnosis not present

## 2020-06-29 DIAGNOSIS — J432 Centrilobular emphysema: Secondary | ICD-10-CM | POA: Diagnosis not present

## 2020-06-29 DIAGNOSIS — I272 Pulmonary hypertension, unspecified: Secondary | ICD-10-CM

## 2020-06-29 DIAGNOSIS — E782 Mixed hyperlipidemia: Secondary | ICD-10-CM

## 2020-06-29 DIAGNOSIS — N401 Enlarged prostate with lower urinary tract symptoms: Secondary | ICD-10-CM | POA: Diagnosis not present

## 2020-06-29 NOTE — Chronic Care Management (AMB) (Signed)
Chronic Care Management Pharmacy Note  06/29/2020 Name:  Matthew Miles. MRN:  [id number] DOB:  [birth year]  Subjective: Matthew Miles. is an 87 y.o. year old male who is a primary patient of Sonnenberg, [name], MD.  The CCM team was consulted for assistance with disease management and care coordination needs.    Engaged with patient by telephone for medication management question follow up in response to provider referral for pharmacy case management and/or care coordination services. Also spoke with daughter, [name], as she was with the patient at his home.   Consent to Services:  The patient was given information about Chronic Care Management services, agreed to services, and gave verbal consent prior to initiation of services.  Please see initial visit note for detailed documentation.   Patient Care Team: [name], MD as PCP - General (Family Medicine) Rockey Situ Kathlene November, MD as PCP - Cardiology (Cardiology) Minna Merritts, MD as Consulting Physician (Cardiology) De Hollingshead, RPH-CPP as Pharmacist (Pharmacist)  Recent office visits: None since our last visit   Recent consult visits: None since our last visit   Hospital visits: See last CCM note  Objective:  Lab Results  Component Value Date   CREATININE 1.37 (H) 05/29/2020   CREATININE 1.01 02/19/2020   CREATININE 1.27 12/30/2019    Lab Results  Component Value Date   HGBA1C 6.8 (H) 12/30/2019   Last diabetic Eye exam:  Lab Results  Component Value Date/Time   HMDIABEYEEXA Retinopathy (A) 06/12/2020 12:00 AM    Last diabetic Foot exam:  Lab Results  Component Value Date/Time   HMDIABFOOTEX Dr. Milinda Pointer 12/18/2013 12:00 AM        Component Value Date/Time   CHOL 104 03/16/2018 1354   TRIG 118.0 03/16/2018 1354   HDL 28.50 (L) 03/16/2018 1354   CHOLHDL 4 03/16/2018 1354   VLDL 23.6 03/16/2018 1354   LDLCALC 51 03/16/2018 1354   LDLDIRECT 80.0 08/05/2015 1405    Hepatic  Function Latest Ref Rng & Units 05/29/2020 12/30/2019 10/29/2018  Total Protein 6.5 - 8.1 g/dL 6.8 6.2 5.9(L)  Albumin 3.5 - 5.0 g/dL 3.9 4.1 4.1  AST 15 - 41 U/L 19 13 14   ALT 0 - 44 U/L 14 12 11   Alk Phosphatase 38 - 126 U/L 73 93 79  Total Bilirubin 0.3 - 1.2 mg/dL 0.7 0.5 0.4  Bilirubin, Direct 0.0 - 0.2 mg/dL 0.1 - -    Lab Results  Component Value Date/Time   TSH 4.92 (H) 12/30/2019 02:16 PM   TSH 3.20 10/31/2013 10:51 AM    CBC Latest Ref Rng & Units 05/29/2020 12/30/2019 01/16/2019  WBC 4.0 - 10.5 K/uL 3.8(L) 4.1 5.3  Hemoglobin 13.0 - 17.0 g/dL 9.0(L) 11.2(L) 11.2(L)  Hematocrit 39.0 - 52.0 % 28.3(L) 33.9(L) 34.7(L)  Platelets 150 - 400 K/uL 74(L) 83.0(L) 100.0(L)    Clinical ASCVD: Yes    Social History   Tobacco Use  Smoking Status Former Smoker  . Packs/day: 1.00  . Years: 65.00  . Pack years: 65.00  . Types: Cigarettes  . Quit date: 04/10/2011  . Years since quitting: 9.2  Smokeless Tobacco Former Systems developer  . Types: Chew   BP Readings from Last 3 Encounters:  05/29/20 120/62  05/12/20 (!) 122/50  04/01/20 118/60   Pulse Readings from Last 3 Encounters:  05/29/20 90  05/12/20 81  04/01/20 91   Wt Readings from Last 3 Encounters:  06/26/20 159 lb (72.1 kg)  06/25/20 159 lb (  72.1 kg)  05/29/20 166 lb (75.3 kg)    Assessment: Review of patient past medical history, allergies, medications, health status, including review of consultants reports, laboratory and other test data, was performed as part of comprehensive evaluation and provision of chronic care management services.   SDOH:  (Social Determinants of Health) assessments and interventions performed: none today, see last CCM note   CCM Care Plan  Allergies  Allergen Reactions  . Oxybutynin     Dry mouth and constant urination.  . Sulfa Antibiotics     GI upset  . Tradjenta [Linagliptin] Other (See Comments)    Hair loss    Medications Reviewed Today    Reviewed by Dia Crawford,  LPN (Licensed Practical Nurse) on 06/26/20 at 4  Med List Status: <None>  Medication Order Taking? Sig Documenting Provider Last Dose Status Informant  ACCU-CHEK GUIDE test strip [id number]  TEST BLOOD SUGAR TWICE DAILY AS DIRECTED [name], MD  Active   alfuzosin (UROXATRAL) 10 MG 24 hr tablet [id number] No Take 1 tablet (10 mg total) by mouth daily with breakfast.  Patient not taking: Reported on 06/25/2020   [name], MD Not Taking Active   aspirin 81 MG tablet [id number] No Take 81 mg by mouth daily.  [provider] Taking Active Self  CALCIUM PO [id number] No Take 1,200 mg by mouth daily. + 1000 units Vit D [provider] Taking Active   carvedilol (COREG) 6.25 MG tablet [id number] No TAKE 1 TABLET(6.25 MG) BY MOUTH TWICE DAILY [name], MD Taking Active   cholecalciferol (VITAMIN D3) 25 MCG (1000 UNIT) tablet [id number] No Take 1,000 Units by mouth daily. [provider] Taking Active   clotrimazole (LOTRIMIN) 1 % cream [id number] No Apply 1 application topically 2 (two) times daily. [name], MD Taking Active   Cyanocobalamin (B-12) 1000 MCG TBCR [id number] No Take 1 tablet by mouth daily.  [provider] Taking Active Self  Dulaglutide (TRULICITY) 1.5 LS/9.3TD SOPN [id number] No Inject 1.5 mg into the skin once a week. [name], MD Taking Active   FEROSUL 325 [phone]) MG tablet [id number] No TAKE 1 TABLET TWICE DAILY WITH MEALS [name], MD Taking Active            Med Note Nat Christen Jun 25, 2020 11:19 AM) Taking once daily  Lancets [facility] ULTRASOFT) lancets [id number] No Use as instructed [name], MD Taking Active   metFORMIN (GLUCOPHAGE-XR) 500 MG 24 hr tablet [id number] No TAKE 2 TABLETS(1000 MG) BY MOUTH TWICE DAILY [name], MD Taking Active   nitroGLYCERIN (NITROSTAT) 0.4 MG SL tablet [id number] No Place 1 tablet (0.4 mg total) under the tongue every 5 (five)  minutes as needed. [name], MD Taking Active Self  pantoprazole (PROTONIX) 20 MG tablet [id number] No Take 1 tablet (20 mg total) by mouth 2 (two) times daily for 14 days. Duffy Bruce, MD Taking Expired 06/12/20 2359            Med Note Darnelle Maffucci, Maralyn Sago Jun 25, 2020 11:21 AM) Taking once daily  potassium chloride (KLOR-CON) 10 MEQ tablet [id number] No TAKE 1 TABLET (10 MEQ) BY MOUTH DAILY [name], MD Taking Active   pravastatin (PRAVACHOL) 40 MG tablet [id number] No TAKE 1 TABLET EVERY DAY [name], MD Taking Active   PROAIR HFA 108 [phone] Base) MCG/ACT inhaler [id number] No INHALE 2 PUFFS INTO  THE LUNGS EVERY 6 HOURS AS NEEDED FOR WHEEZING OR SHORTNESS OF BREATH  Patient not taking: Reported on 06/25/2020   [name], MD Not Taking Active   Tiotropium Bromide Monohydrate (SPIRIVA RESPIMAT) 2.5 MCG/ACT AERS [id number] No Inhale 2 puffs into the lungs daily. [name], MD Taking Active   torsemide (DEMADEX) 10 MG tablet [id number] No TAKE 1 TABLET(10 MG) BY MOUTH TWICE DAILY AS NEEDED [name], MD Taking Active            Med Note Nat Christen Jun 25, 2020 11:23 AM) Taking 2 QAM  Zinc Oxide 13 % CREA [id number] No Apply as needed to the wound on your left buttocks [name], MD Taking Active           Patient Active Problem List   Diagnosis Date Noted  . COVID-19 04/06/2020  . PAD (peripheral artery disease) ([place]) 01/09/2020  . Chronic venous insufficiency 01/09/2020  . Elevated TSH 01/01/2020  . Decreased pedal pulses 01/01/2020  . Lower urinary tract symptoms (LUTS) 06/28/2019  . Dysuria 03/18/2019  . Pressure ulcer 01/16/2019  . Diabetic neuropathy ([place]) 12/31/2018  . Diabetic retinopathy ([place]) 10/07/2018  . Milk intolerance 07/18/2018  . Monoclonal B-cell lymphocytosis 07/18/2018  . Prostate cancer ([place]) 07/04/2018  . BPH (benign prostatic hyperplasia) 02/10/2017  . Left leg pain 10/13/2016  .  Falls 08/05/2016  . Chest pain 07/30/2016  . Hip pain, right 02/03/2016  . Actinic keratoses 12/24/2015  . Osteoarthritis of both hands 12/24/2015  . Obesity (BMI 30-39.9) 10/21/2014  . Essential hypertension 07/21/2014  . Murmur, cardiac 10/15/2013  . Chronic low back pain 10/08/2013  . Chronic diastolic CHF (congestive heart failure) ([place]) 04/15/2013  . Thrombocytopenia ([place]) 08/21/2012  . Urge incontinence 07/02/2012  . Hyperlipidemia 06/08/2012  . H/O malignant neoplasm of prostate 06/04/2012  . Diabetes ([place]) 04/26/2012  . Iron deficiency anemia 01/06/2012  . Pulmonary hypertension ([place]) 11/25/2011  . GERD (gastroesophageal reflux disease) 06/13/2011  . Coronary artery disease of native artery of native heart with stable angina pectoris ([place]) 04/26/2011  . Tachycardia 04/26/2011  . COPD (chronic obstructive pulmonary disease) ([place])     Immunization History  Administered Date(s) Administered  . Fluad Quad(high Dose 65+) 01/01/2020  . Influenza Split 12/29/2011  . Influenza, High Dose Seasonal PF 12/24/2015, 01/14/2017, 01/31/2018, 01/05/2019  . Influenza,inj,Quad PF,6+ Mos 01/10/2013, 01/17/2014, 01/22/2015  . Influenza-Unspecified 12/20/2011, 01/10/2013, 01/17/2014, 01/22/2015, 01/05/2019  . PFIZER(Purple Top)SARS-COV-2 Vaccination 05/21/2019, 06/11/2019, 11/19/2019, 01/20/2020  . Pneumococcal Conjugate-13 04/21/2014  . Pneumococcal Polysaccharide-23 12/29/2011  . Tdap 04/07/2011    Conditions to be addressed/monitored: CAD, HTN, HLD and DMII  Care Plan : Medication Management  Updates made by De Hollingshead, RPH-CPP since 06/29/2020 12:00 AM    Problem: Diabetes, HTN, Prostate Ca     Long-Range Goal: Disease Progression Prevention   Start Date: 05/07/2020  Recent Progress: On track  Priority: High  Note:   Current Barriers:  . Complex patient with multiple comorbidities increasing risk for hospitalization  Pharmacist Clinical Goal(s):  Marland Kitchen Over the next 90  days, patient will achieve adherence to monitoring guidelines and medication adherence to achieve therapeutic efficacy through collaboration with PharmD and provider.   Interventions: . 1:1 collaboration with [name], MD regarding development and update of comprehensive plan of care as evidenced by provider attestation and co-signature . Inter-disciplinary care team collaboration (see longitudinal plan of care) . Comprehensive medication review performed; medication list updated in  electronic medical record  Diabetes: . Controlled; current treatment: metformin 1937 mg BID, Trulicity 1.5 mg weekly o Hx Jardiance - d/t d/t hypotension  . Current glucose readings: fasting glucose: this morning was 122, all readings <130, post prandial glucose: not checking recently . Denies hypoglycemic symptoms . Current meal patterns: notes he "eats what he wants" but in moderation. ~ 20 lbs weight loss since initiation of Trulicity. Notes he likes occasional bread and butter pickles. Getting salads from [facility]. Fixing vegetables.  . 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  . Previously recommended to continue current regimen at this time  Hypertension/HFpEF . Appropriately managed; current treatment: carvedilol 6.25 mg BID, torsemide 20 mg (takes 2 10 mg QAM); follows w/ [name] . Current home readings: reports BP readings around 130/60s with his new arm cuff, but reports his daughter has checked readings with her cuff and readings more in the 110s/50s . Discussed moderation in sodium intake. Rinses canned vegetables before cooking.  . Previously recommended to continue periodic monitoring of home BP, along with collaboration with cardiology. Continue current regimen at this time  Hyperlipidemia: . Controlled per last lipid panel; current treatment: pravastatin 40 mg daily   . Antiplatelet regimen: aspirin 81 mg daily . Previously recommended to continue current regimen  at this time  Chronic Obstructive Pulmonary Disease: . Controlled; current treatment: Spiriva Respimat 2.5 mcg 2 puffs daily; albuterol HFA PRN -reports little need for albuterol since recovery from COVID . GOLD Classification: B . Previously recommended to  continue current regimen at this time  GERD: . Uncontrolled; s/p ED visit where he was treated w/ 2 weeks of PPI + 1 week of carafate. Continues to take pantoprazole 20 mg daily, but not necessarily ~ 30 minutes before breakfast. He and [name] endorses more acid reflux symptoms at night.  . Discussed w/ PCP. Encouraged patient to focus on taking ~ 30 minutes before a meal to offer greatest benefit. Suggested moving to pre-supper administration if bedtime acid reflux is more troublesome. Avoid trigger foods before bed, avoid eating too late and laying down too soon after eating. [name] was with the patient and relayed this message. They will try this between now and next PCP f/u.    Hx Prostate Cancer: . PSA increased on last check. F/u with [facility] in ~ 3 months, consideration of bone scan. Still holding Lupron therapy.  . Current treatment: alfuzosin 10 mg daily . Previously recommended to continue current regimen along with collaboration with urology.   Splenic Marginal Zone B-Cell Lymphoma: . Follows with [facility]. S/p 3 doses rituximab + Evusheld . Continue collaboration with oncology. Patient notes he is feeling good today. Denies nausea, lasting fatigue.   Patient Goals/Self-Care Activities . Over the next 90 days, patient will:  - take medications as prescribed check glucose daily, document, and provide at future appointments check blood pressure periodically, document, and provide at future appointments  Follow Up Plan: Telephone follow up appointment with care management team member scheduled for: ~ 12 weeks as previously scheduled       Medication Assistance: None required.  Patient affirms current coverage  meets needs.  Patient's preferred pharmacy is:  [id number] [address], [address] [place] [address] Phone: [phone] Fax: [phone]  [facility] [address], [address] [place] [address]  Phone: [phone] Fax: [phone]  Uses pill box? Yes Pt endorses 100% compliance  Follow Up:  Patient agrees to Care Plan and Follow-up.  Plan: Telephone follow up appointment with care management team member scheduled for:  ~ 12 weeks  Catie Darnelle Maffucci, PharmD, [place], [place] Clinical Pharmacist [employer] at [employer] [phone]

## 2020-06-29 NOTE — Patient Instructions (Signed)
Visit Information  PATIENT GOALS: Goals Addressed              This Visit's Progress     Patient Stated   .  Medication Monitoring (pt-stated)        Patient Goals/Self-Care Activities . Over the next 90 days, patient will:  - take medications as prescribed check glucose daily, document, and provide at future appointments check blood pressure periodically, document, and provide at future appointments         The patient verbalized understanding of instructions, educational materials, and care plan provided today and declined offer to receive copy of patient instructions, educational materials, and care plan.   Plan: Telephone follow up appointment with care management team member scheduled for:  ~ 12 weeks  Catie Darnelle Maffucci, PharmD, [place], [place] Clinical Pharmacist [employer] [facility] [employer] [phone]

## 2020-07-07 ENCOUNTER — Other Ambulatory Visit: Payer: Self-pay | Admitting: Family Medicine

## 2020-07-14 ENCOUNTER — Other Ambulatory Visit: Payer: Self-pay | Admitting: Family Medicine

## 2020-07-28 ENCOUNTER — Other Ambulatory Visit: Payer: Self-pay | Admitting: *Deleted

## 2020-07-28 DIAGNOSIS — I2510 Atherosclerotic heart disease of native coronary artery without angina pectoris: Secondary | ICD-10-CM

## 2020-07-28 MED ORDER — NITROGLYCERIN 0.4 MG SL SUBL: 0.4000 mg | SUBLINGUAL_TABLET | SUBLINGUAL | 0 refills | Status: DC | PRN

## 2020-07-29 DIAGNOSIS — R972 Elevated prostate specific antigen [PSA]: Secondary | ICD-10-CM | POA: Diagnosis not present

## 2020-07-29 DIAGNOSIS — Z5112 Encounter for antineoplastic immunotherapy: Secondary | ICD-10-CM | POA: Diagnosis not present

## 2020-07-29 DIAGNOSIS — C61 Malignant neoplasm of prostate: Secondary | ICD-10-CM | POA: Diagnosis not present

## 2020-07-29 DIAGNOSIS — B029 Zoster without complications: Secondary | ICD-10-CM | POA: Diagnosis not present

## 2020-07-29 DIAGNOSIS — C8307 Small cell B-cell lymphoma, spleen: Secondary | ICD-10-CM | POA: Diagnosis not present

## 2020-07-30 ENCOUNTER — Other Ambulatory Visit: Payer: Self-pay

## 2020-07-30 DIAGNOSIS — Z8546 Personal history of malignant neoplasm of prostate: Secondary | ICD-10-CM | POA: Diagnosis not present

## 2020-07-30 DIAGNOSIS — Z08 Encounter for follow-up examination after completed treatment for malignant neoplasm: Secondary | ICD-10-CM | POA: Diagnosis not present

## 2020-07-30 DIAGNOSIS — R972 Elevated prostate specific antigen [PSA]: Secondary | ICD-10-CM | POA: Diagnosis not present

## 2020-07-30 DIAGNOSIS — C8307 Small cell B-cell lymphoma, spleen: Secondary | ICD-10-CM | POA: Diagnosis not present

## 2020-07-30 DIAGNOSIS — Z8572 Personal history of non-Hodgkin lymphomas: Secondary | ICD-10-CM | POA: Diagnosis not present

## 2020-07-30 DIAGNOSIS — Z79818 Long term (current) use of other agents affecting estrogen receptors and estrogen levels: Secondary | ICD-10-CM | POA: Diagnosis not present

## 2020-07-30 DIAGNOSIS — R918 Other nonspecific abnormal finding of lung field: Secondary | ICD-10-CM | POA: Diagnosis not present

## 2020-07-30 DIAGNOSIS — R591 Generalized enlarged lymph nodes: Secondary | ICD-10-CM | POA: Diagnosis not present

## 2020-07-30 DIAGNOSIS — Z79899 Other long term (current) drug therapy: Secondary | ICD-10-CM | POA: Diagnosis not present

## 2020-07-30 DIAGNOSIS — C61 Malignant neoplasm of prostate: Secondary | ICD-10-CM | POA: Diagnosis not present

## 2020-07-30 DIAGNOSIS — R32 Unspecified urinary incontinence: Secondary | ICD-10-CM | POA: Diagnosis not present

## 2020-07-30 DIAGNOSIS — R590 Localized enlarged lymph nodes: Secondary | ICD-10-CM | POA: Diagnosis not present

## 2020-07-30 DIAGNOSIS — R161 Splenomegaly, not elsewhere classified: Secondary | ICD-10-CM | POA: Diagnosis not present

## 2020-07-30 DIAGNOSIS — I2510 Atherosclerotic heart disease of native coronary artery without angina pectoris: Secondary | ICD-10-CM

## 2020-07-30 DIAGNOSIS — B029 Zoster without complications: Secondary | ICD-10-CM | POA: Diagnosis not present

## 2020-07-30 DIAGNOSIS — E119 Type 2 diabetes mellitus without complications: Secondary | ICD-10-CM

## 2020-07-30 MED ORDER — ONETOUCH ULTRASOFT LANCETS MISC: 12 refills | Status: DC

## 2020-07-30 MED ORDER — NITROGLYCERIN 0.4 MG SL SUBL: 0.4000 mg | SUBLINGUAL_TABLET | SUBLINGUAL | 0 refills | Status: DC | PRN

## 2020-07-31 ENCOUNTER — Encounter: Payer: Self-pay | Admitting: Family Medicine

## 2020-07-31 ENCOUNTER — Other Ambulatory Visit: Payer: Self-pay

## 2020-07-31 ENCOUNTER — Telehealth (INDEPENDENT_AMBULATORY_CARE_PROVIDER_SITE_OTHER): Payer: Medicare HMO | Admitting: Family Medicine

## 2020-07-31 VITALS — Ht 63.0 in | Wt 170.0 lb

## 2020-07-31 DIAGNOSIS — E538 Deficiency of other specified B group vitamins: Secondary | ICD-10-CM

## 2020-07-31 DIAGNOSIS — E119 Type 2 diabetes mellitus without complications: Secondary | ICD-10-CM | POA: Diagnosis not present

## 2020-07-31 DIAGNOSIS — Z8616 Personal history of COVID-19: Secondary | ICD-10-CM | POA: Diagnosis not present

## 2020-07-31 DIAGNOSIS — B029 Zoster without complications: Secondary | ICD-10-CM | POA: Diagnosis not present

## 2020-07-31 DIAGNOSIS — E782 Mixed hyperlipidemia: Secondary | ICD-10-CM | POA: Diagnosis not present

## 2020-07-31 NOTE — Progress Notes (Signed)
Virtual Visit via telephone Note  This visit type was conducted due to national recommendations for restrictions regarding the COVID-19 pandemic (e.g. social distancing).  This format is felt to be most appropriate for this patient at this time.  All issues noted in this document were discussed and addressed.  No physical exam was performed (except for noted visual exam findings with Video Visits).   I connected with Matthew Miles today at  1:15 PM EDT by telephone and verified that I am speaking with the correct person using two identifiers. Location patient: car Location provider: work Persons participating in the virtual visit: patient, provider, [name] (daughter)  I discussed the limitations, risks, security and privacy concerns of performing an evaluation and management service by telephone and the availability of in person appointments. I also discussed with the patient that there may be a patient responsible charge related to this service. The patient expressed understanding and agreed to proceed.  Interactive audio and video telecommunications were attempted between this provider and patient, however failed, due to patient having technical difficulties OR patient did not have access to video capability.  We continued and completed visit with audio only.   Reason for visit: f/u.  HPI: DIABETES Disease Monitoring: Blood Sugar ranges-116-130 Polyuria/phagia/dipsia- no      Optho- UTD Medications: Compliance- taking jardiance, metformin Hypoglycemic symptoms- no  HYPERLIPIDEMIA Symptoms Chest pain on exertion:  no   Medications: Compliance- taking pravastatin Right upper quadrant pain- no  Muscle aches- no  History of COVID-19: Patient reports he is fully recovered.  Symptoms only lasted 4 days.  Shingles: Diagnosed 2 days ago by his oncology team.  He had 3 spots on the left posterior neck behind his left ear.  He notes there is tenderness and pain though those are getting  better after starting the Valtrex.  His daughter notes they have started to crust and scab.  They are unclear on the Valtrex prescription instructions after the first 7 days.  History of B12 deficiency: The patient's daughter reports he has been taking a B12 supplement twice daily.  She wonders when the last time this was checked.  ROS: See pertinent positives and negatives per HPI.  Past Medical History:  Diagnosis Date  . Allergy   . Arthritis   . CHF (congestive heart failure) ([place])   . Chicken pox   . Cholecystitis   . Colon polyps   . COPD (chronic obstructive pulmonary disease) ([place])   . Coronary artery disease   . Diabetes mellitus without complication ([place])   . Emphysema of lung ([place])   . GERD (gastroesophageal reflux disease)   . Heart murmur   . Hematemesis/vomiting blood 04/10/11  . Hypercholesterolemia   . Mild hypertension   . Pancreatitis   . Prostate cancer ([place])   . Prostate cancer [facility])    radiation therapy   . Smoker   . Ulcer   . Upper GI bleed 1980    Past Surgical History:  Procedure Laterality Date  . APPENDECTOMY    . CARDIAC CATHETERIZATION  May 2002 and Feb 2013   [facility]; no stents   . CATARACT EXTRACTION    . CHOLECYSTECTOMY    . CIRCUMCISION    . COLONOSCOPY    . HEMORRHOID SURGERY    . SP CHOLECYSTOMY    . STOMACH SURGERY     bleeding ulcers, followed by Dr. [name]    Family History  Problem Relation Age of Onset  . Prostate cancer Father   .  Liver cancer Brother   . Prostate cancer Brother   . Emphysema Sister        smoker    SOCIAL HX: Former smoker   Current Outpatient Medications:  .  ACCU-CHEK GUIDE test strip, TEST BLOOD SUGAR TWICE DAILY AS DIRECTED, Disp: 200 strip, Rfl: 2 .  alfuzosin (UROXATRAL) 10 MG 24 hr tablet, Take 1 tablet (10 mg total) by mouth daily with breakfast., Disp: 90 tablet, Rfl: 0 .  aspirin 81 MG tablet, Take 81 mg by mouth daily. , Disp: , Rfl:  .  CALCIUM PO, Take 1,200 mg by mouth daily. + 1000  units Vit D, Disp: , Rfl:  .  carvedilol (COREG) 6.25 MG tablet, TAKE 1 TABLET(6.25 MG) BY MOUTH TWICE DAILY, Disp: 180 tablet, Rfl: 2 .  cholecalciferol (VITAMIN D3) 25 MCG (1000 UNIT) tablet, Take 1,000 Units by mouth daily., Disp: , Rfl:  .  clotrimazole (LOTRIMIN) 1 % cream, Apply 1 application topically 2 (two) times daily., Disp: 30 g, Rfl: 0 .  Cyanocobalamin (B-12) 1000 MCG TBCR, Take 1 tablet by mouth daily. , Disp: , Rfl:  .  FEROSUL 325 (65 Fe) MG tablet, TAKE 1 TABLET TWICE DAILY WITH MEALS, Disp: 180 tablet, Rfl: 1 .  JARDIANCE 10 MG TABS tablet, , Disp: , Rfl:  .  Lancets (ONETOUCH ULTRASOFT) lancets, Use as instructed, Disp: 100 each, Rfl: 12 .  metFORMIN (GLUCOPHAGE-XR) 500 MG 24 hr tablet, TAKE 2 TABLETS(1000 MG) BY MOUTH TWICE DAILY, Disp: 360 tablet, Rfl: 3 .  nitroGLYCERIN (NITROSTAT) 0.4 MG SL tablet, Place 1 tablet (0.4 mg total) under the tongue every 5 (five) minutes as needed., Disp: 25 tablet, Rfl: 0 .  pantoprazole (PROTONIX) 20 MG tablet, TAKE 1 TABLET EVERY DAY, Disp: 90 tablet, Rfl: 1 .  potassium chloride (KLOR-CON) 10 MEQ tablet, TAKE 1 TABLET (10 MEQ) BY MOUTH DAILY, Disp: 90 tablet, Rfl: 1 .  pravastatin (PRAVACHOL) 40 MG tablet, TAKE 1 TABLET EVERY DAY, Disp: 90 tablet, Rfl: 1 .  PROAIR HFA 108 (90 Base) MCG/ACT inhaler, INHALE 2 PUFFS INTO THE LUNGS EVERY 6 HOURS AS NEEDED FOR WHEEZING OR SHORTNESS OF BREATH, Disp: 8.5 g, Rfl: 0 .  Tiotropium Bromide Monohydrate (SPIRIVA RESPIMAT) 2.5 MCG/ACT AERS, Inhale 2 puffs into the lungs daily., Disp: 4 g, Rfl: 5 .  torsemide (DEMADEX) 10 MG tablet, TAKE 1 TABLET(10 MG) BY MOUTH TWICE DAILY AS NEEDED, Disp: 180 tablet, Rfl: 3 .  Zinc Oxide 13 % CREA, Apply as needed to the wound on your left buttocks, Disp: 113 g, Rfl: 1  EXAM: This was a telephone visit and thus no physical exam was completed.  ASSESSMENT AND PLAN:  Discussed the following assessment and plan:  Problem List Items Addressed This Visit    Diabetes  ([place]) - Primary    Seems to be well controlled.  We will have him come in for labs.  Continue Jardiance 10 mg once daily and metformin XR 1000 mg twice daily.      Relevant Orders   Hemoglobin A1c   Hyperlipidemia    Continue pravastatin 40 mg once daily.      History of COVID-19    The patient has recovered quite well.  He will monitor.      Shingles    Seems to be improving after just a couple of days of Valtrex.  I advised that they monitor.  Given that the lesions are crusting he does not need to keep the area covered.  Discussed to be developed new lesions they would need to keep the area covered as much as they are able to.  Discussed that the purpose of Valtrex is to aid in the improvement and also minimize risk of long-term sequela.  I relayed the Valtrex instructions as written in the AVS from his oncologist though his daughter noted that they would contact the oncology office to confirm these as well.  The patient should be taking the Valtrex 1000 mg 3 times daily for 7 days and then Valtrex 500 mg daily indefinitely per the oncologist's note.  I also discussed that if he develops any pain in his ear or any lesions around his eyes he Miles to let us know immediately.      B12 deficiency    Check B12 with lab work.      Relevant Orders   B12       I discussed the assessment and treatment plan with the patient. The patient was provided an opportunity to ask questions and all were answered. The patient agreed with the plan and demonstrated an understanding of the instructions.   The patient was advised to call back or seek an in-person evaluation if the symptoms worsen or if the condition fails to improve as anticipated.  I provided 13 minutes of non-face-to-face time during this encounter.   [name], MD

## 2020-07-31 NOTE — Assessment & Plan Note (Addendum)
Seems to be improving after just a couple of days of Valtrex.  I advised that they monitor.  Given that the lesions are crusting he does not need to keep the area covered.  Discussed to be developed new lesions they would need to keep the area covered as much as they are able to.  Discussed that the purpose of Valtrex is to aid in the improvement and also minimize risk of long-term sequela.  I relayed the Valtrex instructions as written in the AVS from his oncologist though his daughter noted that they would contact the oncology office to confirm these as well.  The patient should be taking the Valtrex 1000 mg 3 times daily for 7 days and then Valtrex 500 mg daily indefinitely per the oncologist's note.  I also discussed that if he develops any pain in his ear or any lesions around his eyes he needs to let us know immediately.

## 2020-07-31 NOTE — Assessment & Plan Note (Signed)
The patient has recovered quite well.  He will monitor.

## 2020-07-31 NOTE — Assessment & Plan Note (Signed)
Continue pravastatin 40 mg once daily.

## 2020-07-31 NOTE — Assessment & Plan Note (Signed)
Seems to be well controlled.  We will have him come in for labs.  Continue Jardiance 10 mg once daily and metformin XR 1000 mg twice daily.

## 2020-07-31 NOTE — Assessment & Plan Note (Signed)
Check B12 with lab work. 

## 2020-09-17 ENCOUNTER — Ambulatory Visit (INDEPENDENT_AMBULATORY_CARE_PROVIDER_SITE_OTHER): Payer: Medicare HMO | Admitting: Pharmacist

## 2020-09-17 DIAGNOSIS — I5032 Chronic diastolic (congestive) heart failure: Secondary | ICD-10-CM | POA: Diagnosis not present

## 2020-09-17 DIAGNOSIS — E119 Type 2 diabetes mellitus without complications: Secondary | ICD-10-CM

## 2020-09-17 DIAGNOSIS — J449 Chronic obstructive pulmonary disease, unspecified: Secondary | ICD-10-CM | POA: Diagnosis not present

## 2020-09-17 DIAGNOSIS — N401 Enlarged prostate with lower urinary tract symptoms: Secondary | ICD-10-CM

## 2020-09-17 DIAGNOSIS — I25118 Atherosclerotic heart disease of native coronary artery with other forms of angina pectoris: Secondary | ICD-10-CM | POA: Diagnosis not present

## 2020-09-17 DIAGNOSIS — I739 Peripheral vascular disease, unspecified: Secondary | ICD-10-CM

## 2020-09-17 DIAGNOSIS — J432 Centrilobular emphysema: Secondary | ICD-10-CM | POA: Diagnosis not present

## 2020-09-17 DIAGNOSIS — E782 Mixed hyperlipidemia: Secondary | ICD-10-CM | POA: Diagnosis not present

## 2020-09-17 NOTE — Chronic Care Management (AMB) (Signed)
Chronic Care Management Pharmacy Note  09/17/2020 Name:  Matthew Miles. MRN:  [id number] DOB:  1929/01/08   Subjective: Matthew Blank. is an 87 y.o. year old male who is a primary patient of Sonnenberg, [name], MD.  The CCM team was consulted for assistance with disease management and care coordination needs.    Engaged with patient by telephone for follow up visit in response to provider referral for pharmacy case management and/or care coordination services. Also spoke with his daughter, Matthew Miles (on [place])  Consent to Services:  The patient was given information about Chronic Care Management services, agreed to services, and gave verbal consent prior to initiation of services.  Please see initial visit note for detailed documentation.   Patient Care Team: [name], MD as PCP - General (Family Medicine) Rockey Situ Kathlene November, MD as PCP - Cardiology (Cardiology) Minna Merritts, MD as Consulting Physician (Cardiology) De Hollingshead, RPH-CPP as Pharmacist (Pharmacist)  Recent office visits: 4/22- PCP- continue current regimen, needs to come in for labs   Recent consult visits: 4/20- rituxan for marginal zone lymphoma 4/20 - hem/onc Caprice Beaver - valacyclovir 1000 mg TID x 7 days then 500 mg daily, mupirocin for lesions 4/21- [facility] Dr. Aline Brochure;  CT chest/ abdomen/pelvis rising PSA - overall no change; whole body bone scan - no evidence of osseous metastatic disease   Hospital visits: None in previous 6 months  Objective:  Lab Results  Component Value Date   CREATININE 1.37 (H) 05/29/2020   CREATININE 1.01 02/19/2020   CREATININE 1.27 12/30/2019    Lab Results  Component Value Date   HGBA1C 6.8 (H) 12/30/2019   Last diabetic Eye exam:  Lab Results  Component Value Date/Time   HMDIABEYEEXA Retinopathy (A) 06/12/2020 12:00 AM    Last diabetic Foot exam:  Lab Results  Component Value Date/Time   HMDIABFOOTEX Dr. Milinda Pointer 12/18/2013 12:00 AM         Component Value Date/Time   CHOL 104 03/16/2018 1354   TRIG 118.0 03/16/2018 1354   HDL 28.50 (L) 03/16/2018 1354   CHOLHDL 4 03/16/2018 1354   VLDL 23.6 03/16/2018 1354   LDLCALC 51 03/16/2018 1354   LDLDIRECT 80.0 08/05/2015 1405    Hepatic Function Latest Ref Rng & Units 05/29/2020 12/30/2019 10/29/2018  Total Protein 6.5 - 8.1 g/dL 6.8 6.2 5.9(L)  Albumin 3.5 - 5.0 g/dL 3.9 4.1 4.1  AST 15 - 41 U/L 19 13 14   ALT 0 - 44 U/L 14 12 11   Alk Phosphatase 38 - 126 U/L 73 93 79  Total Bilirubin 0.3 - 1.2 mg/dL 0.7 0.5 0.4  Bilirubin, Direct 0.0 - 0.2 mg/dL 0.1 - -    Lab Results  Component Value Date/Time   TSH 4.92 (H) 12/30/2019 02:16 PM   TSH 3.20 10/31/2013 10:51 AM    CBC Latest Ref Rng & Units 05/29/2020 12/30/2019 01/16/2019  WBC 4.0 - 10.5 K/uL 3.8(L) 4.1 5.3  Hemoglobin 13.0 - 17.0 g/dL 9.0(L) 11.2(L) 11.2(L)  Hematocrit 39.0 - 52.0 % 28.3(L) 33.9(L) 34.7(L)  Platelets 150 - 400 K/uL 74(L) 83.0(L) 100.0(L)   Clinical ASCVD: No  The ASCVD Risk score [name], et al., 2013) failed to calculate for the following reasons:   The 2013 ASCVD risk score is only valid for ages 53 to 6     Social History   Tobacco Use  Smoking Status Former   Packs/day: 1.00   Years: 65.00   Pack years:  65.00   Types: Cigarettes   Quit date: 04/10/2011   Years since quitting: 9.4  Smokeless Tobacco Former   Types: Chew   BP Readings from Last 3 Encounters:  05/29/20 120/62  05/12/20 (!) 122/50  04/01/20 118/60   Pulse Readings from Last 3 Encounters:  05/29/20 90  05/12/20 81  04/01/20 91   Wt Readings from Last 3 Encounters:  07/31/20 170 lb (77.1 kg)  06/26/20 159 lb (72.1 kg)  06/25/20 159 lb (72.1 kg)    Assessment: Review of patient past medical history, allergies, medications, health status, including review of consultants reports, laboratory and other test data, was performed as part of comprehensive evaluation and provision of chronic care management  services.   SDOH:  (Social Determinants of Health) assessments and interventions performed:  SDOH Interventions    Flowsheet Row Most Recent Value  SDOH Interventions   Financial Strain Interventions Intervention Not Indicated       CCM Care Plan  Allergies  Allergen Reactions   Oxybutynin     Dry mouth and constant urination.   Sulfa Antibiotics     GI upset   Tradjenta [Linagliptin] Other (See Comments)    Hair loss    Medications Reviewed Today     Reviewed by De Hollingshead, RPH-CPP (Pharmacist) on 09/17/20 at 0926  Med List Status: <None>   Medication Order Taking? Sig Documenting Provider Last Dose Status Informant  ACCU-CHEK GUIDE test strip [id number] Yes TEST BLOOD SUGAR TWICE DAILY AS DIRECTED [name], MD Taking Active   alfuzosin (UROXATRAL) 10 MG 24 hr tablet [id number] Yes Take 1 tablet (10 mg total) by mouth daily with breakfast. [name], MD Taking Active   aspirin 81 MG tablet [id number] Yes Take 81 mg by mouth daily.  [provider] Taking Active Self  CALCIUM PO [id number] Yes Take 1,200 mg by mouth daily. Calcium carbonate + 1000 units Vit D [provider] Taking Active   carvedilol (COREG) 6.25 MG tablet [id number] Yes TAKE 1 TABLET(6.25 MG) BY MOUTH TWICE DAILY [name], MD Taking Active   clotrimazole (LOTRIMIN) 1 % cream [id number]  Apply 1 application topically 2 (two) times daily. [name], MD  Active   Cyanocobalamin (B-12) 1000 MCG TBCR [id number] Yes Take 1 tablet by mouth daily.  [provider] Taking Active Self  Dulaglutide (TRULICITY) 1.5 TR/1.7BV SOPN [id number] Yes Inject 1.5 mg into the skin once a week. [provider] Taking Active   FEROSUL 325 (65 Fe) MG tablet [id number] Yes TAKE 1 TABLET TWICE DAILY WITH MEALS [name], MD Taking Active            Med Note Nat Christen Jun 25, 2020 11:19 AM) Taking once daily  Lancets [facility] ULTRASOFT)  lancets [id number]  Use as instructed [name], MD  Active   metFORMIN (GLUCOPHAGE-XR) 500 MG 24 hr tablet [id number] Yes TAKE 2 TABLETS(1000 MG) BY MOUTH TWICE DAILY [name], MD Taking Active   nitroGLYCERIN (NITROSTAT) 0.4 MG SL tablet [id number]  Place 1 tablet (0.4 mg total) under the tongue every 5 (five) minutes as needed. Minna Merritts, MD  Active   pantoprazole (PROTONIX) 20 MG tablet [id number] Yes TAKE 1 TABLET EVERY DAY [name], MD Taking Active   potassium chloride (KLOR-CON) 10 MEQ tablet [id number] Yes TAKE 1 TABLET (10 MEQ) BY MOUTH DAILY [name], MD Taking Active   pravastatin (PRAVACHOL) 40 MG tablet  [id number] Yes TAKE 1 TABLET EVERY DAY [name], MD Taking Active   PROAIR HFA [phone]) MCG/ACT inhaler [id number] Yes INHALE 2 PUFFS INTO THE LUNGS EVERY 6 HOURS AS NEEDED FOR WHEEZING OR SHORTNESS OF BREATH [name], MD Taking Active   Tiotropium Bromide Monohydrate (SPIRIVA RESPIMAT) 2.5 MCG/ACT AERS [id number] Yes Inhale 2 puffs into the lungs daily. [name], MD Taking Active   torsemide (DEMADEX) 10 MG tablet [id number] Yes TAKE 1 TABLET(10 MG) BY MOUTH TWICE DAILY AS NEEDED [name], MD Taking Active            Med Note Nat Christen Jun 25, 2020 11:23 AM) Taking 2 QAM  valACYclovir (VALTREX) 500 MG tablet [id number] Yes Take 500 mg by mouth 2 (two) times daily. [provider] Taking Active   Zinc Oxide 13 % CREA [id number] No Apply as needed to the wound on your left buttocks  Patient not taking: Reported on 09/17/2020   [name], MD Not Taking Active             Patient Active Problem List   Diagnosis Date Noted   History of COVID-19 07/31/2020   Shingles 07/31/2020   B12 deficiency 07/31/2020   COVID-19 04/06/2020   PAD (peripheral artery disease) ([place]) 01/09/2020   Chronic venous insufficiency 01/09/2020   Elevated TSH 01/01/2020   Decreased pedal  pulses 01/01/2020   Lower urinary tract symptoms (LUTS) 06/28/2019   Dysuria 03/18/2019   Pressure ulcer 01/16/2019   Diabetic neuropathy ([place]) 12/31/2018   Diabetic retinopathy ([place]) 10/07/2018   Milk intolerance 07/18/2018   Monoclonal B-cell lymphocytosis 07/18/2018   Prostate cancer ([place]) 07/04/2018   BPH (benign prostatic hyperplasia) 02/10/2017   Left leg pain 10/13/2016   Falls 08/05/2016   Chest pain 07/30/2016   Hip pain, right 02/03/2016   Actinic keratoses 12/24/2015   Osteoarthritis of both hands 12/24/2015   Obesity (BMI 30-39.9) 10/21/2014   Essential hypertension 07/21/2014   Murmur, cardiac 10/15/2013   Chronic low back pain 10/08/2013   Chronic diastolic CHF (congestive heart failure) ([place]) 04/15/2013   Thrombocytopenia ([place]) 08/21/2012   Urge incontinence 07/02/2012   Hyperlipidemia 06/08/2012   H/O malignant neoplasm of prostate 06/04/2012   Diabetes ([place]) 04/26/2012   Iron deficiency anemia 01/06/2012   Pulmonary hypertension ([place]) 11/25/2011   GERD (gastroesophageal reflux disease) 06/13/2011   Coronary artery disease of native artery of native heart with stable angina pectoris ([place]) 04/26/2011   Tachycardia 04/26/2011   COPD (chronic obstructive pulmonary disease) ([place])     Immunization History  Administered Date(s) Administered   Fluad Quad(high Dose 65+) 01/01/2020   Influenza Split 12/29/2011   Influenza, High Dose Seasonal PF 12/24/2015, 01/14/2017, 01/31/2018, 01/05/2019   Influenza,inj,Quad PF,6+ Mos 01/10/2013, 01/17/2014, 01/22/2015   Influenza-Unspecified 12/20/2011, 01/10/2013, 01/17/2014, 01/22/2015, 01/05/2019   PFIZER(Purple Top)SARS-COV-2 Vaccination 05/21/2019, 06/11/2019, 11/19/2019, 01/20/2020   Pneumococcal Conjugate-13 04/21/2014   Pneumococcal Polysaccharide-23 12/29/2011   Tdap 04/07/2011    Conditions to be addressed/monitored: HTN, HLD, and DMII  Care Plan : Medication Management  Updates made by De Hollingshead,  RPH-CPP since 09/17/2020 12:00 AM     Problem: Diabetes, HTN, Prostate Ca      Long-Range Goal: Disease Progression Prevention   Start Date: 05/07/2020  This Visit's Progress: On track  Recent Progress: On track  Priority: High  Note:   Current Barriers:  Complex patient with multiple comorbidities increasing risk for hospitalization  Pharmacist Clinical  Goal(s):  Over the next 90 days, patient will achieve adherence to monitoring guidelines and medication adherence to achieve therapeutic efficacy through collaboration with PharmD and provider.   Interventions: 1:1 collaboration with [name], MD regarding development and update of comprehensive plan of care as evidenced by provider attestation and co-signature Inter-disciplinary care team collaboration (see longitudinal plan of care) Comprehensive medication review performed; medication list updated in electronic medical record  Health Maintenance: Due for A1c. See below. Moving forward, evaluate appropriate timing for pneumococcal and second shingrix vaccination pending chemotherapy plan  Diabetes: Controlled per last A1c; current treatment: metformin XR 0998 mg BID, Trulicity 1.5 mg weekly Wonders if metformin is causing diarrhea.  Hx Jardiance - d/t d/t hypotension  Current glucose readings: fasting glucose: 120-140s, post prandial glucose: not checking recently Reports recent weight gain:  Denies hypoglycemic symptoms Current meal patterns: notes he "eats what he wants". Matthew Miles endorses higher volumes of strawberries, melons, tomatoes. 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  Recommended to continue current regimen at this time. Discussed need to get lab work to see A1c to guide how to reduce metformin to determine if relation to diarrhea. Assisted in scheduling w/ lab appointment.   Hypertension/HFpEF Appropriately managed; current treatment: carvedilol 6.25 mg BID, torsemide 20 mg (takes 2 10 mg QAM); follows w/ [name] Did not discuss home readings today.  Previously recommended to continue periodic monitoring of home BP, along with collaboration with cardiology. Continue current regimen at this time  Hyperlipidemia: Controlled per last lipid panel, but was in 2019; current treatment: pravastatin 40 mg daily   Antiplatelet regimen: aspirin 81 mg daily Lipid panel ordered for upcoming lab work  Chronic Obstructive Pulmonary Disease: Controlled per patient report; current treatment: Spiriva Respimat 2.5 mcg 2 puffs daily; albuterol HFA PRN -reports he has only needed when out in the yard working out GOLD Classification: B Recommended to continue current regimen at this time  GERD: Improved per patient report; current regimen: pantoprazole 20 mg QAM Continue current regimen at this time   Shingles (in setting of chemotherapy) Improving per patient report; current regimen: valacyclovir 500 mg BID - clarification needed on regimen. After visit summary from Dr. Caprice Beaver notes to take 500 mg daily indefinitely while on chemo while phone note from BP notes BID. Discussed with Matthew Miles. She will call [facility] to confirm what prevention dose they want him to remain on  Hx Prostate Cancer: PSA increased on last check. Recent visit with [facility] - no evidence of disease progression  Current treatment for BPH symptoms: alfuzosin 10 mg daily Recommended to continue current regimen along with collaboration with Urology Hem/Onc Supplements: calcium carbonate + vitamin d; discussed transition to calcium  citrate moving forward due to concurrent PPI  Splenic Marginal Zone B-Cell Lymphoma: Follows with [facility]. Current regimen: rituximab (received Evusheld) Continue collaboration with oncology.    Patient Goals/Self-Care Activities Over the next 90 days, patient will:  - take medications as prescribed check glucose daily, document, and provide at future appointments check blood pressure periodically, document, and provide at future appointments  Follow Up  Plan: Telephone follow up appointment with care management team member scheduled for: ~ 8 weeks      Medication Assistance: None required.  Patient affirms current coverage meets needs.  Patient's preferred pharmacy is:  [id number] [address], [address] [place] [address] Phone: [phone] Fax: [phone]  [facility] [address], [address] [place] [address] Phone: [phone] Fax: [phone]   Follow Up:  Patient agrees to Care Plan and Follow-up.  Plan: Telephone follow up appointment with care management team member scheduled for:  ~8 weeks  Catie Darnelle Maffucci, PharmD, [place], [place] Clinical Pharmacist [employer] [facility] [employer] [phone]

## 2020-09-17 NOTE — Patient Instructions (Signed)
It was great talking to you today!  Keep taking metformin 1000 mg twice daily and Trulicity 1.5 mg weekly for now. Once we see you lab work, we can talk about if we can reduce the dose of metformin to see if that helps with your diarrhea.   Instead of calcium carbonate, pick up a calcium citrate + vitamin D supplement moving forward. The citrate form is better absorbed when you are on a stomach medication like pantoprazole.   Clarify with [facility] about whether you should be taking the valacyclovir once daily or twice daily.   Take care!  Catie Darnelle Maffucci, PharmD [phone]  Visit Information  PATIENT GOALS:  Goals Addressed               This Visit's Progress     Patient Stated     Medication Monitoring (pt-stated)        Patient Goals/Self-Care Activities Over the next 90 days, patient will:  - take medications as prescribed check glucose daily, document, and provide at future appointments check blood pressure periodically, document, and provide at future appointments         The patient verbalized understanding of instructions, educational materials, and care plan provided today and agreed to receive a mailed copy of patient instructions, educational materials, and care plan.    Plan: Telephone follow up appointment with care management team member scheduled for:  ~8 weeks  Catie Darnelle Maffucci, PharmD, [place], [place] Clinical Pharmacist [employer] at [employer] [phone]

## 2020-09-21 ENCOUNTER — Telehealth: Payer: Self-pay

## 2020-09-21 ENCOUNTER — Other Ambulatory Visit: Payer: Self-pay | Admitting: Family Medicine

## 2020-09-21 DIAGNOSIS — E1165 Type 2 diabetes mellitus with hyperglycemia: Secondary | ICD-10-CM

## 2020-09-21 MED ORDER — METFORMIN HCL ER 500 MG PO TB24: 1000.0000 mg | ORAL_TABLET | Freq: Two times a day (BID) | ORAL | 0 refills | Status: DC

## 2020-09-21 NOTE — Addendum Note (Signed)
Addended by: Thressa Sheller on: 09/21/2020 11:48 AM   Modules accepted: Orders

## 2020-09-21 NOTE — Telephone Encounter (Signed)
Pt called in requesting a 3 day prescription for Metformin.

## 2020-09-21 NOTE — Telephone Encounter (Signed)
Medication sent in to preferred pharmacy per protocol.  Patient's daughter informed and verbalized understanding.

## 2020-09-22 ENCOUNTER — Other Ambulatory Visit: Payer: Self-pay

## 2020-09-22 ENCOUNTER — Other Ambulatory Visit (INDEPENDENT_AMBULATORY_CARE_PROVIDER_SITE_OTHER): Payer: Medicare HMO

## 2020-09-22 ENCOUNTER — Other Ambulatory Visit: Payer: Medicare HMO

## 2020-09-22 DIAGNOSIS — E119 Type 2 diabetes mellitus without complications: Secondary | ICD-10-CM

## 2020-09-22 DIAGNOSIS — E538 Deficiency of other specified B group vitamins: Secondary | ICD-10-CM | POA: Diagnosis not present

## 2020-09-22 LAB — VITAMIN B12: Vitamin B-12: >1550 pg/mL — ABNORMAL HIGH (ref 211–911)

## 2020-09-22 LAB — HEMOGLOBIN A1C: Hgb A1c MFr Bld: 6.8 % — ABNORMAL HIGH (ref 4.6–6.5)

## 2020-09-23 ENCOUNTER — Other Ambulatory Visit: Payer: Self-pay | Admitting: Family Medicine

## 2020-09-23 NOTE — Progress Notes (Signed)
Left message for patient to return call back for lab results.

## 2020-09-28 ENCOUNTER — Ambulatory Visit: Payer: Medicare HMO | Admitting: Pharmacist

## 2020-09-28 DIAGNOSIS — I5032 Chronic diastolic (congestive) heart failure: Secondary | ICD-10-CM

## 2020-09-28 DIAGNOSIS — E782 Mixed hyperlipidemia: Secondary | ICD-10-CM

## 2020-09-28 DIAGNOSIS — I25118 Atherosclerotic heart disease of native coronary artery with other forms of angina pectoris: Secondary | ICD-10-CM

## 2020-09-28 DIAGNOSIS — J432 Centrilobular emphysema: Secondary | ICD-10-CM

## 2020-09-28 DIAGNOSIS — E119 Type 2 diabetes mellitus without complications: Secondary | ICD-10-CM

## 2020-09-28 NOTE — Chronic Care Management (AMB) (Signed)
Chronic Care Management Pharmacy Note  09/28/2020 Name:  Matthew Miles. MRN:  [id number] DOB:  [birth year]  Summary:  Recommendations/Changes made from today's visit:  Plan:  Subjective: Matthew Blank. is an 87 y.o. year old male who is a primary patient of [name], [name], MD.  The CCM team was consulted for assistance with disease management and care coordination needs.    Engaged with patient's son, Matthew Miles by telephone  for  medication management question  in response to provider referral for pharmacy case management and/or care coordination services.   Consent to Services:  The patient was given information about Chronic Care Management services, agreed to services, and gave verbal consent prior to initiation of services.  Please see initial visit note for detailed documentation.   Patient Care Team: [name], MD as PCP - General (Family Medicine) Rockey Situ Kathlene November, MD as PCP - Cardiology (Cardiology) Minna Merritts, MD as Consulting Physician (Cardiology) De Hollingshead, RPH-CPP as Pharmacist (Pharmacist)   Objective:  Lab Results  Component Value Date   CREATININE 1.37 (H) 05/29/2020   CREATININE 1.01 02/19/2020   CREATININE 1.27 12/30/2019    Lab Results  Component Value Date   HGBA1C 6.8 (H) 09/22/2020   Last diabetic Eye exam:  Lab Results  Component Value Date/Time   HMDIABEYEEXA Retinopathy (A) 06/12/2020 12:00 AM    Last diabetic Foot exam:  Lab Results  Component Value Date/Time   HMDIABFOOTEX Dr. Milinda Pointer 12/18/2013 12:00 AM        Component Value Date/Time   CHOL 104 03/16/2018 1354   TRIG 118.0 03/16/2018 1354   HDL 28.50 (L) 03/16/2018 1354   CHOLHDL 4 03/16/2018 1354   VLDL 23.6 03/16/2018 1354   LDLCALC 51 03/16/2018 1354   LDLDIRECT 80.0 08/05/2015 1405    Hepatic Function Latest Ref Rng & Units 05/29/2020 12/30/2019 10/29/2018  Total Protein 6.5 - 8.1 g/dL 6.8 6.2 5.9(L)  Albumin 3.5 - 5.0 g/dL 3.9 4.1 4.1   AST 15 - 41 U/L 19 13 14   ALT 0 - 44 U/L 14 12 11   Alk Phosphatase 38 - 126 U/L 73 93 79  Total Bilirubin 0.3 - 1.2 mg/dL 0.7 0.5 0.4  Bilirubin, Direct 0.0 - 0.2 mg/dL 0.1 - -    Lab Results  Component Value Date/Time   TSH 4.92 (H) 12/30/2019 02:16 PM   TSH 3.20 10/31/2013 10:51 AM    CBC Latest Ref Rng & Units 05/29/2020 12/30/2019 01/16/2019  WBC 4.0 - 10.5 K/uL 3.8(L) 4.1 5.3  Hemoglobin 13.0 - 17.0 g/dL 9.0(L) 11.2(L) 11.2(L)  Hematocrit 39.0 - 52.0 % 28.3(L) 33.9(L) 34.7(L)  Platelets 150 - 400 K/uL 74(L) 83.0(L) 100.0(L)    No results found for: VD25OH  Clinical ASCVD: No  The ASCVD Risk score [name], et al., 2013) failed to calculate for the following reasons:   The 2013 ASCVD risk score is only valid for ages 22 to 67      Social History   Tobacco Use  Smoking Status Former   Packs/day: 1.00   Years: 65.00   Pack years: 65.00   Types: Cigarettes   Quit date: 04/10/2011   Years since quitting: 9.4  Smokeless Tobacco Former   Types: Chew   BP Readings from Last 3 Encounters:  05/29/20 120/62  05/12/20 (!) 122/50  04/01/20 118/60   Pulse Readings from Last 3 Encounters:  05/29/20 90  05/12/20 81  04/01/20 91   Wt Readings from Last  3 Encounters:  07/31/20 170 lb (77.1 kg)  06/26/20 159 lb (72.1 kg)  06/25/20 159 lb (72.1 kg)    Assessment: Review of patient past medical history, allergies, medications, health status, including review of consultants reports, laboratory and other test data, was performed as part of comprehensive evaluation and provision of chronic care management services.   SDOH:  (Social Determinants of Health) assessments and interventions performed:    CCM Care Plan  Allergies  Allergen Reactions   Oxybutynin     Dry mouth and constant urination.   Sulfa Antibiotics     GI upset   Tradjenta [Linagliptin] Other (See Comments)    Hair loss    Medications Reviewed Today     Reviewed by De Hollingshead, RPH-CPP  (Pharmacist) on 09/17/20 at 0926  Med List Status: <None>   Medication Order Taking? Sig Documenting Provider Last Dose Status Informant  ACCU-CHEK GUIDE test strip [id number] Yes TEST BLOOD SUGAR TWICE DAILY AS DIRECTED [name], MD Taking Active   alfuzosin (UROXATRAL) 10 MG 24 hr tablet [id number] Yes Take 1 tablet (10 mg total) by mouth daily with breakfast. [name], MD Taking Active   aspirin 81 MG tablet [id number] Yes Take 81 mg by mouth daily.  [provider] Taking Active Self  CALCIUM PO [id number] Yes Take 1,200 mg by mouth daily. Calcium carbonate + 1000 units Vit D [provider] Taking Active   carvedilol (COREG) 6.25 MG tablet [id number] Yes TAKE 1 TABLET(6.25 MG) BY MOUTH TWICE DAILY [name], MD Taking Active   clotrimazole (LOTRIMIN) 1 % cream [id number]  Apply 1 application topically 2 (two) times daily. [name], MD  Active   Cyanocobalamin (B-12) 1000 MCG TBCR [id number] Yes Take 1 tablet by mouth daily.  [provider] Taking Active Self  Dulaglutide (TRULICITY) 1.5 TO/6.7TI SOPN [id number] Yes Inject 1.5 mg into the skin once a week. [provider] Taking Active   FEROSUL 325 (65 Fe) MG tablet [id number] Yes TAKE 1 TABLET TWICE DAILY WITH MEALS [name], MD Taking Active            Med Note Nat Christen Jun 25, 2020 11:19 AM) Taking once daily  Lancets [facility] ULTRASOFT) lancets [id number]  Use as instructed [name], MD  Active   metFORMIN (GLUCOPHAGE-XR) 500 MG 24 hr tablet [id number] Yes TAKE 2 TABLETS(1000 MG) BY MOUTH TWICE DAILY [name], MD Taking Active   nitroGLYCERIN (NITROSTAT) 0.4 MG SL tablet [id number]  Place 1 tablet (0.4 mg total) under the tongue every 5 (five) minutes as needed. Minna Merritts, MD  Active   pantoprazole (PROTONIX) 20 MG tablet [id number] Yes TAKE 1 TABLET EVERY DAY [name], MD Taking Active   potassium chloride  (KLOR-CON) 10 MEQ tablet [id number] Yes TAKE 1 TABLET (10 MEQ) BY MOUTH DAILY [name], MD Taking Active   pravastatin (PRAVACHOL) 40 MG tablet [id number] Yes TAKE 1 TABLET EVERY DAY [name], MD Taking Active   PROAIR HFA 108 [phone] Base) MCG/ACT inhaler [id number] Yes INHALE 2 PUFFS INTO THE LUNGS EVERY 6 HOURS AS NEEDED FOR WHEEZING OR SHORTNESS OF BREATH [name], MD Taking Active   Tiotropium Bromide Monohydrate (SPIRIVA RESPIMAT) 2.5 MCG/ACT AERS [id number] Yes Inhale 2 puffs into the lungs daily. [name], MD Taking Active   torsemide (DEMADEX) 10 MG tablet [id number] Yes TAKE 1 TABLET(10 MG) BY MOUTH TWICE DAILY AS  NEEDED [name], MD Taking Active            Med Note Nat Christen Jun 25, 2020 11:23 AM) Taking 2 QAM  valACYclovir (VALTREX) 500 MG tablet [id number] Yes Take 500 mg by mouth 2 (two) times daily. [provider] Taking Active   Zinc Oxide 13 % CREA [id number] No Apply as needed to the wound on your left buttocks  Patient not taking: Reported on 09/17/2020   [name], MD Not Taking Active             Patient Active Problem List   Diagnosis Date Noted   History of COVID-19 07/31/2020   Shingles 07/31/2020   B12 deficiency 07/31/2020   COVID-19 04/06/2020   PAD (peripheral artery disease) ([place]) 01/09/2020   Chronic venous insufficiency 01/09/2020   Elevated TSH 01/01/2020   Decreased pedal pulses 01/01/2020   Lower urinary tract symptoms (LUTS) 06/28/2019   Dysuria 03/18/2019   Pressure ulcer 01/16/2019   Diabetic neuropathy ([place]) 12/31/2018   Diabetic retinopathy ([place]) 10/07/2018   Milk intolerance 07/18/2018   Monoclonal B-cell lymphocytosis 07/18/2018   Prostate cancer ([place]) 07/04/2018   BPH (benign prostatic hyperplasia) 02/10/2017   Left leg pain 10/13/2016   Falls 08/05/2016   Chest pain 07/30/2016   Hip pain, right 02/03/2016   Actinic keratoses 12/24/2015   Osteoarthritis of both  hands 12/24/2015   Obesity (BMI 30-39.9) 10/21/2014   Essential hypertension 07/21/2014   Murmur, cardiac 10/15/2013   Chronic low back pain 10/08/2013   Chronic diastolic CHF (congestive heart failure) ([place]) 04/15/2013   Thrombocytopenia ([place]) 08/21/2012   Urge incontinence 07/02/2012   Hyperlipidemia 06/08/2012   H/O malignant neoplasm of prostate 06/04/2012   Diabetes ([place]) 04/26/2012   Iron deficiency anemia 01/06/2012   Pulmonary hypertension ([place]) 11/25/2011   GERD (gastroesophageal reflux disease) 06/13/2011   Coronary artery disease of native artery of native heart with stable angina pectoris ([place]) 04/26/2011   Tachycardia 04/26/2011   COPD (chronic obstructive pulmonary disease) ([place])     Immunization History  Administered Date(s) Administered   Fluad Quad(high Dose 65+) 01/01/2020   Influenza Split 12/29/2011   Influenza, High Dose Seasonal PF 12/24/2015, 01/14/2017, 01/31/2018, 01/05/2019   Influenza,inj,Quad PF,6+ Mos 01/10/2013, 01/17/2014, 01/22/2015   Influenza-Unspecified 12/20/2011, 01/10/2013, 01/17/2014, 01/22/2015, 01/05/2019   PFIZER(Purple Top)SARS-COV-2 Vaccination 05/21/2019, 06/11/2019, 11/19/2019, 01/20/2020   Pneumococcal Conjugate-13 04/21/2014   Pneumococcal Polysaccharide-23 12/29/2011   Tdap 04/07/2011    Conditions to be addressed/monitored: HTN and DMII  Care Plan : Medication Management  Updates made by De Hollingshead, RPH-CPP since 09/28/2020 12:00 AM     Problem: Diabetes, HTN, Prostate Ca      Long-Range Goal: Disease Progression Prevention   Start Date: 05/07/2020  Recent Progress: On track  Priority: High  Note:   Current Barriers:  Complex patient with multiple comorbidities increasing risk for hospitalization  Pharmacist Clinical Goal(s):  Over the next 90 days, patient will achieve adherence to monitoring guidelines and medication adherence to achieve therapeutic efficacy through collaboration with PharmD and provider.    Interventions: 1:1 collaboration with [name], MD regarding development and update of comprehensive plan of care as evidenced by provider attestation and co-signature Inter-disciplinary care team collaboration (see longitudinal plan of care) Comprehensive medication review performed; medication list updated in electronic medical record  Health Maintenance: Due for A1c. See below. Moving forward, evaluate appropriate timing for pneumococcal and second shingrix vaccination pending chemotherapy plan  Diabetes:  Controlled per last A1c; current treatment: metformin XR 6503 mg BID, Trulicity 1.5 mg weekly Wonders if metformin is causing diarrhea. Reported occasional issues at our last phone call Hx Jardiance - d/t d/t hypotension  If patient still reports diarrhea at next visit, can consider reducing metformin XR dose to see if improvement in diarrhea.   Hypertension/HFpEF Appropriately managed; current treatment: carvedilol 6.25 mg BID, torsemide 20 mg (takes 2 10 mg QAM); follows w/ [name] Previously recommended to continue periodic monitoring of home BP, along with collaboration with cardiology. Continue current regimen at this time  Hyperlipidemia: Controlled per last lipid panel, but was in 2019; current treatment: pravastatin 40 mg daily   Antiplatelet regimen: aspirin 81 mg daily Recommend to check lipid panel with next lab work.  Chronic Obstructive Pulmonary Disease: Controlled per patient report; current treatment: Spiriva Respimat 2.5 mcg 2 puffs daily; albuterol HFA PRN -reports he has only needed when out in the yard working out GOLD Classification: B Previously recommended to continue current regimen at this time  GERD: Improved per patient report; current regimen: pantoprazole 20 mg QAM Previously recommended to continue current regimen at this time   Shingles (in setting of chemotherapy) Improving per patient report; current regimen: valacyclovir 500 mg  BID  Previously recommended to continue current regimen along with collaboration w/ hem/onc  Hx Prostate Cancer: PSA increased on last check. Recent visit with [facility] Hem/Onc - no evidence of disease progression  Current treatment for BPH symptoms: alfuzosin 10 mg daily Recommended to continue current regimen along with collaboration with Urology Hem/Onc Supplements: calcium carbonate + vitamin d; discussed transition to calcium citrate moving forward due to concurrent PPI Son called today to confirm appropriate dosing of calcium citrate. They had purchased a calcium citrate 400 mg + Vitamin D3 400 IU and planned on him taking 2 daily. Confirmed this is appropriate to receive adequate supplementation in combination with the expected ~250 mg calcium from various dietary sources throughout the day, as patient does not generally consume a dairy product with high calcium content. Discussed splitting up administration given that the body can generally only absorb ~ 500 calcium at a time. Johnny verbalized understanding.   Splenic Marginal Zone B-Cell Lymphoma: Follows with [facility]. Current regimen: rituximab (received Evusheld) Previously recommended to continue collaboration with oncology.    Patient Goals/Self-Care Activities Over the next 90 days, patient will:  - take medications as prescribed check glucose daily, document, and provide at future appointments check blood pressure periodically, document, and provide at future appointments  Follow Up Plan: Telephone follow up appointment with care management team member scheduled for: ~ 7 weeks as previously scheduled       Medication Assistance: None required.  Patient affirms current coverage meets needs.  Patient's preferred pharmacy is:  [id number] [address], [address] AT [address] [place] [address] Phone: [phone] Fax: [phone]  [facility] - [address], [address] [place] [address] Phone: [phone] Fax: [phone]  Uses pill box? Yes Pt endorses 100% compliance  Follow Up:  Patient agrees to Care Plan and Follow-up.  Plan: Telephone follow up appointment with care management team member scheduled for:  ~ 7 weeks as previously scheduled  Catie Darnelle Maffucci, PharmD, [place], [place] Clinical Pharmacist [employer] at [employer] [phone]

## 2020-09-28 NOTE — Patient Instructions (Signed)
Visit Information  PATIENT GOALS:  Goals Addressed               This Visit's Progress     Patient Stated     Medication Monitoring (pt-stated)        Patient Goals/Self-Care Activities Over the next 90 days, patient will:  - take medications as prescribed check glucose daily, document, and provide at future appointments check blood pressure periodically, document, and provide at future appointments          The patient verbalized understanding of instructions, educational materials, and care plan provided today and declined offer to receive copy of patient instructions, educational materials, and care plan.  Plan: Telephone follow up appointment with care management team member scheduled for:  ~ 7 weeks as previously scheduled  Catie Darnelle Maffucci, PharmD, [place], [place] Clinical Pharmacist [employer] at [employer] [phone]

## 2020-09-30 DIAGNOSIS — J45909 Unspecified asthma, uncomplicated: Secondary | ICD-10-CM | POA: Diagnosis not present

## 2020-09-30 DIAGNOSIS — R591 Generalized enlarged lymph nodes: Secondary | ICD-10-CM | POA: Diagnosis not present

## 2020-09-30 DIAGNOSIS — Z87891 Personal history of nicotine dependence: Secondary | ICD-10-CM | POA: Diagnosis not present

## 2020-09-30 DIAGNOSIS — Z5112 Encounter for antineoplastic immunotherapy: Secondary | ICD-10-CM | POA: Diagnosis not present

## 2020-09-30 DIAGNOSIS — C8307 Small cell B-cell lymphoma, spleen: Secondary | ICD-10-CM | POA: Diagnosis not present

## 2020-09-30 DIAGNOSIS — C61 Malignant neoplasm of prostate: Secondary | ICD-10-CM | POA: Diagnosis not present

## 2020-11-03 ENCOUNTER — Encounter: Payer: Self-pay | Admitting: Family Medicine

## 2020-11-03 ENCOUNTER — Ambulatory Visit (INDEPENDENT_AMBULATORY_CARE_PROVIDER_SITE_OTHER): Payer: Medicare HMO | Admitting: Family Medicine

## 2020-11-03 ENCOUNTER — Other Ambulatory Visit: Payer: Self-pay

## 2020-11-03 VITALS — BP 120/60 | HR 106 | Temp 98.1°F | Ht 63.0 in | Wt 174.2 lb

## 2020-11-03 DIAGNOSIS — E119 Type 2 diabetes mellitus without complications: Secondary | ICD-10-CM | POA: Diagnosis not present

## 2020-11-03 DIAGNOSIS — J432 Centrilobular emphysema: Secondary | ICD-10-CM

## 2020-11-03 DIAGNOSIS — C61 Malignant neoplasm of prostate: Secondary | ICD-10-CM

## 2020-11-03 DIAGNOSIS — I10 Essential (primary) hypertension: Secondary | ICD-10-CM

## 2020-11-03 DIAGNOSIS — E782 Mixed hyperlipidemia: Secondary | ICD-10-CM | POA: Diagnosis not present

## 2020-11-03 DIAGNOSIS — N401 Enlarged prostate with lower urinary tract symptoms: Secondary | ICD-10-CM | POA: Diagnosis not present

## 2020-11-03 DIAGNOSIS — E538 Deficiency of other specified B group vitamins: Secondary | ICD-10-CM | POA: Diagnosis not present

## 2020-11-03 LAB — VITAMIN B12: Vitamin B-12: 1549 pg/mL — ABNORMAL HIGH (ref 211–911)

## 2020-11-03 LAB — LIPID PANEL
Cholesterol: 125 mg/dL (ref 0–200)
HDL: 33.6 mg/dL — ABNORMAL LOW (ref >39.00)
LDL Cholesterol: 55 mg/dL (ref 0–99)
NonHDL: 91.6
Total CHOL/HDL Ratio: 4
Triglycerides: 184 mg/dL — ABNORMAL HIGH (ref 0.0–149.0)
VLDL: 36.8 mg/dL (ref 0.0–40.0)

## 2020-11-03 NOTE — Assessment & Plan Note (Signed)
He will continue to see his specialists at [facility].

## 2020-11-03 NOTE — Assessment & Plan Note (Signed)
Recheck B12 level 

## 2020-11-03 NOTE — Patient Instructions (Signed)
Nice to see you. We will get lab work today. Please try to cut down on your carbohydrate intake.

## 2020-11-03 NOTE — Progress Notes (Signed)
Matthew Rumps, MD Phone: [phone]  Matthew Miles. is a 87 y.o. male who presents today for follow-up.  Diabetes: Notes his blood sugar has trended up some recently.  It has occasionally been higher than 130 fasting.  He has been using Trulicity as well as metformin.  No polyuria or polydipsia.  COPD: Continues on Spiriva.  No cough, shortness of breath, or wheezing.  Hypertension: Reports this is generally normal.  He continues on carvedilol as well as torsemide.  No chest pain, shortness of breath, or edema.  BPH/urinary incontinence: He reports he was tried on a medicine through [facility] urology and that gave him significant dry mouth so he quit taking it.  He does leak urine fairly consistently.  He wears depends to help with this.  Occasionally he will have some urinary discomfort in the morning.  He sees urology this week for follow-up.  Social History   Tobacco Use  Smoking Status Former   Packs/day: 1.00   Years: 65.00   Pack years: 65.00   Types: Cigarettes   Quit date: 04/10/2011   Years since quitting: 9.5  Smokeless Tobacco Former   Types: Chew    Current Outpatient Medications on File Prior to Visit  Medication Sig Dispense Refill   ACCU-CHEK GUIDE test strip TEST BLOOD SUGAR TWICE DAILY AS DIRECTED 200 strip 2   alfuzosin (UROXATRAL) 10 MG 24 hr tablet Take 1 tablet (10 mg total) by mouth daily with breakfast. 90 tablet 0   aspirin 81 MG tablet Take 81 mg by mouth daily.      CALCIUM PO Take 1,200 mg by mouth daily. Calcium carbonate + 1000 units Vit D     carvedilol (COREG) 6.25 MG tablet TAKE 1 TABLET TWICE DAILY 180 tablet 2   clotrimazole (LOTRIMIN) 1 % cream Apply 1 application topically 2 (two) times daily. 30 g 0   Cyanocobalamin (B-12) 1000 MCG TBCR Take 1 tablet by mouth daily.      Dulaglutide (TRULICITY) 1.5 0000000 SOPN Inject 1.5 mg into the skin once a week.     FEROSUL 325 (65 Fe) MG tablet TAKE 1 TABLET TWICE DAILY WITH MEALS 180 tablet 1    Lancets (ONETOUCH ULTRASOFT) lancets Use as instructed 100 each 12   metFORMIN (GLUCOPHAGE-XR) 500 MG 24 hr tablet TAKE 2 TABLETS(1000 MG) BY MOUTH TWICE DAILY 360 tablet 3   metFORMIN (GLUCOPHAGE-XR) 500 MG 24 hr tablet Take 2 tablets (1,000 mg total) by mouth in the morning and at bedtime. 16 tablet 0   nitroGLYCERIN (NITROSTAT) 0.4 MG SL tablet Place 1 tablet (0.4 mg total) under the tongue every 5 (five) minutes as needed. 25 tablet 0   pantoprazole (PROTONIX) 20 MG tablet TAKE 1 TABLET EVERY DAY 90 tablet 1   potassium chloride (KLOR-CON) 10 MEQ tablet TAKE 1 TABLET (10 MEQ) BY MOUTH DAILY 90 tablet 1   pravastatin (PRAVACHOL) 40 MG tablet TAKE 1 TABLET EVERY DAY 90 tablet 1   PROAIR HFA 108 (90 Base) MCG/ACT inhaler INHALE 2 PUFFS INTO THE LUNGS EVERY 6 HOURS AS NEEDED FOR WHEEZING OR SHORTNESS OF BREATH 8.5 g 0   Tiotropium Bromide Monohydrate (SPIRIVA RESPIMAT) 2.5 MCG/ACT AERS Inhale 2 puffs into the lungs daily. 4 g 5   torsemide (DEMADEX) 10 MG tablet TAKE 1 TABLET(10 MG) BY MOUTH TWICE DAILY AS NEEDED 180 tablet 3   valACYclovir (VALTREX) 500 MG tablet Take 500 mg by mouth 2 (two) times daily.     Zinc Oxide 13 %  CREA Apply as needed to the wound on your left buttocks 113 g 1   No current facility-administered medications on file prior to visit.     ROS see history of present illness  Objective  Physical Exam Vitals:   11/03/20 1111  BP: 120/60  Pulse: (!) 106  Temp: 98.1 F (36.7 C)  SpO2: 99%    BP Readings from Last 3 Encounters:  11/03/20 120/60  05/29/20 120/62  05/12/20 (!) 122/50   Wt Readings from Last 3 Encounters:  11/03/20 174 lb 3.2 oz (79 kg)  07/31/20 170 lb (77.1 kg)  06/26/20 159 lb (72.1 kg)    Physical Exam Constitutional:      General: He is not in acute distress.    Appearance: He is not diaphoretic.  Cardiovascular:     Rate and Rhythm: Normal rate and regular rhythm.     Heart sounds: Normal heart sounds.  Pulmonary:     Effort:  Pulmonary effort is normal.     Breath sounds: Normal breath sounds.  Musculoskeletal:     Right lower leg: No edema.     Left lower leg: No edema.  Skin:    General: Skin is warm and dry.  Neurological:     Mental Status: He is alert.     Assessment/Plan: Please see individual problem list.  Problem List Items Addressed This Visit     COPD (chronic obstructive pulmonary disease) ([place]) (Chronic)    Stable.  He will continue Spiriva 2 puffs daily.  He can continue as needed albuterol.       Essential hypertension (Chronic)    Well-controlled.  He will continue carvedilol 6.25 mg twice daily and torsemide on an as-needed basis.       B12 deficiency - Primary    Recheck B12 level.       Relevant Orders   B12   BPH (benign prostatic hyperplasia)    He will follow-up with urology to get their input on further treatment for this.       Diabetes ([place])    This has worsened slightly based on his home CBGs.  He will work on his diet and try to decrease his carbohydrate intake.  He will continue metformin XR 1000 mg twice daily and Trulicity 1.5 mg weekly.       Hyperlipidemia   Relevant Orders   Lipid panel   Prostate cancer [facility])    He will continue to see his specialists at [facility].        Return in about 3 months (around 02/03/2021).  This visit occurred during the SARS-CoV-2 public health emergency.  Safety protocols were in place, including screening questions prior to the visit, additional usage of staff PPE, and extensive cleaning of exam room while observing appropriate contact time as indicated for disinfecting solutions.    Matthew Rumps, MD [place] Follow-up

## 2020-11-03 NOTE — Assessment & Plan Note (Signed)
Stable.  He will continue Spiriva 2 puffs daily.  He can continue as needed albuterol.

## 2020-11-03 NOTE — Assessment & Plan Note (Signed)
Well-controlled.  He will continue carvedilol 6.25 mg twice daily and torsemide on an as-needed basis.

## 2020-11-03 NOTE — Assessment & Plan Note (Signed)
He will follow-up with urology to get their input on further treatment for this.

## 2020-11-03 NOTE — Assessment & Plan Note (Signed)
This has worsened slightly based on his home CBGs.  He will work on his diet and try to decrease his carbohydrate intake.  He will continue metformin XR 1000 mg twice daily and Trulicity 1.5 mg weekly.

## 2020-11-05 DIAGNOSIS — C61 Malignant neoplasm of prostate: Secondary | ICD-10-CM | POA: Diagnosis not present

## 2020-11-05 DIAGNOSIS — R161 Splenomegaly, not elsewhere classified: Secondary | ICD-10-CM | POA: Diagnosis not present

## 2020-11-05 DIAGNOSIS — Z79818 Long term (current) use of other agents affecting estrogen receptors and estrogen levels: Secondary | ICD-10-CM | POA: Diagnosis not present

## 2020-11-05 DIAGNOSIS — R972 Elevated prostate specific antigen [PSA]: Secondary | ICD-10-CM | POA: Diagnosis not present

## 2020-11-05 DIAGNOSIS — Z79899 Other long term (current) drug therapy: Secondary | ICD-10-CM | POA: Diagnosis not present

## 2020-11-05 DIAGNOSIS — C8307 Small cell B-cell lymphoma, spleen: Secondary | ICD-10-CM | POA: Diagnosis not present

## 2020-11-05 DIAGNOSIS — D696 Thrombocytopenia, unspecified: Secondary | ICD-10-CM | POA: Diagnosis not present

## 2020-11-08 NOTE — Progress Notes (Signed)
cardiology Office Note  Date:  11/09/2020   ID:  Matthew Blank., DOB [birth year], MRN [id number]  PCP:  Matthew Haven, MD   Chief Complaint  Patient presents with   6 month follow up     Patient c/o cramping in legs. Medications reviewed by the patient verbally.     HPI:  Matthew Miles is a 87  yo-old gentleman with history of  DM II, HBA1C 7.5 coronary artery disease (occluded LAD),  moderate pulmonary hypertension in early 2013,  long history of smoking for 60 years, stopped more than 6 years ago COPD, Chronic bronchitis   hematemesis , gastric ulcer seen on EGD,  nonsustained VT per the notes,  acute renal failure  Ejection fraction 55% in 2015 Prostate cancer, XRT who presents for followup  Of his coronary artery disease  LOV 05/2020 Going to [facility] for cancer treatments lymphadenopathy and splenomegaly and cytopenias (anemia/thrombocytopenia).  Plan for 2 years of treatment  Lab work reviewed Testosterone level 16  On torsemide 20 mg daily Taking potassium 10 daily, potassium level 5.1  Having cramps, last Friday Not all the time, symptoms rare Takes mustard/vinegar when he has symptoms  Dec 25 , 2021: covid + He was vaccinated  On inhaler, uses once a day No oxygen No exercise Goes shopping  No angina/chest pain, feels symptoms are stable Rare GERD  chronic mild leg edema, nonpitting  EKG personally reviewed by myself on todays visit Shows normal sinus rhythm rate 81 bpm left anterior fascicular block, old anterior MI   Lab Results  Component Value Date   CHOL 125 11/03/2020   HDL 33.60 (L) 11/03/2020   LDLCALC 55 11/03/2020   TRIG 184.0 (H) 11/03/2020     Other past medical history reviewed Chronic low back pain Reports that he had a bleeding ulcer 4 years ago at that time stop smoking Last echocardiogram in 2015 showing normal ejection fraction, right heart pressures 37 mmHg    left and right heart cath done in February 2013 showing  pulmonary hypertension, stable severe coronary artery disease   Normal ejection fraction   Right heart catheterization showed significant fluid overload with wedge pressure of 24, moderate pulmonary hypertension. Catheterization showed occluded LAD which was old, no other significant stenoses requiring intervention.   PMH:   has a past medical history of Allergy, Arthritis, CHF (congestive heart failure) ([place]), Chicken pox, Cholecystitis, Colon polyps, COPD (chronic obstructive pulmonary disease) ([place]), Coronary artery disease, Diabetes mellitus without complication ([place]), Emphysema of lung ([place]), GERD (gastroesophageal reflux disease), Heart murmur, Hematemesis/vomiting blood (04/10/11), Hypercholesterolemia, Mild hypertension, Pancreatitis, Prostate cancer ([place]), Prostate cancer ([place]), Smoker, Ulcer, and Upper GI bleed (1980).  PSH:    Past Surgical History:  Procedure Laterality Date   APPENDECTOMY     CARDIAC CATHETERIZATION  May 2002 and Feb 2013   [place]; no stents    CATARACT EXTRACTION     CHOLECYSTECTOMY     CIRCUMCISION     COLONOSCOPY     HEMORRHOID SURGERY     SP CHOLECYSTOMY     STOMACH SURGERY     bleeding ulcers, followed by Dr. [name]    Current Outpatient Medications  Medication Sig Dispense Refill   ACCU-CHEK GUIDE test strip TEST BLOOD SUGAR TWICE DAILY AS DIRECTED 200 strip 2   alfuzosin (UROXATRAL) 10 MG 24 hr tablet Take 1 tablet (10 mg total) by mouth daily with breakfast. 90 tablet 0   aspirin 81 MG tablet Take 81 mg by mouth daily.  CALCIUM PO Take 1,200 mg by mouth daily. Calcium carbonate + 1000 units Vit D     carvedilol (COREG) 6.25 MG tablet TAKE 1 TABLET TWICE DAILY 180 tablet 2   Cholecalciferol 25 MCG (1000 UT) tablet Take 1,000 Units by mouth daily.     clotrimazole (LOTRIMIN) 1 % cream Apply 1 application topically 2 (two) times daily. 30 g 0   Cyanocobalamin (B-12) 1000 MCG TBCR Take 1 tablet by mouth daily.      Dulaglutide (TRULICITY) 1.5  0000000 SOPN Inject 1.5 mg into the skin once a week.     FEROSUL 325 (65 Fe) MG tablet TAKE 1 TABLET TWICE DAILY WITH MEALS 180 tablet 1   Lancets (ONETOUCH ULTRASOFT) lancets Use as instructed 100 each 12   metFORMIN (GLUCOPHAGE-XR) 500 MG 24 hr tablet TAKE 2 TABLETS(1000 MG) BY MOUTH TWICE DAILY 360 tablet 3   nitroGLYCERIN (NITROSTAT) 0.4 MG SL tablet Place 1 tablet (0.4 mg total) under the tongue every 5 (five) minutes as needed. 25 tablet 0   pantoprazole (PROTONIX) 20 MG tablet TAKE 1 TABLET EVERY DAY 90 tablet 1   pravastatin (PRAVACHOL) 40 MG tablet TAKE 1 TABLET EVERY DAY 90 tablet 1   PROAIR HFA 108 (90 Base) MCG/ACT inhaler INHALE 2 PUFFS INTO THE LUNGS EVERY 6 HOURS AS NEEDED FOR WHEEZING OR SHORTNESS OF BREATH 8.5 g 0   Tiotropium Bromide Monohydrate (SPIRIVA RESPIMAT) 2.5 MCG/ACT AERS Inhale 2 puffs into the lungs daily. 4 g 5   torsemide (DEMADEX) 10 MG tablet TAKE 1 TABLET(10 MG) BY MOUTH TWICE DAILY AS NEEDED 180 tablet 3   valACYclovir (VALTREX) 500 MG tablet Take 500 mg by mouth 2 (two) times daily.     Zinc Oxide 13 % CREA Apply as needed to the wound on your left buttocks 113 g 1   potassium chloride (KLOR-CON) 10 MEQ tablet Take 1 tablet (10 mEq total) by mouth every other day. 45 tablet 1   No current facility-administered medications for this visit.     Allergies:   Oxybutynin, Sulfa antibiotics, and Tradjenta [linagliptin]   Social History:  The patient  reports that he quit smoking about 9 years ago. His smoking use included cigarettes. He has a 65.00 pack-year smoking history. He has quit using smokeless tobacco.  His smokeless tobacco use included chew. He reports current alcohol use. He reports that he does not use drugs.   Family History:   family history includes Emphysema in his sister; Liver cancer in his brother; Prostate cancer in his brother and father.   Review of Systems: Review of Systems  Constitutional: Negative.   Respiratory: Negative.     Gastrointestinal: Negative.   Musculoskeletal:  Positive for falls and joint pain.  Neurological: Negative.   Psychiatric/Behavioral: Negative.    All other systems reviewed and are negative. PHYSICAL EXAM: VS:  BP (!) 120/48 (BP Location: Left Arm, Patient Position: Sitting, Cuff Size: Normal)   Pulse 83   Ht '5\' 5"'$  (1.651 m)   Wt 171 lb 8 oz (77.8 kg)   SpO2 97%   BMI 28.54 kg/m  , BMI Body mass index is 28.54 kg/m. Constitutional:  oriented to person, place, and time. No distress.  HENT:  Head: Grossly normal Eyes:  no discharge. No scleral icterus.  Neck: No JVD, no carotid bruits  Cardiovascular: Regular rate and rhythm, no murmurs appreciated Pulmonary/Chest: Clear to auscultation bilaterally, no wheezes or rails Abdominal: Soft.  no distension.  no tenderness.  Musculoskeletal: Normal  range of motion Neurological:  normal muscle tone. Coordination normal. No atrophy Skin: Skin warm and dry Psychiatric: normal affect, pleasant  Recent Labs: 12/30/2019: TSH 4.92 02/19/2020: B Natriuretic Peptide 637.7 05/29/2020: ALT 14; BUN 26; Creatinine, Ser 1.37; Hemoglobin 9.0; Platelets 74; Potassium 5.0; Sodium 137   Lipid Panel Lab Results  Component Value Date   CHOL 125 11/03/2020   HDL 33.60 (L) 11/03/2020   LDLCALC 55 11/03/2020   TRIG 184.0 (H) 11/03/2020    Wt Readings from Last 3 Encounters:  11/09/20 171 lb 8 oz (77.8 kg)  11/03/20 174 lb 3.2 oz (79 kg)  07/31/20 170 lb (77.1 kg)     ASSESSMENT AND PLAN:  Pulmonary hypertension ([place]) -  Last echo 10/2013, ejection fraction greater than 55% on torsemide 20 mg daily Appears euvolemic on today's visit, no changes to his medications  Chronic diastolic CHF (congestive heart failure) (H[facility]) - on torsemide 20 daily Potassium mildly elevated recommend he decrease dose down to every other day  Essential hypertension - Plan: EKG 12-Lead Blood pressure is well controlled on today's visit. No changes made to the  medications.  Coronary artery disease involving native coronary artery of native heart without angina pectoris - Plan: EKG 12-Lead Currently with no symptoms of angina. No further workup at this time. Continue current medication regimen.  Mixed hyperlipidemia - Plan: EKG 12-Lead Cholesterol is at goal on the current lipid regimen. No changes to the medications were made.  Uncontrolled type 2 diabetes mellitus with other circulatory complication, without long-term current use of insulin (HCC) - Recent weight loss through his treatments at [facility], weight stable A1c improved 6.8  Chronic obstructive pulmonary disease, unspecified COPD type (H[facility])  On inhaler daily Stable   Total encounter time more than 25 minutes  Greater than 50% was spent in counseling and coordination of care with the patient    Orders Placed This Encounter  Procedures   EKG 12-Lead     Signed, Esmond Plants, M.D., Ph.D. 11/09/2020  [facility] [place], [place] [phone]

## 2020-11-09 ENCOUNTER — Ambulatory Visit (INDEPENDENT_AMBULATORY_CARE_PROVIDER_SITE_OTHER): Payer: Medicare HMO | Admitting: Cardiovascular Disease

## 2020-11-09 ENCOUNTER — Telehealth: Payer: Self-pay | Admitting: Cardiovascular Disease

## 2020-11-09 ENCOUNTER — Encounter: Payer: Self-pay | Admitting: Cardiovascular Disease

## 2020-11-09 ENCOUNTER — Other Ambulatory Visit: Payer: Self-pay

## 2020-11-09 ENCOUNTER — Telehealth: Payer: Self-pay | Admitting: Family Medicine

## 2020-11-09 VITALS — BP 120/48 | HR 83 | Ht 65.0 in | Wt 171.5 lb

## 2020-11-09 DIAGNOSIS — J432 Centrilobular emphysema: Secondary | ICD-10-CM

## 2020-11-09 DIAGNOSIS — I272 Pulmonary hypertension, unspecified: Secondary | ICD-10-CM

## 2020-11-09 DIAGNOSIS — I5032 Chronic diastolic (congestive) heart failure: Secondary | ICD-10-CM | POA: Diagnosis not present

## 2020-11-09 DIAGNOSIS — I25118 Atherosclerotic heart disease of native coronary artery with other forms of angina pectoris: Secondary | ICD-10-CM

## 2020-11-09 DIAGNOSIS — E1059 Type 1 diabetes mellitus with other circulatory complications: Secondary | ICD-10-CM | POA: Diagnosis not present

## 2020-11-09 DIAGNOSIS — I10 Essential (primary) hypertension: Secondary | ICD-10-CM

## 2020-11-09 DIAGNOSIS — I2510 Atherosclerotic heart disease of native coronary artery without angina pectoris: Secondary | ICD-10-CM

## 2020-11-09 MED ORDER — NITROGLYCERIN 0.4 MG SL SUBL: 0.4000 mg | SUBLINGUAL_TABLET | SUBLINGUAL | 3 refills | Status: DC | PRN

## 2020-11-09 MED ORDER — CARVEDILOL 6.25 MG PO TABS: ORAL_TABLET | ORAL | 3 refills | Status: DC

## 2020-11-09 MED ORDER — POTASSIUM CHLORIDE ER 10 MEQ PO TBCR: 10.0000 meq | EXTENDED_RELEASE_TABLET | ORAL | 1 refills | Status: DC

## 2020-11-09 MED ORDER — PRAVASTATIN SODIUM 40 MG PO TABS: 40.0000 mg | ORAL_TABLET | Freq: Every day | ORAL | 3 refills | Status: DC

## 2020-11-09 MED ORDER — TORSEMIDE 10 MG PO TABS: ORAL_TABLET | ORAL | 3 refills | Status: DC

## 2020-11-09 MED ORDER — POTASSIUM CHLORIDE ER 10 MEQ PO TBCR: 10.0000 meq | EXTENDED_RELEASE_TABLET | ORAL | 3 refills | Status: DC

## 2020-11-09 NOTE — Telephone Encounter (Signed)
Patients daughter called and would like to know if he is still supposed to take Jardiance 10 mg.

## 2020-11-09 NOTE — Telephone Encounter (Signed)
*  STAT* If patient is at the pharmacy, call can be transferred to refill team.   1. Which medications need to be refilled? (please list name of each medication and dose if known)   Pravastatin 40 mg po q d  Potassium chloride 10 mEq po EOD Nitro 0.4 mg SL q 5 min prn  2. Which pharmacy/location (including street and city if local pharmacy) is medication to be sent to?  RESEND TO [facility]   3. Do they need a 30 day or 90 day supply? [place]

## 2020-11-09 NOTE — Patient Instructions (Addendum)
Medication Instructions:  Potassium  Please take every other day  If you need a refill on your cardiac medications before your next appointment, please call your pharmacy   Lab work: No new labs needed  Testing/Procedures: No new testing needed  Follow-Up: At [facility], you and your health needs are our priority.  As part of our continuing mission to provide you with exceptional heart care, we have created designated Provider Care Teams.  These Care Teams include your primary Cardiologist (physician) and Advanced Practice Providers (APPs -  Physician Assistants and Nurse Practitioners) who all work together to provide you with the care you need, when you need it.  You will need a follow up appointment in 12 months  Providers on your designated Care Team:   Murray Hodgkins, NP Christell Faith, PA-C Marrianne Mood, PA-C Cadence [place], [place]   COVID-19 Vaccine Information can be found at: ShippingScam.co.uk For questions related to vaccine distribution or appointments, please email [email or web address] or call [phone].

## 2020-11-09 NOTE — Telephone Encounter (Signed)
He should be on Trulicity and metformin.  Based on my last discussion with him those are the 2 medicines he was taking.  Has he been taking Jardiance?

## 2020-11-10 NOTE — Telephone Encounter (Signed)
Noted. He can remain on the trulicity and metformin at this time.

## 2020-11-10 NOTE — Telephone Encounter (Signed)
I called and spoke with the patient's daughter and she stated the patient is not taking jardiance and he is taking the metformin and trulicity, She stated she was just confused with the med list all are different.  Terrance Usery,cma

## 2020-11-17 ENCOUNTER — Telehealth: Payer: Self-pay | Admitting: Pharmacist

## 2020-11-17 ENCOUNTER — Ambulatory Visit (INDEPENDENT_AMBULATORY_CARE_PROVIDER_SITE_OTHER): Payer: Medicare HMO | Admitting: Pharmacist

## 2020-11-17 DIAGNOSIS — C61 Malignant neoplasm of prostate: Secondary | ICD-10-CM

## 2020-11-17 DIAGNOSIS — J432 Centrilobular emphysema: Secondary | ICD-10-CM

## 2020-11-17 DIAGNOSIS — I25118 Atherosclerotic heart disease of native coronary artery with other forms of angina pectoris: Secondary | ICD-10-CM

## 2020-11-17 DIAGNOSIS — I10 Essential (primary) hypertension: Secondary | ICD-10-CM

## 2020-11-17 DIAGNOSIS — E119 Type 2 diabetes mellitus without complications: Secondary | ICD-10-CM

## 2020-11-17 DIAGNOSIS — N401 Enlarged prostate with lower urinary tract symptoms: Secondary | ICD-10-CM

## 2020-11-17 NOTE — Telephone Encounter (Signed)
  Chronic Care Management   Note  11/17/2020 Name: Matthew Miles. MRN: [id number] DOB: June 29, 1928   Received message from patient's daughter, [name], that she had some questions to clarify his medication list. Milagros Reap. Left voicemail for her to return my call at her convenience.    Catie Darnelle Maffucci, PharmD, [place], [place] Clinical Pharmacist [employer] at [place]

## 2020-11-17 NOTE — Patient Instructions (Signed)
Visit Information  PATIENT GOALS:  Goals Addressed               This Visit's Progress     Patient Stated     Medication Monitoring (pt-stated)        Patient Goals/Self-Care Activities Over the next 90 days, patient will:  - take medications as prescribed check glucose daily, document, and provide at future appointments check blood pressure periodically, document, and provide at future appointments        Patient verbalizes understanding of instructions provided today and agrees to view in [place].   Plan: Telephone follow up appointment with care management team member scheduled for:  ~ 1 day as previously scheduled  Catie Darnelle Maffucci, PharmD, [place], [place] Clinical Pharmacist [employer] at [employer] [phone]

## 2020-11-17 NOTE — Chronic Care Management (AMB) (Signed)
Chronic Care Management Pharmacy Note  11/17/2020 Name:  Matthew Miles. MRN:  [id number] DOB:  07/19/1928  Subjective: Matthew Miles. is an 87 y.o. year old male who is a primary patient of Sonnenberg, [name], MD.  The CCM team was consulted for assistance with disease management and care coordination needs.    Engaged with patient by telephone for follow up visit in response to provider referral for pharmacy case management and/or care coordination services.   Consent to Services:  The patient was given information about Chronic Care Management services, agreed to services, and gave verbal consent prior to initiation of services.  Please see initial visit note for detailed documentation.   Patient Care Team: [name], MD as PCP - General (Family Medicine) Rockey Situ Kathlene November, MD as PCP - Cardiology (Cardiology) Minna Merritts, MD as Consulting Physician (Cardiology) De Hollingshead, RPH-CPP as Pharmacist (Pharmacist)  Recent office visits: 7/26 - PCP visit, continue current regimen, focus on diet modification and carbohydrate reduction  Recent consult visits: 6/22 - rituxan, continue valacyclovir 500 mg BID 7/28 - [facility] onc[facility] - continue to follow. Consider restarting therapy if PSA >3.6 8/1 - cardiology Gollan - reduce K to every other day,     Objective:  Lab Results  Component Value Date   CREATININE 1.37 (H) 05/29/2020   CREATININE 1.01 02/19/2020   CREATININE 1.27 12/30/2019    Lab Results  Component Value Date   HGBA1C 6.8 (H) 09/22/2020   Last diabetic Eye exam:  Lab Results  Component Value Date/Time   HMDIABEYEEXA Retinopathy (A) 06/12/2020 12:00 AM    Last diabetic Foot exam:  Lab Results  Component Value Date/Time   HMDIABFOOTEX Dr. Milinda Pointer 12/18/2013 12:00 AM        Component Value Date/Time   CHOL 125 11/03/2020 1122   TRIG 184.0 (H) 11/03/2020 1122   HDL 33.60 (L) 11/03/2020 1122   CHOLHDL 4 11/03/2020 1122   VLDL 36.8  11/03/2020 1122   LDLCALC 55 11/03/2020 1122   LDLDIRECT 80.0 08/05/2015 1405    Hepatic Function Latest Ref Rng & Units 05/29/2020 12/30/2019 10/29/2018  Total Protein 6.5 - 8.1 g/dL 6.8 6.2 5.9(L)  Albumin 3.5 - 5.0 g/dL 3.9 4.1 4.1  AST 15 - 41 U/L _0 ALT 0 - 44 U/L _1 Alk Phosphatase 38 - 126 U/L 73 93 79  Total Bilirubin 0.3 - 1.2 mg/dL 0.7 0.5 0.4  Bilirubin, Direct 0.0 - 0.2 mg/dL 0.1 - -    Lab Results  Component Value Date/Time   TSH 4.92 (H) 12/30/2019 02:16 PM   TSH 3.20 10/31/2013 10:51 AM    CBC Latest Ref Rng & Units 05/29/2020 12/30/2019 01/16/2019  WBC 4.0 - 10.5 K/uL 3.8(L) 4.1 5.3  Hemoglobin 13.0 - 17.0 g/dL 9.0(L) 11.2(L) 11.2(L)  Hematocrit 39.0 - 52.0 % 28.3(L) 33.9(L) 34.7(L)  Platelets 150 - 400 K/uL 74(L) 83.0(L) 100.0(L)    No results found for: VD25OH  Clinical ASCVD: No  The ASCVD Risk score [name], et al., 2013) failed to calculate for the following reasons:   The 2013 ASCVD risk score is only valid for ages 71 to 61     Social History   Tobacco Use  Smoking Status Former   Packs/day: 1.00   Years: 65.00   Pack years: 65.00   Types: Cigarettes   Quit date: 04/10/2011   Years since quitting: 9.6  Smokeless Tobacco Former   Types:  Chew   BP Readings from Last 3 Encounters:  11/09/20 (!) 120/48  11/03/20 120/60  05/29/20 120/62   Pulse Readings from Last 3 Encounters:  11/09/20 83  11/03/20 (!) 106  05/29/20 90   Wt Readings from Last 3 Encounters:  11/09/20 171 lb 8 oz (77.8 kg)  11/03/20 174 lb 3.2 oz (79 kg)  07/31/20 170 lb (77.1 kg)    Assessment: Review of patient past medical history, allergies, medications, health status, including review of consultants reports, laboratory and other test data, was performed as part of comprehensive evaluation and provision of chronic care management services.   SDOH:  (Social Determinants of Health) assessments and interventions performed:    CCM Care  Plan  Allergies  Allergen Reactions   Oxybutynin     Dry mouth and constant urination.   Sulfa Antibiotics     GI upset   Tradjenta [Linagliptin] Other (See Comments)    Hair loss    Medications Reviewed Today     Reviewed by De Hollingshead, RPH-CPP (Pharmacist) on 11/17/20 at 1155  Med List Status: <None>   Medication Order Taking? Sig Documenting Provider Last Dose Status Informant  Accu-Chek FastClix Lancets MISC [id number] Yes Inject 1 each into the skin in the morning and at bedtime. [provider] Taking Active   ACCU-CHEK GUIDE test strip [id number] Yes TEST BLOOD SUGAR TWICE DAILY AS DIRECTED [name] [name], MD Taking Active   alfuzosin (UROXATRAL) 10 MG 24 hr tablet [id number] Yes Take 1 tablet (10 mg total) by mouth daily with breakfast. [name], MD Taking Active   aspirin 81 MG tablet [id number] Yes Take 81 mg by mouth daily.  [provider] Taking Active Self  carvedilol (COREG) 6.25 MG tablet [id number] Yes TAKE 1 TABLET TWICE DAILY Gollan, Kathlene November, MD Taking Active   clotrimazole (LOTRIMIN) 1 % cream [id number] Yes Apply 1 application topically 2 (two) times daily. [name], MD Taking Active   Dulaglutide (TRULICITY) 1.5 SR/1.5XY Bon[facility] Aid [id number] Yes Inject 1.5 mg into the skin once a week. [provider] Taking Active   ferrous sulfate 325 (65 FE) MG EC tablet [id number] Yes Take 325 mg by mouth daily. [provider] Taking Active   metFORMIN (GLUCOPHAGE-XR) 500 MG 24 hr tablet [id number] Yes TAKE 2 TABLETS(1000 MG) BY MOUTH TWICE DAILY [name], MD Taking Active   mupirocin ointment (BACTROBAN) 2 % [id number] Yes 1 application 2 (two) times daily as needed. [provider] Taking Active   nitroGLYCERIN (NITROSTAT) 0.4 MG SL tablet [id number] No Place 1 tablet (0.4 mg total) under the tongue every 5 (five) minutes as needed.  Patient not taking: Reported on 11/17/2020   Minna Merritts, MD Not  Taking Active   pantoprazole (PROTONIX) 20 MG tablet [id number] Yes TAKE 1 TABLET EVERY DAY [name], MD Taking Active   potassium chloride (KLOR-CON) 10 MEQ tablet [id number] Yes Take 1 tablet (10 mEq total) by mouth every other day. Minna Merritts, MD Taking Active   pravastatin (PRAVACHOL) 40 MG tablet [id number] Yes Take 1 tablet (40 mg total) by mouth daily. Minna Merritts, MD Taking Active   PROAIR HFA 108 [phone] Base) MCG/ACT inhaler [id number] Yes INHALE 2 PUFFS INTO THE LUNGS EVERY 6 HOURS AS NEEDED FOR WHEEZING OR SHORTNESS OF BREATH [name], MD Taking Active   Tiotropium Bromide Monohydrate (SPIRIVA RESPIMAT) 2.5 MCG/ACT AERS [id number] Yes Inhale 2 puffs into the lungs daily. [name], MD  Taking Active   torsemide (DEMADEX) 10 MG tablet [id number] Yes TAKE 1 TABLET(10 MG) BY MOUTH TWICE DAILY AS NEEDED Gollan, Kathlene November, MD Taking Active   valACYclovir (VALTREX) 500 MG tablet [id number] Yes Take 500 mg by mouth 2 (two) times daily. [provider] Taking Active             Patient Active Problem List   Diagnosis Date Noted   History of COVID-19 07/31/2020   Shingles 07/31/2020   B12 deficiency 07/31/2020   COVID-19 04/06/2020   PAD (peripheral artery disease) ([place]) 01/09/2020   Chronic venous insufficiency 01/09/2020   Elevated TSH 01/01/2020   Decreased pedal pulses 01/01/2020   Lower urinary tract symptoms (LUTS) 06/28/2019   Dysuria 03/18/2019   Pressure ulcer 01/16/2019   Diabetic neuropathy ([place]) 12/31/2018   Diabetic retinopathy ([place]) 10/07/2018   Milk intolerance 07/18/2018   Monoclonal B-cell lymphocytosis 07/18/2018   Prostate cancer ([place]) 07/04/2018   BPH (benign prostatic hyperplasia) 02/10/2017   Left leg pain 10/13/2016   Falls 08/05/2016   Chest pain 07/30/2016   Hip pain, right 02/03/2016   Actinic keratoses 12/24/2015   Osteoarthritis of both hands 12/24/2015   Obesity (BMI 30-39.9) 10/21/2014   Essential  hypertension 07/21/2014   Murmur, cardiac 10/15/2013   Chronic low back pain 10/08/2013   Chronic diastolic CHF (congestive heart failure) ([place]) 04/15/2013   Thrombocytopenia ([place]) 08/21/2012   Urge incontinence 07/02/2012   Hyperlipidemia 06/08/2012   H/O malignant neoplasm of prostate 06/04/2012   Diabetes ([place]) 04/26/2012   Iron deficiency anemia 01/06/2012   Pulmonary hypertension ([place]) 11/25/2011   GERD (gastroesophageal reflux disease) 06/13/2011   Coronary artery disease of native artery of native heart with stable angina pectoris ([place]) 04/26/2011   Tachycardia 04/26/2011   COPD (chronic obstructive pulmonary disease) ([place])     Immunization History  Administered Date(s) Administered   Fluad Quad(high Dose 65+) 01/01/2020   Influenza Split 12/29/2011   Influenza, High Dose Seasonal PF 12/24/2015, 01/14/2017, 01/31/2018, 01/05/2019   Influenza,inj,Quad PF,6+ Mos 01/10/2013, 01/17/2014, 01/22/2015   Influenza-Unspecified 12/20/2011, 01/10/2013, 01/17/2014, 01/22/2015, 01/05/2019   PFIZER(Purple Top)SARS-COV-2 Vaccination 05/21/2019, 06/11/2019, 11/19/2019, 01/20/2020   Pneumococcal Conjugate-13 04/21/2014   Pneumococcal Polysaccharide-23 12/29/2011   Tdap 04/07/2011    Conditions to be addressed/monitored: HTN, HLD, and DMII  Care Plan : Medication Management  Updates made by De Hollingshead, RPH-CPP since 11/17/2020 12:00 AM     Problem: Diabetes, HTN, Prostate Ca      Long-Range Goal: Disease Progression Prevention   Start Date: 05/07/2020  This Visit's Progress: On track  Recent Progress: On track  Priority: High  Note:   Current Barriers:  Complex patient with multiple comorbidities increasing risk for hospitalization  Pharmacist Clinical Goal(s):  Over the next 90 days, patient will achieve adherence to monitoring guidelines and medication adherence to achieve therapeutic efficacy through collaboration with PharmD and provider.   Interventions: 1:1  collaboration with [name], MD regarding development and update of comprehensive plan of care as evidenced by provider attestation and co-signature Inter-disciplinary care team collaboration (see longitudinal plan of care) Comprehensive medication review performed; medication list updated in electronic medical record  Health Maintenance: Moving forward, evaluate appropriate timing for pneumococcal and second shingrix vaccination pending chemotherapy plan  Medication Management: Reviewed medications and list with [name] today. She requests that I mail an updated medication list to patient.   Diabetes: Controlled per last A1c; current treatment: metformin XR 0569 mg BID, Trulicity 1.5 mg weekly Hx Jardiance -  d/t d/t hypotension  Recommended to continue current regimen at this time.   Hypertension/HFpEF Appropriately managed; current treatment: carvedilol 6.25 mg BID, torsemide 20 mg (takes 2 10 mg QAM), potassium 20 mEq every other day; follows w/ [name] Reviewed to reduce potassium to every other day per last visit with [name] and elevated K >5  Recommended to continue periodic monitoring of home BP, along with collaboration with cardiology. Continue current regimen at this time  Hyperlipidemia: Controlled per last lipid panel; current treatment: pravastatin 40 mg daily   Antiplatelet regimen: aspirin 81 mg daily Recommended to continue current regimen at this itime  Chronic Obstructive Pulmonary Disease: Controlled per patient report; current treatment: Spiriva Respimat 2.5 mcg 2 puffs daily; albuterol HFA PRN -reports he has only needed when out in the yard working out GOLD Classification: B Previously recommended to continue current regimen at this time  GERD: Improved per patient report; current regimen: pantoprazole 20 mg QAM Previously recommended to continue current regimen at this time   Shingles (in setting of chemotherapy) Improving per patient report;  current regimen: valacyclovir 500 mg BID  Previously recommended to continue current regimen along with collaboration w/ hem/onc  Hx Prostate Cancer: PSA increased on last check. Recent visit with [facility]c - no evidence of disease progression  Current treatment for BPH symptoms: alfuzosin 10 mg daily Recommended to continue current regimen along with collaboration with Urology Hem/Onc Supplements: concerned about whether patient should be on calcium + vitamin D and what doses. No evidence of DEXA per chart review. Currently holding on dosing of Lupron, noted that they plan to re-initiate if PSA climbs to >3.6.  Contacted [facility] hem/onc to confirm if patient should be on calcium + vitamin D for bone strength/osteoporosis prevention in the setting of previous treatment with ADT therapy for prostate cancer. Left voicemail on nurse triage line, also asked if he needed a DEXA.  Received voicemail back. They note that patient should continue on calcium + vitamin D, but thought I said dexamethasone in my voicemail. Called back, left voicemail asking to clarify dosing (if anything other than calcium 1200 mg daily + Vitamin D [phone] units daily) and that I was asking about a DEXA bone scan. Notified patient's daughter.   Splenic Marginal Zone B-Cell Lymphoma: Follows with [facility]. Current regimen: rituximab (received Evusheld) Previously recommended to continue collaboration with oncology.  Supplements: Normocytic anemia on last CBC;  Ferrous sulfate 325 mg daily - patient has been taking once daily for several years. Updated script in chart today Reviewed that patient can discontinue Vitamin B12 supplementation given elevation on last check.    Patient Goals/Self-Care Activities Over the next 90 days, patient will:  - take medications as prescribed check glucose daily, document, and provide at future appointments check blood pressure periodically, document, and provide at future  appointments  Follow Up Plan: Telephone follow up appointment with care management team member scheduled for: ~ 2 days as previously scheduled      Medication Assistance: None required.  Patient affirms current coverage meets needs.  Patient's preferred pharmacy is:  [id number] [address], [address] [place] [address] Phone: [phone] Fax: [phone]  [facility] ([facility] - [address], [address] [place] [address] Phone: [phone] Fax: [phone]    Follow Up:  Patient agrees to Care Plan and Follow-up.  Plan: Telephone follow up appointment with care management team member scheduled for:  ~ 1 day as previously scheduled  Catie Darnelle Maffucci, PharmD, [place], [place] Clinical Pharmacist [employer] [facility] [employer] [phone]

## 2020-11-17 NOTE — Telephone Encounter (Signed)
Spoke with patient's daughter, [name]. See CCM documentation

## 2020-11-18 ENCOUNTER — Ambulatory Visit: Payer: Medicare HMO | Admitting: Pharmacist

## 2020-11-18 DIAGNOSIS — N401 Enlarged prostate with lower urinary tract symptoms: Secondary | ICD-10-CM

## 2020-11-18 DIAGNOSIS — C61 Malignant neoplasm of prostate: Secondary | ICD-10-CM

## 2020-11-18 DIAGNOSIS — E119 Type 2 diabetes mellitus without complications: Secondary | ICD-10-CM

## 2020-11-18 DIAGNOSIS — I5032 Chronic diastolic (congestive) heart failure: Secondary | ICD-10-CM

## 2020-11-18 DIAGNOSIS — I25118 Atherosclerotic heart disease of native coronary artery with other forms of angina pectoris: Secondary | ICD-10-CM

## 2020-11-18 DIAGNOSIS — J432 Centrilobular emphysema: Secondary | ICD-10-CM

## 2020-11-18 DIAGNOSIS — I10 Essential (primary) hypertension: Secondary | ICD-10-CM

## 2020-11-18 NOTE — Patient Instructions (Signed)
Matthew Miles,   Keep up the fantastic work! Keep an eye on those blood sugars and your portion sizes of carbohydrate foods. If your next A1c is >7%, we may consider increasing the dose of your Trulicity injection.   Call me with any questions or concerns! I'll see you before your appointment with Dr. [name] in October!  Catie Darnelle Maffucci, PharmD [phone]  Visit Information  PATIENT GOALS:  Goals Addressed               This Visit's Progress     Patient Stated     Medication Monitoring (pt-stated)        Patient Goals/Self-Care Activities Over the next 90 days, patient will:  - take medications as prescribed check glucose daily, document, and provide at future appointments check blood pressure periodically, document, and provide at future appointments         The patient verbalized understanding of instructions, educational materials, and care plan provided today and agreed to receive a mailed copy of patient instructions, educational materials, and care plan.   Plan: Face to Face appointment with care management team member scheduled for: 8 weeks  Catie Darnelle Maffucci, PharmD, [place], [place] Clinical Pharmacist [employer] at [employer] [phone]

## 2020-11-18 NOTE — Chronic Care Management (AMB) (Signed)
Chronic Care Management Pharmacy Note  11/18/2020 Name:  Matthew Miles. MRN:  [id number] DOB:  [birth year]  Subjective: Matthew Miles. is an 87 y.o. year old male who is a primary patient of Sonnenberg, [name], MD.  The CCM team was consulted for assistance with disease management and care coordination needs.    Engaged with patient by telephone for follow up visit in response to provider referral for pharmacy case management and/or care coordination services.   Consent to Services:  The patient was given information about Chronic Care Management services, agreed to services, and gave verbal consent prior to initiation of services.  Please see initial visit note for detailed documentation.   Patient Care Team: [name], MD as PCP - General (Family Medicine) Rockey Situ, Kathlene November, MD as PCP - Cardiology (Cardiology) Rockey Situ Kathlene November, MD as Consulting Physician (Cardiology) De Hollingshead, RPH-CPP as Pharmacist (Pharmacist)  Objective:  Lab Results  Component Value Date   CREATININE 1.37 (H) 05/29/2020   CREATININE 1.01 02/19/2020   CREATININE 1.27 12/30/2019    Lab Results  Component Value Date   HGBA1C 6.8 (H) 09/22/2020   Last diabetic Eye exam:  Lab Results  Component Value Date/Time   HMDIABEYEEXA Retinopathy (A) 06/12/2020 12:00 AM    Last diabetic Foot exam:  Lab Results  Component Value Date/Time   HMDIABFOOTEX Dr. Milinda Pointer 12/18/2013 12:00 AM        Component Value Date/Time   CHOL 125 11/03/2020 1122   TRIG 184.0 (H) 11/03/2020 1122   HDL 33.60 (L) 11/03/2020 1122   CHOLHDL 4 11/03/2020 1122   VLDL 36.8 11/03/2020 1122   LDLCALC 55 11/03/2020 1122   LDLDIRECT 80.0 08/05/2015 1405    Hepatic Function Latest Ref Rng & Units 05/29/2020 12/30/2019 10/29/2018  Total Protein 6.5 - 8.1 g/dL 6.8 6.2 5.9(L)  Albumin 3.5 - 5.0 g/dL 3.9 4.1 4.1  AST 15 - 41 U/L _0 ALT 0 - 44 U/L _1 Alk Phosphatase 38 - 126 U/L 73 93 79  Total  Bilirubin 0.3 - 1.2 mg/dL 0.7 0.5 0.4  Bilirubin, Direct 0.0 - 0.2 mg/dL 0.1 - -    Lab Results  Component Value Date/Time   TSH 4.92 (H) 12/30/2019 02:16 PM   TSH 3.20 10/31/2013 10:51 AM    CBC Latest Ref Rng & Units 05/29/2020 12/30/2019 01/16/2019  WBC 4.0 - 10.5 K/uL 3.8(L) 4.1 5.3  Hemoglobin 13.0 - 17.0 g/dL 9.0(L) 11.2(L) 11.2(L)  Hematocrit 39.0 - 52.0 % 28.3(L) 33.9(L) 34.7(L)  Platelets 150 - 400 K/uL 74(L) 83.0(L) 100.0(L)    No results found for: VD25OH  Clinical ASCVD: Yes  The ASCVD Risk score [name], et al., 2013) failed to calculate for the following reasons:   The 2013 ASCVD risk score is only valid for ages 4 to 55      Social History   Tobacco Use  Smoking Status Former   Packs/day: 1.00   Years: 65.00   Pack years: 65.00   Types: Cigarettes   Quit date: 04/10/2011   Years since quitting: 9.6  Smokeless Tobacco Former   Types: Chew   BP Readings from Last 3 Encounters:  11/09/20 (!) 120/48  11/03/20 120/60  05/29/20 120/62   Pulse Readings from Last 3 Encounters:  11/09/20 83  11/03/20 (!) 106  05/29/20 90   Wt Readings from Last 3 Encounters:  11/09/20 171 lb 8 oz (77.8 kg)  11/03/20 174 lb  3.2 oz (79 kg)  07/31/20 170 lb (77.1 kg)    Assessment: Review of patient past medical history, allergies, medications, health status, including review of consultants reports, laboratory and other test data, was performed as part of comprehensive evaluation and provision of chronic care management services.   SDOH:  (Social Determinants of Health) assessments and interventions performed:  SDOH Interventions    Flowsheet Row Most Recent Value  SDOH Interventions   Financial Strain Interventions Intervention Not Indicated       CCM Care Plan  Allergies  Allergen Reactions   Oxybutynin     Dry mouth and constant urination.   Sulfa Antibiotics     GI upset   Tradjenta [Linagliptin] Other (See Comments)    Hair loss    Medications  Reviewed Today     Reviewed by De Hollingshead, RPH-CPP (Pharmacist) on 11/18/20 at 0907  Med List Status: <None>   Medication Order Taking? Sig Documenting Provider Last Dose Status Informant  Accu-Chek FastClix Lancets MISC [id number] Yes Inject 1 each into the skin in the morning and at bedtime. [provider] Taking Active   ACCU-CHEK GUIDE test strip [id number] Yes TEST BLOOD SUGAR TWICE DAILY AS DIRECTED [name] [name], MD Taking Active   alfuzosin (UROXATRAL) 10 MG 24 hr tablet [id number] Yes Take 1 tablet (10 mg total) by mouth daily with breakfast. [name], MD Taking Active   aspirin 81 MG tablet [id number] Yes Take 81 mg by mouth daily.  [provider] Taking Active Self  Calcium Citrate-Vitamin D (CALCIUM CITRATE + D PO) [id number] Yes Take by mouth. 1200 mg of calcium and [phone] units of vitamin D daily [provider] Taking Active   carvedilol (COREG) 6.25 MG tablet [id number] Yes TAKE 1 TABLET TWICE DAILY Gollan, Kathlene November, MD Taking Active   clotrimazole (LOTRIMIN) 1 % cream [id number] Yes Apply 1 application topically 2 (two) times daily. [name], MD Taking Active   Dulaglutide (TRULICITY) 1.5 YF/7.4BS Bonney Aid [id number] Yes Inject 1.5 mg into the skin once a week. [provider] Taking Active   ferrous sulfate 325 (65 FE) MG EC tablet [id number] Yes Take 325 mg by mouth daily. [provider] Taking Active   metFORMIN (GLUCOPHAGE-XR) 500 MG 24 hr tablet [id number] Yes TAKE 2 TABLETS(1000 MG) BY MOUTH TWICE DAILY [name], MD Taking Active   mupirocin ointment (BACTROBAN) 2 % [id number] Yes 1 application 2 (two) times daily as needed. [provider] Taking Active   nitroGLYCERIN (NITROSTAT) 0.4 MG SL tablet [id number] No Place 1 tablet (0.4 mg total) under the tongue every 5 (five) minutes as needed.  Patient not taking: No sig reported   Minna Merritts, MD Not Taking Active   pantoprazole  (PROTONIX) 20 MG tablet [id number] Yes TAKE 1 TABLET EVERY DAY [name] [name], MD Taking Active   potassium chloride (KLOR-CON) 10 MEQ tablet [id number] Yes Take 1 tablet (10 mEq total) by mouth every other day. Minna Merritts, MD Taking Active   pravastatin (PRAVACHOL) 40 MG tablet [id number] Yes Take 1 tablet (40 mg total) by mouth daily. Minna Merritts, MD Taking Active   PROAIR HFA [phone] Base) MCG/ACT inhaler [id number] Yes INHALE 2 PUFFS INTO THE LUNGS EVERY 6 HOURS AS NEEDED FOR WHEEZING OR SHORTNESS OF BREATH [name], MD Taking Active   Tiotropium Bromide Monohydrate (SPIRIVA RESPIMAT) 2.5 MCG/ACT AERS [id number] Yes Inhale 2 puffs into the lungs daily. [name], MD Taking  Active   torsemide (DEMADEX) 10 MG tablet [id number] Yes TAKE 1 TABLET(10 MG) BY MOUTH TWICE DAILY AS NEEDED  Patient taking differently: Take 20 mg by mouth daily. Per Dr. Fransisco Hertz, Kathlene November, MD Taking Active   valACYclovir (VALTREX) 500 MG tablet [id number] Yes Take 500 mg by mouth 2 (two) times daily. [provider] Taking Active             Patient Active Problem List   Diagnosis Date Noted   History of COVID-19 07/31/2020   Shingles 07/31/2020   B12 deficiency 07/31/2020   COVID-19 04/06/2020   PAD (peripheral artery disease) ([place]) 01/09/2020   Chronic venous insufficiency 01/09/2020   Elevated TSH 01/01/2020   Decreased pedal pulses 01/01/2020   Lower urinary tract symptoms (LUTS) 06/28/2019   Dysuria 03/18/2019   Pressure ulcer 01/16/2019   Diabetic neuropathy ([place]) 12/31/2018   Diabetic retinopathy ([place]) 10/07/2018   Milk intolerance 07/18/2018   Monoclonal B-cell lymphocytosis 07/18/2018   Prostate cancer ([place]) 07/04/2018   BPH (benign prostatic hyperplasia) 02/10/2017   Left leg pain 10/13/2016   Falls 08/05/2016   Chest pain 07/30/2016   Hip pain, right 02/03/2016   Actinic keratoses 12/24/2015   Osteoarthritis of both hands 12/24/2015   Obesity  (BMI 30-39.9) 10/21/2014   Essential hypertension 07/21/2014   Murmur, cardiac 10/15/2013   Chronic low back pain 10/08/2013   Chronic diastolic CHF (congestive heart failure) ([place]) 04/15/2013   Thrombocytopenia ([place]) 08/21/2012   Urge incontinence 07/02/2012   Hyperlipidemia 06/08/2012   H/O malignant neoplasm of prostate 06/04/2012   Diabetes ([place]) 04/26/2012   Iron deficiency anemia 01/06/2012   Pulmonary hypertension ([place]) 11/25/2011   GERD (gastroesophageal reflux disease) 06/13/2011   Coronary artery disease of native artery of native heart with stable angina pectoris ([place]) 04/26/2011   Tachycardia 04/26/2011   COPD (chronic obstructive pulmonary disease) ([place])     Immunization History  Administered Date(s) Administered   Fluad Quad(high Dose 65+) 01/01/2020   Influenza Split 12/29/2011   Influenza, High Dose Seasonal PF 12/24/2015, 01/14/2017, 01/31/2018, 01/05/2019   Influenza,inj,Quad PF,6+ Mos 01/10/2013, 01/17/2014, 01/22/2015   Influenza-Unspecified 12/20/2011, 01/10/2013, 01/17/2014, 01/22/2015, 01/05/2019   PFIZER(Purple Top)SARS-COV-2 Vaccination 05/21/2019, 06/11/2019, 11/19/2019, 01/20/2020   Pneumococcal Conjugate-13 04/21/2014   Pneumococcal Polysaccharide-23 12/29/2011   Tdap 04/07/2011    Conditions to be addressed/monitored: CHF, HTN, HLD, and DMII  Care Plan : Medication Management  Updates made by De Hollingshead, RPH-CPP since 11/18/2020 12:00 AM     Problem: Diabetes, HTN, Prostate Ca      Long-Range Goal: Disease Progression Prevention   Start Date: 05/07/2020  This Visit's Progress: On track  Recent Progress: On track  Priority: High  Note:   Current Barriers:  Complex patient with multiple comorbidities increasing risk for hospitalization  Pharmacist Clinical Goal(s):  Over the next 90 days, patient will achieve adherence to monitoring guidelines and medication adherence to achieve therapeutic efficacy through collaboration with PharmD  and provider.   Interventions: 1:1 collaboration with [name], MD regarding development and update of comprehensive plan of care as evidenced by provider attestation and co-signature Inter-disciplinary care team collaboration (see longitudinal plan of care) Comprehensive medication review performed; medication list updated in electronic medical record  Health Maintenance: Up to date on eye exam, foot exam, UACR.  Continue to follow with hem/onc for vaccination recommendations in the setting of chemotherapy  Medication Management: Reviewed medications and list with patient today. Mailing updated medication list to him and  daughter. Advised to take list with him to [facility] to update their medication list.  SDOH: Looking forward to his 89th birthday. Planning a small family get together due to [place] numbers. Planning on going to [employer] for pizza.   Diabetes: Controlled per last A1c; current treatment: metformin XR 9675 mg BID, Trulicity 1.5 mg weekly Hx Jardiance - d/t d/t hypotension  Current glucose readings: fasting: 130-150 (higher if higher carbs the night before) Current meal patterns: trying to limit how much fruit he is eating, likes peaches right now. Trying to avoid bread at night. Breakfast: cereal, small amount of milk.  Extensive education on focus on moderation with portion sizes.  Recommended to continue current regimen at this time along with BP monitoring.   Hypertension/HFpEF Appropriately managed; current treatment: carvedilol 6.25 mg BID, torsemide 20 mg (takes 2 10 mg QAM), potassium 20 mEq every other day; follows w/ [name] Reviewed with patient to reduce potassium to every other day per last visit with [name] and elevated K >5  Home BP readings:  Recommended to continue periodic monitoring of home BP, along with collaboration with cardiology. Continue current regimen at this time  Hyperlipidemia: Controlled per last lipid panel; current treatment:  pravastatin 40 mg daily   Antiplatelet regimen: aspirin 81 mg daily Recommended to continue current regimen at this time  Chronic Obstructive Pulmonary Disease: Controlled per patient report; current treatment: Spiriva Respimat 2.5 mcg 2 puffs daily; albuterol HFA PRN -reports he has only needed when out in the yard, is not taking this regularly GOLD Classification: B Recommended to continue current regimen at this time  GERD: Improved per patient report; current regimen: pantoprazole 20 mg QAM Confirmed appropriate medication administration Recommended to continue current regimen at this time   Shingles (in setting of chemotherapy), prevention Appropriately managed; current regimen: valacyclovir 500 mg BID  Recommended to continue current regimen along with collaboration w/ hem/onc  Hx Prostate Cancer: PSA increased on last check, plan to hold on restarting ADT unless PSA is >3.6.  Current treatment for BPH symptoms: alfuzosin 10 mg daily Supplements: calcium + vitamin D for post-ADT bone health. Received call back from [facility]onc, they recommend standard dosing of calcium 1200 mg daily + vitamin D [phone] units daily, and note that PCP can order DEXA to evaluate bone health in the setting of post-ADT.  Will discuss w/ Dr. [name] for consideration moving forward.  Recommended to continue current regimen along with collaboration with Urology Hem/Onc.   Splenic Marginal Zone B-Cell Lymphoma: Follows with [facility]. Current regimen: rituximab infusions. Pre medicated with famotidine, APAP, diphenhydramine.  Patient notes he is feeling well overall. Wearing his mask all the time. Staying home a much as he can.  Recommended to continue collaboration with oncology.  Supplements: Ferrous sulfate 325 mg daily - patient has been taking once daily for several years. Continue current regimen  Patient Goals/Self-Care Activities Over the next 90 days, patient will:  - take medications  as prescribed check glucose daily, document, and provide at future appointments check blood pressure periodically, document, and provide at future appointments  Follow Up Plan: Telephone follow up appointment with care management team member scheduled for: ~ 8 weeks       Medication Assistance: None required.  Patient affirms current coverage meets needs.  Patient's preferred pharmacy is:  [id number] - [address], [place] AT [facility] OF [address] [place] [address]  Phone: [phone] Fax: [phone]  [facility] - [place], [address] [place] [address] Phone: [phone] Fax: [phone]   Follow Up:  Patient agrees to Care Plan and Follow-up.  Plan: Face to Face appointment with care management team member scheduled for: 8 weeks  Catie Darnelle Maffucci, PharmD, [employer], [place] Clinical Pharmacist [employer] at [employer] [phone]

## 2020-11-25 ENCOUNTER — Other Ambulatory Visit: Payer: Self-pay | Admitting: Family Medicine

## 2020-11-26 ENCOUNTER — Encounter: Payer: Self-pay | Admitting: *Deleted

## 2020-11-26 ENCOUNTER — Other Ambulatory Visit: Payer: Self-pay

## 2020-11-26 DIAGNOSIS — Z7984 Long term (current) use of oral hypoglycemic drugs: Secondary | ICD-10-CM | POA: Diagnosis not present

## 2020-11-26 DIAGNOSIS — I25119 Atherosclerotic heart disease of native coronary artery with unspecified angina pectoris: Secondary | ICD-10-CM | POA: Diagnosis not present

## 2020-11-26 DIAGNOSIS — Z794 Long term (current) use of insulin: Secondary | ICD-10-CM | POA: Insufficient documentation

## 2020-11-26 DIAGNOSIS — Z7951 Long term (current) use of inhaled steroids: Secondary | ICD-10-CM | POA: Diagnosis not present

## 2020-11-26 DIAGNOSIS — I5032 Chronic diastolic (congestive) heart failure: Secondary | ICD-10-CM | POA: Insufficient documentation

## 2020-11-26 DIAGNOSIS — Z8546 Personal history of malignant neoplasm of prostate: Secondary | ICD-10-CM | POA: Diagnosis not present

## 2020-11-26 DIAGNOSIS — S62346A Nondisplaced fracture of base of fifth metacarpal bone, right hand, initial encounter for closed fracture: Secondary | ICD-10-CM | POA: Insufficient documentation

## 2020-11-26 DIAGNOSIS — I110 Hypertensive heart disease with heart failure: Secondary | ICD-10-CM | POA: Insufficient documentation

## 2020-11-26 DIAGNOSIS — Z8616 Personal history of COVID-19: Secondary | ICD-10-CM | POA: Insufficient documentation

## 2020-11-26 DIAGNOSIS — E11319 Type 2 diabetes mellitus with unspecified diabetic retinopathy without macular edema: Secondary | ICD-10-CM | POA: Insufficient documentation

## 2020-11-26 DIAGNOSIS — W06XXXA Fall from bed, initial encounter: Secondary | ICD-10-CM | POA: Insufficient documentation

## 2020-11-26 DIAGNOSIS — S6991XA Unspecified injury of right wrist, hand and finger(s), initial encounter: Secondary | ICD-10-CM | POA: Diagnosis present

## 2020-11-26 DIAGNOSIS — B9689 Other specified bacterial agents as the cause of diseases classified elsewhere: Secondary | ICD-10-CM | POA: Diagnosis not present

## 2020-11-26 DIAGNOSIS — J449 Chronic obstructive pulmonary disease, unspecified: Secondary | ICD-10-CM | POA: Insufficient documentation

## 2020-11-26 DIAGNOSIS — Z87891 Personal history of nicotine dependence: Secondary | ICD-10-CM | POA: Diagnosis not present

## 2020-11-26 DIAGNOSIS — Z79899 Other long term (current) drug therapy: Secondary | ICD-10-CM | POA: Insufficient documentation

## 2020-11-26 DIAGNOSIS — Z7982 Long term (current) use of aspirin: Secondary | ICD-10-CM | POA: Diagnosis not present

## 2020-11-26 DIAGNOSIS — E1140 Type 2 diabetes mellitus with diabetic neuropathy, unspecified: Secondary | ICD-10-CM | POA: Insufficient documentation

## 2020-11-26 DIAGNOSIS — S62317A Displaced fracture of base of fifth metacarpal bone. left hand, initial encounter for closed fracture: Secondary | ICD-10-CM | POA: Diagnosis not present

## 2020-11-26 DIAGNOSIS — N390 Urinary tract infection, site not specified: Secondary | ICD-10-CM | POA: Diagnosis not present

## 2020-11-26 DIAGNOSIS — M7989 Other specified soft tissue disorders: Secondary | ICD-10-CM | POA: Diagnosis not present

## 2020-11-26 LAB — URINALYSIS, COMPLETE (UACMP) WITH MICROSCOPIC
Bacteria, UA: NONE SEEN
Bilirubin Urine: NEGATIVE
Glucose, UA: NEGATIVE mg/dL
Ketones, ur: NEGATIVE mg/dL
Nitrite: NEGATIVE
Protein, ur: NEGATIVE mg/dL
RBC / HPF: >50 RBC/hpf — ABNORMAL HIGH (ref 0–5)
Specific Gravity, Urine: 1.009 (ref 1.005–1.030)
Squamous Epithelial / HPF: NONE SEEN (ref 0–5)
pH: 5 (ref 5.0–8.0)

## 2020-11-26 LAB — CBC
HCT: 32.7 % — ABNORMAL LOW (ref 39.0–52.0)
Hemoglobin: 11.4 g/dL — ABNORMAL LOW (ref 13.0–17.0)
MCH: 33.8 pg (ref 26.0–34.0)
MCHC: 34.9 g/dL (ref 30.0–36.0)
MCV: 97 fL (ref 80.0–100.0)
Platelets: 118 10*3/uL — ABNORMAL LOW (ref 150–400)
RBC: 3.37 MIL/uL — ABNORMAL LOW (ref 4.22–5.81)
RDW: 14.5 % (ref 11.5–15.5)
WBC: 8.2 10*3/uL (ref 4.0–10.5)
nRBC: 0 % (ref 0.0–0.2)

## 2020-11-26 LAB — COMPREHENSIVE METABOLIC PANEL
ALT: 35 U/L (ref 0–44)
AST: 25 U/L (ref 15–41)
Albumin: 4.1 g/dL (ref 3.5–5.0)
Alkaline Phosphatase: 84 U/L (ref 38–126)
Anion gap: 8 (ref 5–15)
BUN: 25 mg/dL — ABNORMAL HIGH (ref 8–23)
CO2: 25 mmol/L (ref 22–32)
Calcium: 8.9 mg/dL (ref 8.9–10.3)
Chloride: 100 mmol/L (ref 98–111)
Creatinine, Ser: 1.2 mg/dL (ref 0.61–1.24)
GFR, Estimated: 58 mL/min — ABNORMAL LOW (ref >60)
Glucose, Bld: 169 mg/dL — ABNORMAL HIGH (ref 70–99)
Potassium: 4.6 mmol/L (ref 3.5–5.1)
Sodium: 133 mmol/L — ABNORMAL LOW (ref 135–145)
Total Bilirubin: 1.1 mg/dL (ref 0.3–1.2)
Total Protein: 6.7 g/dL (ref 6.5–8.1)

## 2020-11-26 LAB — LIPASE, BLOOD: Lipase: 22 U/L (ref 11–51)

## 2020-11-26 NOTE — ED Triage Notes (Signed)
Diarrhea since last night. Reports only urinating small amounts.   613 on bladder scan

## 2020-11-27 ENCOUNTER — Emergency Department: Payer: Medicare HMO

## 2020-11-27 ENCOUNTER — Emergency Department
Admission: EM | Admit: 2020-11-27 | Discharge: 2020-11-27 | Disposition: A | Discharge status: Home / Self Care | Payer: Medicare HMO | Attending: Emergency Medicine | Admitting: Emergency Medicine

## 2020-11-27 DIAGNOSIS — M7989 Other specified soft tissue disorders: Secondary | ICD-10-CM | POA: Diagnosis not present

## 2020-11-27 DIAGNOSIS — S62317A Displaced fracture of base of fifth metacarpal bone. left hand, initial encounter for closed fracture: Secondary | ICD-10-CM | POA: Diagnosis not present

## 2020-11-27 DIAGNOSIS — S62397A Other fracture of fifth metacarpal bone, left hand, initial encounter for closed fracture: Secondary | ICD-10-CM

## 2020-11-27 DIAGNOSIS — R339 Retention of urine, unspecified: Secondary | ICD-10-CM

## 2020-11-27 DIAGNOSIS — N390 Urinary tract infection, site not specified: Secondary | ICD-10-CM | POA: Diagnosis not present

## 2020-11-27 IMAGING — DX DG HAND COMPLETE 3+V*L*
3 series · 3 of 3 positions shown · non-contrast
Comparison: None.

CLINICAL DATA: Fall and swelling of the left hand. Patient unable
to extend the fifth digit.

EXAM:
LEFT HAND - COMPLETE 3+ VIEW

[hand ap]
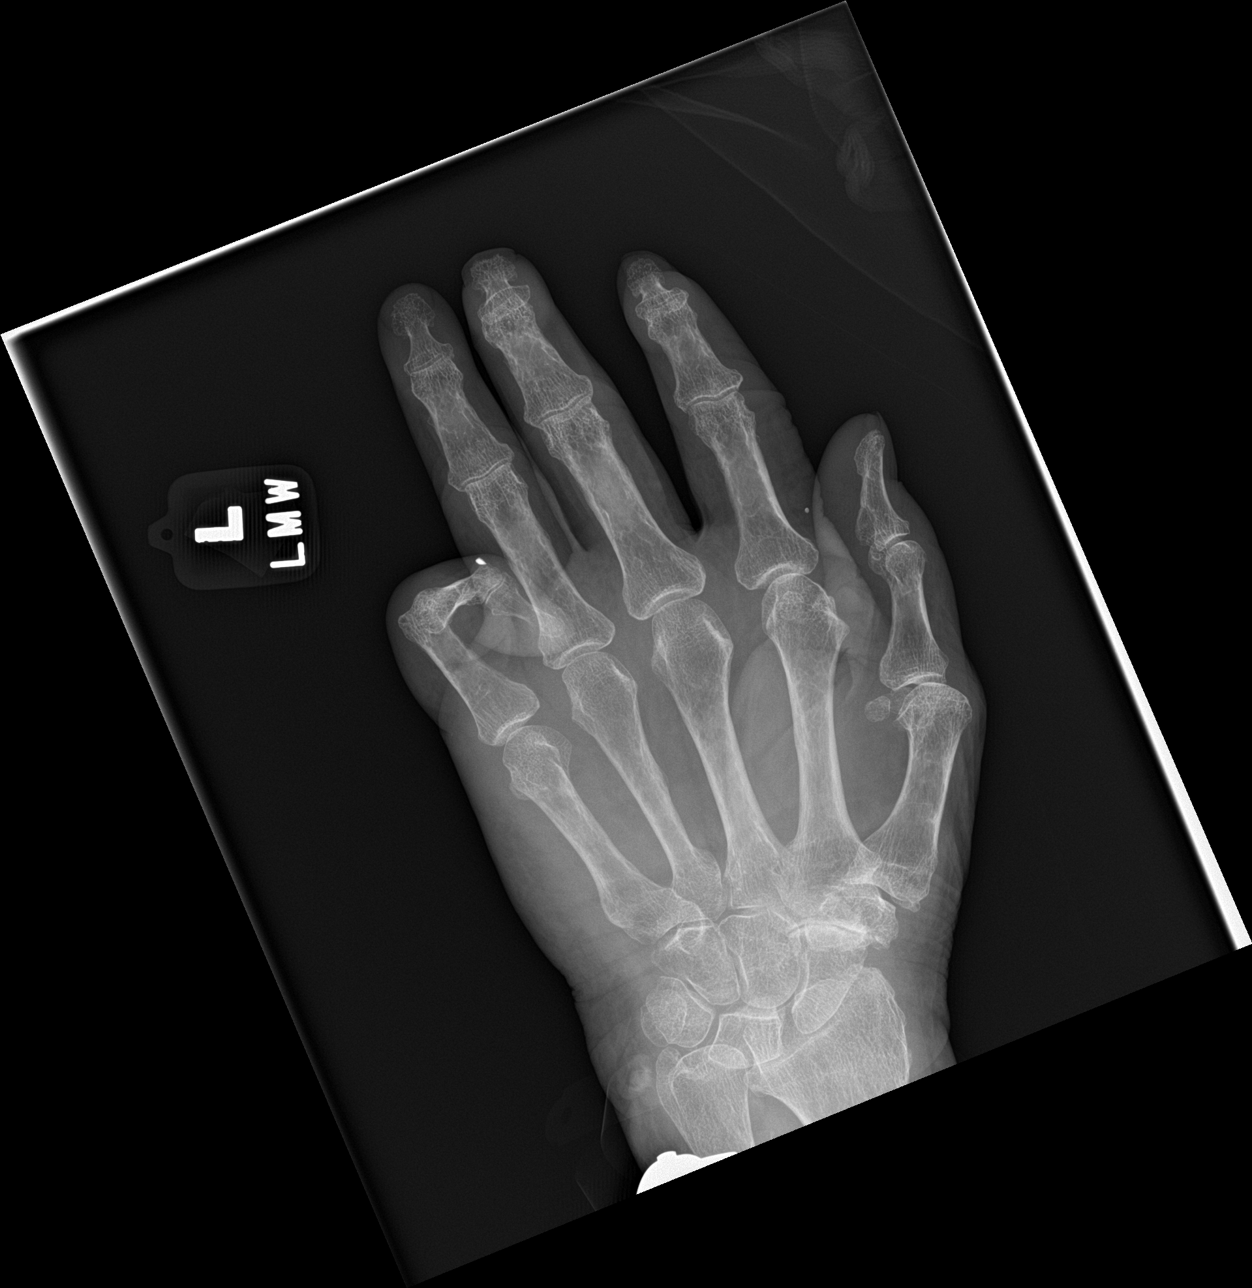

[hand obl]
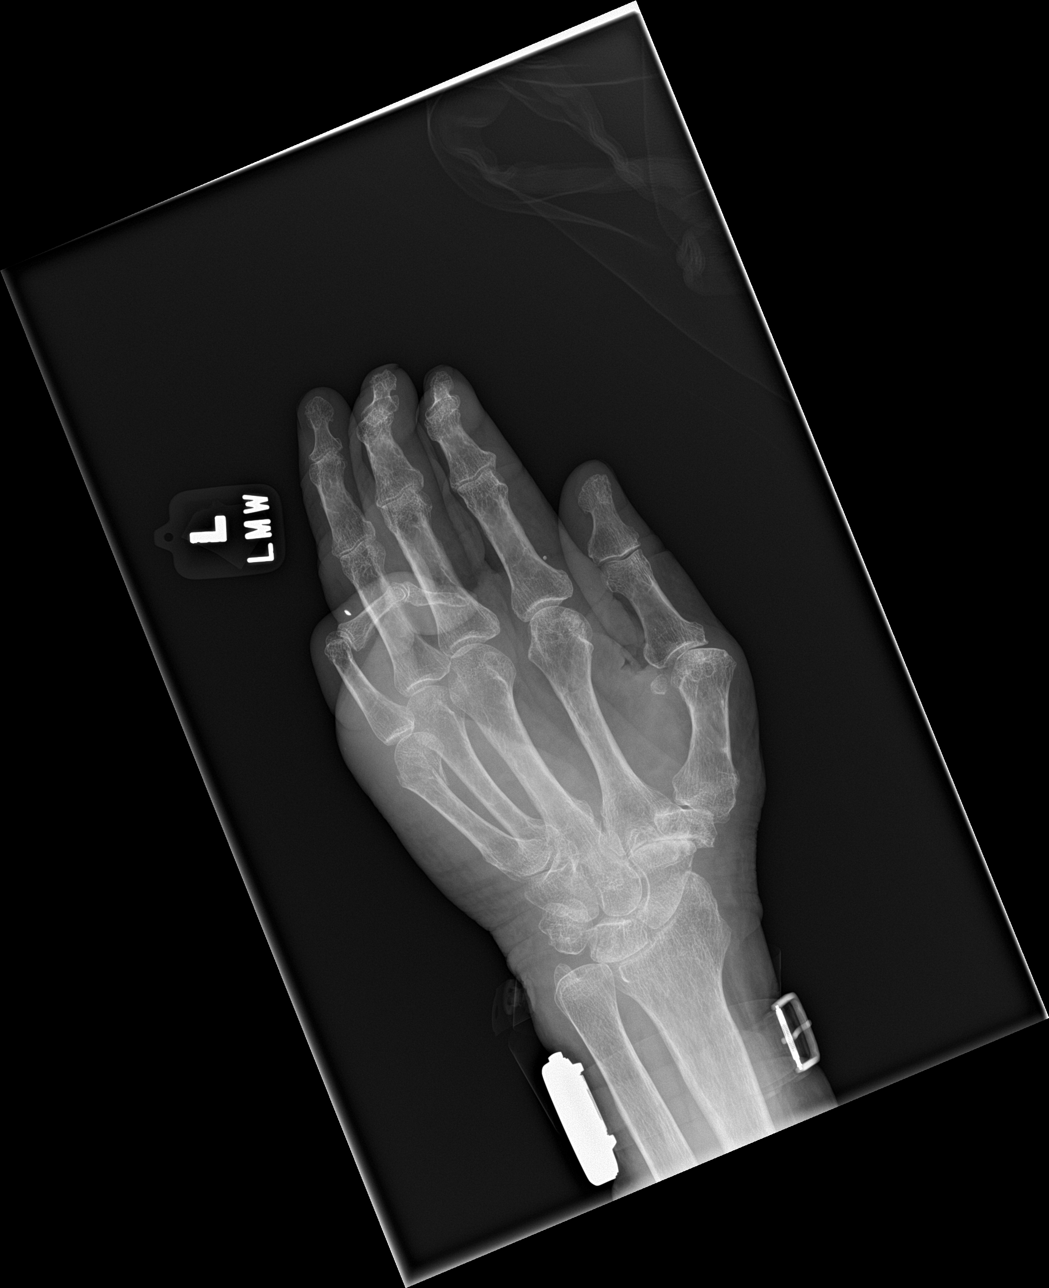

[hand lat]
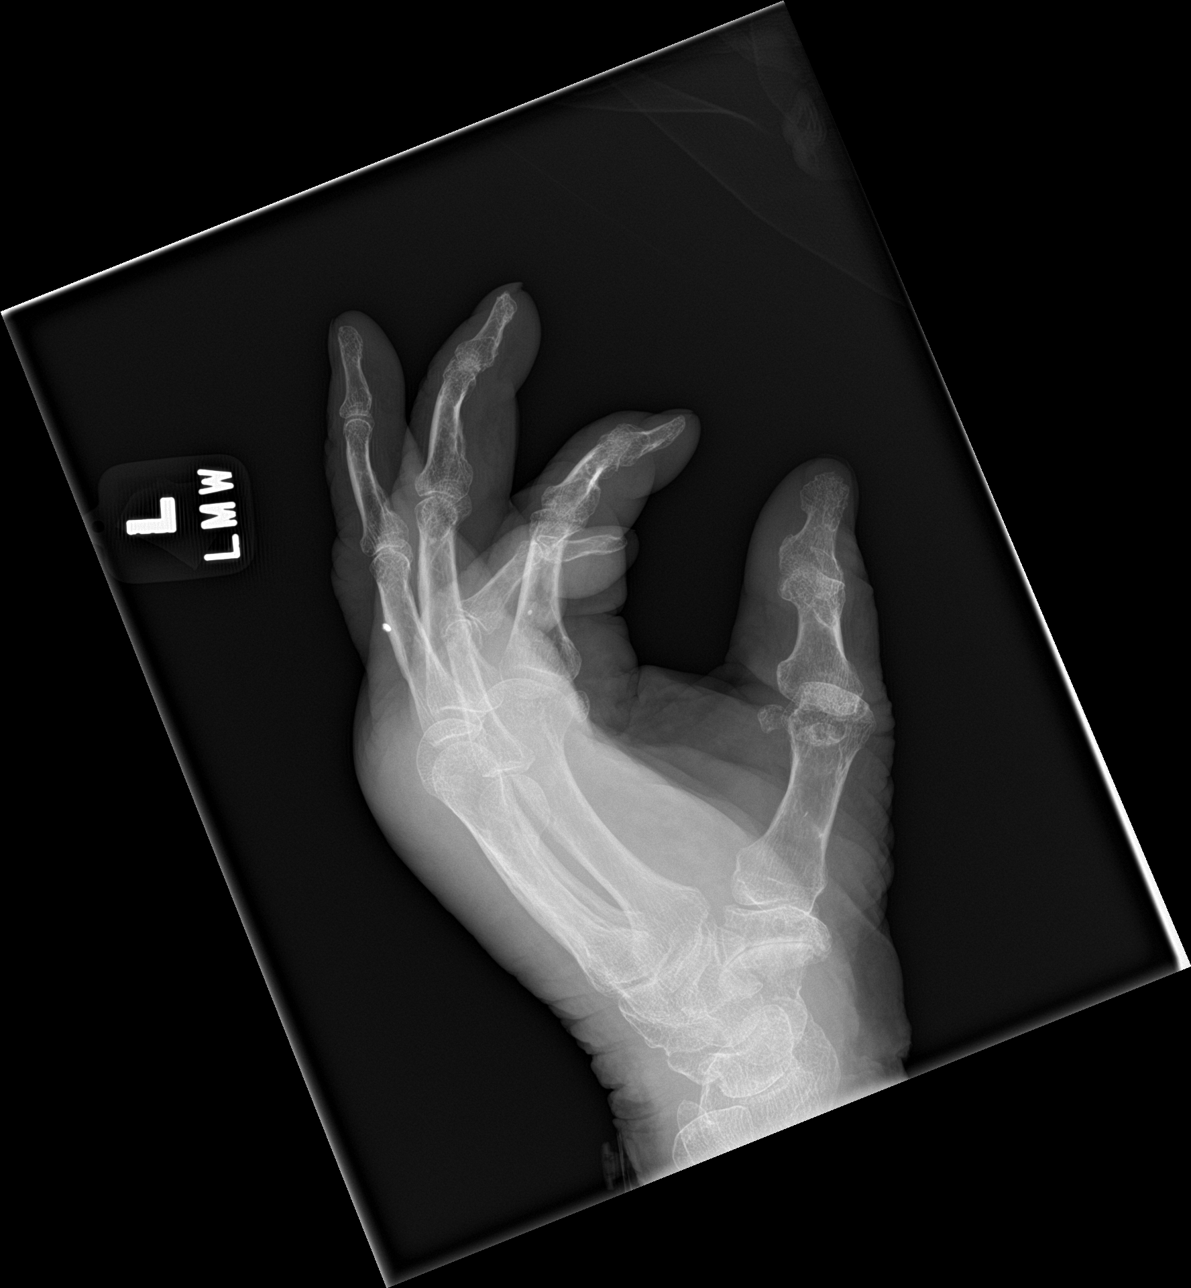

[3 of 3 positions shown; findings below may reference images not displayed]

FINDINGS: There is a mildly displaced fracture of the distal fifth metacarpal
with mild volar angulation of the distal fracture fragment. There is
no dislocation. The bones are osteopenic. Degenerative changes of
the interphalangeal joint and base of the thumb. There is soft
tissue swelling of the hand over the fifth ray. A 3 mm radiopaque
focus in the soft tissues of the fourth or fifth digit may be
chronic. Clinical correlation is recommended to exclude retained
foreign fragments.
IMPRESSION: 1. Mildly displaced fracture of the distal fifth metacarpal with
mild volar angulation of the distal fracture fragment. No
dislocation.
2. Osteopenia.

## 2020-11-27 MED ORDER — CEPHALEXIN 500 MG PO CAPS
500.0000 mg | ORAL_CAPSULE | Freq: Once | ORAL | Status: AC
  Administered 2020-11-27: 500 mg via ORAL
  Filled 2020-11-27: qty 1

## 2020-11-27 MED ORDER — CEPHALEXIN 500 MG PO CAPS: 500.0000 mg | ORAL_CAPSULE | Freq: Three times a day (TID) | ORAL | 0 refills | Status: DC

## 2020-11-27 NOTE — Discharge Instructions (Addendum)
1.  Take antibiotic as prescribed (Keflex 500 mg 3 times daily x7 days). 2.  Keep splint clean and dry. 3.  Return to the ER for worsening symptoms, persistent vomiting, difficulty breathing, fever or other concerns.

## 2020-11-27 NOTE — ED Provider Notes (Signed)
[facility] Emergency Department Provider Note   ____________________________________________   Event Date/Time   First MD Initiated Contact with Patient 11/27/20 0220     (approximate)  I have reviewed the triage vital signs and the nursing notes.   HISTORY  Chief Complaint Urinary retention    HPI Matthew Miles. is a 87 y.o. male who presents to the ED from home with a chief complaint of urinary retention.  On Tuesday, patient was stung by bees.  That evening he took a Benadryl and fell asleep on the side of his bed.  He fell face forward striking his face and left hand.  Denies LOC.  Reports difficulty urinating and emptying his bladder since.  History of prostate cancer status postradiation therapy.  Several loose stools last night.  Denies fever, cough, chest pain, shortness of breath, nausea, vomiting.  Thinks he takes a blood thinner ("625 mg)" in addition to aspirin but I personally reviewed patient's medication list, 11/09/2020 office visit and do not see anticoagulant listed.     Past Medical History:  Diagnosis Date   Allergy    Arthritis    CHF (congestive heart failure) (HCC)    Chicken pox    Cholecystitis    Colon polyps    COPD (chronic obstructive pulmonary disease) (HCC)    Coronary artery disease    Diabetes mellitus without complication (HCC)    Emphysema of lung (HCC)    GERD (gastroesophageal reflux disease)    Heart murmur    Hematemesis/vomiting blood 04/10/11   Hypercholesterolemia    Mild hypertension    Pancreatitis    Prostate cancer ([place])    Prostate cancer ([place])    radiation therapy    Smoker    Ulcer    Upper GI bleed 1980    Patient Active Problem List   Diagnosis Date Noted   History of COVID-19 07/31/2020   Shingles 07/31/2020   B12 deficiency 07/31/2020   COVID-19 04/06/2020   PAD (peripheral artery disease) ([place]) 01/09/2020   Chronic venous insufficiency 01/09/2020   Elevated TSH 01/01/2020    Decreased pedal pulses 01/01/2020   Lower urinary tract symptoms (LUTS) 06/28/2019   Dysuria 03/18/2019   Pressure ulcer 01/16/2019   Diabetic neuropathy ([place]) 12/31/2018   Diabetic retinopathy ([place]) 10/07/2018   Milk intolerance 07/18/2018   Monoclonal B-cell lymphocytosis 07/18/2018   Prostate cancer ([place]) 07/04/2018   BPH (benign prostatic hyperplasia) 02/10/2017   Left leg pain 10/13/2016   Falls 08/05/2016   Chest pain 07/30/2016   Hip pain, right 02/03/2016   Actinic keratoses 12/24/2015   Osteoarthritis of both hands 12/24/2015   Obesity (BMI 30-39.9) 10/21/2014   Essential hypertension 07/21/2014   Murmur, cardiac 10/15/2013   Chronic low back pain 10/08/2013   Chronic diastolic CHF (congestive heart failure) ([place]) 04/15/2013   Thrombocytopenia ([place]) 08/21/2012   Urge incontinence 07/02/2012   Hyperlipidemia 06/08/2012   H/O malignant neoplasm of prostate 06/04/2012   Diabetes ([place]) 04/26/2012   Iron deficiency anemia 01/06/2012   Pulmonary hypertension ([place]) 11/25/2011   GERD (gastroesophageal reflux disease) 06/13/2011   Coronary artery disease of native artery of native heart with stable angina pectoris ([place]) 04/26/2011   Tachycardia 04/26/2011   COPD (chronic obstructive pulmonary disease) [facility])     Past Surgical History:  Procedure Laterality Date   APPENDECTOMY     CARDIAC CATHETERIZATION  May 2002 and Feb 2013   [facility]; no stents    CATARACT EXTRACTION  CHOLECYSTECTOMY     CIRCUMCISION     COLONOSCOPY     HEMORRHOID SURGERY     SP CHOLECYSTOMY     STOMACH SURGERY     bleeding ulcers, followed by Dr. [name]    Prior to Admission medications   Medication Sig Start Date End Date Taking? Authorizing Provider  cephALEXin (KEFLEX) 500 MG capsule Take 1 capsule (500 mg total) by mouth 3 (three) times daily. 11/27/20  Yes Paulette Blanch, MD  Accu-Chek FastClix Lancets MISC Inject 1 each into the skin in the morning and at bedtime.    [provider]   ACCU-CHEK GUIDE test strip TEST BLOOD SUGAR TWICE DAILY AS DIRECTED 06/18/20   [name], MD  alfuzosin (UROXATRAL) 10 MG 24 hr tablet Take 1 tablet (10 mg total) by mouth daily with breakfast. 08/27/19   [name], MD  aspirin 81 MG tablet Take 81 mg by mouth daily.     [provider]  Calcium Citrate-Vitamin D (CALCIUM CITRATE + D PO) Take by mouth. 1200 mg of calcium and [phone] units of vitamin D daily    [provider]  carvedilol (COREG) 6.25 MG tablet TAKE 1 TABLET TWICE DAILY 11/09/20   Minna Merritts, MD  clotrimazole (LOTRIMIN) 1 % cream Apply 1 application topically 2 (two) times daily. 03/20/19   [name], MD  Dulaglutide (TRULICITY) 1.5 0000000 SOPN Inject 1.5 mg into the skin once a week.    [provider]  ferrous sulfate 325 (65 FE) MG EC tablet Take 325 mg by mouth daily.    [provider]  metFORMIN (GLUCOPHAGE-XR) 500 MG 24 hr tablet TAKE 2 TABLETS(1000 MG) BY MOUTH TWICE DAILY 09/21/20   [name], MD  mupirocin ointment (BACTROBAN) 2 % 1 application 2 (two) times daily as needed.    [provider]  nitroGLYCERIN (NITROSTAT) 0.4 MG SL tablet Place 1 tablet (0.4 mg total) under the tongue every 5 (five) minutes as needed. Patient not taking: No sig reported 11/09/20   Minna Merritts, MD  pantoprazole (PROTONIX) 20 MG tablet TAKE 1 TABLET EVERY DAY 07/15/20   [name], MD  potassium chloride (KLOR-CON) 10 MEQ tablet Take 1 tablet (10 mEq total) by mouth every other day. 11/09/20   Minna Merritts, MD  pravastatin (PRAVACHOL) 40 MG tablet Take 1 tablet (40 mg total) by mouth daily. 11/09/20   Minna Merritts, MD  PROAIR HFA 108 [phone] Base) MCG/ACT inhaler INHALE 2 PUFFS INTO THE LUNGS EVERY 6 HOURS AS NEEDED FOR WHEEZING OR SHORTNESS OF BREATH 10/03/16   [name], MD  Tiotropium Bromide Monohydrate (SPIRIVA RESPIMAT) 2.5 MCG/ACT AERS Inhale 2 puffs into the lungs daily. 04/01/20    [name], MD  torsemide (DEMADEX) 10 MG tablet TAKE 1 TABLET(10 MG) BY MOUTH TWICE DAILY AS NEEDED Patient taking differently: Take 20 mg by mouth daily. Per [name] 11/09/20   Minna Merritts, MD  valACYclovir (VALTREX) 500 MG tablet Take 500 mg by mouth 2 (two) times daily.    [provider]    Allergies Oxybutynin, Sulfa antibiotics, and Tradjenta [linagliptin]  Family History  Problem Relation Age of Onset   Prostate cancer Father    Liver cancer Brother    Prostate cancer Brother    Emphysema Sister        smoker    Social History Social History   Tobacco Use   Smoking status: Former  Packs/day: 1.00    Years: 65.00    Pack years: 65.00    Types: Cigarettes    Quit date: 04/10/2011    Years since quitting: 9.6   Smokeless tobacco: Former    Types: Nurse, children's Use: Former  Substance Use Topics   Alcohol use: Yes    Comment: 1 beer rarely   Drug use: No    Review of Systems  Constitutional: No fever/chills Eyes: No visual changes. ENT: No sore throat. Cardiovascular: Denies chest pain. Respiratory: Denies shortness of breath. Gastrointestinal: No abdominal pain.  No nausea, no vomiting.  No diarrhea.  No constipation. Genitourinary: Positive for urinary retention.  Negative for dysuria. Musculoskeletal: Positive for right hand swelling.  Negative for back pain. Skin: Negative for rash. Neurological: Negative for headaches, focal weakness or numbness.   ____________________________________________   PHYSICAL EXAM:  VITAL SIGNS: ED Triage Vitals [11/26/20 1905]  Enc Vitals Group     BP 137/69     Pulse Rate 86     Resp 18     Temp 97.9 F (36.6 C)     Temp Source Oral     SpO2 95 %     Weight      Height      Head Circumference      Peak Flow      Pain Score 10     Pain Loc      Pain Edu?      Excl. in [place]?    Examined after Foley insertion: Constitutional: Alert and oriented. Well appearing and in no  acute distress. Eyes: Conjunctivae are normal. PERRL. EOMI. Head: Old abrasions to nose and lips. Nose: No congestion/rhinnorhea. Mouth/Throat: Mucous membranes are moist.  No dental malocclusion. Neck: No stridor.  No cervical spine tenderness to palpation. Cardiovascular: Normal rate, regular rhythm. Grossly normal heart sounds.  Good peripheral circulation. Respiratory: Normal respiratory effort.  No retractions. Lungs CTAB. Gastrointestinal: Soft and nontender to light or deep palpation. No distention. No abdominal bruits. No CVA tenderness. Musculoskeletal:  Left dorsal hand swollen.  Limited range of motion 4th and 5th digits to close fist.  2+ radial pulse.  Brisk, less than 5-second capillary refill. No lower extremity tenderness nor edema.  No joint effusions. Neurologic: Alert and oriented x3.  CN II to XII grossly intact.  Normal speech and language. No gross focal neurologic deficits are appreciated. No gait instability. Skin:  Skin is warm, dry and intact. No rash noted. Psychiatric: Mood and affect are normal. Speech and behavior are normal.  ____________________________________________   LABS (all labs ordered are listed, but only abnormal results are displayed)  Labs Reviewed  COMPREHENSIVE METABOLIC PANEL - Abnormal; Notable for the following components:      Result Value   Sodium 133 (*)    Glucose, Bld 169 (*)    BUN 25 (*)    GFR, Estimated 58 (*)    All other components within normal limits  CBC - Abnormal; Notable for the following components:   RBC 3.37 (*)    Hemoglobin 11.4 (*)    HCT 32.7 (*)    Platelets 118 (*)    All other components within normal limits  URINALYSIS, COMPLETE (UACMP) WITH MICROSCOPIC - Abnormal; Notable for the following components:   Color, Urine YELLOW (*)    APPearance CLEAR (*)    Hgb urine dipstick LARGE (*)    Leukocytes,Ua MODERATE (*)    RBC / HPF >50 (*)  All other components within normal limits  URINE CULTURE   LIPASE, BLOOD   ____________________________________________  EKG  None ____________________________________________  RADIOLOGY I, Alayjah Boehringer J, personally viewed and evaluated these images (plain radiographs) as part of my medical decision making, as well as reviewing the written report by the radiologist.  ED MD interpretation: Mildly displaced distal fifth metacarpal fracture  Official radiology report(s): DG Hand Complete Left  Result Date: 11/27/2020 CLINICAL DATA:  Fall and swelling of the left hand. Patient unable to extend the fifth digit. EXAM: LEFT HAND - COMPLETE 3+ VIEW COMPARISON:  None. FINDINGS: There is a mildly displaced fracture of the distal fifth metacarpal with mild volar angulation of the distal fracture fragment. There is no dislocation. The bones are osteopenic. Degenerative changes of the interphalangeal joint and base of the thumb. There is soft tissue swelling of the hand over the fifth ray. A 3 mm radiopaque focus in the soft tissues of the fourth or fifth digit may be chronic. Clinical correlation is recommended to exclude retained foreign fragments. IMPRESSION: 1. Mildly displaced fracture of the distal fifth metacarpal with mild volar angulation of the distal fracture fragment. No dislocation. 2. Osteopenia. Electronically Signed   By: Anner Crete M.D.   On: 11/27/2020 03:10    ____________________________________________   PROCEDURES  Procedure(s) performed (including Critical Care):  Procedures   ____________________________________________   INITIAL IMPRESSION / ASSESSMENT AND PLAN / ED COURSE  As part of my medical decision making, I reviewed the following data within the [place] History obtained from family, Nursing notes reviewed and incorporated, Labs reviewed, Old chart reviewed, Radiograph reviewed, and Notes from prior ED visits     87 year old male presenting with urinary retention; also with left hand swelling  status post fall. Differential diagnosis includes, but is not limited to, acute appendicitis, renal colic, testicular torsion, urinary tract infection/pyelonephritis, prostatitis,  epididymitis, diverticulitis, small bowel obstruction or ileus, colitis, abdominal aortic aneurysm, gastroenteritis, hernia, etc.   Laboratory results remarkable for moderate leukocytes, 21-50 WBCs in urine.  We will add urine culture, start Keflex.  Advise keeping Foley catheter in place and follow-up with urology.  Will obtain left hand x-ray given recent fall.  Clinical Course as of 11/27/20 0553  Fri Nov 27, 2020  0345 Updated patient and son on x-ray result.  Will place hand in splint and referred to orthopedics for follow-up.  Will discharge home on antibiotics and Foley catheter with urology follow-up.  Strict return precautions given.  Both verbalized understanding and agree with plan of care. [JS]    Clinical Course User Index [JS] Paulette Blanch, MD     ____________________________________________   FINAL CLINICAL IMPRESSION(S) / ED DIAGNOSES  Final diagnoses:  Urinary retention  Urinary tract infection without hematuria, site unspecified  Closed nondisplaced fracture of other part of fifth metacarpal bone of left hand, initial encounter     ED Discharge Orders          Ordered    cephALEXin (KEFLEX) 500 MG capsule  3 times daily        11/27/20 0348             Note:  This document was prepared using Dragon voice recognition software and may include unintentional dictation errors.    Paulette Blanch, MD 11/27/20 [phone]

## 2020-11-28 LAB — URINE CULTURE: Culture: 10000 — AB

## 2020-11-30 ENCOUNTER — Telehealth: Payer: Medicare HMO

## 2020-12-01 ENCOUNTER — Ambulatory Visit: Payer: Medicare HMO | Admitting: Pharmacist

## 2020-12-01 DIAGNOSIS — I10 Essential (primary) hypertension: Secondary | ICD-10-CM

## 2020-12-01 DIAGNOSIS — E119 Type 2 diabetes mellitus without complications: Secondary | ICD-10-CM

## 2020-12-01 DIAGNOSIS — S62337A Displaced fracture of neck of fifth metacarpal bone, left hand, initial encounter for closed fracture: Secondary | ICD-10-CM | POA: Diagnosis not present

## 2020-12-01 DIAGNOSIS — N401 Enlarged prostate with lower urinary tract symptoms: Secondary | ICD-10-CM

## 2020-12-01 DIAGNOSIS — I739 Peripheral vascular disease, unspecified: Secondary | ICD-10-CM

## 2020-12-01 NOTE — Patient Instructions (Signed)
Let me know if you have any other questions or concerns! Our scheduler will reach out to schedule a call with our nurse case manager, Sheppard Evens.   Thanks!  Catie Darnelle Maffucci, PharmD [phone]  Visit Information  PATIENT GOALS:  Goals Addressed               This Visit's Progress     Patient Stated     Medication Monitoring (pt-stated)        Patient Goals/Self-Care Activities Over the next 90 days, patient will:  - take medications as prescribed check glucose daily, document, and provide at future appointments check blood pressure periodically, document, and provide at future appointments        Print copy of patient instructions, educational materials, and care plan provided in person.  Plan: Telephone follow up appointment with care management team member scheduled for:  ~6 weeks as previously scheduled  Catie Darnelle Maffucci, PharmD, [place], [place] Clinical Pharmacist [employer] at [facility] [phone]

## 2020-12-01 NOTE — Chronic Care Management (AMB) (Signed)
Chronic Care Management Pharmacy Note  12/01/2020 Name:  Matthew Miles. MRN:  [id number] DOB:  05/31/1928   Subjective: Matthew Miles. is an 87 y.o. year old male who is a primary patient of Sonnenberg, [name], MD.  The CCM team was consulted for assistance with disease management and care coordination needs.    Engaged with patient's daughter, [name] by telephone for  medication question  in response to provider referral for pharmacy case management and/or care coordination services.   Consent to Services:  The patient was given information about Chronic Care Management services, agreed to services, and gave verbal consent prior to initiation of services.  Please see initial visit note for detailed documentation.   Patient Care Team: [name], MD as PCP - General (Family Medicine) Minna Merritts, MD as PCP - Cardiology (Cardiology) Minna Merritts, MD as Consulting Physician (Cardiology) De Hollingshead, RPH-CPP as Pharmacist (Pharmacist)   Objective:  Lab Results  Component Value Date   CREATININE 1.20 11/26/2020   CREATININE 1.37 (H) 05/29/2020   CREATININE 1.01 02/19/2020    Lab Results  Component Value Date   HGBA1C 6.8 (H) 09/22/2020   Last diabetic Eye exam:  Lab Results  Component Value Date/Time   HMDIABEYEEXA Retinopathy (A) 06/12/2020 12:00 AM    Last diabetic Foot exam:  Lab Results  Component Value Date/Time   HMDIABFOOTEX Dr. Milinda Pointer 12/18/2013 12:00 AM        Component Value Date/Time   CHOL 125 11/03/2020 1122   TRIG 184.0 (H) 11/03/2020 1122   HDL 33.60 (L) 11/03/2020 1122   CHOLHDL 4 11/03/2020 1122   VLDL 36.8 11/03/2020 1122   LDLCALC 55 11/03/2020 1122   LDLDIRECT 80.0 08/05/2015 1405    Hepatic Function Latest Ref Rng & Units 11/26/2020 05/29/2020 12/30/2019  Total Protein 6.5 - 8.1 g/dL 6.7 6.8 6.2  Albumin 3.5 - 5.0 g/dL 4.1 3.9 4.1  AST 15 - 41 U/L _0 ALT 0 - 44 U/L 35 14 12  Alk Phosphatase 38 - 126  U/L 84 73 93  Total Bilirubin 0.3 - 1.2 mg/dL 1.1 0.7 0.5  Bilirubin, Direct 0.0 - 0.2 mg/dL - 0.1 -    Lab Results  Component Value Date/Time   TSH 4.92 (H) 12/30/2019 02:16 PM   TSH 3.20 10/31/2013 10:51 AM    CBC Latest Ref Rng & Units 11/26/2020 05/29/2020 12/30/2019  WBC 4.0 - 10.5 K/uL 8.2 3.8(L) 4.1  Hemoglobin 13.0 - 17.0 g/dL 11.4(L) 9.0(L) 11.2(L)  Hematocrit 39.0 - 52.0 % 32.7(L) 28.3(L) 33.9(L)  Platelets 150 - 400 K/uL 118(L) 74(L) 83.0(L)    No results found for: VD25OH  Clinical ASCVD: Yes  The ASCVD Risk score [name], et al., 2013) failed to calculate for the following reasons:   The 2013 ASCVD risk score is only valid for ages 60 to 80    Social History   Tobacco Use  Smoking Status Former   Packs/day: 1.00   Years: 65.00   Pack years: 65.00   Types: Cigarettes   Quit date: 04/10/2011   Years since quitting: 9.6  Smokeless Tobacco Former   Types: Chew   BP Readings from Last 3 Encounters:  11/27/20 (!) 143/74  11/09/20 (!) 120/48  11/03/20 120/60   Pulse Readings from Last 3 Encounters:  11/27/20 87  11/09/20 83  11/03/20 (!) 106   Wt Readings from Last 3 Encounters:  11/09/20 171 lb 8 oz (77.8 kg)  11/03/20 174 lb 3.2 oz (79 kg)  07/31/20 170 lb (77.1 kg)    Assessment: Review of patient past medical history, allergies, medications, health status, including review of consultants reports, laboratory and other test data, was performed as part of comprehensive evaluation and provision of chronic care management services.   SDOH:  (Social Determinants of Health) assessments and interventions performed:    CCM Care Plan  Allergies  Allergen Reactions   Oxybutynin     Dry mouth and constant urination.   Sulfa Antibiotics     GI upset   Tradjenta [Linagliptin] Other (See Comments)    Hair loss    Medications Reviewed Today     Reviewed by De Hollingshead, RPH-CPP (Pharmacist) on 11/18/20 at 0907  Med List Status: <None>    Medication Order Taking? Sig Documenting Provider Last Dose Status Informant  Accu-Chek FastClix Lancets MISC [id number] Yes Inject 1 each into the skin in the morning and at bedtime. [provider] Taking Active   ACCU-CHEK GUIDE test strip [id number] Yes TEST BLOOD SUGAR TWICE DAILY AS DIRECTED [name] [name], MD Taking Active   alfuzosin (UROXATRAL) 10 MG 24 hr tablet [id number] Yes Take 1 tablet (10 mg total) by mouth daily with breakfast. [name], MD Taking Active   aspirin 81 MG tablet [id number] Yes Take 81 mg by mouth daily.  [provider] Taking Active Self  Calcium Citrate-Vitamin D (CALCIUM CITRATE + D PO) [id number] Yes Take by mouth. 1200 mg of calcium and [phone] units of vitamin D daily [provider] Taking Active   carvedilol (COREG) 6.25 MG tablet [id number] Yes TAKE 1 TABLET TWICE DAILY Gollan, Kathlene November, MD Taking Active   clotrimazole (LOTRIMIN) 1 % cream [id number] Yes Apply 1 application topically 2 (two) times daily. [name], MD Taking Active   Dulaglutide (TRULICITY) 1.5 JA/2.5KN Bonney Aid [id number] Yes Inject 1.5 mg into the skin once a week. [provider] Taking Active   ferrous sulfate 325 (65 FE) MG EC tablet [id number] Yes Take 325 mg by mouth daily. [provider] Taking Active   metFORMIN (GLUCOPHAGE-XR) 500 MG 24 hr tablet [id number] Yes TAKE 2 TABLETS(1000 MG) BY MOUTH TWICE DAILY [name], MD Taking Active   mupirocin ointment (BACTROBAN) 2 % [id number] Yes 1 application 2 (two) times daily as needed. [provider] Taking Active   nitroGLYCERIN (NITROSTAT) 0.4 MG SL tablet [id number] No Place 1 tablet (0.4 mg total) under the tongue every 5 (five) minutes as needed.  Patient not taking: No sig reported   Minna Merritts, MD Not Taking Active   pantoprazole (PROTONIX) 20 MG tablet [id number] Yes TAKE 1 TABLET EVERY DAY [name] [name], MD Taking Active   potassium chloride  (KLOR-CON) 10 MEQ tablet [id number] Yes Take 1 tablet (10 mEq total) by mouth every other day. Minna Merritts, MD Taking Active   pravastatin (PRAVACHOL) 40 MG tablet [id number] Yes Take 1 tablet (40 mg total) by mouth daily. Minna Merritts, MD Taking Active   PROAIR HFA 108 [phone] Base) MCG/ACT inhaler [id number] Yes INHALE 2 PUFFS INTO THE LUNGS EVERY 6 HOURS AS NEEDED FOR WHEEZING OR SHORTNESS OF BREATH [name], MD Taking Active   Tiotropium Bromide Monohydrate (SPIRIVA RESPIMAT) 2.5 MCG/ACT AERS [id number] Yes Inhale 2 puffs into the lungs daily. [name], MD Taking Active   torsemide (DEMADEX) 10 MG tablet [id number] Yes TAKE 1 TABLET(10 MG) BY MOUTH TWICE DAILY AS NEEDED  Patient  taking differently: Take 20 mg by mouth daily. Per Dr. Fransisco Hertz, Kathlene November, MD Taking Active   valACYclovir (VALTREX) 500 MG tablet [id number] Yes Take 500 mg by mouth 2 (two) times daily. [provider] Taking Active             Patient Active Problem List   Diagnosis Date Noted   History of COVID-19 07/31/2020   Shingles 07/31/2020   B12 deficiency 07/31/2020   COVID-19 04/06/2020   PAD (peripheral artery disease) ([place]) 01/09/2020   Chronic venous insufficiency 01/09/2020   Elevated TSH 01/01/2020   Decreased pedal pulses 01/01/2020   Lower urinary tract symptoms (LUTS) 06/28/2019   Dysuria 03/18/2019   Pressure ulcer 01/16/2019   Diabetic neuropathy ([place]) 12/31/2018   Diabetic retinopathy ([place]) 10/07/2018   Milk intolerance 07/18/2018   Monoclonal B-cell lymphocytosis 07/18/2018   Prostate cancer ([place]) 07/04/2018   BPH (benign prostatic hyperplasia) 02/10/2017   Left leg pain 10/13/2016   Falls 08/05/2016   Chest pain 07/30/2016   Hip pain, right 02/03/2016   Actinic keratoses 12/24/2015   Osteoarthritis of both hands 12/24/2015   Obesity (BMI 30-39.9) 10/21/2014   Essential hypertension 07/21/2014   Murmur, cardiac 10/15/2013   Chronic low back pain  10/08/2013   Chronic diastolic CHF (congestive heart failure) ([place]) 04/15/2013   Thrombocytopenia ([place]) 08/21/2012   Urge incontinence 07/02/2012   Hyperlipidemia 06/08/2012   H/O malignant neoplasm of prostate 06/04/2012   Diabetes ([place]) 04/26/2012   Iron deficiency anemia 01/06/2012   Pulmonary hypertension ([place]) 11/25/2011   GERD (gastroesophageal reflux disease) 06/13/2011   Coronary artery disease of native artery of native heart with stable angina pectoris ([place]) 04/26/2011   Tachycardia 04/26/2011   COPD (chronic obstructive pulmonary disease) ([place])     Immunization History  Administered Date(s) Administered   Fluad Quad(high Dose 65+) 01/01/2020   Influenza Split 12/29/2011   Influenza, High Dose Seasonal PF 12/24/2015, 01/14/2017, 01/31/2018, 01/05/2019   Influenza,inj,Quad PF,6+ Mos 01/10/2013, 01/17/2014, 01/22/2015   Influenza-Unspecified 12/20/2011, 01/10/2013, 01/17/2014, 01/22/2015, 01/05/2019   PFIZER(Purple Top)SARS-COV-2 Vaccination 05/21/2019, 06/11/2019, 11/19/2019, 01/20/2020   Pneumococcal Conjugate-13 04/21/2014   Pneumococcal Polysaccharide-23 12/29/2011   Tdap 04/07/2011    Conditions to be addressed/monitored: HTN and DMII  Care Plan : Medication Management  Updates made by De Hollingshead, RPH-CPP since 12/01/2020 12:00 AM     Problem: Diabetes, HTN, Prostate Ca      Long-Range Goal: Disease Progression Prevention   Start Date: 05/07/2020  This Visit's Progress: On track  Recent Progress: On track  Priority: High  Note:   Current Barriers:  Complex patient with multiple comorbidities increasing risk for hospitalization  Pharmacist Clinical Goal(s):  Over the next 90 days, patient will achieve adherence to monitoring guidelines and medication adherence to achieve therapeutic efficacy through collaboration with PharmD and provider.   Interventions: 1:1 collaboration with [name], MD regarding development and update of  comprehensive plan of care as evidenced by provider attestation and co-signature Inter-disciplinary care team collaboration (see longitudinal plan of care) Comprehensive medication review performed; medication list updated in electronic medical record  Health Maintenance: Up to date on eye exam, foot exam, UACR.  Continue to follow with hem/onc for vaccination recommendations in the setting of chemotherapy  Acute Needs: S/p wasp sting, took a diphenhydramine and fell asleep. Fell off the side of the bed. Hit his face, his hand. Going to hand specialist today. Foley catheter still in place, per chart review, collaborating with upcoming [facility]  appointments for catheter removal. [name] calls today to ask for a printed updated medication list to take to this appointment.  Also requests information about mobility aids, like a walker. Discussed role of RN CM. Will collaborate w/ RN CM to support patient and his family with care needs.   Diabetes: Controlled per last A1c; current treatment: metformin XR 2637 mg BID, Trulicity 1.5 mg weekly Hx Jardiance - d/t d/t hypotension  Previously recommended to continue current regimen at this time along with BG monitoring.   Hypertension/HFpEF Appropriately managed; current treatment: carvedilol 6.25 mg BID, torsemide 20 mg (takes 2 10 mg QAM), potassium 20 mEq every other day; follows w/ [name] Previously recommended to continue current regimen at this time along with BGP monitoring  Hyperlipidemia: Controlled per last lipid panel; current treatment: pravastatin 40 mg daily   Antiplatelet regimen: aspirin 81 mg daily Previously recommended to continue current regimen at this time  Chronic Obstructive Pulmonary Disease: Controlled per patient report; current treatment: Spiriva Respimat 2.5 mcg 2 puffs daily; albuterol HFA PRN -reports he has only needed when out in the yard, is not taking this regularly GOLD Classification: B Previously recommended to  continue current regimen at this time  GERD: Improved per patient report; current regimen: pantoprazole 20 mg QAM Confirmed appropriate medication administration Previously recommended to continue current regimen at this time   Shingles (in setting of chemotherapy), prevention Appropriately managed; current regimen: valacyclovir 500 mg BID  Previously recommended to continue current regimen along with collaboration w/ hem/onc  Hx Prostate Cancer: PSA increased on last check, plan to hold on restarting ADT unless PSA is >3.6.  Current treatment for BPH symptoms: alfuzosin 10 mg daily Supplements: calcium + vitamin D for post-ADT bone health. Consider DEXA moving forward.  Previously recommended to continue current regimen along with collaboration with Urology Hem/Onc.   Splenic Marginal Zone B-Cell Lymphoma: Follows with [facility]. Current regimen: rituximab infusions. Pre medicated with famotidine, APAP, diphenhydramine.  Previously recommended to continue collaboration with oncology.  Supplements: Ferrous sulfate 325 mg daily - patient has been taking once daily for several years. Continue current regimen  Patient Goals/Self-Care Activities Over the next 90 days, patient will:  - take medications as prescribed check glucose daily, document, and provide at future appointments check blood pressure periodically, document, and provide at future appointments  Follow Up Plan: Telephone follow up appointment with care management team member scheduled for: ~ 6 weeks as previously scheduled      Medication Assistance: None required.  Patient affirms current coverage meets needs.  Patient's preferred pharmacy is:  [id number] [address], [place] [address] [place] [address] Phone: [phone] Fax: [phone]  [facility] - [place], [address] [place] [address] Phone: [phone] Fax: [phone]    Follow Up:  Patient agrees to Care Plan and Follow-up.  Plan: Telephone follow up appointment with care management team member scheduled for:  ~6 weeks as previously scheduled  Catie Darnelle Maffucci, PharmD, [place], [place] Clinical Pharmacist [employer] at [facility] [phone]

## 2020-12-02 ENCOUNTER — Telehealth: Payer: Self-pay | Admitting: Cardiovascular Disease

## 2020-12-02 DIAGNOSIS — D649 Anemia, unspecified: Secondary | ICD-10-CM | POA: Diagnosis not present

## 2020-12-02 DIAGNOSIS — Z5112 Encounter for antineoplastic immunotherapy: Secondary | ICD-10-CM | POA: Diagnosis not present

## 2020-12-02 DIAGNOSIS — C8307 Small cell B-cell lymphoma, spleen: Secondary | ICD-10-CM | POA: Diagnosis not present

## 2020-12-02 DIAGNOSIS — R161 Splenomegaly, not elsewhere classified: Secondary | ICD-10-CM | POA: Diagnosis not present

## 2020-12-02 DIAGNOSIS — C61 Malignant neoplasm of prostate: Secondary | ICD-10-CM | POA: Diagnosis not present

## 2020-12-02 DIAGNOSIS — Z87891 Personal history of nicotine dependence: Secondary | ICD-10-CM | POA: Diagnosis not present

## 2020-12-02 DIAGNOSIS — R591 Generalized enlarged lymph nodes: Secondary | ICD-10-CM | POA: Diagnosis not present

## 2020-12-02 DIAGNOSIS — D696 Thrombocytopenia, unspecified: Secondary | ICD-10-CM | POA: Diagnosis not present

## 2020-12-02 NOTE — Telephone Encounter (Signed)
Was able to return call to Mr. Waid daughter [name], she was concern if pt needed to stay on his fluid pill since he currently has a foley catheter in d/t UTI. Advised to continue low dose torsemide 10 mg BID as torsemide is pulling the excess fluids syntactically throughout his body as the foley is only emptying his bladder to reduce urine back-up to reduce possible worsening infection. [name] reports understanding.  [name] also reports oncology RN at [facility] advised Bp was 110 SBP during infusion, pt did take benadryl before infusion d/t side affects. Advised BP 110 SBP is to be suspected, BP can reduce during infusions and if taking benadryl. Would monitor for SBP <100.   Pt infusion is only once a month, suggested not taking BP meds in the am before infusion if taking benadryl, could take BP meds with pt to appt and have on hand if BP trends up or can also take later in the day on infusion days if need. [name] "likes that idea, I think that is what we will do as daddy has a history of low BP and sometimes the nurses don't know his history". Agreed with [name], advised to monitor BP and to call for any further concerns.  [name] did report Mr. Isaza did get stung by multiple yellow jackets a few days ago and been taking Tylenol and benadryl, she is currently monitoring pt, seems to be in good spirits, advised for worsening condition to seek urgent care for further treatment, also monitor BP and HR while on antihistamine.  [name] reports understanding.   Otherwise all questions or concerns were address and no additional concerns at this time. Agreeable to plan, will call back for anything further.

## 2020-12-02 NOTE — Telephone Encounter (Signed)
Patient daughter calling  Has questions regarding fluid pill and BP since starting infusion Please call to discuss

## 2020-12-04 ENCOUNTER — Other Ambulatory Visit: Payer: Self-pay | Admitting: Family Medicine

## 2020-12-08 DIAGNOSIS — C61 Malignant neoplasm of prostate: Secondary | ICD-10-CM | POA: Diagnosis not present

## 2020-12-08 DIAGNOSIS — Z79818 Long term (current) use of other agents affecting estrogen receptors and estrogen levels: Secondary | ICD-10-CM | POA: Diagnosis not present

## 2020-12-08 DIAGNOSIS — C8307 Small cell B-cell lymphoma, spleen: Secondary | ICD-10-CM | POA: Diagnosis not present

## 2020-12-08 DIAGNOSIS — R339 Retention of urine, unspecified: Secondary | ICD-10-CM | POA: Diagnosis not present

## 2020-12-08 DIAGNOSIS — Z466 Encounter for fitting and adjustment of urinary device: Secondary | ICD-10-CM | POA: Diagnosis not present

## 2020-12-09 DIAGNOSIS — J432 Centrilobular emphysema: Secondary | ICD-10-CM | POA: Diagnosis not present

## 2020-12-09 DIAGNOSIS — N401 Enlarged prostate with lower urinary tract symptoms: Secondary | ICD-10-CM

## 2020-12-09 DIAGNOSIS — E119 Type 2 diabetes mellitus without complications: Secondary | ICD-10-CM

## 2020-12-09 DIAGNOSIS — I5032 Chronic diastolic (congestive) heart failure: Secondary | ICD-10-CM | POA: Diagnosis not present

## 2020-12-09 DIAGNOSIS — C61 Malignant neoplasm of prostate: Secondary | ICD-10-CM

## 2020-12-09 DIAGNOSIS — I25118 Atherosclerotic heart disease of native coronary artery with other forms of angina pectoris: Secondary | ICD-10-CM | POA: Diagnosis not present

## 2020-12-09 DIAGNOSIS — I10 Essential (primary) hypertension: Secondary | ICD-10-CM

## 2020-12-12 ENCOUNTER — Other Ambulatory Visit: Payer: Self-pay | Admitting: Family Medicine

## 2020-12-16 ENCOUNTER — Telehealth: Payer: Self-pay

## 2020-12-16 NOTE — Telephone Encounter (Signed)
Pt's daughter called and had to reschedule his appt with Dr [name] from 02/05/21 to 02/15/21. He also had an appt with you that day. Please reschedule.

## 2020-12-29 DIAGNOSIS — S62337D Displaced fracture of neck of fifth metacarpal bone, left hand, subsequent encounter for fracture with routine healing: Secondary | ICD-10-CM | POA: Diagnosis not present

## 2021-02-02 DIAGNOSIS — Z923 Personal history of irradiation: Secondary | ICD-10-CM | POA: Diagnosis not present

## 2021-02-02 DIAGNOSIS — R339 Retention of urine, unspecified: Secondary | ICD-10-CM | POA: Diagnosis not present

## 2021-02-02 DIAGNOSIS — C61 Malignant neoplasm of prostate: Secondary | ICD-10-CM | POA: Diagnosis not present

## 2021-02-02 DIAGNOSIS — I739 Peripheral vascular disease, unspecified: Secondary | ICD-10-CM | POA: Diagnosis not present

## 2021-02-02 DIAGNOSIS — N3941 Urge incontinence: Secondary | ICD-10-CM | POA: Diagnosis not present

## 2021-02-02 DIAGNOSIS — Z7984 Long term (current) use of oral hypoglycemic drugs: Secondary | ICD-10-CM | POA: Diagnosis not present

## 2021-02-02 DIAGNOSIS — Z87891 Personal history of nicotine dependence: Secondary | ICD-10-CM | POA: Diagnosis not present

## 2021-02-02 DIAGNOSIS — Z7985 Long-term (current) use of injectable non-insulin antidiabetic drugs: Secondary | ICD-10-CM | POA: Diagnosis not present

## 2021-02-04 ENCOUNTER — Telehealth: Payer: Medicare HMO

## 2021-02-05 ENCOUNTER — Ambulatory Visit: Payer: Medicare HMO

## 2021-02-05 ENCOUNTER — Ambulatory Visit: Payer: Medicare HMO | Admitting: Family Medicine

## 2021-02-05 DIAGNOSIS — Z8546 Personal history of malignant neoplasm of prostate: Secondary | ICD-10-CM | POA: Diagnosis not present

## 2021-02-05 DIAGNOSIS — Z923 Personal history of irradiation: Secondary | ICD-10-CM | POA: Diagnosis not present

## 2021-02-05 DIAGNOSIS — Z87891 Personal history of nicotine dependence: Secondary | ICD-10-CM | POA: Diagnosis not present

## 2021-02-05 DIAGNOSIS — D649 Anemia, unspecified: Secondary | ICD-10-CM | POA: Diagnosis not present

## 2021-02-05 DIAGNOSIS — R161 Splenomegaly, not elsewhere classified: Secondary | ICD-10-CM | POA: Diagnosis not present

## 2021-02-05 DIAGNOSIS — Z5112 Encounter for antineoplastic immunotherapy: Secondary | ICD-10-CM | POA: Diagnosis not present

## 2021-02-05 DIAGNOSIS — C8307 Small cell B-cell lymphoma, spleen: Secondary | ICD-10-CM | POA: Diagnosis not present

## 2021-02-05 DIAGNOSIS — D696 Thrombocytopenia, unspecified: Secondary | ICD-10-CM | POA: Diagnosis not present

## 2021-02-05 DIAGNOSIS — J449 Chronic obstructive pulmonary disease, unspecified: Secondary | ICD-10-CM | POA: Diagnosis not present

## 2021-02-08 DIAGNOSIS — R5383 Other fatigue: Secondary | ICD-10-CM | POA: Diagnosis not present

## 2021-02-08 DIAGNOSIS — Z79899 Other long term (current) drug therapy: Secondary | ICD-10-CM | POA: Diagnosis not present

## 2021-02-08 DIAGNOSIS — C61 Malignant neoplasm of prostate: Secondary | ICD-10-CM | POA: Diagnosis not present

## 2021-02-08 DIAGNOSIS — R399 Unspecified symptoms and signs involving the genitourinary system: Secondary | ICD-10-CM | POA: Diagnosis not present

## 2021-02-08 DIAGNOSIS — R972 Elevated prostate specific antigen [PSA]: Secondary | ICD-10-CM | POA: Diagnosis not present

## 2021-02-08 DIAGNOSIS — Z79818 Long term (current) use of other agents affecting estrogen receptors and estrogen levels: Secondary | ICD-10-CM | POA: Diagnosis not present

## 2021-02-15 ENCOUNTER — Encounter: Payer: Self-pay | Admitting: Family Medicine

## 2021-02-15 ENCOUNTER — Ambulatory Visit (INDEPENDENT_AMBULATORY_CARE_PROVIDER_SITE_OTHER): Payer: Medicare HMO | Admitting: Family Medicine

## 2021-02-15 ENCOUNTER — Other Ambulatory Visit: Payer: Self-pay

## 2021-02-15 VITALS — BP 110/70 | HR 89 | Temp 98.4°F | Ht 65.0 in | Wt 174.6 lb

## 2021-02-15 DIAGNOSIS — R7989 Other specified abnormal findings of blood chemistry: Secondary | ICD-10-CM

## 2021-02-15 DIAGNOSIS — M85842 Other specified disorders of bone density and structure, left hand: Secondary | ICD-10-CM | POA: Diagnosis not present

## 2021-02-15 DIAGNOSIS — E119 Type 2 diabetes mellitus without complications: Secondary | ICD-10-CM

## 2021-02-15 DIAGNOSIS — I10 Essential (primary) hypertension: Secondary | ICD-10-CM

## 2021-02-15 DIAGNOSIS — N401 Enlarged prostate with lower urinary tract symptoms: Secondary | ICD-10-CM | POA: Diagnosis not present

## 2021-02-15 DIAGNOSIS — M8580 Other specified disorders of bone density and structure, unspecified site: Secondary | ICD-10-CM | POA: Insufficient documentation

## 2021-02-15 LAB — TSH: TSH: 6.44 u[IU]/mL — ABNORMAL HIGH (ref 0.35–5.50)

## 2021-02-15 LAB — HEMOGLOBIN A1C: Hgb A1c MFr Bld: 6.7 % — ABNORMAL HIGH (ref 4.6–6.5)

## 2021-02-15 LAB — VITAMIN D 25 HYDROXY (VIT D DEFICIENCY, FRACTURES): VITD: 20.65 ng/mL — ABNORMAL LOW (ref 30.00–100.00)

## 2021-02-15 LAB — MICROALBUMIN / CREATININE URINE RATIO
Creatinine,U: 23 mg/dL
Microalb Creat Ratio: 4.5 mg/g (ref 0.0–30.0)
Microalb, Ur: 1 mg/dL (ref 0.0–1.9)

## 2021-02-15 MED ORDER — BLOOD GLUCOSE MONITOR KIT: PACK | 0 refills | Status: DC

## 2021-02-15 NOTE — Assessment & Plan Note (Signed)
We will have him schedule a bone density scan.  He will continue his calcium and vitamin D supplementation.  We will check a vitamin D level today.

## 2021-02-15 NOTE — Assessment & Plan Note (Signed)
Generally stable.  Seems to have an issue with BPH as well as OAB.  He will monitor and continue to follow with urology.

## 2021-02-15 NOTE — Patient Instructions (Signed)
Nice to see you. Please call [phone] to schedule your bone density scan. We will contact you with your lab results.

## 2021-02-15 NOTE — Progress Notes (Signed)
Matthew Rumps, MD Phone: [phone]  Matthew Miles. is a 87 y.o. male who presents today for f/u.  DIABETES Disease Monitoring: Blood Sugar ranges-"sometime high, sometimes normal" Polyuria/phagia/dipsia- chronic polyuria      Optho- UTD Medications: Compliance- taking metformin and trulicity Hypoglycemic symptoms- no  HYPERTENSION Disease Monitoring Home BP Monitoring "stable" Chest pain- no    Dyspnea- no changes to chronic dyspnea Medications Compliance-  taking coreg, torsemide.  Edema- no BMET    Component Value Date/Time   NA 133 (L) 11/26/2020 1908   NA 140 10/29/2018 1025   NA 131 (L) 08/09/2012 1901   K 4.6 11/26/2020 1908   K 5.1 08/09/2012 1901   CL 100 11/26/2020 1908   CL 98 08/09/2012 1901   CO2 25 11/26/2020 1908   CO2 27 08/09/2012 1901   GLUCOSE 169 (H) 11/26/2020 1908   GLUCOSE 83 08/09/2012 1901   BUN 25 (H) 11/26/2020 1908   BUN 20 10/29/2018 1025   BUN 14 08/09/2012 1901   CREATININE 1.20 11/26/2020 1908   CREATININE 1.35 (H) 11/14/2016 1407   CALCIUM 8.9 11/26/2020 1908   CALCIUM 8.6 08/09/2012 1901   GFRNONAA 58 (L) 11/26/2020 1908   GFRNONAA >60 08/09/2012 1901   GFRAA 46 (L) 10/29/2018 1025   GFRAA >60 08/09/2012 1901   BPH/overactive bladder: Patient reports he saw urology.  They felt as though his issue is more of a bladder issue.  They tried to start him on medication though the patient notes he was satisfied with how the symptoms are going and he did not start on medication.  He wears depends daily.  No blood in his urine.  Osteopenia: This is noted on x-ray of his hand recently.  He had a fall where he fractured his fifth metacarpal of his left hand.  The fall was related to taking 2 Benadryl in 2 hours after being stung by yellow jackets.  He fell asleep and fell forward.  He reports he has healed completely.  Social History   Tobacco Use  Smoking Status Former   Packs/day: 1.00   Years: 65.00   Pack years: 65.00   Types:  Cigarettes   Quit date: 04/10/2011   Years since quitting: 9.8  Smokeless Tobacco Former   Types: Chew    Current Outpatient Medications on File Prior to Visit  Medication Sig Dispense Refill   Accu-Chek FastClix Lancets MISC Inject 1 each into the skin in the morning and at bedtime.     ACCU-CHEK GUIDE test strip TEST BLOOD SUGAR TWICE DAILY AS DIRECTED 200 strip 2   alfuzosin (UROXATRAL) 10 MG 24 hr tablet Take 1 tablet (10 mg total) by mouth daily with breakfast. 90 tablet 0   aspirin 81 MG tablet Take 81 mg by mouth daily.      Calcium Citrate-Vitamin D (CALCIUM CITRATE + D PO) Take by mouth. 1200 mg of calcium and [phone] units of vitamin D daily     carvedilol (COREG) 6.25 MG tablet TAKE 1 TABLET TWICE DAILY 180 tablet 3   cephALEXin (KEFLEX) 500 MG capsule Take 1 capsule (500 mg total) by mouth 3 (three) times daily. 21 capsule 0   ferrous sulfate 325 (65 FE) MG EC tablet Take 325 mg by mouth daily.     metFORMIN (GLUCOPHAGE-XR) 500 MG 24 hr tablet TAKE 2 TABLETS(1000 MG) BY MOUTH TWICE DAILY 360 tablet 3   mupirocin ointment (BACTROBAN) 2 % Apply topically.     nitroGLYCERIN (NITROSTAT) 0.4 MG SL tablet  Place 1 tablet (0.4 mg total) under the tongue every 5 (five) minutes as needed. 25 tablet 3   pantoprazole (PROTONIX) 20 MG tablet TAKE 1 TABLET EVERY DAY 90 tablet 3   potassium chloride (KLOR-CON) 10 MEQ tablet Take 1 tablet (10 mEq total) by mouth every other day. 45 tablet 3   pravastatin (PRAVACHOL) 40 MG tablet Take 1 tablet (40 mg total) by mouth daily. 90 tablet 3   PROAIR HFA 108 (90 Base) MCG/ACT inhaler INHALE 2 PUFFS INTO THE LUNGS EVERY 6 HOURS AS NEEDED FOR WHEEZING OR SHORTNESS OF BREATH 8.5 g 0   Tiotropium Bromide Monohydrate (SPIRIVA RESPIMAT) 2.5 MCG/ACT AERS Inhale 2 puffs into the lungs daily. 4 g 5   torsemide (DEMADEX) 10 MG tablet TAKE 1 TABLET(10 MG) BY MOUTH TWICE DAILY AS NEEDED (Patient taking differently: Take 20 mg by mouth daily. Per [name]) 062  tablet 3   TRULICITY 1.5 IR/4.8NI SOPN INJECT 1.5MG (1 PEN) SUBCUTANEOUSLY EVERY WEEK 6 mL 1   mupirocin ointment (BACTROBAN) 2 % 1 application 2 (two) times daily as needed. (Patient not taking: Reported on 02/15/2021)     No current facility-administered medications on file prior to visit.     ROS see history of present illness  Objective  Physical Exam Vitals:   02/15/21 1109  BP: 110/70  Pulse: 89  Temp: 98.4 F (36.9 C)  SpO2: 96%    BP Readings from Last 3 Encounters:  02/15/21 110/70  11/27/20 (!) 143/74  11/09/20 (!) 120/48   Wt Readings from Last 3 Encounters:  02/15/21 174 lb 9.6 oz (79.2 kg)  11/09/20 171 lb 8 oz (77.8 kg)  11/03/20 174 lb 3.2 oz (79 kg)    Physical Exam Constitutional:      General: He is not in acute distress.    Appearance: He is not diaphoretic.  Cardiovascular:     Rate and Rhythm: Normal rate and regular rhythm.     Heart sounds: Normal heart sounds.  Pulmonary:     Effort: Pulmonary effort is normal.     Breath sounds: Normal breath sounds.  Musculoskeletal:     Right lower leg: No edema.     Left lower leg: No edema.  Skin:    General: Skin is warm and dry.  Neurological:     Mental Status: He is alert.     Assessment/Plan: Please see individual problem list.  Problem List Items Addressed This Visit     Essential hypertension - Primary (Chronic)    Adequate control for age.  He will continue carvedilol 6.25 mg twice daily and torsemide 10 mg twice daily as needed.      BPH (benign prostatic hyperplasia)    Generally stable.  Seems to have an issue with BPH as well as OAB.  He will monitor and continue to follow with urology.      Diabetes (HCC)    Check A1c.  He will continue Trulicity 1.5 mg once weekly and metformin XR 1000 mg twice daily.  Check urine microalbumin as well.      Relevant Medications   blood glucose meter kit and supplies KIT   Other Relevant Orders   HgB A1c   Urine Microalbumin w/creat.  ratio   Elevated TSH    Recheck TSH.      Relevant Orders   TSH   Osteopenia    We will have him schedule a bone density scan.  He will continue his calcium and vitamin D supplementation.  We will check a vitamin D level today.      Relevant Orders   Vitamin D (25 hydroxy)   DG Bone Density   Return in about 6 months (around 08/15/2021).  This visit occurred during the SARS-CoV-2 public health emergency.  Safety protocols were in place, including screening questions prior to the visit, additional usage of staff PPE, and extensive cleaning of exam room while observing appropriate contact time as indicated for disinfecting solutions.    Matthew Rumps, MD [place]

## 2021-02-15 NOTE — Assessment & Plan Note (Signed)
Check A1c.  He will continue Trulicity 1.5 mg once weekly and metformin XR 1000 mg twice daily.  Check urine microalbumin as well.

## 2021-02-15 NOTE — Assessment & Plan Note (Signed)
Recheck TSH 

## 2021-02-15 NOTE — Assessment & Plan Note (Signed)
Adequate control for age.  He will continue carvedilol 6.25 mg twice daily and torsemide 10 mg twice daily as needed.

## 2021-02-18 ENCOUNTER — Other Ambulatory Visit: Payer: Self-pay

## 2021-02-18 DIAGNOSIS — E1165 Type 2 diabetes mellitus with hyperglycemia: Secondary | ICD-10-CM

## 2021-02-18 MED ORDER — ACCU-CHEK GUIDE VI STRP
ORAL_STRIP | 2 refills | Status: DC
Start: 2021-02-18 — End: 2021-02-22

## 2021-02-18 MED ORDER — ACCU-CHEK FASTCLIX LANCETS MISC
1.0000 | Freq: Two times a day (BID) | 4 refills | Status: DC
Start: 2021-02-18 — End: 2021-04-05

## 2021-02-19 ENCOUNTER — Telehealth: Payer: Self-pay

## 2021-02-19 DIAGNOSIS — B9689 Other specified bacterial agents as the cause of diseases classified elsewhere: Secondary | ICD-10-CM | POA: Diagnosis not present

## 2021-02-19 DIAGNOSIS — Z03818 Encounter for observation for suspected exposure to other biological agents ruled out: Secondary | ICD-10-CM | POA: Diagnosis not present

## 2021-02-19 DIAGNOSIS — L03115 Cellulitis of right lower limb: Secondary | ICD-10-CM | POA: Diagnosis not present

## 2021-02-19 DIAGNOSIS — J019 Acute sinusitis, unspecified: Secondary | ICD-10-CM | POA: Diagnosis not present

## 2021-02-19 DIAGNOSIS — R051 Acute cough: Secondary | ICD-10-CM | POA: Diagnosis not present

## 2021-02-19 NOTE — Telephone Encounter (Signed)
I spoke with the patients daughter and she stated he had a spot on his leg that she has been watching, patient also woke up with a lot of pressure in his nose.  And he had a headache.  She stated he is using a humidifier and they will test in 2 days for covid and let us know the results.  Barett Whidbee,cma

## 2021-02-22 ENCOUNTER — Other Ambulatory Visit: Payer: Self-pay | Admitting: Family Medicine

## 2021-02-22 DIAGNOSIS — E1165 Type 2 diabetes mellitus with hyperglycemia: Secondary | ICD-10-CM

## 2021-02-22 DIAGNOSIS — E119 Type 2 diabetes mellitus without complications: Secondary | ICD-10-CM

## 2021-02-23 ENCOUNTER — Telehealth: Payer: Self-pay | Admitting: *Deleted

## 2021-02-23 ENCOUNTER — Telehealth: Payer: Self-pay | Admitting: Family Medicine

## 2021-02-23 DIAGNOSIS — R7989 Other specified abnormal findings of blood chemistry: Secondary | ICD-10-CM

## 2021-02-23 NOTE — Telephone Encounter (Signed)
Patient has a lab appt for 02/26/2021, there are no orders in.

## 2021-02-23 NOTE — Telephone Encounter (Signed)
Please place future orders for lab appt.  

## 2021-02-24 NOTE — Telephone Encounter (Signed)
Ordered

## 2021-02-24 NOTE — Addendum Note (Signed)
Addended by: [name] on: 02/24/2021 01:48 PM   Modules accepted: Orders

## 2021-02-26 ENCOUNTER — Other Ambulatory Visit: Payer: Self-pay

## 2021-02-26 ENCOUNTER — Other Ambulatory Visit (INDEPENDENT_AMBULATORY_CARE_PROVIDER_SITE_OTHER): Payer: Medicare HMO

## 2021-02-26 DIAGNOSIS — R7989 Other specified abnormal findings of blood chemistry: Secondary | ICD-10-CM

## 2021-02-26 LAB — T4, FREE: Free T4: 0.71 ng/dL (ref 0.60–1.60)

## 2021-03-04 ENCOUNTER — Emergency Department: Payer: Medicare HMO

## 2021-03-04 ENCOUNTER — Emergency Department
Admission: EM | Admit: 2021-03-04 | Discharge: 2021-03-04 | Disposition: A | Discharge status: Home / Self Care | Payer: Medicare HMO | Attending: Emergency Medicine | Admitting: Emergency Medicine

## 2021-03-04 ENCOUNTER — Encounter: Payer: Self-pay | Admitting: Emergency Medicine

## 2021-03-04 ENCOUNTER — Other Ambulatory Visit: Payer: Self-pay

## 2021-03-04 DIAGNOSIS — I2510 Atherosclerotic heart disease of native coronary artery without angina pectoris: Secondary | ICD-10-CM | POA: Diagnosis not present

## 2021-03-04 DIAGNOSIS — M25512 Pain in left shoulder: Secondary | ICD-10-CM | POA: Diagnosis not present

## 2021-03-04 DIAGNOSIS — M79602 Pain in left arm: Secondary | ICD-10-CM | POA: Diagnosis not present

## 2021-03-04 DIAGNOSIS — I5032 Chronic diastolic (congestive) heart failure: Secondary | ICD-10-CM | POA: Diagnosis not present

## 2021-03-04 DIAGNOSIS — Z79899 Other long term (current) drug therapy: Secondary | ICD-10-CM | POA: Diagnosis not present

## 2021-03-04 DIAGNOSIS — Z8546 Personal history of malignant neoplasm of prostate: Secondary | ICD-10-CM | POA: Diagnosis not present

## 2021-03-04 DIAGNOSIS — J449 Chronic obstructive pulmonary disease, unspecified: Secondary | ICD-10-CM | POA: Diagnosis not present

## 2021-03-04 DIAGNOSIS — Z7984 Long term (current) use of oral hypoglycemic drugs: Secondary | ICD-10-CM | POA: Diagnosis not present

## 2021-03-04 DIAGNOSIS — R531 Weakness: Secondary | ICD-10-CM | POA: Insufficient documentation

## 2021-03-04 DIAGNOSIS — Z7982 Long term (current) use of aspirin: Secondary | ICD-10-CM | POA: Insufficient documentation

## 2021-03-04 DIAGNOSIS — M79632 Pain in left forearm: Secondary | ICD-10-CM | POA: Diagnosis not present

## 2021-03-04 DIAGNOSIS — E119 Type 2 diabetes mellitus without complications: Secondary | ICD-10-CM | POA: Insufficient documentation

## 2021-03-04 DIAGNOSIS — I110 Hypertensive heart disease with heart failure: Secondary | ICD-10-CM | POA: Insufficient documentation

## 2021-03-04 DIAGNOSIS — I10 Essential (primary) hypertension: Secondary | ICD-10-CM | POA: Diagnosis not present

## 2021-03-04 LAB — CBC WITH DIFFERENTIAL/PLATELET
Abs Immature Granulocytes: 0.04 10*3/uL (ref 0.00–0.07)
Basophils Absolute: 0 10*3/uL (ref 0.0–0.1)
Basophils Relative: 0 %
Eosinophils Absolute: 0.1 10*3/uL (ref 0.0–0.5)
Eosinophils Relative: 1 %
HCT: 29.2 % — ABNORMAL LOW (ref 39.0–52.0)
Hemoglobin: 10 g/dL — ABNORMAL LOW (ref 13.0–17.0)
Immature Granulocytes: 0 %
Lymphocytes Relative: 9 %
Lymphs Abs: 0.8 10*3/uL (ref 0.7–4.0)
MCH: 32.7 pg (ref 26.0–34.0)
MCHC: 34.2 g/dL (ref 30.0–36.0)
MCV: 95.4 fL (ref 80.0–100.0)
Monocytes Absolute: 0.9 10*3/uL (ref 0.1–1.0)
Monocytes Relative: 9 %
Neutro Abs: 7.6 10*3/uL (ref 1.7–7.7)
Neutrophils Relative %: 81 %
Platelets: 153 10*3/uL (ref 150–400)
RBC: 3.06 MIL/uL — ABNORMAL LOW (ref 4.22–5.81)
RDW: 13.8 % (ref 11.5–15.5)
WBC: 9.4 10*3/uL (ref 4.0–10.5)
nRBC: 0 % (ref 0.0–0.2)

## 2021-03-04 LAB — LIPASE, BLOOD: Lipase: 57 U/L — ABNORMAL HIGH (ref 11–51)

## 2021-03-04 LAB — COMPREHENSIVE METABOLIC PANEL
ALT: 20 U/L (ref 0–44)
AST: 17 U/L (ref 15–41)
Albumin: 3.5 g/dL (ref 3.5–5.0)
Alkaline Phosphatase: 82 U/L (ref 38–126)
Anion gap: 6 (ref 5–15)
BUN: 31 mg/dL — ABNORMAL HIGH (ref 8–23)
CO2: 21 mmol/L — ABNORMAL LOW (ref 22–32)
Calcium: 8.6 mg/dL — ABNORMAL LOW (ref 8.9–10.3)
Chloride: 107 mmol/L (ref 98–111)
Creatinine, Ser: 1.38 mg/dL — ABNORMAL HIGH (ref 0.61–1.24)
GFR, Estimated: 49 mL/min — ABNORMAL LOW (ref >60)
Glucose, Bld: 212 mg/dL — ABNORMAL HIGH (ref 70–99)
Potassium: 4.6 mmol/L (ref 3.5–5.1)
Sodium: 134 mmol/L — ABNORMAL LOW (ref 135–145)
Total Bilirubin: 0.6 mg/dL (ref 0.3–1.2)
Total Protein: 6.8 g/dL (ref 6.5–8.1)

## 2021-03-04 LAB — TROPONIN I (HIGH SENSITIVITY)
Troponin I (High Sensitivity): 11 ng/L (ref <18)
Troponin I (High Sensitivity): 10 ng/L (ref <18)

## 2021-03-04 IMAGING — CR DG SHOULDER 2+V*L*
1 series · 3 of 3 positions shown · non-contrast
Comparison: Chest radiograph dated [DATE].

CLINICAL DATA: Left shoulder pain.

EXAM:
LEFT SHOULDER - 2+ VIEW

[Series 1: dg shoulder left · 0.14mm/px · 3 of 3 slices shown]
[im 1/3]
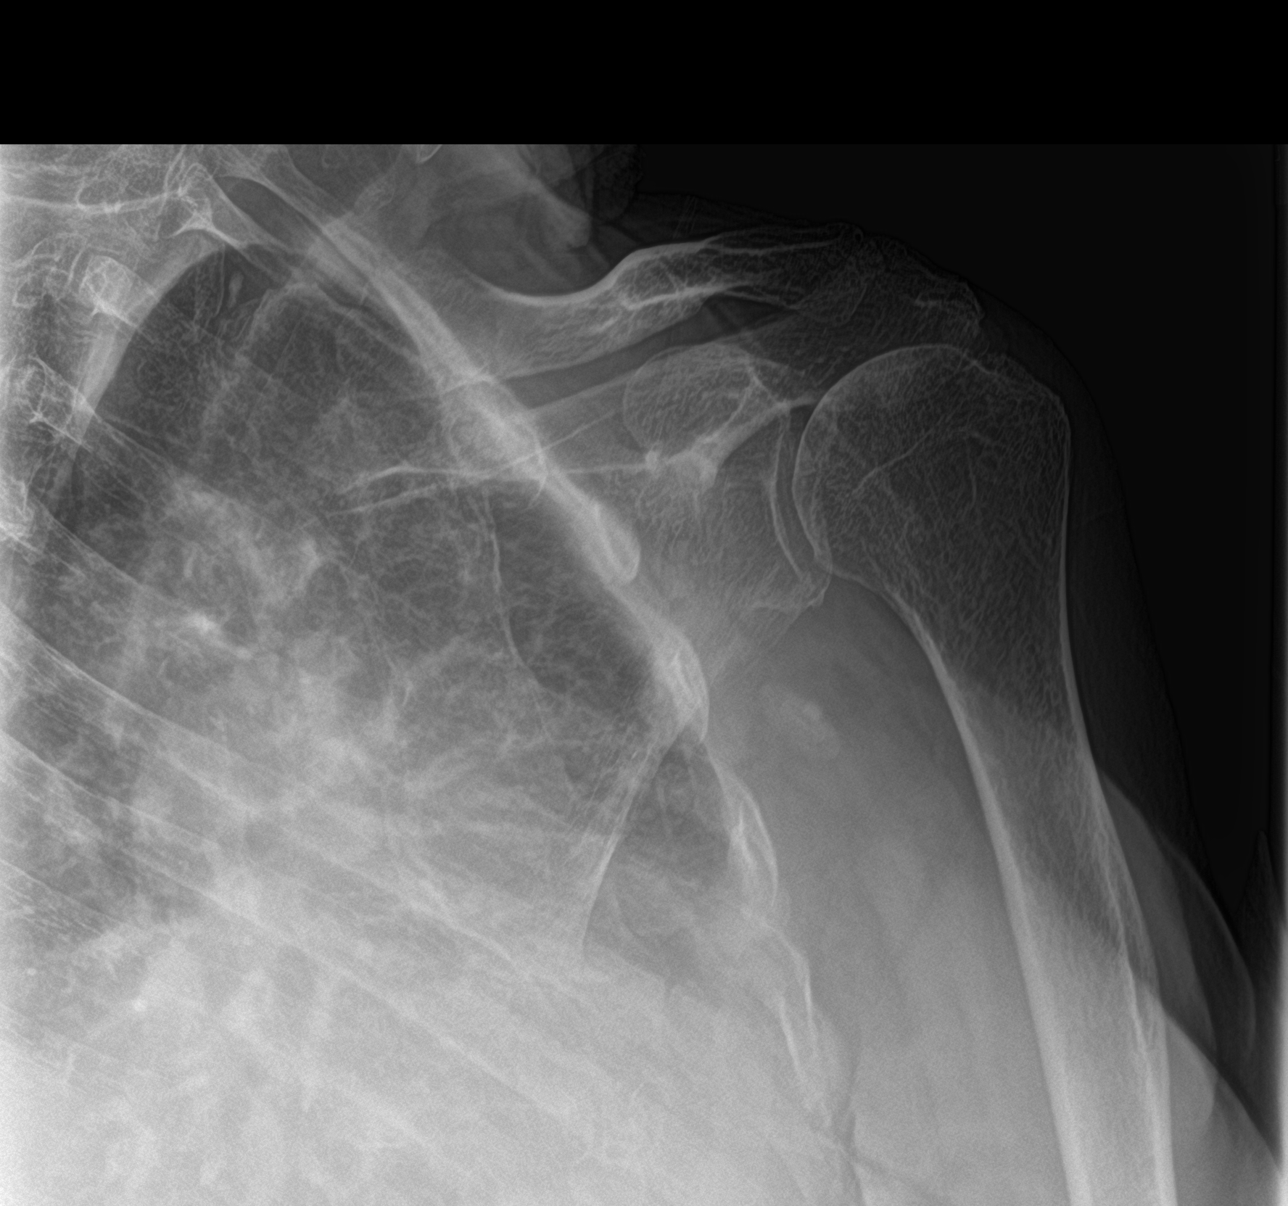
[im 2/3]
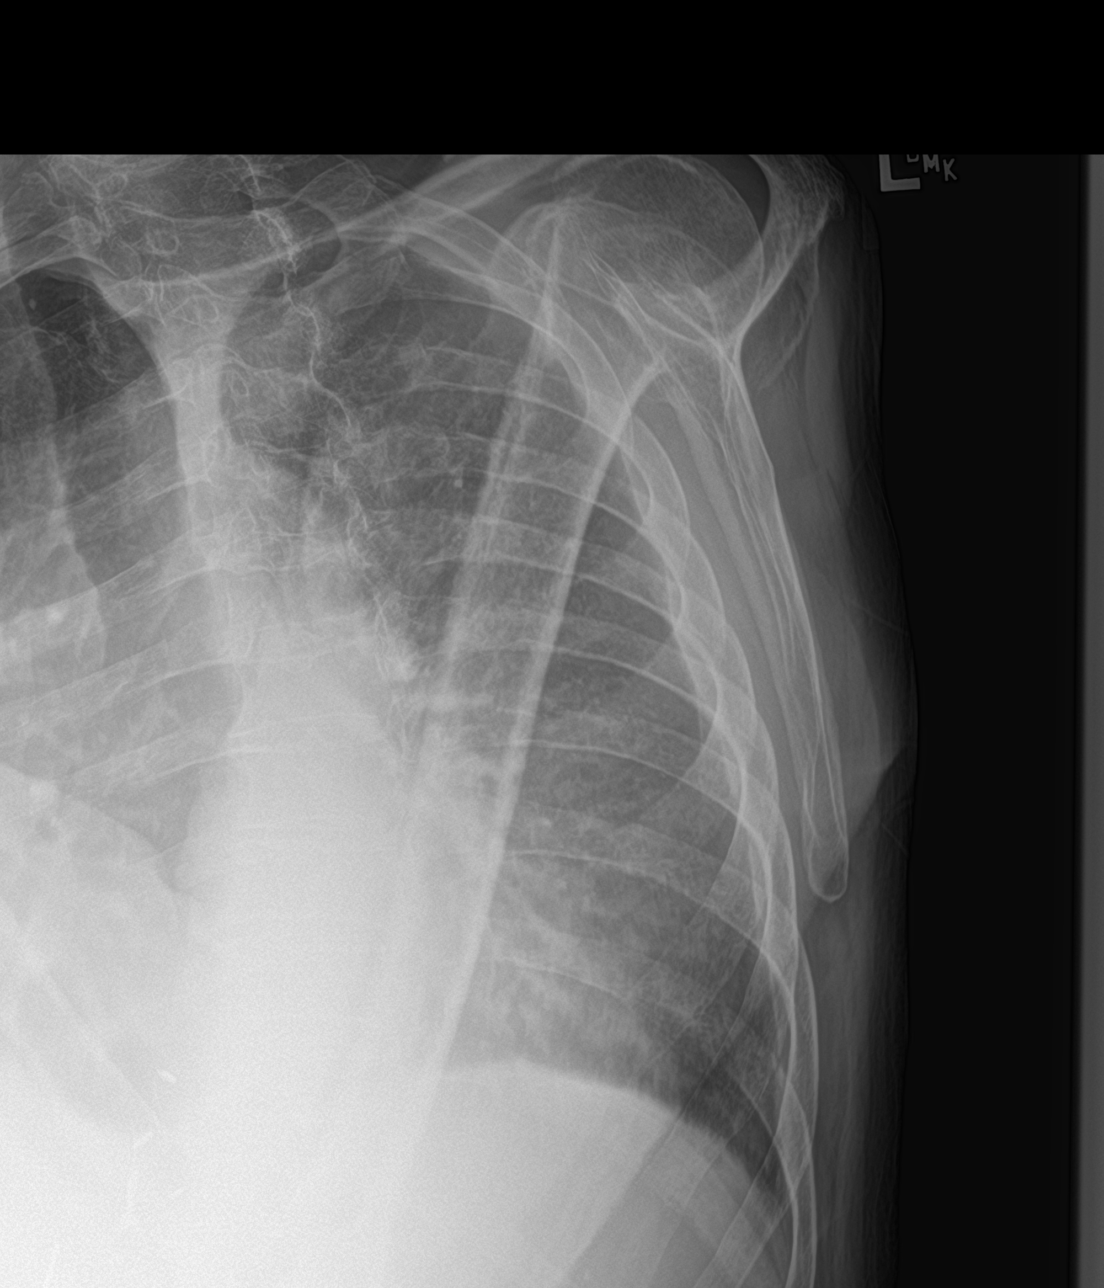
[im 3/3]
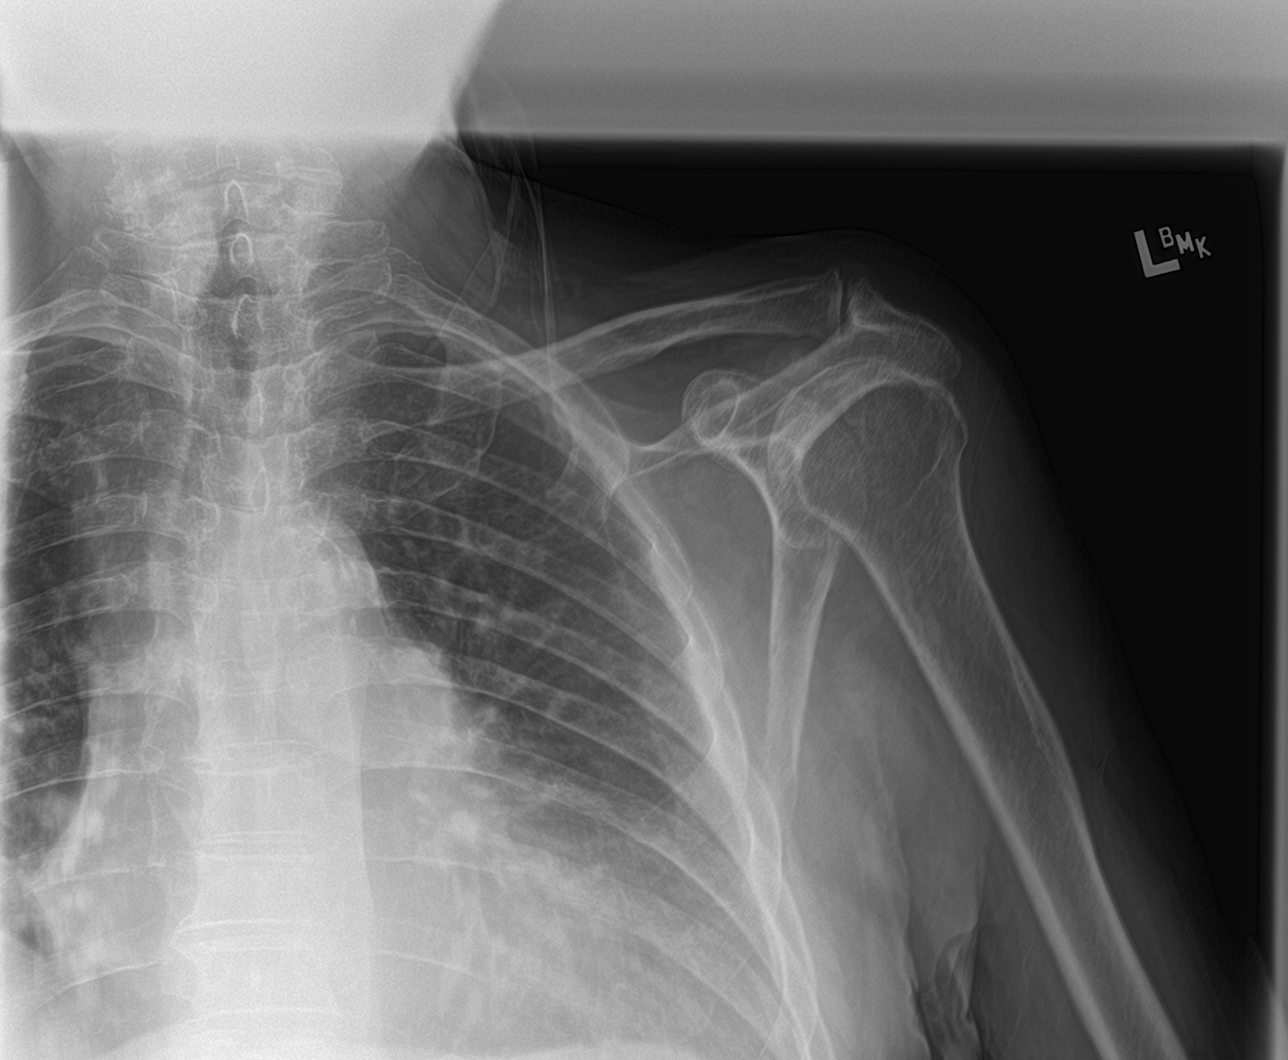

[3 of 3 positions shown; findings below may reference images not displayed]

FINDINGS: No acute fracture or dislocation. The bones are osteopenic. No
significant arthritic changes. The soft tissues are unremarkable.
IMPRESSION: No acute fracture or dislocation.

## 2021-03-04 IMAGING — CR DG CHEST 2V
1 series · 2 of 2 positions shown · non-contrast
Comparison: Chest radiograph dated [DATE]

CLINICAL DATA: Left arm pain.

EXAM:
CHEST - 2 VIEW

[Series 1: dg chest 2 view · 0.14mm/px · 2 of 2 slices shown]
[im 1/2]
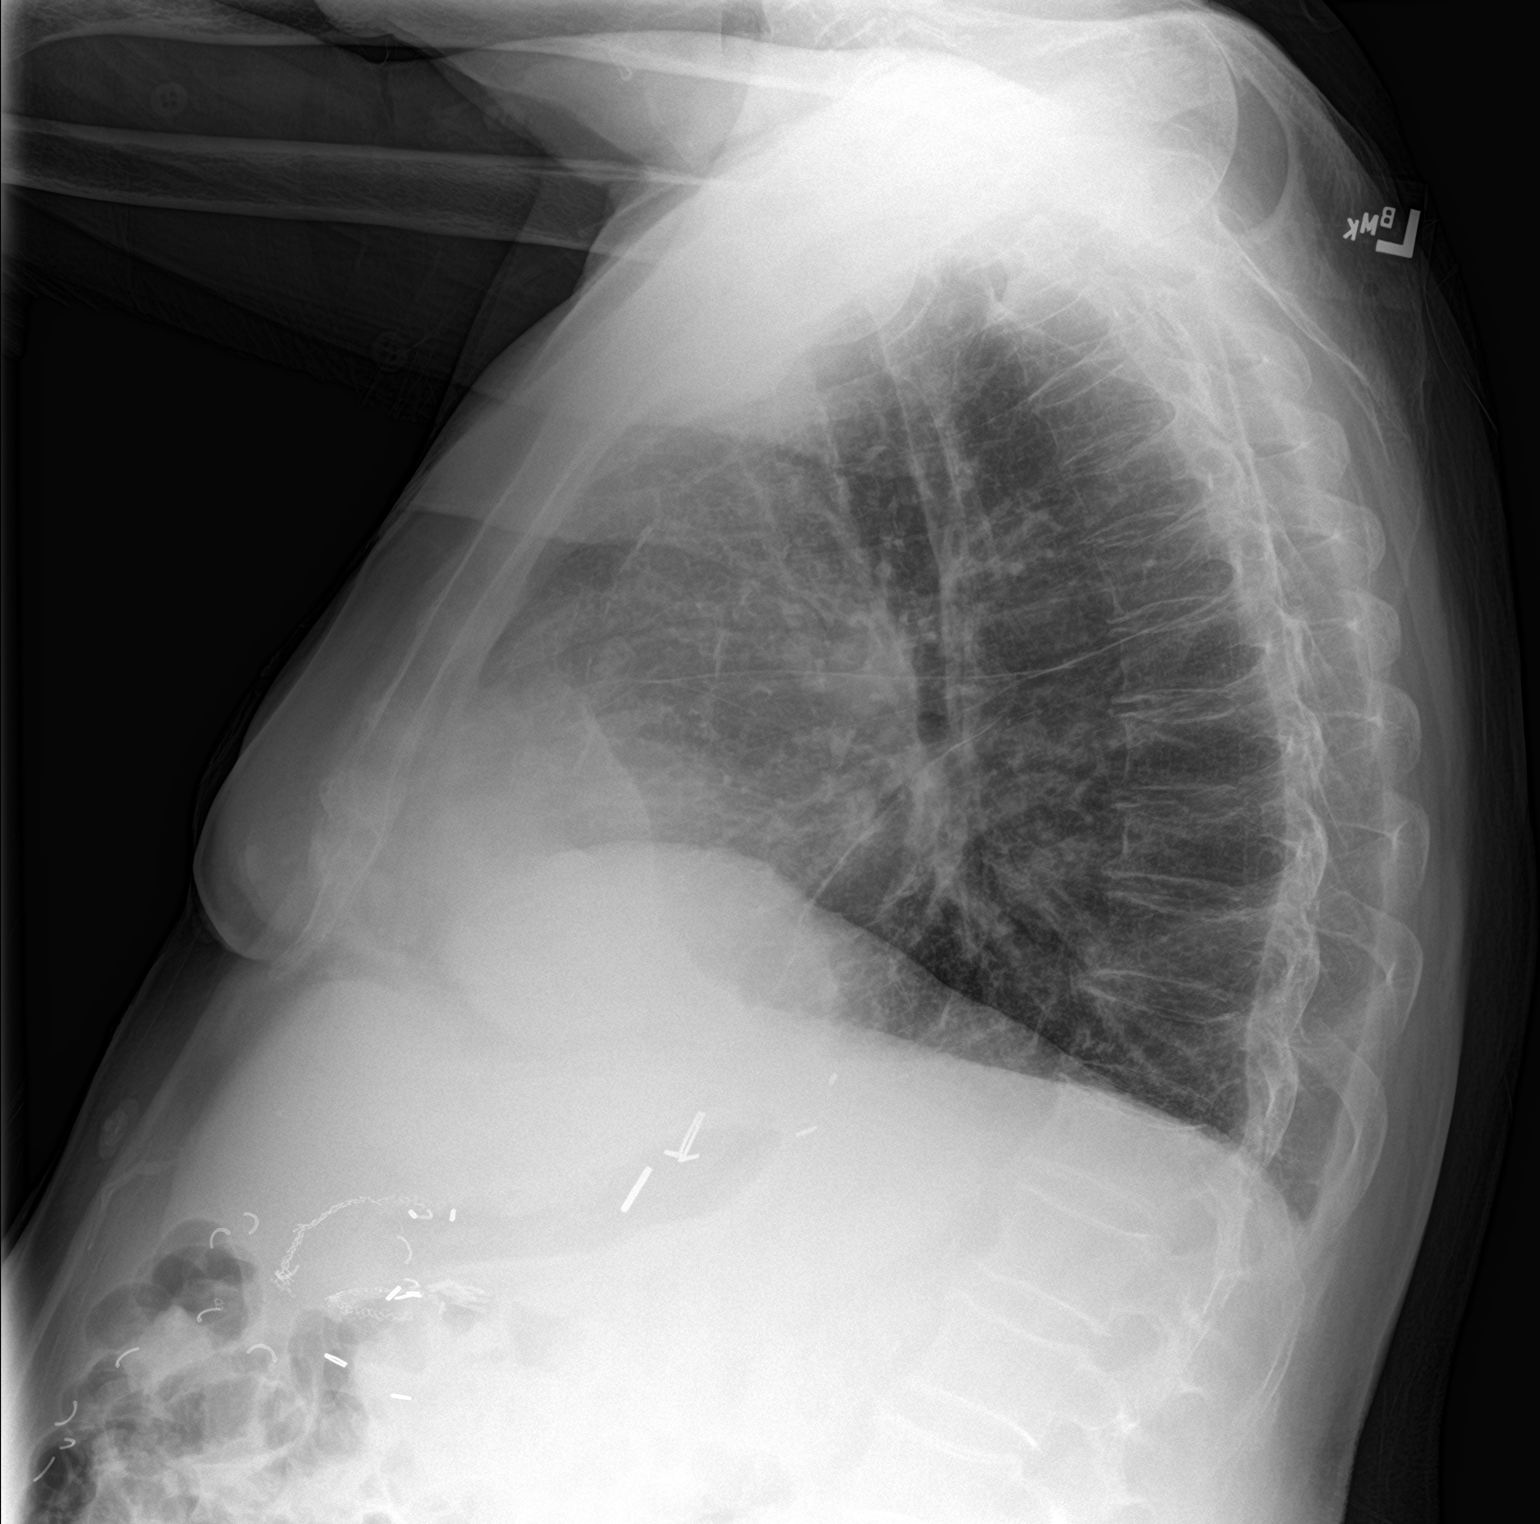
[im 2/2]
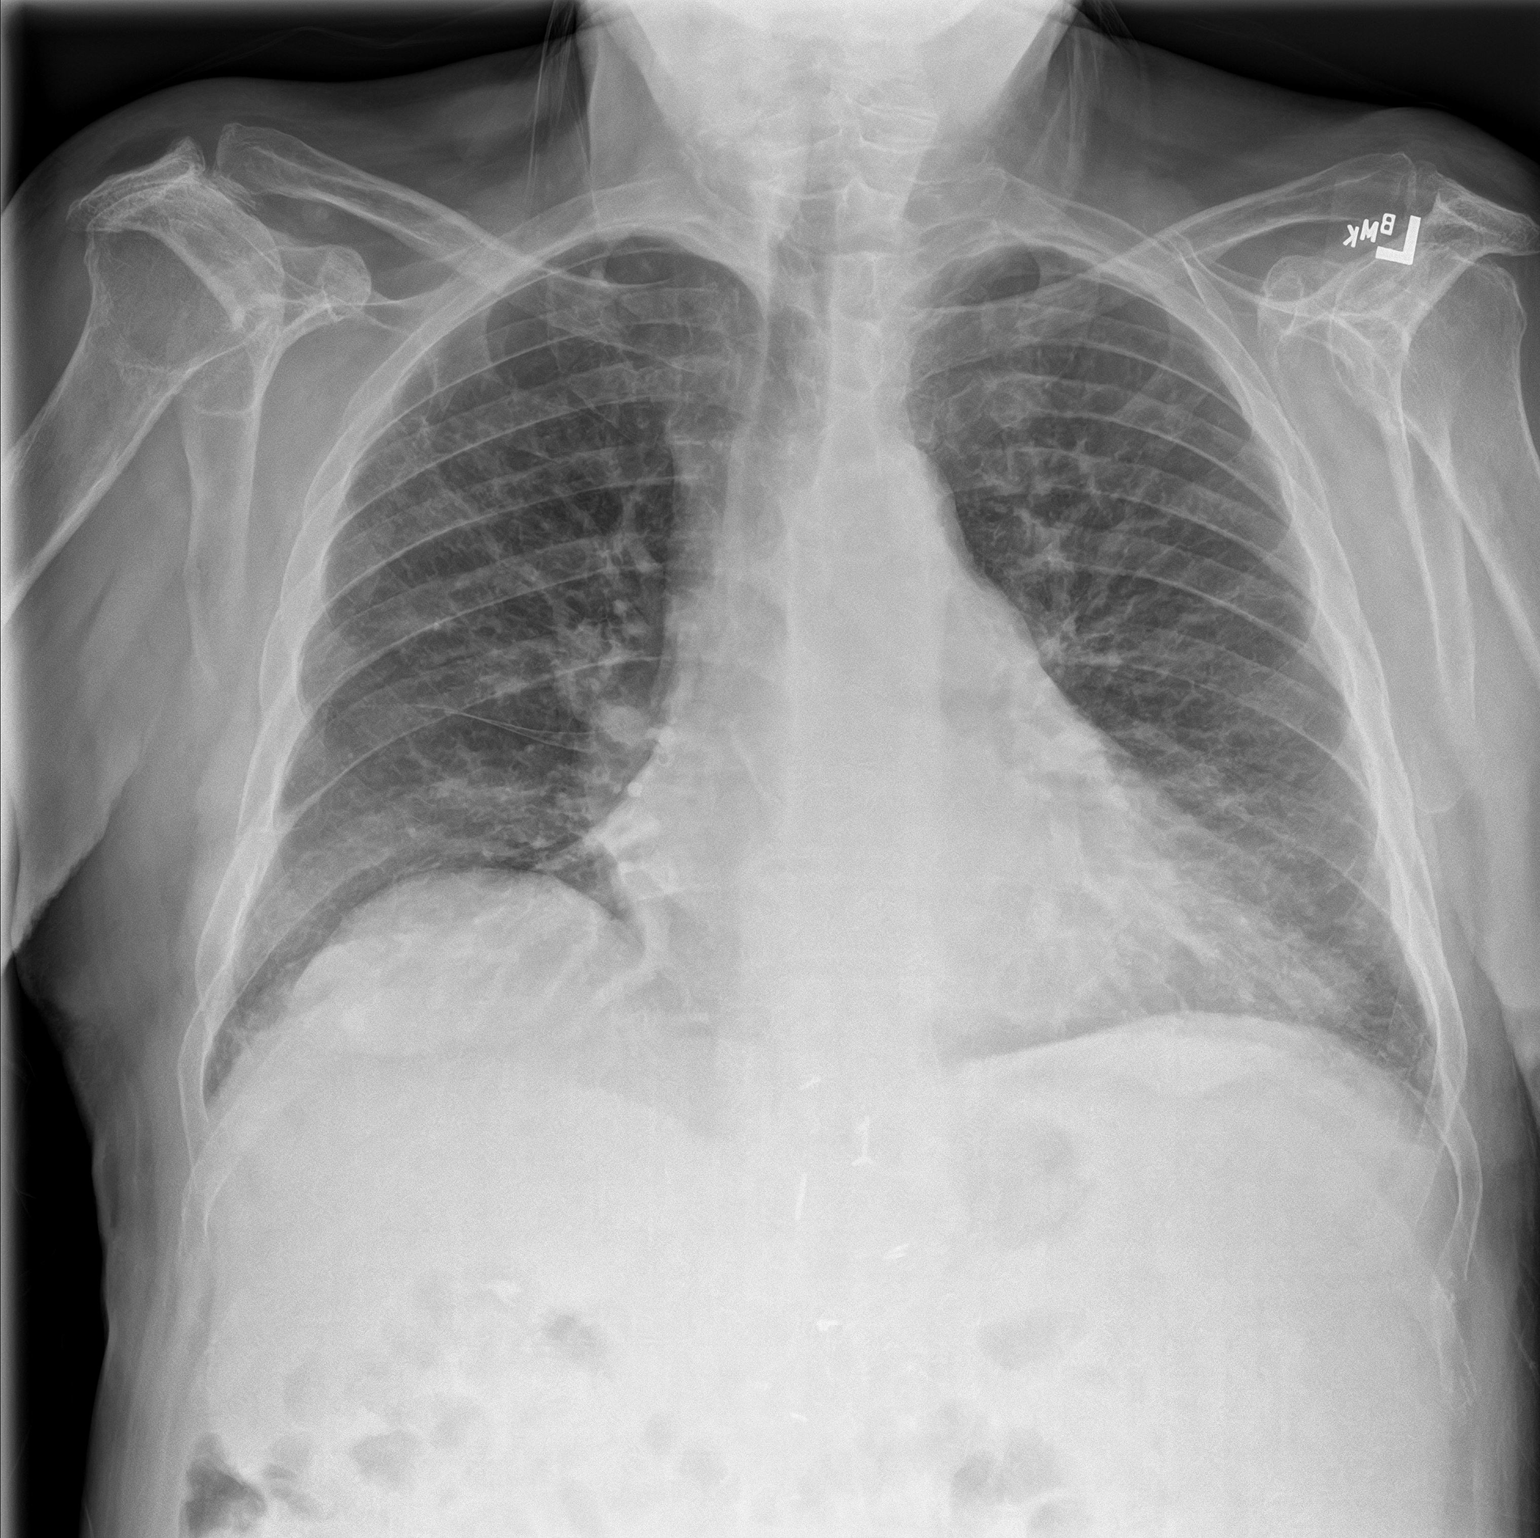

[2 of 2 positions shown; findings below may reference images not displayed]

FINDINGS: Mild diffuse interstitial coarsening. No focal consolidation,
pleural effusion or pneumothorax. The cardiac silhouette is within
limits. No acute osseous pathology. Degenerative changes of the
spine. Surgical clips in the upper abdomen.
IMPRESSION: No active cardiopulmonary disease.

## 2021-03-04 NOTE — ED Notes (Signed)
AAOx3.  Skin warm and dry.  NAD 

## 2021-03-04 NOTE — ED Provider Notes (Signed)
Emergency Medicine Provider Triage Evaluation Note  Matthew Miles. , a 87 y.o. male  was evaluated in triage.  Pt complains of left arm pain; history of CAD.  Took 3 nitroglycerin prior to arrival.  Review of Systems  Positive: Left arm pain Negative: Shortness of breath  Physical Exam  BP (!) 109/57 (BP Location: Right Arm)   Pulse 84   Temp 98 F (36.7 C) (Oral)   Resp 18   Ht 5\' 3"  (1.6 m)   Wt 77.1 kg   SpO2 97%   BMI 30.11 kg/m  Gen:   Awake, no distress   Resp:  Normal effort  MSK:   Left shoulder tender to palpation and with range of motion Other:    Medical Decision Making  Medically screening exam initiated at 2:44 AM.  Appropriate orders placed.  Matthew Miles. was informed that the remainder of the evaluation will be completed by another provider, this initial triage assessment does not replace that evaluation, and the importance of remaining in the ED until their evaluation is complete.  87 year old male presenting with left arm pain; history of CAD.  Will obtain cardiac panel and imaging studies while patient awaits treatment room.   Paulette Blanch, MD 03/04/21 [phone]

## 2021-03-04 NOTE — ED Provider Notes (Signed)
[facility] Emergency Department Provider Note  ____________________________________________   Event Date/Time   First MD Initiated Contact with Patient 03/04/21 0801     (approximate)  I have reviewed the triage vital signs and the nursing notes.   HISTORY  Chief Complaint Arm Pain   HPI Matthew Miles. is a 87 y.o. male with past medical history of CHF, COPD, DM, HTN, HDL, Prostate cancer and status postradiation currently receiving outpatient chemo who presents accompanied by his son for assessment of some soreness in his left shoulder.  Patient notes he has chronic weakness in this left shoulder is normally not able to raise his elbow past 90 degrees.  He states it started feeling sore 2 or 3 days ago and has not been getting better with Tylenol.  He denies any injuries or falls.  He denies any chest pain, cough, shortness of breath, neck pain, back pain, right upper extremity pain, lower extremity pain, fevers or any new abdominal pain, vomiting, diarrhea or urinary symptoms.  He denies any pain in his left elbow or wrist or any headache, earache, sore throat or other clear associated sick symptoms.  He states that he is most worried that it may be related to his heart.  No other acute concerns at this time.         Past Medical History:  Diagnosis Date   Allergy    Arthritis    CHF (congestive heart failure) (HCC)    Chicken pox    Cholecystitis    Colon polyps    COPD (chronic obstructive pulmonary disease) (HCC)    Coronary artery disease    Diabetes mellitus without complication (HCC)    Emphysema of lung (HCC)    GERD (gastroesophageal reflux disease)    Heart murmur    Hematemesis/vomiting blood 04/10/11   Hypercholesterolemia    Mild hypertension    Pancreatitis    Prostate cancer ([place])    Prostate cancer ([place])    radiation therapy    Smoker    Ulcer    Upper GI bleed 1980    Patient Active Problem List   Diagnosis Date Noted    Osteopenia 02/15/2021   B12 deficiency 07/31/2020   PAD (peripheral artery disease) ([place]) 01/09/2020   Chronic venous insufficiency 01/09/2020   Elevated TSH 01/01/2020   Decreased pedal pulses 01/01/2020   Diabetic neuropathy ([place]) 12/31/2018   Diabetic retinopathy ([place]) 10/07/2018   Monoclonal B-cell lymphocytosis 07/18/2018   Prostate cancer ([place]) 07/04/2018   BPH (benign prostatic hyperplasia) 02/10/2017   Actinic keratoses 12/24/2015   Osteoarthritis of both hands 12/24/2015   Obesity (BMI 30-39.9) 10/21/2014   Essential hypertension 07/21/2014   Murmur, cardiac 10/15/2013   Chronic low back pain 10/08/2013   Chronic diastolic CHF (congestive heart failure) ([place]) 04/15/2013   Thrombocytopenia ([place]) 08/21/2012   Urge incontinence 07/02/2012   Hyperlipidemia 06/08/2012   H/O malignant neoplasm of prostate 06/04/2012   Diabetes ([place]) 04/26/2012   Iron deficiency anemia 01/06/2012   Pulmonary hypertension ([place]) 11/25/2011   GERD (gastroesophageal reflux disease) 06/13/2011   Coronary artery disease of native artery of native heart with stable angina pectoris ([place]) 04/26/2011   COPD (chronic obstructive pulmonary disease) [facility])     Past Surgical History:  Procedure Laterality Date   APPENDECTOMY     CARDIAC CATHETERIZATION  May 2002 and Feb 2013   [facility]; no stents    CATARACT EXTRACTION     CHOLECYSTECTOMY     CIRCUMCISION  COLONOSCOPY     HEMORRHOID SURGERY     SP CHOLECYSTOMY     STOMACH SURGERY     bleeding ulcers, followed by Dr. [name]    Prior to Admission medications   Medication Sig Start Date End Date Taking? Authorizing Provider  Accu-Chek FastClix Lancets MISC Inject 1 each into the skin in the morning and at bedtime. 02/18/21   [name], MD  ACCU-CHEK GUIDE test strip TEST BLOOD SUGAR TWICE DAILY AS DIRECTED 02/22/21   [name], MD  alfuzosin (UROXATRAL) 10 MG 24 hr tablet Take 1 tablet (10 mg total) by mouth daily with breakfast.  08/27/19   [name], MD  aspirin 81 MG tablet Take 81 mg by mouth daily.     [provider]  blood glucose meter kit and supplies KIT Dispense based on patient and insurance preference. Use once daily as directed. Dx Code E11.9 02/15/21   [name], MD  Calcium Citrate-Vitamin D (CALCIUM CITRATE + D PO) Take by mouth. 1200 mg of calcium and [phone] units of vitamin D daily    [provider]  carvedilol (COREG) 6.25 MG tablet TAKE 1 TABLET TWICE DAILY 11/09/20   Minna Merritts, MD  cephALEXin (KEFLEX) 500 MG capsule Take 1 capsule (500 mg total) by mouth 3 (three) times daily. 11/27/20   Paulette Blanch, MD  ferrous sulfate 325 (65 FE) MG EC tablet Take 325 mg by mouth daily.    [provider]  metFORMIN (GLUCOPHAGE-XR) 500 MG 24 hr tablet TAKE 2 TABLETS(1000 MG) BY MOUTH TWICE DAILY 09/21/20   [name], MD  mupirocin ointment (BACTROBAN) 2 % 1 application 2 (two) times daily as needed. Patient not taking: Reported on 02/15/2021    [provider]  mupirocin ointment (BACTROBAN) 2 % Apply topically.    [provider]  nitroGLYCERIN (NITROSTAT) 0.4 MG SL tablet Place 1 tablet (0.4 mg total) under the tongue every 5 (five) minutes as needed. 11/09/20   Minna Merritts, MD  pantoprazole (PROTONIX) 20 MG tablet TAKE 1 TABLET EVERY DAY 12/04/20   [name], MD  potassium chloride (KLOR-CON) 10 MEQ tablet Take 1 tablet (10 mEq total) by mouth every other day. 11/09/20   Minna Merritts, MD  pravastatin (PRAVACHOL) 40 MG tablet Take 1 tablet (40 mg total) by mouth daily. 11/09/20   Minna Merritts, MD  PROAIR HFA 108 [phone] Base) MCG/ACT inhaler INHALE 2 PUFFS INTO THE LUNGS EVERY 6 HOURS AS NEEDED FOR WHEEZING OR SHORTNESS OF BREATH 10/03/16   [name], MD  Tiotropium Bromide Monohydrate (SPIRIVA RESPIMAT) 2.5 MCG/ACT AERS Inhale 2 puffs into the lungs daily. 04/01/20   [name], MD  torsemide (DEMADEX) 10 MG  tablet TAKE 1 TABLET(10 MG) BY MOUTH TWICE DAILY AS NEEDED Patient taking differently: Take 20 mg by mouth daily. Per [name] 11/09/20   Minna Merritts, MD  TRULICITY 1.5 KK/9.3GH SOPN INJECT 1.$RemoveBefor'5MG'tCSwBwKYCmSu$  (1 PEN) SUBCUTANEOUSLY EVERY WEEK 12/15/20   [name], MD    Allergies Oxybutynin, Sulfa antibiotics, and Tradjenta [linagliptin]  Family History  Problem Relation Age of Onset   Prostate cancer Father    Liver cancer Brother    Prostate cancer Brother    Emphysema Sister        smoker    Social History Social History   Tobacco Use   Smoking status: Former    Packs/day: 1.00    Years: 65.00  Pack years: 65.00    Types: Cigarettes    Quit date: 04/10/2011    Years since quitting: 9.9   Smokeless tobacco: Former    Types: Nurse, children's Use: Former  Substance Use Topics   Alcohol use: Yes    Comment: 1 beer rarely   Drug use: No    Review of Systems  Review of Systems  Constitutional:  Negative for chills and fever.  HENT:  Negative for sore throat.   Eyes:  Negative for pain.  Respiratory:  Negative for cough and stridor.   Cardiovascular:  Negative for chest pain.  Gastrointestinal:  Negative for vomiting.  Musculoskeletal:  Positive for joint pain (shoulder).  Skin:  Negative for rash.  Neurological:  Positive for focal weakness (chronic in L shoulder). Negative for seizures, loss of consciousness and headaches.  Psychiatric/Behavioral:  Negative for suicidal ideas.   All other systems reviewed and are negative.    ____________________________________________   PHYSICAL EXAM:  VITAL SIGNS: ED Triage Vitals  Enc Vitals Group     BP 03/04/21 0239 (!) 109/57     Pulse Rate 03/04/21 0239 84     Resp 03/04/21 0239 18     Temp 03/04/21 0239 98 F (36.7 C)     Temp Source 03/04/21 0239 Oral     SpO2 03/04/21 0239 97 %     Weight 03/04/21 0238 170 lb (77.1 kg)     Height 03/04/21 0238 _0  (1.6 m)     Head Circumference --      Peak  Flow --      Pain Score 03/04/21 0238 7     Pain Loc --      Pain Edu? --      Excl. in [place]? --    Vitals:   03/04/21 0411 03/04/21 0754  BP: 115/60 (!) 111/52  Pulse: 77 82  Resp: 20 20  Temp: 98.2 F (36.8 C) 98.5 F (36.9 C)  SpO2: 96% 95%   Physical Exam Vitals and nursing note reviewed.  Constitutional:      General: He is not in acute distress.    Appearance: He is well-developed.  HENT:     Head: Normocephalic and atraumatic.     Right Ear: External ear normal.     Left Ear: External ear normal.  Eyes:     Conjunctiva/sclera: Conjunctivae normal.  Cardiovascular:     Rate and Rhythm: Normal rate and regular rhythm.     Heart sounds: No murmur heard. Pulmonary:     Effort: Pulmonary effort is normal. No respiratory distress.     Breath sounds: Normal breath sounds.  Abdominal:     General: There is distension.     Palpations: Abdomen is soft.     Tenderness: There is no abdominal tenderness.  Musculoskeletal:        General: No swelling.     Cervical back: Neck supple.  Skin:    General: Skin is warm and dry.     Capillary Refill: Capillary refill takes less than 2 seconds.  Neurological:     Mental Status: He is alert.  Psychiatric:        Mood and Affect: Mood normal.    Left shoulder has no effusion, overlying skin changes or areas of point tenderness.  Patient has some limitation range of motion and is unable to abduct to 90 degrees.  However he has full grip strength in the left hand compared to the  right and is able to flex and extend at the elbow with symmetric strength compared to the right.  Sensation is intact in the distribution of the radial ulnar and median nerves in the bilateral hands.  2+ radial pulses.  His spine is back is unremarkable.  Chest is unremarkable. ____________________________________________   LABS (all labs ordered are listed, but only abnormal results are displayed)  Labs Reviewed  CBC WITH DIFFERENTIAL/PLATELET - Abnormal;  Notable for the following components:      Result Value   RBC 3.06 (*)    Hemoglobin 10.0 (*)    HCT 29.2 (*)    All other components within normal limits  COMPREHENSIVE METABOLIC PANEL - Abnormal; Notable for the following components:   Sodium 134 (*)    CO2 21 (*)    Glucose, Bld 212 (*)    BUN 31 (*)    Creatinine, Ser 1.38 (*)    Calcium 8.6 (*)    GFR, Estimated 49 (*)    All other components within normal limits  LIPASE, BLOOD - Abnormal; Notable for the following components:   Lipase 57 (*)    All other components within normal limits  TROPONIN I (HIGH SENSITIVITY)  TROPONIN I (HIGH SENSITIVITY)   ____________________________________________  EKG  ECG remarkable sinus rhythm with first-degree AV block with a PR interval of 254 and a left anterior fascicle block with some nonspecific ST changes in inferior leads without other clear evidence of acute ischemia or significant arrhythmia. ____________________________________________  RADIOLOGY  ED MD interpretation:    Chest x-ray shows no pneumothorax, effusion, edema, focal consolidation, rib fracture or other acute process.  Plain film the left shoulder shows no acute fracture dislocation or obvious metastatic process.  Official radiology report(s): DG Chest 2 View  Result Date: 03/04/2021 CLINICAL DATA:  Left arm pain. EXAM: CHEST - 2 VIEW COMPARISON:  Chest radiograph dated 05/29/2020 FINDINGS: Mild diffuse interstitial coarsening. No focal consolidation, pleural effusion or pneumothorax. The cardiac silhouette is within limits. No acute osseous pathology. Degenerative changes of the spine. Surgical clips in the upper abdomen. IMPRESSION: No active cardiopulmonary disease. Electronically Signed   By: Anner Crete M.D.   On: 03/04/2021 03:30   DG Shoulder Left  Result Date: 03/04/2021 CLINICAL DATA:  Left shoulder pain. EXAM: LEFT SHOULDER - 2+ VIEW COMPARISON:  Chest radiograph dated 03/04/2021. FINDINGS: No  acute fracture or dislocation. The bones are osteopenic. No significant arthritic changes. The soft tissues are unremarkable. IMPRESSION: No acute fracture or dislocation. Electronically Signed   By: Anner Crete M.D.   On: 03/04/2021 03:31    ____________________________________________   PROCEDURES  Procedure(s) performed (including Critical Care):  Procedures   ____________________________________________   INITIAL IMPRESSION / ASSESSMENT AND PLAN / ED COURSE      Patient presents with above-stated history exam for assessment of some nontraumatic pain in his left shoulder started 2 to 3 days ago in the setting of some chronic weakness.  He is not sure what the weakness is from but thinks may be related to a ligamentous injury.  No associated injuries or falls or any other clear associated sick symptoms.  On arrival patient is afebrile hemodynamically stable.  He does have some rotation range of motion his left shoulder and endorses mild pain when attempting to range at the top of abduction but otherwise did not have significant tenderness overlying skin changes and effusion or edema.  Differential includes possible musculoskeletal injury versus muscle strain.  He has no fever  or leukocytosis or evidence on exam of a septic joint or cellulitis.  ECG remarkable sinus rhythm with first-degree AV block with a PR interval of 254 and a left anterior fascicle block with some nonspecific ST changes in inferior leads without other clear evidence of acute ischemia or significant arrhythmia.  Given nonelevated troponin x2-very low suspicion at this time for atypical presentation for ACS.  Lipase not consistent with atypical referred pain from a pancreatitis.  CMP shows slight elevated glucose of 212 without any other significant electrolyte or metabolic derangements.  No evidence of hepatitis or cholestasis given absence of tenderness on right upper quadrant below suspicion for referred pain from  cholecystitis.  CBC without leukocytosis or acute anemia.  Chest x-ray shows no pneumothorax, effusion, edema, focal consolidation, rib fracture or other acute process.  Plain film the left shoulder shows no acute fracture dislocation or obvious metastatic process.  Given he is denying any chest pain cough or shortness of breath and has no evidence of hypoxia below suspicion for PE.  I suspect possible MSK.  Discussed that I am also unable to rule definitively possible metastatic lesion but I think he does not need further emergent work-up and that he can safely be followed up with his PCP oncologist.  I discussed with patient and family at bedside and they are amenable to plan.  I think it is fine to continue Tylenol and NSAIDs if these are helping but does not hold off.  Discharged stable condition.  Strict return precautions advised and discussed.     ____________________________________________   FINAL CLINICAL IMPRESSION(S) / ED DIAGNOSES  Final diagnoses:  Acute pain of left shoulder    Medications - No data to display   ED Discharge Orders     None        Note:  This document was prepared using Dragon voice recognition software and may include unintentional dictation errors.    Lucrezia Starch, MD 03/04/21 [phone]

## 2021-03-04 NOTE — ED Triage Notes (Addendum)
Pt to triage via w/c with no distress noted; pt reports left arm pain for several days; denies any accomp symptoms; denies any injury; pain increases with ROM

## 2021-03-07 ENCOUNTER — Telehealth: Payer: Self-pay

## 2021-03-07 ENCOUNTER — Emergency Department: Payer: Medicare HMO

## 2021-03-07 ENCOUNTER — Other Ambulatory Visit: Payer: Self-pay

## 2021-03-07 ENCOUNTER — Encounter: Payer: Self-pay | Admitting: Emergency Medicine

## 2021-03-07 DIAGNOSIS — I5032 Chronic diastolic (congestive) heart failure: Secondary | ICD-10-CM | POA: Insufficient documentation

## 2021-03-07 DIAGNOSIS — Z79899 Other long term (current) drug therapy: Secondary | ICD-10-CM | POA: Insufficient documentation

## 2021-03-07 DIAGNOSIS — Z87891 Personal history of nicotine dependence: Secondary | ICD-10-CM | POA: Insufficient documentation

## 2021-03-07 DIAGNOSIS — R519 Headache, unspecified: Secondary | ICD-10-CM | POA: Insufficient documentation

## 2021-03-07 DIAGNOSIS — Z7984 Long term (current) use of oral hypoglycemic drugs: Secondary | ICD-10-CM | POA: Insufficient documentation

## 2021-03-07 DIAGNOSIS — Z7982 Long term (current) use of aspirin: Secondary | ICD-10-CM | POA: Insufficient documentation

## 2021-03-07 DIAGNOSIS — I25118 Atherosclerotic heart disease of native coronary artery with other forms of angina pectoris: Secondary | ICD-10-CM | POA: Diagnosis not present

## 2021-03-07 DIAGNOSIS — Z20822 Contact with and (suspected) exposure to covid-19: Secondary | ICD-10-CM | POA: Insufficient documentation

## 2021-03-07 DIAGNOSIS — I110 Hypertensive heart disease with heart failure: Secondary | ICD-10-CM | POA: Diagnosis not present

## 2021-03-07 DIAGNOSIS — J449 Chronic obstructive pulmonary disease, unspecified: Secondary | ICD-10-CM | POA: Diagnosis not present

## 2021-03-07 DIAGNOSIS — Z8546 Personal history of malignant neoplasm of prostate: Secondary | ICD-10-CM | POA: Diagnosis not present

## 2021-03-07 DIAGNOSIS — E1140 Type 2 diabetes mellitus with diabetic neuropathy, unspecified: Secondary | ICD-10-CM | POA: Diagnosis not present

## 2021-03-07 LAB — BASIC METABOLIC PANEL
Anion gap: 4 — ABNORMAL LOW (ref 5–15)
BUN: 28 mg/dL — ABNORMAL HIGH (ref 8–23)
CO2: 22 mmol/L (ref 22–32)
Calcium: 8.7 mg/dL — ABNORMAL LOW (ref 8.9–10.3)
Chloride: 109 mmol/L (ref 98–111)
Creatinine, Ser: 1.19 mg/dL (ref 0.61–1.24)
GFR, Estimated: 58 mL/min — ABNORMAL LOW (ref >60)
Glucose, Bld: 168 mg/dL — ABNORMAL HIGH (ref 70–99)
Potassium: 4.7 mmol/L (ref 3.5–5.1)
Sodium: 135 mmol/L (ref 135–145)

## 2021-03-07 LAB — CBC
HCT: 30 % — ABNORMAL LOW (ref 39.0–52.0)
Hemoglobin: 10 g/dL — ABNORMAL LOW (ref 13.0–17.0)
MCH: 32.6 pg (ref 26.0–34.0)
MCHC: 33.3 g/dL (ref 30.0–36.0)
MCV: 97.7 fL (ref 80.0–100.0)
Platelets: 189 10*3/uL (ref 150–400)
RBC: 3.07 MIL/uL — ABNORMAL LOW (ref 4.22–5.81)
RDW: 14 % (ref 11.5–15.5)
WBC: 9.2 10*3/uL (ref 4.0–10.5)
nRBC: 0 % (ref 0.0–0.2)

## 2021-03-07 IMAGING — CT CT HEAD W/O CM
4 of 5 series · 15 of 47 positions shown, 16 images · non-contrast
Comparison: None.

CLINICAL DATA: Headache

EXAM:
CT HEAD WITHOUT CONTRAST
TECHNIQUE: Contiguous axial images were obtained from the base of the skull
through the vertex without intravenous contrast.

[Series 2: head wo · axial · 0.43mm/px · z∈[-72,+18]mm · 4 of 30 slices shown, 5 images]
[im 6/30  brain]
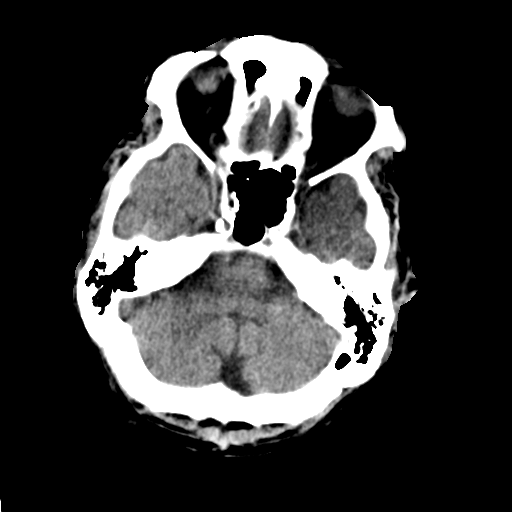
[im 6/30  bone]
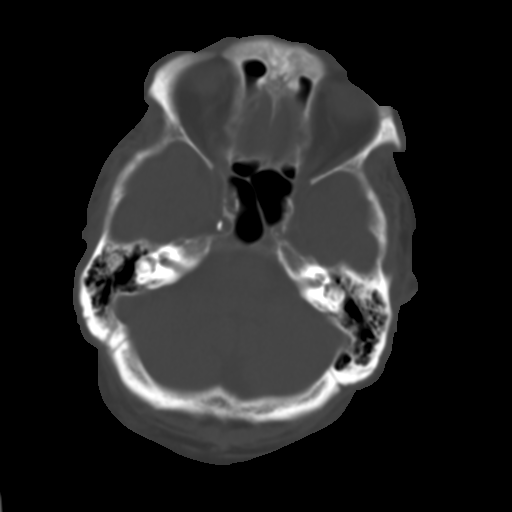
[im 12/30  brain]
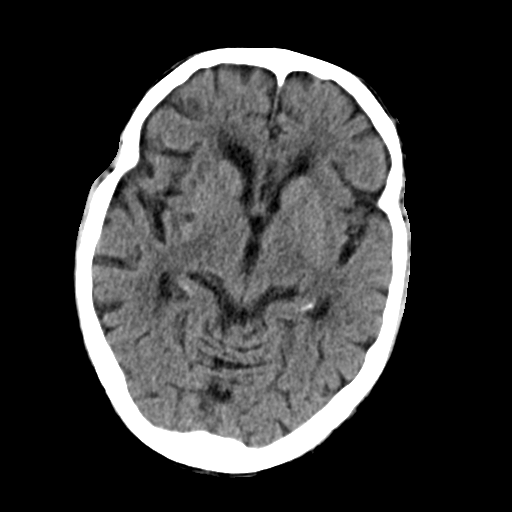
[im 18/30  brain]
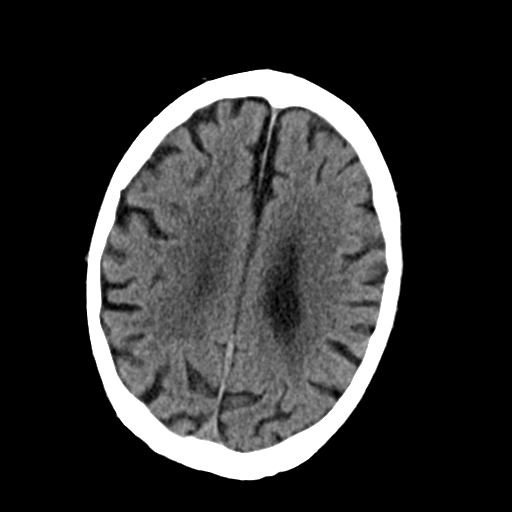
[im 24/30  brain]
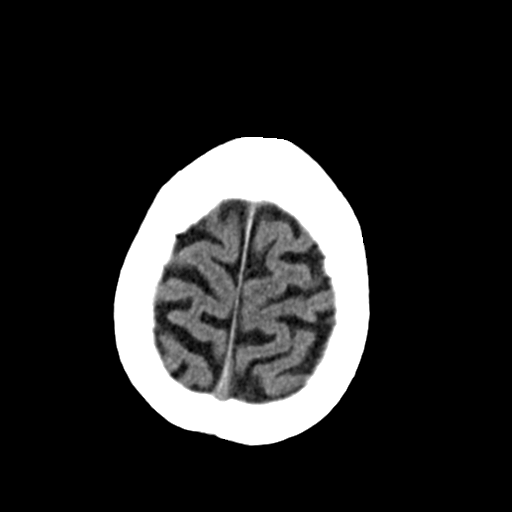

[Series 4: ax head wo · axial · 0.32mm/px · z∈[-80,+18]mm · 5 of 31 slices shown]
[im 6/31  brain]
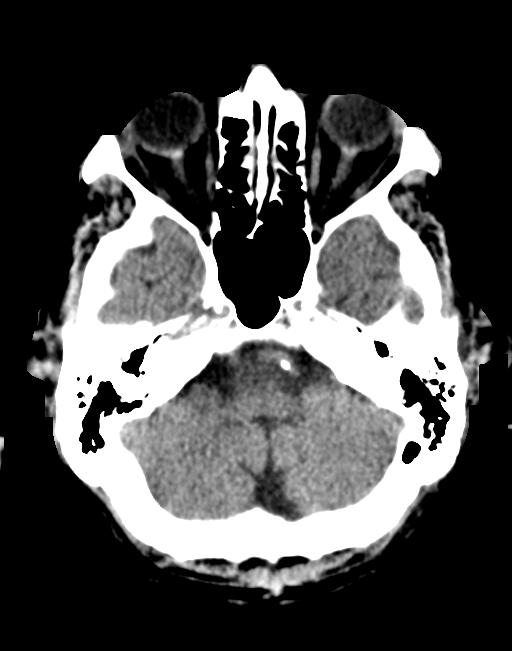
[im 11/31  brain]
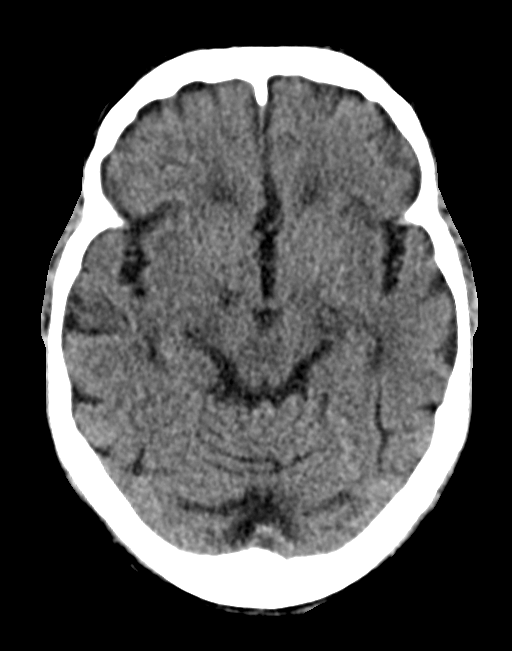
[im 16/31  brain]
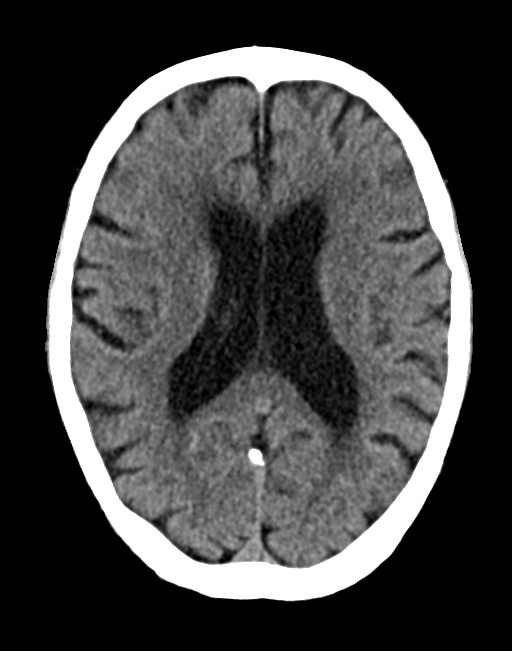
[im 21/31  brain]
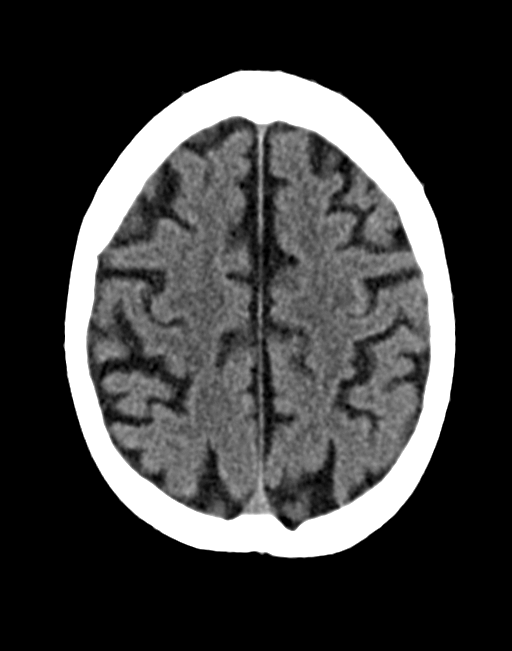
[im 26/31  brain]
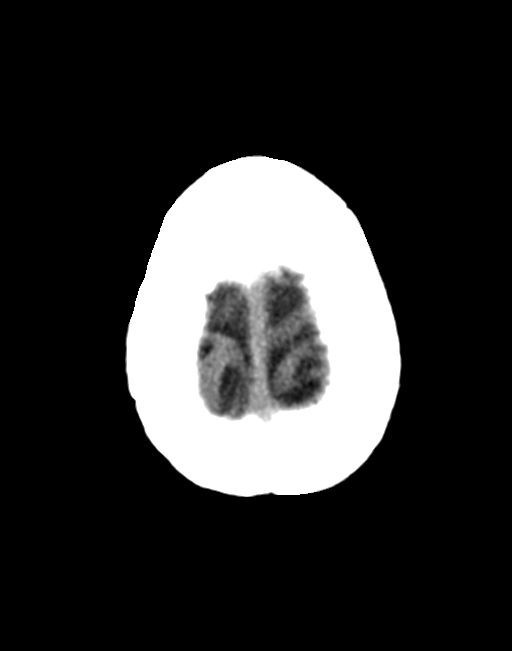

[Series 5: coronal soft tissue · coronal · 0.30mm/px · 3 of 62 slices shown]
[im 21/62  brain]
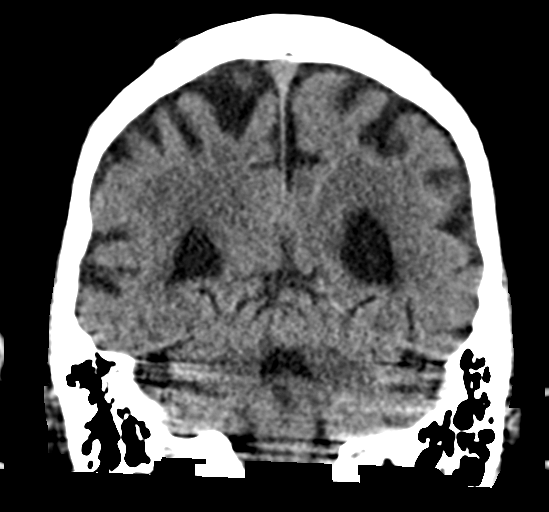
[im 28/62  brain]
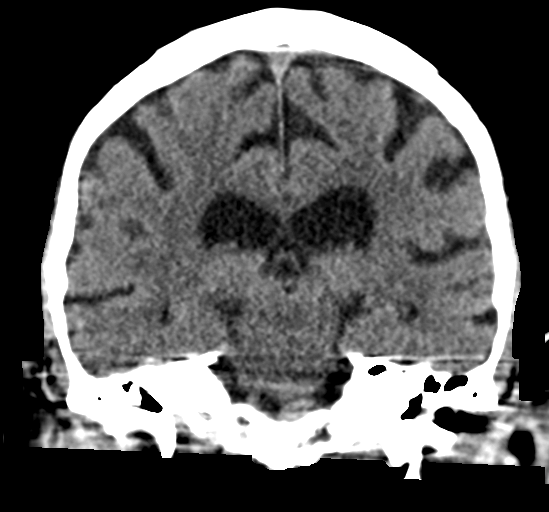
[im 34/62  brain]
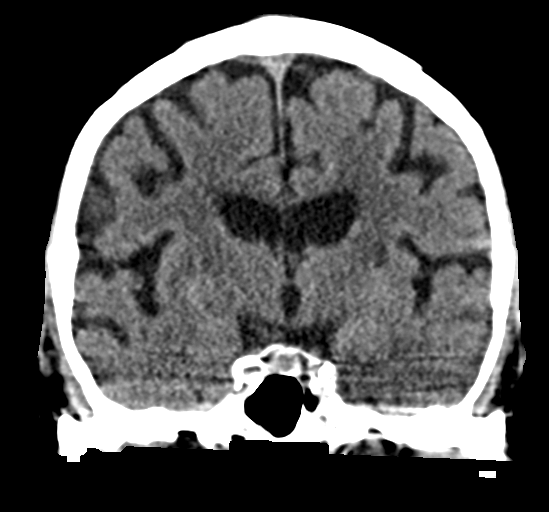

[Series 6: sagittal soft tissue · sagittal · 0.31mm/px · 3 of 51 slices shown]
[im 17/51  brain]
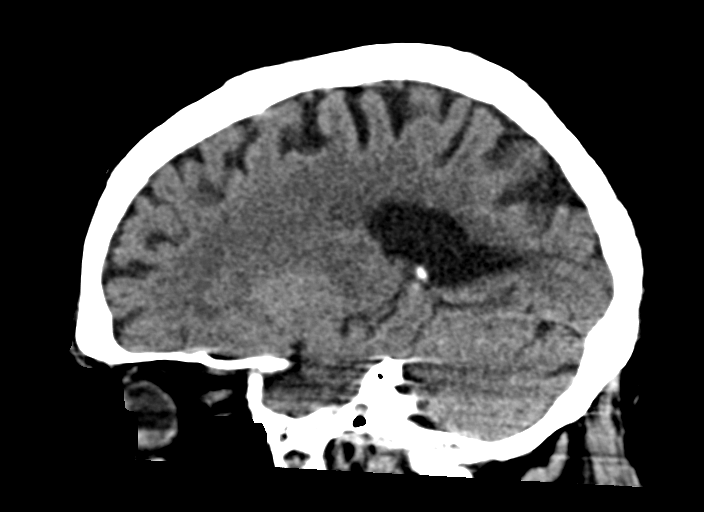
[im 26/51  brain]
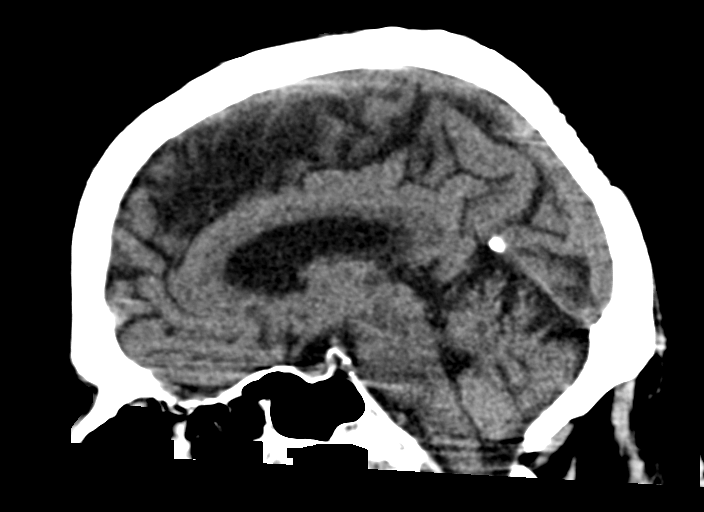
[im 34/51  brain]
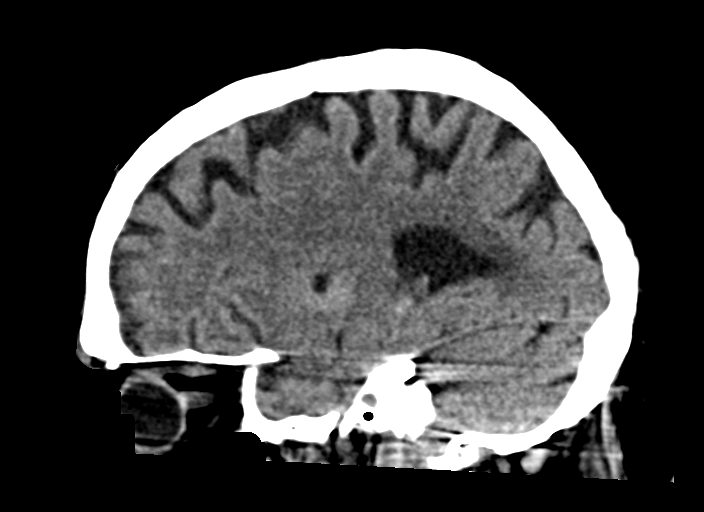

[15 of 47 positions shown; findings below may reference images not displayed]

BRAIN:
BRAIN
Cerebral ventricle sizes are concordant with the degree of cerebral
volume loss. Patchy and confluent areas of decreased attenuation are
noted throughout the deep and periventricular white matter of the
cerebral hemispheres bilaterally, compatible with chronic
microvascular ischemic disease. Possible chronic right basal ganglia
infarction.

No evidence of large-territorial acute infarction. No parenchymal
hemorrhage. No mass lesion. No extra-axial collection.

No mass effect or midline shift. No hydrocephalus. Basilar cisterns
are patent.

Vascular: No hyperdense vessel. Atherosclerotic calcifications are
present within the cavernous internal carotid arteries.

Skull: No acute fracture or focal lesion.

Sinuses/Orbits: Paranasal sinuses and mastoid air cells are clear.
Right lens replacement. Otherwise orbits are unremarkable.

Other: None.
IMPRESSION: No acute intracranial abnormality.

## 2021-03-07 MED ORDER — ACETAMINOPHEN 325 MG PO TABS
650.0000 mg | ORAL_TABLET | Freq: Once | ORAL | Status: AC
  Administered 2021-03-07: 19:00:00 650 mg via ORAL
  Filled 2021-03-07: qty 2

## 2021-03-07 NOTE — ED Provider Notes (Signed)
Emergency Medicine Provider Triage Evaluation Note  Matthew Miles. , a 87 y.o. male  was evaluated in triage.  Pt complains of headache.  Patient reports headache for the last week, with sharp increase of last 2 to 3 days.  Been off and on with some benefit with Tylenol Motrin.  He denies any vision disturbance, nausea, vomiting, dizziness, weakness or syncope.  He is a patient with lymphoma currently under chemotherapy care at [facility].  Review of Systems  Positive: Headache Negative: NVD, vision change  Physical Exam  BP (!) 123/55 (BP Location: Left Arm)   Pulse 86   Temp 98.4 F (36.9 C) (Oral)   Resp 20   Ht 5\' 3"  (1.6 m)   Wt 77 kg   SpO2 92%   BMI 30.07 kg/m  Gen:   Awake, no distress  NAD Resp:  Normal effort CTA MSK:   Moves extremities without difficulty  Other:  CVS: RRR  Medical Decision Making  Medically screening exam initiated at 2:40 PM.  Appropriate orders placed.  Matthew Miles. was informed that the remainder of the evaluation will be completed by another provider, this initial triage assessment does not replace that evaluation, and the importance of remaining in the ED until their evaluation is complete.  Geriatric patient ED evaluation of 1 week of headache with symptoms that have slightly worsened over the last 2 to 3 days.   Melvenia Needles, PA-C 03/07/21 1441    Naaman Plummer, MD 03/08/21 [phone]

## 2021-03-07 NOTE — ED Triage Notes (Signed)
Pt via POV from home. Pt c/o headache for the past 2-3 days but per  notes its been on and off for the past 3 weeks.  Denies NVD. Denies blurred vision. Denies dizziness. States that the OTC medication is not help.   Pt does have lymphoma is actively on chemotherapy.

## 2021-03-07 NOTE — Telephone Encounter (Signed)
I called and LVM to check on the patient and his headache and to see if he was evaluated at the Ed or urgent care, I left the phone number and asked them to call the office tomorrow.  Emagene Merfeld,cma

## 2021-03-08 ENCOUNTER — Telehealth: Payer: Self-pay | Admitting: Family Medicine

## 2021-03-08 ENCOUNTER — Emergency Department
Admission: EM | Admit: 2021-03-08 | Discharge: 2021-03-08 | Disposition: A | Discharge status: Home / Self Care | Payer: Medicare HMO | Attending: Emergency Medicine | Admitting: Emergency Medicine

## 2021-03-08 DIAGNOSIS — R519 Headache, unspecified: Secondary | ICD-10-CM

## 2021-03-08 LAB — RESP PANEL BY RT-PCR (FLU A&B, COVID) ARPGX2
Influenza A by PCR: NEGATIVE
Influenza B by PCR: NEGATIVE
SARS Coronavirus 2 by RT PCR: NEGATIVE

## 2021-03-08 MED ORDER — LACTATED RINGERS IV BOLUS
1000.0000 mL | Freq: Once | INTRAVENOUS | Status: AC
  Administered 2021-03-08: 01:00:00 1000 mL via INTRAVENOUS

## 2021-03-08 MED ORDER — ACETAMINOPHEN 500 MG PO TABS
1000.0000 mg | ORAL_TABLET | Freq: Once | ORAL | Status: AC
  Administered 2021-03-08: 01:00:00 1000 mg via ORAL
  Filled 2021-03-08: qty 2

## 2021-03-08 NOTE — Telephone Encounter (Signed)
Fyi sonnenberg  Sarah,   Please call son and patient  Please get more info regarding HA and left arm pain   He needs to be seen asap and so sorry he has been in ED and UC.   Please triage and ensure that he is not having vision changes, confusion, trouble finding words.   We have held an appt for him tomorrow with Dutch Quint and he would need to come in person.   BP Readings from Last 3 Encounters:  03/08/21 (!) 128/53  03/04/21 (!) 111/52  02/15/21 110/70

## 2021-03-08 NOTE — Discharge Instructions (Signed)
You have been seen in the Emergency Department (ED) for a headache. Your evaluation today was overall reassuring. Headaches have many possible causes. Most headaches aren't a sign of a more serious problem, and they will get better on their own.   Follow-up with your doctor in 12-24 hours if you are still having a headache. Otherwise follow up with your doctor in 3-5 days.  For pain take tylenol 1000 mg 3 times a day as needed for headaches.   When should you call for help?  Call 911 or return to the ED anytime you think you may need emergency care. For example, call if:  You have signs of a stroke. These may include:  Sudden numbness, paralysis, or weakness in your face, arm, or leg, especially on only one side of your body.  Sudden vision changes.  Sudden trouble speaking.  Sudden confusion or trouble understanding simple statements.  Sudden problems with walking or balance.  A sudden, severe headache that is different from past headaches. You have new or worsening headache Nausea and vomiting associated with your headache Fever, neck stiffness associated with your headache  Call your doctor now or seek immediate medical care if:  You have a new or worse headache.  Your headache gets much worse.  How can you care for yourself at home?  Do not drive if you have taken a prescription pain medicine.  Rest in a quiet, dark room until your headache is gone. Close your eyes and try to relax or go to sleep. Don't watch TV or read.  Put a cold, moist cloth or cold pack on the painful area for 10 to 20 minutes at a time. Put a thin cloth between the cold pack and your skin.  Use a warm, moist towel or a heating pad set on low to relax tight shoulder and neck muscles.  Have someone gently massage your neck and shoulders.  Take pain medicines exactly as directed.  If the doctor gave you a prescription medicine for pain, take it as prescribed.  If you are not taking a prescription pain medicine,  ask your doctor if you can take an over-the-counter medicine. Be careful not to take pain medicine more often than the instructions allow, because you may get worse or more frequent headaches when the medicine wears off.  Do not ignore new symptoms that occur with a headache, such as a fever, weakness or numbness, vision changes, or confusion. These may be signs of a more serious problem.  To prevent headaches  Keep a headache diary so you can figure out what triggers your headaches. Avoiding triggers may help you prevent headaches. Record when each headache began, how long it lasted, and what the pain was like (throbbing, aching, stabbing, or dull). Write down any other symptoms you had with the headache, such as nausea, flashing lights or dark spots, or sensitivity to bright light or loud noise. Note if the headache occurred near your period. List anything that might have triggered the headache, such as certain foods (chocolate, cheese, wine) or odors, smoke, bright light, stress, or lack of sleep.  Find healthy ways to deal with stress. Headaches are most common during or right after stressful times. Take time to relax before and after you do something that has caused a headache in the past.  Try to keep your muscles relaxed by keeping good posture. Check your jaw, face, neck, and shoulder muscles for tension, and try relaxing them. When sitting at a desk, change  positions often, and stretch for 30 seconds each hour.  Get plenty of sleep and exercise.  Eat regularly and well. Long periods without food can trigger a headache.  Treat yourself to a massage. Some people find that regular massages are very helpful in relieving tension.  Limit caffeine by not drinking too much coffee, tea, or soda. But don't quit caffeine suddenly, because that can also give you headaches.  Reduce eyestrain from computers by blinking frequently and looking away from the computer screen every so often. Make sure you have  proper eyewear and that your monitor is set up properly, about an arm's length away.  Seek help if you have depression or anxiety. Your headaches may be linked to these conditions. Treatment can both prevent headaches and help with symptoms of anxiety or depression.

## 2021-03-08 NOTE — Telephone Encounter (Signed)
Please advise, no available appointments with PCP available until end of December.   Patient has been evaluated by ED and urgent care

## 2021-03-08 NOTE — Telephone Encounter (Signed)
Matthew Miles Patient you are seeing tomorrow He has been seen in ED,  and prior UC.

## 2021-03-08 NOTE — Telephone Encounter (Signed)
I spoke with patient's son, Harlin. He stated that patient has really had a full work up, but nothing has been found. He said that patient just having odd symptoms & him as well as sister thought maybe due to cancer treatment he has about a month ago. They have also reached out to his oncology team & they did not feel related. He does have an appointment scheduled with them for Wednesday. He said that his headache has been above his eyebrows & at times patient reported a stabbing. They had wondered about patient's sinuses but appeared clear on CT of head. Pt has had no other symptoms & no congestion. Pt was going to reach out to eye doctor to see if related. Pt eyes stay watery, so vision is often blurry. Left arm pain had patient concerned & that is why he decided to go to ED. Full work up was again not revealing. Pt's son said he did fall off the bed while breaking left hand this year. Not sure if related bc he did have some left wrist swelling recently that went into fingers especially thumb. Pt feeling better after getting fluids at ED & given tylenol. He is scheduled with Padonda at 3:45 tomorrow.

## 2021-03-08 NOTE — ED Provider Notes (Signed)
[facility] Emergency Department Provider Note  ____________________________________________  Time seen: Approximately 12:36 AM  I have reviewed the triage vital signs and the nursing notes.   HISTORY  Chief Complaint Headache   HPI Matthew Miles. is a 87 y.o. male with a history of of CHF with preserved EF, COPD, CAD, diabetes, prostate cancer status post radiation therapy, hypertension, lymphoma on rituximab who presents for evaluation of a headache.  Patient reports that he has had a headache for the last week.  He has been taking Tylenol and ibuprofen at home which resolves the headache but it has returned.  The headache is frontal, sharp, intermittent.  As its worse the pain is 7/10, currently 5/10.  He denies congestion, visual changes, dizziness, nausea or vomiting, sore throat, cough, fever, chest pain, shortness of breath, diplopia, dysarthria, dysphagia, unilateral weakness or numbness, facial droop, slurred speech, fever, neck stiffness.  Patient denies history of chronic headaches.  Past Medical History:  Diagnosis Date   Allergy    Arthritis    CHF (congestive heart failure) (HCC)    Chicken pox    Cholecystitis    Colon polyps    COPD (chronic obstructive pulmonary disease) (HCC)    Coronary artery disease    Diabetes mellitus without complication (HCC)    Emphysema of lung (HCC)    GERD (gastroesophageal reflux disease)    Heart murmur    Hematemesis/vomiting blood 04/10/11   Hypercholesterolemia    Mild hypertension    Pancreatitis    Prostate cancer ([place])    Prostate cancer ([place])    radiation therapy    Smoker    Ulcer    Upper GI bleed 1980    Patient Active Problem List   Diagnosis Date Noted   Osteopenia 02/15/2021   B12 deficiency 07/31/2020   PAD (peripheral artery disease) ([place]) 01/09/2020   Chronic venous insufficiency 01/09/2020   Elevated TSH 01/01/2020   Decreased pedal pulses 01/01/2020   Diabetic neuropathy  ([place]) 12/31/2018   Diabetic retinopathy ([place]) 10/07/2018   Monoclonal B-cell lymphocytosis 07/18/2018   Prostate cancer ([place]) 07/04/2018   BPH (benign prostatic hyperplasia) 02/10/2017   Actinic keratoses 12/24/2015   Osteoarthritis of both hands 12/24/2015   Obesity (BMI 30-39.9) 10/21/2014   Essential hypertension 07/21/2014   Murmur, cardiac 10/15/2013   Chronic low back pain 10/08/2013   Chronic diastolic CHF (congestive heart failure) ([place]) 04/15/2013   Thrombocytopenia ([place]) 08/21/2012   Urge incontinence 07/02/2012   Hyperlipidemia 06/08/2012   H/O malignant neoplasm of prostate 06/04/2012   Diabetes ([place]) 04/26/2012   Iron deficiency anemia 01/06/2012   Pulmonary hypertension ([place]) 11/25/2011   GERD (gastroesophageal reflux disease) 06/13/2011   Coronary artery disease of native artery of native heart with stable angina pectoris ([place]) 04/26/2011   COPD (chronic obstructive pulmonary disease) [facility])     Past Surgical History:  Procedure Laterality Date   APPENDECTOMY     CARDIAC CATHETERIZATION  May 2002 and Feb 2013   [facility]; no stents    CATARACT EXTRACTION     CHOLECYSTECTOMY     CIRCUMCISION     COLONOSCOPY     HEMORRHOID SURGERY     SP CHOLECYSTOMY     STOMACH SURGERY     bleeding ulcers, followed by Dr. [name]    Prior to Admission medications   Medication Sig Start Date End Date Taking? Authorizing Provider  Accu-Chek FastClix Lancets MISC Inject 1 each into the skin in the morning and at bedtime.  02/18/21   [name], MD  ACCU-CHEK GUIDE test strip TEST BLOOD SUGAR TWICE DAILY AS DIRECTED 02/22/21   [name], MD  alfuzosin (UROXATRAL) 10 MG 24 hr tablet Take 1 tablet (10 mg total) by mouth daily with breakfast. 08/27/19   [name], MD  aspirin 81 MG tablet Take 81 mg by mouth daily.     [provider]  blood glucose meter kit and supplies KIT Dispense based on patient and insurance preference. Use once daily as directed.  Dx Code E11.9 02/15/21   [name], MD  Calcium Citrate-Vitamin D (CALCIUM CITRATE + D PO) Take by mouth. 1200 mg of calcium and [phone] units of vitamin D daily    [provider]  carvedilol (COREG) 6.25 MG tablet TAKE 1 TABLET TWICE DAILY 11/09/20   Minna Merritts, MD  cephALEXin (KEFLEX) 500 MG capsule Take 1 capsule (500 mg total) by mouth 3 (three) times daily. 11/27/20   Paulette Blanch, MD  ferrous sulfate 325 (65 FE) MG EC tablet Take 325 mg by mouth daily.    [provider]  metFORMIN (GLUCOPHAGE-XR) 500 MG 24 hr tablet TAKE 2 TABLETS(1000 MG) BY MOUTH TWICE DAILY 09/21/20   [name], MD  mupirocin ointment (BACTROBAN) 2 % 1 application 2 (two) times daily as needed. Patient not taking: Reported on 02/15/2021    [provider]  mupirocin ointment (BACTROBAN) 2 % Apply topically.    [provider]  nitroGLYCERIN (NITROSTAT) 0.4 MG SL tablet Place 1 tablet (0.4 mg total) under the tongue every 5 (five) minutes as needed. 11/09/20   Minna Merritts, MD  pantoprazole (PROTONIX) 20 MG tablet TAKE 1 TABLET EVERY DAY 12/04/20   [name], MD  potassium chloride (KLOR-CON) 10 MEQ tablet Take 1 tablet (10 mEq total) by mouth every other day. 11/09/20   Minna Merritts, MD  pravastatin (PRAVACHOL) 40 MG tablet Take 1 tablet (40 mg total) by mouth daily. 11/09/20   Minna Merritts, MD  PROAIR HFA 108 [phone] Base) MCG/ACT inhaler INHALE 2 PUFFS INTO THE LUNGS EVERY 6 HOURS AS NEEDED FOR WHEEZING OR SHORTNESS OF BREATH 10/03/16   [name], MD  Tiotropium Bromide Monohydrate (SPIRIVA RESPIMAT) 2.5 MCG/ACT AERS Inhale 2 puffs into the lungs daily. 04/01/20   [name], MD  torsemide (DEMADEX) 10 MG tablet TAKE 1 TABLET(10 MG) BY MOUTH TWICE DAILY AS NEEDED Patient taking differently: Take 20 mg by mouth daily. Per [name] 11/09/20   Minna Merritts, MD  TRULICITY 1.5 NV/9.85YO SOPN INJECT 1.5MG (1 PEN) SUBCUTANEOUSLY EVERY WEEK  12/15/20   [name], MD    Allergies Oxybutynin, Sulfa antibiotics, and Tradjenta [linagliptin]  Family History  Problem Relation Age of Onset   Prostate cancer Father    Liver cancer Brother    Prostate cancer Brother    Emphysema Sister        smoker    Social History Social History   Tobacco Use   Smoking status: Former    Packs/day: 1.00    Years: 65.00    Pack years: 65.00    Types: Cigarettes    Quit date: 04/10/2011    Years since quitting: 9.9   Smokeless tobacco: Former    Types: Nurse, children's Use: Former  Substance Use Topics   Alcohol use: Yes    Comment: 1 beer rarely   Drug use: No  Review of Systems  Constitutional: Negative for fever. Eyes: Negative for visual changes. ENT: Negative for sore throat. Neck: No neck pain  Cardiovascular: Negative for chest pain. Respiratory: Negative for shortness of breath. Gastrointestinal: Negative for abdominal pain, vomiting or diarrhea. Genitourinary: Negative for dysuria. Musculoskeletal: Negative for back pain. Skin: Negative for rash. Neurological: Negative for weakness or numbness. + HA Psych: No SI or HI  ____________________________________________   PHYSICAL EXAM:  VITAL SIGNS: ED Triage Vitals  Enc Vitals Group     BP 03/07/21 1421 (!) 123/55     Pulse Rate 03/07/21 1421 86     Resp 03/07/21 1421 20     Temp 03/07/21 1421 98.4 F (36.9 C)     Temp Source 03/07/21 1421 Oral     SpO2 03/07/21 1421 92 %     Weight 03/07/21 1425 169 lb 12.1 oz (77 kg)     Height 03/07/21 1425 5' 3"  (1.6 m)     Head Circumference --      Peak Flow --      Pain Score 03/07/21 1422 5     Pain Loc --      Pain Edu? --      Excl. in [place]? --     Constitutional: Alert and oriented. Well appearing and in no apparent distress. HEENT:      Head: Normocephalic and atraumatic.         Eyes: Conjunctivae are normal. Sclera is non-icteric.       Mouth/Throat: Mucous membranes are moist.        Neck: Supple with no signs of meningismus. Cardiovascular: Regular rate and rhythm. No murmurs, gallops, or rubs. 2+ symmetrical distal pulses are present in all extremities. No JVD. Respiratory: Normal respiratory effort. Lungs are clear to auscultation bilaterally.  Gastrointestinal: Soft, non tender. Musculoskeletal:  No edema, cyanosis, or erythema of extremities. Neurologic: Normal speech and language. PERRL, EOMI, normal strength and sensation x4, no pronator drift, no dysmetria Skin: Skin is warm, dry and intact. No rash noted. Psychiatric: Mood and affect are normal. Speech and behavior are normal.  ____________________________________________   LABS (all labs ordered are listed, but only abnormal results are displayed)  Labs Reviewed  CBC - Abnormal; Notable for the following components:      Result Value   RBC 3.07 (*)    Hemoglobin 10.0 (*)    HCT 30.0 (*)    All other components within normal limits  BASIC METABOLIC PANEL - Abnormal; Notable for the following components:   Glucose, Bld 168 (*)    BUN 28 (*)    Calcium 8.7 (*)    GFR, Estimated 58 (*)    Anion gap 4 (*)    All other components within normal limits  RESP PANEL BY RT-PCR (FLU A&B, COVID) [id number]   ____________________________________________  EKG  none  ____________________________________________  RADIOLOGY  I have personally reviewed the images performed during this visit and I agree with the Radiologist's read.   Interpretation by Radiologist:  CT HEAD WO CONTRAST (5MM)  Result Date: 03/07/2021 CLINICAL DATA:  Headache EXAM: CT HEAD WITHOUT CONTRAST TECHNIQUE: Contiguous axial images were obtained from the base of the skull through the vertex without intravenous contrast. COMPARISON:  None. BRAIN: BRAIN Cerebral ventricle sizes are concordant with the degree of cerebral volume loss. Patchy and confluent areas of decreased attenuation are noted throughout the deep and periventricular white  matter of the cerebral hemispheres bilaterally, compatible with chronic microvascular ischemic disease. Possible chronic  right basal ganglia infarction. No evidence of large-territorial acute infarction. No parenchymal hemorrhage. No mass lesion. No extra-axial collection. No mass effect or midline shift. No hydrocephalus. Basilar cisterns are patent. Vascular: No hyperdense vessel. Atherosclerotic calcifications are present within the cavernous internal carotid arteries. Skull: No acute fracture or focal lesion. Sinuses/Orbits: Paranasal sinuses and mastoid air cells are clear. Right lens replacement. Otherwise orbits are unremarkable. Other: None. IMPRESSION: No acute intracranial abnormality. Electronically Signed   By: Iven Finn M.D.   On: 03/07/2021 15:14     ____________________________________________   PROCEDURES  Procedure(s) performed: None Procedures   Critical Care performed:  None ____________________________________________   INITIAL IMPRESSION / ASSESSMENT AND PLAN / ED COURSE  87 y.o. male with a history of of CHF with preserved EF, COPD, CAD, diabetes, prostate cancer status post radiation therapy, hypertension, lymphoma on rituximab who presents for evaluation of a headache.  Patient with intermittent headaches for about a week.  He is otherwise well-appearing in no distress with normal vital signs, no meningeal signs, normal neurological exam.  Head CT visualized by me with no signs of intracranial pathology such as metastatic disease or brain bleed.  No signs of meningitis with no fever, no neck stiffness, and an otherwise well-appearing patient after 7 days of symptoms.  No signs of stroke on exam. Son is at bedside and is concerned that this could be side effect of patient's chemotherapy since symptoms seem to have started after he received his last treatment however patient has been on this medication since April.  They have called the patient's oncologist but have not  been able to get a hold of them yet.  Unfortunately, patient has been sitting in the waiting room for 11 hours to be seen.  Has not had much to eat or drink today.  Some of his headache could be part dehydration versus a sinus headache versus side effect of medications versus viral syndrome.  His labs are within baseline with no significant electrolyte derangements.  Will give IV fluids, p.o. Tylenol, and swab patient for COVID and flu.    _________________________ 1:46 AM on 03/08/2021 ----------------------------------------- COVID and flu negative.  Patient remains extremely well-appearing and neurologically intact.  Recommended close follow-up with his primary care doctor and oncologist.  At this time he is stable for discharge home to the care of his son.  Discussed my standard return precautions      _____________________________________________ Please note:  Patient was evaluated in Emergency Department today for the symptoms described in the history of present illness. Patient was evaluated in the context of the global COVID-19 pandemic, which necessitated consideration that the patient might be at risk for infection with the SARS-CoV-2 virus that causes COVID-19. Institutional protocols and algorithms that pertain to the evaluation of patients at risk for COVID-19 are in a state of rapid change based on information released by regulatory bodies including the CDC and federal and state organizations. These policies and algorithms were followed during the patient's care in the ED.  Some ED evaluations and interventions may be delayed as a result of limited staffing during the pandemic.   [place] Controlled Substance Database was reviewed by me. ____________________________________________   FINAL CLINICAL IMPRESSION(S) / ED DIAGNOSES   Final diagnoses:  Acute nonintractable headache, unspecified headache type      NEW MEDICATIONS STARTED DURING THIS VISIT:  ED Discharge Orders      None        Note:  This document was prepared using  [employer] forces training and education officer and may include unintentional dictation errors.    Rudene Re, MD 03/08/21 [id number]

## 2021-03-08 NOTE — Telephone Encounter (Signed)
Pts son called in regards to pt experiencing left arm pain and severe headaches. Pt was seen in ED for left arm pain on Thursday 11/24. They were unable to find anything regarding the heart or blood work.Pt is also experiencing on/off headaches. Pts son contacted our office where he was advised to take pt to urgent care for headaches. They were seen in [facility] and was advised to go back to the ED to get imaging done. They also were unable to find anything with that. Son believes it could possibly be side effects to cancer treatments. Pt and family were advised to follow up with PCP.   Pts son can be reached at [phone]

## 2021-03-09 ENCOUNTER — Ambulatory Visit: Payer: Medicare HMO | Admitting: Family

## 2021-03-09 ENCOUNTER — Telehealth: Payer: Self-pay

## 2021-03-09 NOTE — Telephone Encounter (Signed)
Awaiting daughter to call back to move patient to an appropriate visit.

## 2021-03-09 NOTE — Telephone Encounter (Signed)
I spoke with patient's daughter who stated that her brother with whom I spoke yesterday was at a doctors appointment. She would get in touch with him to see if the appointment with Sharyn Lull held at 1:30 would work or if they could come Thursday at one of the blocked times since [place] plans to open schedule. She will call back to let me know. I asked her to speak to me. Appointment today with Abby Potash cancelled as I explained that patient too complex for 15 minute acute appointment.

## 2021-03-09 NOTE — Progress Notes (Signed)
Acute Office Visit  Subjective:    Patient ID: Matthew Miles., male    DOB: [birth year], 87 y.o.   MRN: [id number]  Chief Complaint  Patient presents with   Follow-up    Headache  This is a chronic problem. The current episode started 1 to 4 weeks ago (son says has had headache several months but has worsened in the past 2 weeks.). The problem occurs intermittently. The problem has been gradually improving. The pain is located in the Frontal region. The pain does not radiate. The pain quality is similar to prior headaches. The quality of the pain is described as aching. Associated symptoms include sinus pressure. Pertinent negatives include no abdominal pain, abnormal behavior, anorexia, back pain, blurred vision, coughing, dizziness, drainage, ear pain, eye pain, eye redness, eye watering, facial sweating, fever, hearing loss, insomnia, loss of balance, muscle aches, nausea, neck pain, numbness, phonophobia, photophobia, rhinorrhea, scalp tenderness, seizures, sore throat, swollen glands, tingling, tinnitus, visual change, vomiting, weakness or weight loss.  Arm Pain  Incident onset: arm pain has resolved left arm since being seen in the er. denies any sympytoms curretly. Pertinent negatives include no numbness or tingling.  Patient is in today for follow up for ED visit he was seen on 03/08/21 at the time reported a headache for the past week. Worse pain  7/10 and was currently 5/10 in the ER. Son reported headache started after his last chemo treatment, he has bee on medication since April. He was found to be neurologically intact and discharged home.  Patient does have lymphoma and is currently on active chemotherapy CT from ER reviewed as below. CLINICAL DATA:  Headache EXAM: CT HEAD WITHOUT CONTRAST TECHNIQUE: Contiguous axial images were obtained from the base of the skull through the vertex without intravenous contrast. COMPARISON:  None. BRAIN: BRAIN Cerebral ventricle sizes  are concordant with the degree of cerebral volume loss. Patchy and confluent areas of decreased attenuation are noted throughout the deep and periventricular white matter of the cerebral hemispheres bilaterally, compatible with chronic microvascular ischemic disease. Possible chronic right basal ganglia infarction. No evidence of large-territorial acute infarction. No parenchymal hemorrhage. No mass lesion. No extra-axial collection. No mass effect or midline shift. No hydrocephalus. Basilar cisterns are patent. Vascular: No hyperdense vessel. Atherosclerotic calcifications are present within the cavernous internal carotid arteries. Skull: No acute fracture or focal lesion. Sinuses/Orbits: Paranasal sinuses and mastoid air cells are clear. Right lens replacement. Otherwise orbits are unremarkable. Other: None. IMPRESSION: No acute intracranial abnormality. Electronically Signed   By: Iven Finn M.D.   On: 03/07/2021 15:14   COMPARISON:  08/10/2012   FINDINGS: Brain: No acute infarct or hemorrhage. Chronic small vessel ischemic changes are seen within the right basal ganglia and periventricular white matter. Lateral ventricles and midline structures are unremarkable. No acute extra-axial fluid collections. No mass effect.  Denies any bleeding.  Patient denies any head falls or trauma.  Patient son also denies any head falls or traumas noted. Prednisone in [place] sent by oncologist. Who felt he may be having some antiinflammatory reactions.  Has eye exam on January trying to move up this appointment.  Dr. Ellin Mayhew in [place].  Has started Claritin OTC for itching and this has resolved.    Cellulitis of right leg treated with doxycycline and for sinusitis. -He had a recheck and this was resolved.  He denies any new or worsening symptoms in his right leg.  Patient  denies any fever, body aches,chills, rash, chest pain,  shortness of breath, nausea, vomiting, or diarrhea.      Past Medical History:  Diagnosis Date   Allergy    Arthritis    CHF (congestive heart failure) (HCC)    Chicken pox    Cholecystitis    Colon polyps    COPD (chronic obstructive pulmonary disease) (HCC)    Coronary artery disease    Diabetes mellitus without complication (HCC)    Emphysema of lung (HCC)    GERD (gastroesophageal reflux disease)    Heart murmur    Hematemesis/vomiting blood 04/10/11   Hypercholesterolemia    Mild hypertension    Pancreatitis    Prostate cancer ([place])    Prostate cancer ([place])    radiation therapy    Smoker    Ulcer    Upper GI bleed 1980    Past Surgical History:  Procedure Laterality Date   APPENDECTOMY     CARDIAC CATHETERIZATION  May 2002 and Feb 2013   [facility]; no stents    CATARACT EXTRACTION     CHOLECYSTECTOMY     CIRCUMCISION     COLONOSCOPY     HEMORRHOID SURGERY     SP CHOLECYSTOMY     STOMACH SURGERY     bleeding ulcers, followed by Dr. 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 Wife in nursing home. Has 2 children.      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      Diet - regular diet   Exercise - bike   Social Determinants of Radio broadcast assistant Strain: Low Risk    Difficulty of Paying Living Expenses: Not hard at all  Food Insecurity: No Food Insecurity   Worried About Charity fundraiser in the Last Year: Never true   Arboriculturist in the Last Year: Never true  Transportation Needs: No Transportation Needs   Lack of Transportation (Medical): No   Lack of Transportation (Non-Medical): No  Physical Activity: Insufficiently Active   Days of Exercise per Week: 3 days   Minutes of Exercise per Session: 30 min  Stress: No Stress Concern Present   Feeling of Stress : Not at all  Social Connections: Unknown   Frequency of Communication with Friends and Family: More than three times a week   Frequency of Social Gatherings with  Friends and Family: More than three times a week   Attends Religious Services: Not on file   Active Member of Clubs or Organizations: Yes   Attends Archivist Meetings: Not on file   Marital Status: Widowed  Human resources officer Violence: Not At Risk   Fear of Current or Ex-Partner: No   Emotionally Abused: No   Physically Abused: No   Sexually Abused: No    Outpatient Medications Prior to Visit  Medication Sig Dispense Refill   Accu-Chek FastClix Lancets MISC Inject 1 each into the skin in the morning and at bedtime. 100 each 4   ACCU-CHEK GUIDE test strip TEST BLOOD SUGAR TWICE DAILY AS DIRECTED 200 strip 2   alfuzosin (UROXATRAL) 10 MG 24 hr tablet Take 1 tablet (10 mg total) by mouth daily with breakfast. 90 tablet 0   aspirin 81 MG tablet Take 81 mg by mouth daily.      blood glucose meter kit and supplies KIT Dispense based on patient and insurance preference. Use once daily as directed. Dx Code E11.9 1 each 0   Calcium Citrate-Vitamin D (CALCIUM CITRATE + D PO) Take by mouth. 1200 mg of calcium and [phone] units of vitamin D daily     carvedilol (COREG) 6.25 MG tablet  TAKE 1 TABLET TWICE DAILY 180 tablet 3   cephALEXin (KEFLEX) 500 MG capsule Take 1 capsule (500 mg total) by mouth 3 (three) times daily. 21 capsule 0   ferrous sulfate 325 (65 FE) MG EC tablet Take 325 mg by mouth daily.     metFORMIN (GLUCOPHAGE-XR) 500 MG 24 hr tablet TAKE 2 TABLETS(1000 MG) BY MOUTH TWICE DAILY 360 tablet 3   mupirocin ointment (BACTROBAN) 2 % 1 application 2 (two) times daily as needed.     mupirocin ointment (BACTROBAN) 2 % Apply topically.     nitroGLYCERIN (NITROSTAT) 0.4 MG SL tablet Place 1 tablet (0.4 mg total) under the tongue every 5 (five) minutes as needed. 25 tablet 3   pantoprazole (PROTONIX) 20 MG tablet TAKE 1 TABLET EVERY DAY 90 tablet 3   potassium chloride (KLOR-CON) 10 MEQ tablet Take 1 tablet (10 mEq total) by mouth every other day. 45 tablet 3   pravastatin (PRAVACHOL) 40 MG tablet Take 1 tablet (40 mg total) by mouth daily. 90 tablet 3   predniSONE (DELTASONE) 20 MG tablet Take 2 tablets(72m) by mouth each morning for 7 days, then 1 tablet (235m by mouth each morning for the next 7 days.     PROAIR HFA 108 (90 Base) MCG/ACT inhaler INHALE 2 PUFFS INTO THE LUNGS EVERY 6 HOURS AS NEEDED FOR WHEEZING OR SHORTNESS OF BREATH 8.5 g 0   Tiotropium Bromide Monohydrate (SPIRIVA RESPIMAT) 2.5 MCG/ACT AERS Inhale 2 puffs into the lungs daily. 4 g 5   torsemide (DEMADEX) 10 MG tablet TAKE 1 TABLET(10 MG) BY MOUTH TWICE DAILY AS NEEDED (Patient taking differently: Take 20 mg by mouth daily. Per Dr. GoRockey [id number] 3   TRULICITY 1.5 MGAV/4.0JWOPN INJECT 1.5MG (1 PEN) SUBCUTANEOUSLY EVERY WEEK 6 mL 1   No facility-administered medications prior to visit.    Allergies  Allergen Reactions   Oxybutynin     Dry mouth and constant urination.   Sulfa Antibiotics     GI upset   Tradjenta [Linagliptin] Other (See Comments)    Hair loss    Review of Systems  Constitutional:  Negative for fever and weight loss.  HENT:  Positive for sinus pressure. Negative for  ear pain, hearing loss, rhinorrhea, sore throat and tinnitus.   Eyes:  Negative for blurred vision, photophobia, pain and redness.  Respiratory:  Negative for cough.   Gastrointestinal:  Negative for abdominal pain, anorexia, nausea and vomiting.  Musculoskeletal:  Negative for back pain and neck pain.  Neurological:  Positive for headaches. Negative for dizziness, tingling, seizures, weakness, numbness and loss of balance.  Psychiatric/Behavioral:  The patient does not have insomnia.       Objective:    Physical Exam Vitals reviewed.  Constitutional:      Appearance: He is obese. He is not ill-appearing, toxic-appearing or diaphoretic.  HENT:     Head: Normocephalic and atraumatic.     Right Ear: External ear normal.     Left Ear: External ear normal.     Ears:     Comments:  Cobblestoning posterior pharynx; bilateral allergic shiners; bilateral TMs air fluid level clear; bilateral nasal turbinates mild edema erythema clear discharge;       Nose: Rhinorrhea present.     Mouth/Throat:     Pharynx: Oropharynx is clear. No oropharyngeal exudate or posterior oropharyngeal erythema.  Eyes:     General: No scleral icterus.       Right eye: No discharge.        Left eye: No discharge.     Extraocular Movements: Extraocular movements intact.     Conjunctiva/sclera: Conjunctivae normal.     Pupils: Pupils are equal, round, and reactive to light.     Comments: Bilateral watery eyes/ itchy using Patanol  drops per patient. Has been advised to see opthalmology at [facility], they are referring.   Cardiovascular:     Rate and Rhythm: Normal rate and regular rhythm.     Pulses: Normal pulses.     Heart sounds: Normal heart sounds. No murmur heard.   No friction rub. No gallop.  Pulmonary:     Effort: Pulmonary effort is normal.     Breath sounds: Normal breath sounds. No stridor. No rhonchi or rales.  Chest:     Chest wall: No tenderness.  Abdominal:     General: There is no distension.      Palpations: Abdomen is soft.  Skin:    General: Skin is warm.     Findings: No erythema, lesion or rash.  Neurological:     Mental Status: He is oriented to person, place, and time.     Motor: No weakness.     Gait: Gait normal.  Psychiatric:        Mood and Affect: Mood normal.        Behavior: Behavior normal.        Thought Content: Thought content normal.        Judgment: Judgment normal.    Vitals with BMI 03/10/2021 03/08/2021 03/08/2021  Height 5' 3"  - -  Weight 181 lbs 6 oz - -  BMI 59.93 - -  Systolic 570 177 939  Diastolic 50 53 48  Pulse 84 78 81     BP (!) 130/50   Pulse 84   Temp (!) 96.2 F (35.7 C) (Skin)   Ht 5' 3"  (1.6 m)   Wt 181 lb 6.4 oz (82.3 kg)   SpO2 94%   BMI 32.13 kg/m  Wt Readings from Last 3 Encounters:  03/10/21 181 lb 6.4 oz (82.3 kg)  03/07/21 169 lb 12.1 oz (77 kg)  03/04/21 170 lb (77.1 kg)    Health Maintenance Due  Topic Date Due  Zoster Vaccines- Shingrix (1 of 2) Never done   COVID-19 Vaccine (5 - Booster for Pfizer series) 03/16/2020   FOOT EXAM  12/31/2020    There are no preventive care reminders to display for this patient.   Lab Results  Component Value Date   TSH 6.44 (H) 02/15/2021   Lab Results  Component Value Date   WBC 9.2 03/07/2021   HGB 10.0 (L) 03/07/2021   HCT 30.0 (L) 03/07/2021   MCV 97.7 03/07/2021   PLT 189 03/07/2021   Lab Results  Component Value Date   NA 135 03/07/2021   K 4.7 03/07/2021   CO2 22 03/07/2021   GLUCOSE 168 (H) 03/07/2021   BUN 28 (H) 03/07/2021   CREATININE 1.19 03/07/2021   BILITOT 0.6 03/04/2021   ALKPHOS 82 03/04/2021   AST 17 03/04/2021   ALT 20 03/04/2021   PROT 6.8 03/04/2021   ALBUMIN 3.5 03/04/2021   CALCIUM 8.7 (L) 03/07/2021   ANIONGAP 4 (L) 03/07/2021   GFR 53.39 (L) 12/30/2019   Lab Results  Component Value Date   CHOL 125 11/03/2020   Lab Results  Component Value Date   HDL 33.60 (L) 11/03/2020   Lab Results  Component Value Date   LDLCALC  55 11/03/2020   Lab Results  Component Value Date   TRIG 184.0 (H) 11/03/2020   Lab Results  Component Value Date   CHOLHDL 4 11/03/2020   Lab Results  Component Value Date   HGBA1C 6.7 (H) 02/15/2021       Assessment & Plan:   Problem List Items Addressed This Visit       Cardiovascular and Mediastinum   Essential hypertension (Chronic)     Other   Acute non intractable tension-type headache - Primary   Nasal discharge with watery eyes   Blood pressure readings for the past 3 noted visits have been on the low side of normal, may need to consider decreasing hypertensive agent.  Patient is asymptomatic right now without any dizziness or lightheadedness.  Patient was seen today at [facility] at the cancer center and started on Medrol daily for 14 days, advised that his blood sugars may elevate and if they are elevated above parameters given that he needs to call the office for adjustment of medications. Headache is reported to be much more mild than previously and has been improving since he was seen in the emergency room.  He has no red flag headache symptoms.  MRI should be considered if patient continues to have persistent headache, CT scan did show right chronic basal ganglia this was present also in 2014.  Patient has already discussed this with [facility] and son reports that [facility] physician reportedly will order the MRI in 2 weeks at follow-up if symptoms are not improved with Medrol.  Patient is currently undergoing treatment for lymphoma at the cancer center as well.  Offered MRI today however patient's son and patient politely declined at this time will call back should any symptoms change and is aware of red flags and when to go the emergency room  Son was also questioning how much vitamin D patient was to be taking, patient is taking 2000 international units daily, per last note from Dr. [name].  Will need to be have this vitamin D level rechecked in 3 months.  Patient does have a  nasal discharge with watery eyes, is currently using Patanol eyedrops daily.  Suggested he may use a rewetting gel drop as well.  He also will  follow-up with ophthalmology as advised previously by [facility] and again today by this provider.  Suggest continue Claritin that was started just a few days ago.  Red Flags discussed. The patient was given clear instructions to go to ER or return to medical center if any red flags develop, symptoms do not improve, worsen or new problems develop. They verbalized understanding.    Return in about 2 weeks (around 03/24/2021), or if symptoms worsen or fail to improve, for at any time for any worsening symptoms, Go to Emergency room/ urgent care if worse.    Marcille Buffy, FNP

## 2021-03-10 ENCOUNTER — Other Ambulatory Visit: Payer: Self-pay

## 2021-03-10 ENCOUNTER — Encounter: Payer: Self-pay | Admitting: Adult Health

## 2021-03-10 ENCOUNTER — Ambulatory Visit (INDEPENDENT_AMBULATORY_CARE_PROVIDER_SITE_OTHER): Payer: Medicare HMO | Admitting: Adult Health

## 2021-03-10 VITALS — BP 130/50 | HR 84 | Temp 96.2°F | Ht 63.0 in | Wt 181.4 lb

## 2021-03-10 DIAGNOSIS — R590 Localized enlarged lymph nodes: Secondary | ICD-10-CM | POA: Diagnosis not present

## 2021-03-10 DIAGNOSIS — H538 Other visual disturbances: Secondary | ICD-10-CM | POA: Diagnosis not present

## 2021-03-10 DIAGNOSIS — G44209 Tension-type headache, unspecified, not intractable: Secondary | ICD-10-CM | POA: Insufficient documentation

## 2021-03-10 DIAGNOSIS — H04209 Unspecified epiphora, unspecified lacrimal gland: Secondary | ICD-10-CM | POA: Insufficient documentation

## 2021-03-10 DIAGNOSIS — R161 Splenomegaly, not elsewhere classified: Secondary | ICD-10-CM | POA: Diagnosis not present

## 2021-03-10 DIAGNOSIS — C8307 Small cell B-cell lymphoma, spleen: Secondary | ICD-10-CM | POA: Diagnosis not present

## 2021-03-10 DIAGNOSIS — J3489 Other specified disorders of nose and nasal sinuses: Secondary | ICD-10-CM | POA: Diagnosis not present

## 2021-03-10 DIAGNOSIS — C61 Malignant neoplasm of prostate: Secondary | ICD-10-CM | POA: Diagnosis not present

## 2021-03-10 DIAGNOSIS — R42 Dizziness and giddiness: Secondary | ICD-10-CM | POA: Diagnosis not present

## 2021-03-10 DIAGNOSIS — D649 Anemia, unspecified: Secondary | ICD-10-CM | POA: Diagnosis not present

## 2021-03-10 DIAGNOSIS — I10 Essential (primary) hypertension: Secondary | ICD-10-CM

## 2021-03-10 DIAGNOSIS — R519 Headache, unspecified: Secondary | ICD-10-CM | POA: Diagnosis not present

## 2021-03-10 DIAGNOSIS — M25512 Pain in left shoulder: Secondary | ICD-10-CM | POA: Diagnosis not present

## 2021-03-10 NOTE — Patient Instructions (Signed)
General Headache Without Cause A headache is pain or discomfort you feel around the head or neck area. There are many causes and types of headaches. In some cases, the cause may not be found. Follow these instructions at home: Watch your condition for any changes. Let your doctor know about them. Take these steps to help with your condition: Managing pain   Take over-the-counter and prescription medicines only as told by your doctor. This includes medicines for pain that are taken by mouth or put on the skin. Lie down in a dark, quiet room when you have a headache. If told, put ice on your head and neck area: Put ice in a plastic bag. Place a towel between your skin and the bag. Leave the ice on for 20 minutes, 2-3 times per day. Take off the ice if your skin turns bright red. This is very important. If you cannot feel pain, heat, or cold, you have a greater risk of damage to the area. If told, put heat on the affected area. Use the heat source that your doctor recommends, such as a moist heat pack or a heating pad. Place a towel between your skin and the heat source. Leave the heat on for 20-30 minutes. Take off the heat if your skin turns bright red. This is very important. If you cannot feel pain, heat, or cold, you have a greater risk of getting burned. Keep lights dim if bright lights bother you or make your headaches worse. Eating and drinking Eat meals on a regular schedule. If you drink alcohol: Limit how much you have to: 0-1 drink a day for women who are not pregnant. 0-2 drinks a day for men. Know how much alcohol is in a drink. In the [place] one drink equals one 12 oz bottle of beer (355 mL), one 5 oz glass of wine (148 mL), or one 1 oz glass of hard liquor (44 mL). Stop drinking caffeine, or drink less caffeine. General instructions  Keep a journal to find out if certain things bring on headaches. For example, write down: What you eat and drink. How much sleep you get. Any  change to your diet or medicines. Get a massage or try other ways to relax. Limit stress. Sit up straight. Do not tighten (tense) your muscles. Do not smoke or use any products that contain nicotine or tobacco. If you need help quitting, ask your doctor. Exercise regularly as told by your doctor. Get enough sleep. This often means 7-9 hours of sleep each night. Keep all follow-up visits. This is important. Contact a doctor if: Medicine does not help your symptoms. You have a headache that feels different than the other headaches. You feel like you may vomit (nauseous) or you vomit. You have a fever. Get help right away if: Your headache: Gets very bad quickly. Gets worse after a lot of physical activity. You have any of these symptoms: You continue to vomit. A stiff neck. Trouble seeing. Your eye or ear hurts. Trouble speaking. Weak muscles or you lose muscle control. You lose your balance or have trouble walking. You feel like you will pass out (faint) or you pass out. You are mixed up (confused). You have a seizure. These symptoms may be an emergency. Get help right away. Call your local emergency services (911 in the [place] Do not wait to see if the symptoms will go away. Do not drive yourself to the hospital. Summary A headache is pain or discomfort that is felt   around the head or neck area. There are many causes and types of headaches. In some cases, the cause may not be found. Keep a journal to help find out what causes your headaches. Watch your condition for any changes. Let your doctor know about them. Contact a doctor if you have a headache that is different from usual, or if medicine does not help your headache. Get help right away if your headache gets very bad, you throw up, you have trouble seeing, you lose your balance, or you have a seizure. This information is not intended to replace advice given to you by your health care provider. Make sure you discuss any  questions you have with your health care provider. Document Revised: 08/26/2020 Document Reviewed: 08/26/2020 Elsevier Patient Education  2022 Elsevier Inc.  

## 2021-03-15 DIAGNOSIS — Z20822 Contact with and (suspected) exposure to covid-19: Secondary | ICD-10-CM | POA: Diagnosis not present

## 2021-03-15 DIAGNOSIS — I444 Left anterior fascicular block: Secondary | ICD-10-CM | POA: Diagnosis not present

## 2021-03-15 DIAGNOSIS — I509 Heart failure, unspecified: Secondary | ICD-10-CM | POA: Diagnosis not present

## 2021-03-15 DIAGNOSIS — M549 Dorsalgia, unspecified: Secondary | ICD-10-CM | POA: Diagnosis not present

## 2021-03-15 DIAGNOSIS — R079 Chest pain, unspecified: Secondary | ICD-10-CM | POA: Diagnosis not present

## 2021-03-15 DIAGNOSIS — R9431 Abnormal electrocardiogram [ECG] [EKG]: Secondary | ICD-10-CM | POA: Diagnosis not present

## 2021-03-15 DIAGNOSIS — E119 Type 2 diabetes mellitus without complications: Secondary | ICD-10-CM | POA: Diagnosis not present

## 2021-03-15 DIAGNOSIS — R072 Precordial pain: Secondary | ICD-10-CM | POA: Diagnosis not present

## 2021-03-15 DIAGNOSIS — R0789 Other chest pain: Secondary | ICD-10-CM | POA: Diagnosis not present

## 2021-03-15 DIAGNOSIS — M7989 Other specified soft tissue disorders: Secondary | ICD-10-CM | POA: Diagnosis not present

## 2021-03-15 DIAGNOSIS — I110 Hypertensive heart disease with heart failure: Secondary | ICD-10-CM | POA: Diagnosis not present

## 2021-03-16 ENCOUNTER — Telehealth: Payer: Self-pay | Admitting: Cardiovascular Disease

## 2021-03-16 ENCOUNTER — Telehealth: Payer: Self-pay | Admitting: Family Medicine

## 2021-03-16 NOTE — Telephone Encounter (Signed)
Pt callback number is [phone]

## 2021-03-16 NOTE — Telephone Encounter (Signed)
Was able to reach back out to pt's son Jadore (DPR approved), he reports Mr. Hackler has been c/o headache, lower back pain (cannot get comfortable), and eye watering. Has been taking nitro for ingestion.  Advised nitro not for ingestion, and regular use of nitro can cause headaches. Be best to try OTC antacids for indigestions.   Davarion voiced concern as he thinks Mr. Theard is worried cancer has spread and "working himself up", stated pt lives alone and likes his independence, but now wants to come stay with him.   Seen in ED yesterday for CP, had a full workup and r/o cardiac work up, also seen twice in the ED prior to yesterday's visit. Also evaluated by oncologist for headache, given steroids, did not seem to help, made back pain worse.   Pt unable to get comfortable with different positions, laying on floor, sitting in chair and laying in bed does not seem to help pain, ED gave Tylenol and eased the pain, has tried to take Tylenol at home, but doesn't seem to help, advised may be taking low dose, would try to increase dosing.   Also advised to seek PCP regarding headaches, ingestions, and back pain, [place] verbalized understanding and will reach out to Dr. Biagio Quint. Offer appt, next available is Jan 7 since recently sen in ED for CP, declined appt, but will call back if they decide they need it, wants to reach out to PCP first.

## 2021-03-16 NOTE — Telephone Encounter (Signed)
Pt son called in stating that Pt has been to the ED about chest and back pain. Pt son stated that Pt have been  to the ED several time. Pt son stated that Pt went to [facility] and they have ran test and blood work. Pt son stated that [facility] doctors stated they couldn't find anything wrong with Pt. Pt son stating that Pt is complaining of severe back pain. Pt son stated that he had reached out to Pt other providers to see what Pt can do about the discomfort and pain. Pt son stated that other providers advise him/Pt to use hot/warm compress and take tylenol for pain. Pt son stated that Pt is taking tylenol and no tylenol is not helping the pain. Pt son would like to know how much tylenol can the patient take or can some one prescribe Pt with pain medication. Pt son requesting callback at

## 2021-03-16 NOTE — Telephone Encounter (Signed)
Pts son calling in regards to pt update. Pt was taking prednisone last Thursday and Friday. Pt stopped taking prednisone due to not being able to sleep. Pt is stating he has back pain/chest pain. Pt was seen at [facility] for this and everything was fine. Pt was given tylenol for his pain. Pt is unable to sleep, experiencing back pain which is making him completely uncomfortable.

## 2021-03-16 NOTE — Telephone Encounter (Signed)
Noted. Can we get him in to the office for evaluation with me or one of the other providers to evaluate his back pain?

## 2021-03-16 NOTE — Telephone Encounter (Signed)
Pt son called in stating that Pt has been to the ED about chest and back pain. Pt son stated that Pt have been  to the ED several time. Pt son stated that Pt went to [facility] and they have ran test and blood work. Pt son stated that [facility] doctors stated they couldn't find anything wrong with Pt. Pt son stating that Pt is complaining of severe back pain. Pt son stated that he had reached out to Pt other providers to see what Pt can do about the discomfort and pain. Pt son stated that other providers advise him/Pt to use hot/warm compress and take tylenol for pain. Pt son stated that Pt is taking tylenol and no tylenol is not helping the pain. Pt son would like to know how much tylenol can the patient take or can some one prescribe Pt with pain medication. Pt son requesting callback at [phone]

## 2021-03-16 NOTE — Telephone Encounter (Signed)
Patient son calling to discuss continued and persistant issues of headache .  Patient has been seen at both [facility] and [facility] ED .   Son is not sure cause of headache but mentions recently patient admits to taking nitro several times for indigestion .  Seedn in ED's for Chest pain L arm pain   Please advise son on concerns above .

## 2021-03-16 NOTE — Telephone Encounter (Signed)
Pts son calling in regards to pt update. Pt was taking prednisone last Thursday and Friday. Pt stopped taking prednisone due to not being able to sleep. Pt is stating he has back pain/chest pain. Pt was seen at [facility] for this and everything was fine. Pt was given tylenol for his pain. Pt is unable to sleep, experiencing back pain which is making him completely uncomfortable. Jed Kutch

## 2021-03-16 NOTE — Telephone Encounter (Signed)
Pt son called in stating that Pt has been to the ED about chest and back pain. Pt son stated that Pt have been  to the ED several time. Pt son stated that Pt went to [facility] and they have ran test and blood work. Pt son stated that [facility] doctors stated they couldn't find anything wrong with Pt. Pt son stating that Pt is complaining of severe back pain. Pt son stated that he had reached out to Pt other providers to see what Pt can do about the discomfort and pain. Pt son stated that other providers advise him/Pt to use hot/warm compress and take tylenol for pain. Pt son stated that Pt is taking tylenol and no tylenol is not helping the pain. Pt son would like to know how much tylenol can the patient take or can some one prescribe Pt with pain medication. Pt son requesting callback at [phone]

## 2021-03-17 DIAGNOSIS — Z9189 Other specified personal risk factors, not elsewhere classified: Secondary | ICD-10-CM | POA: Diagnosis not present

## 2021-03-17 DIAGNOSIS — R630 Anorexia: Secondary | ICD-10-CM | POA: Diagnosis not present

## 2021-03-17 DIAGNOSIS — M4317 Spondylolisthesis, lumbosacral region: Secondary | ICD-10-CM | POA: Diagnosis not present

## 2021-03-17 DIAGNOSIS — C8307 Small cell B-cell lymphoma, spleen: Secondary | ICD-10-CM | POA: Diagnosis not present

## 2021-03-17 DIAGNOSIS — I868 Varicose veins of other specified sites: Secondary | ICD-10-CM | POA: Diagnosis not present

## 2021-03-17 DIAGNOSIS — C884 Extranodal marginal zone B-cell lymphoma of mucosa-associated lymphoid tissue [MALT-lymphoma]: Secondary | ICD-10-CM | POA: Diagnosis not present

## 2021-03-17 DIAGNOSIS — N159 Renal tubulo-interstitial disease, unspecified: Secondary | ICD-10-CM | POA: Diagnosis not present

## 2021-03-17 DIAGNOSIS — M5136 Other intervertebral disc degeneration, lumbar region: Secondary | ICD-10-CM | POA: Diagnosis not present

## 2021-03-17 DIAGNOSIS — E11319 Type 2 diabetes mellitus with unspecified diabetic retinopathy without macular edema: Secondary | ICD-10-CM | POA: Diagnosis not present

## 2021-03-17 DIAGNOSIS — M898X5 Other specified disorders of bone, thigh: Secondary | ICD-10-CM | POA: Diagnosis not present

## 2021-03-17 DIAGNOSIS — N3000 Acute cystitis without hematuria: Secondary | ICD-10-CM | POA: Diagnosis not present

## 2021-03-17 DIAGNOSIS — N1330 Unspecified hydronephrosis: Secondary | ICD-10-CM | POA: Diagnosis not present

## 2021-03-17 DIAGNOSIS — H02055 Trichiasis without entropian left lower eyelid: Secondary | ICD-10-CM | POA: Diagnosis not present

## 2021-03-17 DIAGNOSIS — M549 Dorsalgia, unspecified: Secondary | ICD-10-CM | POA: Diagnosis not present

## 2021-03-17 DIAGNOSIS — E1165 Type 2 diabetes mellitus with hyperglycemia: Secondary | ICD-10-CM | POA: Diagnosis not present

## 2021-03-17 DIAGNOSIS — M5134 Other intervertebral disc degeneration, thoracic region: Secondary | ICD-10-CM | POA: Diagnosis not present

## 2021-03-17 DIAGNOSIS — R519 Headache, unspecified: Secondary | ICD-10-CM | POA: Diagnosis not present

## 2021-03-17 DIAGNOSIS — Z7401 Bed confinement status: Secondary | ICD-10-CM | POA: Diagnosis not present

## 2021-03-17 DIAGNOSIS — I110 Hypertensive heart disease with heart failure: Secondary | ICD-10-CM | POA: Diagnosis not present

## 2021-03-17 DIAGNOSIS — R399 Unspecified symptoms and signs involving the genitourinary system: Secondary | ICD-10-CM | POA: Diagnosis not present

## 2021-03-17 DIAGNOSIS — H1045 Other chronic allergic conjunctivitis: Secondary | ICD-10-CM | POA: Diagnosis not present

## 2021-03-17 DIAGNOSIS — E871 Hypo-osmolality and hyponatremia: Secondary | ICD-10-CM | POA: Diagnosis not present

## 2021-03-17 DIAGNOSIS — N179 Acute kidney failure, unspecified: Secondary | ICD-10-CM | POA: Diagnosis not present

## 2021-03-17 DIAGNOSIS — N136 Pyonephrosis: Secondary | ICD-10-CM | POA: Diagnosis not present

## 2021-03-17 DIAGNOSIS — M47814 Spondylosis without myelopathy or radiculopathy, thoracic region: Secondary | ICD-10-CM | POA: Diagnosis not present

## 2021-03-17 DIAGNOSIS — M47816 Spondylosis without myelopathy or radiculopathy, lumbar region: Secondary | ICD-10-CM | POA: Diagnosis not present

## 2021-03-17 DIAGNOSIS — B3749 Other urogenital candidiasis: Secondary | ICD-10-CM | POA: Diagnosis not present

## 2021-03-17 DIAGNOSIS — H2512 Age-related nuclear cataract, left eye: Secondary | ICD-10-CM | POA: Diagnosis not present

## 2021-03-17 DIAGNOSIS — R590 Localized enlarged lymph nodes: Secondary | ICD-10-CM | POA: Diagnosis not present

## 2021-03-17 DIAGNOSIS — M5459 Other low back pain: Secondary | ICD-10-CM | POA: Diagnosis not present

## 2021-03-17 DIAGNOSIS — N134 Hydroureter: Secondary | ICD-10-CM | POA: Diagnosis not present

## 2021-03-17 DIAGNOSIS — R531 Weakness: Secondary | ICD-10-CM | POA: Diagnosis not present

## 2021-03-17 DIAGNOSIS — C61 Malignant neoplasm of prostate: Secondary | ICD-10-CM | POA: Diagnosis not present

## 2021-03-17 DIAGNOSIS — I509 Heart failure, unspecified: Secondary | ICD-10-CM | POA: Diagnosis not present

## 2021-03-17 DIAGNOSIS — E113292 Type 2 diabetes mellitus with mild nonproliferative diabetic retinopathy without macular edema, left eye: Secondary | ICD-10-CM | POA: Diagnosis not present

## 2021-03-17 DIAGNOSIS — N3090 Cystitis, unspecified without hematuria: Secondary | ICD-10-CM | POA: Diagnosis not present

## 2021-03-17 NOTE — Telephone Encounter (Signed)
I called the patient and spoke with his son and informed him that the provider  stated the patient can take 1000 mg of tylenol for his pain. He stated the patient was not sleeping, either.  I  did  tell him to try the tylenol pm  to help with the pain and sleep and  he is scheduled to see a provider here in the office on Tuesday.  Makayia Duplessis,cma

## 2021-03-17 NOTE — Telephone Encounter (Signed)
I called the patients son and informed him to not give the patient the tylenol PM due  to a fall risk and he understood.  Shimika Ames,cma

## 2021-03-17 NOTE — Telephone Encounter (Signed)
The patient needs to complete a visit with somebody in the office to determine what the appropriate treatment options would be.  He can take Tylenol 1000 mg every 8 hours.  He can be scheduled with me or any provider with availability to see him in the next few days.

## 2021-03-17 NOTE — Telephone Encounter (Signed)
See other encounter. The CMA spoke with the patients son and informed him not to give tylenol PM given fall risk with this medication. They will use plane tylenol.

## 2021-03-18 DIAGNOSIS — H02055 Trichiasis without entropian left lower eyelid: Secondary | ICD-10-CM | POA: Diagnosis not present

## 2021-03-18 DIAGNOSIS — H1045 Other chronic allergic conjunctivitis: Secondary | ICD-10-CM | POA: Diagnosis not present

## 2021-03-18 DIAGNOSIS — E113292 Type 2 diabetes mellitus with mild nonproliferative diabetic retinopathy without macular edema, left eye: Secondary | ICD-10-CM | POA: Diagnosis not present

## 2021-03-18 DIAGNOSIS — H2512 Age-related nuclear cataract, left eye: Secondary | ICD-10-CM | POA: Diagnosis not present

## 2021-03-18 DIAGNOSIS — E11319 Type 2 diabetes mellitus with unspecified diabetic retinopathy without macular edema: Secondary | ICD-10-CM | POA: Diagnosis not present

## 2021-03-19 ENCOUNTER — Telehealth: Payer: Self-pay | Admitting: Pharmacist

## 2021-03-19 ENCOUNTER — Telehealth: Payer: Medicare HMO

## 2021-03-19 NOTE — Telephone Encounter (Signed)
Per chart review, patient is currently admitted at [facility]. Will follow for disposition and reschedule CCM f/u

## 2021-03-21 DIAGNOSIS — N1330 Unspecified hydronephrosis: Secondary | ICD-10-CM | POA: Diagnosis not present

## 2021-03-21 DIAGNOSIS — C61 Malignant neoplasm of prostate: Secondary | ICD-10-CM | POA: Diagnosis not present

## 2021-03-23 ENCOUNTER — Ambulatory Visit: Payer: Medicare HMO | Admitting: Family

## 2021-03-25 ENCOUNTER — Telehealth: Payer: Self-pay

## 2021-03-25 DIAGNOSIS — R0902 Hypoxemia: Secondary | ICD-10-CM | POA: Diagnosis not present

## 2021-03-25 DIAGNOSIS — B3749 Other urogenital candidiasis: Secondary | ICD-10-CM | POA: Diagnosis present

## 2021-03-25 DIAGNOSIS — E512 Wernicke's encephalopathy: Secondary | ICD-10-CM | POA: Diagnosis present

## 2021-03-25 DIAGNOSIS — H05012 Cellulitis of left orbit: Secondary | ICD-10-CM | POA: Diagnosis not present

## 2021-03-25 DIAGNOSIS — H02409 Unspecified ptosis of unspecified eyelid: Secondary | ICD-10-CM | POA: Diagnosis not present

## 2021-03-25 DIAGNOSIS — H53122 Transient visual loss, left eye: Secondary | ICD-10-CM | POA: Diagnosis not present

## 2021-03-25 DIAGNOSIS — J9601 Acute respiratory failure with hypoxia: Secondary | ICD-10-CM | POA: Diagnosis not present

## 2021-03-25 DIAGNOSIS — I5032 Chronic diastolic (congestive) heart failure: Secondary | ICD-10-CM | POA: Diagnosis not present

## 2021-03-25 DIAGNOSIS — Z8546 Personal history of malignant neoplasm of prostate: Secondary | ICD-10-CM | POA: Diagnosis not present

## 2021-03-25 DIAGNOSIS — G9341 Metabolic encephalopathy: Secondary | ICD-10-CM | POA: Diagnosis not present

## 2021-03-25 DIAGNOSIS — J439 Emphysema, unspecified: Secondary | ICD-10-CM | POA: Diagnosis present

## 2021-03-25 DIAGNOSIS — R0609 Other forms of dyspnea: Secondary | ICD-10-CM | POA: Diagnosis not present

## 2021-03-25 DIAGNOSIS — Z7401 Bed confinement status: Secondary | ICD-10-CM | POA: Diagnosis not present

## 2021-03-25 DIAGNOSIS — I5033 Acute on chronic diastolic (congestive) heart failure: Secondary | ICD-10-CM | POA: Diagnosis not present

## 2021-03-25 DIAGNOSIS — R410 Disorientation, unspecified: Secondary | ICD-10-CM | POA: Diagnosis present

## 2021-03-25 DIAGNOSIS — N3000 Acute cystitis without hematuria: Secondary | ICD-10-CM | POA: Diagnosis not present

## 2021-03-25 DIAGNOSIS — E871 Hypo-osmolality and hyponatremia: Secondary | ICD-10-CM | POA: Diagnosis not present

## 2021-03-25 DIAGNOSIS — N390 Urinary tract infection, site not specified: Secondary | ICD-10-CM | POA: Diagnosis not present

## 2021-03-25 DIAGNOSIS — J90 Pleural effusion, not elsewhere classified: Secondary | ICD-10-CM | POA: Diagnosis not present

## 2021-03-25 DIAGNOSIS — H499 Unspecified paralytic strabismus: Secondary | ICD-10-CM | POA: Diagnosis not present

## 2021-03-25 DIAGNOSIS — G450 Vertebro-basilar artery syndrome: Secondary | ICD-10-CM | POA: Diagnosis present

## 2021-03-25 DIAGNOSIS — E1136 Type 2 diabetes mellitus with diabetic cataract: Secondary | ICD-10-CM | POA: Diagnosis present

## 2021-03-25 DIAGNOSIS — F05 Delirium due to known physiological condition: Secondary | ICD-10-CM | POA: Diagnosis present

## 2021-03-25 DIAGNOSIS — R531 Weakness: Secondary | ICD-10-CM | POA: Diagnosis not present

## 2021-03-25 DIAGNOSIS — Z66 Do not resuscitate: Secondary | ICD-10-CM | POA: Diagnosis not present

## 2021-03-25 DIAGNOSIS — E43 Unspecified severe protein-calorie malnutrition: Secondary | ICD-10-CM | POA: Diagnosis present

## 2021-03-25 DIAGNOSIS — M545 Low back pain, unspecified: Secondary | ICD-10-CM | POA: Diagnosis not present

## 2021-03-25 DIAGNOSIS — R4182 Altered mental status, unspecified: Secondary | ICD-10-CM | POA: Diagnosis not present

## 2021-03-25 DIAGNOSIS — J9811 Atelectasis: Secondary | ICD-10-CM | POA: Diagnosis not present

## 2021-03-25 DIAGNOSIS — C61 Malignant neoplasm of prostate: Secondary | ICD-10-CM | POA: Diagnosis not present

## 2021-03-25 DIAGNOSIS — J690 Pneumonitis due to inhalation of food and vomit: Secondary | ICD-10-CM | POA: Diagnosis present

## 2021-03-25 DIAGNOSIS — Z9189 Other specified personal risk factors, not elsewhere classified: Secondary | ICD-10-CM | POA: Diagnosis not present

## 2021-03-25 DIAGNOSIS — M5459 Other low back pain: Secondary | ICD-10-CM | POA: Diagnosis not present

## 2021-03-25 DIAGNOSIS — I110 Hypertensive heart disease with heart failure: Secondary | ICD-10-CM | POA: Diagnosis present

## 2021-03-25 DIAGNOSIS — I272 Pulmonary hypertension, unspecified: Secondary | ICD-10-CM | POA: Diagnosis present

## 2021-03-25 DIAGNOSIS — H4902 Third [oculomotor] nerve palsy, left eye: Secondary | ICD-10-CM | POA: Diagnosis not present

## 2021-03-25 DIAGNOSIS — J9602 Acute respiratory failure with hypercapnia: Secondary | ICD-10-CM | POA: Diagnosis not present

## 2021-03-25 DIAGNOSIS — I6782 Cerebral ischemia: Secondary | ICD-10-CM | POA: Diagnosis not present

## 2021-03-25 DIAGNOSIS — C8307 Small cell B-cell lymphoma, spleen: Secondary | ICD-10-CM | POA: Diagnosis not present

## 2021-03-25 DIAGNOSIS — N179 Acute kidney failure, unspecified: Secondary | ICD-10-CM | POA: Diagnosis not present

## 2021-03-25 DIAGNOSIS — G319 Degenerative disease of nervous system, unspecified: Secondary | ICD-10-CM | POA: Diagnosis not present

## 2021-03-25 DIAGNOSIS — L89153 Pressure ulcer of sacral region, stage 3: Secondary | ICD-10-CM | POA: Diagnosis not present

## 2021-03-25 DIAGNOSIS — D696 Thrombocytopenia, unspecified: Secondary | ICD-10-CM | POA: Diagnosis present

## 2021-03-25 DIAGNOSIS — D630 Anemia in neoplastic disease: Secondary | ICD-10-CM | POA: Diagnosis present

## 2021-03-25 DIAGNOSIS — I6381 Other cerebral infarction due to occlusion or stenosis of small artery: Secondary | ICD-10-CM | POA: Diagnosis not present

## 2021-03-25 DIAGNOSIS — R399 Unspecified symptoms and signs involving the genitourinary system: Secondary | ICD-10-CM | POA: Diagnosis not present

## 2021-03-25 DIAGNOSIS — R29818 Other symptoms and signs involving the nervous system: Secondary | ICD-10-CM | POA: Diagnosis not present

## 2021-03-25 DIAGNOSIS — Z7189 Other specified counseling: Secondary | ICD-10-CM | POA: Diagnosis not present

## 2021-03-25 DIAGNOSIS — I959 Hypotension, unspecified: Secondary | ICD-10-CM | POA: Diagnosis not present

## 2021-03-25 DIAGNOSIS — Z515 Encounter for palliative care: Secondary | ICD-10-CM | POA: Diagnosis not present

## 2021-03-25 DIAGNOSIS — C884 Extranodal marginal zone B-cell lymphoma of mucosa-associated lymphoid tissue [MALT-lymphoma]: Secondary | ICD-10-CM | POA: Diagnosis not present

## 2021-03-25 DIAGNOSIS — Z20822 Contact with and (suspected) exposure to covid-19: Secondary | ICD-10-CM | POA: Diagnosis not present

## 2021-03-25 DIAGNOSIS — G928 Other toxic encephalopathy: Secondary | ICD-10-CM | POA: Diagnosis not present

## 2021-03-25 DIAGNOSIS — H02402 Unspecified ptosis of left eyelid: Secondary | ICD-10-CM | POA: Diagnosis not present

## 2021-03-25 NOTE — Chronic Care Management (AMB) (Signed)
°  Care Management   Note  03/25/2021 Name: Matthew Miles. MRN: [id number] DOB: 1928-06-03  [name] is a 88 y.o. year old male who is a primary care patient of [name], MD and is actively engaged with the care management team. I reached out to [name] by phone today to assist with re-scheduling a follow up visit with the Pharmacist  Follow up plan: Unsuccessful telephone outreach attempt made. A HIPAA compliant phone message was left for the patient providing contact information and requesting a return call.  The care management team will reach out to the patient again over the next 5 days.  If patient returns call to provider office, please advise to call [facility]  [facility] [place], [place], [place], [place] [address] Direct Dial: [phone] [email or web address]

## 2021-03-26 ENCOUNTER — Inpatient Hospital Stay
Admission: EM | Admit: 2021-03-26 | Discharge: 2021-04-05 | DRG: 123 | Disposition: A | Discharge status: Hospice | Payer: Medicare HMO | Source: Skilled Nursing Facility | Attending: Internal Medicine | Admitting: Internal Medicine

## 2021-03-26 ENCOUNTER — Other Ambulatory Visit: Payer: Self-pay

## 2021-03-26 ENCOUNTER — Emergency Department: Payer: Medicare HMO

## 2021-03-26 ENCOUNTER — Encounter: Payer: Self-pay | Admitting: Emergency Medicine

## 2021-03-26 DIAGNOSIS — I110 Hypertensive heart disease with heart failure: Secondary | ICD-10-CM | POA: Diagnosis present

## 2021-03-26 DIAGNOSIS — I25118 Atherosclerotic heart disease of native coronary artery with other forms of angina pectoris: Secondary | ICD-10-CM | POA: Diagnosis present

## 2021-03-26 DIAGNOSIS — J9811 Atelectasis: Secondary | ICD-10-CM | POA: Diagnosis not present

## 2021-03-26 DIAGNOSIS — Z7189 Other specified counseling: Secondary | ICD-10-CM | POA: Diagnosis not present

## 2021-03-26 DIAGNOSIS — G450 Vertebro-basilar artery syndrome: Secondary | ICD-10-CM | POA: Diagnosis present

## 2021-03-26 DIAGNOSIS — H499 Unspecified paralytic strabismus: Secondary | ICD-10-CM | POA: Diagnosis present

## 2021-03-26 DIAGNOSIS — Z794 Long term (current) use of insulin: Secondary | ICD-10-CM

## 2021-03-26 DIAGNOSIS — J9601 Acute respiratory failure with hypoxia: Secondary | ICD-10-CM | POA: Diagnosis not present

## 2021-03-26 DIAGNOSIS — E43 Unspecified severe protein-calorie malnutrition: Secondary | ICD-10-CM | POA: Diagnosis present

## 2021-03-26 DIAGNOSIS — J189 Pneumonia, unspecified organism: Secondary | ICD-10-CM | POA: Diagnosis not present

## 2021-03-26 DIAGNOSIS — J449 Chronic obstructive pulmonary disease, unspecified: Secondary | ICD-10-CM | POA: Diagnosis present

## 2021-03-26 DIAGNOSIS — K219 Gastro-esophageal reflux disease without esophagitis: Secondary | ICD-10-CM | POA: Diagnosis present

## 2021-03-26 DIAGNOSIS — Z515 Encounter for palliative care: Secondary | ICD-10-CM | POA: Diagnosis not present

## 2021-03-26 DIAGNOSIS — D630 Anemia in neoplastic disease: Secondary | ICD-10-CM | POA: Diagnosis present

## 2021-03-26 DIAGNOSIS — L89153 Pressure ulcer of sacral region, stage 3: Secondary | ICD-10-CM | POA: Diagnosis not present

## 2021-03-26 DIAGNOSIS — H02402 Unspecified ptosis of left eyelid: Secondary | ICD-10-CM | POA: Diagnosis present

## 2021-03-26 DIAGNOSIS — I6782 Cerebral ischemia: Secondary | ICD-10-CM | POA: Diagnosis not present

## 2021-03-26 DIAGNOSIS — F05 Delirium due to known physiological condition: Secondary | ICD-10-CM | POA: Diagnosis present

## 2021-03-26 DIAGNOSIS — R54 Age-related physical debility: Secondary | ICD-10-CM | POA: Diagnosis present

## 2021-03-26 DIAGNOSIS — I959 Hypotension, unspecified: Secondary | ICD-10-CM | POA: Diagnosis not present

## 2021-03-26 DIAGNOSIS — I5033 Acute on chronic diastolic (congestive) heart failure: Secondary | ICD-10-CM | POA: Diagnosis not present

## 2021-03-26 DIAGNOSIS — L8990 Pressure ulcer of unspecified site, unspecified stage: Secondary | ICD-10-CM | POA: Diagnosis present

## 2021-03-26 DIAGNOSIS — E860 Dehydration: Secondary | ICD-10-CM | POA: Diagnosis present

## 2021-03-26 DIAGNOSIS — H5462 Unqualified visual loss, left eye, normal vision right eye: Secondary | ICD-10-CM | POA: Diagnosis present

## 2021-03-26 DIAGNOSIS — J690 Pneumonitis due to inhalation of food and vomit: Secondary | ICD-10-CM | POA: Diagnosis present

## 2021-03-26 DIAGNOSIS — H02409 Unspecified ptosis of unspecified eyelid: Secondary | ICD-10-CM | POA: Diagnosis not present

## 2021-03-26 DIAGNOSIS — Z8546 Personal history of malignant neoplasm of prostate: Secondary | ICD-10-CM

## 2021-03-26 DIAGNOSIS — Z825 Family history of asthma and other chronic lower respiratory diseases: Secondary | ICD-10-CM

## 2021-03-26 DIAGNOSIS — H05012 Cellulitis of left orbit: Secondary | ICD-10-CM | POA: Diagnosis not present

## 2021-03-26 DIAGNOSIS — B3749 Other urogenital candidiasis: Secondary | ICD-10-CM | POA: Diagnosis present

## 2021-03-26 DIAGNOSIS — Z66 Do not resuscitate: Secondary | ICD-10-CM | POA: Diagnosis not present

## 2021-03-26 DIAGNOSIS — Z800 Family history of malignant neoplasm of digestive organs: Secondary | ICD-10-CM

## 2021-03-26 DIAGNOSIS — Z79890 Hormone replacement therapy (postmenopausal): Secondary | ICD-10-CM

## 2021-03-26 DIAGNOSIS — R0600 Dyspnea, unspecified: Secondary | ICD-10-CM | POA: Diagnosis not present

## 2021-03-26 DIAGNOSIS — Z9049 Acquired absence of other specified parts of digestive tract: Secondary | ICD-10-CM

## 2021-03-26 DIAGNOSIS — R0902 Hypoxemia: Secondary | ICD-10-CM | POA: Diagnosis not present

## 2021-03-26 DIAGNOSIS — H53132 Sudden visual loss, left eye: Secondary | ICD-10-CM

## 2021-03-26 DIAGNOSIS — E878 Other disorders of electrolyte and fluid balance, not elsewhere classified: Secondary | ICD-10-CM | POA: Diagnosis present

## 2021-03-26 DIAGNOSIS — E861 Hypovolemia: Secondary | ICD-10-CM | POA: Diagnosis present

## 2021-03-26 DIAGNOSIS — J439 Emphysema, unspecified: Secondary | ICD-10-CM | POA: Diagnosis present

## 2021-03-26 DIAGNOSIS — R4182 Altered mental status, unspecified: Secondary | ICD-10-CM

## 2021-03-26 DIAGNOSIS — E1136 Type 2 diabetes mellitus with diabetic cataract: Secondary | ICD-10-CM | POA: Diagnosis present

## 2021-03-26 DIAGNOSIS — C8307 Small cell B-cell lymphoma, spleen: Secondary | ICD-10-CM | POA: Diagnosis present

## 2021-03-26 DIAGNOSIS — R270 Ataxia, unspecified: Secondary | ICD-10-CM | POA: Diagnosis present

## 2021-03-26 DIAGNOSIS — H5122 Internuclear ophthalmoplegia, left eye: Secondary | ICD-10-CM | POA: Diagnosis present

## 2021-03-26 DIAGNOSIS — N390 Urinary tract infection, site not specified: Secondary | ICD-10-CM | POA: Diagnosis not present

## 2021-03-26 DIAGNOSIS — I272 Pulmonary hypertension, unspecified: Secondary | ICD-10-CM | POA: Diagnosis present

## 2021-03-26 DIAGNOSIS — N400 Enlarged prostate without lower urinary tract symptoms: Secondary | ICD-10-CM | POA: Diagnosis present

## 2021-03-26 DIAGNOSIS — R627 Adult failure to thrive: Secondary | ICD-10-CM | POA: Diagnosis present

## 2021-03-26 DIAGNOSIS — E512 Wernicke's encephalopathy: Secondary | ICD-10-CM | POA: Diagnosis not present

## 2021-03-26 DIAGNOSIS — E8809 Other disorders of plasma-protein metabolism, not elsewhere classified: Secondary | ICD-10-CM | POA: Diagnosis present

## 2021-03-26 DIAGNOSIS — D696 Thrombocytopenia, unspecified: Secondary | ICD-10-CM | POA: Diagnosis present

## 2021-03-26 DIAGNOSIS — Z881 Allergy status to other antibiotic agents status: Secondary | ICD-10-CM

## 2021-03-26 DIAGNOSIS — I5032 Chronic diastolic (congestive) heart failure: Secondary | ICD-10-CM | POA: Diagnosis not present

## 2021-03-26 DIAGNOSIS — M545 Low back pain, unspecified: Secondary | ICD-10-CM | POA: Diagnosis not present

## 2021-03-26 DIAGNOSIS — G928 Other toxic encephalopathy: Secondary | ICD-10-CM | POA: Diagnosis not present

## 2021-03-26 DIAGNOSIS — R0609 Other forms of dyspnea: Secondary | ICD-10-CM | POA: Diagnosis not present

## 2021-03-26 DIAGNOSIS — Z20822 Contact with and (suspected) exposure to covid-19: Secondary | ICD-10-CM | POA: Diagnosis not present

## 2021-03-26 DIAGNOSIS — J9602 Acute respiratory failure with hypercapnia: Secondary | ICD-10-CM | POA: Diagnosis not present

## 2021-03-26 DIAGNOSIS — Z7984 Long term (current) use of oral hypoglycemic drugs: Secondary | ICD-10-CM

## 2021-03-26 DIAGNOSIS — C61 Malignant neoplasm of prostate: Secondary | ICD-10-CM | POA: Diagnosis present

## 2021-03-26 DIAGNOSIS — Z923 Personal history of irradiation: Secondary | ICD-10-CM

## 2021-03-26 DIAGNOSIS — I651 Occlusion and stenosis of basilar artery: Secondary | ICD-10-CM | POA: Diagnosis present

## 2021-03-26 DIAGNOSIS — E876 Hypokalemia: Secondary | ICD-10-CM | POA: Diagnosis not present

## 2021-03-26 DIAGNOSIS — G9341 Metabolic encephalopathy: Secondary | ICD-10-CM | POA: Diagnosis not present

## 2021-03-26 DIAGNOSIS — E871 Hypo-osmolality and hyponatremia: Secondary | ICD-10-CM | POA: Diagnosis not present

## 2021-03-26 DIAGNOSIS — Z86010 Personal history of colonic polyps: Secondary | ICD-10-CM

## 2021-03-26 DIAGNOSIS — J90 Pleural effusion, not elsewhere classified: Secondary | ICD-10-CM | POA: Diagnosis not present

## 2021-03-26 DIAGNOSIS — Z6829 Body mass index (BMI) 29.0-29.9, adult: Secondary | ICD-10-CM

## 2021-03-26 DIAGNOSIS — R441 Visual hallucinations: Secondary | ICD-10-CM | POA: Diagnosis present

## 2021-03-26 DIAGNOSIS — R410 Disorientation, unspecified: Secondary | ICD-10-CM | POA: Diagnosis not present

## 2021-03-26 DIAGNOSIS — E7800 Pure hypercholesterolemia, unspecified: Secondary | ICD-10-CM | POA: Diagnosis present

## 2021-03-26 DIAGNOSIS — Z79899 Other long term (current) drug therapy: Secondary | ICD-10-CM

## 2021-03-26 DIAGNOSIS — I6381 Other cerebral infarction due to occlusion or stenosis of small artery: Secondary | ICD-10-CM | POA: Diagnosis not present

## 2021-03-26 DIAGNOSIS — Z888 Allergy status to other drugs, medicaments and biological substances status: Secondary | ICD-10-CM

## 2021-03-26 DIAGNOSIS — E119 Type 2 diabetes mellitus without complications: Secondary | ICD-10-CM

## 2021-03-26 DIAGNOSIS — Z8042 Family history of malignant neoplasm of prostate: Secondary | ICD-10-CM

## 2021-03-26 DIAGNOSIS — Z7982 Long term (current) use of aspirin: Secondary | ICD-10-CM

## 2021-03-26 DIAGNOSIS — H4902 Third [oculomotor] nerve palsy, left eye: Secondary | ICD-10-CM | POA: Diagnosis not present

## 2021-03-26 DIAGNOSIS — Z87891 Personal history of nicotine dependence: Secondary | ICD-10-CM

## 2021-03-26 DIAGNOSIS — N179 Acute kidney failure, unspecified: Secondary | ICD-10-CM | POA: Diagnosis not present

## 2021-03-26 DIAGNOSIS — I10 Essential (primary) hypertension: Secondary | ICD-10-CM | POA: Diagnosis present

## 2021-03-26 DIAGNOSIS — E785 Hyperlipidemia, unspecified: Secondary | ICD-10-CM | POA: Diagnosis present

## 2021-03-26 DIAGNOSIS — R29818 Other symptoms and signs involving the nervous system: Secondary | ICD-10-CM | POA: Diagnosis not present

## 2021-03-26 DIAGNOSIS — G319 Degenerative disease of nervous system, unspecified: Secondary | ICD-10-CM | POA: Diagnosis not present

## 2021-03-26 DIAGNOSIS — Z7985 Long-term (current) use of injectable non-insulin antidiabetic drugs: Secondary | ICD-10-CM

## 2021-03-26 DIAGNOSIS — H53122 Transient visual loss, left eye: Secondary | ICD-10-CM | POA: Diagnosis not present

## 2021-03-26 LAB — CBC WITH DIFFERENTIAL/PLATELET
Abs Immature Granulocytes: 0.09 10*3/uL — ABNORMAL HIGH (ref 0.00–0.07)
Basophils Absolute: 0 10*3/uL (ref 0.0–0.1)
Basophils Relative: 0 %
Eosinophils Absolute: 0.1 10*3/uL (ref 0.0–0.5)
Eosinophils Relative: 1 %
HCT: 31.1 % — ABNORMAL LOW (ref 39.0–52.0)
Hemoglobin: 10.4 g/dL — ABNORMAL LOW (ref 13.0–17.0)
Immature Granulocytes: 1 %
Lymphocytes Relative: 8 %
Lymphs Abs: 0.8 10*3/uL (ref 0.7–4.0)
MCH: 31.9 pg (ref 26.0–34.0)
MCHC: 33.4 g/dL (ref 30.0–36.0)
MCV: 95.4 fL (ref 80.0–100.0)
Monocytes Absolute: 1 10*3/uL (ref 0.1–1.0)
Monocytes Relative: 9 %
Neutro Abs: 8.7 10*3/uL — ABNORMAL HIGH (ref 1.7–7.7)
Neutrophils Relative %: 81 %
Platelets: 161 10*3/uL (ref 150–400)
RBC: 3.26 MIL/uL — ABNORMAL LOW (ref 4.22–5.81)
RDW: 14 % (ref 11.5–15.5)
WBC: 10.7 10*3/uL — ABNORMAL HIGH (ref 4.0–10.5)
nRBC: 0 % (ref 0.0–0.2)

## 2021-03-26 LAB — LACTIC ACID, PLASMA: Lactic Acid, Venous: 1.4 mmol/L (ref 0.5–1.9)

## 2021-03-26 LAB — BASIC METABOLIC PANEL
Anion gap: 7 (ref 5–15)
BUN: 25 mg/dL — ABNORMAL HIGH (ref 8–23)
CO2: 27 mmol/L (ref 22–32)
Calcium: 8.3 mg/dL — ABNORMAL LOW (ref 8.9–10.3)
Chloride: 93 mmol/L — ABNORMAL LOW (ref 98–111)
Creatinine, Ser: 1 mg/dL (ref 0.61–1.24)
GFR, Estimated: >60 mL/min (ref >60)
Glucose, Bld: 184 mg/dL — ABNORMAL HIGH (ref 70–99)
Potassium: 4.1 mmol/L (ref 3.5–5.1)
Sodium: 127 mmol/L — ABNORMAL LOW (ref 135–145)

## 2021-03-26 IMAGING — MR MR HEAD W/O CM
12 series · 48 of 48 positions shown · non-contrast
Comparison: No prior MRI, correlation is made with CT head
[DATE]

CLINICAL DATA: Neuro deficit, stroke suspected, loss of vision in
left eye

EXAM:
MRI HEAD WITHOUT CONTRAST
TECHNIQUE: Multiplanar, multiecho pulse sequences of the brain and surrounding
structures were obtained without intravenous contrast.

[Series 5: ax dwi_tracew · axial · 3.0mm · 0.65mm/px · z∈[-119,+23]mm · 3 of 44 slices shown]
[im 1/44]
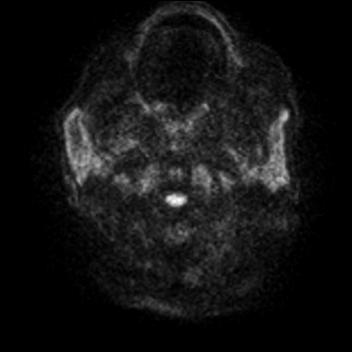
[im 22/44]
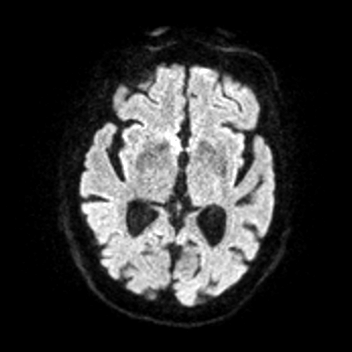
[im 44/44]
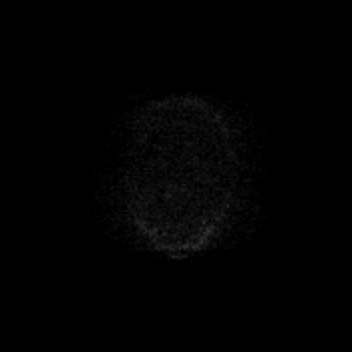

[Series 6: ax dwi_adc · axial · 3.0mm · 0.65mm/px · z∈[-119,+23]mm · 3 of 44 slices shown]
[im 1/44]
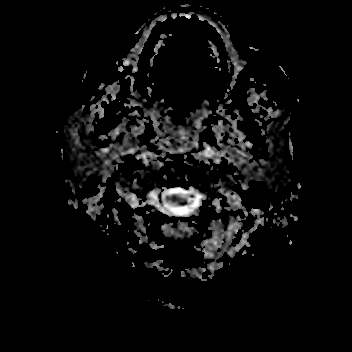
[im 22/44]
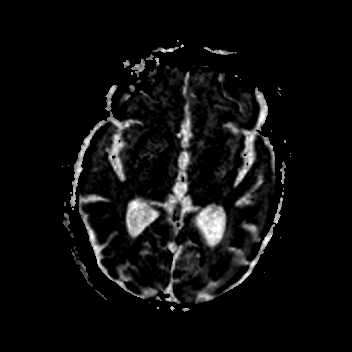
[im 44/44]
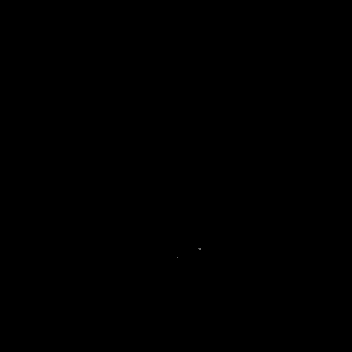

[Series 7: cor dwi_tracew · coronal · 5.0mm · 0.60mm/px · 3 of 34 slices shown]
[im 1/34]
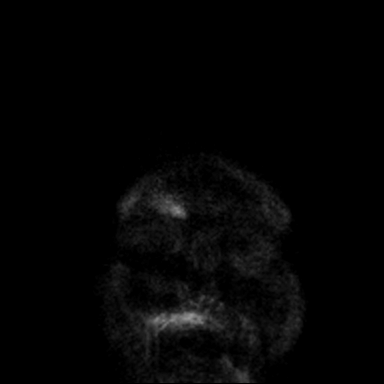
[im 17/34]
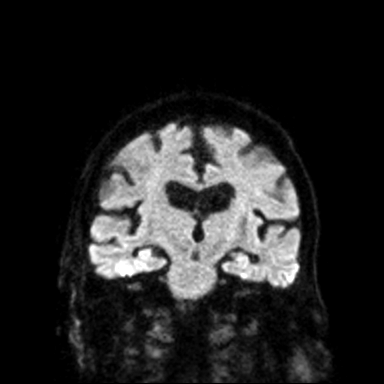
[im 34/34]
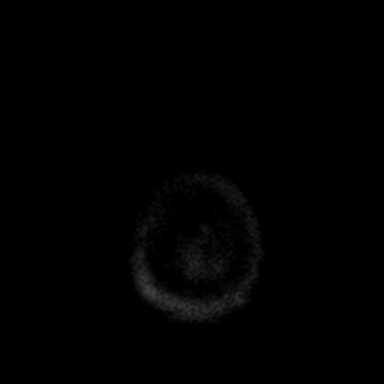

[Series 8: cor dwi_adc · coronal · 5.0mm · 0.60mm/px · 3 of 34 slices shown]
[im 1/34]
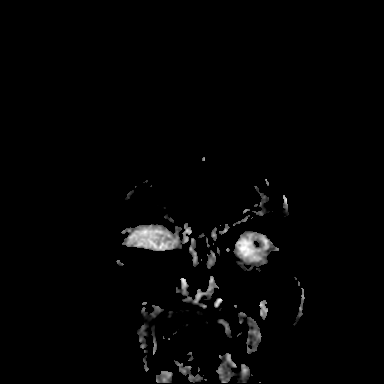
[im 17/34]
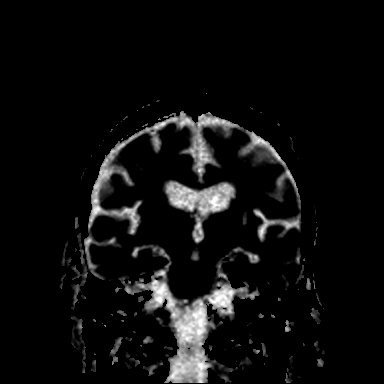
[im 34/34]
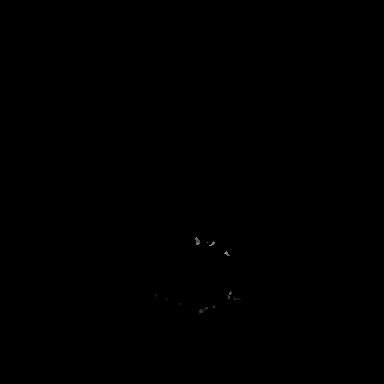

[Series 9: T1 · sagittal · 5.0mm · 0.62mm/px · 2 of 20 slices shown (1 of 2)]
[im 1/20]
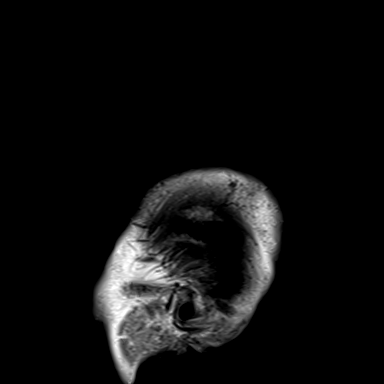
[im 20/20]
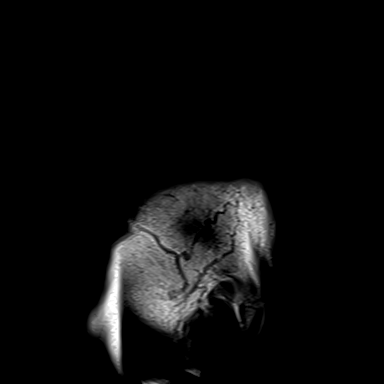

[Series 10: T2 · axial · 5.0mm · 0.53mm/px · z∈[-114,+23]mm · 2 of 24 slices shown (1 of 2)]
[im 1/24]
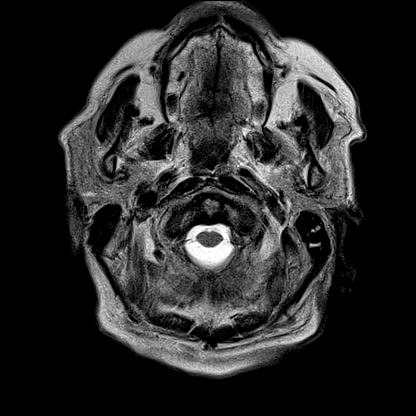
[im 24/24]
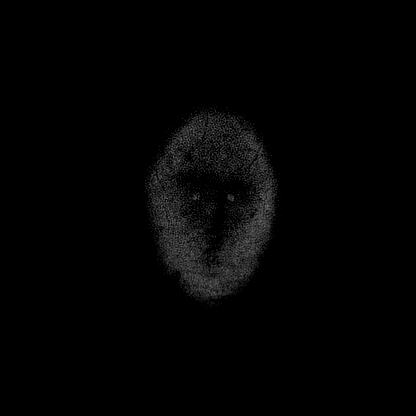

[Series 11: mag_images · axial · 3.0mm · 0.90mm/px · z∈[-133,+43]mm · 5 of 60 slices shown]
[im 1/60]
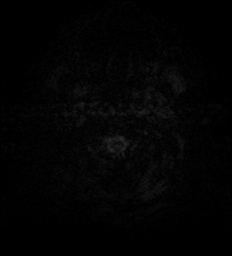
[im 15/60]
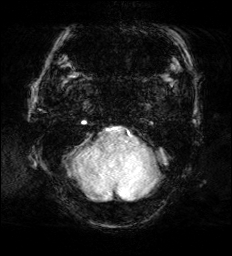
[im 30/60]
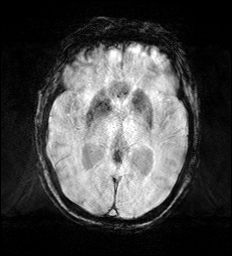
[im 45/60]
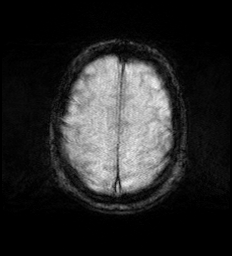
[im 60/60]
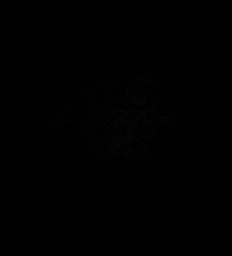

[Series 12: pha_images · axial · 3.0mm · 0.90mm/px · z∈[-130,+40]mm · 5 of 57 slices shown]
[im 1/57]
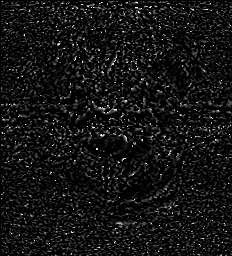
[im 15/57]
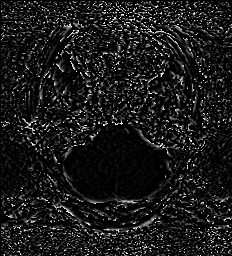
[im 29/57]
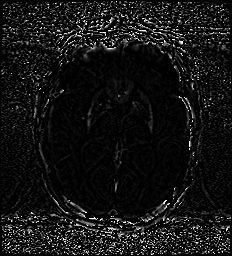
[im 43/57]
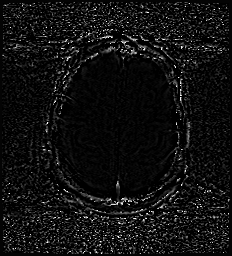
[im 57/57]
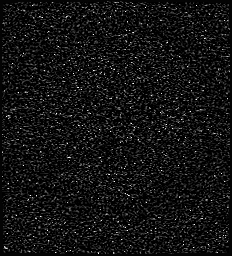

[Series 13: swi_images · axial · 3.0mm · 0.90mm/px · z∈[-133,+43]mm · 5 of 60 slices shown]
[im 1/60]
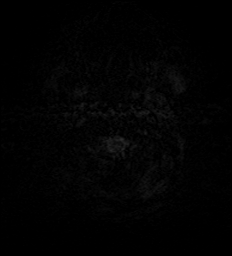
[im 15/60]
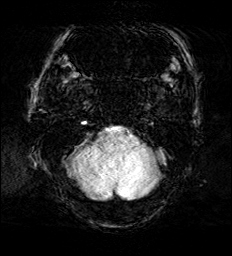
[im 30/60]
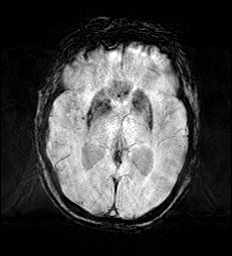
[im 45/60]
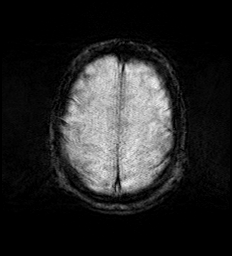
[im 60/60]
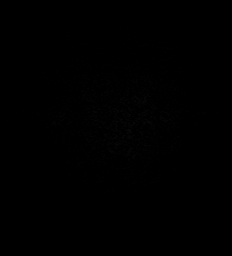

[Series 15: FLAIR · axial · 3.0mm · 0.53mm/px · z∈[-126,+35]mm · 4 of 55 slices shown]
[im 1/55]
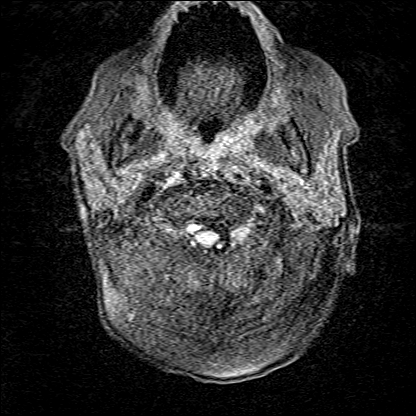
[im 19/55]
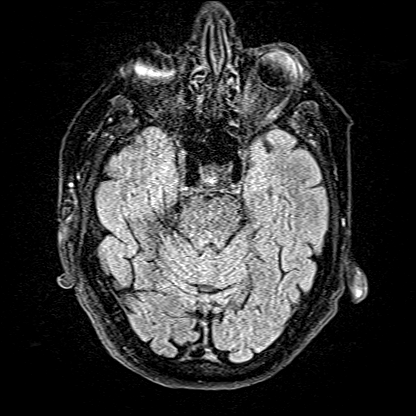
[im 37/55]
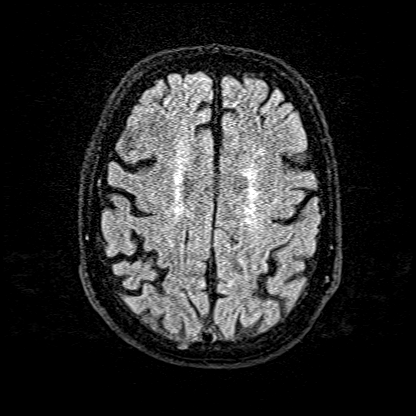
[im 55/55]
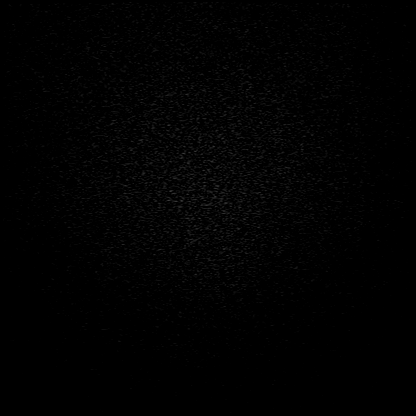

[Series 16: T1 · axial · 1.0mm · 0.98mm/px · z∈[-116,+27]mm · 11 of 144 slices shown (2 of 2)]
[im 1/144]
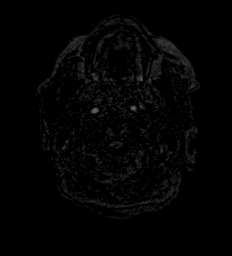
[im 15/144]
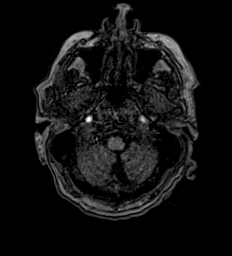
[im 29/144]
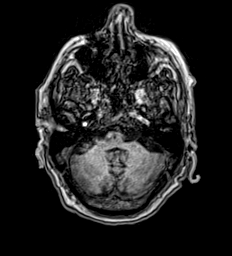
[im 43/144]
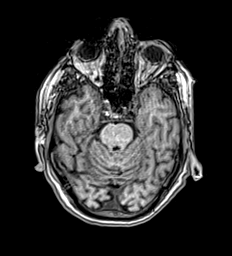
[im 58/144]
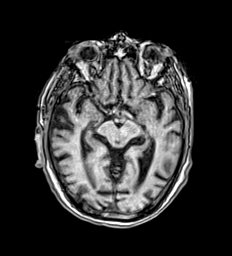
[im 72/144]
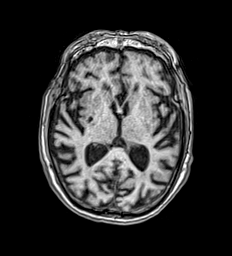
[im 86/144]
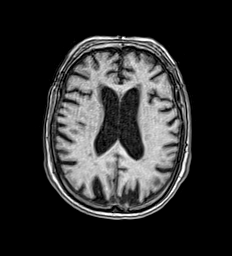
[im 101/144]
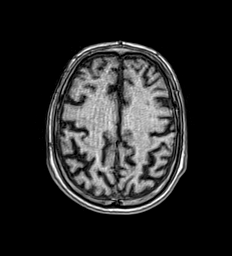
[im 115/144]
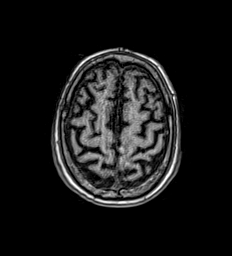
[im 129/144]
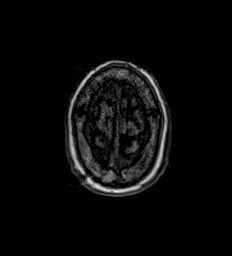
[im 144/144]
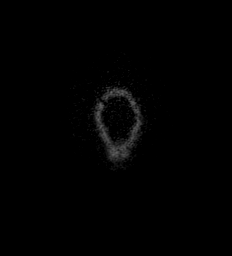

[Series 17: T2 · coronal · 5.0mm · 0.57mm/px · 2 of 26 slices shown (2 of 2)]
[im 1/26]
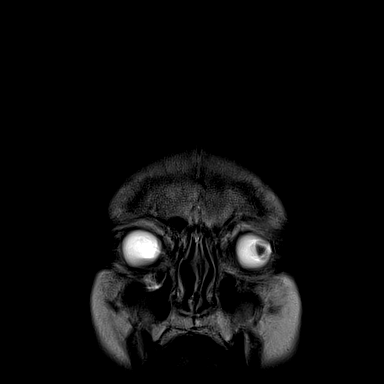
[im 26/26]
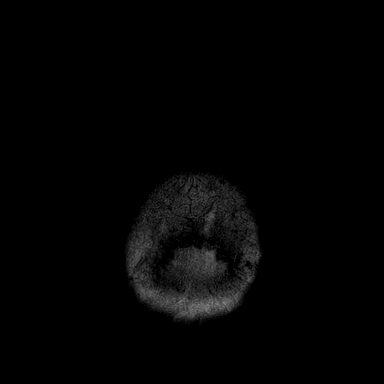

[48 of 48 positions shown; findings below may reference images not displayed]

FINDINGS: Brain: No restricted diffusion to suggest acute or subacute infarct.
No acute hemorrhage, mass, mass effect, or midline shift. Lacunar
infarcts in the right basal ganglia. T2 hyperintense signal in the
periventricular white matter, likely the sequela of chronic small
vessel ischemic disease. No hydrocephalus or extra-axial collection.
Focus of hemosiderin deposition in the right medial temporal lobe,
likely sequela of prior hypertensive microhemorrhage.

Vascular: Normal flow voids.

Skull and upper cervical spine: Normal marrow signal.

Sinuses/Orbits: Negative.  Status post right lens replacement.

Other: Fluid in the bilateral mastoid air cells.
IMPRESSION: No acute intracranial process.

## 2021-03-26 IMAGING — CT CT HEAD W/O CM
4 series · 17 of 47 positions shown, 19 images · non-contrast
Comparison: [DATE]

CLINICAL DATA: Left eye droop

EXAM:
CT HEAD WITHOUT CONTRAST
TECHNIQUE: Contiguous axial images were obtained from the base of the skull
through the vertex without intravenous contrast.

[Series 2: head wo · axial · 0.43mm/px · z∈[-203,-83]mm · 7 of 33 slices shown, 9 images]
[im 5/33  brain]
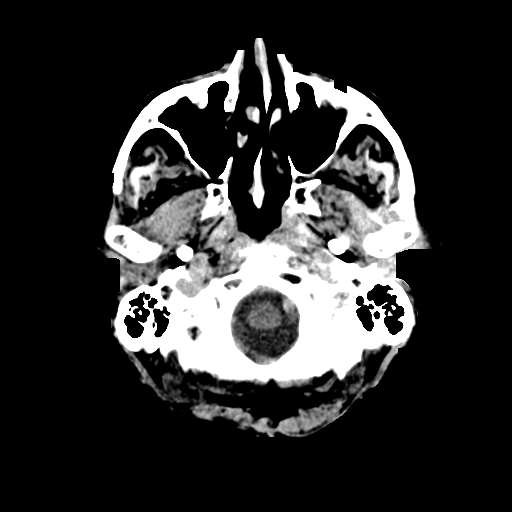
[im 5/33  bone]
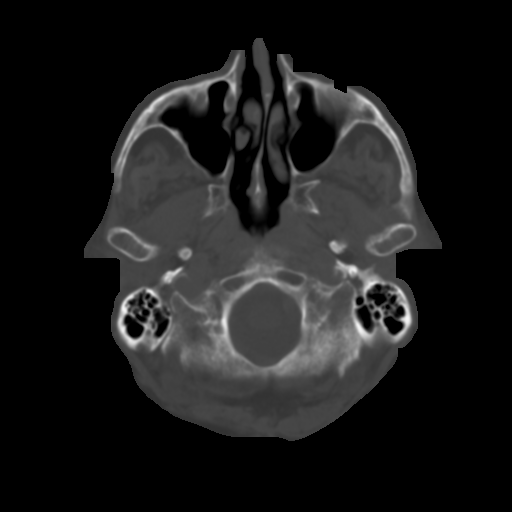
[im 9/33  brain]
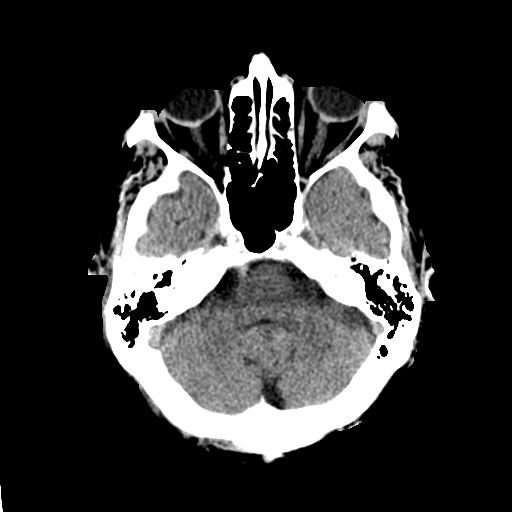
[im 13/33  brain]
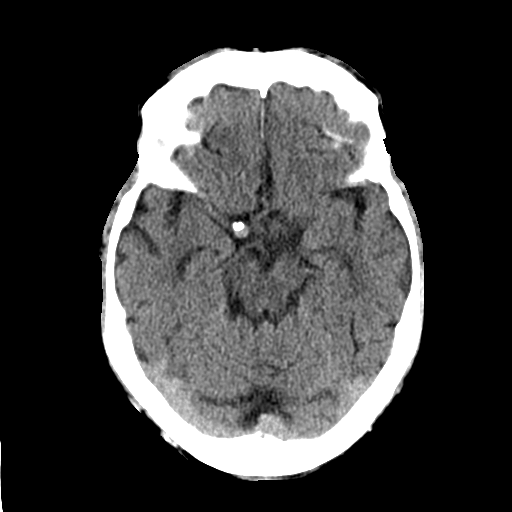
[im 17/33  brain]
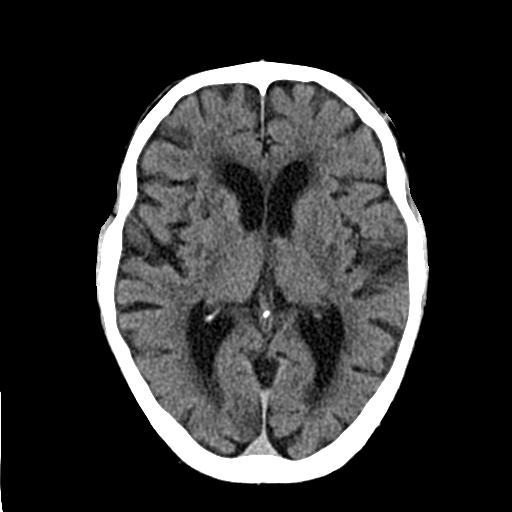
[im 21/33  brain]
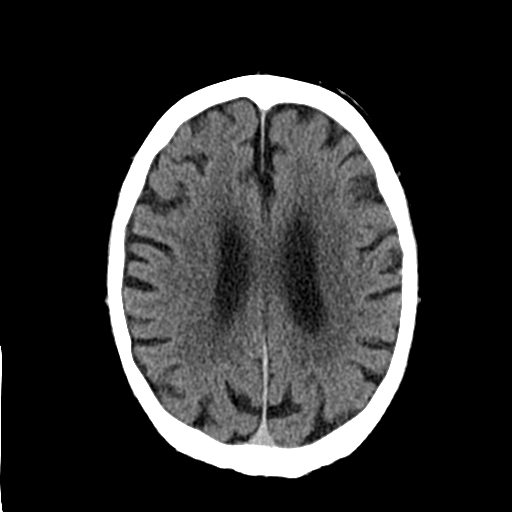
[im 21/33  bone]
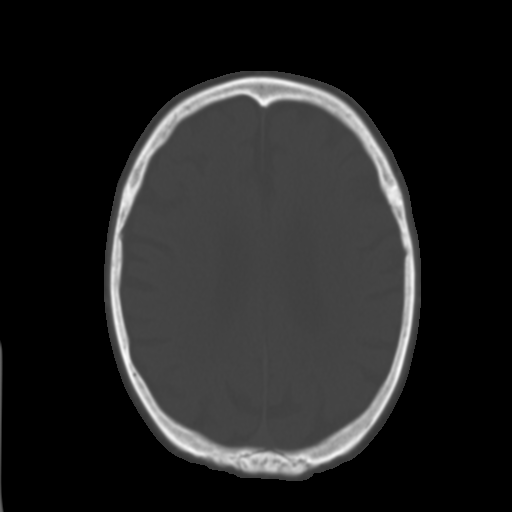
[im 25/33  brain]
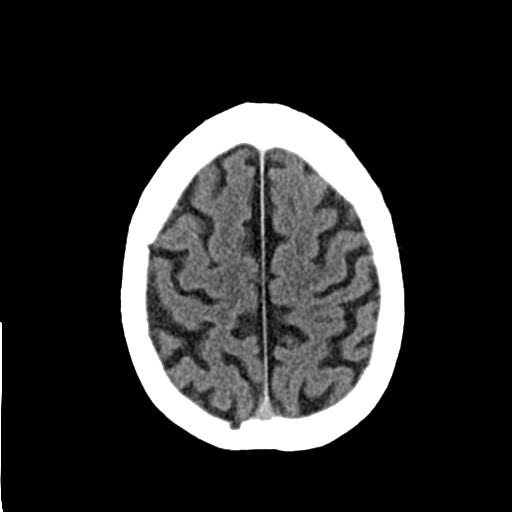
[im 29/33  brain]
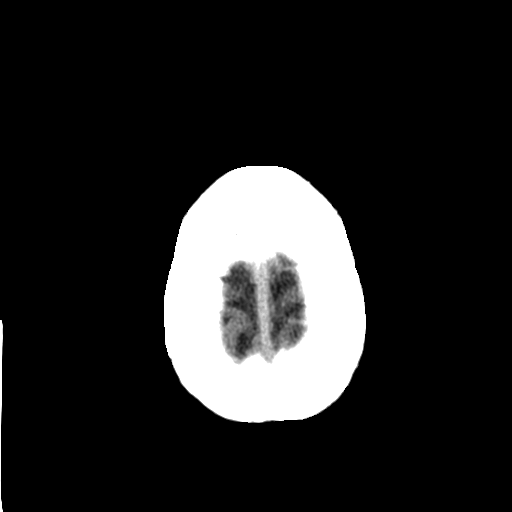

[Series 3: head bone · axial · 0.43mm/px · z∈[-207,-151]mm · 4 of 81 slices shown]
[im 9/81  bone]
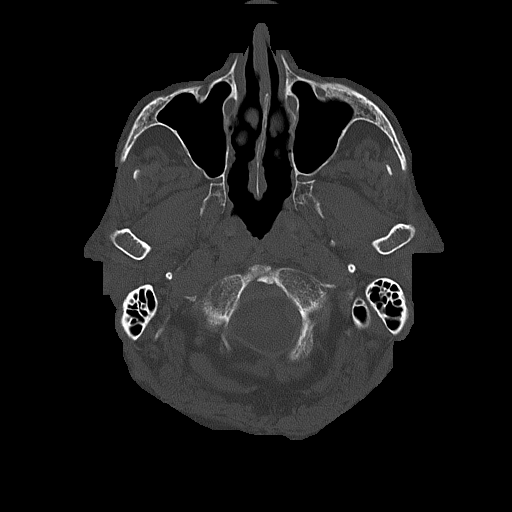
[im 17/81  bone]
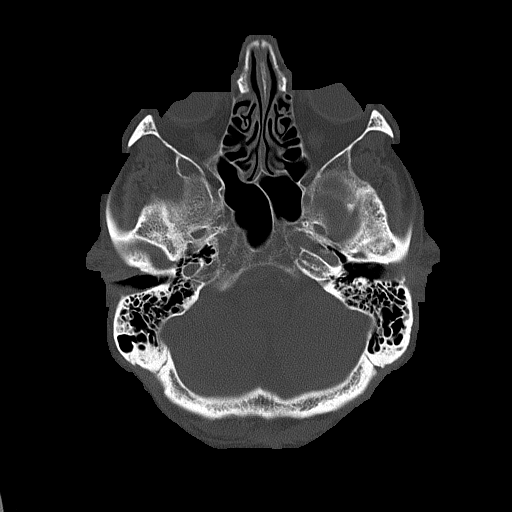
[im 25/81  bone]
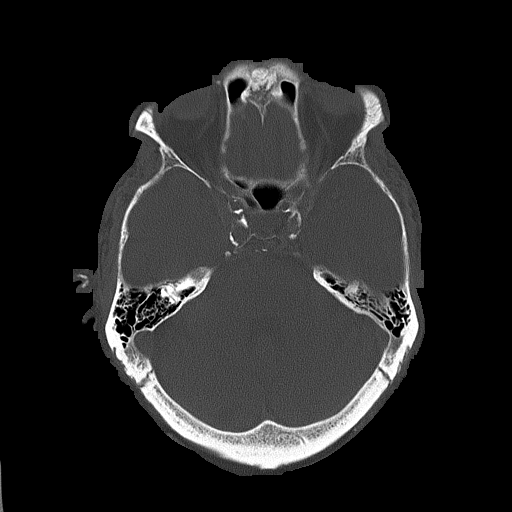
[im 37/81  bone]
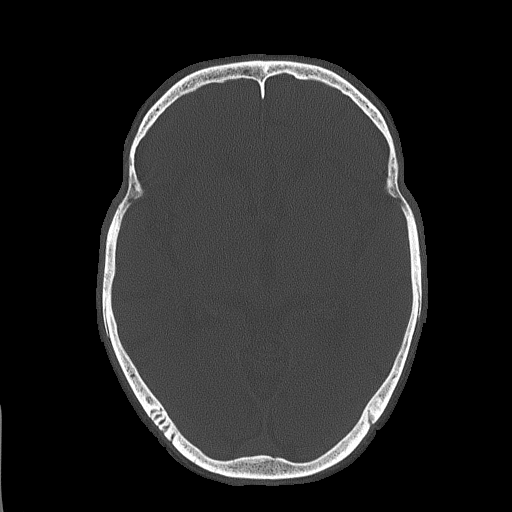

[Series 4: coronal soft tissue · coronal · 0.35mm/px · 3 of 73 slices shown]
[im 25/73  brain]
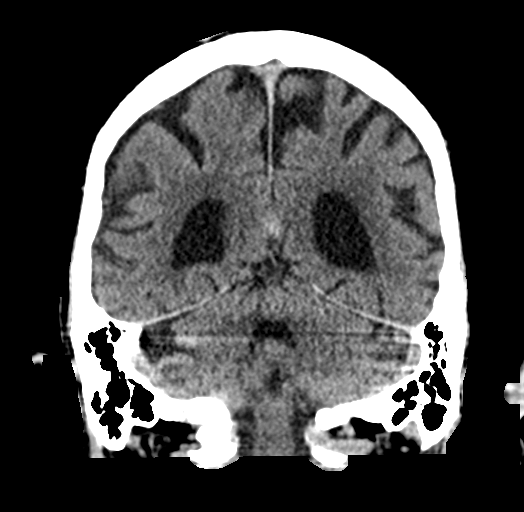
[im 33/73  brain]
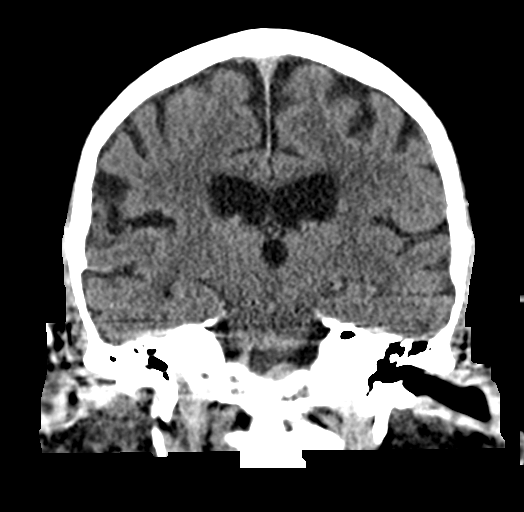
[im 41/73  brain]
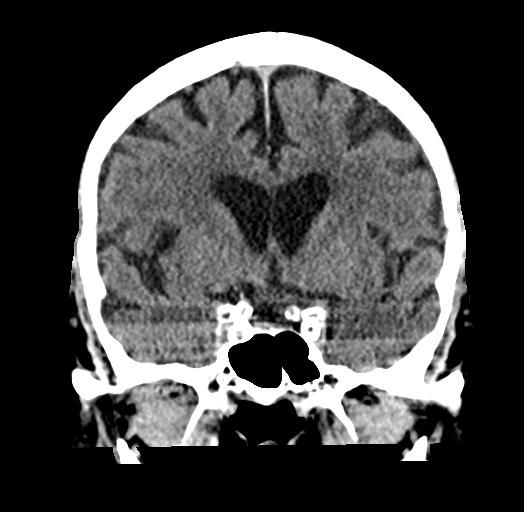

[Series 5: sagittal soft tissue · sagittal · 0.36mm/px · 3 of 55 slices shown]
[im 19/55  brain]
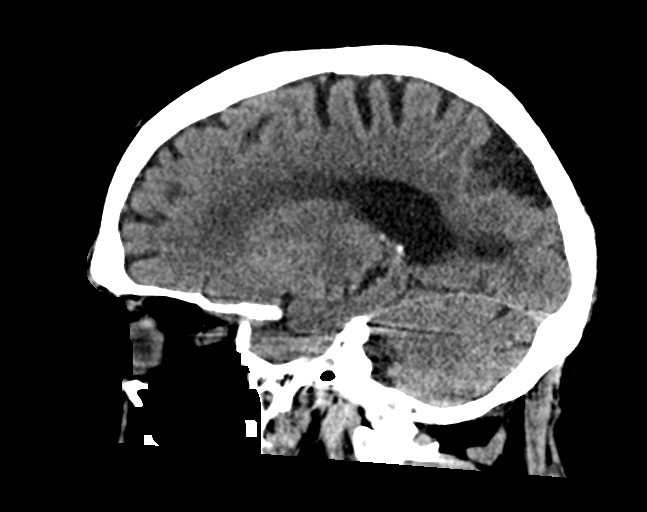
[im 28/55  brain]
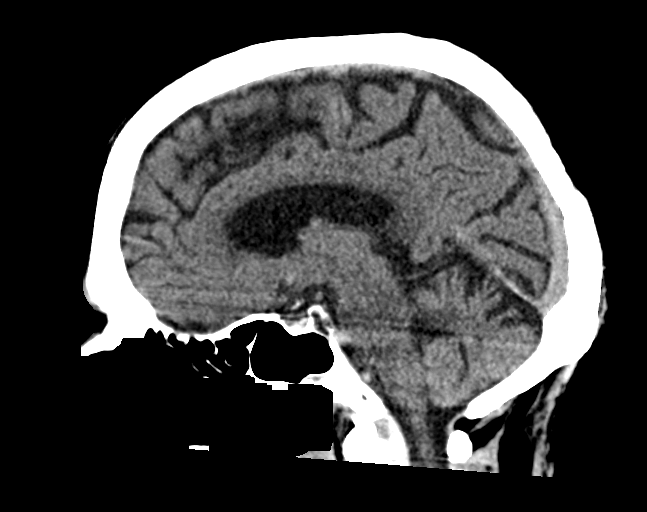
[im 37/55  brain]
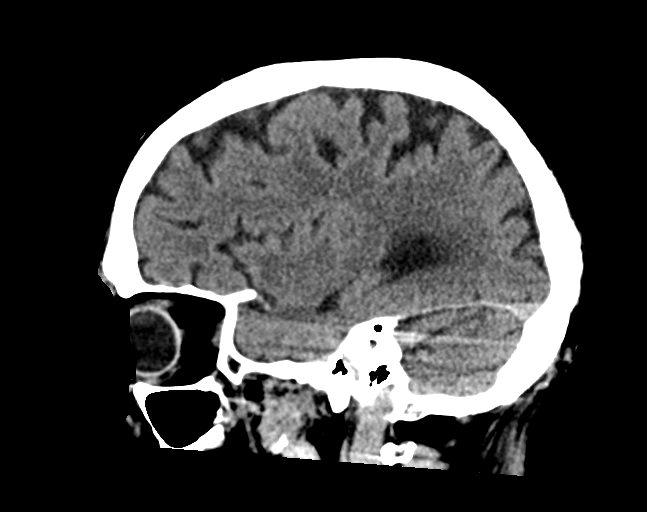

[17 of 47 positions shown; findings below may reference images not displayed]

FINDINGS: Brain: Mild atrophic changes and chronic white matter ischemic
changes are identified. A few small lacunar infarcts are noted
within the basal ganglia on the right stable in appearance from the
prior exam. No acute hemorrhage or acute infarction is seen.

Vascular: No hyperdense vessel or unexpected calcification.

Skull: Normal. Negative for fracture or focal lesion.

Sinuses/Orbits: No acute finding.

Other: None.
IMPRESSION: Chronic atrophic and ischemic changes without acute abnormality.

## 2021-03-26 IMAGING — CT CT ORBITS W/ CM
3 of 6 series · 12 of 47 positions shown, 14 images · IV contrast (omnipaque)
Comparison: No prior CT orbits, correlation is made with [DATE]
CT head

CLINICAL DATA: Orbital cellulitis suspected, left eyelid droop

EXAM:
CT ORBITS WITH CONTRAST
TECHNIQUE: Multidetector CT images was performed according to the standard
protocol following intravenous contrast administration.
CONTRAST:  75mL OMNIPAQUE IOHEXOL 300 MG/ML  SOLN

[Series 2: orbits 2.0 hr60 bone · axial · 0.34mm/px · z∈[-233,-161]mm · 7 of 44 slices shown, 9 images]
[im 4/44  brain]
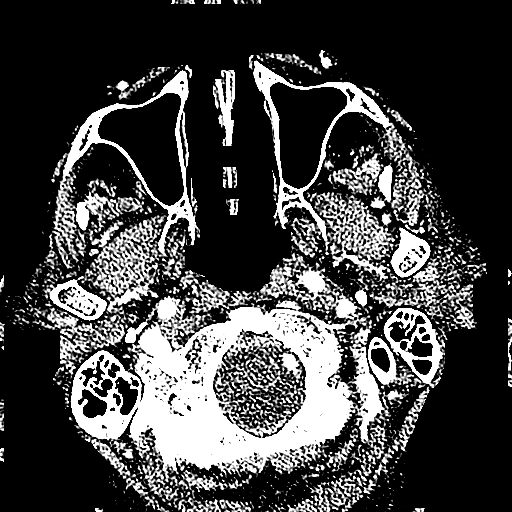
[im 4/44  bone]
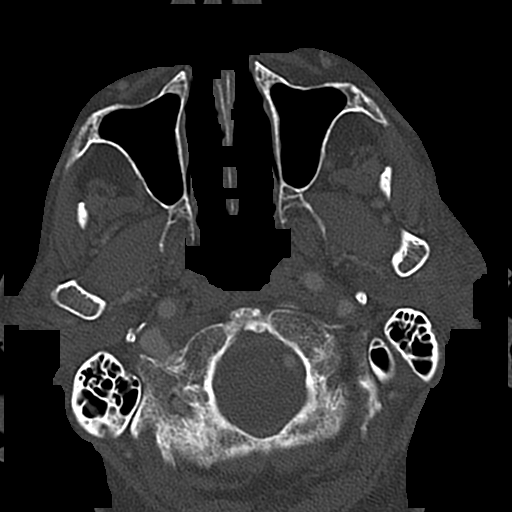
[im 10/44  bone]
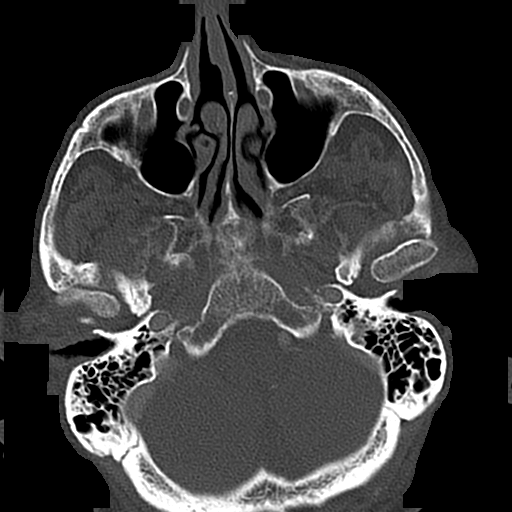
[im 16/44  bone]
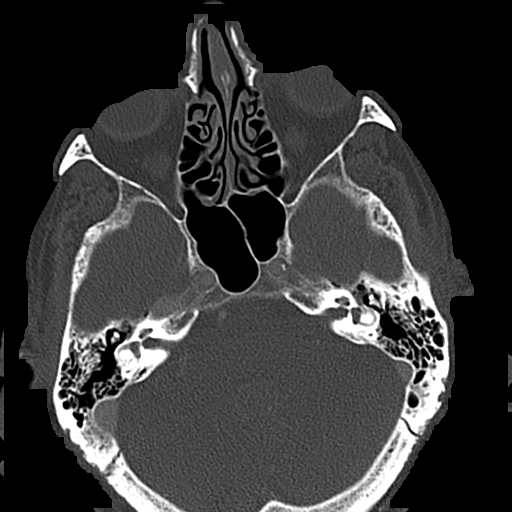
[im 22/44  bone]
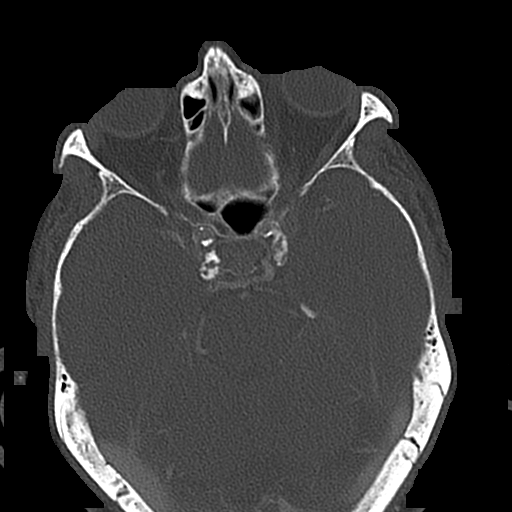
[im 28/44  brain]
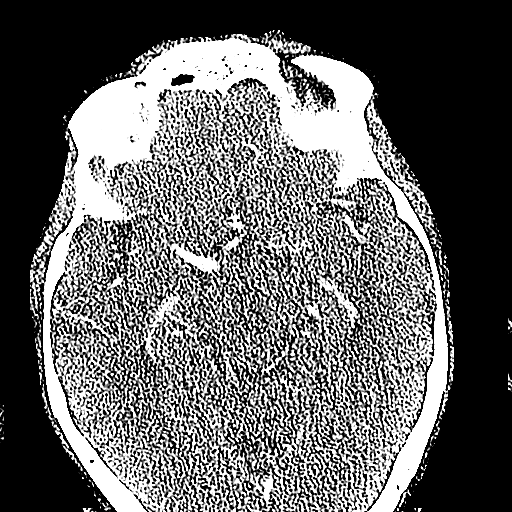
[im 28/44  bone]
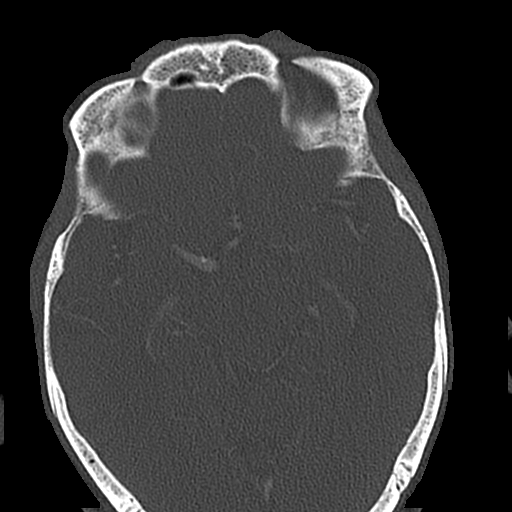
[im 34/44  bone]
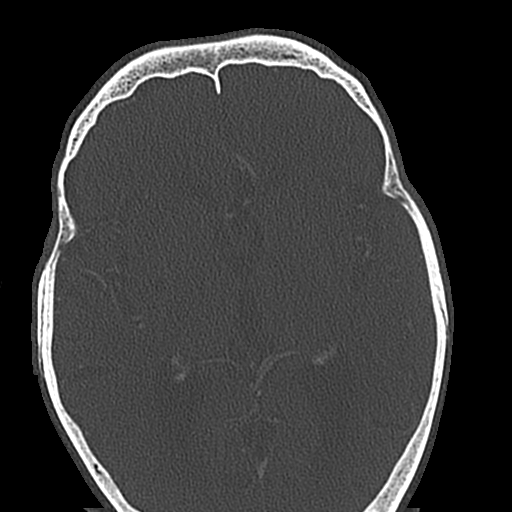
[im 40/44  bone]
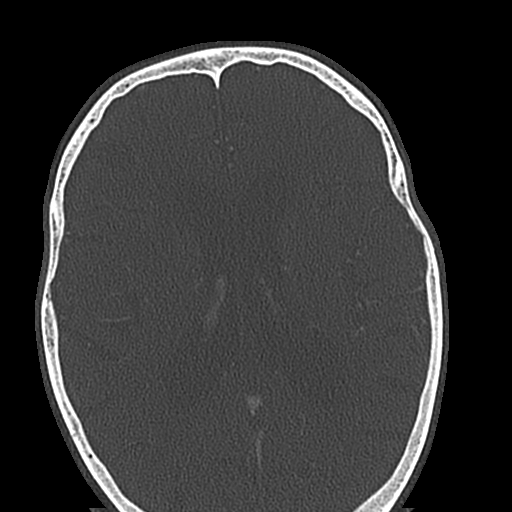

[Series 4: orbits 2.0 coronal · coronal · 0.16mm/px · 3 of 110 slices shown]
[im 28/110  bone]
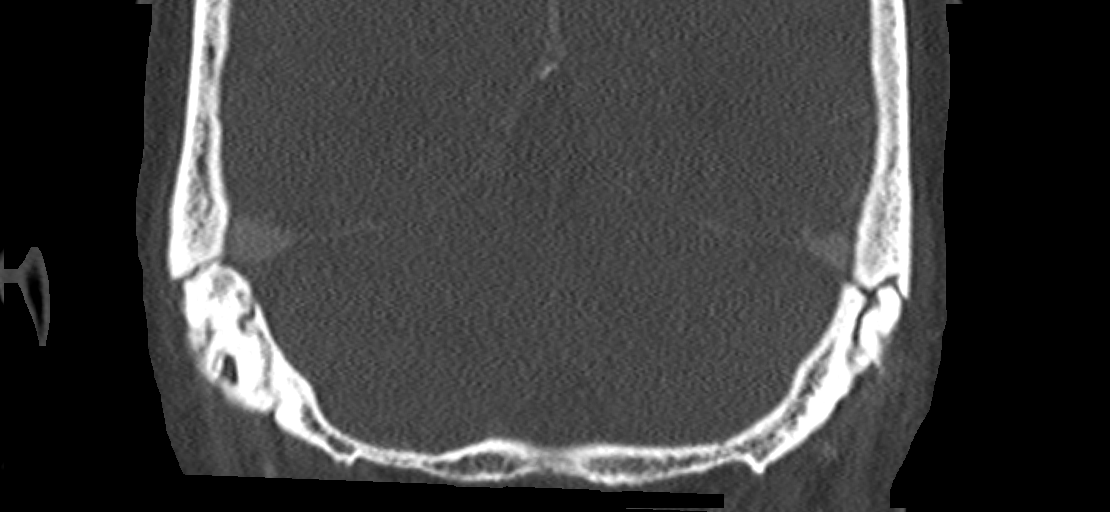
[im 55/110  bone]
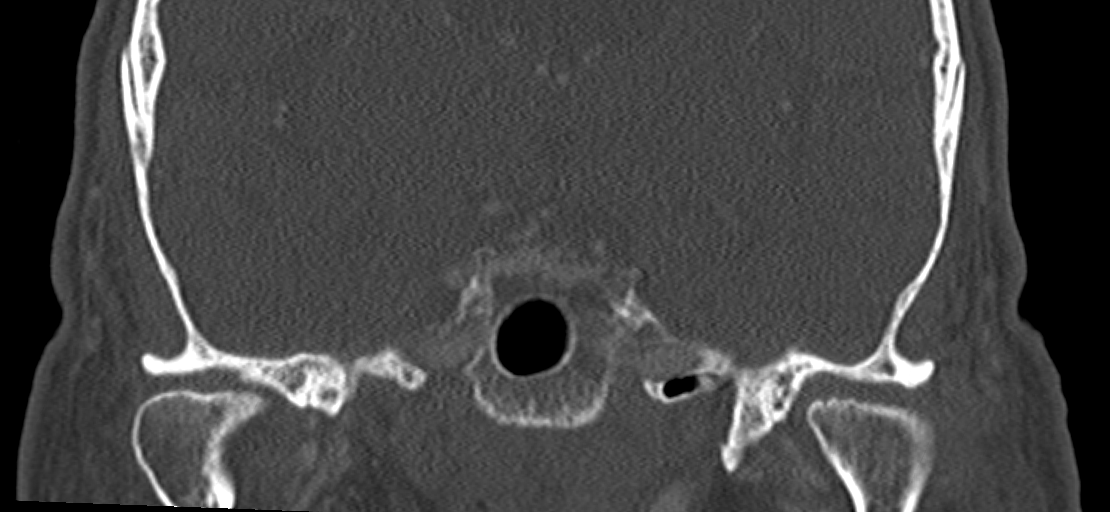
[im 82/110  bone]
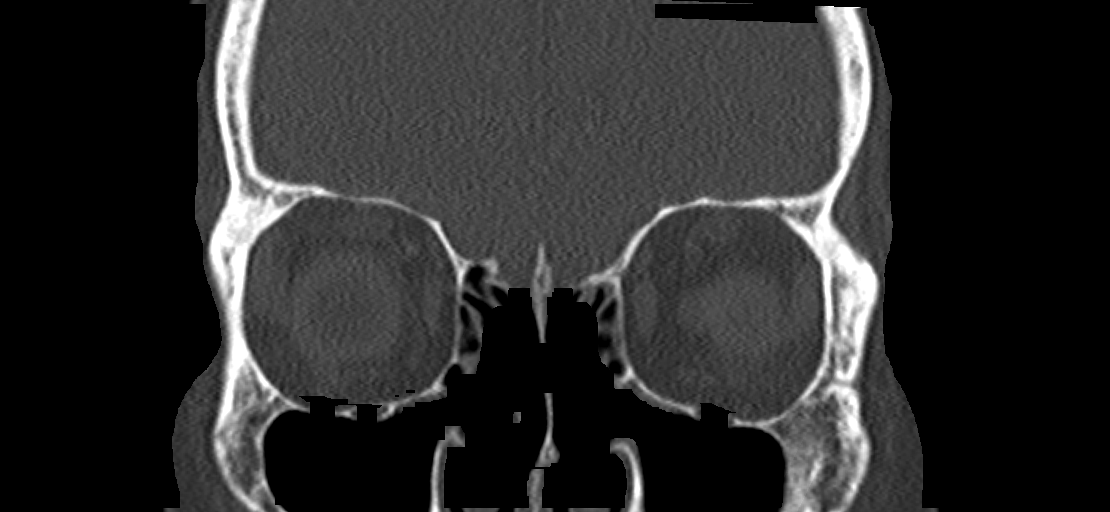

[Series 5: orbits 2.0 sagittal · sagittal · 0.18mm/px · 2 of 90 slices shown]
[im 30/90  bone]
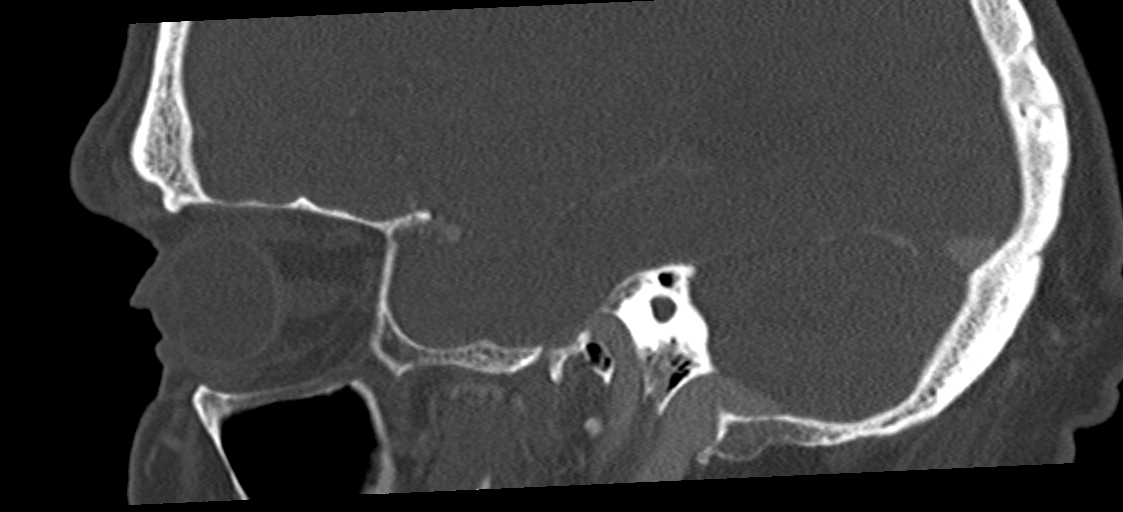
[im 60/90  bone]
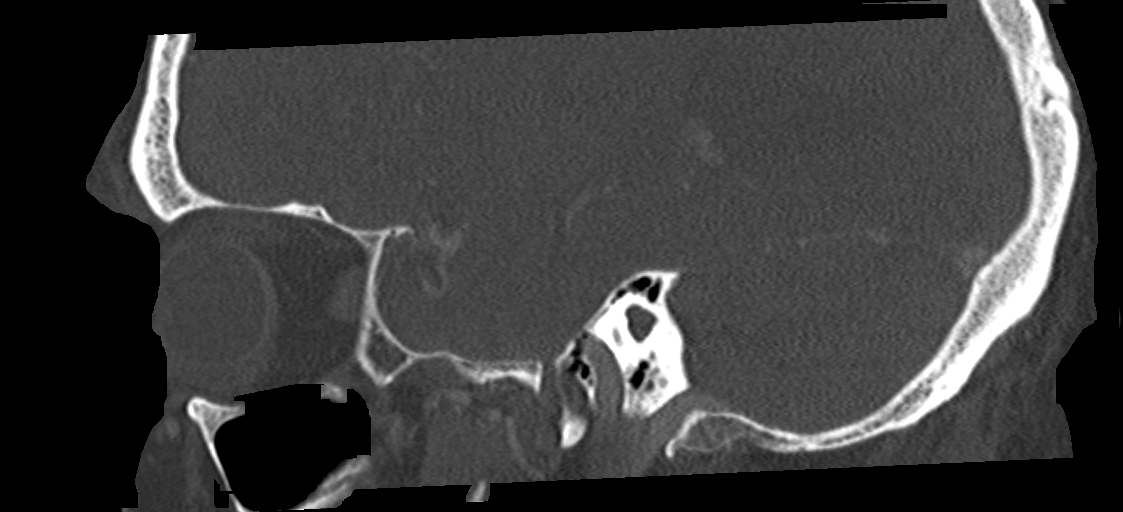

[12 of 47 positions shown; findings below may reference images not displayed]

FINDINGS: Orbits: No orbital mass or evidence of inflammation. Normal
appearance of the globes, optic nerve-sheath complexes, extraocular
muscles, orbital fat and lacrimal glands. Status post right lens
replacement.

Visible paranasal sinuses: Minimal mucosal thickening, grossly
clear.

Soft tissues: Normal.

Osseous: No fracture or aggressive lesion.

Limited intracranial: No acute or significant finding.
IMPRESSION: No acute process in the orbits.  No evidence of orbital cellulitis.

## 2021-03-26 IMAGING — CR DG CHEST 2V
2 series · 2 of 2 positions shown · non-contrast
Comparison: [DATE]

CLINICAL DATA: Altered mental status

EXAM:
CHEST - 2 VIEW

[chest lat]
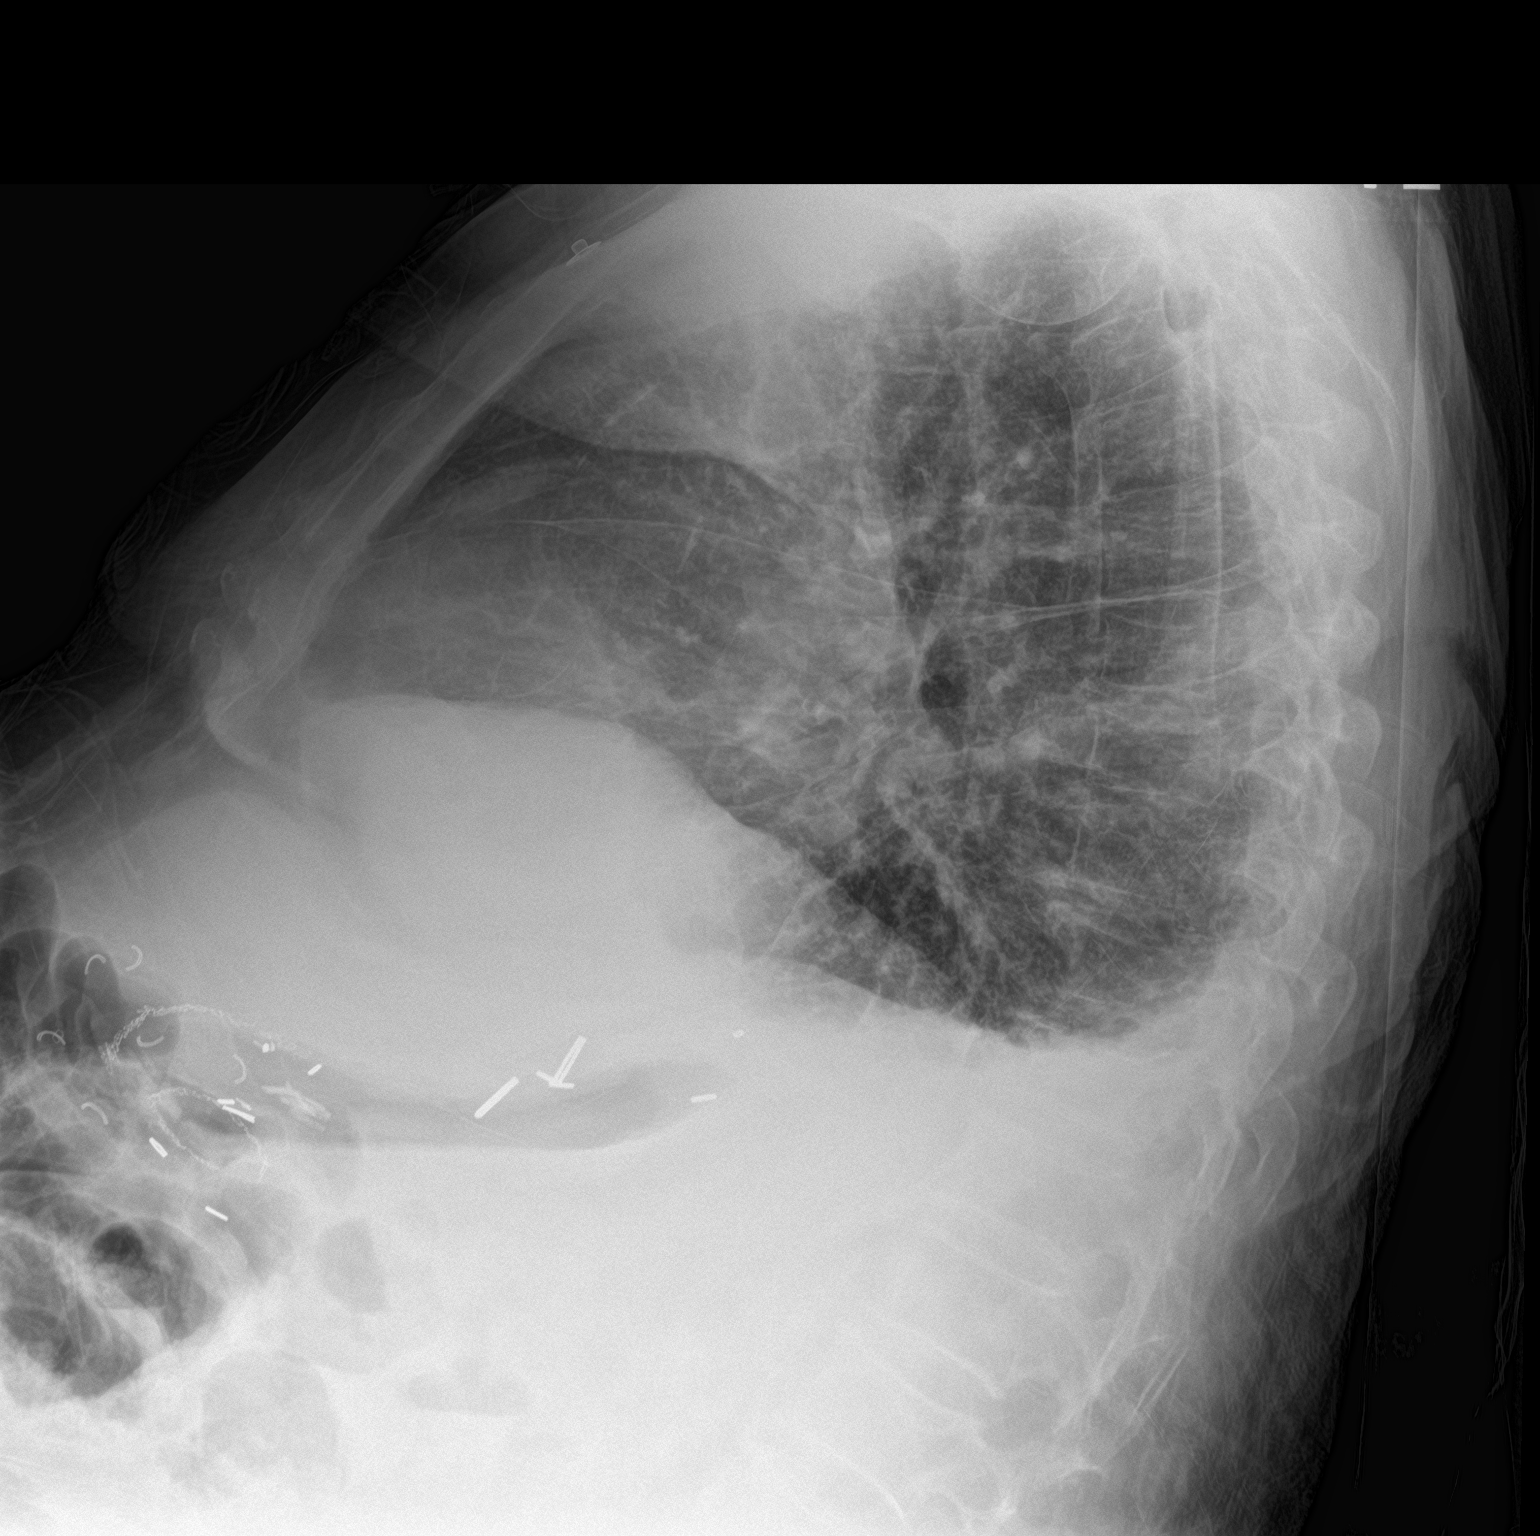

[chest ap]
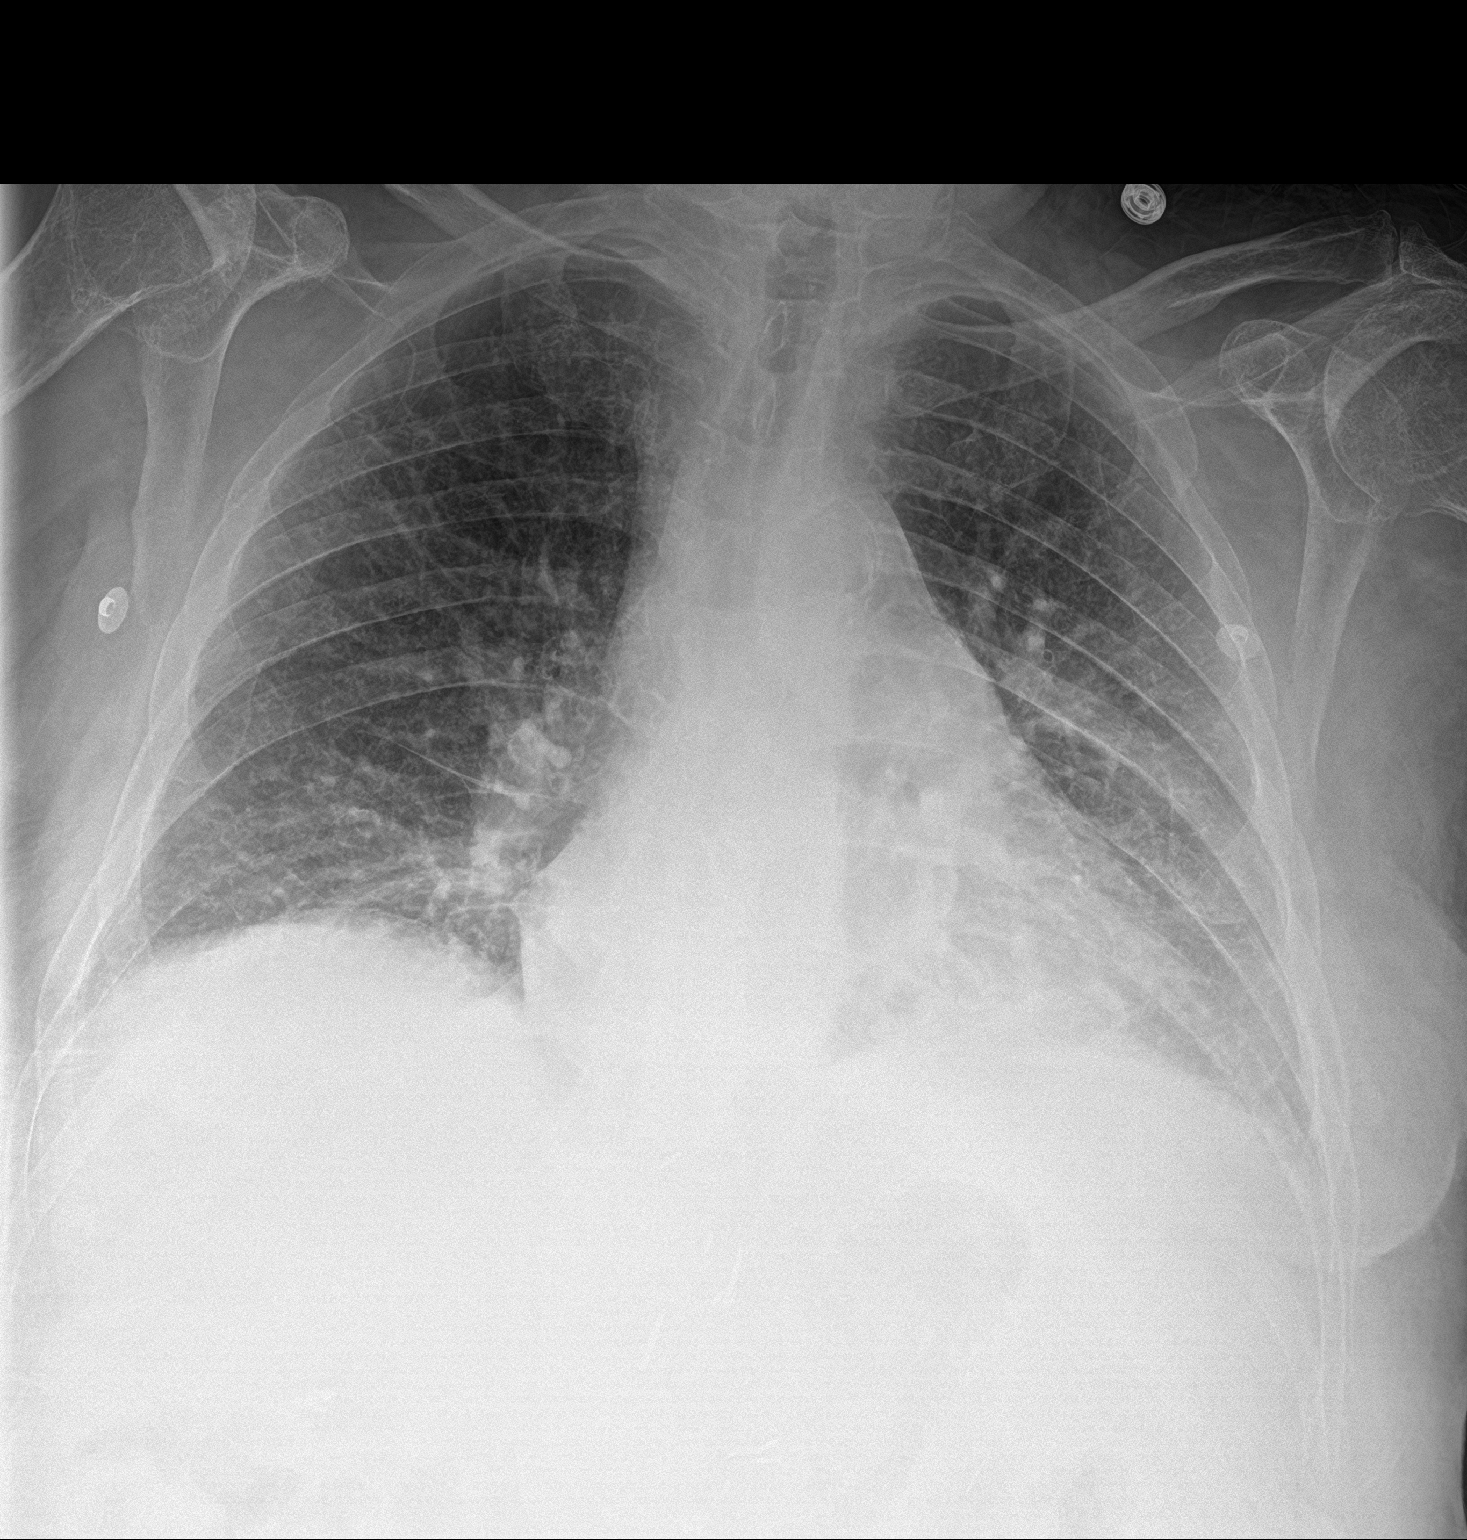

[2 of 2 positions shown; findings below may reference images not displayed]

FINDINGS: Lung volumes are small. Small bilateral pleural effusions have
developed since prior examination with associated left basilar
compressive atelectasis. No pneumothorax. Cardiac size within normal
limits. Pulmonary vascularity is normal. No acute bone abnormality.
IMPRESSION: Interval development of small bilateral pleural effusions with
associated left basilar atelectasis.

## 2021-03-26 MED ORDER — IOHEXOL 300 MG/ML  SOLN
75.0000 mL | Freq: Once | INTRAMUSCULAR | Status: AC | PRN
  Administered 2021-03-26: 75 mL via INTRAVENOUS

## 2021-03-26 NOTE — ED Triage Notes (Signed)
First RN Note: pt to ED [facility] from [facility] with c/o L eye lid droop. Per EMS pt is unable to open L eye at this time. Pt with no other neuro deficits noted at this time.     135/80 70HR 96% RA CBG 245

## 2021-03-26 NOTE — ED Provider Notes (Signed)
[facility] Emergency Department Provider Note  ____________________________________________   Event Date/Time   First MD Initiated Contact with Patient 03/26/21 1637     (approximate)  I have reviewed the triage vital signs and the nursing notes.   HISTORY  Chief Complaint Eye Pain   HPI Matthew Miles. is a 87 y.o. male patient comes back to 5 hallway and tells me that he has not had any eye pain.  He says he noted last night that his vision was going getting worse in the left eye that his eyelid would not stay up.  Currently his eyelid will not stay up without hold his eyelid up he has light perception but nothing else.  Being in 5 hallway I cannot really get a whole lot of reaction from his pupils both which are very small now.  Patient's left eye will not look fully right although we will look fully left.  Additionally will not look all the way up either.  These were all apparently new.  Patient has no other neurological deficits that he knows about.        Past Medical History:  Diagnosis Date   Allergy    Arthritis    CHF (congestive heart failure) (HCC)    Chicken pox    Cholecystitis    Colon polyps    COPD (chronic obstructive pulmonary disease) (HCC)    Coronary artery disease    Diabetes mellitus without complication (HCC)    Emphysema of lung (HCC)    GERD (gastroesophageal reflux disease)    Heart murmur    Hematemesis/vomiting blood 04/10/11   Hypercholesterolemia    Mild hypertension    Pancreatitis    Prostate cancer ([place])    Prostate cancer ([place])    radiation therapy    Smoker    Ulcer    Upper GI bleed 1980    Patient Active Problem List   Diagnosis Date Noted   Acute non intractable tension-type headache 03/10/2021   Nasal discharge with watery eyes 03/10/2021   Osteopenia 02/15/2021   B12 deficiency 07/31/2020   PAD (peripheral artery disease) ([place]) 01/09/2020   Chronic venous insufficiency 01/09/2020   Elevated  TSH 01/01/2020   Decreased pedal pulses 01/01/2020   Diabetic neuropathy ([place]) 12/31/2018   Diabetic retinopathy ([place]) 10/07/2018   Monoclonal B-cell lymphocytosis 07/18/2018   Prostate cancer ([place]) 07/04/2018   BPH (benign prostatic hyperplasia) 02/10/2017   Actinic keratoses 12/24/2015   Osteoarthritis of both hands 12/24/2015   Obesity (BMI 30-39.9) 10/21/2014   Essential hypertension 07/21/2014   Murmur, cardiac 10/15/2013   Chronic low back pain 10/08/2013   Chronic diastolic CHF (congestive heart failure) ([place]) 04/15/2013   Thrombocytopenia ([place]) 08/21/2012   Urge incontinence 07/02/2012   Hyperlipidemia 06/08/2012   H/O malignant neoplasm of prostate 06/04/2012   Diabetes ([place]) 04/26/2012   Iron deficiency anemia 01/06/2012   Pulmonary hypertension ([place]) 11/25/2011   GERD (gastroesophageal reflux disease) 06/13/2011   Coronary artery disease of native artery of native heart with stable angina pectoris ([place]) 04/26/2011   COPD (chronic obstructive pulmonary disease) [facility])     Past Surgical History:  Procedure Laterality Date   APPENDECTOMY     CARDIAC CATHETERIZATION  May 2002 and Feb 2013   [facility]; no stents    CATARACT EXTRACTION     CHOLECYSTECTOMY     CIRCUMCISION     COLONOSCOPY     HEMORRHOID SURGERY     SP CHOLECYSTOMY     STOMACH  SURGERY     bleeding ulcers, followed by Dr. [name]    Prior to Admission medications   Medication Sig Start Date End Date Taking? Authorizing Provider  Accu-Chek FastClix Lancets MISC Inject 1 each into the skin in the morning and at bedtime. 02/18/21  Yes [name], MD  ACCU-CHEK GUIDE test strip TEST BLOOD SUGAR TWICE DAILY AS DIRECTED 02/22/21  Yes [name], MD  acetaminophen (TYLENOL) 325 MG tablet Take 650 mg by mouth every 6 (six) hours as needed.   Yes [provider]  alfuzosin (UROXATRAL) 10 MG 24 hr tablet Take 1 tablet (10 mg total) by mouth daily with breakfast. 08/27/19  Yes [name],  MD  ascorbic acid (VITAMIN C) 500 MG tablet Take 500 mg by mouth 2 (two) times daily.   Yes [provider]  aspirin 81 MG tablet Take 81 mg by mouth daily.    Yes [provider]  blood glucose meter kit and supplies KIT Dispense based on patient and insurance preference. Use once daily as directed. Dx Code E11.9 02/15/21  Yes [name], MD  Calcium Citrate-Vitamin D (CALCIUM CITRATE + D PO) Take by mouth. 1200 mg of calcium and [phone] units of vitamin D daily   Yes [provider]  carvedilol (COREG) 6.25 MG tablet TAKE 1 TABLET TWICE DAILY Patient taking differently: Take 12.5 mg by mouth 2 (two) times daily with a meal. TAKE 1 TABLET TWICE DAILY 11/09/20  Yes Gollan, Kathlene November, MD  collagenase (SANTYL) ointment Apply 1 application topically daily.   Yes [provider]  ferrous sulfate 325 (65 FE) MG EC tablet Take 325 mg by mouth daily.   Yes [provider]  finasteride (PROSCAR) 5 MG tablet Take 5 mg by mouth daily.   Yes [provider]  fluconazole (DIFLUCAN) 100 MG tablet Take 100 mg by mouth daily.   Yes [provider]  fluticasone (FLONASE) 50 MCG/ACT nasal spray Place 2 sprays into both nostrils daily.   Yes [provider]  insulin lispro (HUMALOG) 100 UNIT/ML injection Inject 1-4 Units into the skin 3 (three) times daily before meals. Per sliding scale   Yes [provider]  lidocaine (LIDODERM) 5 % Place 1 patch onto the skin daily. Remove & Discard patch within 12 hours or as directed by MD   Yes [provider]  melatonin 3 MG TABS tablet Take 3 mg by mouth at bedtime.   Yes [provider]  metFORMIN (GLUCOPHAGE-XR) 500 MG 24 hr tablet TAKE 2 TABLETS(1000 MG) BY MOUTH TWICE DAILY 09/21/20  Yes [name], MD  Multiple Vitamin (MULTIVITAMIN WITH MINERALS) TABS tablet Take 1 tablet by mouth daily.   Yes [provider]  mupirocin ointment (BACTROBAN) 2 % 1  application 2 (two) times daily as needed.   Yes [provider]  nitroGLYCERIN (NITROSTAT) 0.4 MG SL tablet Place 1 tablet (0.4 mg total) under the tongue every 5 (five) minutes as needed. 11/09/20  Yes Gollan, Kathlene November, MD  oxyCODONE (OXY IR/ROXICODONE) 5 MG immediate release tablet Take 5 mg by mouth every 6 (six) hours as needed for severe pain.   Yes [provider]  pantoprazole (PROTONIX) 20 MG tablet TAKE 1 TABLET EVERY DAY 12/04/20  Yes [name], MD  pravastatin (PRAVACHOL) 40 MG tablet Take 1 tablet (40 mg total) by mouth daily. 11/09/20  Yes Minna Merritts, MD  PROAIR HFA 108 [phone] Base) MCG/ACT inhaler INHALE  2 PUFFS INTO THE LUNGS EVERY 6 HOURS AS NEEDED FOR WHEEZING OR SHORTNESS OF BREATH 10/03/16  Yes [name], MD  Tiotropium Bromide Monohydrate (SPIRIVA RESPIMAT) 2.5 MCG/ACT AERS Inhale 2 puffs into the lungs daily. 04/01/20  Yes [name], MD  torsemide (DEMADEX) 10 MG tablet TAKE 1 TABLET(10 MG) BY MOUTH TWICE DAILY AS NEEDED Patient taking differently: Take 20 mg by mouth daily. Per [name] 11/09/20  Yes Gollan, Kathlene November, MD  traZODone (DESYREL) 50 MG tablet Take 50 mg by mouth at bedtime as needed for sleep.   Yes [provider]  TRULICITY 1.5 ZG/0.1VC SOPN INJECT 1.5MG (1 PEN) SUBCUTANEOUSLY EVERY WEEK 12/15/20  Yes [name], MD  valACYclovir (VALTREX) 500 MG tablet Take 500 mg by mouth 2 (two) times daily.   Yes [provider]  zinc sulfate 220 (50 Zn) MG capsule Take 220 mg by mouth daily.   Yes [provider]  cephALEXin (KEFLEX) 500 MG capsule Take 1 capsule (500 mg total) by mouth 3 (three) times daily. Patient not taking: Reported on 03/26/2021 11/27/20   Paulette Blanch, MD  potassium chloride (KLOR-CON) 10 MEQ tablet Take 1 tablet (10 mEq total) by mouth every other day. Patient not taking: Reported on 03/26/2021 11/09/20   Minna Merritts, MD  predniSONE (DELTASONE) 20 MG tablet Take 2  tablets(52m) by mouth each morning for 7 days, then 1 tablet (274m by mouth each morning for the next 7 days. Patient not taking: Reported on 03/26/2021 03/10/21   [provider]    Allergies Oxybutynin, Sulfa antibiotics, and Tradjenta [linagliptin]  Family History  Problem Relation Age of Onset   Prostate cancer Father    Liver cancer Brother    Prostate cancer Brother    Emphysema Sister        smoker    Social History Social History   Tobacco Use   Smoking status: Former    Packs/day: 1.00    Years: 65.00    Pack years: 65.00    Types: Cigarettes    Quit date: 04/10/2011    Years since quitting: 9.9   Smokeless tobacco: Former    Types: ChNurse, children'sse: Former  Substance Use Topics   Alcohol use: Yes    Comment: 1 beer rarely   Drug use: No    Review of Systems  Constitutional: No fever/chills Eyes: visual changes. ENT: No sore throat. Cardiovascular: Denies chest pain. Respiratory: Denies shortness of breath. Gastrointestinal: No abdominal pain.  No nausea, no vomiting.  No diarrhea.  No constipation. Genitourinary: Negative for dysuria. Musculoskeletal: Negative for back pain. Skin: Negative for rash. Neurological: Negative for headaches, focal weakness   ____________________________________________   PHYSICAL EXAM:  VITAL SIGNS: ED Triage Vitals  Enc Vitals Group     BP 03/26/21 1644 (!) 141/58     Pulse Rate 03/26/21 1644 74     Resp 03/26/21 1644 20     Temp 03/26/21 1644 97.9 F (36.6 C)     Temp Source 03/26/21 1644 Oral     SpO2 03/26/21 1644 93 %     Weight 03/26/21 1645 169 lb (76.7 kg)     Height 03/26/21 1645 5' 4"  (1.626 m)     Head Circumference --      [place] --      Pain Score 03/26/21 1645 3     Pain Loc --      Pain Edu? --  Excl. in [place]? --     Constitutional: Alert and oriented. Well appearing and in no acute distress. Eyes: Conjunctivae are normal.  Eye exam as noted in HPI Head:  Atraumatic. Nose: No congestion/rhinnorhea. Mouth/Throat: Mucous membranes are moist.  Oropharynx non-erythematous. Neck: No stridor.  Cardiovascular: Normal rate, regular rhythm. Grossly normal heart sounds.  Good peripheral circulation. Respiratory: Normal respiratory effort.  No retractions. Lungs CTAB. Gastrointestinal: Soft and nontender. No distention. No abdominal bruits. No CVA tenderness. Musculoskeletal: No lower extremity tenderness nor edema.   Neurologic:  Normal speech and language.  Cranial nerves II through XII are intact with the exception that the left eye only has light perception will not look all the way to the right or all the way up.  It does appear to look down fairly well.  This may be a spurious finding because the patient is laying semireclined and is having trouble looking down even with his good eye.  Visual fields in the right eye are intact to confrontation.  Cerebellar finger-to-nose is normal motor strength is 5/5 throughout and patient does not report any numbness when asked. Skin:  Skin is warm, dry and intact. No rash noted.   ____________________________________________   LABS (all labs ordered are listed, but only abnormal results are displayed)  Labs Reviewed  CBC WITH DIFFERENTIAL/PLATELET - Abnormal; Notable for the following components:      Result Value   WBC 10.7 (*)    RBC 3.26 (*)    Hemoglobin 10.4 (*)    HCT 31.1 (*)    Neutro Abs 8.7 (*)    Abs Immature Granulocytes 0.09 (*)    All other components within normal limits  BASIC METABOLIC PANEL - Abnormal; Notable for the following components:   Sodium 127 (*)    Chloride 93 (*)    Glucose, Bld 184 (*)    BUN 25 (*)    Calcium 8.3 (*)    All other components within normal limits  CULTURE, BLOOD (ROUTINE X 2)  CULTURE, BLOOD (ROUTINE X 2)  URINE CULTURE  RESP PANEL BY RT-PCR (FLU A&B, COVID) ARPGX2  LACTIC ACID, PLASMA  LACTIC ACID, PLASMA  URINALYSIS, ROUTINE W REFLEX MICROSCOPIC   HEPATIC FUNCTION PANEL   ____________________________________________  EKG   ____________________________________________  RADIOLOGY Gertha Calkin, personally viewed and evaluated these images (plain radiographs) as part of my medical decision making, as well as reviewing the written report by the radiologist.  ED MD interpretation: CT orbits read by radiology reviewed by me is negative  Official radiology report(s): CT Head Wo Contrast  Result Date: 03/26/2021 CLINICAL DATA:  Left eye droop EXAM: CT HEAD WITHOUT CONTRAST TECHNIQUE: Contiguous axial images were obtained from the base of the skull through the vertex without intravenous contrast. COMPARISON:  03/26/2021 FINDINGS: Brain: Mild atrophic changes and chronic white matter ischemic changes are identified. A few small lacunar infarcts are noted within the basal ganglia on the right stable in appearance from the prior exam. No acute hemorrhage or acute infarction is seen. Vascular: No hyperdense vessel or unexpected calcification. Skull: Normal. Negative for fracture or focal lesion. Sinuses/Orbits: No acute finding. Other: None. IMPRESSION: Chronic atrophic and ischemic changes without acute abnormality. Electronically Signed   By: Inez Catalina M.D.   On: 03/26/2021 21:21   CT Orbits W Contrast  Result Date: 03/26/2021 CLINICAL DATA:  Orbital cellulitis suspected, left eyelid droop EXAM: CT ORBITS WITH CONTRAST TECHNIQUE: Multidetector CT images was performed according to the standard  protocol following intravenous contrast administration. CONTRAST:  59m OMNIPAQUE IOHEXOL 300 MG/ML  SOLN COMPARISON:  No prior CT orbits, correlation is made with 03/07/2021 CT head FINDINGS: Orbits: No orbital mass or evidence of inflammation. Normal appearance of the globes, optic nerve-sheath complexes, extraocular muscles, orbital fat and lacrimal glands. Status post right lens replacement. Visible paranasal sinuses: Minimal mucosal  thickening, grossly clear. Soft tissues: Normal. Osseous: No fracture or aggressive lesion. Limited intracranial: No acute or significant finding. IMPRESSION: No acute process in the orbits.  No evidence of orbital cellulitis. Electronically Signed   By: AMerilyn BabaM.D.   On: 03/26/2021 19:28    ____________________________________________   PROCEDURES  Procedure(s) performed (including Critical Care):  Procedures   ____________________________________________   INITIAL IMPRESSION / ASSESSMENT AND PLAN / ED COURSE Patient is 87years old has diabetes and hypertension.  He is a set up for stroke.  I will get a CT of his head and if this is negative we will follow with an MRI.  He is already had contrast tonight.  Depending on the findings of these 2 studies we will either contact neurology which seems much more likely or possibly the eye doctor or possibly both.    ----------------------------------------- 10:52 PM on 03/26/2021 ----------------------------------------- Family member tells me patient is acting a little unusual picking at things in the air that are not there she has been standing next to him now for about an hour and talking to them.  She reports she does not usually do this.  He has been feeling hot and cold to.  I will get chest x-ray urinalysis etc.   ----------------------------------------- 11:15 PM on 03/26/2021 ----------------------------------------- Patient going for MRI now.  Labs that I ordered for his altered mental status are still pending.  I have signed him out to Dr. FKarma Greaser  I anticipate he will need admission at least for the altered mental status and the hyponatremia.  I would be surprised if he was not admitted for the visual loss as well.  Even though its unilateral he also has the droopy eyelid and the limitation of movement.  MRI should see any kind of aneurysm that may be present.       ____________________________________________   FINAL  CLINICAL IMPRESSION(S) / ED DIAGNOSES  Final diagnoses:  Hyponatremia  Altered mental status, unspecified altered mental status type  Acute loss of vision, left  Droopy eyelid, left     ED Discharge Orders     None        Note:  This document was prepared using Dragon voice recognition software and may include unintentional dictation errors.    MNena Polio MD 03/26/21 [phone]

## 2021-03-26 NOTE — ED Triage Notes (Signed)
Pt via EMS from [facility]. Pt c/o L eye pain and states he is unable to open it at this time. No other issues noted. Pt states some drainage but it is minimally. Pt is A&OX4 and NAD.

## 2021-03-26 NOTE — ED Provider Notes (Signed)
°  Emergency Medicine Provider Triage Evaluation Note  Matthew Miles. , a 87 y.o.male,  was evaluated in triage.  Pt complains of eye pain.  Patient states that he has noted swelling and pain around his left eye this past week patient states he is unable to open his eye.  Additionally, he states that he has been experiencing yellowish/green discharge coming from his eye.  Denies fever/chills, flashes/floaters in vision, chest pain, shortness of breath, or sinus congestion.   Review of Systems  Positive: Left eye pain. Negative: Denies fever, chest pain, vomiting  Physical Exam   Vitals:   03/26/21 1644  BP: (!) 141/58  Pulse: 74  Resp: 20  Temp: 97.9 F (36.6 C)  SpO2: 93%   Gen:   Awake, no distress   Resp:  Normal effort  MSK:   Moves extremities without difficulty  Other:  Mild erythema around the left orbit.  Eyelid is swollen shut. Pain with extraocular movements. Right eye is unable to track medially.   Medical Decision Making  Given the patient's initial medical screening exam, the following diagnostic evaluation has been ordered. The patient will be placed in the appropriate treatment space, once one is available, to complete the evaluation and treatment. I have discussed the plan of care with the patient and I have advised the patient that an ED physician or mid-level practitioner will reevaluate their condition after the test results have been received, as the results may give them additional insight into the type of treatment they may need.    Diagnostics: Labs, cultures  Treatments: None immediately   Teodoro Spray, [place] 03/26/21 1655    Vanessa Stapleton, MD 03/27/21 1102

## 2021-03-27 ENCOUNTER — Emergency Department: Payer: Medicare HMO

## 2021-03-27 DIAGNOSIS — J439 Emphysema, unspecified: Secondary | ICD-10-CM | POA: Diagnosis present

## 2021-03-27 DIAGNOSIS — Z7189 Other specified counseling: Secondary | ICD-10-CM | POA: Diagnosis not present

## 2021-03-27 DIAGNOSIS — G9341 Metabolic encephalopathy: Secondary | ICD-10-CM | POA: Diagnosis not present

## 2021-03-27 DIAGNOSIS — Z8546 Personal history of malignant neoplasm of prostate: Secondary | ICD-10-CM | POA: Diagnosis not present

## 2021-03-27 DIAGNOSIS — R410 Disorientation, unspecified: Secondary | ICD-10-CM | POA: Diagnosis not present

## 2021-03-27 DIAGNOSIS — R4182 Altered mental status, unspecified: Secondary | ICD-10-CM | POA: Diagnosis not present

## 2021-03-27 DIAGNOSIS — H4902 Third [oculomotor] nerve palsy, left eye: Secondary | ICD-10-CM | POA: Diagnosis not present

## 2021-03-27 DIAGNOSIS — E871 Hypo-osmolality and hyponatremia: Secondary | ICD-10-CM

## 2021-03-27 DIAGNOSIS — G928 Other toxic encephalopathy: Secondary | ICD-10-CM | POA: Diagnosis present

## 2021-03-27 DIAGNOSIS — J9602 Acute respiratory failure with hypercapnia: Secondary | ICD-10-CM | POA: Diagnosis not present

## 2021-03-27 DIAGNOSIS — H02402 Unspecified ptosis of left eyelid: Secondary | ICD-10-CM

## 2021-03-27 DIAGNOSIS — D630 Anemia in neoplastic disease: Secondary | ICD-10-CM | POA: Diagnosis present

## 2021-03-27 DIAGNOSIS — E1136 Type 2 diabetes mellitus with diabetic cataract: Secondary | ICD-10-CM | POA: Diagnosis present

## 2021-03-27 DIAGNOSIS — E512 Wernicke's encephalopathy: Secondary | ICD-10-CM | POA: Diagnosis not present

## 2021-03-27 DIAGNOSIS — I272 Pulmonary hypertension, unspecified: Secondary | ICD-10-CM | POA: Diagnosis present

## 2021-03-27 DIAGNOSIS — I110 Hypertensive heart disease with heart failure: Secondary | ICD-10-CM | POA: Diagnosis present

## 2021-03-27 DIAGNOSIS — Z515 Encounter for palliative care: Secondary | ICD-10-CM | POA: Diagnosis not present

## 2021-03-27 DIAGNOSIS — J690 Pneumonitis due to inhalation of food and vomit: Secondary | ICD-10-CM | POA: Diagnosis present

## 2021-03-27 DIAGNOSIS — Z20822 Contact with and (suspected) exposure to covid-19: Secondary | ICD-10-CM | POA: Diagnosis present

## 2021-03-27 DIAGNOSIS — F05 Delirium due to known physiological condition: Secondary | ICD-10-CM | POA: Diagnosis present

## 2021-03-27 DIAGNOSIS — N179 Acute kidney failure, unspecified: Secondary | ICD-10-CM | POA: Diagnosis not present

## 2021-03-27 DIAGNOSIS — C8307 Small cell B-cell lymphoma, spleen: Secondary | ICD-10-CM | POA: Diagnosis present

## 2021-03-27 DIAGNOSIS — D696 Thrombocytopenia, unspecified: Secondary | ICD-10-CM | POA: Diagnosis present

## 2021-03-27 DIAGNOSIS — G450 Vertebro-basilar artery syndrome: Secondary | ICD-10-CM | POA: Diagnosis present

## 2021-03-27 DIAGNOSIS — J9601 Acute respiratory failure with hypoxia: Secondary | ICD-10-CM | POA: Diagnosis not present

## 2021-03-27 DIAGNOSIS — I5033 Acute on chronic diastolic (congestive) heart failure: Secondary | ICD-10-CM | POA: Diagnosis not present

## 2021-03-27 DIAGNOSIS — H499 Unspecified paralytic strabismus: Secondary | ICD-10-CM | POA: Diagnosis not present

## 2021-03-27 DIAGNOSIS — R0609 Other forms of dyspnea: Secondary | ICD-10-CM | POA: Diagnosis not present

## 2021-03-27 DIAGNOSIS — Z66 Do not resuscitate: Secondary | ICD-10-CM | POA: Diagnosis not present

## 2021-03-27 DIAGNOSIS — L89153 Pressure ulcer of sacral region, stage 3: Secondary | ICD-10-CM | POA: Diagnosis present

## 2021-03-27 DIAGNOSIS — B3749 Other urogenital candidiasis: Secondary | ICD-10-CM | POA: Diagnosis present

## 2021-03-27 DIAGNOSIS — E43 Unspecified severe protein-calorie malnutrition: Secondary | ICD-10-CM | POA: Diagnosis present

## 2021-03-27 LAB — URINALYSIS, ROUTINE W REFLEX MICROSCOPIC
Bilirubin Urine: NEGATIVE
Glucose, UA: NEGATIVE mg/dL
Ketones, ur: NEGATIVE mg/dL
Nitrite: NEGATIVE
Protein, ur: NEGATIVE mg/dL
Specific Gravity, Urine: 1.012 (ref 1.005–1.030)
Squamous Epithelial / HPF: NONE SEEN (ref 0–5)
pH: 5 (ref 5.0–8.0)

## 2021-03-27 LAB — RESP PANEL BY RT-PCR (FLU A&B, COVID) ARPGX2
Influenza A by PCR: NEGATIVE
Influenza B by PCR: NEGATIVE
SARS Coronavirus 2 by RT PCR: NEGATIVE

## 2021-03-27 LAB — HEPATIC FUNCTION PANEL
ALT: 116 U/L — ABNORMAL HIGH (ref 0–44)
AST: 76 U/L — ABNORMAL HIGH (ref 15–41)
Albumin: 2.8 g/dL — ABNORMAL LOW (ref 3.5–5.0)
Alkaline Phosphatase: 517 U/L — ABNORMAL HIGH (ref 38–126)
Bilirubin, Direct: 0.2 mg/dL (ref 0.0–0.2)
Indirect Bilirubin: 0.6 mg/dL (ref 0.3–0.9)
Total Bilirubin: 0.8 mg/dL (ref 0.3–1.2)
Total Protein: 6.1 g/dL — ABNORMAL LOW (ref 6.5–8.1)

## 2021-03-27 LAB — LIPID PANEL
Cholesterol: 132 mg/dL (ref 0–200)
HDL: 24 mg/dL — ABNORMAL LOW (ref >40)
LDL Cholesterol: 74 mg/dL (ref 0–99)
Total CHOL/HDL Ratio: 5.5 RATIO
Triglycerides: 168 mg/dL — ABNORMAL HIGH (ref <150)
VLDL: 34 mg/dL (ref 0–40)

## 2021-03-27 IMAGING — MR MR MRA HEAD W/O CM
1 series · 18 of 48 positions shown · IV contrast (gadavist)
Comparison: None.

CLINICAL DATA: Neuro deficit, stroke suspected, left third nerve
palsy, concern for aneurysm

EXAM:
MRA NECK WITHOUT AND WITH CONTRAST
MRA HEAD WITHOUT CONTRAST
TECHNIQUE: Multiplanar and multiecho pulse sequences of the neck were obtained
without and with intravenous contrast. Angiographic images of the
neck were obtained using MRA technique without and with intravenous
contrast; Angiographic images of the Circle of Willis were obtained
using MRA technique without intravenous contrast.
CONTRAST:  8mL GADAVIST GADOBUTROL 1 MMOL/ML IV SOLN

[Series 9: TOF · axial · 0.5mm · 0.41mm/px · z∈[-70,+23]mm · 18 of 205 slices shown]
[im 1/205]
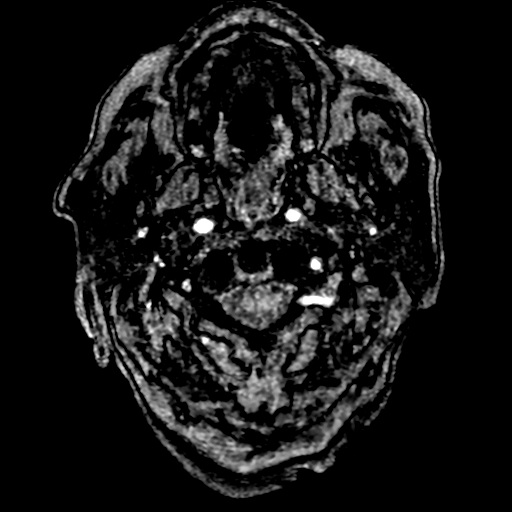
[im 5/205]
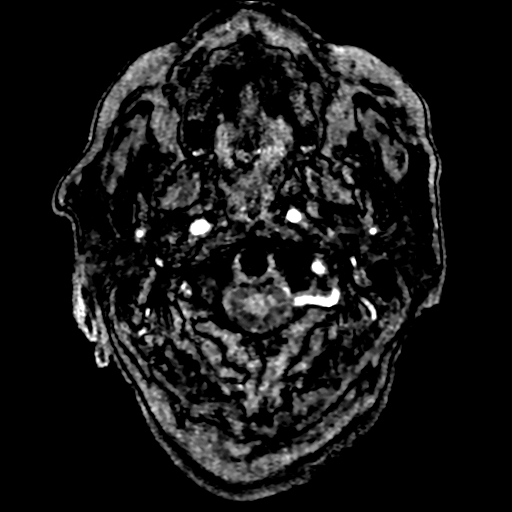
[im 9/205]
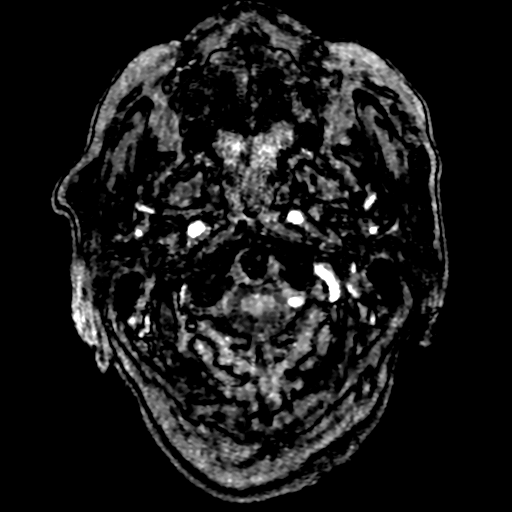
[im 14/205]
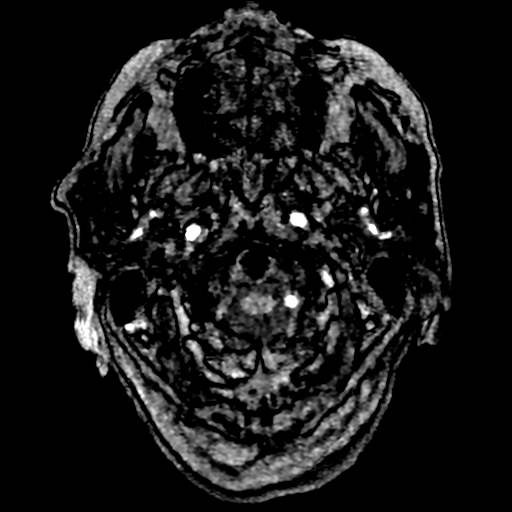
[im 18/205]
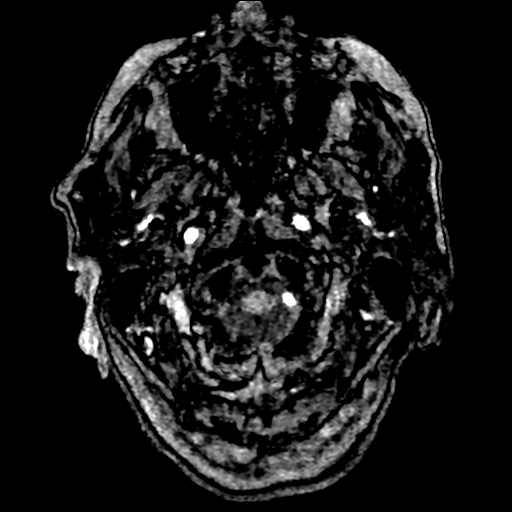
[im 22/205]
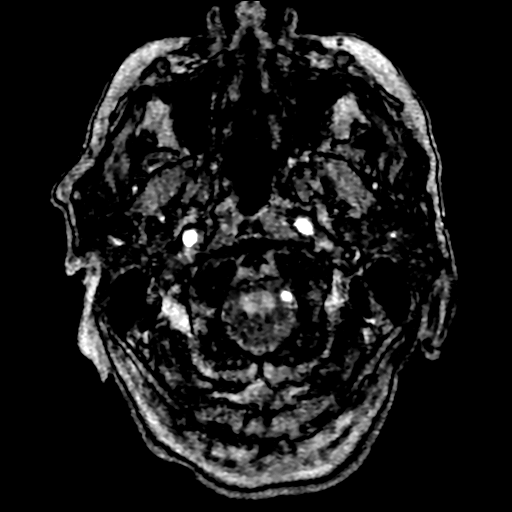
[im 27/205]
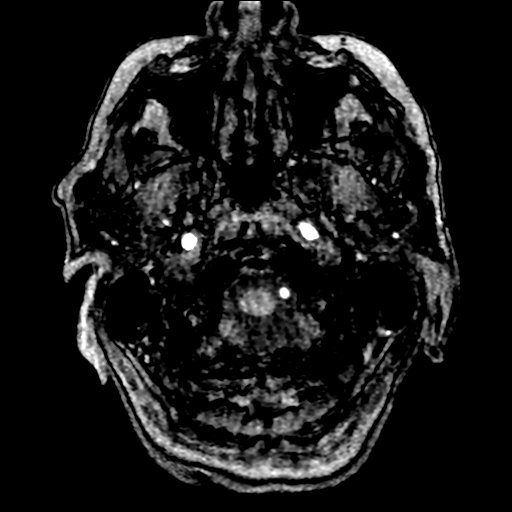
[im 31/205]
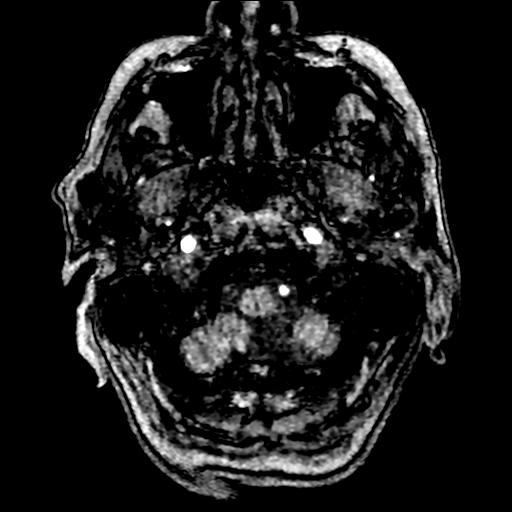
[im 35/205]
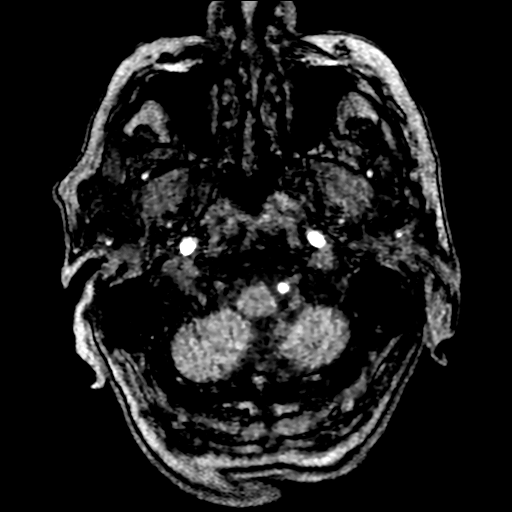
[im 40/205]
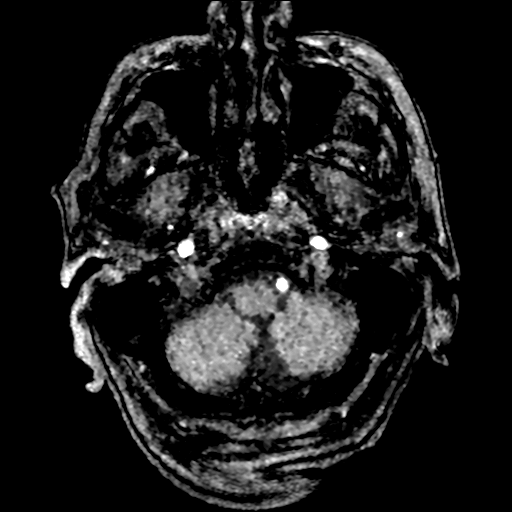
[im 66/205]
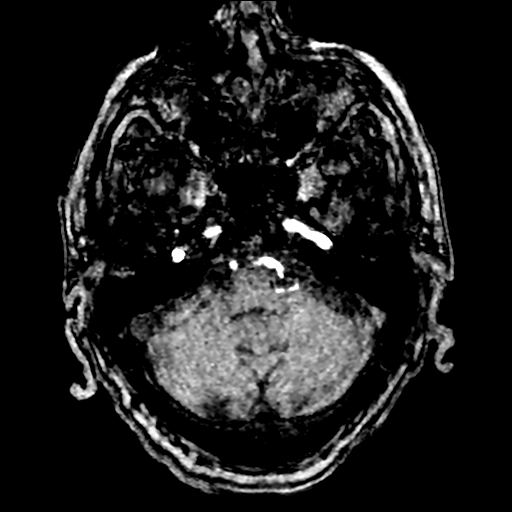
[im 92/205]
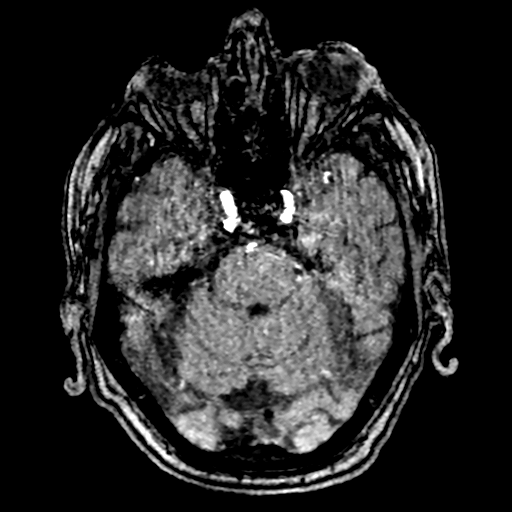
[im 105/205]
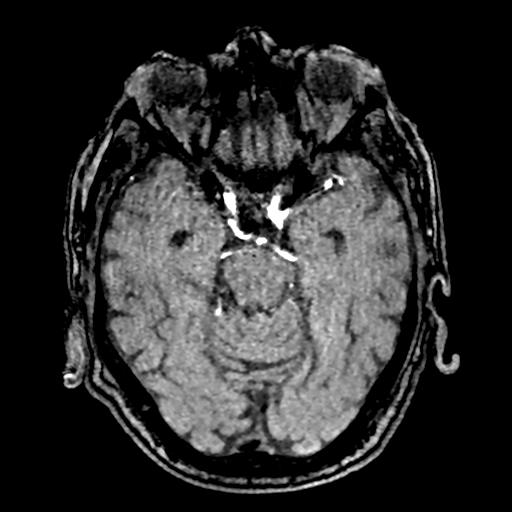
[im 118/205]
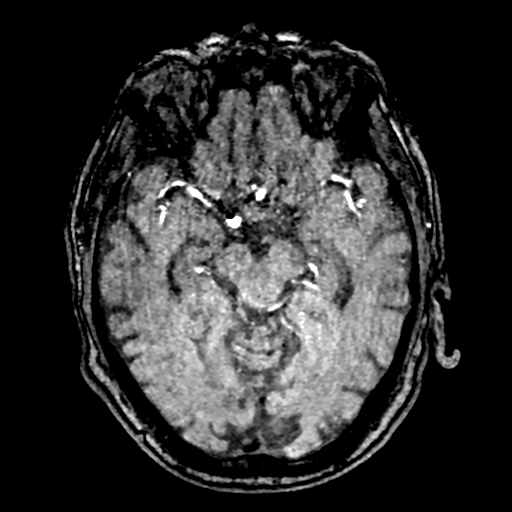
[im 144/205]
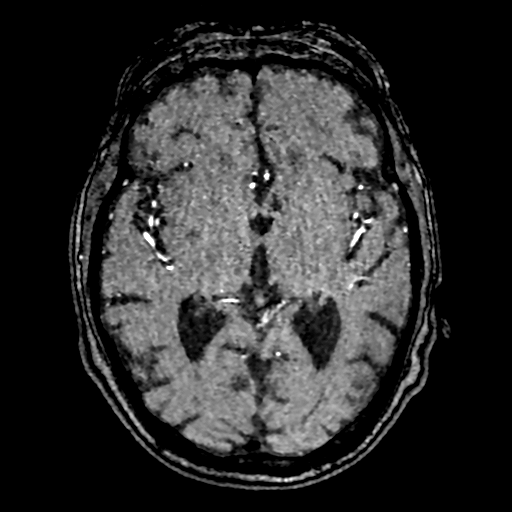
[im 170/205]
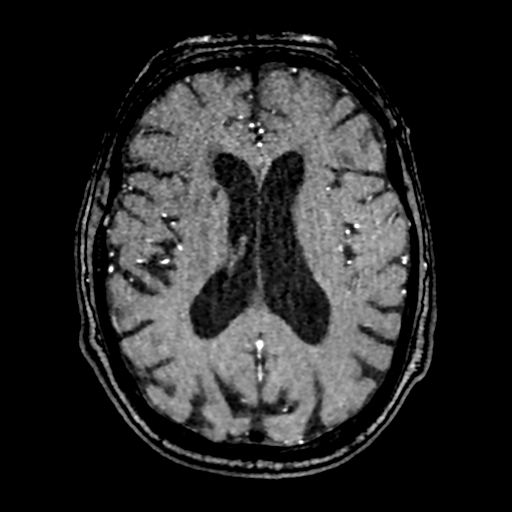
[im 174/205]
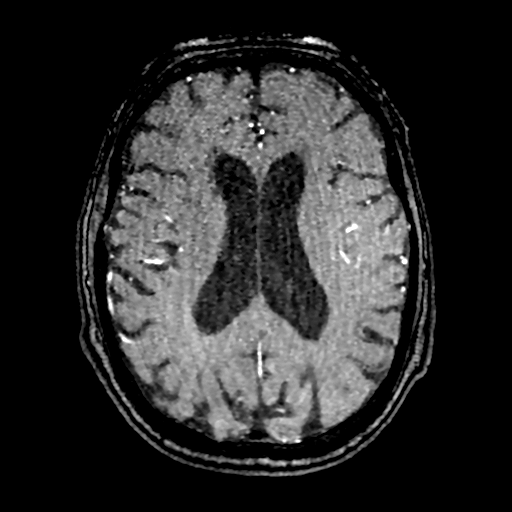
[im 196/205]
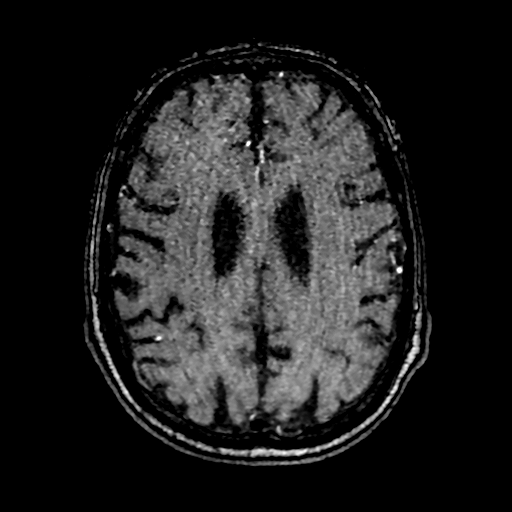

[18 of 48 positions shown; findings below may reference images not displayed]

FINDINGS: MRA NECK FINDINGS

Three-vessel aortic arch. The proximal left common carotid is not
well evaluated due to artifact. No evidence of aortic aneurysm or
dissection.

No evidence of hemodynamically significant stenosis (greater than
50%), dissection, or occlusion in the bilateral common and internal
carotid arteries.

The right vertebral artery is not well visualized for the entirety
of its course, which may indicate slow flow or stenosis. The origin
of the left vertebral artery is not well visualized, secondary to
artifact. The remainder of the left vertebral artery is patent,
without significant stenosis (greater than 50%), dissection, or
occlusion.

MRA HEAD FINDINGS

Both internal carotid arteries are patent to the termini, without
stenosis. Somewhat fusiform dilatation of the right cavernous ICA
(series 9, image 95). Focal stenosis of the origin of the left A1
(series 9, image 107). Normal right A1. Anterior cerebral arteries
are patent to their distal aspects. No M1 stenosis or occlusion.
Normal MCA bifurcations. Distal MCA branches perfused and relatively
symmetric.

The right vertebral artery is not visualized. The left vertebral
artery is patent to the vertebrobasilar junction. The basilar artery
demonstrates focal loss of signal (series 9, image 83), likely
severe focal stenosis or occlusion. Focal narrowing is also noted in
the right P1 segment, with a patent right posterior communicating
artery. The left P1 is not visualized. Patent left posterior
communicating artery with fetal origin of the left PCA. Focal
narrowing at the origin of the right P3 branch (series 9, image
131). PCAs are grossly patent to their distal aspects.
IMPRESSION: 1. The right vertebral artery is not well visualized for the
entirety of its course, both extra cranially and intracranially,
likely occluded although slow flow or severe stenosis can appear
similar. Consider CTA head and neck for further evaluation.
2. Fusiform dilatation of the right cavernous ICA.
3. Severe focal stenosis or occlusion of the basilar artery, with
reconstitution.
4. Focal stenosis in the left P1 and left A1. Narrowing is also
noted at the origin of the right P3 branch.
5. The origin of the left vertebral artery and left common carotid
artery are not well visualized, secondary to artifact.

## 2021-03-27 IMAGING — MR MR MRA NECK WO/W CM
1 of 2 series · 17 of 48 positions shown · IV contrast (gadavist)
Comparison: None.

CLINICAL DATA: Neuro deficit, stroke suspected, left third nerve
palsy, concern for aneurysm

EXAM:
MRA NECK WITHOUT AND WITH CONTRAST
MRA HEAD WITHOUT CONTRAST
TECHNIQUE: Multiplanar and multiecho pulse sequences of the neck were obtained
without and with intravenous contrast. Angiographic images of the
neck were obtained using MRA technique without and with intravenous
contrast; Angiographic images of the Circle of Willis were obtained
using MRA technique without intravenous contrast.
CONTRAST:  8mL GADAVIST GADOBUTROL 1 MMOL/ML IV SOLN

[Series 12: angio_fl3d_cor_post_ttc=2.0s · coronal · 0.9mm · 0.85mm/px · 17 of 96 slices shown]
[im 1/96]
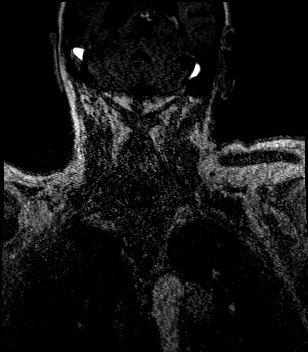
[im 6/96]
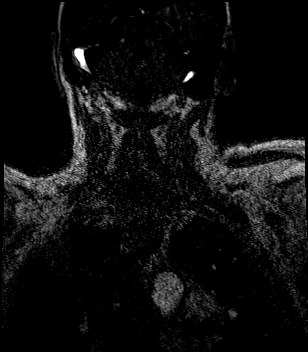
[im 12/96]
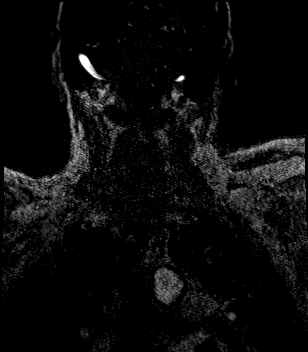
[im 18/96]
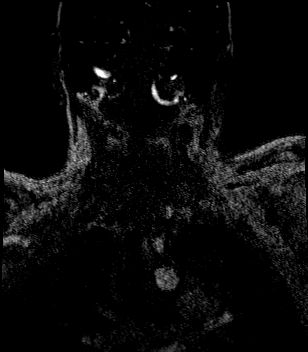
[im 24/96]
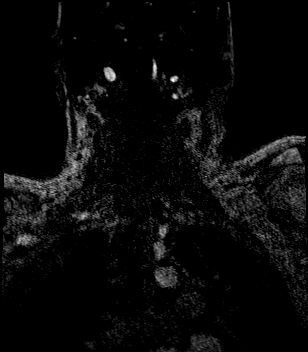
[im 30/96]
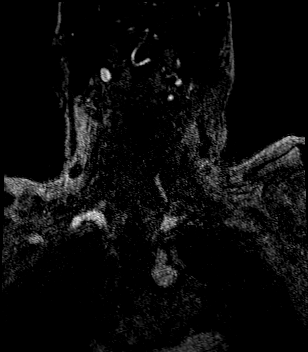
[im 36/96]
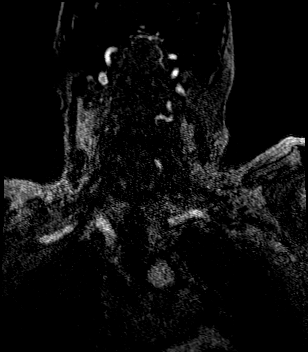
[im 42/96]
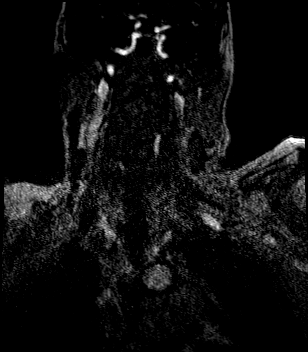
[im 48/96]
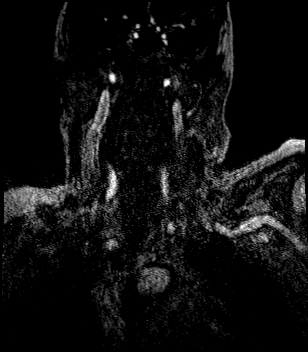
[im 54/96]
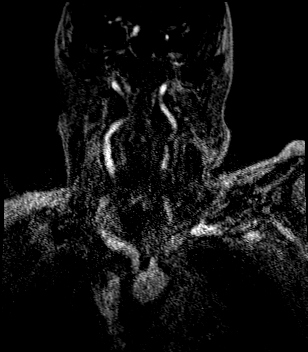
[im 60/96]
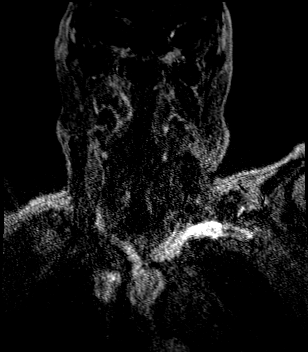
[im 66/96]
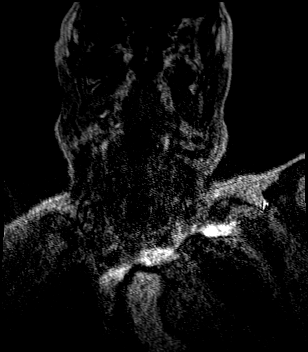
[im 72/96]
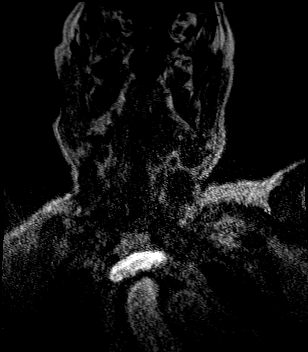
[im 78/96]
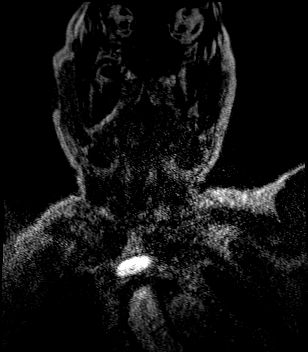
[im 84/96]
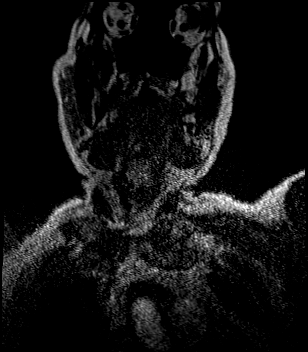
[im 90/96]
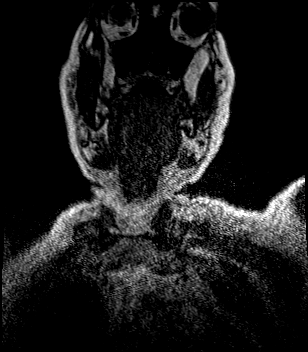
[im 96/96]
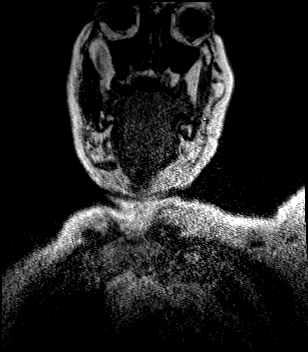

[17 of 48 positions shown; findings below may reference images not displayed]

FINDINGS: MRA NECK FINDINGS

Three-vessel aortic arch. The proximal left common carotid is not
well evaluated due to artifact. No evidence of aortic aneurysm or
dissection.

No evidence of hemodynamically significant stenosis (greater than
50%), dissection, or occlusion in the bilateral common and internal
carotid arteries.

The right vertebral artery is not well visualized for the entirety
of its course, which may indicate slow flow or stenosis. The origin
of the left vertebral artery is not well visualized, secondary to
artifact. The remainder of the left vertebral artery is patent,
without significant stenosis (greater than 50%), dissection, or
occlusion.

MRA HEAD FINDINGS

Both internal carotid arteries are patent to the termini, without
stenosis. Somewhat fusiform dilatation of the right cavernous ICA
(series 9, image 95). Focal stenosis of the origin of the left A1
(series 9, image 107). Normal right A1. Anterior cerebral arteries
are patent to their distal aspects. No M1 stenosis or occlusion.
Normal MCA bifurcations. Distal MCA branches perfused and relatively
symmetric.

The right vertebral artery is not visualized. The left vertebral
artery is patent to the vertebrobasilar junction. The basilar artery
demonstrates focal loss of signal (series 9, image 83), likely
severe focal stenosis or occlusion. Focal narrowing is also noted in
the right P1 segment, with a patent right posterior communicating
artery. The left P1 is not visualized. Patent left posterior
communicating artery with fetal origin of the left PCA. Focal
narrowing at the origin of the right P3 branch (series 9, image
131). PCAs are grossly patent to their distal aspects.
IMPRESSION: 1. The right vertebral artery is not well visualized for the
entirety of its course, both extra cranially and intracranially,
likely occluded although slow flow or severe stenosis can appear
similar. Consider CTA head and neck for further evaluation.
2. Fusiform dilatation of the right cavernous ICA.
3. Severe focal stenosis or occlusion of the basilar artery, with
reconstitution.
4. Focal stenosis in the left P1 and left A1. Narrowing is also
noted at the origin of the right P3 branch.
5. The origin of the left vertebral artery and left common carotid
artery are not well visualized, secondary to artifact.

## 2021-03-27 MED ORDER — STROKE: EARLY STAGES OF RECOVERY BOOK: Freq: Once | Status: AC

## 2021-03-27 MED ORDER — ACETAMINOPHEN 650 MG RE SUPP
650.0000 mg | RECTAL | Status: DC | PRN
  Administered 2021-04-03: 18:00:00 650 mg via RECTAL
  Filled 2021-03-27: qty 1

## 2021-03-27 MED ORDER — PRAVASTATIN SODIUM 20 MG PO TABS
40.0000 mg | ORAL_TABLET | Freq: Every day | ORAL | Status: DC
  Administered 2021-03-28 – 2021-03-29 (×2): 40 mg via ORAL
  Filled 2021-03-27 (×3): qty 2

## 2021-03-27 MED ORDER — LIDOCAINE 5 % EX PTCH
1.0000 | MEDICATED_PATCH | CUTANEOUS | Status: DC
  Administered 2021-03-29 – 2021-04-05 (×8): 1 via TRANSDERMAL
  Filled 2021-03-27 (×11): qty 1

## 2021-03-27 MED ORDER — ZINC SULFATE 220 (50 ZN) MG PO CAPS
220.0000 mg | ORAL_CAPSULE | Freq: Every day | ORAL | Status: DC
  Administered 2021-03-27 – 2021-04-01 (×6): 220 mg via ORAL
  Filled 2021-03-27 (×5): qty 1

## 2021-03-27 MED ORDER — TORSEMIDE 20 MG PO TABS
20.0000 mg | ORAL_TABLET | Freq: Every day | ORAL | Status: DC
  Administered 2021-03-27 – 2021-03-30 (×4): 20 mg via ORAL
  Filled 2021-03-27 (×3): qty 1

## 2021-03-27 MED ORDER — TIOTROPIUM BROMIDE MONOHYDRATE 18 MCG IN CAPS
1.0000 | ORAL_CAPSULE | Freq: Every day | RESPIRATORY_TRACT | Status: DC
  Administered 2021-03-27 – 2021-04-01 (×6): 18 ug via RESPIRATORY_TRACT
  Filled 2021-03-27 (×2): qty 5

## 2021-03-27 MED ORDER — ASPIRIN EC 81 MG PO TBEC
81.0000 mg | DELAYED_RELEASE_TABLET | Freq: Every day | ORAL | Status: DC
  Administered 2021-03-27 – 2021-04-02 (×7): 81 mg via ORAL
  Filled 2021-03-27 (×7): qty 1

## 2021-03-27 MED ORDER — ENOXAPARIN SODIUM 40 MG/0.4ML IJ SOSY
40.0000 mg | PREFILLED_SYRINGE | INTRAMUSCULAR | Status: DC
  Administered 2021-03-27 – 2021-04-04 (×9): 40 mg via SUBCUTANEOUS
  Filled 2021-03-27 (×8): qty 0.4

## 2021-03-27 MED ORDER — GADOBUTROL 1 MMOL/ML IV SOLN
8.0000 mL | Freq: Once | INTRAVENOUS | Status: AC | PRN
  Administered 2021-03-27: 8 mL via INTRAVENOUS

## 2021-03-27 MED ORDER — VALACYCLOVIR HCL 500 MG PO TABS
500.0000 mg | ORAL_TABLET | Freq: Two times a day (BID) | ORAL | Status: DC
  Administered 2021-03-27 – 2021-04-01 (×10): 500 mg via ORAL
  Filled 2021-03-27 (×14): qty 1

## 2021-03-27 MED ORDER — OXYCODONE HCL 5 MG PO TABS: 5.0000 mg | ORAL_TABLET | Freq: Four times a day (QID) | ORAL | Status: DC | PRN

## 2021-03-27 MED ORDER — TRAZODONE HCL 50 MG PO TABS: 50.0000 mg | ORAL_TABLET | Freq: Every evening | ORAL | Status: DC | PRN

## 2021-03-27 MED ORDER — ASCORBIC ACID 500 MG PO TABS
500.0000 mg | ORAL_TABLET | Freq: Two times a day (BID) | ORAL | Status: DC
  Administered 2021-03-27 – 2021-04-01 (×10): 500 mg via ORAL
  Filled 2021-03-27 (×11): qty 1

## 2021-03-27 MED ORDER — FINASTERIDE 5 MG PO TABS
5.0000 mg | ORAL_TABLET | Freq: Every day | ORAL | Status: DC
  Administered 2021-03-27 – 2021-04-02 (×7): 5 mg via ORAL
  Filled 2021-03-27 (×7): qty 1

## 2021-03-27 MED ORDER — ADULT MULTIVITAMIN W/MINERALS CH
1.0000 | ORAL_TABLET | Freq: Every day | ORAL | Status: DC
  Administered 2021-03-27 – 2021-04-01 (×6): 1 via ORAL
  Filled 2021-03-27 (×6): qty 1

## 2021-03-27 MED ORDER — FERROUS SULFATE 325 (65 FE) MG PO TABS
325.0000 mg | ORAL_TABLET | Freq: Every day | ORAL | Status: DC
  Administered 2021-03-27 – 2021-04-01 (×6): 325 mg via ORAL
  Filled 2021-03-27 (×6): qty 1

## 2021-03-27 MED ORDER — FLUCONAZOLE 100 MG PO TABS
100.0000 mg | ORAL_TABLET | Freq: Every day | ORAL | Status: DC
  Administered 2021-03-27 – 2021-04-01 (×6): 100 mg via ORAL
  Filled 2021-03-27 (×6): qty 1

## 2021-03-27 MED ORDER — ACETAMINOPHEN 160 MG/5ML PO SOLN
650.0000 mg | ORAL | Status: DC | PRN
  Filled 2021-03-27: qty 20.3

## 2021-03-27 MED ORDER — NITROGLYCERIN 0.4 MG SL SUBL: 0.4000 mg | SUBLINGUAL_TABLET | SUBLINGUAL | Status: DC | PRN

## 2021-03-27 MED ORDER — SODIUM CHLORIDE 0.9 % IV BOLUS
500.0000 mL | Freq: Once | INTRAVENOUS | Status: AC
  Administered 2021-03-27: 500 mL via INTRAVENOUS

## 2021-03-27 MED ORDER — THIAMINE HCL 100 MG/ML IJ SOLN
500.0000 mg | Freq: Three times a day (TID) | INTRAVENOUS | Status: AC
  Administered 2021-03-27 – 2021-03-30 (×9): 500 mg via INTRAVENOUS
  Filled 2021-03-27 (×10): qty 5

## 2021-03-27 MED ORDER — ORAL CARE MOUTH RINSE
15.0000 mL | Freq: Two times a day (BID) | OROMUCOSAL | Status: DC
  Administered 2021-03-28 – 2021-04-05 (×15): 15 mL via OROMUCOSAL

## 2021-03-27 MED ORDER — FLUTICASONE PROPIONATE 50 MCG/ACT NA SUSP
2.0000 | Freq: Every day | NASAL | Status: DC
  Administered 2021-03-27 – 2021-04-01 (×6): 2 via NASAL
  Filled 2021-03-27: qty 16

## 2021-03-27 MED ORDER — ACETAMINOPHEN 325 MG PO TABS: 650.0000 mg | ORAL_TABLET | ORAL | Status: DC | PRN

## 2021-03-27 MED ORDER — MELATONIN 5 MG PO TABS
2.5000 mg | ORAL_TABLET | Freq: Every day | ORAL | Status: DC
  Administered 2021-03-28 – 2021-03-31 (×2): 2.5 mg via ORAL
  Filled 2021-03-27 (×6): qty 0.5

## 2021-03-27 MED ORDER — CHLORHEXIDINE GLUCONATE CLOTH 2 % EX PADS
6.0000 | MEDICATED_PAD | Freq: Every day | CUTANEOUS | Status: DC
  Administered 2021-03-28 – 2021-04-05 (×9): 6 via TOPICAL

## 2021-03-27 MED ORDER — ALBUTEROL SULFATE HFA 108 (90 BASE) MCG/ACT IN AERS: 2.0000 | INHALATION_SPRAY | Freq: Four times a day (QID) | RESPIRATORY_TRACT | Status: DC | PRN

## 2021-03-27 MED ORDER — SENNOSIDES-DOCUSATE SODIUM 8.6-50 MG PO TABS
1.0000 | ORAL_TABLET | Freq: Every evening | ORAL | Status: DC | PRN
  Administered 2021-03-30: 22:00:00 1 via ORAL
  Filled 2021-03-27: qty 1

## 2021-03-27 MED ORDER — ALBUTEROL SULFATE (2.5 MG/3ML) 0.083% IN NEBU
2.5000 mg | INHALATION_SOLUTION | Freq: Four times a day (QID) | RESPIRATORY_TRACT | Status: DC | PRN
  Administered 2021-04-01 – 2021-04-03 (×2): 2.5 mg via RESPIRATORY_TRACT
  Filled 2021-03-27 (×2): qty 3

## 2021-03-27 MED ORDER — ALFUZOSIN HCL ER 10 MG PO TB24
10.0000 mg | ORAL_TABLET | Freq: Every day | ORAL | Status: DC
  Administered 2021-03-27 – 2021-04-01 (×6): 10 mg via ORAL
  Filled 2021-03-27 (×9): qty 1

## 2021-03-27 MED ORDER — PANTOPRAZOLE SODIUM 20 MG PO TBEC
20.0000 mg | DELAYED_RELEASE_TABLET | Freq: Every day | ORAL | Status: DC
  Administered 2021-03-27 – 2021-04-01 (×6): 20 mg via ORAL
  Filled 2021-03-27 (×8): qty 1

## 2021-03-27 MED ORDER — CARVEDILOL 6.25 MG PO TABS
12.5000 mg | ORAL_TABLET | Freq: Two times a day (BID) | ORAL | Status: DC
  Administered 2021-03-27 – 2021-03-29 (×5): 12.5 mg via ORAL
  Filled 2021-03-27 (×5): qty 2

## 2021-03-27 MED ORDER — SODIUM CHLORIDE 0.9 % IV SOLN: INTRAVENOUS | Status: DC

## 2021-03-27 NOTE — H&P (Signed)
[place]   PATIENT NAME: Matthew Miles    MR#:  [id number]  DATE OF BIRTH:  1929-01-21  DATE OF ADMISSION:  03/26/2021  PRIMARY CARE PHYSICIAN: [name], MD   Patient is coming from: Home  REQUESTING/REFERRING PHYSICIAN: Hinda Kehr, MD  CHIEF COMPLAINT:   Chief Complaint  Patient presents with   Eye Pain    HISTORY OF PRESENT ILLNESS:  Yannis Broce. is a 87 y.o. Caucasian male with medical history significant for CHF, COPD, coronary artery disease, type 2 diabetes mellitus, emphysema and GERD, hypertension and dyslipidemia, who presented to the ER with acute onset of generalized weakness and malaise and inability to open the left eye.  He was noted to be unable to move his left eye immediately.  No blurred vision or diplopia.  No paresthesias or focal muscle weakness.  No headache or dizziness.  No vertigo or tinnitus.  He has been having visual hallucinations and has been trying to hold to things in the ER.  No dysuria, oliguria or hematuria or flank pain.  ED Course: When he came to the ER vital signs were within normal.  Labs revealed hyponatremia 127 with hypochloremia of 93.  Blood glucose was 184 and BUN 25.  Alk phos was significantly elevated at 517 with albumin of 2.8, AST of 76 and ALT of 116 with protein of 6.1 and lactic acid was 1.4.  CBC showed WBC of 10.7 and anemia close to baseline.  Imaging: Noncontrast head CT scan revealed chronic atrophic and ischemic changes without acute abnormality.  CT of the orbits showed no acute process or evidence of orbital cellulitis.  Brain MRI without contrast showed no acute abnormalities. He had an MRA of the head and neck which revealed the following: 1. The right vertebral artery is not well visualized for the entirety of its course, both extra cranially and intracranially, likely occluded although slow flow or severe stenosis can appear similar. Consider CTA head and neck for further evaluation. 2.  Fusiform dilatation of the right cavernous ICA. 3. 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 4. Focal stenosis in the left P1 and left A1. Narrowing is also noted at the origin of the right P3 branch. 5. The origin of the left vertebral artery and left common carotid artery are not well visualized, secondary to artifact.  The patient was given a bolus of 500 mL IV normal saline.  He will be admitted to a medical telemetry bed for further evaluation and management.    PAST MEDICAL HISTORY:   Past Medical History:  Diagnosis Date   Allergy    Arthritis    CHF (congestive heart failure) (HCC)    Chicken pox    Cholecystitis    Colon polyps    COPD (chronic obstructive pulmonary disease) ([place])    Coronary artery disease    Diabetes mellitus without complication (HCC)    Emphysema of lung (HCC)    GERD (gastroesophageal reflux disease)    Heart murmur    Hematemesis/vomiting blood 04/10/11   Hypercholesterolemia    Mild hypertension    Pancreatitis    Prostate cancer ([place])    Prostate cancer ([place])    radiation therapy    Smoker    Ulcer    Upper GI bleed 1980    PAST SURGICAL HISTORY:   Past Surgical History:  Procedure Laterality Date   APPENDECTOMY     CARDIAC CATHETERIZATION  May 2002 and  Feb 2013   [place]; no stents    CATARACT EXTRACTION     CHOLECYSTECTOMY     CIRCUMCISION     COLONOSCOPY     HEMORRHOID SURGERY     SP CHOLECYSTOMY     STOMACH SURGERY     bleeding ulcers, followed by Dr. [name]    SOCIAL HISTORY:   Social History   Tobacco Use   Smoking status: Former    Packs/day: 1.00    Years: 65.00    Pack years: 65.00    Types: Cigarettes    Quit date: 04/10/2011    Years since quitting: 9.9   Smokeless tobacco: Former    Types: Chew  Substance Use Topics   Alcohol use: Yes    Comment: 1 beer rarely    FAMILY HISTORY:   Family History  Problem Relation Age of Onset   Prostate cancer Father    Liver  cancer Brother    Prostate cancer Brother    Emphysema Sister        smoker    DRUG ALLERGIES:   Allergies  Allergen Reactions   Oxybutynin     Dry mouth and constant urination.   Sulfa Antibiotics     GI upset   Tradjenta [Linagliptin] Other (See Comments)    Hair loss    REVIEW OF SYSTEMS:   ROS As per history of present illness. All pertinent systems were reviewed above. Constitutional, HEENT, cardiovascular, respiratory, GI, GU, musculoskeletal, neuro, psychiatric, endocrine, integumentary and hematologic systems were reviewed and are otherwise negative/unremarkable except for positive findings mentioned above in the HPI.   MEDICATIONS AT HOME:   Prior to Admission medications   Medication Sig Start Date End Date Taking? Authorizing Provider  Accu-Chek FastClix Lancets MISC Inject 1 each into the skin in the morning and at bedtime. 02/18/21  Yes [name], MD  ACCU-CHEK GUIDE test strip TEST BLOOD SUGAR TWICE DAILY AS DIRECTED 02/22/21  Yes [name], MD  acetaminophen (TYLENOL) 325 MG tablet Take 650 mg by mouth every 6 (six) hours as needed.   Yes [provider]  alfuzosin (UROXATRAL) 10 MG 24 hr tablet Take 1 tablet (10 mg total) by mouth daily with breakfast. 08/27/19  Yes [name], MD  ascorbic acid (VITAMIN C) 500 MG tablet Take 500 mg by mouth 2 (two) times daily.   Yes [provider]  aspirin 81 MG tablet Take 81 mg by mouth daily.    Yes [provider]  blood glucose meter kit and supplies KIT Dispense based on patient and insurance preference. Use once daily as directed. Dx Code E11.9 02/15/21  Yes [name], MD  Calcium Citrate-Vitamin D (CALCIUM CITRATE + D PO) Take by mouth. 1200 mg of calcium and [phone] units of vitamin D daily   Yes [provider]  carvedilol (COREG) 6.25 MG tablet TAKE 1 TABLET TWICE DAILY Patient taking differently: Take 12.5 mg by mouth 2 (two) times daily with a  meal. TAKE 1 TABLET TWICE DAILY 11/09/20  Yes Gollan, Kathlene November, MD  collagenase (SANTYL) ointment Apply 1 application topically daily.   Yes [provider]  ferrous sulfate 325 (65 FE) MG EC tablet Take 325 mg by mouth daily.   Yes [provider]  finasteride (PROSCAR) 5 MG tablet Take 5 mg by mouth daily.   Yes [provider]  fluconazole (DIFLUCAN) 100 MG tablet Take 100 mg by mouth daily.   Yes [provider]  fluticasone (FLONASE) 50 MCG/ACT nasal spray Place 2 sprays into both nostrils daily.   Yes [provider]  insulin lispro (HUMALOG) 100 UNIT/ML injection Inject 1-4 Units into the skin 3 (three) times daily before meals. Per sliding scale   Yes [provider]  lidocaine (LIDODERM) 5 % Place 1 patch onto the skin daily. Remove & Discard patch within 12 hours or as directed by MD   Yes [provider]  melatonin 3 MG TABS tablet Take 3 mg by mouth at bedtime.   Yes [provider]  metFORMIN (GLUCOPHAGE-XR) 500 MG 24 hr tablet TAKE 2 TABLETS(1000 MG) BY MOUTH TWICE DAILY 09/21/20  Yes [name], MD  Multiple Vitamin (MULTIVITAMIN WITH MINERALS) TABS tablet Take 1 tablet by mouth daily.   Yes [provider]  mupirocin ointment (BACTROBAN) 2 % 1 application 2 (two) times daily as needed.   Yes [provider]  nitroGLYCERIN (NITROSTAT) 0.4 MG SL tablet Place 1 tablet (0.4 mg total) under the tongue every 5 (five) minutes as needed. 11/09/20  Yes Gollan, Kathlene November, MD  oxyCODONE (OXY IR/ROXICODONE) 5 MG immediate release tablet Take 5 mg by mouth every 6 (six) hours as needed for severe pain.   Yes [provider]  pantoprazole (PROTONIX) 20 MG tablet TAKE 1 TABLET EVERY DAY 12/04/20  Yes [name], MD  pravastatin (PRAVACHOL) 40 MG tablet Take 1 tablet (40 mg total) by mouth daily. 11/09/20  Yes Minna Merritts, MD  PROAIR HFA 108 [phone] Base) MCG/ACT inhaler INHALE 2 PUFFS INTO  THE LUNGS EVERY 6 HOURS AS NEEDED FOR WHEEZING OR SHORTNESS OF BREATH 10/03/16  Yes [name], MD  Tiotropium Bromide Monohydrate (SPIRIVA RESPIMAT) 2.5 MCG/ACT AERS Inhale 2 puffs into the lungs daily. 04/01/20  Yes [name], MD  torsemide (DEMADEX) 10 MG tablet TAKE 1 TABLET(10 MG) BY MOUTH TWICE DAILY AS NEEDED Patient taking differently: Take 20 mg by mouth daily. Per [name] 11/09/20  Yes Gollan, Kathlene November, MD  traZODone (DESYREL) 50 MG tablet Take 50 mg by mouth at bedtime as needed for sleep.   Yes [provider]  TRULICITY 1.5 YQ/8.2NO SOPN INJECT 1.5MG (1 PEN) SUBCUTANEOUSLY EVERY WEEK 12/15/20  Yes [name], MD  valACYclovir (VALTREX) 500 MG tablet Take 500 mg by mouth 2 (two) times daily.   Yes [provider]  zinc sulfate 220 (50 Zn) MG capsule Take 220 mg by mouth daily.   Yes [provider]  cephALEXin (KEFLEX) 500 MG capsule Take 1 capsule (500 mg total) by mouth 3 (three) times daily. Patient not taking: Reported on 03/26/2021 11/27/20   Paulette Blanch, MD  potassium chloride (KLOR-CON) 10 MEQ tablet Take 1 tablet (10 mEq total) by mouth every other day. Patient not taking: Reported on 03/26/2021 11/09/20   Minna Merritts, MD  predniSONE (DELTASONE) 20 MG tablet Take 2 tablets(82m) by mouth each morning for 7 days, then 1 tablet (241m by mouth each morning for the next 7 days. Patient not taking: Reported on 03/26/2021 03/10/21   [provider]      VITAL SIGNS:  Blood pressure (!) 151/59, pulse 90, temperature 98.1 F (36.7 C), temperature source Oral, resp. rate 17, height 5' 4"  (1.626 m), weight 76.7 kg, SpO2 93 %.  PHYSICAL EXAMINATION:  Physical Exam  GENERAL:  [age].o.-year-old Caucasian male patient lying in the bed with no acute distress.  EYES: Pupils equal, round, reactive to light  and accommodation. No scleral icterus. Extraocular muscles intact except for inability to have medial rotation of the  left eye.  HEENT: Head atraumatic, normocephalic. Oropharynx and nasopharynx clear.  NECK:  Supple, no jugular venous distention. No thyroid enlargement, no tenderness.  LUNGS: Normal breath sounds bilaterally, no wheezing, rales,rhonchi or crepitation. No use of accessory muscles of respiration.  CARDIOVASCULAR: Regular rate and rhythm, S1, S2 normal. No murmurs, rubs, or gallops.  ABDOMEN: Soft, nondistended, nontender. Bowel sounds present. No organomegaly or mass.  EXTREMITIES: No pedal edema, cyanosis, or clubbing.  NEUROLOGIC: Cranial nerves II through XII are intact except for suspected of 3rd nerve palsy with inability to medially rotate the left eye. Muscle strength 5/5 in all extremities. Sensation intact. Gait not checked.  PSYCHIATRIC: The patient is alert and oriented x 3.  Normal affect and good eye contact. SKIN: No obvious rash, lesion, or ulcer.   LABORATORY PANEL:   CBC Recent Labs  Lab 03/26/21 1641  WBC 10.7*  HGB 10.4*  HCT 31.1*  PLT 161   ------------------------------------------------------------------------------------------------------------------  Chemistries  Recent Labs  Lab 03/26/21 1641 03/26/21 1646  NA 127*  --   K 4.1  --   CL 93*  --   CO2 27  --   GLUCOSE 184*  --   BUN 25*  --   CREATININE 1.00  --   CALCIUM 8.3*  --   AST  --  76*  ALT  --  116*  ALKPHOS  --  517*  BILITOT  --  0.8   ------------------------------------------------------------------------------------------------------------------  Cardiac Enzymes No results for input(s): TROPONINI in the last 168 hours. ------------------------------------------------------------------------------------------------------------------  RADIOLOGY:  DG Chest 2 View  Result Date: 03/26/2021 CLINICAL DATA:  Altered mental status EXAM: CHEST - 2 VIEW COMPARISON:  03/04/2021 FINDINGS: Lung volumes are small. Small bilateral pleural effusions have developed since prior examination with  associated left basilar compressive atelectasis. No pneumothorax. Cardiac size within normal limits. Pulmonary vascularity is normal. No acute bone abnormality. IMPRESSION: Interval development of small bilateral pleural effusions with associated left basilar atelectasis. Electronically Signed   By: Fidela Salisbury M.D.   On: 03/26/2021 23:53   CT Head Wo Contrast  Result Date: 03/26/2021 CLINICAL DATA:  Left eye droop EXAM: CT HEAD WITHOUT CONTRAST TECHNIQUE: Contiguous axial images were obtained from the base of the skull through the vertex without intravenous contrast. COMPARISON:  03/26/2021 FINDINGS: Brain: Mild atrophic changes and chronic white matter ischemic changes are identified. A few small lacunar infarcts are noted within the basal ganglia on the right stable in appearance from the prior exam. No acute hemorrhage or acute infarction is seen. Vascular: No hyperdense vessel or unexpected calcification. Skull: Normal. Negative for fracture or focal lesion. Sinuses/Orbits: No acute finding. Other: None. IMPRESSION: Chronic atrophic and ischemic changes without acute abnormality. Electronically Signed   By: Inez Catalina M.D.   On: 03/26/2021 21:21   MR ANGIO HEAD WO CONTRAST  Result Date: 03/27/2021 CLINICAL DATA:  Neuro deficit, stroke suspected, left third nerve palsy, concern for aneurysm EXAM: MRA NECK WITHOUT AND WITH CONTRAST MRA HEAD WITHOUT CONTRAST TECHNIQUE: Multiplanar and multiecho pulse sequences of the neck were obtained without and with intravenous contrast. Angiographic images of the neck were obtained using MRA technique without and with intravenous contrast; Angiographic images of the Circle of Willis were obtained using MRA technique without intravenous contrast. CONTRAST:  55m GADAVIST GADOBUTROL 1 MMOL/ML IV SOLN COMPARISON:  None. FINDINGS: MRA NECK FINDINGS Three-vessel aortic arch. The proximal  left common carotid is not well evaluated due to artifact. No evidence of aortic  aneurysm or dissection. No evidence of hemodynamically significant stenosis (greater than 50%), dissection, or occlusion in the bilateral common and internal carotid arteries. The right vertebral artery is not well visualized for the entirety of its course, which may indicate slow flow or stenosis. The origin of the left vertebral artery is not well visualized, secondary to artifact. The remainder of the left vertebral artery is patent, without significant stenosis (greater than 50%), dissection, or occlusion. MRA HEAD FINDINGS Both internal carotid arteries are patent to the termini, without stenosis. Somewhat fusiform dilatation of the right cavernous ICA (series 9, image 95). Focal stenosis of the origin of the left A1 (series 9, image 107). Normal right A1. Anterior cerebral arteries are patent to their distal aspects. No M1 stenosis or occlusion. Normal MCA bifurcations. Distal MCA branches perfused and relatively symmetric. The right vertebral artery is not visualized. The left vertebral artery is patent to the vertebrobasilar junction. The basilar artery demonstrates focal loss of signal (series 9, image 83), likely severe focal stenosis or occlusion. Focal narrowing is also noted in the right P1 segment, with a patent right posterior communicating artery. The left P1 is not visualized. Patent left posterior communicating artery with fetal origin of the left PCA. Focal narrowing at the origin of the right P3 branch (series 9, image 131). PCAs are grossly patent to their distal aspects. IMPRESSION: 1. The right vertebral artery is not well visualized for the entirety of its course, both extra cranially and intracranially, likely occluded although slow flow or severe stenosis can appear similar. Consider CTA head and neck for further evaluation. 2. Fusiform dilatation of the right cavernous ICA. 3. 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 4. Focal stenosis in the left P1  and left A1. Narrowing is also noted at the origin of the right P3 branch. 5. The origin of the left vertebral artery and left common carotid artery are not well visualized, secondary to artifact. Electronically Signed   By: Merilyn Baba M.D.   On: 03/27/2021 02:19   MR Angiogram Neck W or Wo Contrast  Result Date: 03/27/2021 CLINICAL DATA:  Neuro deficit, stroke suspected, left third nerve palsy, concern for aneurysm EXAM: MRA NECK WITHOUT AND WITH CONTRAST MRA HEAD WITHOUT CONTRAST TECHNIQUE: Multiplanar and multiecho pulse sequences of the neck were obtained without and with intravenous contrast. Angiographic images of the neck were obtained using MRA technique without and with intravenous contrast; Angiographic images of the Circle of Willis were obtained using MRA technique without intravenous contrast. CONTRAST:  58m GADAVIST GADOBUTROL 1 MMOL/ML IV SOLN COMPARISON:  None. FINDINGS: MRA NECK FINDINGS Three-vessel aortic arch. The proximal left common carotid is not well evaluated due to artifact. No evidence of aortic aneurysm or dissection. No evidence of hemodynamically significant stenosis (greater than 50%), dissection, or occlusion in the bilateral common and internal carotid arteries. The right vertebral artery is not well visualized for the entirety of its course, which may indicate slow flow or stenosis. The origin of the left vertebral artery is not well visualized, secondary to artifact. The remainder of the left vertebral artery is patent, without significant stenosis (greater than 50%), dissection, or occlusion. MRA HEAD FINDINGS Both internal carotid arteries are patent to the termini, without stenosis. Somewhat fusiform dilatation of the right cavernous ICA (series 9, image 95). Focal stenosis of the origin of the left A1 (series 9, image 107).  Normal right A1. Anterior cerebral arteries are patent to their distal aspects. No M1 stenosis or occlusion. Normal MCA bifurcations. Distal MCA  branches perfused and relatively symmetric. The right vertebral artery is not visualized. The left vertebral artery is patent to the vertebrobasilar junction. The basilar artery demonstrates focal loss of signal (series 9, image 83), likely severe focal stenosis or occlusion. Focal narrowing is also noted in the right P1 segment, with a patent right posterior communicating artery. The left P1 is not visualized. Patent left posterior communicating artery with fetal origin of the left PCA. Focal narrowing at the origin of the right P3 branch (series 9, image 131). PCAs are grossly patent to their distal aspects. IMPRESSION: 1. The right vertebral artery is not well visualized for the entirety of its course, both extra cranially and intracranially, likely occluded although slow flow or severe stenosis can appear similar. Consider CTA head and neck for further evaluation. 2. Fusiform dilatation of the right cavernous ICA. 3. 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 4. Focal stenosis in the left P1 and left A1. Narrowing is also noted at the origin of the right P3 branch. 5. The origin of the left vertebral artery and left common carotid artery are not well visualized, secondary to artifact. Electronically Signed   By: Merilyn Baba M.D.   On: 03/27/2021 02:19   MR BRAIN WO CONTRAST  Result Date: 03/26/2021 CLINICAL DATA:  Neuro deficit, stroke suspected, loss of vision in left eye EXAM: MRI HEAD WITHOUT CONTRAST TECHNIQUE: Multiplanar, multiecho pulse sequences of the brain and surrounding structures were obtained without intravenous contrast. COMPARISON:  No prior MRI, correlation is made with CT head 03/26/2021 FINDINGS: Brain: No restricted diffusion to suggest acute or subacute infarct. No acute hemorrhage, mass, mass effect, or midline shift. Lacunar infarcts in the right basal ganglia. T2 hyperintense signal in the periventricular white matter, likely the sequela of  chronic small vessel ischemic disease. No hydrocephalus or extra-axial collection. Focus of hemosiderin deposition in the right medial temporal lobe, likely sequela of prior hypertensive microhemorrhage. Vascular: Normal flow voids. Skull and upper cervical spine: Normal marrow signal. Sinuses/Orbits: Negative.  Status post right lens replacement. Other: Fluid in the bilateral mastoid air cells. IMPRESSION: No acute intracranial process. Electronically Signed   By: Merilyn Baba M.D.   On: 03/26/2021 23:50   CT Orbits W Contrast  Result Date: 03/26/2021 CLINICAL DATA:  Orbital cellulitis suspected, left eyelid droop EXAM: CT ORBITS WITH CONTRAST TECHNIQUE: Multidetector CT images was performed according to the standard protocol following intravenous contrast administration. CONTRAST:  54m OMNIPAQUE IOHEXOL 300 MG/ML  SOLN COMPARISON:  No prior CT orbits, correlation is made with 03/07/2021 CT head FINDINGS: Orbits: No orbital mass or evidence of inflammation. Normal appearance of the globes, optic nerve-sheath complexes, extraocular muscles, orbital fat and lacrimal glands. Status post right lens replacement. Visible paranasal sinuses: Minimal mucosal thickening, grossly clear. Soft tissues: Normal. Osseous: No fracture or aggressive lesion. Limited intracranial: No acute or significant finding. IMPRESSION: No acute process in the orbits.  No evidence of orbital cellulitis. Electronically Signed   By: AMerilyn BabaM.D.   On: 03/26/2021 19:28      IMPRESSION AND PLAN:  Principal Problem:   Third nerve palsy of left eye  1.  Left eye 3rd nerve palsy with left eye ptosis and failure medial rotation. - The patient will be admitted to a medically monitored bed. - We will follow neurochecks every 4 hours  for 24 hours. - Neurology consult to be obtained. - We will continue the patient on aspirin and statin therapy. - Fasting lipids will be obtained in a.m. - Notify Dr. Cheral Marker about the patient. -  Ophthalmology consult will be obtained. - Dr. George Ina was notified and is aware about the patient.  2.  Hyponatremia. - The patient will be hydrated with IV normal saline. - We will follow sodium levels.  3.  Altered mental status with mild delirium and visual hallucinations. - We will place the patient on as needed IM Haldol.  4. BPH. - We will Continue Uroxatrol and Proscar.  5.  Type 2 diabetes mellitus. - We will place the patient on supplement coverage with NovoLog and hold metformin.  6.  Dyslipidemia. - We will continue statin therapy and check fasting lipids.  DVT prophylaxis: Lovenox. Code Status: full code. Family Communication:  The plan of care was discussed in details with the patient (and family). I answered all questions. The patient agreed to proceed with the above mentioned plan. Further management will depend upon hospital course. Disposition Plan: Back to previous home environment Consults called: Neurology and ophthalmology.   All the records are reviewed and case discussed with ED provider.  Status is: Inpatient   Remains inpatient appropriate because:Ongoing diagnostic testing needed not appropriate for outpatient work up, Unsafe d/c plan, IV treatments appropriate due to intensity of illness or inability to take PO, and Inpatient level of care appropriate due to severity of illness   Dispo: The patient is from: Home              Anticipated d/c is to: Home              Patient currently is not medically stable to d/c.              Difficult to place patient: No     Christel Mormon M.D on 03/27/2021 at 3:44 AM  [facility]   From 7 PM-7 AM, contact night-coverage [email or web address]  CC: Primary care physician; [name], MD

## 2021-03-27 NOTE — Evaluation (Signed)
Physical Therapy Evaluation Patient Details Name: Matthew Miles. MRN: [id number] DOB: 08/10/1928 Today's Date: 03/27/2021  History of Present Illness  Pt is a 87 y/o M admitted on 03/26/21 with c/c of acute onset of generalized weakness & malaise & inability to open the L eye. CT & brain MRI showed no acute abnormalities. 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 PMH: CHF, COPD, CAD, DM2, emphyseam, GERD, HTN, dyslipidemia, prostate CA  Clinical Impression  Pt seen for PT evaluation with co-tx with OT. Pt received sitting on EOB with pt demonstrating impaired cognition throughout session. Pt is able to follow simple commands with extra time & requires max cuing for safe hand placement & safety with overall mobility. Pt performs multiple sit<>stand with +2 assist & cuing for anterior pelvic shift. Pt progresses to taking ~3 very small steps forwards & backwards with +2 assist. Pt noted to have urine soaked brief despite foley catheter in place & stood while PT/OT assisting with changing brief. Throughout all standing trials pt has frequent, spontaneous BLE knee buckling & requires MAX assist to correct & prevent fall. At this time, recommend pt d/c to STR as pt is unsafe with mobility & extremely high fall risk.        Recommendations for follow up therapy are one component of a multi-disciplinary discharge planning process, led by the attending physician.  Recommendations may be updated based on patient status, additional functional criteria and insurance authorization.  Follow Up Recommendations Skilled nursing-short term rehab (<3 hours/day)    Assistance Recommended at Discharge Frequent or constant Supervision/Assistance  Functional Status Assessment Patient has had a recent decline in their functional status and demonstrates the ability to make significant improvements in function in a reasonable and predictable amount of time.  Equipment  Recommendations  None recommended by PT    Recommendations for Other Services       Precautions / Restrictions Precautions Precautions: Fall Restrictions Weight Bearing Restrictions: No      Mobility  Bed Mobility Overal bed mobility: Needs Assistance Bed Mobility: Supine to Sit;Sit to Supine     Supine to sit: Mod assist;HOB elevated Sit to supine: Mod assist        Transfers Overall transfer level: Needs assistance Equipment used: Rolling walker (2 wheels) Transfers: Sit to/from Stand Sit to Stand: Max assist;+2 physical assistance;From elevated surface          Lateral/Scoot Transfers: Max assist General transfer comment: Max cuing for proper, safe hand placement on RW.    Ambulation/Gait Ambulation/Gait assistance: Max assist;+2 physical assistance;+2 safety/equipment Gait Distance (Feet): 1 Feet Assistive device: Rolling walker (2 wheels) Gait Pattern/deviations: Decreased step length - right;Decreased step length - left;Decreased stride length;Trunk flexed Gait velocity: decreased     General Gait Details: Decreased foot clearance BLE  Stairs            Wheelchair Mobility    Modified Rankin (Stroke Patients Only)       Balance Overall balance assessment: Needs assistance Sitting-balance support: Feet supported;Bilateral upper extremity supported Sitting balance-Leahy Scale: Fair     Standing balance support: Bilateral upper extremity supported;Reliant on assistive device for balance Standing balance-Leahy Scale: Zero                               Pertinent Vitals/Pain Pain Assessment: No/denies pain    Home Living Family/patient expects to be  discharged to:: Skilled nursing facility                        Prior Function Prior Level of Function : Needs assist;Patient poor historian/Family not available                     Hand Dominance        Extremity/Trunk Assessment   Upper Extremity  Assessment Upper Extremity Assessment: Generalized weakness    Lower Extremity Assessment Lower Extremity Assessment: Generalized weakness (frequent & spontaneous BLE knee buckling throughout session!)    Cervical / Trunk Assessment Cervical / Trunk Assessment: Kyphotic  Communication   Communication: No difficulties  Cognition Arousal/Alertness: Awake/alert Behavior During Therapy: Impulsive Overall Cognitive Status: No family/caregiver present to determine baseline cognitive functioning Area of Impairment: Orientation;Following commands;Safety/judgement                 Orientation Level: Disoriented to;Situation;Place     Following Commands: Follows one step commands inconsistently Safety/Judgement: Decreased awareness of safety;Decreased awareness of deficits     General Comments: States name and month/year accurately, states location as [place]        General Comments General comments (skin integrity, edema, etc.): PT/OT provide total assist for changing into a clean brief. Pt on 2L/min via nasal cannula, O2 reading desaturation at times but pt frequently fidgeting with piece on finger.    Exercises Other Exercises Other Exercises: Pt educated re; OT role, DME recs, d/c recs, falls prevention Other Exercises: LBD, toileting, sup<>sit, sit<>stand   Assessment/Plan    PT Assessment Patient needs continued PT services  PT Problem List Decreased strength;Decreased coordination;Cardiopulmonary status limiting activity;Decreased activity tolerance;Decreased cognition;Decreased balance;Decreased knowledge of use of DME;Decreased mobility;Decreased safety awareness       PT Treatment Interventions DME instruction;Therapeutic exercise;Gait training;Balance training;Stair training;Neuromuscular re-education;Manual techniques;Modalities;Functional mobility training;Cognitive remediation;Therapeutic activities;Patient/family education    PT Goals (Current goals can be found  in the Care Plan section)  Acute Rehab PT Goals PT Goal Formulation: Patient unable to participate in goal setting Time For Goal Achievement: 04/10/21 Potential to Achieve Goals: Fair    Frequency Min 2X/week   Barriers to discharge        Co-evaluation PT/OT/SLP Co-Evaluation/Treatment: Yes Reason for Co-Treatment: Complexity of the patient's impairments (multi-system involvement);For patient/therapist safety;To address functional/ADL transfers PT goals addressed during session: Mobility/safety with mobility;Proper use of DME;Balance OT goals addressed during session: ADL's and self-care       AM-PAC PT "6 Clicks" Mobility  Outcome Measure Help needed turning from your back to your side while in a flat bed without using bedrails?: A Lot Help needed moving from lying on your back to sitting on the side of a flat bed without using bedrails?: A Lot Help needed moving to and from a bed to a chair (including a wheelchair)?: Total Help needed standing up from a chair using your arms (e.g., wheelchair or bedside chair)?: Total Help needed to walk in hospital room?: Total Help needed climbing 3-5 steps with a railing? : Total 6 Click Score: 8    End of Session Equipment Utilized During Treatment: Gait belt;Oxygen Activity Tolerance: Patient tolerated treatment well Patient left: in bed (nursing supervising - pt in hospital bed) Nurse Communication: Mobility status PT Visit Diagnosis: Unsteadiness on feet (R26.81);Difficulty in walking, not elsewhere classified (R26.2);Muscle weakness (generalized) (M62.81)    Time: 3818-2993 PT Time Calculation (min) (ACUTE ONLY): 15 min   Charges:   PT Evaluation $PT  Eval High Complexity: 1 High          Lavone Nian, PT, DPT 03/27/21, 1:32 PM   Waunita Schooner 03/27/2021, 1:29 PM

## 2021-03-27 NOTE — ED Provider Notes (Signed)
----------------------------------------- °  12:18 AM on 03/27/2021 -----------------------------------------  Assuming care from Dr. Cinda Quest.  In short, Matthew Miles. is a 87 y.o. male with a chief complaint of left eye vision loss and associated issues.  Refer to the original H&P for additional details.  I evaluated the patient at bedside with Dr. Cinda Quest.  I verified with him the exam which is notable for left-sided ptosis and the patient's inability to look medially with the left eye past midline but he is able to look laterally.  The patient also reports a substantial loss of visual acuity and states that he can only perceive light and dark.  He is also showing signs of a least a degree of delirium with some waxing and waning mental status and picking at objects that are not there.  Interestingly he is aware that he was doing this earlier this evening.  MR brain does not show sign of acute CVA and in fact identifies no acute abnormalities.  I called and spoke with phone with Dr. George Ina with ophthalmology.  He confirmed that this sounds like a classic 3rd nerve palsy and should be considered an aneurysm until proven otherwise.  He agreed with the plan for MRA head and neck, particularly given that the patient already had a CT scan with IV contrast which makes the patient in eligible for a CTA head and neck.  The patient is in no distress and still reporting no pain.  He will likely benefit from admission, but given the possibility of an aneurysm that may require coiling, I will obtain the MRA studies to determine whether or not he needs to be transferred to a different facility.   ----------------------------------------- 2:37 AM on 03/27/2021 -----------------------------------------  No obvious explanation on MRA head/neck for the patient's cranial nerve symptoms.  There was an area of possible obstruction or stenosis in the right vertebral artery but this does not explain the left-sided  ptosis and 3rd nerve palsy.  There is no obvious aneurysm.  I am consulting the hospitalist for admission for neurology consult and/or ophthalmology consult, although Dr. George Ina mention that there is likely no role for ophthalmology but he understands that he may be consulted.  I am also ordering a small 500 mL normal saline fluid bolus given his hyponatremia.   ----------------------------------------- 2:49 AM on 03/27/2021 -----------------------------------------  Discussed with Dr. Sidney Ace who will admit.  Updated patient.  Nursing moved the patient to a hospital bed for comfort.   Hinda Kehr, MD 03/27/21 [phone]

## 2021-03-27 NOTE — Evaluation (Signed)
Occupational Therapy Evaluation Patient Details Name: Matthew Miles. MRN: [id number] DOB: 02-Mar-1929 Today's Date: 03/27/2021   History of Present Illness Matthew Miles. is a 87 y.o. Caucasian male with medical history significant for CHF, COPD, coronary artery disease, type 2 diabetes mellitus, emphysema and GERD, hypertension and dyslipidemia, who presented to the ER with acute onset of generalized weakness and malaise and inability to open the left eye.   Clinical Impression   Mr Gartman was seen for OT evaluation this date. Prior to hospital admission, pt was at [facility], por historian unable to state accurate PLOF. Pt presents to acute OT demonstrating impaired ADL performance and functional mobility 2/2 decreased activity tolerance and functional strength/ROM/balance deficits. Pt currently requires MAX A x2 + RW for ADL t/f and brief change in standing, finished at bed level 2/2 poor standing tolerance - RN notified pt legs giving out in standing. MAX A x2 perihygiene in standing. Pt would benefit from skilled OT to address noted impairments and functional limitations (see below for any additional details) in order to maximize safety and independence while minimizing falls risk and caregiver burden. Upon hospital discharge, recommend STR to maximize pt safety and return to PLOF.       Recommendations for follow up therapy are one component of a multi-disciplinary discharge planning process, led by the attending physician.  Recommendations may be updated based on patient status, additional functional criteria and insurance authorization.   Follow Up Recommendations  Skilled nursing-short term rehab (<3 hours/day)    Assistance Recommended at Discharge Frequent or constant Supervision/Assistance  Functional Status Assessment  Patient has had a recent decline in their functional status and demonstrates the ability to make significant improvements in function in a reasonable and  predictable amount of time.  Equipment Recommendations  Other (comment) (defer to next venue of care)    Recommendations for Other Services       Precautions / Restrictions Precautions Precautions: Fall Restrictions Weight Bearing Restrictions: No      Mobility Bed Mobility Overal bed mobility: Needs Assistance Bed Mobility: Supine to Sit;Sit to Supine     Supine to sit: Mod assist;HOB elevated Sit to supine: Mod assist        Transfers Overall transfer level: Needs assistance Equipment used: Rolling walker (2 wheels) Transfers: Sit to/from Stand;Bed to chair/wheelchair/BSC Sit to Stand: Max assist;+2 physical assistance          Lateral/Scoot Transfers: Max assist        Balance Overall balance assessment: Needs assistance Sitting-balance support: Feet supported;Bilateral upper extremity supported Sitting balance-Leahy Scale: Fair     Standing balance support: Bilateral upper extremity supported;Reliant on assistive device for balance Standing balance-Leahy Scale: Zero                             ADL either performed or assessed with clinical judgement   ADL Overall ADL's : Needs assistance/impaired                                       General ADL Comments: MAX A x2 + RW for ADL t/f and brief change in standing, finished at bed level 2/2 poor standing tolerance. MAX A x2 perihygiene in standing.      Pertinent Vitals/Pain Pain Assessment: No/denies pain     Hand Dominance     Extremity/Trunk Assessment Upper  Extremity Assessment Upper Extremity Assessment: Generalized weakness   Lower Extremity Assessment Lower Extremity Assessment: Generalized weakness       Communication Communication Communication: No difficulties   Cognition Arousal/Alertness: Awake/alert Behavior During Therapy: Impulsive Overall Cognitive Status: No family/caregiver present to determine baseline cognitive functioning Area of  Impairment: Orientation;Following commands;Safety/judgement                 Orientation Level: Disoriented to;Situation;Place     Following Commands: Follows one step commands inconsistently Safety/Judgement: Decreased awareness of safety;Decreased awareness of deficits     General Comments: States name and month/year accurately, states location as [place]     General Comments       Exercises Exercises: Other exercises Other Exercises Other Exercises: Pt educated re; OT role, DME recs, d/c recs, falls prevention Other Exercises: LBD, toileting, sup<>sit, sit<>stand   Shoulder Instructions      Home Living Family/patient expects to be discharged to:: Skilled nursing facility                                        Prior Functioning/Environment Prior Level of Function : Needs assist;Patient poor historian/Family not available                        OT Problem List: Decreased strength;Decreased range of motion;Decreased activity tolerance;Impaired balance (sitting and/or standing);Decreased safety awareness      OT Treatment/Interventions: Self-care/ADL training;Therapeutic exercise;Energy conservation;DME and/or AE instruction;Therapeutic activities;Patient/family education;Balance training    OT Goals(Current goals can be found in the care plan section) Acute Rehab OT Goals Patient Stated Goal: to feel better OT Goal Formulation: With patient Time For Goal Achievement: 04/10/21 Potential to Achieve Goals: Fair ADL Goals Pt Will Perform Grooming: with min guard assist;sitting Pt Will Perform Lower Body Dressing: with mod assist;sit to/from stand Pt Will Transfer to Toilet: with min assist;stand pivot transfer;bedside commode (c LRAD PRN)  OT Frequency: Min 2X/week   Barriers to D/C:            Co-evaluation PT/OT/SLP Co-Evaluation/Treatment: Yes Reason for Co-Treatment: For patient/therapist safety;To address functional/ADL  transfers PT goals addressed during session: Mobility/safety with mobility;Balance OT goals addressed during session: ADL's and self-care      AM-PAC OT "6 Clicks" Daily Activity     Outcome Measure Help from another person eating meals?: A Little Help from another person taking care of personal grooming?: A Lot Help from another person toileting, which includes using toliet, bedpan, or urinal?: A Lot Help from another person bathing (including washing, rinsing, drying)?: A Lot Help from another person to put on and taking off regular upper body clothing?: A Little Help from another person to put on and taking off regular lower body clothing?: A Lot 6 Click Score: 14   End of Session Equipment Utilized During Treatment: Gait belt;Rolling walker (2 wheels) Nurse Communication: Mobility status  Activity Tolerance: Patient tolerated treatment well Patient left: in bed;with call bell/phone within reach;with bed alarm set;with nursing/sitter in room  OT Visit Diagnosis: Other abnormalities of gait and mobility (R26.89)                Time: 7741-2878 OT Time Calculation (min): 21 min Charges:  OT General Charges $OT Visit: 1 Visit OT Evaluation $OT Eval Low Complexity: 1 Low OT Treatments $Self Care/Home Management : 8-22 mins  Dessie Coma, M.S. OTR/L  03/27/21,  12:49 PM  ascom [phone]

## 2021-03-27 NOTE — ED Notes (Addendum)
Notified attending MD neuro assessment had changed. Daughter was at bedside now and noticed his mentation has changed. Pt more agitated and has worsened mentation compared to earlier in the afternoon per daughter. Right eye almost completely closed now and pt was not able to follow my finger or identify how many fingers I was holding up compared to earlier assessment around 1600. He was A&O for his name and where he was but could not give his birthday or what month it was. No weakness in extremities or facial weakness. No change in speech.

## 2021-03-27 NOTE — Progress Notes (Addendum)
Progress Note    Matthew Miles.  WIO:[id number] DOB: [birth year] DOA: 03/26/2021 PCP: [name], MD   Brief Narrative:  Matthew Miles. is a 87 y.o. Caucasian male with medical history significant for CHF, COPD, coronary artery disease, type 2 diabetes mellitus, emphysema and GERD, hypertension and dyslipidemia, prostate cancer undergoing evaluation at [facility] who presented to the ER with acute onset of generalized weakness and malaise and inability to open the left eye. Neuro and optho following - appreciate insight/recs.  Assessment & Plan:  Altered mental status with mild delirium and visual hallucinations secondary to above - Appears to be back to baseline this morning, continue to monitor closely [facility] delirium from previous prolonged hospitalization/SNF placement - problem sleeping at [facility] last week - then began (melatonin and trazodone - "if anything made him worse" per Daughter).  Left Internuclear Ophthalmoplegia, POA - Likely secondary to vertebrobasilar insufficiency on MRA without overt aneurysm or stroke - Ophthalmology and neurology following, appreciate insight and recommendations - Continue conservative management  Dehydration, hypovolemic hyponatremia. - Continue IV fluids, follow repeat labs  BPH. - Continue Uroxatrol and Proscar.  Non insulin-dependent diabetes type 2  - Continue sliding scale insulin, hold home metformin  Sacral decubitus ulcer, POA See nursing/WOC documentation  Dyslipidemia. - We will continue statin therapy and check fasting lipids.  DVT prophylaxis: Lovenox. Code Status: full code. Family Communication: Daughter updated over phone  Status is: inpt  Dispo: The patient is from: home              Anticipated d/c is to: home              Anticipated d/c date is: 24-48h              Patient currently NOT medically stable for discharge  Consultants:  Neuro, optho  Procedures:  None  Antimicrobials:  None    Subjective: No acute issues/events overnight  Objective: Vitals:   03/27/21 0032 03/27/21 0331 03/27/21 0505 03/27/21 0646  BP: (!) 136/55 (!) 151/59 (!) 144/74 (!) 144/56  Pulse: 88 90 89 88  Resp: 17 17 17 17   Temp:      TempSrc:      SpO2: 92% 93% 92% 92%  Weight:      Height:        Intake/Output Summary (Last 24 hours) at 03/27/2021 0805 Last data filed at 03/27/2021 0500 Gross per 24 hour  Intake 500 ml  Output 1250 ml  Net -750 ml   Filed Weights   03/26/21 1645  Weight: 76.7 kg    Examination:  General exam: Appears calm and comfortable  Respiratory system: Clear to auscultation. Respiratory effort normal. Cardiovascular system: S1 & S2 heard, RRR. No JVD, murmurs, rubs, gallops or clicks. No pedal edema. Gastrointestinal system: Abdomen is nondistended, soft and nontender. No organomegaly or masses felt. Normal bowel sounds heard. Central nervous system: Alert and oriented to self only, nerve palsy/limited vision as above Extremities: Symmetric 5 x 5 power. Skin: No rashes, lesions; sacral decubitus ulcer (see above) Psychiatry: Unable to assess given mental status  Data Reviewed: I have personally reviewed following labs and imaging studies  CBC: Recent Labs  Lab 03/26/21 1641  WBC 10.7*  NEUTROABS 8.7*  HGB 10.4*  HCT 31.1*  MCV 95.4  PLT 426   Basic Metabolic Panel: Recent Labs  Lab 03/26/21 1641  NA 127*  K 4.1  CL 93*  CO2 27  GLUCOSE 184*  BUN 25*  CREATININE  1.00  CALCIUM 8.3*   GFR: Estimated Creatinine Clearance: 46 mL/min (by C-G formula based on SCr of 1 mg/dL). Liver Function Tests: Recent Labs  Lab 03/26/21 1646  AST 76*  ALT 116*  ALKPHOS 517*  BILITOT 0.8  PROT 6.1*  ALBUMIN 2.8*   No results for input(s): LIPASE, AMYLASE in the last 168 hours. No results for input(s): AMMONIA in the last 168 hours. Coagulation Profile: No results for input(s): INR, PROTIME in the last 168 hours. Cardiac Enzymes: No results  for input(s): CKTOTAL, CKMB, CKMBINDEX, TROPONINI in the last 168 hours. BNP (last 3 results) No results for input(s): PROBNP in the last 8760 hours. HbA1C: No results for input(s): HGBA1C in the last 72 hours. CBG: No results for input(s): GLUCAP in the last 168 hours. Lipid Profile: Recent Labs    03/27/21 0517  CHOL 132  HDL 24*  LDLCALC 74  TRIG 168*  CHOLHDL 5.5   Thyroid Function Tests: No results for input(s): TSH, T4TOTAL, FREET4, T3FREE, THYROIDAB in the last 72 hours. Anemia Panel: No results for input(s): VITAMINB12, FOLATE, FERRITIN, TIBC, IRON, RETICCTPCT in the last 72 hours. Sepsis Labs: Recent Labs  Lab 03/26/21 1650  LATICACIDVEN 1.4    Recent Results (from the past 240 hour(s))  Culture, blood (routine x 2)     Status: None (Preliminary result)   Collection Time: 03/26/21  6:15 PM   Specimen: BLOOD  Result Value Ref Range Status   Specimen Description BLOOD LEFT ANTECUBITAL  Final   Special Requests   Final    BOTTLES DRAWN AEROBIC AND ANAEROBIC Blood Culture adequate volume   Culture   Final    NO GROWTH < 12 HOURS Performed at [facility], [address], [place], [place] [address]    Report Status PENDING  Incomplete  Culture, blood (routine x 2)     Status: None (Preliminary result)   Collection Time: 03/26/21  6:22 PM   Specimen: BLOOD  Result Value Ref Range Status   Specimen Description BLOOD BLOOD LEFT HAND  Final   Special Requests   Final    BOTTLES DRAWN AEROBIC AND ANAEROBIC Blood Culture adequate volume   Culture   Final    NO GROWTH < 12 HOURS Performed at [facility], [address], [place], [place] [address]    Report Status PENDING  Incomplete  Resp Panel by RT-PCR (Flu A&B, Covid) Nasopharyngeal Swab     Status: None   Collection Time: 03/27/21 12:02 AM   Specimen: Nasopharyngeal Swab; Nasopharyngeal(NP) swabs in vial transport medium  Result Value Ref Range Status   SARS Coronavirus 2 by RT PCR  NEGATIVE NEGATIVE Final    Comment: (NOTE) SARS-CoV-2 target nucleic acids are NOT DETECTED.  The SARS-CoV-2 RNA is generally detectable in upper respiratory specimens during the acute phase of infection. The lowest concentration of SARS-CoV-2 viral copies this assay can detect is 138 copies/mL. A negative result does not preclude SARS-Cov-2 infection and should not be used as the sole basis for treatment or other patient management decisions. A negative result may occur with  improper specimen collection/handling, submission of specimen other than nasopharyngeal swab, presence of viral mutation(s) within the areas targeted by this assay, and inadequate number of viral copies(<138 copies/mL). A negative result must be combined with clinical observations, patient history, and epidemiological information. The expected result is Negative.  Fact Sheet for Patients:  EntrepreneurPulse.com.au  Fact Sheet for Healthcare Providers:  IncredibleEmployment.be  This test is no t  yet approved or cleared by the [place] and  has been authorized for detection and/or diagnosis of SARS-CoV-2 by FDA under an Emergency Use Authorization (EUA). This EUA will remain  in effect (meaning this test can be used) for the duration of the COVID-19 declaration under Section 564(b)(1) of the Act, 21 U.S.C.section 360bbb-3(b)(1), unless the authorization is terminated  or revoked sooner.       Influenza A by PCR NEGATIVE NEGATIVE Final   Influenza B by PCR NEGATIVE NEGATIVE Final    Comment: (NOTE) The Xpert Xpress SARS-CoV-2/FLU/RSV plus assay is intended as an aid in the diagnosis of influenza from Nasopharyngeal swab specimens and should not be used as a sole basis for treatment. Nasal washings and aspirates are unacceptable for Xpert Xpress SARS-CoV-2/FLU/RSV testing.  Fact Sheet for Patients: EntrepreneurPulse.com.au  Fact Sheet for  Healthcare Providers: IncredibleEmployment.be  This test is not yet approved or cleared by the [place] FDA and has been authorized for detection and/or diagnosis of SARS-CoV-2 by FDA under an Emergency Use Authorization (EUA). This EUA will remain in effect (meaning this test can be used) for the duration of the COVID-19 declaration under Section 564(b)(1) of the Act, 21 U.S.C. section 360bbb-3(b)(1), unless the authorization is terminated or revoked.  Performed at [facility], [address] [place], [place] [address]          Radiology Studies: DG Chest 2 View  Result Date: 03/26/2021 CLINICAL DATA:  Altered mental status EXAM: CHEST - 2 VIEW COMPARISON:  03/04/2021 FINDINGS: Lung volumes are small. Small bilateral pleural effusions have developed since prior examination with associated left basilar compressive atelectasis. No pneumothorax. Cardiac size within normal limits. Pulmonary vascularity is normal. No acute bone abnormality. IMPRESSION: Interval development of small bilateral pleural effusions with associated left basilar atelectasis. Electronically Signed   By: Fidela Salisbury M.D.   On: 03/26/2021 23:53   CT Head Wo Contrast  Result Date: 03/26/2021 CLINICAL DATA:  Left eye droop EXAM: CT HEAD WITHOUT CONTRAST TECHNIQUE: Contiguous axial images were obtained from the base of the skull through the vertex without intravenous contrast. COMPARISON:  03/26/2021 FINDINGS: Brain: Mild atrophic changes and chronic white matter ischemic changes are identified. A few small lacunar infarcts are noted within the basal ganglia on the right stable in appearance from the prior exam. No acute hemorrhage or acute infarction is seen. Vascular: No hyperdense vessel or unexpected calcification. Skull: Normal. Negative for fracture or focal lesion. Sinuses/Orbits: No acute finding. Other: None. IMPRESSION: Chronic atrophic and ischemic changes without acute  abnormality. Electronically Signed   By: Inez Catalina M.D.   On: 03/26/2021 21:21   MR ANGIO HEAD WO CONTRAST  Result Date: 03/27/2021 CLINICAL DATA:  Neuro deficit, stroke suspected, left third nerve palsy, concern for aneurysm EXAM: MRA NECK WITHOUT AND WITH CONTRAST MRA HEAD WITHOUT CONTRAST TECHNIQUE: Multiplanar and multiecho pulse sequences of the neck were obtained without and with intravenous contrast. Angiographic images of the neck were obtained using MRA technique without and with intravenous contrast; Angiographic images of the Circle of Willis were obtained using MRA technique without intravenous contrast. CONTRAST:  73mL GADAVIST GADOBUTROL 1 MMOL/ML IV SOLN COMPARISON:  None. FINDINGS: MRA NECK FINDINGS Three-vessel aortic arch. The proximal left common carotid is not well evaluated due to artifact. No evidence of aortic aneurysm or dissection. No evidence of hemodynamically significant stenosis (greater than 50%), dissection, or occlusion in the bilateral common and internal carotid arteries. The right vertebral artery is not  well visualized for the entirety of its course, which may indicate slow flow or stenosis. The origin of the left vertebral artery is not well visualized, secondary to artifact. The remainder of the left vertebral artery is patent, without significant stenosis (greater than 50%), dissection, or occlusion. MRA HEAD FINDINGS Both internal carotid arteries are patent to the termini, without stenosis. Somewhat fusiform dilatation of the right cavernous ICA (series 9, image 95). Focal stenosis of the origin of the left A1 (series 9, image 107). Normal right A1. Anterior cerebral arteries are patent to their distal aspects. No M1 stenosis or occlusion. Normal MCA bifurcations. Distal MCA branches perfused and relatively symmetric. The right vertebral artery is not visualized. The left vertebral artery is patent to the vertebrobasilar junction. The basilar artery demonstrates focal  loss of signal (series 9, image 83), likely severe focal stenosis or occlusion. Focal narrowing is also noted in the right P1 segment, with a patent right posterior communicating artery. The left P1 is not visualized. Patent left posterior communicating artery with fetal origin of the left PCA. Focal narrowing at the origin of the right P3 branch (series 9, image 131). PCAs are grossly patent to their distal aspects. IMPRESSION: 1. The right vertebral artery is not well visualized for the entirety of its course, both extra cranially and intracranially, likely occluded although slow flow or severe stenosis can appear similar. Consider CTA head and neck for further evaluation. 2. Fusiform dilatation of the right cavernous ICA. 3. 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 4. Focal stenosis in the left P1 and left A1. Narrowing is also noted at the origin of the right P3 branch. 5. The origin of the left vertebral artery and left common carotid artery are not well visualized, secondary to artifact. Electronically Signed   By: Merilyn Baba M.D.   On: 03/27/2021 02:19   MR Angiogram Neck W or Wo Contrast  Result Date: 03/27/2021 CLINICAL DATA:  Neuro deficit, stroke suspected, left third nerve palsy, concern for aneurysm EXAM: MRA NECK WITHOUT AND WITH CONTRAST MRA HEAD WITHOUT CONTRAST TECHNIQUE: Multiplanar and multiecho pulse sequences of the neck were obtained without and with intravenous contrast. Angiographic images of the neck were obtained using MRA technique without and with intravenous contrast; Angiographic images of the Circle of Willis were obtained using MRA technique without intravenous contrast. CONTRAST:  59mL GADAVIST GADOBUTROL 1 MMOL/ML IV SOLN COMPARISON:  None. FINDINGS: MRA NECK FINDINGS Three-vessel aortic arch. The proximal left common carotid is not well evaluated due to artifact. No evidence of aortic aneurysm or dissection. No evidence of  hemodynamically significant stenosis (greater than 50%), dissection, or occlusion in the bilateral common and internal carotid arteries. The right vertebral artery is not well visualized for the entirety of its course, which may indicate slow flow or stenosis. The origin of the left vertebral artery is not well visualized, secondary to artifact. The remainder of the left vertebral artery is patent, without significant stenosis (greater than 50%), dissection, or occlusion. MRA HEAD FINDINGS Both internal carotid arteries are patent to the termini, without stenosis. Somewhat fusiform dilatation of the right cavernous ICA (series 9, image 95). Focal stenosis of the origin of the left A1 (series 9, image 107). Normal right A1. Anterior cerebral arteries are patent to their distal aspects. No M1 stenosis or occlusion. Normal MCA bifurcations. Distal MCA branches perfused and relatively symmetric. The right vertebral artery is not visualized. The left vertebral artery is patent to the vertebrobasilar  junction. The basilar artery demonstrates focal loss of signal (series 9, image 83), likely severe focal stenosis or occlusion. Focal narrowing is also noted in the right P1 segment, with a patent right posterior communicating artery. The left P1 is not visualized. Patent left posterior communicating artery with fetal origin of the left PCA. Focal narrowing at the origin of the right P3 branch (series 9, image 131). PCAs are grossly patent to their distal aspects. IMPRESSION: 1. The right vertebral artery is not well visualized for the entirety of its course, both extra cranially and intracranially, likely occluded although slow flow or severe stenosis can appear similar. Consider CTA head and neck for further evaluation. 2. Fusiform dilatation of the right cavernous ICA. 3. 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 4. Focal stenosis in the left P1 and left A1. Narrowing is also noted at  the origin of the right P3 branch. 5. The origin of the left vertebral artery and left common carotid artery are not well visualized, secondary to artifact. Electronically Signed   By: Merilyn Baba M.D.   On: 03/27/2021 02:19   MR BRAIN WO CONTRAST  Result Date: 03/26/2021 CLINICAL DATA:  Neuro deficit, stroke suspected, loss of vision in left eye EXAM: MRI HEAD WITHOUT CONTRAST TECHNIQUE: Multiplanar, multiecho pulse sequences of the brain and surrounding structures were obtained without intravenous contrast. COMPARISON:  No prior MRI, correlation is made with CT head 03/26/2021 FINDINGS: Brain: No restricted diffusion to suggest acute or subacute infarct. No acute hemorrhage, mass, mass effect, or midline shift. Lacunar infarcts in the right basal ganglia. T2 hyperintense signal in the periventricular white matter, likely the sequela of chronic small vessel ischemic disease. No hydrocephalus or extra-axial collection. Focus of hemosiderin deposition in the right medial temporal lobe, likely sequela of prior hypertensive microhemorrhage. Vascular: Normal flow voids. Skull and upper cervical spine: Normal marrow signal. Sinuses/Orbits: Negative.  Status post right lens replacement. Other: Fluid in the bilateral mastoid air cells. IMPRESSION: No acute intracranial process. Electronically Signed   By: Merilyn Baba M.D.   On: 03/26/2021 23:50   CT Orbits W Contrast  Result Date: 03/26/2021 CLINICAL DATA:  Orbital cellulitis suspected, left eyelid droop EXAM: CT ORBITS WITH CONTRAST TECHNIQUE: Multidetector CT images was performed according to the standard protocol following intravenous contrast administration. CONTRAST:  69mL OMNIPAQUE IOHEXOL 300 MG/ML  SOLN COMPARISON:  No prior CT orbits, correlation is made with 03/07/2021 CT head FINDINGS: Orbits: No orbital mass or evidence of inflammation. Normal appearance of the globes, optic nerve-sheath complexes, extraocular muscles, orbital fat and lacrimal  glands. Status post right lens replacement. Visible paranasal sinuses: Minimal mucosal thickening, grossly clear. Soft tissues: Normal. Osseous: No fracture or aggressive lesion. Limited intracranial: No acute or significant finding. IMPRESSION: No acute process in the orbits.  No evidence of orbital cellulitis. Electronically Signed   By: Merilyn Baba M.D.   On: 03/26/2021 19:28        Scheduled Meds:  alfuzosin  10 mg Oral Q breakfast   ascorbic acid  500 mg Oral BID   aspirin EC  81 mg Oral Daily   carvedilol  12.5 mg Oral BID WC   enoxaparin (LOVENOX) injection  40 mg Subcutaneous Q24H   ferrous sulfate  325 mg Oral Daily   finasteride  5 mg Oral Daily   fluconazole  100 mg Oral Daily   fluticasone  2 spray Each Nare Daily   lidocaine  1 patch Transdermal Q24H  melatonin  2.5 mg Oral QHS   multivitamin with minerals  1 tablet Oral Daily   pantoprazole  20 mg Oral Daily   pravastatin  40 mg Oral Q2000   tiotropium  1 capsule Inhalation Daily   torsemide  20 mg Oral Daily   valACYclovir  500 mg Oral BID   zinc sulfate  220 mg Oral Daily   Continuous Infusions:  sodium chloride 100 mL/hr at 03/27/21 0500    LOS: 0 days   Time spent: 62min  Maely Clements C Cam Harnden, DO [facility]  If 7PM-7AM, please contact night-coverage [email or web address]  03/27/2021, 8:05 AM

## 2021-03-27 NOTE — Consult Note (Signed)
Subjective: 87 yo pt presents with c/o left sided decreased vision and droopy eyelid for a couple of days.  Pt is known to the [facility], last seen Jan. 2019. Pertinent history includes DM, HTN, cataract OS.  Objective: Vital signs in last 24 hours: Temp:  [97.9 F (36.6 C)-98.1 F (36.7 C)] 98 F (36.7 C) (12/17 0800) Pulse Rate:  [70-90] 70 (12/17 0800) Resp:  [17-20] 20 (12/17 0800) BP: (117-151)/(53-74) 138/53 (12/17 0800) SpO2:  [92 %-97 %] 97 % (12/17 0800) Weight:  [76.7 kg] 76.7 kg (12/16 1645)    Objective: Pt is slightly confused but cooperative. 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 Third nerve function is mostly intact.    Left eye has ADDduction deficit, with some vertical gaze deficits as well. ABduction is intact.  Anterior Segment : Normal OD notable for pseudophakia.                                Normal OS notable for significant cataract, both NS and small PSC  Posterior Segment : Normal appearing ONH OU                                 Perfused and attached retinal OU. No visible diabetic retinopathy.  Recent Labs    03/26/21 1641  WBC 10.7*  HGB 10.4*  HCT 31.1*  NA 127*  K 4.1  CL 93*  CO2 27  BUN 25*  CREATININE 1.00    Studies/Results: DG Chest 2 View  Result Date: 03/26/2021 CLINICAL DATA:  Altered mental status EXAM: CHEST - 2 VIEW COMPARISON:  03/04/2021 FINDINGS: Lung volumes are small. Small bilateral pleural effusions have developed since prior examination with associated left basilar compressive atelectasis. No pneumothorax. Cardiac size within normal limits. Pulmonary vascularity is normal. No acute bone abnormality. IMPRESSION: Interval development of small bilateral pleural effusions with associated left basilar atelectasis. Electronically Signed   By: Fidela Salisbury M.D.   On: 03/26/2021 23:53   CT Head  Wo Contrast  Result Date: 03/26/2021 CLINICAL DATA:  Left eye droop EXAM: CT HEAD WITHOUT CONTRAST TECHNIQUE: Contiguous axial images were obtained from the base of the skull through the vertex without intravenous contrast. COMPARISON:  03/26/2021 FINDINGS: Brain: Mild atrophic changes and chronic white matter ischemic changes are identified. A few small lacunar infarcts are noted within the basal ganglia on the right stable in appearance from the prior exam. No acute hemorrhage or acute infarction is seen. Vascular: No hyperdense vessel or unexpected calcification. Skull: Normal. Negative for fracture or focal lesion. Sinuses/Orbits: No acute finding. Other: None. IMPRESSION: Chronic atrophic and ischemic changes without acute abnormality. Electronically Signed   By: Inez Catalina M.D.   On: 03/26/2021 21:21   MR ANGIO HEAD WO CONTRAST  Result Date: 03/27/2021 CLINICAL DATA:  Neuro deficit, stroke suspected, left third nerve palsy, concern for aneurysm EXAM: MRA NECK WITHOUT AND WITH CONTRAST MRA HEAD WITHOUT CONTRAST TECHNIQUE: Multiplanar and multiecho pulse sequences of the neck were obtained without and with intravenous contrast. Angiographic images of the neck were obtained using MRA technique without and with intravenous contrast; Angiographic images of the Circle of Willis were obtained using MRA technique without  intravenous contrast. CONTRAST:  3mL GADAVIST GADOBUTROL 1 MMOL/ML IV SOLN COMPARISON:  None. FINDINGS: MRA NECK FINDINGS Three-vessel aortic arch. The proximal left common carotid is not well evaluated due to artifact. No evidence of aortic aneurysm or dissection. No evidence of hemodynamically significant stenosis (greater than 50%), dissection, or occlusion in the bilateral common and internal carotid arteries. The right vertebral artery is not well visualized for the entirety of its course, which may indicate slow flow or stenosis. The origin of the left vertebral artery is not well  visualized, secondary to artifact. The remainder of the left vertebral artery is patent, without significant stenosis (greater than 50%), dissection, or occlusion. MRA HEAD FINDINGS Both internal carotid arteries are patent to the termini, without stenosis. Somewhat fusiform dilatation of the right cavernous ICA (series 9, image 95). Focal stenosis of the origin of the left A1 (series 9, image 107). Normal right A1. Anterior cerebral arteries are patent to their distal aspects. No M1 stenosis or occlusion. Normal MCA bifurcations. Distal MCA branches perfused and relatively symmetric. The right vertebral artery is not visualized. The left vertebral artery is patent to the vertebrobasilar junction. The basilar artery demonstrates focal loss of signal (series 9, image 83), likely severe focal stenosis or occlusion. Focal narrowing is also noted in the right P1 segment, with a patent right posterior communicating artery. The left P1 is not visualized. Patent left posterior communicating artery with fetal origin of the left PCA. Focal narrowing at the origin of the right P3 branch (series 9, image 131). PCAs are grossly patent to their distal aspects. IMPRESSION: 1. The right vertebral artery is not well visualized for the entirety of its course, both extra cranially and intracranially, likely occluded although slow flow or severe stenosis can appear similar. Consider CTA head and neck for further evaluation. 2. Fusiform dilatation of the right cavernous ICA. 3. 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 4. Focal stenosis in the left P1 and left A1. Narrowing is also noted at the origin of the right P3 branch. 5. The origin of the left vertebral artery and left common carotid artery are not well visualized, secondary to artifact. Electronically Signed   By: Merilyn Baba M.D.   On: 03/27/2021 02:19   MR Angiogram Neck W or Wo Contrast  Result Date: 03/27/2021 CLINICAL DATA:   Neuro deficit, stroke suspected, left third nerve palsy, concern for aneurysm EXAM: MRA NECK WITHOUT AND WITH CONTRAST MRA HEAD WITHOUT CONTRAST TECHNIQUE: Multiplanar and multiecho pulse sequences of the neck were obtained without and with intravenous contrast. Angiographic images of the neck were obtained using MRA technique without and with intravenous contrast; Angiographic images of the Circle of Willis were obtained using MRA technique without intravenous contrast. CONTRAST:  26mL GADAVIST GADOBUTROL 1 MMOL/ML IV SOLN COMPARISON:  None. FINDINGS: MRA NECK FINDINGS Three-vessel aortic arch. The proximal left common carotid is not well evaluated due to artifact. No evidence of aortic aneurysm or dissection. No evidence of hemodynamically significant stenosis (greater than 50%), dissection, or occlusion in the bilateral common and internal carotid arteries. The right vertebral artery is not well visualized for the entirety of its course, which may indicate slow flow or stenosis. The origin of the left vertebral artery is not well visualized, secondary to artifact. The remainder of the left vertebral artery is patent, without significant stenosis (greater than 50%), dissection, or occlusion. MRA HEAD FINDINGS Both internal carotid arteries are patent to the termini, without stenosis. Somewhat fusiform  dilatation of the right cavernous ICA (series 9, image 95). Focal stenosis of the origin of the left A1 (series 9, image 107). Normal right A1. Anterior cerebral arteries are patent to their distal aspects. No M1 stenosis or occlusion. Normal MCA bifurcations. Distal MCA branches perfused and relatively symmetric. The right vertebral artery is not visualized. The left vertebral artery is patent to the vertebrobasilar junction. The basilar artery demonstrates focal loss of signal (series 9, image 83), likely severe focal stenosis or occlusion. Focal narrowing is also noted in the right P1 segment, with a patent right  posterior communicating artery. The left P1 is not visualized. Patent left posterior communicating artery with fetal origin of the left PCA. Focal narrowing at the origin of the right P3 branch (series 9, image 131). PCAs are grossly patent to their distal aspects. IMPRESSION: 1. The right vertebral artery is not well visualized for the entirety of its course, both extra cranially and intracranially, likely occluded although slow flow or severe stenosis can appear similar. Consider CTA head and neck for further evaluation. 2. Fusiform dilatation of the right cavernous ICA. 3. 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 4. Focal stenosis in the left P1 and left A1. Narrowing is also noted at the origin of the right P3 branch. 5. The origin of the left vertebral artery and left common carotid artery are not well visualized, secondary to artifact. Electronically Signed   By: Merilyn Baba M.D.   On: 03/27/2021 02:19   MR BRAIN WO CONTRAST  Result Date: 03/26/2021 CLINICAL DATA:  Neuro deficit, stroke suspected, loss of vision in left eye EXAM: MRI HEAD WITHOUT CONTRAST TECHNIQUE: Multiplanar, multiecho pulse sequences of the brain and surrounding structures were obtained without intravenous contrast. COMPARISON:  No prior MRI, correlation is made with CT head 03/26/2021 FINDINGS: Brain: No restricted diffusion to suggest acute or subacute infarct. No acute hemorrhage, mass, mass effect, or midline shift. Lacunar infarcts in the right basal ganglia. T2 hyperintense signal in the periventricular white matter, likely the sequela of chronic small vessel ischemic disease. No hydrocephalus or extra-axial collection. Focus of hemosiderin deposition in the right medial temporal lobe, likely sequela of prior hypertensive microhemorrhage. Vascular: Normal flow voids. Skull and upper cervical spine: Normal marrow signal. Sinuses/Orbits: Negative.  Status post right lens replacement. Other:  Fluid in the bilateral mastoid air cells. IMPRESSION: No acute intracranial process. Electronically Signed   By: Merilyn Baba M.D.   On: 03/26/2021 23:50   CT Orbits W Contrast  Result Date: 03/26/2021 CLINICAL DATA:  Orbital cellulitis suspected, left eyelid droop EXAM: CT ORBITS WITH CONTRAST TECHNIQUE: Multidetector CT images was performed according to the standard protocol following intravenous contrast administration. CONTRAST:  67mL OMNIPAQUE IOHEXOL 300 MG/ML  SOLN COMPARISON:  No prior CT orbits, correlation is made with 03/07/2021 CT head FINDINGS: Orbits: No orbital mass or evidence of inflammation. Normal appearance of the globes, optic nerve-sheath complexes, extraocular muscles, orbital fat and lacrimal glands. Status post right lens replacement. Visible paranasal sinuses: Minimal mucosal thickening, grossly clear. Soft tissues: Normal. Osseous: No fracture or aggressive lesion. Limited intracranial: No acute or significant finding. IMPRESSION: No acute process in the orbits.  No evidence of orbital cellulitis. Electronically Signed   By: Merilyn Baba M.D.   On: 03/26/2021 19:28    Medications: I have reviewed the patient's current medications.  Assessment/Plan: 89 pt with bilateral ophthalmoplegia and reduced vision OS.  MRA ruled out aneurysm. 1) Ophthalmoplegia Ddx includes  Left Internuclear Ophthalmoplegia. This is consistent with his exam and vertebrobasilar insufficiency demonstrated on MRA. Other etiology may be " microvascular" ophthalmoplegia of both the CN VI OD and the CN III OS, given the significant involvement of the left upper lid. 2) Visually significant cataract OS  The patient is not diplopic as the left lid obscures vision. If the left lid recovers some function it would be reasonable to frost the left lens of a pair of spectacles.  Cataract extraction OS is possible in the future but may depend on the state of several comorbidities.  CVA risk management is the  most important aspect of this patients care based on today's exam.  If the etiology is in fact microvascular, he is likely to regain much of his ocular motility Observation for improvement over the next 2-3 months will determine.  Pt should be instructed to follow up at the [facility] in 1 month.  I gave my card to his nurse.  LOS: 0 days   Birder Robson 12/17/202210:22 AM

## 2021-03-27 NOTE — ED Notes (Signed)
Neurologist Dr. Cheral Marker at bedside

## 2021-03-27 NOTE — ED Notes (Signed)
Patient needed to defecate and when rolled patient over for bedpan found approx. 4 x 4 stage pressure wound in lower sacral area. Placed sacral padding on patient and brief overtop. Pt did not defecate. Pt's foley catheter also had mildly purulent material around prepuce- cleaned patient with peri-wipes and wiped foley with chlorhex. Foley bag and line also had some sediment/mucoid looking material in line and bag. Attending MD notified of all findings.

## 2021-03-27 NOTE — ED Notes (Signed)
Patient placed on hospital bed for comfort; RN Cassell Smiles, RN [place] to assist

## 2021-03-27 NOTE — ED Notes (Signed)
Called Dr. Cheral Marker re: change in findings with patient's neuro status and ophthalmic changes/vision changes and increased ptosis in right eye.

## 2021-03-27 NOTE — Consult Note (Signed)
NEURO HOSPITALIST CONSULT NOTE   Requestig physician: Dr. Avon Gully  Reason for Consult: AMS, hallucinations, ptosis and ophthalmoparesis  History obtained from:  EDP, Hospitalist, Patient and Chart     HPI:                                                                                                                                          Matthew Miles. is an 87 y.o. male with a PMHx of CHF, COPD, CAD, DM2, cataract extraction, hypercholesterolemia, prostate cancer s/p radiation therapy and arthritis who presented to the [facility] ED from [facility] with a 2 day history of left eyelid ptosis, in conjunction with generalized weakness and malaise. He also endorsed worsened vision in his left eye since Thursday night. In Triage he was unable to open his left eye but no other neurological deficits were noted. CBG was 245 and BP 135/80 with 96% O2 sats on RA at presentation. On initlal exam by EDP, ptosis was verified and it was noted that he had "light perception but nothing else" in his left eye. His pupils were small on initial evaluation. His left eye could not look fully to the right, but would move fully to the left. His upward vertical gaze was also impaired. The patient also complained of swelling and pain around his left eye for the past week, in conjunction with a yellowish/green discharge coming from the eye. He had no fever/chills, CP, SOB or sinus conjestion.   Later during his ED stay, the patient began to exhibit signs of delirium, with some waxing and waning mental status and picking at objects that were not there.   CT  of orbits showed no evidence for orbital cellulitis.   MRI was obtained, revealing no acute abnormality. MRA of head revealed no aneurysm, ruling out 3rd nerve palsy secondary to aneurysmal compression.   Labs in the ED were remarkable for low Na of 127. AST 76, ALT 116, WBCC 10.7.   Ophthalmology saw the patient this morning, with the  following assessment: "89 pt with bilateral ophthalmoplegia and reduced vision OS.  MRA ruled out aneurysm. 1) Ophthalmoplegia Ddx includes Left Internuclear Ophthalmoplegia. This is consistent with his exam and vertebrobasilar insufficiency demonstrated on MRA. Other etiology may be " microvascular" ophthalmoplegia of both the CN VI OD and the CN III OS, given the significant involvement of the left upper lid. 2) Visually significant cataract OS. The patient is not diplopic as the left lid obscures vision. If the left lid recovers some function it would be reasonable to frost the left lens of a pair of spectacles. Cataract extraction OS is possible in the future but may depend on the state of several comorbidities. CVA risk management is the most important aspect of this patients care based  on today's exam. If the etiology is in fact microvascular, he is likely to regain much of his ocular motility. Observation for improvement over the next 2-3 months will determine."   Past Medical History:  Diagnosis Date   Allergy    Arthritis    CHF (congestive heart failure) (HCC)    Chicken pox    Cholecystitis    Colon polyps    COPD (chronic obstructive pulmonary disease) (HCC)    Coronary artery disease    Diabetes mellitus without complication (HCC)    Emphysema of lung (HCC)    GERD (gastroesophageal reflux disease)    Heart murmur    Hematemesis/vomiting blood 04/10/11   Hypercholesterolemia    Mild hypertension    Pancreatitis    Prostate cancer ([place])    Prostate cancer ([place])    radiation therapy    Smoker    Ulcer    Upper GI bleed 1980    Past Surgical History:  Procedure Laterality Date   APPENDECTOMY     CARDIAC CATHETERIZATION  May 2002 and Feb 2013   [place]; no stents    CATARACT EXTRACTION     CHOLECYSTECTOMY     CIRCUMCISION     COLONOSCOPY     HEMORRHOID SURGERY     SP CHOLECYSTOMY     STOMACH SURGERY     bleeding ulcers, followed by Dr. [name]    Family History   Problem Relation Age of Onset   Prostate cancer Father    Liver cancer Brother    Prostate cancer Brother    Emphysema Sister        smoker             Social History:  reports that he quit smoking about 9 years ago. His smoking use included cigarettes. He has a 65.00 pack-year smoking history. He has quit using smokeless tobacco.  His smokeless tobacco use included chew. He reports current alcohol use. He reports that he does not use drugs.  Allergies  Allergen Reactions   Oxybutynin     Dry mouth and constant urination.   Sulfa Antibiotics     GI upset   Tradjenta [Linagliptin] Other (See Comments)    Hair loss    MEDICATIONS:                                                                                                                     No current facility-administered medications on file prior to encounter.   Current Outpatient Medications on File Prior to Encounter  Medication Sig Dispense Refill   Accu-Chek FastClix Lancets MISC Inject 1 each into the skin in the morning and at bedtime. 100 each 4   ACCU-CHEK GUIDE test strip TEST BLOOD SUGAR TWICE DAILY AS DIRECTED 200 strip 2   acetaminophen (TYLENOL) 325 MG tablet Take 650 mg by mouth every 6 (six) hours as needed.     alfuzosin (UROXATRAL) 10 MG 24 hr tablet Take 1 tablet (10 mg total) by mouth daily with  breakfast. 90 tablet 0   ascorbic acid (VITAMIN C) 500 MG tablet Take 500 mg by mouth 2 (two) times daily.     aspirin 81 MG tablet Take 81 mg by mouth daily.      blood glucose meter kit and supplies KIT Dispense based on patient and insurance preference. Use once daily as directed. Dx Code E11.9 1 each 0   Calcium Citrate-Vitamin D (CALCIUM CITRATE + D PO) Take by mouth. 1200 mg of calcium and [phone] units of vitamin D daily     carvedilol (COREG) 6.25 MG tablet TAKE 1 TABLET TWICE DAILY (Patient taking differently: Take 12.5 mg by mouth 2 (two) times daily with a meal. TAKE 1 TABLET TWICE DAILY) 180 tablet 3    collagenase (SANTYL) ointment Apply 1 application topically daily.     ferrous sulfate 325 (65 FE) MG EC tablet Take 325 mg by mouth daily.     finasteride (PROSCAR) 5 MG tablet Take 5 mg by mouth daily.     fluconazole (DIFLUCAN) 100 MG tablet Take 100 mg by mouth daily.     fluticasone (FLONASE) 50 MCG/ACT nasal spray Place 2 sprays into both nostrils daily.     insulin lispro (HUMALOG) 100 UNIT/ML injection Inject 1-4 Units into the skin 3 (three) times daily before meals. Per sliding scale     lidocaine (LIDODERM) 5 % Place 1 patch onto the skin daily. Remove & Discard patch within 12 hours or as directed by MD     melatonin 3 MG TABS tablet Take 3 mg by mouth at bedtime.     metFORMIN (GLUCOPHAGE-XR) 500 MG 24 hr tablet TAKE 2 TABLETS(1000 MG) BY MOUTH TWICE DAILY 360 tablet 3   Multiple Vitamin (MULTIVITAMIN WITH MINERALS) TABS tablet Take 1 tablet by mouth daily.     mupirocin ointment (BACTROBAN) 2 % 1 application 2 (two) times daily as needed.     nitroGLYCERIN (NITROSTAT) 0.4 MG SL tablet Place 1 tablet (0.4 mg total) under the tongue every 5 (five) minutes as needed. 25 tablet 3   oxyCODONE (OXY IR/ROXICODONE) 5 MG immediate release tablet Take 5 mg by mouth every 6 (six) hours as needed for severe pain.     pantoprazole (PROTONIX) 20 MG tablet TAKE 1 TABLET EVERY DAY 90 tablet 3   pravastatin (PRAVACHOL) 40 MG tablet Take 1 tablet (40 mg total) by mouth daily. 90 tablet 3   PROAIR HFA 108 (90 Base) MCG/ACT inhaler INHALE 2 PUFFS INTO THE LUNGS EVERY 6 HOURS AS NEEDED FOR WHEEZING OR SHORTNESS OF BREATH 8.5 g 0   Tiotropium Bromide Monohydrate (SPIRIVA RESPIMAT) 2.5 MCG/ACT AERS Inhale 2 puffs into the lungs daily. 4 g 5   torsemide (DEMADEX) 10 MG tablet TAKE 1 TABLET(10 MG) BY MOUTH TWICE DAILY AS NEEDED (Patient taking differently: Take 20 mg by mouth daily. Per [name]) 180 tablet 3   traZODone (DESYREL) 50 MG tablet Take 50 mg by mouth at bedtime as needed for sleep.      TRULICITY 1.5 PP/5.0DT SOPN INJECT 1.5MG (1 PEN) SUBCUTANEOUSLY EVERY WEEK 6 mL 1   valACYclovir (VALTREX) 500 MG tablet Take 500 mg by mouth 2 (two) times daily.     zinc sulfate 220 (50 Zn) MG capsule Take 220 mg by mouth daily.     cephALEXin (KEFLEX) 500 MG capsule Take 1 capsule (500 mg total) by mouth 3 (three) times daily. (Patient not taking: Reported on 03/26/2021) 21 capsule 0   potassium chloride (KLOR-CON) 10  MEQ tablet Take 1 tablet (10 mEq total) by mouth every other day. (Patient not taking: Reported on 03/26/2021) 45 tablet 3   predniSONE (DELTASONE) 20 MG tablet Take 2 tablets(55m) by mouth each morning for 7 days, then 1 tablet (232m by mouth each morning for the next 7 days. (Patient not taking: Reported on 03/26/2021)       Scheduled:  alfuzosin  10 mg Oral Q breakfast   ascorbic acid  500 mg Oral BID   aspirin EC  81 mg Oral Daily   carvedilol  12.5 mg Oral BID WC   enoxaparin (LOVENOX) injection  40 mg Subcutaneous Q24H   ferrous sulfate  325 mg Oral Daily   finasteride  5 mg Oral Daily   fluconazole  100 mg Oral Daily   fluticasone  2 spray Each Nare Daily   lidocaine  1 patch Transdermal Q24H   melatonin  2.5 mg Oral QHS   multivitamin with minerals  1 tablet Oral Daily   pantoprazole  20 mg Oral Daily   pravastatin  40 mg Oral Q2000   tiotropium  1 capsule Inhalation Daily   torsemide  20 mg Oral Daily   valACYclovir  500 mg Oral BID   zinc sulfate  220 mg Oral Daily   Continuous:  sodium chloride 100 mL/hr at 03/27/21 1431     ROS:                                                                                                                                       As per HPI.    Blood pressure 129/66, pulse 88, temperature 98.5 F (36.9 C), temperature source Oral, resp. rate 20, height 5' 4"  (1.626 m), weight 76.7 kg, SpO2 96 %.   General Examination:                                                                                                        Physical Exam  HEENT-  [place]/AT. No neck stiffness Lungs- Respirations unlabored Extremities- Warm and well perfused   Neurological Examination Mental Status: Awake and alert. Speech is fluent and nondysarthric with intact naming and comprehension. Oriented to being in a hospital, but [facility] able to correctly state the day of the week, the year, the month and the state. He is able to say the months of the year without error and also does so backwards with only one error. He has poor  insight. He does have formed visual hallucinations, for example during exam he picks at an imaginary object during exam, grabs a corner of his sheet and attempts to eat it, stating that it is a cantaloupe. Cranial Nerves: II: When tested in each eye individually, visual fields of all 4 quadrants have intact peripheral vision. Can name objects visually and count fingers, but cannot read large type with either eye. Of note, his pupils are enlarged at 5 mm consistent with residual pharmacologic dilatation which may reduce visual acuity. Pupils are round and react to penlight symmetrically to 3 mm.   III,IV, VI:  - Severe left ptosis. Right eyelid is normal.  - Inability to make eye contact or fixate directly on objects, but can count fingers and name objects visually using each eye individually. - Right eye with abduction paralysis. Some impairment of up and down-gaze. Medial rotation intact.   Left eye with adduction deficit, as well as some difficulty with vertical gaze. Left eye abduction (lateral deviation) is intact. V: Temp sensation intact bilaterally VII: Smile symmetric VIII: Mildly HOH  IX,X: No hypophonia or hoarseness XI: Head is midline.  XII: Midline tongue extension Motor: RUE 4+/5 LUE 4+/5 RLE 2/5 hip flexion, 4-/5 knee extension and knee flexion, ADF and APF 4/5 LLE 2/5 hip flexion, 4-/5 knee extension and knee flexion, ADF and APF 4/5 Sensory: Temp and FT intact x 4. No  extinction to DSS.  Deep Tendon Reflexes: 2+ and symmetric brachioradialis and patellars Plantars: Right: downgoing  Left: downgoing Cerebellar: Past-pointing is severe bilaterally, with slow movements. Patient noted not to be looking directly at examiner's finger when attempting to touch. Exam performed with one eye open only on each side, with same results.  Gait: Deferred      Lab Results: Basic Metabolic Panel: Recent Labs  Lab 03/26/21 1641  NA 127*  K 4.1  CL 93*  CO2 27  GLUCOSE 184*  BUN 25*  CREATININE 1.00  CALCIUM 8.3*    CBC: Recent Labs  Lab 03/26/21 1641  WBC 10.7*  NEUTROABS 8.7*  HGB 10.4*  HCT 31.1*  MCV 95.4  PLT 161    Cardiac Enzymes: No results for input(s): CKTOTAL, CKMB, CKMBINDEX, TROPONINI in the last 168 hours.  Lipid Panel: Recent Labs  Lab 03/27/21 0517  CHOL 132  TRIG 168*  HDL 24*  CHOLHDL 5.5  VLDL 34  LDLCALC 74    Imaging: DG Chest 2 View  Result Date: 03/26/2021 CLINICAL DATA:  Altered mental status EXAM: CHEST - 2 VIEW COMPARISON:  03/04/2021 FINDINGS: Lung volumes are small. Small bilateral pleural effusions have developed since prior examination with associated left basilar compressive atelectasis. No pneumothorax. Cardiac size within normal limits. Pulmonary vascularity is normal. No acute bone abnormality. IMPRESSION: Interval development of small bilateral pleural effusions with associated left basilar atelectasis. Electronically Signed   By: Fidela Salisbury M.D.   On: 03/26/2021 23:53   CT Head Wo Contrast  Result Date: 03/26/2021 CLINICAL DATA:  Left eye droop EXAM: CT HEAD WITHOUT CONTRAST TECHNIQUE: Contiguous axial images were obtained from the base of the skull through the vertex without intravenous contrast. COMPARISON:  03/26/2021 FINDINGS: Brain: Mild atrophic changes and chronic white matter ischemic changes are identified. A few small lacunar infarcts are noted within the basal ganglia on the right stable in  appearance from the prior exam. No acute hemorrhage or acute infarction is seen. Vascular: No hyperdense vessel or unexpected calcification. Skull: Normal. Negative for fracture or focal lesion.  Sinuses/Orbits: No acute finding. Other: None. IMPRESSION: Chronic atrophic and ischemic changes without acute abnormality. Electronically Signed   By: Inez Catalina M.D.   On: 03/26/2021 21:21   MR ANGIO HEAD WO CONTRAST  Result Date: 03/27/2021 CLINICAL DATA:  Neuro deficit, stroke suspected, left third nerve palsy, concern for aneurysm EXAM: MRA NECK WITHOUT AND WITH CONTRAST MRA HEAD WITHOUT CONTRAST TECHNIQUE: Multiplanar and multiecho pulse sequences of the neck were obtained without and with intravenous contrast. Angiographic images of the neck were obtained using MRA technique without and with intravenous contrast; Angiographic images of the Circle of Willis were obtained using MRA technique without intravenous contrast. CONTRAST:  54m GADAVIST GADOBUTROL 1 MMOL/ML IV SOLN COMPARISON:  None. FINDINGS: MRA NECK FINDINGS Three-vessel aortic arch. The proximal left common carotid is not well evaluated due to artifact. No evidence of aortic aneurysm or dissection. No evidence of hemodynamically significant stenosis (greater than 50%), dissection, or occlusion in the bilateral common and internal carotid arteries. The right vertebral artery is not well visualized for the entirety of its course, which may indicate slow flow or stenosis. The origin of the left vertebral artery is not well visualized, secondary to artifact. The remainder of the left vertebral artery is patent, without significant stenosis (greater than 50%), dissection, or occlusion. MRA HEAD FINDINGS Both internal carotid arteries are patent to the termini, without stenosis. Somewhat fusiform dilatation of the right cavernous ICA (series 9, image 95). Focal stenosis of the origin of the left A1 (series 9, image 107). Normal right A1. Anterior cerebral  arteries are patent to their distal aspects. No M1 stenosis or occlusion. Normal MCA bifurcations. Distal MCA branches perfused and relatively symmetric. The right vertebral artery is not visualized. The left vertebral artery is patent to the vertebrobasilar junction. The basilar artery demonstrates focal loss of signal (series 9, image 83), likely severe focal stenosis or occlusion. Focal narrowing is also noted in the right P1 segment, with a patent right posterior communicating artery. The left P1 is not visualized. Patent left posterior communicating artery with fetal origin of the left PCA. Focal narrowing at the origin of the right P3 branch (series 9, image 131). PCAs are grossly patent to their distal aspects. IMPRESSION: 1. The right vertebral artery is not well visualized for the entirety of its course, both extra cranially and intracranially, likely occluded although slow flow or severe stenosis can appear similar. Consider CTA head and neck for further evaluation. 2. Fusiform dilatation of the right cavernous ICA. 3. 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 4. Focal stenosis in the left P1 and left A1. Narrowing is also noted at the origin of the right P3 branch. 5. The origin of the left vertebral artery and left common carotid artery are not well visualized, secondary to artifact. Electronically Signed   By: AMerilyn BabaM.D.   On: 03/27/2021 02:19   MR Angiogram Neck W or Wo Contrast  Result Date: 03/27/2021 CLINICAL DATA:  Neuro deficit, stroke suspected, left third nerve palsy, concern for aneurysm EXAM: MRA NECK WITHOUT AND WITH CONTRAST MRA HEAD WITHOUT CONTRAST TECHNIQUE: Multiplanar and multiecho pulse sequences of the neck were obtained without and with intravenous contrast. Angiographic images of the neck were obtained using MRA technique without and with intravenous contrast; Angiographic images of the Circle of Willis were obtained using MRA  technique without intravenous contrast. CONTRAST:  851mGADAVIST GADOBUTROL 1 MMOL/ML IV SOLN COMPARISON:  None. FINDINGS: MRA NECK FINDINGS Three-vessel aortic  arch. The proximal left common carotid is not well evaluated due to artifact. No evidence of aortic aneurysm or dissection. No evidence of hemodynamically significant stenosis (greater than 50%), dissection, or occlusion in the bilateral common and internal carotid arteries. The right vertebral artery is not well visualized for the entirety of its course, which may indicate slow flow or stenosis. The origin of the left vertebral artery is not well visualized, secondary to artifact. The remainder of the left vertebral artery is patent, without significant stenosis (greater than 50%), dissection, or occlusion. MRA HEAD FINDINGS Both internal carotid arteries are patent to the termini, without stenosis. Somewhat fusiform dilatation of the right cavernous ICA (series 9, image 95). Focal stenosis of the origin of the left A1 (series 9, image 107). Normal right A1. Anterior cerebral arteries are patent to their distal aspects. No M1 stenosis or occlusion. Normal MCA bifurcations. Distal MCA branches perfused and relatively symmetric. The right vertebral artery is not visualized. The left vertebral artery is patent to the vertebrobasilar junction. The basilar artery demonstrates focal loss of signal (series 9, image 83), likely severe focal stenosis or occlusion. Focal narrowing is also noted in the right P1 segment, with a patent right posterior communicating artery. The left P1 is not visualized. Patent left posterior communicating artery with fetal origin of the left PCA. Focal narrowing at the origin of the right P3 branch (series 9, image 131). PCAs are grossly patent to their distal aspects. IMPRESSION: 1. The right vertebral artery is not well visualized for the entirety of its course, both extra cranially and intracranially, likely occluded although slow  flow or severe stenosis can appear similar. Consider CTA head and neck for further evaluation. 2. Fusiform dilatation of the right cavernous ICA. 3. 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 4. Focal stenosis in the left P1 and left A1. Narrowing is also noted at the origin of the right P3 branch. 5. The origin of the left vertebral artery and left common carotid artery are not well visualized, secondary to artifact. Electronically Signed   By: Merilyn Baba M.D.   On: 03/27/2021 02:19   MR BRAIN WO CONTRAST  Result Date: 03/26/2021 CLINICAL DATA:  Neuro deficit, stroke suspected, loss of vision in left eye EXAM: MRI HEAD WITHOUT CONTRAST TECHNIQUE: Multiplanar, multiecho pulse sequences of the brain and surrounding structures were obtained without intravenous contrast. COMPARISON:  No prior MRI, correlation is made with CT head 03/26/2021 FINDINGS: Brain: No restricted diffusion to suggest acute or subacute infarct. No acute hemorrhage, mass, mass effect, or midline shift. Lacunar infarcts in the right basal ganglia. T2 hyperintense signal in the periventricular white matter, likely the sequela of chronic small vessel ischemic disease. No hydrocephalus or extra-axial collection. Focus of hemosiderin deposition in the right medial temporal lobe, likely sequela of prior hypertensive microhemorrhage. Vascular: Normal flow voids. Skull and upper cervical spine: Normal marrow signal. Sinuses/Orbits: Negative.  Status post right lens replacement. Other: Fluid in the bilateral mastoid air cells. IMPRESSION: No acute intracranial process. Electronically Signed   By: Merilyn Baba M.D.   On: 03/26/2021 23:50   CT Orbits W Contrast  Result Date: 03/26/2021 CLINICAL DATA:  Orbital cellulitis suspected, left eyelid droop EXAM: CT ORBITS WITH CONTRAST TECHNIQUE: Multidetector CT images was performed according to the standard protocol following intravenous contrast administration.  CONTRAST:  16m OMNIPAQUE IOHEXOL 300 MG/ML  SOLN COMPARISON:  No prior CT orbits, correlation is made with 03/07/2021 CT head FINDINGS: Orbits:  No orbital mass or evidence of inflammation. Normal appearance of the globes, optic nerve-sheath complexes, extraocular muscles, orbital fat and lacrimal glands. Status post right lens replacement. Visible paranasal sinuses: Minimal mucosal thickening, grossly clear. Soft tissues: Normal. Osseous: No fracture or aggressive lesion. Limited intracranial: No acute or significant finding. IMPRESSION: No acute process in the orbits.  No evidence of orbital cellulitis. Electronically Signed   By: Merilyn Baba M.D.   On: 03/26/2021 19:28     Assessment/Recommendations: 87 year old male presenting with AMS, hallucinations, ptosis and ophthalmoparesis   Opthalmoparesis and ptosis: - Exam reveals inability to make eye contact or fixate directly on objects, but can count fingers and name objects visually using each eye individually. Probable impaired binocular vision due to past-pointing on a visually-guided task bilaterally, consistent with optic ataxia. His left eye movement abnormality with ptosis could be secondary to left 3rd nerve palsy (left eye 3rd nerve palsy with left eye ptosis and failure of medial rotation), which could be secondary to an acute diabetic 3rd nerve infarction. However, inability to fixate with right eye as well suggests possible other overlapping etiologies, including a parietal lobe lesion not visualizable on MRI (can result in optic ataxia and oculomotor apraxia) or acute thiamine deficiency, which would also explain his delirium and past-pointing (ataxia) on exam (Wernicke's encephalopathy triad of opthalmoparesis, encephalopathy and ataxia). Unclear if he has any risk factors for thiamine deficiency, however.  - MRI brain shows no acute intracranial process.  - CT orbits with no acute process in the orbits.  No evidence of orbital  cellulitis. - MRA of head: Fusiform dilatation of the right cavernous ICA. 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 Focal stenosis in the left P1 and left A1. Narrowing is also noted at the origin of the right P3 branch. The results rule out aneurysmal compression of the left 3rd cranial nerve. Also, he has no pupillary asymmetry.  - MRA neck: The right vertebral artery is not well visualized for the entirety of its course, both extra cranially and intracranially, likely occluded although slow flow or severe stenosis can appear similar. The origin of the left vertebral artery and left common carotid artery are not well visualized, secondary to artifact. Recommendations:  - STAT thiamine level has been ordered. After it has been drawn, start empiric high-dose IV thiamine supplementation at 500 mg IV TID x 3 days, then continue with 100 mg po qd thereafter.  - Agree with continuing ASA and statin given severe stenosis vs occlusion of the basilar artery on MRA head.  - Agree with Ophthalmology that If the etiology for the patient's ocular motility deficits is in fact microvascular, he is likely to regain much of his ocular motility Observation for improvement over the next 2-3 months will determine. .  Altered mental status with mild delirium and visual hallucinations. - Exam reveals mostly intact orientation as well as good attention and concentration. However, he does have formed visual hallucinations, for example during exam he picks at an imaginary object during exam, grabs a corner of his sheet and attempts to eat it, stating that it is a cantaloupe. No nuchal rigidity, fever, diaphoresis, flushing or significantly elevated white count to suggest a meningitis.  - DDx includes Wernicke's encephalopathy (see above) and acute hospital delirium. Recommendations: - Recommend discontinuing PRN Haldol. Avoid sedating meds. Lights on and OOB to chair during the day,  lights off at night in a quiet dark room.  - IVF with  a goal towards optimizing hydration and resolving his hyponatremia - Thiamine supplementation as recommended above.        Electronically signed: Dr. Kerney Elbe 03/27/2021, 6:33 PM

## 2021-03-28 DIAGNOSIS — L8990 Pressure ulcer of unspecified site, unspecified stage: Secondary | ICD-10-CM | POA: Diagnosis present

## 2021-03-28 LAB — BASIC METABOLIC PANEL
Anion gap: 9 (ref 5–15)
BUN: 21 mg/dL (ref 8–23)
CO2: 25 mmol/L (ref 22–32)
Calcium: 7.8 mg/dL — ABNORMAL LOW (ref 8.9–10.3)
Chloride: 97 mmol/L — ABNORMAL LOW (ref 98–111)
Creatinine, Ser: 1.02 mg/dL (ref 0.61–1.24)
GFR, Estimated: >60 mL/min (ref >60)
Glucose, Bld: 165 mg/dL — ABNORMAL HIGH (ref 70–99)
Potassium: 3.4 mmol/L — ABNORMAL LOW (ref 3.5–5.1)
Sodium: 131 mmol/L — ABNORMAL LOW (ref 135–145)

## 2021-03-28 LAB — CBC
HCT: 27.5 % — ABNORMAL LOW (ref 39.0–52.0)
Hemoglobin: 9.1 g/dL — ABNORMAL LOW (ref 13.0–17.0)
MCH: 31.2 pg (ref 26.0–34.0)
MCHC: 33.1 g/dL (ref 30.0–36.0)
MCV: 94.2 fL (ref 80.0–100.0)
Platelets: 144 10*3/uL — ABNORMAL LOW (ref 150–400)
RBC: 2.92 MIL/uL — ABNORMAL LOW (ref 4.22–5.81)
RDW: 14.5 % (ref 11.5–15.5)
WBC: 9.1 10*3/uL (ref 4.0–10.5)
nRBC: 0 % (ref 0.0–0.2)

## 2021-03-28 LAB — URINE CULTURE: Culture: NO GROWTH

## 2021-03-28 LAB — MRSA NEXT GEN BY PCR, NASAL: MRSA by PCR Next Gen: NOT DETECTED

## 2021-03-28 NOTE — Plan of Care (Signed)
°  Problem: Clinical Measurements: Goal: Ability to maintain clinical measurements within normal limits will improve Outcome: Progressing Goal: Will remain free from infection Outcome: Progressing Goal: Diagnostic test results will improve Outcome: Progressing Goal: Respiratory complications will improve Outcome: Progressing Goal: Cardiovascular complication will be avoided Outcome: Progressing   Problem: Activity: Goal: Risk for activity intolerance will decrease Outcome: Progressing   Problem: Coping: Goal: Level of anxiety will decrease Outcome: Progressing   Problem: Safety: Goal: Ability to remain free from injury will improve Outcome: Progressing   Problem: Skin Integrity: Goal: Risk for impaired skin integrity will decrease Outcome: Progressing

## 2021-03-28 NOTE — Evaluation (Signed)
Clinical/Bedside Swallow Evaluation Patient Details  Name: Jarry Manon. MRN: [id number] Date of Birth: Dec 27, 1928  Today's Date: 03/28/2021 Time: SLP Start Time (ACUTE ONLY): 57 SLP Stop Time (ACUTE ONLY): 1253 SLP Time Calculation (min) (ACUTE ONLY): 20 min  Past Medical History:  Past Medical History:  Diagnosis Date   Allergy    Arthritis    CHF (congestive heart failure) (HCC)    Chicken pox    Cholecystitis    Colon polyps    COPD (chronic obstructive pulmonary disease) (HCC)    Coronary artery disease    Diabetes mellitus without complication (HCC)    Emphysema of lung (HCC)    GERD (gastroesophageal reflux disease)    Heart murmur    Hematemesis/vomiting blood 04/10/11   Hypercholesterolemia    Mild hypertension    Pancreatitis    Prostate cancer ([place])    Prostate cancer ([place])    radiation therapy    Smoker    Ulcer    Upper GI bleed 1980   Past Surgical History:  Past Surgical History:  Procedure Laterality Date   APPENDECTOMY     CARDIAC CATHETERIZATION  May 2002 and Feb 2013   [place]; no stents    CATARACT EXTRACTION     CHOLECYSTECTOMY     CIRCUMCISION     COLONOSCOPY     HEMORRHOID SURGERY     SP CHOLECYSTOMY     STOMACH SURGERY     bleeding ulcers, followed by Dr. [name]   HPI:  Per 26 H&P "[name] is a 87 y.o. Caucasian male with medical history significant for CHF, COPD, coronary artery disease, type 2 diabetes mellitus, emphysema and GERD, hypertension and dyslipidemia, who presented to the ER with acute onset of generalized weakness and malaise and inability to open the left eye.  He was noted to be unable to move his left eye immediately.  No blurred vision or diplopia.  No paresthesias or focal muscle weakness.  No headache or dizziness.  No vertigo or tinnitus.  He has been having visual hallucinations and has been trying to hold to things in the ER.  No dysuria, oliguria or hematuria or flank pain.     ED Course: When he  came to the ER vital signs were within normal.  Labs revealed hyponatremia 127 with hypochloremia of 93.  Blood glucose was 184 and BUN 25.  Alk phos was significantly elevated at 517 with albumin of 2.8, AST of 76 and ALT of 116 with protein of 6.1 and lactic acid was 1.4.  CBC showed WBC of 10.7 and anemia close to baseline.     Imaging: Noncontrast head CT scan revealed chronic atrophic and ischemic changes without acute abnormality.  CT of the orbits showed no acute process or evidence of orbital cellulitis.  Brain MRI without contrast showed no acute abnormalities.  He had an MRA of the head and neck which revealed the following:  1. The right vertebral artery is not well visualized for the  entirety of its course, both extra cranially and intracranially,  likely occluded although slow flow or severe stenosis can appear  similar. Consider CTA head and neck for further evaluation.  2. Fusiform dilatation of the right cavernous ICA.  3. 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  4. Focal stenosis in the left P1 and left A1. Narrowing is also  noted at the origin of the right P3 branch.  5. The origin of the left vertebral  artery and left common carotid  artery are not well visualized, secondary to artifact.    The patient was given a bolus of 500 mL IV normal saline.  He will be admitted to a medical telemetry bed for further evaluation and management."    Assessment / Plan / Recommendation  Clinical Impression  Pt seen for clincial swallowing evaluation. Consult received after cognitive/language screening this AM.  Pt alert, pleasant, and cooperative. Moments of confusion noted. L eye closed throughout evaluation, ?visual deficits. Mitt on L hand upon SLP entrance to room. On 2L/min O2 via [place]. Xerostomia noted.    Per chart review, temp and WBC WNL. CXR 03/26/21 "Interval development of small bilateral pleural effusions with associated left basilar  atelectasis."  Pt given trials of ice, water, pudding, and solid. Pt presents with s/sx mild-moderate oral dysphagia c/b prolonged mastication with lack of rotary jaw movement ("munching" pattern of mastication). Trace-mild oral residual which cleared with liquid wash. No overt s/sx pharyngeal dysphagia noted across trials. To palpation, seemingly timely swallow initiation and seemingly adequate laryngeal elevation. No change to vocal quality across trials.   Recommend initiation of a Dysphagia 2 Diet with Thin Liquids and safe swallowing strategies/aspiration precautions as outlined below. Pt is at mildly increased risk of aspiration/aspiration PNA given AMS, multiple medical comorbidites, need for assistance with feeding, and respiratory status. Risk appears to reduce with diet modifications and aspiration precautions/safe swallowing strategies.   SLP to f/u per POC for diet tolerance and trials of upgraded textures, as appropriate.   Pt, RN, and MD made aware of diet recommendations, safe swallowing strategies/aspiration precautions, and SLP POC. ?full understanding by pt. Reinforcement of content may be needed by medical team.  SLP Visit Diagnosis: Dysphagia, oral phase (R13.11)    Aspiration Risk  Mild aspiration risk    Diet Recommendation Dysphagia 2 (Fine chop);Thin liquid   Medication Administration: Crushed with puree Supervision: Staff to assist with self feeding;Full supervision/cueing for compensatory strategies Compensations: Minimize environmental distractions;Slow rate;Small sips/bites;Follow solids with liquid Postural Changes: Seated upright at 90 degrees;Remain upright for at least 30 minutes after po intake    Other  Recommendations Oral Care Recommendations: Oral care QID;Staff/trained caregiver to provide oral care    Recommendations for follow up therapy are one component of a multi-disciplinary discharge planning process, led by the attending physician.   Recommendations may be updated based on patient status, additional functional criteria and insurance authorization.  Follow up Recommendations Skilled nursing-short term rehab (<3 hours/day)      Assistance Recommended at Discharge Frequent or constant Supervision/Assistance  Functional Status Assessment Patient has had a recent decline in their functional status and demonstrates the ability to make significant improvements in function in a reasonable and predictable amount of time.  Frequency and Duration min 2x/week  2 weeks       Prognosis Prognosis for Safe Diet Advancement: Fair Barriers to Reach Goals: Severity of deficits      Swallow Study   General Date of Onset: 03/26/21 HPI: Per Physician's H&P "Winford Hehn. is a 87 y.o. Caucasian male with medical history significant for CHF, COPD, coronary artery disease, type 2 diabetes mellitus, emphysema and GERD, hypertension and dyslipidemia, who presented to the ER with acute onset of generalized weakness and malaise and inability to open the left eye.  He was noted to be unable to move his left eye immediately.  No blurred vision or diplopia.  No paresthesias or focal muscle weakness.  No headache or dizziness.  No vertigo or tinnitus.  He has been having visual hallucinations and has been trying to hold to things in the ER.  No dysuria, oliguria or hematuria or flank pain.     ED Course: When he came to the ER vital signs were within normal.  Labs revealed hyponatremia 127 with hypochloremia of 93.  Blood glucose was 184 and BUN 25.  Alk phos was significantly elevated at 517 with albumin of 2.8, AST of 76 and ALT of 116 with protein of 6.1 and lactic acid was 1.4.  CBC showed WBC of 10.7 and anemia close to baseline.     Imaging: Noncontrast head CT scan revealed chronic atrophic and ischemic changes without acute abnormality.  CT of the orbits showed no acute process or evidence of orbital cellulitis.  Brain MRI without contrast showed  no acute abnormalities.  He had an MRA of the head and neck which revealed the following:  1. The right vertebral artery is not well visualized for the  entirety of its course, both extra cranially and intracranially,  likely occluded although slow flow or severe stenosis can appear  similar. Consider CTA head and neck for further evaluation.  2. Fusiform dilatation of the right cavernous ICA.  3. 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  4. Focal stenosis in the left P1 and left A1. Narrowing is also  noted at the origin of the right P3 branch.  5. The origin of the left vertebral artery and left common carotid  artery are not well visualized, secondary to artifact.    The patient was given a bolus of 500 mL IV normal saline.  He will be admitted to a medical telemetry bed for further evaluation and management." Type of Study: Bedside Swallow Evaluation Previous Swallow Assessment: unknown Diet Prior to this Study:  (passed dysphagia screening; no diet in EMR) Temperature Spikes Noted: No Respiratory Status: Nasal cannula (2L/min) History of Recent Intubation: No Behavior/Cognition: Alert;Cooperative;Pleasant mood (mild confusion noted) Oral Cavity Assessment: Within Functional Limits Oral Care Completed by SLP: Yes Oral Cavity - Dentition: Dentures, top;Dentures, bottom Vision:  (attempting for feed self, however, difficulty noted; pt kep L eye closed, ?visual deficits) Patient Positioning: Upright in bed Baseline Vocal Quality: Normal Volitional Cough: Strong Volitional Swallow: Able to elicit    Oral/Motor/Sensory Function Overall Oral Motor/Sensory Function: Within functional limits   Ice Chips Ice chips: Within functional limits Presentation: Spoon   Thin Liquid Thin Liquid: Within functional limits Presentation: Cup;Spoon Other Comments: ~6 oz    Puree Puree: Within functional limits Presentation: Self Fed Other Comments: ~3 oz   Solid      Solid: Impaired Oral Phase Impairments: Reduced lingual movement/coordination;Impaired mastication Oral Phase Functional Implications: Impaired mastication (lack of rotary jaw movement)     Cherrie Gauze, M.S., [facility] [phone] (ASCOM)   Clearnce Sorrel Clarissia Mckeen 03/28/2021,1:46 PM

## 2021-03-28 NOTE — Progress Notes (Signed)
SLP Cancellation Note  Patient Details Name: Matthew Tieu. MRN: [id number] DOB: 1929-01-14   Cancelled treatment:       Reason Eval/Treat Not Completed: SLP screened, no needs identified, will sign off. Chart review completed. Pt admitted from SNF. SLP consult received for cognitive/language evaluation. Per most recent Hospitalist note, pt "appears to be at baselien this morning, alert and oriented..." SLP to defer cognitive-linguistic evaluation at this level of care. Recommend SLP evaluation in home/functional environment (e.g. at SNF) at d/c.   SLP to sign off as pt has no acute SLP needs at this time.  RN made aware.   Cherrie Gauze, M.S., [facility] [phone] [place])  Quintella Baton 03/28/2021, 11:29 AM

## 2021-03-28 NOTE — Progress Notes (Signed)
Progress Note    Matthew Miles.  HDQ:[id number] DOB: [birth year] DOA: 03/26/2021 PCP: [name], MD   Brief Narrative:  Matthew Miles. is a 87 y.o. Caucasian male with medical history significant for CHF, COPD, coronary artery disease, type 2 diabetes mellitus, emphysema and GERD, hypertension and dyslipidemia, prostate cancer undergoing evaluation at [facility] who presented to the ER with acute onset of generalized weakness and malaise and inability to open the left eye. Neuro and optho following - appreciate insight/recs.  Assessment & Plan:  Altered mental status with mild delirium and visual hallucinations secondary to prolonged hospitalization, improving Questionable Wernicke encephalopathy - Appears to be back to baseline this morning, alert and oriented - Hospital delirium from previous prolonged hospitalization/SNF placement - problem sleeping at [facility] last week - Stop oxycodone/trazodone/haldol -Neurology initiating high-dose thiamine given questionable Warnicke's encephalopathy  Left Internuclear Ophthalmoplegia, POA - Likely secondary to vertebrobasilar insufficiency on MRA without overt aneurysm or stroke - Ophthalmology and neurology following, appreciate insight and recommendations - Continue conservative management -outpatient follow-up as scheduled  Dehydration, hypovolemic hyponatremia. - Continue IV fluids, follow repeat labs  BPH. - Continue Uroxatrol and Proscar.  Non insulin-dependent diabetes type 2  - Continue sliding scale insulin, hold home metformin  Sacral decubitus ulcer, POA - See nursing/WOC documentation  Dyslipidemia. - We will continue statin therapy and check fasting lipids.  DVT prophylaxis: Lovenox. Code Status: full code. Family Communication: Son updated over phone  Status is: inpt  Dispo: The patient is from: SNF              Anticipated d/c is to: SNF              Anticipated d/c date is: 24-48h              Patient  currently NOT medically stable for discharge  Consultants:  Neuro, Optho  Procedures:  None  Antimicrobials:  None   Subjective: No acute issues/events overnight  Objective: Vitals:   03/27/21 1900 03/27/21 2043 03/28/21 0000 03/28/21 0354  BP: (!) 136/55 (!) 121/53 117/61 (!) 119/52  Pulse: 79 74 77 77  Resp: 20 17 18 16   Temp:  98 F (36.7 C) 97.9 F (36.6 C) 97.9 F (36.6 C)  TempSrc:  Oral Oral Oral  SpO2: 95% 94% 97% 97%  Weight:      Height:        Intake/Output Summary (Last 24 hours) at 03/28/2021 0700 Last data filed at 03/28/2021 0600 Gross per 24 hour  Intake 2488.09 ml  Output 300 ml  Net 2188.09 ml    Filed Weights   03/26/21 1645  Weight: 76.7 kg    Examination:  General exam: Appears calm and comfortable, oriented to person place and general situation(understands to the hospital, not at [facility]) Respiratory system: Clear to auscultation. Respiratory effort normal. Cardiovascular system: S1 & S2 heard, RRR. No JVD, murmurs, rubs, gallops or clicks. No pedal edema. Gastrointestinal system: Abdomen is nondistended, soft and nontender. No organomegaly or masses felt. Normal bowel sounds heard. Central nervous system: Alert and oriented to self only, nerve palsy/limited vision as above Extremities: Symmetric 5 x 5 power. Skin: No rashes, lesions; sacral decubitus ulcer (see above) Psychiatry: Unable to assess given mental status  Data Reviewed: I have personally reviewed following labs and imaging studies  CBC: Recent Labs  Lab 03/26/21 1641 03/28/21 0451  WBC 10.7* 9.1  NEUTROABS 8.7*  --   HGB 10.4* 9.1*  HCT 31.1* 27.5*  MCV  95.4 94.2  PLT 161 144*    Basic Metabolic Panel: Recent Labs  Lab 03/26/21 1641 03/28/21 0451  NA 127* 131*  K 4.1 3.4*  CL 93* 97*  CO2 27 25  GLUCOSE 184* 165*  BUN 25* 21  CREATININE 1.00 1.02  CALCIUM 8.3* 7.8*    GFR: Estimated Creatinine Clearance: 45.1 mL/min (by C-G formula based on SCr of  1.02 mg/dL). Liver Function Tests: Recent Labs  Lab 03/26/21 1646  AST 76*  ALT 116*  ALKPHOS 517*  BILITOT 0.8  PROT 6.1*  ALBUMIN 2.8*    No results for input(s): LIPASE, AMYLASE in the last 168 hours. No results for input(s): AMMONIA in the last 168 hours. Coagulation Profile: No results for input(s): INR, PROTIME in the last 168 hours. Cardiac Enzymes: No results for input(s): CKTOTAL, CKMB, CKMBINDEX, TROPONINI in the last 168 hours. BNP (last 3 results) No results for input(s): PROBNP in the last 8760 hours. HbA1C: No results for input(s): HGBA1C in the last 72 hours. CBG: No results for input(s): GLUCAP in the last 168 hours. Lipid Profile: Recent Labs    03/27/21 0517  CHOL 132  HDL 24*  LDLCALC 74  TRIG 168*  CHOLHDL 5.5    Thyroid Function Tests: No results for input(s): TSH, T4TOTAL, FREET4, T3FREE, THYROIDAB in the last 72 hours. Anemia Panel: No results for input(s): VITAMINB12, FOLATE, FERRITIN, TIBC, IRON, RETICCTPCT in the last 72 hours. Sepsis Labs: Recent Labs  Lab 03/26/21 1650  LATICACIDVEN 1.4     Recent Results (from the past 240 hour(s))  Culture, blood (routine x 2)     Status: None (Preliminary result)   Collection Time: 03/26/21  6:15 PM   Specimen: BLOOD  Result Value Ref Range Status   Specimen Description BLOOD LEFT ANTECUBITAL  Final   Special Requests   Final    BOTTLES DRAWN AEROBIC AND ANAEROBIC Blood Culture adequate volume   Culture   Final    NO GROWTH 2 DAYS Performed at [facility], [address], [place], [place] [address]    Report Status PENDING  Incomplete  Culture, blood (routine x 2)     Status: None (Preliminary result)   Collection Time: 03/26/21  6:22 PM   Specimen: BLOOD  Result Value Ref Range Status   Specimen Description BLOOD BLOOD LEFT HAND  Final   Special Requests   Final    BOTTLES DRAWN AEROBIC AND ANAEROBIC Blood Culture adequate volume   Culture   Final    NO GROWTH 2  DAYS Performed at [facility], [address] [place], [place] [address]    Report Status PENDING  Incomplete  Resp Panel by RT-PCR (Flu A&B, Covid) Nasopharyngeal Swab     Status: None   Collection Time: 03/27/21 12:02 AM   Specimen: Nasopharyngeal Swab; Nasopharyngeal(NP) swabs in vial transport medium  Result Value Ref Range Status   SARS Coronavirus 2 by RT PCR NEGATIVE NEGATIVE Final    Comment: (NOTE) SARS-CoV-2 target nucleic acids are NOT DETECTED.  The SARS-CoV-2 RNA is generally detectable in upper respiratory specimens during the acute phase of infection. The lowest concentration of SARS-CoV-2 viral copies this assay can detect is 138 copies/mL. A negative result does not preclude SARS-Cov-2 infection and should not be used as the sole basis for treatment or other patient management decisions. A negative result may occur with  improper specimen collection/handling, submission of specimen other than nasopharyngeal swab, presence of viral mutation(s) within the areas targeted  by this assay, and inadequate number of viral copies(<138 copies/mL). A negative result must be combined with clinical observations, patient history, and epidemiological information. The expected result is Negative.  Fact Sheet for Patients:  EntrepreneurPulse.com.au  Fact Sheet for Healthcare Providers:  IncredibleEmployment.be  This test is no t yet approved or cleared by the [place] FDA and  has been authorized for detection and/or diagnosis of SARS-CoV-2 by FDA under an Emergency Use Authorization (EUA). This EUA will remain  in effect (meaning this test can be used) for the duration of the COVID-19 declaration under Section 564(b)(1) of the Act, 21 U.S.C.section 360bbb-3(b)(1), unless the authorization is terminated  or revoked sooner.       Influenza A by PCR NEGATIVE NEGATIVE Final   Influenza B by PCR NEGATIVE NEGATIVE Final     Comment: (NOTE) The Xpert Xpress SARS-CoV-2/FLU/RSV plus assay is intended as an aid in the diagnosis of influenza from Nasopharyngeal swab specimens and should not be used as a sole basis for treatment. Nasal washings and aspirates are unacceptable for Xpert Xpress SARS-CoV-2/FLU/RSV testing.  Fact Sheet for Patients: EntrepreneurPulse.com.au  Fact Sheet for Healthcare Providers: IncredibleEmployment.be  This test is not yet approved or cleared by the [place] FDA and has been authorized for detection and/or diagnosis of SARS-CoV-2 by FDA under an Emergency Use Authorization (EUA). This EUA will remain in effect (meaning this test can be used) for the duration of the COVID-19 declaration under Section 564(b)(1) of the Act, 21 U.S.C. section 360bbb-3(b)(1), unless the authorization is terminated or revoked.  Performed at [facility], [address] [place], [place] [address]   MRSA Next Gen by PCR, Nasal     Status: None   Collection Time: 03/27/21 11:19 PM   Specimen: Nasal Mucosa; Nasal Swab  Result Value Ref Range Status   MRSA by PCR Next Gen NOT DETECTED NOT DETECTED Final    Comment: (NOTE) The GeneXpert MRSA Assay (FDA approved for NASAL specimens only), is one component of a comprehensive MRSA colonization surveillance program. It is not intended to diagnose MRSA infection nor to guide or monitor treatment for MRSA infections. Test performance is not FDA approved in patients less than 20 years old. Performed at [facility], [address], [place], [place] [address]           Radiology Studies: DG Chest 2 View  Result Date: 03/26/2021 CLINICAL DATA:  Altered mental status EXAM: CHEST - 2 VIEW COMPARISON:  03/04/2021 FINDINGS: Lung volumes are small. Small bilateral pleural effusions have developed since prior examination with associated left basilar compressive atelectasis. No pneumothorax. Cardiac  size within normal limits. Pulmonary vascularity is normal. No acute bone abnormality. IMPRESSION: Interval development of small bilateral pleural effusions with associated left basilar atelectasis. Electronically Signed   By: Fidela Salisbury M.D.   On: 03/26/2021 23:53   CT Head Wo Contrast  Result Date: 03/26/2021 CLINICAL DATA:  Left eye droop EXAM: CT HEAD WITHOUT CONTRAST TECHNIQUE: Contiguous axial images were obtained from the base of the skull through the vertex without intravenous contrast. COMPARISON:  03/26/2021 FINDINGS: Brain: Mild atrophic changes and chronic white matter ischemic changes are identified. A few small lacunar infarcts are noted within the basal ganglia on the right stable in appearance from the prior exam. No acute hemorrhage or acute infarction is seen. Vascular: No hyperdense vessel or unexpected calcification. Skull: Normal. Negative for fracture or focal lesion. Sinuses/Orbits: No acute finding. Other: None. IMPRESSION: Chronic atrophic and ischemic  changes without acute abnormality. Electronically Signed   By: Inez Catalina M.D.   On: 03/26/2021 21:21   MR ANGIO HEAD WO CONTRAST  Result Date: 03/27/2021 CLINICAL DATA:  Neuro deficit, stroke suspected, left third nerve palsy, concern for aneurysm EXAM: MRA NECK WITHOUT AND WITH CONTRAST MRA HEAD WITHOUT CONTRAST TECHNIQUE: Multiplanar and multiecho pulse sequences of the neck were obtained without and with intravenous contrast. Angiographic images of the neck were obtained using MRA technique without and with intravenous contrast; Angiographic images of the Circle of Willis were obtained using MRA technique without intravenous contrast. CONTRAST:  60mL GADAVIST GADOBUTROL 1 MMOL/ML IV SOLN COMPARISON:  None. FINDINGS: MRA NECK FINDINGS Three-vessel aortic arch. The proximal left common carotid is not well evaluated due to artifact. No evidence of aortic aneurysm or dissection. No evidence of hemodynamically significant  stenosis (greater than 50%), dissection, or occlusion in the bilateral common and internal carotid arteries. The right vertebral artery is not well visualized for the entirety of its course, which may indicate slow flow or stenosis. The origin of the left vertebral artery is not well visualized, secondary to artifact. The remainder of the left vertebral artery is patent, without significant stenosis (greater than 50%), dissection, or occlusion. MRA HEAD FINDINGS Both internal carotid arteries are patent to the termini, without stenosis. Somewhat fusiform dilatation of the right cavernous ICA (series 9, image 95). Focal stenosis of the origin of the left A1 (series 9, image 107). Normal right A1. Anterior cerebral arteries are patent to their distal aspects. No M1 stenosis or occlusion. Normal MCA bifurcations. Distal MCA branches perfused and relatively symmetric. The right vertebral artery is not visualized. The left vertebral artery is patent to the vertebrobasilar junction. The basilar artery demonstrates focal loss of signal (series 9, image 83), likely severe focal stenosis or occlusion. Focal narrowing is also noted in the right P1 segment, with a patent right posterior communicating artery. The left P1 is not visualized. Patent left posterior communicating artery with fetal origin of the left PCA. Focal narrowing at the origin of the right P3 branch (series 9, image 131). PCAs are grossly patent to their distal aspects. IMPRESSION: 1. The right vertebral artery is not well visualized for the entirety of its course, both extra cranially and intracranially, likely occluded although slow flow or severe stenosis can appear similar. Consider CTA head and neck for further evaluation. 2. Fusiform dilatation of the right cavernous ICA. 3. 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 4. Focal stenosis in the left P1 and left A1. Narrowing is also noted at the origin of the right P3  branch. 5. The origin of the left vertebral artery and left common carotid artery are not well visualized, secondary to artifact. Electronically Signed   By: Merilyn Baba M.D.   On: 03/27/2021 02:19   MR Angiogram Neck W or Wo Contrast  Result Date: 03/27/2021 CLINICAL DATA:  Neuro deficit, stroke suspected, left third nerve palsy, concern for aneurysm EXAM: MRA NECK WITHOUT AND WITH CONTRAST MRA HEAD WITHOUT CONTRAST TECHNIQUE: Multiplanar and multiecho pulse sequences of the neck were obtained without and with intravenous contrast. Angiographic images of the neck were obtained using MRA technique without and with intravenous contrast; Angiographic images of the Circle of Willis were obtained using MRA technique without intravenous contrast. CONTRAST:  18mL GADAVIST GADOBUTROL 1 MMOL/ML IV SOLN COMPARISON:  None. FINDINGS: MRA NECK FINDINGS Three-vessel aortic arch. The proximal left common carotid is not well evaluated due  to artifact. No evidence of aortic aneurysm or dissection. No evidence of hemodynamically significant stenosis (greater than 50%), dissection, or occlusion in the bilateral common and internal carotid arteries. The right vertebral artery is not well visualized for the entirety of its course, which may indicate slow flow or stenosis. The origin of the left vertebral artery is not well visualized, secondary to artifact. The remainder of the left vertebral artery is patent, without significant stenosis (greater than 50%), dissection, or occlusion. MRA HEAD FINDINGS Both internal carotid arteries are patent to the termini, without stenosis. Somewhat fusiform dilatation of the right cavernous ICA (series 9, image 95). Focal stenosis of the origin of the left A1 (series 9, image 107). Normal right A1. Anterior cerebral arteries are patent to their distal aspects. No M1 stenosis or occlusion. Normal MCA bifurcations. Distal MCA branches perfused and relatively symmetric. The right vertebral artery  is not visualized. The left vertebral artery is patent to the vertebrobasilar junction. The basilar artery demonstrates focal loss of signal (series 9, image 83), likely severe focal stenosis or occlusion. Focal narrowing is also noted in the right P1 segment, with a patent right posterior communicating artery. The left P1 is not visualized. Patent left posterior communicating artery with fetal origin of the left PCA. Focal narrowing at the origin of the right P3 branch (series 9, image 131). PCAs are grossly patent to their distal aspects. IMPRESSION: 1. The right vertebral artery is not well visualized for the entirety of its course, both extra cranially and intracranially, likely occluded although slow flow or severe stenosis can appear similar. Consider CTA head and neck for further evaluation. 2. Fusiform dilatation of the right cavernous ICA. 3. 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 4. Focal stenosis in the left P1 and left A1. Narrowing is also noted at the origin of the right P3 branch. 5. The origin of the left vertebral artery and left common carotid artery are not well visualized, secondary to artifact. Electronically Signed   By: Merilyn Baba M.D.   On: 03/27/2021 02:19   MR BRAIN WO CONTRAST  Result Date: 03/26/2021 CLINICAL DATA:  Neuro deficit, stroke suspected, loss of vision in left eye EXAM: MRI HEAD WITHOUT CONTRAST TECHNIQUE: Multiplanar, multiecho pulse sequences of the brain and surrounding structures were obtained without intravenous contrast. COMPARISON:  No prior MRI, correlation is made with CT head 03/26/2021 FINDINGS: Brain: No restricted diffusion to suggest acute or subacute infarct. No acute hemorrhage, mass, mass effect, or midline shift. Lacunar infarcts in the right basal ganglia. T2 hyperintense signal in the periventricular white matter, likely the sequela of chronic small vessel ischemic disease. No hydrocephalus or extra-axial  collection. Focus of hemosiderin deposition in the right medial temporal lobe, likely sequela of prior hypertensive microhemorrhage. Vascular: Normal flow voids. Skull and upper cervical spine: Normal marrow signal. Sinuses/Orbits: Negative.  Status post right lens replacement. Other: Fluid in the bilateral mastoid air cells. IMPRESSION: No acute intracranial process. Electronically Signed   By: Merilyn Baba M.D.   On: 03/26/2021 23:50   CT Orbits W Contrast  Result Date: 03/26/2021 CLINICAL DATA:  Orbital cellulitis suspected, left eyelid droop EXAM: CT ORBITS WITH CONTRAST TECHNIQUE: Multidetector CT images was performed according to the standard protocol following intravenous contrast administration. CONTRAST:  35mL OMNIPAQUE IOHEXOL 300 MG/ML  SOLN COMPARISON:  No prior CT orbits, correlation is made with 03/07/2021 CT head FINDINGS: Orbits: No orbital mass or evidence of inflammation. Normal appearance of the  globes, optic nerve-sheath complexes, extraocular muscles, orbital fat and lacrimal glands. Status post right lens replacement. Visible paranasal sinuses: Minimal mucosal thickening, grossly clear. Soft tissues: Normal. Osseous: No fracture or aggressive lesion. Limited intracranial: No acute or significant finding. IMPRESSION: No acute process in the orbits.  No evidence of orbital cellulitis. Electronically Signed   By: Merilyn Baba M.D.   On: 03/26/2021 19:28        Scheduled Meds:  alfuzosin  10 mg Oral Q breakfast   ascorbic acid  500 mg Oral BID   aspirin EC  81 mg Oral Daily   carvedilol  12.5 mg Oral BID WC   Chlorhexidine Gluconate Cloth  6 each Topical Daily   enoxaparin (LOVENOX) injection  40 mg Subcutaneous Q24H   ferrous sulfate  325 mg Oral Daily   finasteride  5 mg Oral Daily   fluconazole  100 mg Oral Daily   fluticasone  2 spray Each Nare Daily   lidocaine  1 patch Transdermal Q24H   mouth rinse  15 mL Mouth Rinse BID   melatonin  2.5 mg Oral QHS   multivitamin  with minerals  1 tablet Oral Daily   pantoprazole  20 mg Oral Daily   pravastatin  40 mg Oral Q2000   tiotropium  1 capsule Inhalation Daily   torsemide  20 mg Oral Daily   valACYclovir  500 mg Oral BID   zinc sulfate  220 mg Oral Daily   Continuous Infusions:  sodium chloride 100 mL/hr at 03/27/21 2301   thiamine injection 500 mg (03/27/21 2306)    LOS: 1 day   Time spent: 46min  Mayah Urquidi C Brinnley Lacap, DO [facility]  If 7PM-7AM, please contact night-coverage [email or web address]  03/28/2021, 7:00 AM

## 2021-03-28 NOTE — Progress Notes (Signed)
Subjective: Laying comfortably in bed.   Objective: Current vital signs: BP 140/66 (BP Location: Right Leg)    Pulse 79    Temp 98.2 F (36.8 C)    Resp 18    Ht 5\' 4"  (1.626 m)    Wt 76.7 kg    SpO2 (!) 89%    BMI 29.01 kg/m  Vital signs in last 24 hours: Temp:  [97.8 F (36.6 C)-98.5 F (36.9 C)] 98.2 F (36.8 C) (12/18 1216) Pulse Rate:  [74-88] 79 (12/18 1216) Resp:  [16-23] 18 (12/18 1216) BP: (117-140)/(52-66) 140/66 (12/18 1216) SpO2:  [89 %-97 %] 89 % (12/18 1216)  Intake/Output from previous day: 12/17 0701 - 12/18 0700 In: 2488.1 [I.V.:2438.1; IV Piggyback:50] Out: 300 [Urine:300] Intake/Output this shift: Total I/O In: -  Out: 250 [Urine:250] Nutritional status:  Diet Order             DIET DYS 2 Room service appropriate? Yes with Assist; Fluid consistency: Thin  Diet effective now                   Physical Exam  HEENT-  [place]/AT Lungs- Respirations unlabored Extremities- Warm and well perfused    Neurological Examination Mental Status: Awakens easily to an alert state, but with decreased attention.  Speech is sparse, but fluent and nondysarthric. He has poor insight. No evidence for formed visual hallucinations at the time of bedside exam today.   Cranial Nerves: II: PERRL. Visual fields intacl.  III,IV, VI:  - Severe left ptosis is unchanged from yesterday. Right eyelid is normal.  - Inability to make eye contact or fixate directly on objects, but can count fingers and name objects visually, using each eye individually. - Right eye with paresis in all 4 directions, worse with abduction and least compromised with adduction.  - Left eye with paresis in all 4 directions of gaze, worse overall than right eye and most pronounced deficit with left eye adduction (medial deviation), but is able to cross midline to the right. - Intact oculocephalic reflexes, showing normalized eye motility relative to volitional movements. Oculocephalics are unchanged from  yesterday.  - No nystagmus.  - Exotropia noted when gaze is at midposition. - Volitional ocular motility is overall improved from yesterday. VII: Face symmetric VIII: Mildly HOH  IX,X: No hypophonia or hoarseness XI: Head is midline.  Motor: RUE 4+/5 LUE 4+/5 RLE 2/5 hip flexion, 4-/5 knee extension and knee flexion LLE 3/5 hip flexion, 4/5 knee extension and knee flexion, ADF and APF 4/5 Sensory: FT intact x 4.   Deep Tendon Reflexes: Unchanged Cerebellar: Past-pointing is severe bilaterally, with slow movements. Patient noted not to be looking directly at examiner's finger when attempting to touch. Exam performed with one eye open only on each side, with same results.  Gait: Deferred  Lab Results: Results for orders placed or performed during the hospital encounter of 03/26/21 (from the past 48 hour(s))  CBC with Differential     Status: Abnormal   Collection Time: 03/26/21  4:41 PM  Result Value Ref Range   WBC 10.7 (H) 4.0 - 10.5 K/uL   RBC 3.26 (L) 4.22 - 5.81 MIL/uL   Hemoglobin 10.4 (L) 13.0 - 17.0 g/dL   HCT 31.1 (L) 39.0 - 52.0 %   MCV 95.4 80.0 - 100.0 fL   MCH 31.9 26.0 - 34.0 pg   MCHC 33.4 30.0 - 36.0 g/dL   RDW 14.0 11.5 - 15.5 %   Platelets 161 150 -  400 K/uL   nRBC 0.0 0.0 - 0.2 %   Neutrophils Relative % 81 %   Neutro Abs 8.7 (H) 1.7 - 7.7 K/uL   Lymphocytes Relative 8 %   Lymphs Abs 0.8 0.7 - 4.0 K/uL   Monocytes Relative 9 %   Monocytes Absolute 1.0 0.1 - 1.0 K/uL   Eosinophils Relative 1 %   Eosinophils Absolute 0.1 0.0 - 0.5 K/uL   Basophils Relative 0 %   Basophils Absolute 0.0 0.0 - 0.1 K/uL   Immature Granulocytes 1 %   Abs Immature Granulocytes 0.09 (H) 0.00 - 0.07 K/uL    Comment: Performed at [facility], [address] [place], [place] [address]  Basic metabolic panel     Status: Abnormal   Collection Time: 03/26/21  4:41 PM  Result Value Ref Range   Sodium 127 (L) 135 - 145 mmol/L   Potassium 4.1 3.5 - 5.1 mmol/L   Chloride  93 (L) 98 - 111 mmol/L   CO2 27 22 - 32 mmol/L   Glucose, Bld 184 (H) 70 - 99 mg/dL    Comment: Glucose reference range applies only to samples taken after fasting for at least 8 hours.   BUN 25 (H) 8 - 23 mg/dL   Creatinine, Ser 1.00 0.61 - 1.24 mg/dL   Calcium 8.3 (L) 8.9 - 10.3 mg/dL   GFR, Estimated >60 >60 mL/min    Comment: (NOTE) Calculated using the CKD-EPI Creatinine Equation (2021)    Anion gap 7 5 - 15    Comment: Performed at [facility], [address] [place], [place] [address]  Hepatic function panel     Status: Abnormal   Collection Time: 03/26/21  4:46 PM  Result Value Ref Range   Total Protein 6.1 (L) 6.5 - 8.1 g/dL   Albumin 2.8 (L) 3.5 - 5.0 g/dL   AST 76 (H) 15 - 41 U/L   ALT 116 (H) 0 - 44 U/L   Alkaline Phosphatase 517 (H) 38 - 126 U/L   Total Bilirubin 0.8 0.3 - 1.2 mg/dL   Bilirubin, Direct 0.2 0.0 - 0.2 mg/dL   Indirect Bilirubin 0.6 0.3 - 0.9 mg/dL    Comment: Performed at [facility], [address] [place], [place] [address]  Lactic acid, plasma     Status: None   Collection Time: 03/26/21  4:50 PM  Result Value Ref Range   Lactic Acid, Venous 1.4 0.5 - 1.9 mmol/L    Comment: Performed at [facility], [address] [place], [place] [address]  Culture, blood (routine x 2)     Status: None (Preliminary result)   Collection Time: 03/26/21  6:15 PM   Specimen: BLOOD  Result Value Ref Range   Specimen Description BLOOD LEFT ANTECUBITAL    Special Requests      BOTTLES DRAWN AEROBIC AND ANAEROBIC Blood Culture adequate volume   Culture      NO GROWTH 2 DAYS Performed at [facility], [address], [place],  [address]    Report Status PENDING   Culture, blood (routine x 2)     Status: None (Preliminary result)   Collection Time: 03/26/21  6:22 PM   Specimen: BLOOD  Result Value Ref Range   Specimen Description BLOOD BLOOD LEFT HAND    Special Requests      BOTTLES DRAWN AEROBIC AND ANAEROBIC  Blood Culture adequate volume   Culture      NO GROWTH 2 DAYS Performed  at [facility], [address] [place], [place] [address]    Report Status PENDING   Resp Panel by RT-PCR (Flu A&B, Covid) Nasopharyngeal Swab     Status: None   Collection Time: 03/27/21 12:02 AM   Specimen: Nasopharyngeal Swab; Nasopharyngeal(NP) swabs in vial transport medium  Result Value Ref Range   SARS Coronavirus 2 by RT PCR NEGATIVE NEGATIVE    Comment: (NOTE) SARS-CoV-2 target nucleic acids are NOT DETECTED.  The SARS-CoV-2 RNA is generally detectable in upper respiratory specimens during the acute phase of infection. The lowest concentration of SARS-CoV-2 viral copies this assay can detect is 138 copies/mL. A negative result does not preclude SARS-Cov-2 infection and should not be used as the sole basis for treatment or other patient management decisions. A negative result may occur with  improper specimen collection/handling, submission of specimen other than nasopharyngeal swab, presence of viral mutation(s) within the areas targeted by this assay, and inadequate number of viral copies(<138 copies/mL). A negative result must be combined with clinical observations, patient history, and epidemiological information. The expected result is Negative.  Fact Sheet for Patients:  EntrepreneurPulse.com.au  Fact Sheet for Healthcare Providers:  IncredibleEmployment.be  This test is no t yet approved or cleared by the [place] FDA and  has been authorized for detection and/or diagnosis of SARS-CoV-2 by FDA under an Emergency Use Authorization (EUA). This EUA will remain  in effect (meaning this test can be used) for the duration of the COVID-19 declaration under Section 564(b)(1) of the Act, 21 U.S.C.section 360bbb-3(b)(1), unless the authorization is terminated  or revoked sooner.       Influenza A by PCR NEGATIVE NEGATIVE   Influenza B by PCR  NEGATIVE NEGATIVE    Comment: (NOTE) The Xpert Xpress SARS-CoV-2/FLU/RSV plus assay is intended as an aid in the diagnosis of influenza from Nasopharyngeal swab specimens and should not be used as a sole basis for treatment. Nasal washings and aspirates are unacceptable for Xpert Xpress SARS-CoV-2/FLU/RSV testing.  Fact Sheet for Patients: EntrepreneurPulse.com.au  Fact Sheet for Healthcare Providers: IncredibleEmployment.be  This test is not yet approved or cleared by the [place] FDA and has been authorized for detection and/or diagnosis of SARS-CoV-2 by FDA under an Emergency Use Authorization (EUA). This EUA will remain in effect (meaning this test can be used) for the duration of the COVID-19 declaration under Section 564(b)(1) of the Act, 21 U.S.C. section 360bbb-3(b)(1), unless the authorization is terminated or revoked.  Performed at [facility], [address] [place],  [address]   Urinalysis, Routine w reflex microscopic Nasopharyngeal Swab     Status: Abnormal   Collection Time: 03/27/21 12:10 AM  Result Value Ref Range   Color, Urine YELLOW (A) YELLOW   APPearance HAZY (A) CLEAR   Specific Gravity, Urine 1.012 1.005 - 1.030   pH 5.0 5.0 - 8.0   Glucose, UA NEGATIVE NEGATIVE mg/dL   Hgb urine dipstick MODERATE (A) NEGATIVE   Bilirubin Urine NEGATIVE NEGATIVE   Ketones, ur NEGATIVE NEGATIVE mg/dL   Protein, ur NEGATIVE NEGATIVE mg/dL   Nitrite NEGATIVE NEGATIVE   Leukocytes,Ua SMALL (A) NEGATIVE   RBC / HPF 11-20 0 - 5 RBC/hpf   WBC, UA 11-20 0 - 5 WBC/hpf   Bacteria, UA RARE (A) NONE SEEN   Squamous Epithelial / LPF NONE SEEN 0 - 5   WBC Clumps PRESENT    Mucus PRESENT    Hyaline Casts, UA PRESENT     Comment: Performed  at [facility], [address] [place], [place] [address]  Urine Culture     Status: None   Collection Time: 03/27/21 12:10 AM   Specimen: Urine, Random  Result Value  Ref Range   Specimen Description      URINE, RANDOM Performed at [facility], [address] [place], [place] [address]    Special Requests      NONE Performed at [facility], [address] [place], [place] [address]    Culture      NO GROWTH Performed at [facility], [place] [address], [place], [place] [address]    Report Status 03/28/2021 FINAL   Lipid panel     Status: Abnormal   Collection Time: 03/27/21  5:17 AM  Result Value Ref Range   Cholesterol 132 0 - 200 mg/dL   Triglycerides 168 (H) <150 mg/dL   HDL 24 (L) >40 mg/dL   Total CHOL/HDL Ratio 5.5 RATIO   VLDL 34 0 - 40 mg/dL   LDL Cholesterol 74 0 - 99 mg/dL    Comment:        Total Cholesterol/HDL:CHD Risk Coronary Heart Disease Risk Table                     Men   Women  1/2 Average Risk   3.4   3.3  Average Risk       5.0   4.4  2 X Average Risk   9.6   7.1  3 X Average Risk  23.4   11.0        Use the calculated Patient Ratio above and the CHD Risk Table to determine the patient's CHD Risk.        ATP III CLASSIFICATION (LDL):  <100     mg/dL   Optimal  100-129  mg/dL   Near or Above                    Optimal  130-159  mg/dL   Borderline  160-189  mg/dL   High  >190     mg/dL   Very High Performed at [facility], [address] [place], [place] [address]   MRSA Next Gen by PCR, Nasal     Status: None   Collection Time: 03/27/21 11:19 PM   Specimen: Nasal Mucosa; Nasal Swab  Result Value Ref Range   MRSA by PCR Next Gen NOT DETECTED NOT DETECTED    Comment: (NOTE) The GeneXpert MRSA Assay (FDA approved for NASAL specimens only), is one component of a comprehensive MRSA colonization surveillance program. It is not intended to diagnose MRSA infection nor to guide or monitor treatment for MRSA infections. Test performance is not FDA approved in patients less than 26 years old. Performed at [facility], [address] [place], [place] [address]    CBC     Status: Abnormal   Collection Time: 03/28/21  4:51 AM  Result Value Ref Range   WBC 9.1 4.0 - 10.5 K/uL   RBC 2.92 (L) 4.22 - 5.81 MIL/uL   Hemoglobin 9.1 (L) 13.0 - 17.0 g/dL   HCT 27.5 (L) 39.0 - 52.0 %   MCV 94.2 80.0 - 100.0 fL   MCH 31.2 26.0 - 34.0 pg   MCHC 33.1 30.0 - 36.0 g/dL   RDW 14.5 11.5 - 15.5 %   Platelets 144 (L) 150 - 400 K/uL   nRBC 0.0 0.0 - 0.2 %  Comment: Performed at [facility], [address] [place], [place] [address]  Basic metabolic panel     Status: Abnormal   Collection Time: 03/28/21  4:51 AM  Result Value Ref Range   Sodium 131 (L) 135 - 145 mmol/L   Potassium 3.4 (L) 3.5 - 5.1 mmol/L   Chloride 97 (L) 98 - 111 mmol/L   CO2 25 22 - 32 mmol/L   Glucose, Bld 165 (H) 70 - 99 mg/dL    Comment: Glucose reference range applies only to samples taken after fasting for at least 8 hours.   BUN 21 8 - 23 mg/dL   Creatinine, Ser 1.02 0.61 - 1.24 mg/dL   Calcium 7.8 (L) 8.9 - 10.3 mg/dL   GFR, Estimated >60 >60 mL/min    Comment: (NOTE) Calculated using the CKD-EPI Creatinine Equation (2021)    Anion gap 9 5 - 15    Comment: Performed at [facility], [address] [place], [place] [address]    Recent Results (from the past 240 hour(s))  Culture, blood (routine x 2)     Status: None (Preliminary result)   Collection Time: 03/26/21  6:15 PM   Specimen: BLOOD  Result Value Ref Range Status   Specimen Description BLOOD LEFT ANTECUBITAL  Final   Special Requests   Final    BOTTLES DRAWN AEROBIC AND ANAEROBIC Blood Culture adequate volume   Culture   Final    NO GROWTH 2 DAYS Performed at [facility], [address] [place], [place] [address]    Report Status PENDING  Incomplete  Culture, blood (routine x 2)     Status: None (Preliminary result)   Collection Time: 03/26/21  6:22 PM   Specimen: BLOOD  Result Value Ref Range Status   Specimen Description BLOOD BLOOD LEFT HAND  Final   Special Requests    Final    BOTTLES DRAWN AEROBIC AND ANAEROBIC Blood Culture adequate volume   Culture   Final    NO GROWTH 2 DAYS Performed at [facility], [address] [place], [place] [address]    Report Status PENDING  Incomplete  Resp Panel by RT-PCR (Flu A&B, Covid) Nasopharyngeal Swab     Status: None   Collection Time: 03/27/21 12:02 AM   Specimen: Nasopharyngeal Swab; Nasopharyngeal(NP) swabs in vial transport medium  Result Value Ref Range Status   SARS Coronavirus 2 by RT PCR NEGATIVE NEGATIVE Final    Comment: (NOTE) SARS-CoV-2 target nucleic acids are NOT DETECTED.  The SARS-CoV-2 RNA is generally detectable in upper respiratory specimens during the acute phase of infection. The lowest concentration of SARS-CoV-2 viral copies this assay can detect is 138 copies/mL. A negative result does not preclude SARS-Cov-2 infection and should not be used as the sole basis for treatment or other patient management decisions. A negative result may occur with  improper specimen collection/handling, submission of specimen other than nasopharyngeal swab, presence of viral mutation(s) within the areas targeted by this assay, and inadequate number of viral copies(<138 copies/mL). A negative result must be combined with clinical observations, patient history, and epidemiological information. The expected result is Negative.  Fact Sheet for Patients:  EntrepreneurPulse.com.au  Fact Sheet for Healthcare Providers:  IncredibleEmployment.be  This test is no t yet approved or cleared by the [place] FDA and  has been authorized for detection and/or diagnosis of SARS-CoV-2 by FDA under an Emergency Use Authorization (EUA). This EUA will remain  in effect (meaning this test can  be used) for the duration of the COVID-19 declaration under Section 564(b)(1) of the Act, 21 U.S.C.section 360bbb-3(b)(1), unless the authorization is terminated  or revoked  sooner.       Influenza A by PCR NEGATIVE NEGATIVE Final   Influenza B by PCR NEGATIVE NEGATIVE Final    Comment: (NOTE) The Xpert Xpress SARS-CoV-2/FLU/RSV plus assay is intended as an aid in the diagnosis of influenza from Nasopharyngeal swab specimens and should not be used as a sole basis for treatment. Nasal washings and aspirates are unacceptable for Xpert Xpress SARS-CoV-2/FLU/RSV testing.  Fact Sheet for Patients: EntrepreneurPulse.com.au  Fact Sheet for Healthcare Providers: IncredibleEmployment.be  This test is not yet approved or cleared by the [place] FDA and has been authorized for detection and/or diagnosis of SARS-CoV-2 by FDA under an Emergency Use Authorization (EUA). This EUA will remain in effect (meaning this test can be used) for the duration of the COVID-19 declaration under Section 564(b)(1) of the Act, 21 U.S.C. section 360bbb-3(b)(1), unless the authorization is terminated or revoked.  Performed at [facility], [address] [place], [place] [address]   Urine Culture     Status: None   Collection Time: 03/27/21 12:10 AM   Specimen: Urine, Random  Result Value Ref Range Status   Specimen Description   Final    URINE, RANDOM Performed at [facility], [address], [place], [place] [address]    Special Requests   Final    NONE Performed at [facility], [address], [place], [place] [address]    Culture   Final    NO GROWTH Performed at [facility], [place] [address], [place], [place] [address]    Report Status 03/28/2021 FINAL  Final  MRSA Next Gen by PCR, Nasal     Status: None   Collection Time: 03/27/21 11:19 PM   Specimen: Nasal Mucosa; Nasal Swab  Result Value Ref Range Status   MRSA by PCR Next Gen NOT DETECTED NOT DETECTED Final    Comment: (NOTE) The GeneXpert MRSA Assay (FDA approved for NASAL specimens only), is one component of a comprehensive  MRSA colonization surveillance program. It is not intended to diagnose MRSA infection nor to guide or monitor treatment for MRSA infections. Test performance is not FDA approved in patients less than 64 years old. Performed at [facility], [address] [place],  [address]     Lipid Panel Recent Labs    03/27/21 0517  CHOL 132  TRIG 168*  HDL 24*  CHOLHDL 5.5  VLDL 34  LDLCALC 74    Studies/Results: DG Chest 2 View  Result Date: 03/26/2021 CLINICAL DATA:  Altered mental status EXAM: CHEST - 2 VIEW COMPARISON:  03/04/2021 FINDINGS: Lung volumes are small. Small bilateral pleural effusions have developed since prior examination with associated left basilar compressive atelectasis. No pneumothorax. Cardiac size within normal limits. Pulmonary vascularity is normal. No acute bone abnormality. IMPRESSION: Interval development of small bilateral pleural effusions with associated left basilar atelectasis. Electronically Signed   By: Fidela Salisbury M.D.   On: 03/26/2021 23:53   CT Head Wo Contrast  Result Date: 03/26/2021 CLINICAL DATA:  Left eye droop EXAM: CT HEAD WITHOUT CONTRAST TECHNIQUE: Contiguous axial images were obtained from the base of the skull through the vertex without intravenous contrast. COMPARISON:  03/26/2021 FINDINGS: Brain: Mild atrophic changes and chronic white matter ischemic changes are identified. A few small lacunar infarcts are noted within the basal ganglia on  the right stable in appearance from the prior exam. No acute hemorrhage or acute infarction is seen. Vascular: No hyperdense vessel or unexpected calcification. Skull: Normal. Negative for fracture or focal lesion. Sinuses/Orbits: No acute finding. Other: None. IMPRESSION: Chronic atrophic and ischemic changes without acute abnormality. Electronically Signed   By: Inez Catalina M.D.   On: 03/26/2021 21:21   MR ANGIO HEAD WO CONTRAST  Result Date: 03/27/2021 CLINICAL DATA:  Neuro  deficit, stroke suspected, left third nerve palsy, concern for aneurysm EXAM: MRA NECK WITHOUT AND WITH CONTRAST MRA HEAD WITHOUT CONTRAST TECHNIQUE: Multiplanar and multiecho pulse sequences of the neck were obtained without and with intravenous contrast. Angiographic images of the neck were obtained using MRA technique without and with intravenous contrast; Angiographic images of the Circle of Willis were obtained using MRA technique without intravenous contrast. CONTRAST:  87mL GADAVIST GADOBUTROL 1 MMOL/ML IV SOLN COMPARISON:  None. FINDINGS: MRA NECK FINDINGS Three-vessel aortic arch. The proximal left common carotid is not well evaluated due to artifact. No evidence of aortic aneurysm or dissection. No evidence of hemodynamically significant stenosis (greater than 50%), dissection, or occlusion in the bilateral common and internal carotid arteries. The right vertebral artery is not well visualized for the entirety of its course, which may indicate slow flow or stenosis. The origin of the left vertebral artery is not well visualized, secondary to artifact. The remainder of the left vertebral artery is patent, without significant stenosis (greater than 50%), dissection, or occlusion. MRA HEAD FINDINGS Both internal carotid arteries are patent to the termini, without stenosis. Somewhat fusiform dilatation of the right cavernous ICA (series 9, image 95). Focal stenosis of the origin of the left A1 (series 9, image 107). Normal right A1. Anterior cerebral arteries are patent to their distal aspects. No M1 stenosis or occlusion. Normal MCA bifurcations. Distal MCA branches perfused and relatively symmetric. The right vertebral artery is not visualized. The left vertebral artery is patent to the vertebrobasilar junction. The basilar artery demonstrates focal loss of signal (series 9, image 83), likely severe focal stenosis or occlusion. Focal narrowing is also noted in the right P1 segment, with a patent right  posterior communicating artery. The left P1 is not visualized. Patent left posterior communicating artery with fetal origin of the left PCA. Focal narrowing at the origin of the right P3 branch (series 9, image 131). PCAs are grossly patent to their distal aspects. IMPRESSION: 1. The right vertebral artery is not well visualized for the entirety of its course, both extra cranially and intracranially, likely occluded although slow flow or severe stenosis can appear similar. Consider CTA head and neck for further evaluation. 2. Fusiform dilatation of the right cavernous ICA. 3. 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 4. Focal stenosis in the left P1 and left A1. Narrowing is also noted at the origin of the right P3 branch. 5. The origin of the left vertebral artery and left common carotid artery are not well visualized, secondary to artifact. Electronically Signed   By: Merilyn Baba M.D.   On: 03/27/2021 02:19   MR Angiogram Neck W or Wo Contrast  Result Date: 03/27/2021 CLINICAL DATA:  Neuro deficit, stroke suspected, left third nerve palsy, concern for aneurysm EXAM: MRA NECK WITHOUT AND WITH CONTRAST MRA HEAD WITHOUT CONTRAST TECHNIQUE: Multiplanar and multiecho pulse sequences of the neck were obtained without and with intravenous contrast. Angiographic images of the neck were obtained using MRA technique without and with intravenous contrast; Angiographic  images of the Circle of Willis were obtained using MRA technique without intravenous contrast. CONTRAST:  28mL GADAVIST GADOBUTROL 1 MMOL/ML IV SOLN COMPARISON:  None. FINDINGS: MRA NECK FINDINGS Three-vessel aortic arch. The proximal left common carotid is not well evaluated due to artifact. No evidence of aortic aneurysm or dissection. No evidence of hemodynamically significant stenosis (greater than 50%), dissection, or occlusion in the bilateral common and internal carotid arteries. The right vertebral artery is  not well visualized for the entirety of its course, which may indicate slow flow or stenosis. The origin of the left vertebral artery is not well visualized, secondary to artifact. The remainder of the left vertebral artery is patent, without significant stenosis (greater than 50%), dissection, or occlusion. MRA HEAD FINDINGS Both internal carotid arteries are patent to the termini, without stenosis. Somewhat fusiform dilatation of the right cavernous ICA (series 9, image 95). Focal stenosis of the origin of the left A1 (series 9, image 107). Normal right A1. Anterior cerebral arteries are patent to their distal aspects. No M1 stenosis or occlusion. Normal MCA bifurcations. Distal MCA branches perfused and relatively symmetric. The right vertebral artery is not visualized. The left vertebral artery is patent to the vertebrobasilar junction. The basilar artery demonstrates focal loss of signal (series 9, image 83), likely severe focal stenosis or occlusion. Focal narrowing is also noted in the right P1 segment, with a patent right posterior communicating artery. The left P1 is not visualized. Patent left posterior communicating artery with fetal origin of the left PCA. Focal narrowing at the origin of the right P3 branch (series 9, image 131). PCAs are grossly patent to their distal aspects. IMPRESSION: 1. The right vertebral artery is not well visualized for the entirety of its course, both extra cranially and intracranially, likely occluded although slow flow or severe stenosis can appear similar. Consider CTA head and neck for further evaluation. 2. Fusiform dilatation of the right cavernous ICA. 3. 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 4. Focal stenosis in the left P1 and left A1. Narrowing is also noted at the origin of the right P3 branch. 5. The origin of the left vertebral artery and left common carotid artery are not well visualized, secondary to artifact.  Electronically Signed   By: Merilyn Baba M.D.   On: 03/27/2021 02:19   MR BRAIN WO CONTRAST  Result Date: 03/26/2021 CLINICAL DATA:  Neuro deficit, stroke suspected, loss of vision in left eye EXAM: MRI HEAD WITHOUT CONTRAST TECHNIQUE: Multiplanar, multiecho pulse sequences of the brain and surrounding structures were obtained without intravenous contrast. COMPARISON:  No prior MRI, correlation is made with CT head 03/26/2021 FINDINGS: Brain: No restricted diffusion to suggest acute or subacute infarct. No acute hemorrhage, mass, mass effect, or midline shift. Lacunar infarcts in the right basal ganglia. T2 hyperintense signal in the periventricular white matter, likely the sequela of chronic small vessel ischemic disease. No hydrocephalus or extra-axial collection. Focus of hemosiderin deposition in the right medial temporal lobe, likely sequela of prior hypertensive microhemorrhage. Vascular: Normal flow voids. Skull and upper cervical spine: Normal marrow signal. Sinuses/Orbits: Negative.  Status post right lens replacement. Other: Fluid in the bilateral mastoid air cells. IMPRESSION: No acute intracranial process. Electronically Signed   By: Merilyn Baba M.D.   On: 03/26/2021 23:50   CT Orbits W Contrast  Result Date: 03/26/2021 CLINICAL DATA:  Orbital cellulitis suspected, left eyelid droop EXAM: CT ORBITS WITH CONTRAST TECHNIQUE: Multidetector CT images was performed according  to the standard protocol following intravenous contrast administration. CONTRAST:  72mL OMNIPAQUE IOHEXOL 300 MG/ML  SOLN COMPARISON:  No prior CT orbits, correlation is made with 03/07/2021 CT head FINDINGS: Orbits: No orbital mass or evidence of inflammation. Normal appearance of the globes, optic nerve-sheath complexes, extraocular muscles, orbital fat and lacrimal glands. Status post right lens replacement. Visible paranasal sinuses: Minimal mucosal thickening, grossly clear. Soft tissues: Normal. Osseous: No fracture or  aggressive lesion. Limited intracranial: No acute or significant finding. IMPRESSION: No acute process in the orbits.  No evidence of orbital cellulitis. Electronically Signed   By: Merilyn Baba M.D.   On: 03/26/2021 19:28    Medications: Scheduled:  alfuzosin  10 mg Oral Q breakfast   ascorbic acid  500 mg Oral BID   aspirin EC  81 mg Oral Daily   carvedilol  12.5 mg Oral BID WC   Chlorhexidine Gluconate Cloth  6 each Topical Daily   enoxaparin (LOVENOX) injection  40 mg Subcutaneous Q24H   ferrous sulfate  325 mg Oral Daily   finasteride  5 mg Oral Daily   fluconazole  100 mg Oral Daily   fluticasone  2 spray Each Nare Daily   lidocaine  1 patch Transdermal Q24H   mouth rinse  15 mL Mouth Rinse BID   melatonin  2.5 mg Oral QHS   multivitamin with minerals  1 tablet Oral Daily   pantoprazole  20 mg Oral Daily   pravastatin  40 mg Oral Q2000   tiotropium  1 capsule Inhalation Daily   torsemide  20 mg Oral Daily   valACYclovir  500 mg Oral BID   zinc sulfate  220 mg Oral Daily   Continuous:  sodium chloride 100 mL/hr at 03/27/21 2301   thiamine injection 500 mg (03/28/21 1100)    Assessment/Recommendations: 87 year old male presenting with AMS, hallucinations, ptosis and ophthalmoparesis   Opthalmoparesis and ptosis: - Exam reveals inability to make eye contact or fixate directly on objects, but can count fingers and name objects visually using each eye individually. Also with optic ataxia. The apparent left third and right sixth nerve palsies seen yesterday are no longer apparent. Rather, there is globally impaired EOM with worsened function in some directions, suggestive of oculomotor apraxia. MRI has ruled out stroke or INO as the etiology. Overall improved EOM since starting thiamine supplementation yesterday, increasing the likelihood of acute thiamine deficiency as the underlying etiology. A parietal lobe lesion not visualizable on MRI could result in optic ataxia and oculomotor  apraxia, as seen on exam. Acute thiamine deficiency, would also explain his delirium (Wernicke's encephalopathy triad of opthalmoparesis, encephalopathy and ataxia). Unclear if he has any risk factors for thiamine deficiency, however. The ptosis is best explained by a focal 3rd nerve lesion; given that imaging has not revealed an etiology, diabetic CN infarction is higher on the DDx. Overall presentation could also reflect new onset myasthenia gravis, although this is felt to be lower on the DDx.  - Imaging:  - MRI brain shows no acute intracranial process.  - CT orbits with no acute process in the orbits.  No evidence of orbital cellulitis. - MRA of head: Fusiform dilatation of the right cavernous ICA. 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 Focal stenosis in the left P1 and left A1. Narrowing is also noted at the origin of the right P3 branch. The results rule out aneurysmal compression of the left 3rd cranial nerve. Also, he has no  pupillary asymmetry.  - MRA neck: The right vertebral artery is not well visualized for the entirety of its course, both extra cranially and intracranially, likely occluded although slow flow or severe stenosis can appear similar. The origin of the left vertebral artery and left common carotid artery are not well visualized, secondary to artifact. Recommendations:  - Thiamine level is pending. On empiric high-dose IV thiamine supplementation at 500 mg IV TID x 3 days, then continue with 100 mg po qd thereafter.  - Agree with continuing ASA and statin given severe stenosis vs occlusion of the basilar artery on MRA head.  - Agree with Ophthalmology that If the etiology for the patient's ocular motility deficits is in fact microvascular, he is likely to regain much of his ocular motility Observation for improvement over the next 2-3 months will determine. - Myasthenia gravis panel has been ordered. May benefit from a trial of Mestinon if  EOM do not improve with IV thiamine.    Altered mental status with mild delirium and visual hallucinations. - Exam reveals mostly intact orientation as well as good attention and concentration. However, he did have formed visual hallucinations, yesterday, for example during exam he picked at an imaginary object, grabbed a corner of his sheet and attempted to eat it, stating that it was a cantaloupe.  - No nuchal rigidity, fever, diaphoresis, flushing or significantly elevated white count to suggest a meningitis.  - DDx for AMS includes Wernicke's encephalopathy (see above) and acute hospital delirium. Recommendations: - Avoid sedating meds. Lights on and OOB to chair during the day, lights off at night in a quiet dark room.  - IVF with a goal towards optimizing hydration and resolving his hyponatremia - Thiamine supplementation as recommended above.    LOS: 1 day   @Electronically  signed: Dr. Kerney Elbe 03/28/2021  3:38 PM

## 2021-03-29 ENCOUNTER — Ambulatory Visit: Payer: Medicare HMO | Admitting: Adult Health

## 2021-03-29 DIAGNOSIS — C8307 Small cell B-cell lymphoma, spleen: Secondary | ICD-10-CM | POA: Diagnosis present

## 2021-03-29 DIAGNOSIS — H499 Unspecified paralytic strabismus: Secondary | ICD-10-CM

## 2021-03-29 DIAGNOSIS — E512 Wernicke's encephalopathy: Secondary | ICD-10-CM

## 2021-03-29 LAB — BASIC METABOLIC PANEL
Anion gap: 7 (ref 5–15)
BUN: 24 mg/dL — ABNORMAL HIGH (ref 8–23)
CO2: 29 mmol/L (ref 22–32)
Calcium: 7.9 mg/dL — ABNORMAL LOW (ref 8.9–10.3)
Chloride: 98 mmol/L (ref 98–111)
Creatinine, Ser: 1.12 mg/dL (ref 0.61–1.24)
GFR, Estimated: >60 mL/min (ref >60)
Glucose, Bld: 202 mg/dL — ABNORMAL HIGH (ref 70–99)
Potassium: 3 mmol/L — ABNORMAL LOW (ref 3.5–5.1)
Sodium: 134 mmol/L — ABNORMAL LOW (ref 135–145)

## 2021-03-29 LAB — CBC
HCT: 26.1 % — ABNORMAL LOW (ref 39.0–52.0)
Hemoglobin: 8.5 g/dL — ABNORMAL LOW (ref 13.0–17.0)
MCH: 31.5 pg (ref 26.0–34.0)
MCHC: 32.6 g/dL (ref 30.0–36.0)
MCV: 96.7 fL (ref 80.0–100.0)
Platelets: 136 10*3/uL — ABNORMAL LOW (ref 150–400)
RBC: 2.7 MIL/uL — ABNORMAL LOW (ref 4.22–5.81)
RDW: 14.5 % (ref 11.5–15.5)
WBC: 8.3 10*3/uL (ref 4.0–10.5)
nRBC: 0 % (ref 0.0–0.2)

## 2021-03-29 LAB — HEMOGLOBIN A1C
Hgb A1c MFr Bld: 5.8 % — ABNORMAL HIGH (ref 4.8–5.6)
Mean Plasma Glucose: 120 mg/dL

## 2021-03-29 MED ORDER — PROSOURCE PLUS PO LIQD
30.0000 mL | Freq: Three times a day (TID) | ORAL | Status: DC
  Administered 2021-03-29 – 2021-04-01 (×5): 30 mL via ORAL
  Filled 2021-03-29: qty 30

## 2021-03-29 MED ORDER — POTASSIUM CHLORIDE CRYS ER 20 MEQ PO TBCR: 40.0000 meq | EXTENDED_RELEASE_TABLET | Freq: Two times a day (BID) | ORAL | Status: DC

## 2021-03-29 MED ORDER — POTASSIUM CHLORIDE CRYS ER 20 MEQ PO TBCR
20.0000 meq | EXTENDED_RELEASE_TABLET | Freq: Two times a day (BID) | ORAL | Status: AC
  Administered 2021-03-29 – 2021-03-30 (×4): 20 meq via ORAL
  Filled 2021-03-29 (×4): qty 1

## 2021-03-29 MED ORDER — DM-GUAIFENESIN ER 30-600 MG PO TB12
1.0000 | ORAL_TABLET | Freq: Two times a day (BID) | ORAL | Status: DC
  Administered 2021-03-29 – 2021-04-02 (×7): 1 via ORAL
  Filled 2021-03-29 (×8): qty 1

## 2021-03-29 MED ORDER — ENSURE ENLIVE PO LIQD
237.0000 mL | Freq: Two times a day (BID) | ORAL | Status: DC
  Administered 2021-03-31 – 2021-04-01 (×2): 237 mL via ORAL

## 2021-03-29 NOTE — Progress Notes (Signed)
Subjective: Laying comfortably in bed.   Objective: Current vital signs: BP (!) 86/41 (BP Location: Right Arm)    Pulse 69    Temp 98.2 F (36.8 C)    Resp 18    Ht 5\' 4"  (1.626 m)    Wt 76.7 kg    SpO2 94%    BMI 29.01 kg/m  Vital signs in last 24 hours: Temp:  [97.6 F (36.4 C)-98.2 F (36.8 C)] 98.2 F (36.8 C) (12/19 1930) Pulse Rate:  [63-70] 69 (12/19 1930) Resp:  [16-22] 18 (12/19 1930) BP: (86-115)/(41-62) 86/41 (12/19 1930) SpO2:  [92 %-100 %] 94 % (12/19 1930)  Intake/Output from previous day: 12/18 0701 - 12/19 0700 In: 240 [P.O.:240] Out: 950 [Urine:950] Intake/Output this shift: No intake/output data recorded. Nutritional status:  Diet Order             DIET DYS 2 Room service appropriate? Yes with Assist; Fluid consistency: Thin  Diet effective now                   Physical Exam  HEENT-  [place]/AT Lungs- Respirations unlabored Extremities- Warm and well perfused    Neurological Examination Mental Status: Awakens easily to an alert state, but with decreased attention.  Speech is sparse, but fluent and nondysarthric. He has poor insight. No evidence for formed visual hallucinations at the time of bedside exam today.   Cranial Nerves: II: PERRL. Visual fields intacl.  III,IV, VI:  - Severe left ptosis is unchanged from yesterday. Right eyelid is normal.  - Inability to make eye contact or fixate directly on objects, but can count fingers and name objects visually, using each eye individually. - Right eye with paresis in all 4 directions, worse with abduction and least compromised with adduction.  - Left eye with paresis in all 4 directions of gaze, worse overall than right eye and most pronounced deficit with left eye adduction (medial deviation), but is able to cross midline to the right. - Intact oculocephalic reflexes, showing normalized eye motility relative to volitional movements. Oculocephalics are unchanged from yesterday.  - No nystagmus.  -  Exotropia noted when gaze is at midposition. - Volitional ocular motility unchanged since yesterday VII: Face symmetric VIII: Mildly HOH  IX,X: No hypophonia or hoarseness XI: Head is midline.  Motor: RUE 4+/5 LUE 4+/5 RLE 2/5 hip flexion, 4-/5 knee extension and knee flexion LLE 3/5 hip flexion, 4/5 knee extension and knee flexion, ADF and APF 4/5 Sensory: FT intact x 4.   Deep Tendon Reflexes: Unchanged Cerebellar: Past-pointing is severe bilaterally, with slow movements. Patient noted not to be looking directly at examiner's finger when attempting to touch. Exam performed with one eye open only on each side, with same results.  Gait: Deferred  Lab Results: Results for orders placed or performed during the hospital encounter of 03/26/21 (from the past 48 hour(s))  MRSA Next Gen by PCR, Nasal     Status: None   Collection Time: 03/27/21 11:19 PM   Specimen: Nasal Mucosa; Nasal Swab  Result Value Ref Range   MRSA by PCR Next Gen NOT DETECTED NOT DETECTED    Comment: (NOTE) The GeneXpert MRSA Assay (FDA approved for NASAL specimens only), is one component of a comprehensive MRSA colonization surveillance program. It is not intended to diagnose MRSA infection nor to guide or monitor treatment for MRSA infections. Test performance is not FDA approved in patients less than 32 years old. Performed at [facility], [address] [place], [place] [address]   CBC     Status: Abnormal   Collection Time: 03/28/21  4:51 AM  Result Value Ref Range   WBC 9.1 4.0 - 10.5 K/uL   RBC 2.92 (L) 4.22 - 5.81 MIL/uL   Hemoglobin 9.1 (L) 13.0 - 17.0 g/dL   HCT 27.5 (L) 39.0 - 52.0 %   MCV 94.2 80.0 - 100.0 fL   MCH 31.2 26.0 - 34.0 pg   MCHC 33.1 30.0 - 36.0 g/dL   RDW 14.5 11.5 - 15.5 %   Platelets 144 (L) 150 - 400 K/uL   nRBC 0.0 0.0 - 0.2 %    Comment: Performed at [facility], [address] [place], [place] [address]  Basic metabolic panel     Status: Abnormal    Collection Time: 03/28/21  4:51 AM  Result Value Ref Range   Sodium 131 (L) 135 - 145 mmol/L   Potassium 3.4 (L) 3.5 - 5.1 mmol/L   Chloride 97 (L) 98 - 111 mmol/L   CO2 25 22 - 32 mmol/L   Glucose, Bld 165 (H) 70 - 99 mg/dL    Comment: Glucose reference range applies only to samples taken after fasting for at least 8 hours.   BUN 21 8 - 23 mg/dL   Creatinine, Ser 1.02 0.61 - 1.24 mg/dL   Calcium 7.8 (L) 8.9 - 10.3 mg/dL   GFR, Estimated >60 >60 mL/min    Comment: (NOTE) Calculated using the CKD-EPI Creatinine Equation (2021)    Anion gap 9 5 - 15    Comment: Performed at [facility], [address] [place], [place] [address]  CBC     Status: Abnormal   Collection Time: 03/29/21  4:19 AM  Result Value Ref Range   WBC 8.3 4.0 - 10.5 K/uL   RBC 2.70 (L) 4.22 - 5.81 MIL/uL   Hemoglobin 8.5 (L) 13.0 - 17.0 g/dL   HCT 26.1 (L) 39.0 - 52.0 %   MCV 96.7 80.0 - 100.0 fL   MCH 31.5 26.0 - 34.0 pg   MCHC 32.6 30.0 - 36.0 g/dL   RDW 14.5 11.5 - 15.5 %   Platelets 136 (L) 150 - 400 K/uL   nRBC 0.0 0.0 - 0.2 %    Comment: Performed at [facility], [address], [place],  [address]  Basic metabolic panel     Status: Abnormal   Collection Time: 03/29/21  4:19 AM  Result Value Ref Range   Sodium 134 (L) 135 - 145 mmol/L   Potassium 3.0 (L) 3.5 - 5.1 mmol/L   Chloride 98 98 - 111 mmol/L   CO2 29 22 - 32 mmol/L   Glucose, Bld 202 (H) 70 - 99 mg/dL    Comment: Glucose reference range applies only to samples taken after fasting for at least 8 hours.   BUN 24 (H) 8 - 23 mg/dL   Creatinine, Ser 1.12 0.61 - 1.24 mg/dL   Calcium 7.9 (L) 8.9 - 10.3 mg/dL   GFR, Estimated >60 >60 mL/min    Comment: (NOTE) Calculated using the CKD-EPI Creatinine Equation (2021)    Anion gap 7 5 - 15    Comment: Performed at [facility], [address] [place],  [address]    Recent Results (from the past 240 hour(s))  Culture, blood (routine x 2)     Status:  None (Preliminary result)   Collection Time: 03/26/21  6:15 PM   Specimen: BLOOD  Result Value Ref Range Status   Specimen Description BLOOD LEFT ANTECUBITAL  Final   Special Requests   Final    BOTTLES DRAWN AEROBIC AND ANAEROBIC Blood Culture adequate volume   Culture   Final    NO GROWTH 3 DAYS Performed at [facility], [address] [place], [place] [address]    Report Status PENDING  Incomplete  Culture, blood (routine x 2)     Status: None (Preliminary result)   Collection Time: 03/26/21  6:22 PM   Specimen: BLOOD  Result Value Ref Range Status   Specimen Description BLOOD BLOOD LEFT HAND  Final   Special Requests   Final    BOTTLES DRAWN AEROBIC AND ANAEROBIC Blood Culture adequate volume   Culture   Final    NO GROWTH 3 DAYS Performed at [facility], [address], [place], [place] [address]    Report Status PENDING  Incomplete  Resp Panel by RT-PCR (Flu A&B, Covid) Nasopharyngeal Swab     Status: None   Collection Time: 03/27/21 12:02 AM   Specimen: Nasopharyngeal Swab; Nasopharyngeal(NP) swabs in vial transport medium  Result Value Ref Range Status   SARS Coronavirus 2 by RT PCR NEGATIVE NEGATIVE Final    Comment: (NOTE) SARS-CoV-2 target nucleic acids are NOT DETECTED.  The SARS-CoV-2 RNA is generally detectable in upper respiratory specimens during the acute phase of infection. The lowest concentration of SARS-CoV-2 viral copies this assay can detect is 138 copies/mL. A negative result does not preclude SARS-Cov-2 infection and should not be used as the sole basis for treatment or other patient management decisions. A negative result may occur with  improper specimen collection/handling, submission of specimen other than nasopharyngeal swab, presence of viral mutation(s) within the areas targeted by this assay, and inadequate number of viral copies(<138 copies/mL). A negative result must be combined with clinical observations, patient  history, and epidemiological information. The expected result is Negative.  Fact Sheet for Patients:  EntrepreneurPulse.com.au  Fact Sheet for Healthcare Providers:  IncredibleEmployment.be  This test is no t yet approved or cleared by the [place] FDA and  has been authorized for detection and/or diagnosis of SARS-CoV-2 by FDA under an Emergency Use Authorization (EUA). This EUA will remain  in effect (meaning this test can be used) for the duration of the COVID-19 declaration under Section 564(b)(1) of the Act, 21 U.S.C.section 360bbb-3(b)(1), unless the authorization is terminated  or revoked sooner.       Influenza A by PCR NEGATIVE NEGATIVE Final   Influenza B by PCR NEGATIVE NEGATIVE Final    Comment: (NOTE) The Xpert Xpress SARS-CoV-2/FLU/RSV plus assay is intended as an aid in the diagnosis of influenza from Nasopharyngeal swab specimens and should not be used as a sole basis for treatment. Nasal washings and aspirates are unacceptable for Xpert Xpress SARS-CoV-2/FLU/RSV testing.  Fact Sheet for Patients: EntrepreneurPulse.com.au  Fact Sheet for Healthcare Providers: IncredibleEmployment.be  This test is not yet approved or cleared by the [place] FDA and has been authorized for detection and/or diagnosis of SARS-CoV-2 by FDA under an Emergency Use Authorization (EUA). This EUA will remain in effect (meaning this test can be used) for the duration of the COVID-19 declaration under Section 564(b)(1) of the Act, 21 U.S.C. section 360bbb-3(b)(1), unless the authorization is terminated or revoked.  Performed at [facility], [address] [place], [place] [address]   Urine Culture     Status: None   Collection Time: 03/27/21 12:10 AM  Specimen: Urine, Random  Result Value Ref Range Status   Specimen Description   Final    URINE, RANDOM Performed at [facility],  [address], [place], [place] [address]    Special Requests   Final    NONE Performed at [facility], [address] [place], [place] [address]    Culture   Final    NO GROWTH Performed at [facility], [address], [place], [place] [address]    Report Status 03/28/2021 FINAL  Final  MRSA Next Gen by PCR, Nasal     Status: None   Collection Time: 03/27/21 11:19 PM   Specimen: Nasal Mucosa; Nasal Swab  Result Value Ref Range Status   MRSA by PCR Next Gen NOT DETECTED NOT DETECTED Final    Comment: (NOTE) The GeneXpert MRSA Assay (FDA approved for NASAL specimens only), is one component of a comprehensive MRSA colonization surveillance program. It is not intended to diagnose MRSA infection nor to guide or monitor treatment for MRSA infections. Test performance is not FDA approved in patients less than 63 years old. Performed at [facility], [address] [place], [place] [address]     Lipid Panel Recent Labs    03/27/21 0517  CHOL 132  TRIG 168*  HDL 24*  CHOLHDL 5.5  VLDL 34  LDLCALC 74     Studies/Results: No results found.  Medications: Scheduled:  (feeding supplement) PROSource Plus  30 mL Oral TID BM   alfuzosin  10 mg Oral Q breakfast   ascorbic acid  500 mg Oral BID   aspirin EC  81 mg Oral Daily   carvedilol  12.5 mg Oral BID WC   Chlorhexidine Gluconate Cloth  6 each Topical Daily   dextromethorphan-guaiFENesin  1 tablet Oral BID   enoxaparin (LOVENOX) injection  40 mg Subcutaneous Q24H   [START ON 03/30/2021] feeding supplement  237 mL Oral BID BM   ferrous sulfate  325 mg Oral Daily   finasteride  5 mg Oral Daily   fluconazole  100 mg Oral Daily   fluticasone  2 spray Each Nare Daily   lidocaine  1 patch Transdermal Q24H   mouth rinse  15 mL Mouth Rinse BID   melatonin  2.5 mg Oral QHS   multivitamin with minerals  1 tablet Oral Daily   pantoprazole  20 mg Oral Daily   potassium chloride  20 mEq Oral BID    pravastatin  40 mg Oral Q2000   tiotropium  1 capsule Inhalation Daily   torsemide  20 mg Oral Daily   valACYclovir  500 mg Oral BID   zinc sulfate  220 mg Oral Daily   Continuous:  sodium chloride Stopped (03/28/21 2319)   thiamine injection 500 mg (03/29/21 1712)    Assessment/Recommendations: 87 year old male presenting with AMS, hallucinations, ptosis and ophthalmoparesis   Opthalmoparesis and ptosis: - Exam reveals inability to make eye contact or fixate directly on objects, but can count fingers and name objects visually using each eye individually. Also with optic ataxia. The apparent left third and right sixth nerve palsies seen on admission are no longer apparent. Rather, there is globally impaired EOM with worsened function in some directions, suggestive of oculomotor apraxia. MRI has ruled out stroke or INO as the etiology. Overall improved EOM since starting thiamine supplementation, increasing the likelihood of acute thiamine deficiency as the underlying etiology. A parietal lobe lesion not visualizable on MRI could result in  optic ataxia and oculomotor apraxia, as seen on exam. Acute thiamine deficiency, would also explain his delirium (Wernicke's encephalopathy triad of opthalmoparesis, encephalopathy and ataxia). Unclear if he has any risk factors for thiamine deficiency, however. The ptosis is best explained by a focal 3rd nerve lesion; given that imaging has not revealed an etiology, diabetic CN infarction is higher on the DDx. Overall presentation could also reflect new onset myasthenia gravis, although this is felt to be lower on the DDx.  - Imaging:  - MRI brain shows no acute intracranial process.  - CT orbits with no acute process in the orbits.  No evidence of orbital cellulitis. - MRA of head: Fusiform dilatation of the right cavernous ICA. 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 Focal stenosis in the left P1 and left A1. Narrowing is  also noted at the origin of the right P3 branch. The results rule out aneurysmal compression of the left 3rd cranial nerve. Also, he has no pupillary asymmetry.  - MRA neck: The right vertebral artery is not well visualized for the entirety of its course, both extra cranially and intracranially, likely occluded although slow flow or severe stenosis can appear similar. The origin of the left vertebral artery and left common carotid artery are not well visualized, secondary to artifact. Recommendations:  - Thiamine level is pending. On empiric high-dose IV thiamine supplementation at 500 mg IV TID x 3 days, then continue with 100 mg po qd thereafter.  - Agree with continuing ASA and statin given severe stenosis vs occlusion of the basilar artery on MRA head.  - Agree with Ophthalmology that If the etiology for the patient's ocular motility deficits is in fact microvascular, he is likely to regain much of his ocular motility Observation for improvement over the next 2-3 months will determine. - Myasthenia gravis panel has been ordered. May benefit from a trial of Mestinon if EOM do not improve with IV thiamine.    Altered mental status with mild delirium and visual hallucinations. - Exam reveals mostly intact orientation as well as good attention and concentration. He has not had visual hallucinations for the past 48 hrs. - No nuchal rigidity, fever, diaphoresis, flushing or significantly elevated white count to suggest a meningitis.  - DDx for AMS includes Wernicke's encephalopathy (see above) and acute hospital delirium. Recommendations: - Avoid sedating meds. Lights on and OOB to chair during the day, lights off at night in a quiet dark room.  - IVF with a goal towards optimizing hydration and resolving his hyponatremia - Thiamine supplementation as recommended above.   Will continue to follow.  Su Monks, MD [facility] [phone]  If 7pm- 7am, please page neurology on call  as listed in [place].

## 2021-03-29 NOTE — NC FL2 (Signed)
[place] LEVEL OF CARE SCREENING TOOL     IDENTIFICATION  Patient Name: Matthew Miles. Birthdate: May 04, 1928 Sex: male Admission Date (Current Location): 03/26/2021  [facility] and [place] Number:  Engineering geologist and Address:  [facility], [address] [place], [place] [address]      Provider Number: [id number]  Attending Physician Name and Address:  Little Ishikawa, MD  Relative Name and Phone Number:       Current Level of Care: Hospital Recommended Level of Care: [place] Prior Approval Number:    Date Approved/Denied:   PASRR Number: [id number]  Discharge Plan: SNF    Current Diagnoses: Patient Active Problem List   Diagnosis Date Noted   Splenic marginal zone b-cell lymphoma ([place]) 03/29/2021   Pressure injury of skin 03/28/2021   Third nerve palsy of left eye 03/27/2021   Acute non intractable tension-type headache 03/10/2021   Nasal discharge with watery eyes 03/10/2021   Osteopenia 02/15/2021   B12 deficiency 07/31/2020   PAD (peripheral artery disease) ([place]) 01/09/2020   Chronic venous insufficiency 01/09/2020   Elevated TSH 01/01/2020   Decreased pedal pulses 01/01/2020   Diabetic neuropathy ([place]) 12/31/2018   Diabetic retinopathy ([place]) 10/07/2018   Monoclonal B-cell lymphocytosis 07/18/2018   Prostate cancer ([place]) 07/04/2018   BPH (benign prostatic hyperplasia) 02/10/2017   Actinic keratoses 12/24/2015   Osteoarthritis of both hands 12/24/2015   Obesity (BMI 30-39.9) 10/21/2014   Essential hypertension 07/21/2014   Murmur, cardiac 10/15/2013   Chronic low back pain 10/08/2013   Chronic diastolic CHF (congestive heart failure) ([place]) 04/15/2013   Thrombocytopenia ([place]) 08/21/2012   Urge incontinence 07/02/2012   Hyperlipidemia 06/08/2012   H/O malignant neoplasm of prostate 06/04/2012   Diabetes ([place]) 04/26/2012   Iron deficiency anemia 01/06/2012   Pulmonary hypertension ([place])  11/25/2011   GERD (gastroesophageal reflux disease) 06/13/2011   Coronary artery disease of native artery of native heart with stable angina pectoris ([place]) 04/26/2011   COPD (chronic obstructive pulmonary disease) (HCC)     Orientation RESPIRATION BLADDER Height & Weight     Self, Place  O2 ([place] 2L) Incontinent Weight: 169 lb (76.7 kg) Height:  5\' 4"  (162.6 cm)  BEHAVIORAL SYMPTOMS/MOOD NEUROLOGICAL BOWEL NUTRITION STATUS      Incontinent Diet (DYS 2 DIET, THIN LIQUIDS)  AMBULATORY STATUS COMMUNICATION OF NEEDS Skin   Extensive Assist Verbally PU Stage and Appropriate Care (LOCATED ON SACRUM, FOAM DRESSING)                       Personal Care Assistance Level of Assistance  Bathing, Feeding, Dressing Bathing Assistance: Maximum assistance Feeding assistance: Independent Dressing Assistance: Maximum assistance     Functional Limitations Info  Sight, Hearing, Speech Sight Info: Adequate Hearing Info: Adequate Speech Info: Adequate    SPECIAL CARE FACTORS FREQUENCY  PT (By licensed PT), OT (By licensed OT)     PT Frequency: 5X OT Frequency: 5X            Contractures Contractures Info: Not present    Additional Factors Info  Code Status, Allergies Code Status Info: FULL CODE Allergies Info: Oxybutynin, Sulfa Antibiotics, Tradjenta (Linagliptin)           Current Medications (03/29/2021):  This is the current hospital active medication list Current Facility-Administered Medications  Medication Dose Route Frequency Provider Last Rate Last Admin   0.9 %  sodium chloride infusion   Intravenous Continuous Mansy, Jan A,  MD   Stopped at 03/28/21 2319   acetaminophen (TYLENOL) tablet 650 mg  650 mg Oral Q4H PRN Mansy, Jan A, MD       Or   acetaminophen (TYLENOL) 160 MG/5ML solution 650 mg  650 mg Per Tube Q4H PRN Mansy, Arvella Merles, MD       Or   acetaminophen (TYLENOL) suppository 650 mg  650 mg Rectal Q4H PRN Mansy, Jan A, MD       albuterol (PROVENTIL) (2.5 MG/3ML)  0.083% nebulizer solution 2.5 mg  2.5 mg Nebulization Q6H PRN Renda Rolls, RPH       alfuzosin (UROXATRAL) 24 hr tablet 10 mg  10 mg Oral Q breakfast Mansy, Jan A, MD   10 mg at 03/29/21 7209   ascorbic acid (VITAMIN C) tablet 500 mg  500 mg Oral BID Mansy, Jan A, MD   500 mg at 03/29/21 0844   aspirin EC tablet 81 mg  81 mg Oral Daily Mansy, Jan A, MD   81 mg at 03/29/21 0844   carvedilol (COREG) tablet 12.5 mg  12.5 mg Oral BID WC Mansy, Jan A, MD   12.5 mg at 03/29/21 4709   Chlorhexidine Gluconate Cloth 2 % PADS 6 each  6 each Topical Daily Mansy, Jan A, MD   6 each at 03/29/21 0840   enoxaparin (LOVENOX) injection 40 mg  40 mg Subcutaneous Q24H Mansy, Jan A, MD   40 mg at 03/29/21 6283   ferrous sulfate tablet 325 mg  325 mg Oral Daily Mansy, Jan A, MD   325 mg at 03/29/21 0844   finasteride (PROSCAR) tablet 5 mg  5 mg Oral Daily Mansy, Jan A, MD   5 mg at 03/29/21 0844   fluconazole (DIFLUCAN) tablet 100 mg  100 mg Oral Daily Mansy, Jan A, MD   100 mg at 03/29/21 0839   fluticasone (FLONASE) 50 MCG/ACT nasal spray 2 spray  2 spray Each Nare Daily Mansy, Jan A, MD   2 spray at 03/29/21 0838   lidocaine (LIDODERM) 5 % 1 patch  1 patch Transdermal Q24H Mansy, Jan A, MD   1 patch at 03/29/21 0840   MEDLINE mouth rinse  15 mL Mouth Rinse BID Mansy, Jan A, MD   15 mL at 03/29/21 0845   melatonin tablet 2.5 mg  2.5 mg Oral QHS Mansy, Jan A, MD   2.5 mg at 03/28/21 2205   multivitamin with minerals tablet 1 tablet  1 tablet Oral Daily Mansy, Jan A, MD   1 tablet at 03/29/21 0844   nitroGLYCERIN (NITROSTAT) SL tablet 0.4 mg  0.4 mg Sublingual Q5 min PRN Mansy, Jan A, MD       pantoprazole (PROTONIX) EC tablet 20 mg  20 mg Oral Daily Mansy, Jan A, MD   20 mg at 03/29/21 0839   potassium chloride SA (KLOR-CON M) CR tablet 20 mEq  20 mEq Oral BID Little Ishikawa, MD   20 mEq at 03/29/21 0844   pravastatin (PRAVACHOL) tablet 40 mg  40 mg Oral Q2000 Mansy, Jan A, MD   40 mg at 03/28/21 2204    senna-docusate (Senokot-S) tablet 1 tablet  1 tablet Oral QHS PRN Mansy, Jan A, MD       thiamine 500mg  in normal saline (42ml) IVPB  500 mg Intravenous TID Kerney Elbe, MD 100 mL/hr at 03/29/21 0856 500 mg at 03/29/21 0856   tiotropium (SPIRIVA) inhalation capsule ([facility] use ONLY) 18 mcg  1 capsule  Inhalation Daily Mansy, Jan A, MD   18 mcg at 03/29/21 6712   torsemide [facility]) tablet 20 mg  20 mg Oral Daily Mansy, Jan A, MD   20 mg at 03/29/21 4580   valACYclovir (VALTREX) tablet 500 mg  500 mg Oral BID Mansy, Jan A, MD   500 mg at 03/29/21 9983   zinc sulfate capsule 220 mg  220 mg Oral Daily Mansy, Jan A, MD   220 mg at 03/29/21 0845     Discharge Medications: Please see discharge summary for a list of discharge medications.  Relevant Imaging Results:  Relevant Lab Results:   Additional Information ssn:[id number]  Gerrianne Scale Aryella Besecker, LCSW

## 2021-03-29 NOTE — TOC Initial Note (Signed)
Transition of Care (TOC) - Initial/Assessment Note    Patient Details  Name: Matthew Miles. MRN: [id number] Date of Birth: 01-17-1929  Transition of Care [facility]) CM/SW Contact:    Eileen Stanford, LCSW Phone Number: 03/29/2021, 3:01 PM  Clinical Narrative:   CSW spoke with pt's daughter. She states she is agreeable to pt going to SNF however, she would like for the dc not to be rushed so that he can be successful at SNF. Pt's daughter states he was dc from [place] too early which resulted in hospital admission shortly after. Pt's daughter states pt has been to [facility] recently and he would want to go there at d/c. Per daughter [facility] is aware. Referral has been sent.                Expected Discharge Plan: Skilled Nursing Facility Barriers to Discharge: Continued Medical Work up   Patient Goals and CMS Choice Patient states their goals for this hospitalization and ongoing recovery are:: for pt to return to [facility]   Choice offered to / list presented to : Adult Children  Expected Discharge Plan and Services Expected Discharge Plan: [place] In-house Referral: Clinical Social Work   Post Acute Care Choice: [place] Living arrangements for the past 2 months: [place]                                      Prior Living Arrangements/Services Living arrangements for the past 2 months: [place] Lives with:: Facility Resident Patient language and need for interpreter reviewed:: Yes Do you feel safe going back to the place where you live?: Yes      Need for Family Participation in Patient Care: Yes (Comment) Care giver support system in place?: Yes (comment)   Criminal Activity/Legal Involvement Pertinent to Current Situation/Hospitalization: No - Comment as needed  Activities of Daily Living Home Assistive Devices/Equipment: Dentures (specify type), Eyeglasses, Hearing aid ADL Screening (condition at time of  admission) Patient's cognitive ability adequate to safely complete daily activities?: No Is the patient deaf or have difficulty hearing?: Yes Does the patient have difficulty seeing, even when wearing glasses/contacts?: Yes Does the patient have difficulty concentrating, remembering, or making decisions?: Yes Patient able to express need for assistance with ADLs?: Yes Does the patient have difficulty dressing or bathing?: Yes Independently performs ADLs?: No Does the patient have difficulty walking or climbing stairs?: Yes Weakness of Legs: Both Weakness of Arms/Hands: Both  Permission Sought/Granted Permission sought to share information with : Family Supports Permission granted to share information with : Yes, Release of Information Signed  Share Information with NAME: [name]  Permission granted to share info w AGENCY: [facility]  Permission granted to share info w Relationship: daughter     Emotional Assessment Appearance:: Appears stated age Attitude/Demeanor/Rapport: Unable to Assess Affect (typically observed): Unable to Assess Orientation: : Oriented to Self, Oriented to Place, Oriented to  Time, Oriented to Situation Alcohol / Substance Use: Not Applicable Psych Involvement: No (comment)  Admission diagnosis:  Delirium [R41.0] Hyponatremia [E87.1] Third nerve palsy of left eye [H49.02] Ptosis of left eyelid [H02.402] 3rd cranial nerve palsy, left [H49.02] Acute loss of vision, left [H53.132] Patient Active Problem List   Diagnosis Date Noted   Splenic marginal zone b-cell lymphoma ([place]) 03/29/2021   Pressure injury of skin 03/28/2021   Third nerve palsy of left eye 03/27/2021  Acute non intractable tension-type headache 03/10/2021   Nasal discharge with watery eyes 03/10/2021   Osteopenia 02/15/2021   B12 deficiency 07/31/2020   PAD (peripheral artery disease) ([place]) 01/09/2020   Chronic venous insufficiency 01/09/2020   Elevated TSH 01/01/2020   Decreased pedal  pulses 01/01/2020   Diabetic neuropathy ([place]) 12/31/2018   Diabetic retinopathy ([place]) 10/07/2018   Monoclonal B-cell lymphocytosis 07/18/2018   Prostate cancer ([place]) 07/04/2018   BPH (benign prostatic hyperplasia) 02/10/2017   Actinic keratoses 12/24/2015   Osteoarthritis of both hands 12/24/2015   Obesity (BMI 30-39.9) 10/21/2014   Essential hypertension 07/21/2014   Murmur, cardiac 10/15/2013   Chronic low back pain 10/08/2013   Chronic diastolic CHF (congestive heart failure) ([place]) 04/15/2013   Thrombocytopenia ([place]) 08/21/2012   Urge incontinence 07/02/2012   Hyperlipidemia 06/08/2012   H/O malignant neoplasm of prostate 06/04/2012   Diabetes ([place]) 04/26/2012   Iron deficiency anemia 01/06/2012   Pulmonary hypertension ([place]) 11/25/2011   GERD (gastroesophageal reflux disease) 06/13/2011   Coronary artery disease of native artery of native heart with stable angina pectoris ([place]) 04/26/2011   COPD (chronic obstructive pulmonary disease) ([place])    PCP:  [name], MD Pharmacy:   [facility] [phone] [address], [place] [place] [address] Phone: [phone] Fax: [phone]  [facility] - [place], [place] [address] Phone: [phone] Fax: [phone]     Social Determinants of Health (SDOH) Interventions    Readmission Risk Interventions No flowsheet data found.

## 2021-03-29 NOTE — Progress Notes (Signed)
Physical Therapy Treatment Patient Details Name: Matthew Miles. MRN: [id number] DOB: 1928/09/07 Today's Date: 03/29/2021   History of Present Illness Pt is a 87 y/o M admitted on 03/26/21 with c/c of acute onset of generalized weakness & malaise & inability to open the L eye. CT & brain MRI showed no acute abnormalities. 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 PMH: CHF, COPD, CAD, DM2, emphyseam, GERD, HTN, dyslipidemia, prostate CA    PT Comments    Pt seen for PT tx with co-tx with OT. Nursing staff finishing up bathing pt upon therapy arrival. Pt very lethargic during session requiring max cuing to increase alertness but minimal success during session. Pt requires max assist +2 for bed mobility & is able to sit EOB with as little as min assist. Assisted pt with sit>stand x1-2 times with max assist +2 but pt noted to be incontinent of BM & assisted back supine. Pt quickly falls asleep despite PT/OT performing peri hygiene. Pt left sitting upright in bed with all needs in reach, bed alarm set, daughter present. Of note, pt's daughter reports pt was independent & driving 2 weeks prior to hospitalizations.     Recommendations for follow up therapy are one component of a multi-disciplinary discharge planning process, led by the attending physician.  Recommendations may be updated based on patient status, additional functional criteria and insurance authorization.  Follow Up Recommendations  Skilled nursing-short term rehab (<3 hours/day)     Assistance Recommended at Discharge Frequent or constant Supervision/Assistance  Equipment Recommendations  None recommended by PT (TBD in next venue)    Recommendations for Other Services       Precautions / Restrictions Precautions Precautions: Fall Restrictions Weight Bearing Restrictions: No     Mobility  Bed Mobility Overal bed mobility: Needs Assistance Bed Mobility: Rolling Rolling:  Max assist;+2 for physical assistance   Supine to sit: Max assist;+2 for physical assistance Sit to supine: Max assist;+2 for physical assistance        Transfers Overall transfer level: Needs assistance Equipment used: Rolling walker (2 wheels) Transfers: Sit to/from Stand Sit to Stand: Max assist;+2 physical assistance;From elevated surface                Ambulation/Gait                   Stairs             Wheelchair Mobility    Modified Rankin (Stroke Patients Only)       Balance Overall balance assessment: Needs assistance Sitting-balance support: Feet supported;Bilateral upper extremity supported Sitting balance-Leahy Scale: Poor Sitting balance - Comments: L lateral lean, slight posterior lean   Standing balance support: Bilateral upper extremity supported;Reliant on assistive device for balance Standing balance-Leahy Scale: Zero                              Cognition Arousal/Alertness: Lethargic   Overall Cognitive Status: Impaired/Different from baseline Area of Impairment: Orientation;Following commands;Safety/judgement;Attention;Awareness;Memory;Problem solving                 Orientation Level: Disoriented to;Situation;Place;Time   Memory: Decreased recall of precautions;Decreased short-term memory Following Commands: Follows one step commands inconsistently Safety/Judgement: Decreased awareness of safety;Decreased awareness of deficits Awareness: Intellectual;Emergent;Anticipatory Problem Solving: Slow processing;Decreased initiation;Requires tactile cues;Requires verbal cues;Difficulty sequencing General Comments: Reports he's in [place] then states [facility], is able  to state he's in a hospital when given choice of 2.        Exercises      General Comments General comments (skin integrity, edema, etc.): Pt incontinent of BM & requires total assist for peri hygiene. Pt on 2L/min via nasal cannula with SpO2  89-91%, cuing to breathe through nose vs mouth but pt unable to follow commands.      Pertinent Vitals/Pain Pain Assessment: Faces Faces Pain Scale: No hurt    Home Living                          Prior Function            PT Goals (current goals can now be found in the care plan section) Acute Rehab PT Goals PT Goal Formulation: Patient unable to participate in goal setting Time For Goal Achievement: 04/10/21 Potential to Achieve Goals: Fair Progress towards PT goals: Progressing toward goals    Frequency    Min 2X/week      PT Plan Current plan remains appropriate    Co-evaluation PT/OT/SLP Co-Evaluation/Treatment: Yes Reason for Co-Treatment: Complexity of the patient's impairments (multi-system involvement);Necessary to address cognition/behavior during functional activity;For patient/therapist safety PT goals addressed during session: Mobility/safety with mobility;Balance;Proper use of DME        AM-PAC PT "6 Clicks" Mobility   Outcome Measure  Help needed turning from your back to your side while in a flat bed without using bedrails?: Total Help needed moving from lying on your back to sitting on the side of a flat bed without using bedrails?: Total Help needed moving to and from a bed to a chair (including a wheelchair)?: Total Help needed standing up from a chair using your arms (e.g., wheelchair or bedside chair)?: Total Help needed to walk in hospital room?: Total Help needed climbing 3-5 steps with a railing? : Total 6 Click Score: 6    End of Session Equipment Utilized During Treatment: Gait belt;Oxygen Activity Tolerance: Patient limited by lethargy Patient left: in bed;with call bell/phone within reach;with bed alarm set;with family/visitor present Nurse Communication: Mobility status PT Visit Diagnosis: Unsteadiness on feet (R26.81);Difficulty in walking, not elsewhere classified (R26.2);Muscle weakness (generalized) (M62.81)      Time: 1021-1050 PT Time Calculation (min) (ACUTE ONLY): 29 min  Charges:  $Therapeutic Activity: 8-22 mins                     Lavone Nian, PT, DPT 03/29/21, 12:58 PM    Waunita Schooner 03/29/2021, 12:56 PM

## 2021-03-29 NOTE — Progress Notes (Signed)
Occupational Therapy Treatment Patient Details Name: Matthew Miles. MRN: [id number] DOB: December 06, 1928 Today's Date: 03/29/2021   History of present illness Pt is a 87 y/o M admitted on 03/26/21 with c/c of acute onset of generalized weakness & malaise & inability to open the L eye. CT & brain MRI showed no acute abnormalities. 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 PMH: CHF, COPD, CAD, DM2, emphyseam, GERD, HTN, dyslipidemia, prostate CA   OT comments  Mr Reither was seen for OT/PT co-treatment on this date. Upon arrival to room pt reclined in bed, agreeable to tx. Pt requires MAX A x2 + RW for attempted ADL t/f - B knee buckling requiring TOTAL A to return to sitting safely. Noted to have had BM upon standing. Unsafe to attempt OOB - RN notified. Toileting completed bed level with MAX A. Pt making limited progress toward goals. Pt continues to benefit from skilled OT services to maximize return to PLOF and minimize risk of future falls, injury, caregiver burden, and readmission. Will continue to follow POC. Discharge recommendation remains appropriate.     Recommendations for follow up therapy are one component of a multi-disciplinary discharge planning process, led by the attending physician.  Recommendations may be updated based on patient status, additional functional criteria and insurance authorization.    Follow Up Recommendations  Skilled nursing-short term rehab (<3 hours/day)    Assistance Recommended at Discharge Frequent or constant Supervision/Assistance  Equipment Recommendations  Other (comment) (defer to next venue of care)    Recommendations for Other Services      Precautions / Restrictions Precautions Precautions: Fall Restrictions Weight Bearing Restrictions: No       Mobility Bed Mobility Overal bed mobility: Needs Assistance Bed Mobility: Rolling;Supine to Sit;Sit to Supine Rolling: Max assist   Supine to  sit: Max assist;+2 for physical assistance Sit to supine: Max assist;+2 for physical assistance        Transfers Overall transfer level: Needs assistance Equipment used: Rolling walker (2 wheels) Transfers: Sit to/from Stand Sit to Stand: Max assist;+2 physical assistance;From elevated surface                 Balance Overall balance assessment: Needs assistance Sitting-balance support: Feet supported;Bilateral upper extremity supported Sitting balance-Leahy Scale: Poor Sitting balance - Comments: L lateral lean, slight posterior lean   Standing balance support: Bilateral upper extremity supported;Reliant on assistive device for balance Standing balance-Leahy Scale: Zero                             ADL either performed or assessed with clinical judgement   ADL Overall ADL's : Needs assistance/impaired                                       General ADL Comments: MAX A x2 + RW for attempted ADL t/f - B knee buckling requiring TOTAL A to return to sitting safely. Unsafe to attempt OOB - RN notified. Toileting completed bed level with MAX A.      Cognition Arousal/Alertness: Lethargic Behavior During Therapy: Impulsive Overall Cognitive Status: Impaired/Different from baseline Area of Impairment: Orientation;Following commands;Safety/judgement;Attention;Awareness;Memory;Problem solving                 Orientation Level: Disoriented to;Situation;Place;Time   Memory: Decreased recall of precautions;Decreased short-term memory  Following Commands: Follows one step commands inconsistently Safety/Judgement: Decreased awareness of safety;Decreased awareness of deficits Awareness: Intellectual;Emergent;Anticipatory Problem Solving: Slow processing;Decreased initiation;Requires tactile cues;Requires verbal cues;Difficulty sequencing General Comments: Reports he's in [place] then states [facility], is able to state he's in a hospital when given  choice of 2.                General Comments Pt on 2L/min via nasal cannula with SpO2 89-91%, cuing to breathe through nose vs mouth but pt unable to follow commands.    Pertinent Vitals/ Pain       Pain Assessment: Faces Pain Score: 0-No pain Faces Pain Scale: No hurt   Frequency  Min 2X/week        Progress Toward Goals  OT Goals(current goals can now be found in the care plan section)  Progress towards OT goals: Progressing toward goals  Acute Rehab OT Goals Patient Stated Goal: to go home OT Goal Formulation: With patient Time For Goal Achievement: 04/10/21 Potential to Achieve Goals: Fair ADL Goals Pt Will Perform Grooming: with min guard assist;sitting Pt Will Perform Lower Body Dressing: with mod assist;sit to/from stand Pt Will Transfer to Toilet: with min assist;stand pivot transfer;bedside commode  Plan Discharge plan remains appropriate;Frequency remains appropriate    Co-evaluation    PT/OT/SLP Co-Evaluation/Treatment: Yes Reason for Co-Treatment: For patient/therapist safety;To address functional/ADL transfers;Necessary to address cognition/behavior during functional activity PT goals addressed during session: Mobility/safety with mobility;Balance OT goals addressed during session: ADL's and self-care      AM-PAC OT "6 Clicks" Daily Activity     Outcome Measure   Help from another person eating meals?: A Little Help from another person taking care of personal grooming?: A Lot Help from another person toileting, which includes using toliet, bedpan, or urinal?: A Lot Help from another person bathing (including washing, rinsing, drying)?: A Lot Help from another person to put on and taking off regular upper body clothing?: A Little Help from another person to put on and taking off regular lower body clothing?: A Lot 6 Click Score: 14    End of Session Equipment Utilized During Treatment: Gait belt;Rolling walker (2 wheels)  OT Visit Diagnosis:  Other abnormalities of gait and mobility (R26.89)   Activity Tolerance Patient tolerated treatment well   Patient Left in bed;with call bell/phone within reach;with bed alarm set;with family/visitor present   Nurse Communication Mobility status        Time: 1021-1050 OT Time Calculation (min): 29 min  Charges: OT General Charges $OT Visit: 1 Visit OT Treatments $Self Care/Home Management : 8-22 mins  Dessie Coma, M.S. OTR/L  03/29/21, 1:53 PM  ascom [phone]

## 2021-03-29 NOTE — Progress Notes (Signed)
Initial Nutrition Assessment  DOCUMENTATION CODES:   Not applicable  INTERVENTION:   -MVI with minerals daily -Ensure Enlive po BID, each supplement provides 350 kcal and 20 grams of protein  -30 ml Prosource Plus TID, each supplement provides 100 kcals and 15 grams protein -Magic cup TID with meals, each supplement provides 290 kcal and 9 grams of protein   NUTRITION DIAGNOSIS:   Inadequate oral intake related to lethargy/confusion as evidenced by meal completion < 25%.  GOAL:   Patient will meet greater than or equal to 90% of their needs  MONITOR:   PO intake, Supplement acceptance, Diet advancement, Labs, Weight trends, Skin, I & O's  REASON FOR ASSESSMENT:   Malnutrition Screening Tool    ASSESSMENT:   Matthew Miles. is a 87 y.o. Caucasian male with medical history significant for CHF, COPD, coronary artery disease, type 2 diabetes mellitus, emphysema and GERD, hypertension and dyslipidemia, - currently undergoing evaluation at [facility] for splenic marginal zone B-cell lymphoma and prostate cancer who was recently admitted to [facility] for complications secondary to prostate cancer and lymphoma with obstructive process and likely UTI, ultimately discharged to skilled nursing facility given worsening weakness, questionable reports of sundowning at their facility prior to discharge, patient ultimately worsened while at rehab and presented here with similar episode of sundowning, delirium, hallucinations and mental status changes from baseline as well as profound weakness and general malaise. Neurology and ophthalmology consulted at intake given left eye weakness and vision changes.  Pt admitted with possible wernicke's encephalopathy.   12/18- s/p BSE- advanced to dysphagia 2 diet with thin liquids  Reviewed I/O's: -710 ml x 24 hours and +728 ml since admission  UOP: 950 ml x 24 hours  Attempted to speak with pt x 2, however, pt unavailable at times of visits. RD unable to  obtain further nutrition-related history or complete nutrition-focused physical exam at this time.    Per meal completion records, meal completions 0%.   Pt with increased nutritional needs for wound healing and would benefit from addition of oral nutrition supplements.   Reviewed wt hx; wt has been stable over the past 8 months.   Medications reviewed and include vitamin C, diflucan, potassium chloride, demadex, zinc sulfate, and thiamine.   Lab Results  Component Value Date   HGBA1C 5.8 (H) 03/27/2021   PTA DM medications are 1000 mg metformin BID, 1.5 mg trulicity daily, and 1-4 units insulin lispro TID before meals.   Labs reviewed: Labs reviewed: Na: 134, K: 3.0. Inpatient orders for glycemic control are none.  Labs reviewed: Na: 134, K: 3.0.    Diet Order:   Diet Order             DIET DYS 2 Room service appropriate? Yes with Assist; Fluid consistency: Thin  Diet effective now                   EDUCATION NEEDS:   Not appropriate for education at this time  Skin:  Skin Assessment: Skin Integrity Issues: Skin Integrity Issues:: Stage III Stage III: sacrum  Last BM:  03/29/21  Height:   Ht Readings from Last 1 Encounters:  03/26/21 5\' 4"  (1.626 m)    Weight:   Wt Readings from Last 1 Encounters:  03/26/21 76.7 kg    Ideal Body Weight:  59.1 kg  BMI:  Body mass index is 29.01 kg/m.  Estimated Nutritional Needs:   Kcal:  3151-7616  Protein:  90-105 grams  Fluid:  >  1.7 L    Loistine Chance, RD, LDN, [place] Registered Dietitian II Certified Diabetes Care and Education Specialist Please refer to [facility] for RD and/or RD on-call/weekend/after hours pager

## 2021-03-29 NOTE — Progress Notes (Signed)
Progress Note    Matthew Miles.  WUJ:[id number] DOB: 1929-02-24 DOA: 03/26/2021 PCP: [name], MD   Brief Narrative:  Matthew Miles. is a 87 y.o. Caucasian male with medical history significant for CHF, COPD, coronary artery disease, type 2 diabetes mellitus, emphysema and GERD, hypertension and dyslipidemia, - currently undergoing evaluation at [facility] for splenic marginal zone B-cell lymphoma and prostate cancer who was recently admitted to [facility] for complications secondary to prostate cancer and lymphoma with obstructive process and likely UTI, ultimately discharged to skilled nursing facility given worsening weakness, questionable reports of sundowning at their facility prior to discharge, patient ultimately worsened while at rehab and presented here with similar episode of sundowning, delirium, hallucinations and mental status changes from baseline as well as profound weakness and general malaise. Neurology and ophthalmology consulted at intake given left eye weakness and vision changes.  Assessment & Plan:  Altered mental status with mild delirium and visual hallucinations, improving Likely secondary to prolonged hospitalization complicated by polypharmacy, improving Questionable Wernicke encephalopathy -Baseline previously living alone able to care for himself at home prior to recent hospitalization at [facility] -Much more awake alert oriented to person place family at bedside and situation - Hospital delirium from previous prolonged hospitalization/SNF placement - problem sleeping at [facility] last week -also initiated on oxycodone and trazodone - Stop oxycodone/trazodone/haldol given patient's age and comorbid conditions -Neurology initiating high-dose thiamine given questionable Wernicke's encephalopathy  Goals of care  -Attempted goals of care with daughter at bedside today, lengthy conversation, over 30 minutes discussed patient's current condition, comorbid conditions as below  including multiple cancers worsening physical condition and mental status changes over the past month. -We are hopeful the patient's symptoms are stemming from hospital delirium and polypharmacy as these medications have been held.- If patient does not make any meaningful progress in the next week with physical therapy or does not tolerate p.o. appropriately we discussed he will likely continue to deteriorate at which time it would be more appropriate to discuss his case with palliative care. -Certainly if he continues to improve we will transition patient back to nursing facility for ongoing physical therapy.   -He remains full code  Left Internuclear Ophthalmoplegia, POA - Likely secondary to vertebrobasilar insufficiency on MRA without overt aneurysm or stroke - Ophthalmology and neurology following, appreciate insight and recommendations - Continue conservative management -outpatient follow-up as scheduled  Splenic marginal zone b-cell lymphoma History of prostate cancer -Currently following at [facility] [place] -Given patient's worsening mental status fatigue and general deconditioning he is likely not a candidate for any ongoing therapy at this time, palliative care discussion as above  Dehydration, hypovolemic hyponatremia. - Continue IV fluids, follow repeat labs  BPH. - Continue Uroxatrol and Proscar -continue Foley catheter(POA) placed at previous hospital for ongoing obstructive process  Non insulin-dependent diabetes type 2  - Continue sliding scale insulin, hold home metformin  Sacral decubitus ulcer, POA - See nursing/WOC documentation  Dyslipidemia. -Continue statin  DVT prophylaxis: Lovenox. Code Status: full code. Family Communication: Lengthy discussion with daughter at bedside  Status is: inpt  Dispo: The patient is from: SNF              Anticipated d/c is to: SNF              Anticipated d/c date is: 24-48h              Patient currently NOT medically  stable for discharge  Consultants:  Neuro, Optho  Procedures:  None  Antimicrobials:  None   Subjective: No acute issues/events overnight, patient more awake alert today answering questions more appropriately denies nausea vomiting diarrhea constipation headache fevers chills or chest pain.  Objective: Vitals:   03/28/21 1544 03/28/21 2008 03/28/21 2246 03/29/21 0331  BP: 109/81 (!) 108/55 105/62 (!) 104/46  Pulse: 77 68 70 65  Resp: 20 18 (!) 22 18  Temp: 97.9 F (36.6 C) 97.6 F (36.4 C) 97.9 F (36.6 C) 97.9 F (36.6 C)  TempSrc:  Oral Oral Oral  SpO2: 90% 97% 95% 100%  Weight:      Height:        Intake/Output Summary (Last 24 hours) at 03/29/2021 1749 Last data filed at 03/29/2021 0331 Gross per 24 hour  Intake 240 ml  Output 950 ml  Net -710 ml    Filed Weights   03/26/21 1645  Weight: 76.7 kg    Examination:  General exam: Appears calm and comfortable, oriented to person place and general situation(understands to the hospital, not at [facility] -daughter [name] is at bedside and following commands appropriately) Respiratory system: Clear to auscultation. Respiratory effort normal. Cardiovascular system: S1 & S2 heard, RRR. No JVD, murmurs, rubs, gallops or clicks. No pedal edema. Gastrointestinal system: Abdomen is nondistended, soft and nontender. No organomegaly or masses felt. Normal bowel sounds heard. Extremities: Bilateral lower extremity strength 3 out of 5, left upper extremity 4 out of 5, right upper extremity 3 out of 5 limited range of motion due to pain. Skin: Sacral decubitus ulcer (see above)  Data Reviewed: I have personally reviewed following labs and imaging studies  CBC: Recent Labs  Lab 03/26/21 1641 03/28/21 0451 03/29/21 0419  WBC 10.7* 9.1 8.3  NEUTROABS 8.7*  --   --   HGB 10.4* 9.1* 8.5*  HCT 31.1* 27.5* 26.1*  MCV 95.4 94.2 96.7  PLT 161 144* 136*    Basic Metabolic Panel: Recent Labs  Lab 03/26/21 1641 03/28/21 0451  03/29/21 0419  NA 127* 131* 134*  K 4.1 3.4* 3.0*  CL 93* 97* 98  CO2 27 25 29   GLUCOSE 184* 165* 202*  BUN 25* 21 24*  CREATININE 1.00 1.02 1.12  CALCIUM 8.3* 7.8* 7.9*    GFR: Estimated Creatinine Clearance: 41 mL/min (by C-G formula based on SCr of 1.12 mg/dL). Liver Function Tests: Recent Labs  Lab 03/26/21 1646  AST 76*  ALT 116*  ALKPHOS 517*  BILITOT 0.8  PROT 6.1*  ALBUMIN 2.8*    No results for input(s): LIPASE, AMYLASE in the last 168 hours. No results for input(s): AMMONIA in the last 168 hours. Coagulation Profile: No results for input(s): INR, PROTIME in the last 168 hours. Cardiac Enzymes: No results for input(s): CKTOTAL, CKMB, CKMBINDEX, TROPONINI in the last 168 hours. BNP (last 3 results) No results for input(s): PROBNP in the last 8760 hours. HbA1C: No results for input(s): HGBA1C in the last 72 hours. CBG: No results for input(s): GLUCAP in the last 168 hours. Lipid Profile: Recent Labs    03/27/21 0517  CHOL 132  HDL 24*  LDLCALC 74  TRIG 168*  CHOLHDL 5.5    Thyroid Function Tests: No results for input(s): TSH, T4TOTAL, FREET4, T3FREE, THYROIDAB in the last 72 hours. Anemia Panel: No results for input(s): VITAMINB12, FOLATE, FERRITIN, TIBC, IRON, RETICCTPCT in the last 72 hours. Sepsis Labs: Recent Labs  Lab 03/26/21 1650  LATICACIDVEN 1.4     Recent Results (from the past 240 hour(s))  Culture, blood (routine  x 2)     Status: None (Preliminary result)   Collection Time: 03/26/21  6:15 PM   Specimen: BLOOD  Result Value Ref Range Status   Specimen Description BLOOD LEFT ANTECUBITAL  Final   Special Requests   Final    BOTTLES DRAWN AEROBIC AND ANAEROBIC Blood Culture adequate volume   Culture   Final    NO GROWTH 3 DAYS Performed at [facility], [address], [place], [place] [address]    Report Status PENDING  Incomplete  Culture, blood (routine x 2)     Status: None (Preliminary result)   Collection Time:  03/26/21  6:22 PM   Specimen: BLOOD  Result Value Ref Range Status   Specimen Description BLOOD BLOOD LEFT HAND  Final   Special Requests   Final    BOTTLES DRAWN AEROBIC AND ANAEROBIC Blood Culture adequate volume   Culture   Final    NO GROWTH 3 DAYS Performed at [facility], [address], [place], [place] [address]    Report Status PENDING  Incomplete  Resp Panel by RT-PCR (Flu A&B, Covid) Nasopharyngeal Swab     Status: None   Collection Time: 03/27/21 12:02 AM   Specimen: Nasopharyngeal Swab; Nasopharyngeal(NP) swabs in vial transport medium  Result Value Ref Range Status   SARS Coronavirus 2 by RT PCR NEGATIVE NEGATIVE Final    Comment: (NOTE) SARS-CoV-2 target nucleic acids are NOT DETECTED.  The SARS-CoV-2 RNA is generally detectable in upper respiratory specimens during the acute phase of infection. The lowest concentration of SARS-CoV-2 viral copies this assay can detect is 138 copies/mL. A negative result does not preclude SARS-Cov-2 infection and should not be used as the sole basis for treatment or other patient management decisions. A negative result may occur with  improper specimen collection/handling, submission of specimen other than nasopharyngeal swab, presence of viral mutation(s) within the areas targeted by this assay, and inadequate number of viral copies(<138 copies/mL). A negative result must be combined with clinical observations, patient history, and epidemiological information. The expected result is Negative.  Fact Sheet for Patients:  EntrepreneurPulse.com.au  Fact Sheet for Healthcare Providers:  IncredibleEmployment.be  This test is no t yet approved or cleared by the [place] FDA and  has been authorized for detection and/or diagnosis of SARS-CoV-2 by FDA under an Emergency Use Authorization (EUA). This EUA will remain  in effect (meaning this test can be used) for the duration of  the COVID-19 declaration under Section 564(b)(1) of the Act, 21 U.S.C.section 360bbb-3(b)(1), unless the authorization is terminated  or revoked sooner.       Influenza A by PCR NEGATIVE NEGATIVE Final   Influenza B by PCR NEGATIVE NEGATIVE Final    Comment: (NOTE) The Xpert Xpress SARS-CoV-2/FLU/RSV plus assay is intended as an aid in the diagnosis of influenza from Nasopharyngeal swab specimens and should not be used as a sole basis for treatment. Nasal washings and aspirates are unacceptable for Xpert Xpress SARS-CoV-2/FLU/RSV testing.  Fact Sheet for Patients: EntrepreneurPulse.com.au  Fact Sheet for Healthcare Providers: IncredibleEmployment.be  This test is not yet approved or cleared by the [place] FDA and has been authorized for detection and/or diagnosis of SARS-CoV-2 by FDA under an Emergency Use Authorization (EUA). This EUA will remain in effect (meaning this test can be used) for the duration of the COVID-19 declaration under Section 564(b)(1) of the Act, 21 U.S.C. section 360bbb-3(b)(1), unless the authorization is terminated or revoked.  Performed at [facility],  [place], [place] [address]   Urine Culture     Status: None   Collection Time: 03/27/21 12:10 AM   Specimen: Urine, Random  Result Value Ref Range Status   Specimen Description   Final    URINE, RANDOM Performed at [facility], [address], [place], [place] [address]    Special Requests   Final    NONE Performed at [facility], [address], [place], [place] [address]    Culture   Final    NO GROWTH Performed at [facility], [place] [address], [place], [place] [address]    Report Status 03/28/2021 FINAL  Final  MRSA Next Gen by PCR, Nasal     Status: None   Collection Time: 03/27/21 11:19 PM   Specimen: Nasal Mucosa; Nasal Swab  Result Value Ref Range Status   MRSA by PCR Next Gen NOT  DETECTED NOT DETECTED Final    Comment: (NOTE) The GeneXpert MRSA Assay (FDA approved for NASAL specimens only), is one component of a comprehensive MRSA colonization surveillance program. It is not intended to diagnose MRSA infection nor to guide or monitor treatment for MRSA infections. Test performance is not FDA approved in patients less than 26 years old. Performed at [facility], [address], [place],  [address]    Radiology Studies: No results found.  Scheduled Meds:  alfuzosin  10 mg Oral Q breakfast   ascorbic acid  500 mg Oral BID   aspirin EC  81 mg Oral Daily   carvedilol  12.5 mg Oral BID WC   Chlorhexidine Gluconate Cloth  6 each Topical Daily   enoxaparin (LOVENOX) injection  40 mg Subcutaneous Q24H   ferrous sulfate  325 mg Oral Daily   finasteride  5 mg Oral Daily   fluconazole  100 mg Oral Daily   fluticasone  2 spray Each Nare Daily   lidocaine  1 patch Transdermal Q24H   mouth rinse  15 mL Mouth Rinse BID   melatonin  2.5 mg Oral QHS   multivitamin with minerals  1 tablet Oral Daily   pantoprazole  20 mg Oral Daily   pravastatin  40 mg Oral Q2000   tiotropium  1 capsule Inhalation Daily   torsemide  20 mg Oral Daily   valACYclovir  500 mg Oral BID   zinc sulfate  220 mg Oral Daily   Continuous Infusions:  sodium chloride Stopped (03/28/21 2319)   thiamine injection 500 mg (03/28/21 2213)    LOS: 2 days   Time spent: 67min  Kaveh Kissinger C Develle Sievers, DO [facility]  If 7PM-7AM, please contact night-coverage [email or web address]  03/29/2021, 7:07 AM

## 2021-03-30 LAB — CBC
HCT: 27.3 % — ABNORMAL LOW (ref 39.0–52.0)
Hemoglobin: 8.7 g/dL — ABNORMAL LOW (ref 13.0–17.0)
MCH: 31.1 pg (ref 26.0–34.0)
MCHC: 31.9 g/dL (ref 30.0–36.0)
MCV: 97.5 fL (ref 80.0–100.0)
Platelets: 120 10*3/uL — ABNORMAL LOW (ref 150–400)
RBC: 2.8 MIL/uL — ABNORMAL LOW (ref 4.22–5.81)
RDW: 14.7 % (ref 11.5–15.5)
WBC: 9.9 10*3/uL (ref 4.0–10.5)
nRBC: 0 % (ref 0.0–0.2)

## 2021-03-30 LAB — BASIC METABOLIC PANEL
Anion gap: 6 (ref 5–15)
BUN: 29 mg/dL — ABNORMAL HIGH (ref 8–23)
CO2: 28 mmol/L (ref 22–32)
Calcium: 8.1 mg/dL — ABNORMAL LOW (ref 8.9–10.3)
Chloride: 101 mmol/L (ref 98–111)
Creatinine, Ser: 1.33 mg/dL — ABNORMAL HIGH (ref 0.61–1.24)
GFR, Estimated: 51 mL/min — ABNORMAL LOW (ref >60)
Glucose, Bld: 236 mg/dL — ABNORMAL HIGH (ref 70–99)
Potassium: 3.7 mmol/L (ref 3.5–5.1)
Sodium: 135 mmol/L (ref 135–145)

## 2021-03-30 MED ORDER — ATORVASTATIN CALCIUM 20 MG PO TABS
40.0000 mg | ORAL_TABLET | Freq: Every day | ORAL | Status: DC
Start: 2021-03-30 — End: 2021-04-01
  Administered 2021-03-30 – 2021-03-31 (×2): 40 mg via ORAL
  Filled 2021-03-30 (×2): qty 2

## 2021-03-30 NOTE — Evaluation (Signed)
Physical Therapy Evaluation Patient Details Name: Matthew Miles. MRN: [id number] DOB: September 30, 1928 Today's Date: 03/30/2021  History of Present Illness  Pt is a 87 y/o M admitted on 03/26/21 with c/c of acute onset of generalized weakness & malaise & inability to open the L eye. CT & brain MRI showed no acute abnormalities. 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 PMH: CHF, COPD, CAD, DM2, emphyseam, GERD, HTN, dyslipidemia, prostate CA  Clinical Impression  Patient agreeable to PT. Partial co-treatment with OT as patient continues to require +2 person assistance for sit to stand transfers and to maintain standing balance. Patient has bilateral knees buckling with weight shifting in standing and was unsafe to progress ambulation at this time. Recommend to continue PT to maximize independence and decrease caregiver burden.       Recommendations for follow up therapy are one component of a multi-disciplinary discharge planning process, led by the attending physician.  Recommendations may be updated based on patient status, additional functional criteria and insurance authorization.  Follow Up Recommendations Skilled nursing-short term rehab (<3 hours/day)    Assistance Recommended at Discharge Frequent or constant Supervision/Assistance  Functional Status Assessment    Equipment Recommendations   (to be determined at next level of care)    Recommendations for Other Services       Precautions / Restrictions Precautions Precautions: Fall Restrictions Weight Bearing Restrictions: No      Mobility  Bed Mobility Overal bed mobility: Needs Assistance Bed Mobility: Supine to Sit     Supine to sit: Max assist Sit to supine: Max assist   General bed mobility comments: verbal cues for sequencing and technique. increased time and effort required to complete tasks    Transfers Overall transfer level: Needs assistance Equipment used:  Rolling walker (2 wheels) Transfers: Sit to/from Stand Sit to Stand: Max assist;+2 physical assistance           General transfer comment: verbal cues for technique, hand placement. 2 bouts of standing performed. patient was able to scoot x 3 bouts to the right in sitting position with Mod A and cues for sequencing    Ambulation/Gait               General Gait Details: patient has knee buckling with weight shifting in standing position with maximal assistance of 2 person. no safe to attempt further ambulation at this time  Stairs            Wheelchair Mobility    Modified Rankin (Stroke Patients Only)       Balance Overall balance assessment: Needs assistance Sitting-balance support: Feet supported;Bilateral upper extremity supported Sitting balance-Leahy Scale: Fair     Standing balance support: Bilateral upper extremity supported;Reliant on assistive device for balance Standing balance-Leahy Scale: Zero Standing balance comment: +2 person assistance required to maintain standing balance. bilateral knees buckling with weight bearing                             Pertinent Vitals/Pain Pain Assessment: No/denies pain    Home Living                          Prior Function                       Hand Dominance  Extremity/Trunk Assessment                Communication      Cognition Arousal/Alertness: Lethargic Behavior During Therapy: Impulsive Overall Cognitive Status: No family/caregiver present to determine baseline cognitive functioning                         Following Commands: Follows one step commands inconsistently Safety/Judgement: Decreased awareness of safety;Decreased awareness of deficits              General Comments General comments (skin integrity, edema, etc.): Sp02 98% on 2 L02 and heart rate 65 bpm while seated.    Exercises General Exercises - Lower Extremity Ankle  Circles/Pumps: AROM;Strengthening;Both;10 reps;Supine Short Arc Quad: AAROM;Strengthening;Both;10 reps;Supine Long Arc Quad: AAROM;Strengthening;Both;10 reps;Seated Hip ABduction/ADduction: AAROM;Strengthening;Both;10 reps;Supine Other Exercises Other Exercises: verbal cues for technique for strengthening   Assessment/Plan    PT Assessment    PT Problem List         PT Treatment Interventions      PT Goals (Current goals can be found in the Care Plan section)  Acute Rehab PT Goals Patient Stated Goal: none stated PT Goal Formulation: Patient unable to participate in goal setting Time For Goal Achievement: 04/10/21 Potential to Achieve Goals: Fair    Frequency Min 2X/week   Barriers to discharge        Co-evaluation PT/OT/SLP Co-Evaluation/Treatment: Yes (partial co-treatment) Reason for Co-Treatment: To address functional/ADL transfers PT goals addressed during session: Mobility/safety with mobility OT goals addressed during session: ADL's and self-care       AM-PAC PT "6 Clicks" Mobility  Outcome Measure Help needed turning from your back to your side while in a flat bed without using bedrails?: A Lot Help needed moving from lying on your back to sitting on the side of a flat bed without using bedrails?: A Lot Help needed moving to and from a bed to a chair (including a wheelchair)?: Total Help needed standing up from a chair using your arms (e.g., wheelchair or bedside chair)?: Total Help needed to walk in hospital room?: Total Help needed climbing 3-5 steps with a railing? : Total 6 Click Score: 8    End of Session Equipment Utilized During Treatment: Gait belt;Oxygen Activity Tolerance: Patient limited by fatigue Patient left: in bed (OT at bedside)   PT Visit Diagnosis: Unsteadiness on feet (R26.81);Difficulty in walking, not elsewhere classified (R26.2);Muscle weakness (generalized) (M62.81)    Time: 0623-7628 PT Time Calculation (min) (ACUTE ONLY): 17  min   Charges:     PT Treatments $Therapeutic Activity: 8-22 mins        Minna Merritts, PT, MPT  Percell Locus 03/30/2021, 3:17 PM

## 2021-03-30 NOTE — Progress Notes (Signed)
Speech Language Pathology Treatment: Dysphagia  Patient Details Name: Matthew Miles. MRN: [id number] DOB: 05-Apr-1929 Today's Date: 03/30/2021 Time: 9528-4132 SLP Time Calculation (min) (ACUTE ONLY): 60 min  Assessment / Plan / Recommendation Clinical Impression  Pt seen for ongoing assessment of swallowing; toleration of diet. He is currently on a Dys. Level 2 Minced foods, w/ thin liquids. His Dentures are ill-fitting so this Clinician instructed Family and pt to leave the out of his mouth d/t concern for creating ulcerations in the mouth as well as choking hazard. He is awake, verbally responsive and engaged in responses w/ SLP; min HOH. Noted pt maintained Closed Eyes often during session -- unsure of pt's Baseline Cognitive functioning(see Head Imaging); also, pt's Family stated he lived alone. Pt is on [place] O2 support; wbc wnl.  Pt explained general aspiration precautions and agreed verbally to the need for following them especially sitting upright for all oral intake -- supported pt fully in sitting upright in the bed. Son present was not aware pt needed to be sitting up this fully (upright) in the bed. Pt was also weak and could not help much to sit himself upright in the bed.  He assisted in feeding himself by holding cup to drink thin liquids via cup/straw but required FULL feeding support for puree and soft solid trials. No immediate, overt clinical s/s of aspiration were noted w/ any consistency; respiratory status remained calm and unlabored, vocal quality clear b/t trials. Pt exhibited a mild, delayed throat clearing post thin liquids x1 but no further overt deficits noted. Discussed w/ Son the risk for aspiration of thin liquids at advanced age w/ illness as well as drinking in bed; discussed the importance of swallowing Pills in a Puree -- the Puree providing cohesion for swallowing tablets. Son agreed. Oral phase appeared grossly [facility] for bolus management and timely A-P transfer for  swallowing; min extra Time needed for mashing/gumming the minced/moist trials(solid) d/t Edentulous status. Oral clearing achieved w/ all consistencies. Adequate bolus management of the thin liquids noted.    Pt appears at Reduced risk for aspiration when following aspiration precautions and using a Minced diet when not eating w/ Dentures(ill-fitting currently). Recommend dys. Level 2(MINCED FOODS) diet for ease of oral phase w/ gravies added to moisten foods; Thin liquids -- monitor all straw drinking and remove straws IF increased coughing noted. Recommend aspiration precautions; Pills Whole vs Crushed in Puree; tray setup and positioning assistance for meals. Support w/ feeding at meals as needed. REFLUX precautions d/t pt's baseline. ST services will monitor toleration of diet x1. NSG updated. Precautions posted at bedside.     HPI HPI: Per 59 H&P "Matthew Miles. is a 87 y.o. Caucasian male with medical history significant for CHF, COPD, coronary artery disease, type 2 diabetes mellitus, emphysema and GERD, hypertension and dyslipidemia, who presented to the ER with acute onset of generalized weakness and malaise and inability to open the left eye.  He was noted to be unable to move his left eye immediately.  No blurred vision or diplopia.  No paresthesias or focal muscle weakness.  No headache or dizziness.  No vertigo or tinnitus.  He has been having visual hallucinations and has been trying to hold to things in the ER.  No dysuria, oliguria or hematuria or flank pain.     ED Course: When he came to the ER vital signs were within normal.  Labs revealed hyponatremia 127 with hypochloremia of 93.  Blood glucose was 184 and  BUN 25.  Alk phos was significantly elevated at 517 with albumin of 2.8, AST of 76 and ALT of 116 with protein of 6.1 and lactic acid was 1.4.  CBC showed WBC of 10.7 and anemia close to baseline.     Imaging: Noncontrast head CT scan revealed chronic atrophic and ischemic  changes without acute abnormality.  Brain MRI without contrast showed no acute abnormalities.  He had an MRA of the head and neck which revealed the following:  1. The right vertebral artery is not well visualized for the  entirety of its course, both extra cranially and intracranially,  likely occluded although slow flow or severe stenosis can appear  similar. Consider CTA head and neck for further evaluation.  2. Fusiform dilatation of the right cavernous ICA.  3. 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  4. Focal stenosis in the left P1 and left A1. Narrowing is also  noted at the origin of the right P3 branch.  5. The origin of the left vertebral artery and left common carotid  artery are not well visualized, secondary to artifact.".  CXR: "Interval development of small bilateral pleural effusions with  associated left basilar atelectasis.".      SLP Plan  Continue with current plan of care (x1 po check)      Recommendations for follow up therapy are one component of a multi-disciplinary discharge planning process, led by the attending physician.  Recommendations may be updated based on patient status, additional functional criteria and insurance authorization.    Recommendations  Diet recommendations: Dysphagia 2 (fine chop);Thin liquid Liquids provided via: Cup;Straw (monitor) Medication Administration: Whole meds with puree (vs Crushed for safer swallowing) Supervision: Patient able to self feed;Staff to assist with self feeding;Full supervision/cueing for compensatory strategies Compensations: Minimize environmental distractions;Small sips/bites;Lingual sweep for clearance of pocketing;Follow solids with liquid Postural Changes and/or Swallow Maneuvers: Out of bed for meals;Seated upright 90 degrees;Upright 30-60 min after meal                General recommendations:  (Dietician f/u; Palliative Care f/u for education) Oral Care Recommendations:  Oral care BID;Oral care before and after PO;Staff/trained caregiver to provide oral care Follow Up Recommendations: Skilled nursing-short term rehab (<3 hours/day) (for ongoing monitoring of toleration of diet and education w/ Family on dysphagia/aging) Assistance recommended at discharge: Frequent or constant Supervision/Assistance SLP Visit Diagnosis: Dysphagia, oropharyngeal phase (R13.12) Plan: Continue with current plan of care (x1 po check)            Orinda Kenner, [place], CCC-SLP Speech Language Pathologist Rehab Services [phone]  [facility]  03/30/2021, 4:21 PM

## 2021-03-30 NOTE — Progress Notes (Signed)
Progress Note    Matthew Miles.  UVO:[id number] DOB: [birth year] DOA: 03/26/2021 PCP: [name], MD   Brief Narrative:  Matthew Miles. is a 87 y.o. Caucasian male with medical history significant for CHF, COPD, coronary artery disease, type 2 diabetes mellitus, emphysema and GERD, hypertension and dyslipidemia, - currently undergoing evaluation at [facility] for splenic marginal zone B-cell lymphoma and prostate cancer who was recently admitted to [facility] for complications secondary to prostate cancer and lymphoma with obstructive process and likely UTI, ultimately discharged to skilled nursing facility given worsening weakness, questionable reports of sundowning at their facility prior to discharge, patient ultimately worsened while at rehab and presented here with similar episode of sundowning, delirium, hallucinations and mental status changes from baseline as well as profound weakness and general malaise. Neurology and ophthalmology consulted at intake given left eye weakness and vision changes.  Assessment & Plan:  Altered mental status with mild delirium and visual hallucinations, improving Likely secondary to prolonged hospitalization complicated by polypharmacy, improving Questionable underlying Wernicke encephalopathy -Baseline previously living alone able to care for himself at home prior to recent hospitalization at [facility], alert, oriented to person place family and current situation.  Appears to be approaching baseline per family at bedside -Primary etiology for patient's change in mental status appears to be hospital delirium from previous prolonged hospitalization/SNF placement - problem sleeping at [facility] last week -also initiated on oxycodone and trazodone over the past week - Stop oxycodone/trazodone/haldol given patient's age and comorbid conditions - Neurology initiating high-dose thiamine given questionable Wernicke's encephalopathy  Goals of care  -Lengthy  discussion with son and daughter at bedside today, we discussed that patient is improving somewhat but unclear if he will ever return back to his previous baseline.  In light of his multiple cancer diagnoses as below being followed at [facility] for treatment we discussed he would likely not be a candidate for treatment in the next week to 2 weeks given his acute illness and that they would benefit from a discussion with palliative care for planning in case the patient does not continue to improve as they hope he will.  Given patient's vision changes weakness, mental status changes high risk for falls as well as ongoing cancer he is extremely high risk for morbidity mortality, we discussed that putting patient through CPR and placing him on a ventilator should something happen would likely only prolong patient suffering should he continue to decline.  At this time the patient does remain full code.  Left Internuclear Ophthalmoplegia, POA - Likely secondary to vertebrobasilar insufficiency on MRA without overt aneurysm or stroke - Ophthalmology and neurology previously following, appreciate insight and recommendations - Continue conservative management -outpatient follow-up as scheduled  Splenic marginal zone b-cell lymphoma History of prostate cancer -Currently following at [facility] on [address] -Attempted to call patient's oncology office x2 over the past 48 hours, no callback at this time, unclear what goals of care are at this office given limited documentation on our end -Likely not a candidate for any ongoing therapy at this time, palliative care discussion ongoing daily as above  Dehydration, hypovolemic hyponatremia, improving. - Continue IV fluids, follow repeat labs -Hypotension overnight improving with increased p.o. intake, continue to hold antihypertensive medications as appropriate -continue to hold torsemide and carvedilol  BPH. - Continue Uroxatrol and Proscar -continue Foley  catheter(subacute placement POA) placed at previous hospital for ongoing obstructive process -has not yet followed up with urology  Non insulin-dependent diabetes type 2  -  Continue sliding scale insulin, hold home metformin  Sacral decubitus ulcer, POA - See nursing/WOC documentation  Dyslipidemia. -Continue statin  DVT prophylaxis: Lovenox. Code Status: full code. Family Communication: Lengthy discussion with daughter at bedside  Status is: inpt  Dispo: The patient is from: SNF              Anticipated d/c is to: SNF              Anticipated d/c date is: 24-48h              Patient currently NOT medically stable for discharge  Consultants:  Neuro, Optho  Procedures:  None  Antimicrobials:  None   Subjective: Hypotensive overnight, mild but improving with IV fluids off carvedilol and torsemide.  Patient much more awake and alert this morning difficulty with vision due to eyelid drooping but much sharper mentally.  Family at bedside indicates he is not yet back to baseline but markedly improving, no longer hallucinating and sleeping better overnight.  Patient denies nausea vomiting diarrhea constipation headache fevers chills or chest pain.  Objective: Vitals:   03/29/21 1930 03/29/21 2041 03/30/21 0032 03/30/21 0402  BP: (!) 86/41 104/68 (!) 109/46 (!) 111/47  Pulse: 69  60 (!) 57  Resp: 18  15 15   Temp: 98.2 F (36.8 C)  98.4 F (36.9 C) 97.7 F (36.5 C)  TempSrc:      SpO2: 94%  91% 99%  Weight:      Height:        Intake/Output Summary (Last 24 hours) at 03/30/2021 0701 Last data filed at 03/29/2021 1855 Gross per 24 hour  Intake 0 ml  Output --  Net 0 ml    Filed Weights   03/26/21 1645  Weight: 76.7 kg    Examination:  General exam: Appears calm and comfortable, oriented to person place and general situation(understands to the hospital, not at [facility] -daughter Jaimon Bugaj are at bedside and following commands appropriately) Respiratory  system: Clear to auscultation. Respiratory effort normal. Cardiovascular system: S1 & S2 heard, RRR. No JVD, murmurs, rubs, gallops or clicks. No pedal edema. Gastrointestinal system: Abdomen is nondistended, soft and nontender. No organomegaly or masses felt. Normal bowel sounds heard. Extremities: 4 out of 5 strength globally, right upper extremity range of motion limited by pain Skin: Sacral decubitus ulcer (see above)  Data Reviewed: I have personally reviewed following labs and imaging studies  CBC: Recent Labs  Lab 03/26/21 1641 03/28/21 0451 03/29/21 0419 03/30/21 0428  WBC 10.7* 9.1 8.3 9.9  NEUTROABS 8.7*  --   --   --   HGB 10.4* 9.1* 8.5* 8.7*  HCT 31.1* 27.5* 26.1* 27.3*  MCV 95.4 94.2 96.7 97.5  PLT 161 144* 136* 120*    Basic Metabolic Panel: Recent Labs  Lab 03/26/21 1641 03/28/21 0451 03/29/21 0419 03/30/21 0428  NA 127* 131* 134* 135  K 4.1 3.4* 3.0* 3.7  CL 93* 97* 98 101  CO2 27 25 29 28   GLUCOSE 184* 165* 202* 236*  BUN 25* 21 24* 29*  CREATININE 1.00 1.02 1.12 1.33*  CALCIUM 8.3* 7.8* 7.9* 8.1*    GFR: Estimated Creatinine Clearance: 34.6 mL/min (A) (by C-G formula based on SCr of 1.33 mg/dL (H)). Liver Function Tests: Recent Labs  Lab 03/26/21 1646  AST 76*  ALT 116*  ALKPHOS 517*  BILITOT 0.8  PROT 6.1*  ALBUMIN 2.8*    No results for input(s): LIPASE, AMYLASE in the last 168 hours. No  results for input(s): AMMONIA in the last 168 hours. Coagulation Profile: No results for input(s): INR, PROTIME in the last 168 hours. Cardiac Enzymes: No results for input(s): CKTOTAL, CKMB, CKMBINDEX, TROPONINI in the last 168 hours. BNP (last 3 results) No results for input(s): PROBNP in the last 8760 hours. HbA1C: No results for input(s): HGBA1C in the last 72 hours. CBG: No results for input(s): GLUCAP in the last 168 hours. Lipid Profile: No results for input(s): CHOL, HDL, LDLCALC, TRIG, CHOLHDL, LDLDIRECT in the last 72 hours.  Thyroid  Function Tests: No results for input(s): TSH, T4TOTAL, FREET4, T3FREE, THYROIDAB in the last 72 hours. Anemia Panel: No results for input(s): VITAMINB12, FOLATE, FERRITIN, TIBC, IRON, RETICCTPCT in the last 72 hours. Sepsis Labs: Recent Labs  Lab 03/26/21 1650  LATICACIDVEN 1.4     Recent Results (from the past 240 hour(s))  Culture, blood (routine x 2)     Status: None (Preliminary result)   Collection Time: 03/26/21  6:15 PM   Specimen: BLOOD  Result Value Ref Range Status   Specimen Description BLOOD LEFT ANTECUBITAL  Final   Special Requests   Final    BOTTLES DRAWN AEROBIC AND ANAEROBIC Blood Culture adequate volume   Culture   Final    NO GROWTH 4 DAYS Performed at [facility], [address], [place], [place] [address]    Report Status PENDING  Incomplete  Culture, blood (routine x 2)     Status: None (Preliminary result)   Collection Time: 03/26/21  6:22 PM   Specimen: BLOOD  Result Value Ref Range Status   Specimen Description BLOOD BLOOD LEFT HAND  Final   Special Requests   Final    BOTTLES DRAWN AEROBIC AND ANAEROBIC Blood Culture adequate volume   Culture   Final    NO GROWTH 4 DAYS Performed at [facility], [address], [place], [place] [address]    Report Status PENDING  Incomplete  Resp Panel by RT-PCR (Flu A&B, Covid) Nasopharyngeal Swab     Status: None   Collection Time: 03/27/21 12:02 AM   Specimen: Nasopharyngeal Swab; Nasopharyngeal(NP) swabs in vial transport medium  Result Value Ref Range Status   SARS Coronavirus 2 by RT PCR NEGATIVE NEGATIVE Final    Comment: (NOTE) SARS-CoV-2 target nucleic acids are NOT DETECTED.  The SARS-CoV-2 RNA is generally detectable in upper respiratory specimens during the acute phase of infection. The lowest concentration of SARS-CoV-2 viral copies this assay can detect is 138 copies/mL. A negative result does not preclude SARS-Cov-2 infection and should not be used as the sole basis for  treatment or other patient management decisions. A negative result may occur with  improper specimen collection/handling, submission of specimen other than nasopharyngeal swab, presence of viral mutation(s) within the areas targeted by this assay, and inadequate number of viral copies(<138 copies/mL). A negative result must be combined with clinical observations, patient history, and epidemiological information. The expected result is Negative.  Fact Sheet for Patients:  EntrepreneurPulse.com.au  Fact Sheet for Healthcare Providers:  IncredibleEmployment.be  This test is no t yet approved or cleared by the [place] FDA and  has been authorized for detection and/or diagnosis of SARS-CoV-2 by FDA under an Emergency Use Authorization (EUA). This EUA will remain  in effect (meaning this test can be used) for the duration of the COVID-19 declaration under Section 564(b)(1) of the Act, 21 U.S.C.section 360bbb-3(b)(1), unless the authorization is terminated  or revoked sooner.  Influenza A by PCR NEGATIVE NEGATIVE Final   Influenza B by PCR NEGATIVE NEGATIVE Final    Comment: (NOTE) The Xpert Xpress SARS-CoV-2/FLU/RSV plus assay is intended as an aid in the diagnosis of influenza from Nasopharyngeal swab specimens and should not be used as a sole basis for treatment. Nasal washings and aspirates are unacceptable for Xpert Xpress SARS-CoV-2/FLU/RSV testing.  Fact Sheet for Patients: EntrepreneurPulse.com.au  Fact Sheet for Healthcare Providers: IncredibleEmployment.be  This test is not yet approved or cleared by the [place] FDA and has been authorized for detection and/or diagnosis of SARS-CoV-2 by FDA under an Emergency Use Authorization (EUA). This EUA will remain in effect (meaning this test can be used) for the duration of the COVID-19 declaration under Section 564(b)(1) of the Act, 21  U.S.C. section 360bbb-3(b)(1), unless the authorization is terminated or revoked.  Performed at [facility], [address] [place], [place] [address]   Urine Culture     Status: None   Collection Time: 03/27/21 12:10 AM   Specimen: Urine, Random  Result Value Ref Range Status   Specimen Description   Final    URINE, RANDOM Performed at [facility], [address] [place], [place] [address]    Special Requests   Final    NONE Performed at [facility], [address], [place], [place] [address]    Culture   Final    NO GROWTH Performed at [facility], [place] [address], [place], [place] [address]    Report Status 03/28/2021 FINAL  Final  MRSA Next Gen by PCR, Nasal     Status: None   Collection Time: 03/27/21 11:19 PM   Specimen: Nasal Mucosa; Nasal Swab  Result Value Ref Range Status   MRSA by PCR Next Gen NOT DETECTED NOT DETECTED Final    Comment: (NOTE) The GeneXpert MRSA Assay (FDA approved for NASAL specimens only), is one component of a comprehensive MRSA colonization surveillance program. It is not intended to diagnose MRSA infection nor to guide or monitor treatment for MRSA infections. Test performance is not FDA approved in patients less than 5 years old. Performed at [facility], [address], [place], [place] [address]    Radiology Studies: No results found.  Scheduled Meds:  (feeding supplement) PROSource Plus  30 mL Oral TID BM   alfuzosin  10 mg Oral Q breakfast   ascorbic acid  500 mg Oral BID   aspirin EC  81 mg Oral Daily   atorvastatin  40 mg Oral QHS   carvedilol  12.5 mg Oral BID WC   Chlorhexidine Gluconate Cloth  6 each Topical Daily   dextromethorphan-guaiFENesin  1 tablet Oral BID   enoxaparin (LOVENOX) injection  40 mg Subcutaneous Q24H   feeding supplement  237 mL Oral BID BM   ferrous sulfate  325 mg Oral Daily   finasteride  5 mg Oral Daily   fluconazole  100 mg Oral Daily    fluticasone  2 spray Each Nare Daily   lidocaine  1 patch Transdermal Q24H   mouth rinse  15 mL Mouth Rinse BID   melatonin  2.5 mg Oral QHS   multivitamin with minerals  1 tablet Oral Daily   pantoprazole  20 mg Oral Daily   potassium chloride  20 mEq Oral BID   tiotropium  1 capsule Inhalation Daily   torsemide  20 mg Oral Daily   valACYclovir  500 mg Oral BID   zinc sulfate  220 mg Oral Daily   Continuous Infusions:  sodium chloride Stopped (03/28/21 2319)   thiamine injection Stopped (03/30/21 0523)    LOS: 3 days   Time spent: 54min  Baljit Liebert C Esha Fincher, DO [facility]  If 7PM-7AM, please contact night-coverage [email or web address]  03/30/2021, 7:01 AM

## 2021-03-30 NOTE — Plan of Care (Signed)
Neurology plan of care  No significant changes per chart review today. B1 level still pending. Patient is on wernicke dose thiamine. Avoid deliriogenic medications. Neurology will re-examine patient tmrw AM. Please call if any questions in the meantime.  Su Monks, MD [facility] [phone]  If 7pm- 7am, please page neurology on call as listed in [place].

## 2021-03-30 NOTE — Progress Notes (Signed)
Occupational Therapy Treatment Patient Details Name: Matthew Miles. MRN: [id number] DOB: 1928-12-01 Today's Date: 03/30/2021   History of present illness Pt is a 87 y/o M admitted on 03/26/21 with c/c of acute onset of generalized weakness & malaise & inability to open the L eye. CT & brain MRI showed no acute abnormalities. 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 PMH: CHF, COPD, CAD, DM2, emphyseam, GERD, HTN, dyslipidemia, prostate CA   OT comments  Matthew Miles was seen for OT treatment on this date, overlapping with PT for safe mobility. Upon arrival to room pt seated EOB with PT at bedside. Pt requires MAX A x2 + RW sit<>stand x3, TOTAL A to safely return to sitting on last attempt. SBA + SETUP + MIN cues self-feeding/drinking at bed level - pt requires assist to locate objects in his environment 2/2 visual deficits. Pt instructed in BUE HEP for bed level exercises. Pt making good progress toward goals. Pt continues to benefit from skilled OT services to maximize return to PLOF and minimize risk of future falls, injury, caregiver burden, and readmission. Will continue to follow POC. Discharge recommendation remains appropriate.     Recommendations for follow up therapy are one component of a multi-disciplinary discharge planning process, led by the attending physician.  Recommendations may be updated based on patient status, additional functional criteria and insurance authorization.    Follow Up Recommendations  Skilled nursing-short term rehab (<3 hours/day)    Assistance Recommended at Discharge Frequent or constant Supervision/Assistance  Equipment Recommendations  Other (comment) (defer to next venue of care)    Recommendations for Other Services      Precautions / Restrictions Precautions Precautions: Fall Restrictions Weight Bearing Restrictions: No       Mobility Bed Mobility Overal bed mobility: Needs Assistance Bed  Mobility: Sit to Supine     Sit to supine: Max assist   General bed mobility comments: verbal cues for sequencing and technique. increased time and effort required to complete tasks    Transfers Overall transfer level: Needs assistance Equipment used: Rolling walker (2 wheels) Transfers: Sit to/from Stand Sit to Stand: Max assist;+2 physical assistance           General transfer comment: pt unable to state when legs will buckle, requires TOTAL A to safely assist pt to sitting     Balance Overall balance assessment: Needs assistance Sitting-balance support: Feet supported;Bilateral upper extremity supported Sitting balance-Leahy Scale: Fair     Standing balance support: Bilateral upper extremity supported;Reliant on assistive device for balance Standing balance-Leahy Scale: Zero Standing balance comment: +2 person assistance required to maintain standing balance. bilateral knees buckling with weight bearing                           ADL either performed or assessed with clinical judgement   ADL Overall ADL's : Needs assistance/impaired                                       General ADL Comments: MAX A x2 + RW for ADL t/f. SBA + SETUP + MIN cues self-feeding/drinking at bed level - pt requires assist to locate objects in his environment 2/2 visual deficits.      Cognition Arousal/Alertness: Awake/alert Behavior During Therapy: Impulsive Overall Cognitive Status: Impaired/Different from baseline  Following Commands: Follows one step commands inconsistently Safety/Judgement: Decreased awareness of safety;Decreased awareness of deficits                Exercises Exercises: General Upper Extremity General Exercises - Upper Extremity Shoulder Flexion: AROM;Strengthening;Both;10 reps;Supine Shoulder Extension: AROM;Strengthening;Both;10 reps;Supine Elbow Flexion: AROM;Strengthening;Both;10 reps;Supine Elbow  Extension: AROM;Strengthening;Both;10 reps;Supine Digit Composite Flexion: AROM;Strengthening;Both;10 reps;Supine Composite Extension: AROM;Strengthening;Both;10 reps;Supine Other Exercises Other Exercises: verbal cues for technique for strengthening   Shoulder Instructions       General Comments Sp02 98% on 2 L02 and heart rate 65 bpm while seated.    Pertinent Vitals/ Pain       Pain Assessment: No/denies pain         Frequency  Min 2X/week        Progress Toward Goals  OT Goals(current goals can now be found in the care plan section)  Progress towards OT goals: Progressing toward goals  Acute Rehab OT Goals Patient Stated Goal: to walk OT Goal Formulation: With patient Time For Goal Achievement: 04/10/21 Potential to Achieve Goals: Fair ADL Goals Pt Will Perform Grooming: with min guard assist;sitting Pt Will Perform Lower Body Dressing: with mod assist;sit to/from stand Pt Will Transfer to Toilet: with min assist;stand pivot transfer;bedside commode  Plan Discharge plan remains appropriate;Frequency remains appropriate    Co-evaluation      Reason for Co-Treatment: To address functional/ADL transfers PT goals addressed during session: Mobility/safety with mobility OT goals addressed during session: ADL's and self-care      AM-PAC OT "6 Clicks" Daily Activity     Outcome Measure   Help from another person eating meals?: A Little Help from another person taking care of personal grooming?: A Lot Help from another person toileting, which includes using toliet, bedpan, or urinal?: A Lot Help from another person bathing (including washing, rinsing, drying)?: A Lot Help from another person to put on and taking off regular upper body clothing?: A Little Help from another person to put on and taking off regular lower body clothing?: A Lot 6 Click Score: 14    End of Session Equipment Utilized During Treatment: Gait belt;Rolling walker (2 wheels)  OT Visit  Diagnosis: Other abnormalities of gait and mobility (R26.89)   Activity Tolerance Patient tolerated treatment well   Patient Left in bed;with call bell/phone within reach;with bed alarm set   Nurse Communication          Time: 0932-3557 OT Time Calculation (min): 14 min  Charges: OT General Charges $OT Visit: 1 Visit OT Treatments $Self Care/Home Management : 8-22 mins  Dessie Coma, M.S. OTR/L  03/30/21, 3:34 PM  ascom [phone]

## 2021-03-30 NOTE — TOC Progression Note (Signed)
Transition of Care (TOC) - Progression Note    Patient Details  Name: Matthew Miles. MRN: [id number] Date of Birth: 25-Nov-1928  Transition of Care [facility]) CM/SW Contact  Pete Pelt, RN Phone Number: 03/30/2021, 2:30 PM  Clinical Narrative:  [facility]  [facility], they [facility] take [facility] to the facility. As per Care team, patient is not medically ready for discharge, will be a few more days.  Will need to get auth for SNF once patient is near discharge.    Expected Discharge Plan: [place] Barriers to Discharge: Continued Medical Work up  Expected Discharge Plan and Services Expected Discharge Plan: [place] In-house Referral: Clinical Social Work   Post Acute Care Choice: [place] Living arrangements for the past 2 months: [place]                                       Social Determinants of Health (SDOH) Interventions    Readmission Risk Interventions No flowsheet data found.

## 2021-03-31 LAB — BASIC METABOLIC PANEL
Anion gap: 5 (ref 5–15)
BUN: 33 mg/dL — ABNORMAL HIGH (ref 8–23)
CO2: 28 mmol/L (ref 22–32)
Calcium: 8.2 mg/dL — ABNORMAL LOW (ref 8.9–10.3)
Chloride: 103 mmol/L (ref 98–111)
Creatinine, Ser: 1.18 mg/dL (ref 0.61–1.24)
GFR, Estimated: 59 mL/min — ABNORMAL LOW (ref >60)
Glucose, Bld: 287 mg/dL — ABNORMAL HIGH (ref 70–99)
Potassium: 4 mmol/L (ref 3.5–5.1)
Sodium: 136 mmol/L (ref 135–145)

## 2021-03-31 LAB — CBC
HCT: 28.9 % — ABNORMAL LOW (ref 39.0–52.0)
Hemoglobin: 9.2 g/dL — ABNORMAL LOW (ref 13.0–17.0)
MCH: 31.1 pg (ref 26.0–34.0)
MCHC: 31.8 g/dL (ref 30.0–36.0)
MCV: 97.6 fL (ref 80.0–100.0)
Platelets: 126 10*3/uL — ABNORMAL LOW (ref 150–400)
RBC: 2.96 MIL/uL — ABNORMAL LOW (ref 4.22–5.81)
RDW: 15.1 % (ref 11.5–15.5)
WBC: 10 10*3/uL (ref 4.0–10.5)
nRBC: 0 % (ref 0.0–0.2)

## 2021-03-31 LAB — CULTURE, BLOOD (ROUTINE X 2)
Culture: NO GROWTH
Culture: NO GROWTH
Special Requests: ADEQUATE
Special Requests: ADEQUATE

## 2021-03-31 LAB — ACETYLCHOLINE RECEPTOR AB, ALL
Acety choline binding ab: <0.03 nmol/L (ref 0.00–0.24)
Acetylchol Block Ab: 24 % (ref 0–25)

## 2021-03-31 LAB — GLUCOSE, CAPILLARY: Glucose-Capillary: 325 mg/dL — ABNORMAL HIGH (ref 70–99)

## 2021-03-31 MED ORDER — INSULIN ASPART 100 UNIT/ML IJ SOLN
2.0000 [IU] | Freq: Three times a day (TID) | INTRAMUSCULAR | Status: DC
  Administered 2021-04-01 – 2021-04-02 (×5): 2 [IU] via SUBCUTANEOUS
  Filled 2021-03-31 (×5): qty 1

## 2021-03-31 MED ORDER — INSULIN ASPART 100 UNIT/ML IJ SOLN
0.0000 [IU] | Freq: Three times a day (TID) | INTRAMUSCULAR | Status: DC
Start: 2021-04-01 — End: 2021-04-04
  Administered 2021-04-01: 13:00:00 4 [IU] via SUBCUTANEOUS
  Administered 2021-04-01: 09:00:00 2 [IU] via SUBCUTANEOUS
  Administered 2021-04-02 – 2021-04-04 (×2): 1 [IU] via SUBCUTANEOUS
  Filled 2021-03-31 (×4): qty 1

## 2021-03-31 NOTE — Progress Notes (Signed)
Speech Language Pathology Treatment: Dysphagia  Patient Details Name: Matthew Miles. MRN: [id number] DOB: [birth year] Today's Date: 03/31/2021 Time: [id number] SLP Time Calculation (min) (ACUTE ONLY): 50 min  Assessment / Plan / Recommendation Clinical Impression  Pt seen for ongoing assessment of swallowing; toleration of diet. He is currently on a Dys. Level 2 Minced foods, w/ thin liquids. His Dentures are ill-fitting so this Clinician instructed Family and pt to leave the out of his mouth d/t concern for creating ulcerations in the mouth as well as choking hazard. He is awake, verbally responsive and engaged in responses w/ SLP; min HOH. Noted pt maintained Closed Eyes often during session -- unsure of pt's Baseline Cognitive functioning(see Head Imaging); also, pt's Family stated he lived alone. Pt does have poor vision per Family. Pt is on [place] O2 support; wbc wnl. Family present during session. They stated he is having trouble w/ the Minced foods/diet -- does not seem to want to "try it", but will eat purees.   Pt explained general aspiration precautions and agreed verbally to the need for following them especially sitting upright for all oral intake -- supported pt fully in sitting upright in the bed. Son given education on Full Upright sitting. Pt was also weak and could not help much to sit himself upright in the bed; full support given.    He assisted in feeding himself by holding cup to drink thin liquids via cup/straw but required FULL feeding support for puree and thin liquid trials. No immediate, overt clinical s/s of aspiration were noted w/ any consistency; respiratory status remained calm and unlabored, vocal quality clear b/t trials. Pt exhibited a mild, delayed throat clearing b/t trials of puree x2, but no further overt deficits noted. Discussed w/ Son and Dtr who arrived the risk for aspiration of thin liquids at advanced age w/ illness as well as drinking in bed; discussed the  importance of swallowing Pills in a Puree -- the Puree providing cohesion for swallowing tablets. Oral phase appeared grossly [facility] for bolus management and timely A-P transfer for swallowing of the purees, thin liquids. No trials of the minced/moist trials(solid) given d/t pt NOT wanting them and d/t Edentulous status. Oral clearing achieved w/ the puree and thin liquid consistencies. Adequate bolus management of the thin liquids noted but cues given for SINGLE sips to reduce risk of aspiration.     Pt appears at Reduced risk for aspiration when following aspiration precautions and using a PUREE diet. Recommend dys. Level 1(PUREE foods) diet for ease of oral phase w/ gravies added to moisten foods; Thin liquids -- monitor all straw drinking and remove straws IF increased coughing noted. Recommend aspiration precautions; Pills Whole vs Crushed in Puree; tray setup and positioning assistance for meals. Support w/ feeding at meals as needed. REFLUX precautions d/t pt's baseline. ST services will monitor toleration of diet x1. NSG updated. Precautions posted at bedside.     HPI HPI: Per 96 H&P "Matthew Miles. is a 87 y.o. Caucasian male with medical history significant for CHF, COPD, coronary artery disease, type 2 diabetes mellitus, emphysema and GERD, hypertension and dyslipidemia, who presented to the ER with acute onset of generalized weakness and malaise and inability to open the left eye.  He was noted to be unable to move his left eye immediately.  No blurred vision or diplopia.  No paresthesias or focal muscle weakness.  No headache or dizziness.  No vertigo or tinnitus.  He has been having visual hallucinations  and has been trying to hold to things in the ER.  No dysuria, oliguria or hematuria or flank pain.     ED Course: When he came to the ER vital signs were within normal.  Labs revealed hyponatremia 127 with hypochloremia of 93.  Blood glucose was 184 and BUN 25.  Alk phos was significantly  elevated at 517 with albumin of 2.8, AST of 76 and ALT of 116 with protein of 6.1 and lactic acid was 1.4.  CBC showed WBC of 10.7 and anemia close to baseline.     Imaging: Noncontrast head CT scan revealed chronic atrophic and ischemic changes without acute abnormality.  Brain MRI without contrast showed no acute abnormalities.  He had an MRA of the head and neck which revealed the following:  1. The right vertebral artery is not well visualized for the  entirety of its course, both extra cranially and intracranially,  likely occluded although slow flow or severe stenosis can appear  similar. Consider CTA head and neck for further evaluation.  2. Fusiform dilatation of the right cavernous ICA.  3. 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  4. Focal stenosis in the left P1 and left A1. Narrowing is also  noted at the origin of the right P3 branch.  5. The origin of the left vertebral artery and left common carotid  artery are not well visualized, secondary to artifact.".  CXR: "Interval development of small bilateral pleural effusions with  associated left basilar atelectasis.".      SLP Plan  Continue with current plan of care      Recommendations for follow up therapy are one component of a multi-disciplinary discharge planning process, led by the attending physician.  Recommendations may be updated based on patient status, additional functional criteria and insurance authorization.    Recommendations  Diet recommendations: Dysphagia 1 (puree);Thin liquid Liquids provided via: Cup;Straw (monitor) Medication Administration: Whole meds with puree Supervision: Staff to assist with self feeding;Full supervision/cueing for compensatory strategies Compensations: Minimize environmental distractions;Slow rate;Small sips/bites;Lingual sweep for clearance of pocketing;Multiple dry swallows after each bite/sip;Follow solids with liquid Postural Changes and/or Swallow  Maneuvers: Out of bed for meals;Seated upright 90 degrees;Upright 30-60 min after meal                General recommendations:  (Palliative Care consult; dietician f/u) Oral Care Recommendations: Oral care BID;Oral care before and after PO;Staff/trained caregiver to provide oral care Follow Up Recommendations: Skilled nursing-short term rehab (<3 hours/day) Assistance recommended at discharge: Frequent or constant Supervision/Assistance SLP Visit Diagnosis: Dysphagia, oropharyngeal phase (R13.12) (Cognitive status??) Plan: Continue with current plan of care            Orinda Kenner, [place], CCC-SLP Speech Language Pathologist Rehab Services [phone] [facility]  03/31/2021, 3:20 PM

## 2021-03-31 NOTE — Progress Notes (Signed)
PROGRESS NOTE   Matthew Miles.  LGX:[id number] DOB: 08-27-1928 DOA: 03/26/2021 PCP: [name], MD  Brief Narrative:   87 year old white male known history of  prostate cancer hormone sensitive foll Dr Karolee Stamps on intermittent androgen deprivation therapy since 10/2017--radiation early 2000 Splenic marginal zone lymphoma--lymphoma 2020 status post Rituxan X 4 weeks maintenance Rituxan Gastrostomy 2013 for bleeding ulcer hemorrhoidectomy in the 60s COPD CAD with CHF  Recent hospitalization [facility] 12 8--03/25/2021--diagnosed with bladder spasm overflow incontinence and urinary tract infection treated with Augmentin as well as antifungals Myrbetriq stopped patient sent home on low-dose trazodone and Oxley  [facility] ED 2-day history of left eyelid ptosis plus generalized malaise worsening left eye vision in addition to hallucinations Seen by neurology and ophthalmology-ophthalmology follow-up for left internuclear ophthalmoplegia  Hospital-Problem based course  Toxic metabolic encephalopathy with hospital-acquired delirium hallucinations?  Warnicke's versus polypharmacy Seems slightly improved  completed thiamine 500 3 times daily as per neurology Continue MVI Follow-up thiamine level--- myasthenia gravis panel is negative Left internuclear ophthalmoplegia Possibly secondary to vertebrobasilar insufficiency on MRI Defer to neurology B-cell lymphoma diagnosed 2020 Prostate cancer Outpatient follow-up at [facility] and continue Tixagevimab once follows up as an outpatient NIDDM type II CBGs 236-287 eating minimally Home metformin on hold Sliding scale coverage sensitive BPH Continue Foley catheter which was placed at outside hospital and will need outpatient urology follow-up Continue alfuzosin 10 daily and finasteride 5 daily AKI Torsemide from prior to admission on hold Continue saline 100 cc/H and force oral fluids Decubitus ulcer prior to  admission As per wound nurse  DVT prophylaxis: Lovenox Code Status: Full Family Communication: Discussed with daughter at the bedside Disposition:  Status is: Inpatient  Remains inpatient appropriate because: Not ready for discharge still somewhat encephalopathic       Consultants:  Procedures:   Antimicrobials:     Subjective: More coherent seems to be in no distress can tell me is at the hospital but otherwise cannot really relay place person Daughter in room and long discussion with update  Objective: Vitals:   03/31/21 0056 03/31/21 0501 03/31/21 0809 03/31/21 1119  BP:  (!) 115/48 (!) 141/51 (!) 115/54  Pulse: 62 62 80 66  Resp:  20 17 20   Temp:  (!) 97.4 F (36.3 C) 98.7 F (37.1 C) 98.1 F (36.7 C)  TempSrc:  Oral Oral   SpO2: 95% 94% 98% 96%  Weight:      Height:        Intake/Output Summary (Last 24 hours) at 03/31/2021 1523 Last data filed at 03/31/2021 1417 Gross per 24 hour  Intake 360 ml  Output 750 ml  Net -390 ml   Filed Weights   03/26/21 1645  Weight: 76.7 kg    Examination: Ptosis bilaterally-pupils reactive to light Cannot assess ocular movements secondary to poor cooperation Neck soft supple Mallampati 4 On oxygen Chest clear no added sound no rales no rhonchi ROM intact S1-S2 no murmur no rub no gallop Neurologically intact   Data Reviewed: personally reviewed   CBC    Component Value Date/Time   WBC 10.0 03/31/2021 0519   RBC 2.96 (L) 03/31/2021 0519   HGB 9.2 (L) 03/31/2021 0519   HGB 11.0 (L) 11/13/2017 1425   HCT 28.9 (L) 03/31/2021 0519   HCT 35.4 (L) 11/13/2017 1425   PLT 126 (L) 03/31/2021 0519   PLT 153 08/27/2012 1145   MCV 97.6 03/31/2021 0519   MCV 85 08/27/2012 1145  MCH 31.1 03/31/2021 0519   MCHC 31.8 03/31/2021 0519   RDW 15.1 03/31/2021 0519   RDW 16.5 (H) 08/27/2012 1145   LYMPHSABS 0.8 03/26/2021 1641   LYMPHSABS 1.4 08/27/2012 1145   MONOABS 1.0 03/26/2021 1641   MONOABS 0.9 08/27/2012 1145    EOSABS 0.1 03/26/2021 1641   EOSABS 0.0 08/27/2012 1145   BASOSABS 0.0 03/26/2021 1641   BASOSABS 0.0 08/27/2012 1145   CMP Latest Ref Rng & Units 03/31/2021 03/30/2021 03/29/2021  Glucose 70 - 99 mg/dL 287(H) 236(H) 202(H)  BUN 8 - 23 mg/dL 33(H) 29(H) 24(H)  Creatinine 0.61 - 1.24 mg/dL 1.18 1.33(H) 1.12  Sodium 135 - 145 mmol/L 136 135 134(L)  Potassium 3.5 - 5.1 mmol/L 4.0 3.7 3.0(L)  Chloride 98 - 111 mmol/L 103 101 98  CO2 22 - 32 mmol/L 28 28 29   Calcium 8.9 - 10.3 mg/dL 8.2(L) 8.1(L) 7.9(L)  Total Protein 6.5 - 8.1 g/dL - - -  Total Bilirubin 0.3 - 1.2 mg/dL - - -  Alkaline Phos 38 - 126 U/L - - -  AST 15 - 41 U/L - - -  ALT 0 - 44 U/L - - -     Radiology Studies: No results found.   Scheduled Meds:  (feeding supplement) PROSource Plus  30 mL Oral TID BM   alfuzosin  10 mg Oral Q breakfast   ascorbic acid  500 mg Oral BID   aspirin EC  81 mg Oral Daily   atorvastatin  40 mg Oral QHS   Chlorhexidine Gluconate Cloth  6 each Topical Daily   dextromethorphan-guaiFENesin  1 tablet Oral BID   enoxaparin (LOVENOX) injection  40 mg Subcutaneous Q24H   feeding supplement  237 mL Oral BID BM   ferrous sulfate  325 mg Oral Daily   finasteride  5 mg Oral Daily   fluconazole  100 mg Oral Daily   fluticasone  2 spray Each Nare Daily   lidocaine  1 patch Transdermal Q24H   mouth rinse  15 mL Mouth Rinse BID   melatonin  2.5 mg Oral QHS   multivitamin with minerals  1 tablet Oral Daily   pantoprazole  20 mg Oral Daily   tiotropium  1 capsule Inhalation Daily   valACYclovir  500 mg Oral BID   zinc sulfate  220 mg Oral Daily   Continuous Infusions:  sodium chloride Stopped (03/28/21 2319)     LOS: 4 days   Time spent: [place], MD [facility] To contact the attending provider between 7A-7P or the covering provider during after hours 7P-7A, please log into the web site [email or web address] and access using universal [place] password for that web  site. If you do not have the password, please call the hospital operator.  03/31/2021, 3:23 PM

## 2021-03-31 NOTE — Plan of Care (Signed)
Neurology plan of care  Per chart review, mental status markedly improving, no longer hallucinating, sleeping well. Opthalmoplegia improved after starting high dose thiamine. Recovery is anticipated to be slow. No additional neurologic workup indicated at this time.  Recommendations: - Continue wernicke dose thiamine regimen (ordered) - Primary team please f/u pending thiamine level and myasthenia gravis panel  Neurology will not continue to actively follow, but please re-engage if additional neurologic concerns arise.  Su Monks, MD [facility] [phone]  If 7pm- 7am, please page neurology on call as listed in [place].

## 2021-04-01 ENCOUNTER — Inpatient Hospital Stay: Payer: Medicare HMO

## 2021-04-01 LAB — BASIC METABOLIC PANEL
Anion gap: 5 (ref 5–15)
BUN: 27 mg/dL — ABNORMAL HIGH (ref 8–23)
CO2: 28 mmol/L (ref 22–32)
Calcium: 8.3 mg/dL — ABNORMAL LOW (ref 8.9–10.3)
Chloride: 102 mmol/L (ref 98–111)
Creatinine, Ser: 0.92 mg/dL (ref 0.61–1.24)
GFR, Estimated: >60 mL/min (ref >60)
Glucose, Bld: 280 mg/dL — ABNORMAL HIGH (ref 70–99)
Potassium: 4.1 mmol/L (ref 3.5–5.1)
Sodium: 135 mmol/L (ref 135–145)

## 2021-04-01 LAB — CBC
HCT: 29.7 % — ABNORMAL LOW (ref 39.0–52.0)
Hemoglobin: 9.4 g/dL — ABNORMAL LOW (ref 13.0–17.0)
MCH: 30.7 pg (ref 26.0–34.0)
MCHC: 31.6 g/dL (ref 30.0–36.0)
MCV: 97.1 fL (ref 80.0–100.0)
Platelets: 148 10*3/uL — ABNORMAL LOW (ref 150–400)
RBC: 3.06 MIL/uL — ABNORMAL LOW (ref 4.22–5.81)
RDW: 14.9 % (ref 11.5–15.5)
WBC: 12.1 10*3/uL — ABNORMAL HIGH (ref 4.0–10.5)
nRBC: 0 % (ref 0.0–0.2)

## 2021-04-01 LAB — GLUCOSE, CAPILLARY
Glucose-Capillary: 238 mg/dL — ABNORMAL HIGH (ref 70–99)
Glucose-Capillary: 301 mg/dL — ABNORMAL HIGH (ref 70–99)
Glucose-Capillary: 117 mg/dL — ABNORMAL HIGH (ref 70–99)
Glucose-Capillary: 112 mg/dL — ABNORMAL HIGH (ref 70–99)

## 2021-04-01 LAB — LACTIC ACID, PLASMA: Lactic Acid, Venous: 1.5 mmol/L (ref 0.5–1.9)

## 2021-04-01 LAB — BRAIN NATRIURETIC PEPTIDE: B Natriuretic Peptide: 2313 pg/mL — ABNORMAL HIGH (ref 0.0–100.0)

## 2021-04-01 IMAGING — DX DG CHEST 1V PORT
1 series · 1 of 1 positions shown · non-contrast
Comparison: Radiographs [DATE] and [DATE].  CT [DATE].

CLINICAL DATA: Pneumonia.

EXAM:
PORTABLE CHEST 1 VIEW

[chest ap]
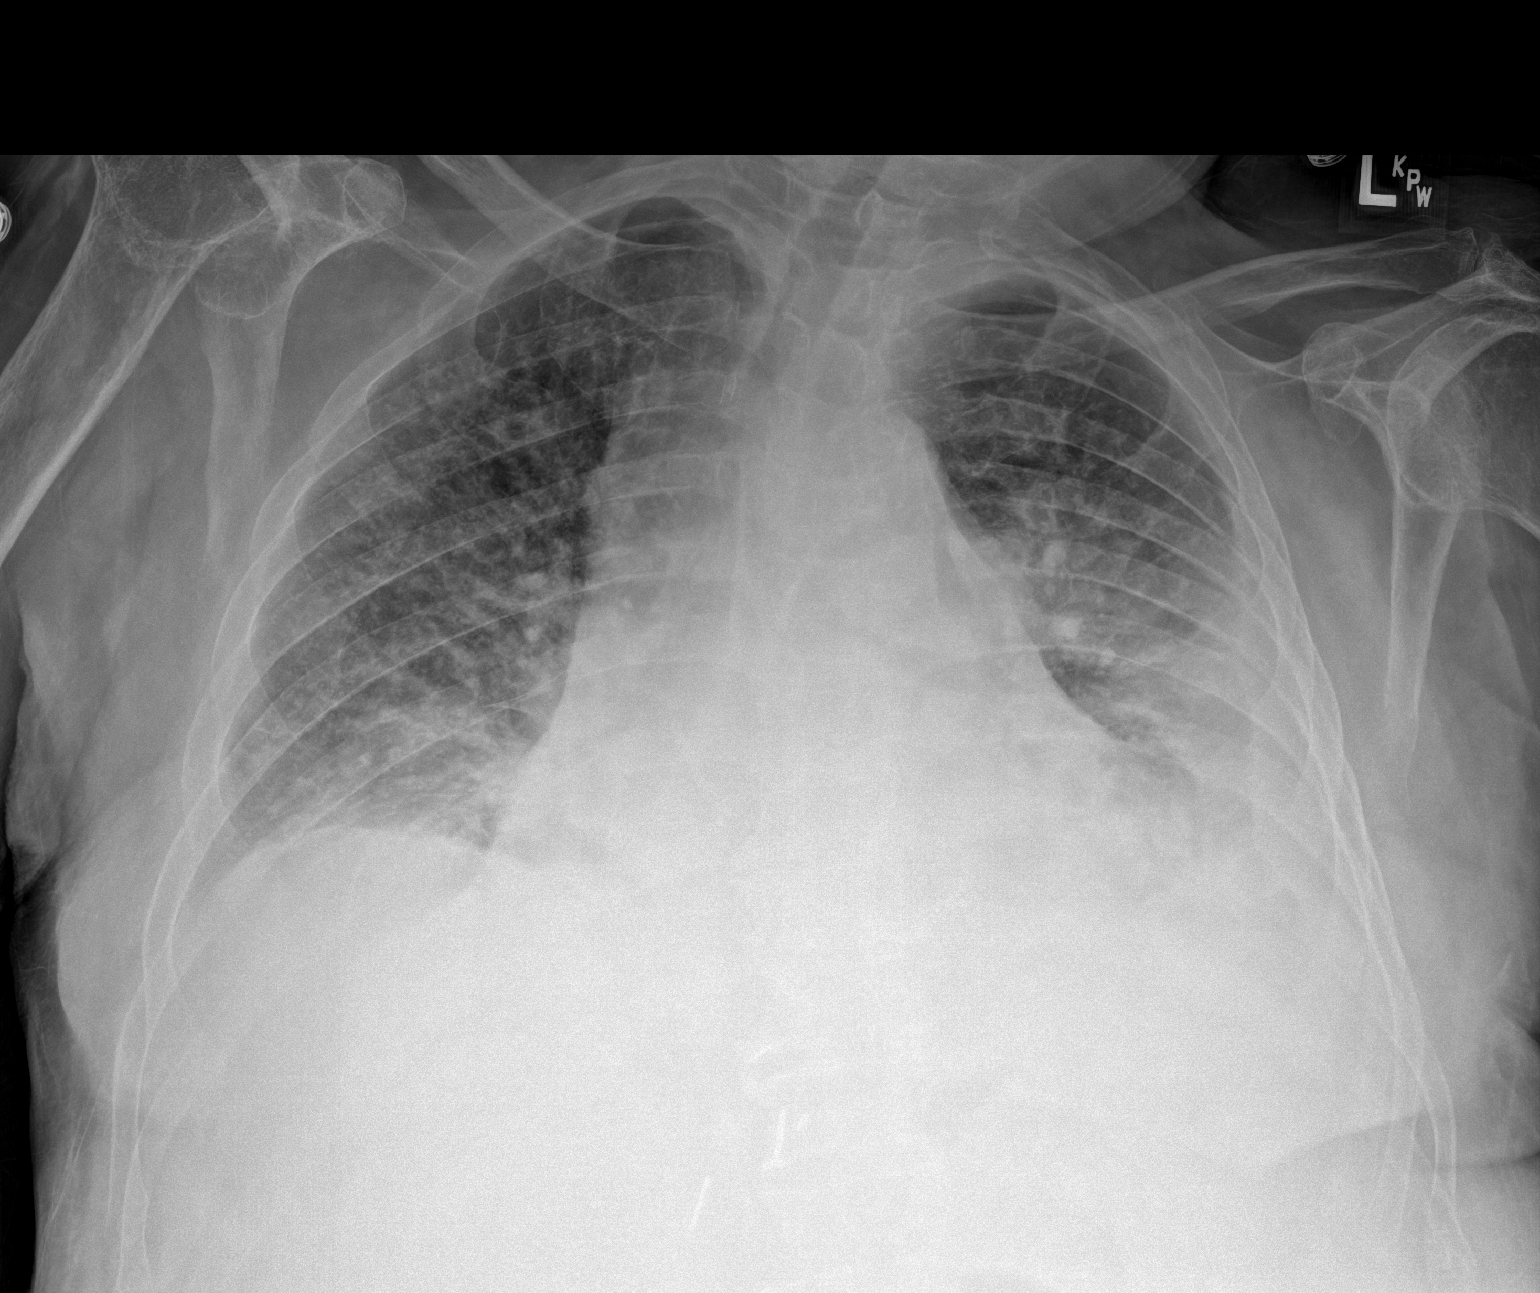

[1 of 1 positions shown; findings below may reference images not displayed]

FINDINGS: [SC] hours. Lower lung volumes. The heart size is stable at the
upper limits of normal. There are enlarging left greater than right
pleural effusions with associated bibasilar pulmonary opacities and
increasingly poor definition of the pulmonary vasculature. Findings
may reflect pulmonary edema, although pneumonia at the left lung
base cannot be excluded. No evidence of pneumothorax. No acute
osseous findings are seen. Evidence of a chronic rotator cuff tear
on the right.
IMPRESSION: Worsening bilateral pleural effusions and bilateral airspace
opacities which may reflect congestive heart failure or pneumonia.
Radiographic follow up recommended.

## 2021-04-01 MED ORDER — INSULIN DETEMIR 100 UNIT/ML ~~LOC~~ SOLN
5.0000 [IU] | Freq: Every day | SUBCUTANEOUS | Status: DC
Start: 2021-04-01 — End: 2021-04-04
  Administered 2021-04-01 – 2021-04-04 (×4): 5 [IU] via SUBCUTANEOUS
  Filled 2021-04-01 (×4): qty 0.05

## 2021-04-01 MED ORDER — FUROSEMIDE 10 MG/ML IJ SOLN
40.0000 mg | Freq: Two times a day (BID) | INTRAMUSCULAR | Status: DC
  Administered 2021-04-01 – 2021-04-05 (×7): 40 mg via INTRAVENOUS
  Filled 2021-04-01 (×7): qty 4

## 2021-04-01 MED ORDER — FLUCONAZOLE 100 MG PO TABS
200.0000 mg | ORAL_TABLET | Freq: Every day | ORAL | Status: DC
  Filled 2021-04-01: qty 2

## 2021-04-01 NOTE — Progress Notes (Signed)
PROGRESS NOTE   Matthew Miles.  AOZ:[id number] DOB: 1928/05/13 DOA: 03/26/2021 PCP: [name], MD  Brief Narrative:   87 year old white male known history of  prostate cancer hormone sensitive foll Dr Karolee Stamps on intermittent androgen deprivation therapy since 10/2017--radiation early 2000 Splenic marginal zone lymphoma--lymphoma 2020 status post Rituxan X 4 weeks maintenance Rituxan Gastrostomy 2013 for bleeding ulcer hemorrhoidectomy in the 60s COPD CAD with CHF  Recent hospitalization [facility] 12 8--03/25/2021--diagnosed with bladder spasm overflow incontinence and urinary tract infection treated with Augmentin as well as antifungals Myrbetriq stopped patient sent home on low-dose trazodone and Oxley  [facility] ED 2-day history of left eyelid ptosis plus generalized malaise worsening left eye vision in addition to hallucinations Seen by neurology and ophthalmology-ophthalmology follow-up for left internuclear ophthalmoplegia  Hospital-Problem based course   At risk for aspiration pneumonia Probable volume overload Patient more tachypneic using accessory muscles and requiring higher levels of oxygen, previously on 2 L now on 6 L oxygen CXR my over read shows capitalization of the right heart border and diffuse infiltrates bilaterally-concerning more for volume overload than pneumonia We will start Unasyn IV secondary to white count elevated at 12, will get a BNP and lactic acid to differentiate  start Lasix 40 IV twice daily first dose now Toxic metabolic encephalopathy with hospital-acquired delirium hallucinations?  Wernicke's versus polypharmacy Seems slightly improved  completed thiamine 500 3 times daily as per neurology Continue MVI Follow-up thiamine level--- myasthenia gravis panel is negative Left internuclear ophthalmoplegia Possibly secondary to vertebrobasilar insufficiency on MRI Defer to neurology B-cell lymphoma diagnosed  2020 Prostate cancer Outpatient follow[facility] at [facility] and continue Tixagevimab once follows up as an outpatient NIDDM type II CBGs 238-301 eating minimally--continue sliding scale along with 2 units 3 times a day meals, add Levemir 5 Home metformin on hold BPH Continue Foley catheter which was placed at outside hospital and will need outpatient urology follow-up Continue alfuzosin 10 daily and finasteride 5 daily AKI Torsemide from prior to admission on hold Saline lock IV Decubitus ulcer prior to admission As per wound nurse  DVT prophylaxis: Lovenox Code Status: Status change after discussion with family to DNR on 12/22 Family Communication: Discussed with daughter/son at the bedside--have explained patient could have aspirated and that this could be infectious versus this being volume overload-family does not wish aggressive measures including intubation but will discuss if BiPAP becomes an option  Disposition:  Status is: Inpatient  Remains inpatient appropriate because: Not ready for discharge still somewhat encephalopathic   Consultants: no  Procedures:   Antimicrobials:   unasayn  Subjective: Increased work of breathing since morning gradually worsened to now needing 6 L No chest pain-is talking in full sentences and making some sense Eyes closed and cannot see me He is somewhat tangential  Objective: Vitals:   04/01/21 0820 04/01/21 1126 04/01/21 1220 04/01/21 1305  BP: 135/85 (!) 149/67    Pulse: (!) 106 100    Resp: 20 20    Temp: 98.5 F (36.9 C) 98.1 F (36.7 C)    TempSrc: Oral Oral    SpO2: 94% (!) 85% 96% 98%  Weight:      Height:        Intake/Output Summary (Last 24 hours) at 04/01/2021 1312 Last data filed at 04/01/2021 0654 Gross per 24 hour  Intake 1167.38 ml  Output 950 ml  Net 217.38 ml    Filed Weights   03/26/21 1645  Weight: 76.7 kg  Examination:  Ptosis bilaterally Cannot assess ocular movements secondary to poor  cooperation Neck soft supple Mallampati 4 On oxygen Chest clear decreased air entry posterolaterally right side greater than sign left with some crackles ROM intact S1-S2 no murmur no rub no gallop Neurologically intact   Data Reviewed: personally reviewed   CBC    Component Value Date/Time   WBC 12.1 (H) 04/01/2021 0445   RBC 3.06 (L) 04/01/2021 0445   HGB 9.4 (L) 04/01/2021 0445   HGB 11.0 (L) 11/13/2017 1425   HCT 29.7 (L) 04/01/2021 0445   HCT 35.4 (L) 11/13/2017 1425   PLT 148 (L) 04/01/2021 0445   PLT 153 08/27/2012 1145   MCV 97.1 04/01/2021 0445   MCV 85 08/27/2012 1145   MCH 30.7 04/01/2021 0445   MCHC 31.6 04/01/2021 0445   RDW 14.9 04/01/2021 0445   RDW 16.5 (H) 08/27/2012 1145   LYMPHSABS 0.8 03/26/2021 1641   LYMPHSABS 1.4 08/27/2012 1145   MONOABS 1.0 03/26/2021 1641   MONOABS 0.9 08/27/2012 1145   EOSABS 0.1 03/26/2021 1641   EOSABS 0.0 08/27/2012 1145   BASOSABS 0.0 03/26/2021 1641   BASOSABS 0.0 08/27/2012 1145   CMP Latest Ref Rng & Units 04/01/2021 03/31/2021 03/30/2021  Glucose 70 - 99 mg/dL 280(H) 287(H) 236(H)  BUN 8 - 23 mg/dL 27(H) 33(H) 29(H)  Creatinine 0.61 - 1.24 mg/dL 0.92 1.18 1.33(H)  Sodium 135 - 145 mmol/L 135 136 135  Potassium 3.5 - 5.1 mmol/L 4.1 4.0 3.7  Chloride 98 - 111 mmol/L 102 103 101  CO2 22 - 32 mmol/L 28 28 28   Calcium 8.9 - 10.3 mg/dL 8.3(L) 8.2(L) 8.1(L)  Total Protein 6.5 - 8.1 g/dL - - -  Total Bilirubin 0.3 - 1.2 mg/dL - - -  Alkaline Phos 38 - 126 U/L - - -  AST 15 - 41 U/L - - -  ALT 0 - 44 U/L - - -     Radiology Studies: No results found.   Scheduled Meds:  alfuzosin  10 mg Oral Q breakfast   aspirin EC  81 mg Oral Daily   Chlorhexidine Gluconate Cloth  6 each Topical Daily   dextromethorphan-guaiFENesin  1 tablet Oral BID   enoxaparin (LOVENOX) injection  40 mg Subcutaneous Q24H   feeding supplement  237 mL Oral BID BM   finasteride  5 mg Oral Daily   fluconazole  100 mg Oral Daily   fluticasone   2 spray Each Nare Daily   furosemide  40 mg Intravenous BID   insulin aspart  0-6 Units Subcutaneous TID WC   insulin aspart  2 Units Subcutaneous TID WC   insulin detemir  5 Units Subcutaneous Daily   lidocaine  1 patch Transdermal Q24H   mouth rinse  15 mL Mouth Rinse BID   pantoprazole  20 mg Oral Daily   tiotropium  1 capsule Inhalation Daily   valACYclovir  500 mg Oral BID   Continuous Infusions:     LOS: 5 days   Time spent: 70  Nita Sells, MD [facility] To contact the attending provider between 7A-7P or the covering provider during after hours 7P-7A, please log into the web site [email or web address] and access using universal [place] password for that web site. If you do not have the password, please call the hospital operator.  04/01/2021, 1:12 PM

## 2021-04-01 NOTE — Progress Notes (Signed)
Speech Language Pathology Treatment:    °Patient Details °Name: Matthew Sircy Jr. °MRN: [id number] °DOB: 04/23/1928 °Today's Date: 04/01/2021 °Time: 0905-0930 °SLP Time Calculation (min) (ACUTE ONLY): 25 min ° °Assessment / Plan / Recommendation °Clinical Impression ° Pt seen for diet tolerance. Pt alert with limited cooperative. Notable confusion (e.g. reaching for things not in room, tangential speech). Daughter present for duration of tx. Baseline congested cough noted prior to POs. Pt coughing up thick clear and white secretions prior to POs and using [place] with assistance to clear from oral cavity. ° °Pt benefited from physical assistance for repositioning. Pt with limited acceptance of POs despite encouragement from SLP and daughter.  With max encouragement, pt accepted x4 teaspoons of puree and x3 single straw sips of water. While oral phase appeared functional and there were no overt s/sx pharyngeal dysphagia, recommend SLP f/u x1 for diet tolerance given limited nature of today's observation.  ° °Per chart review, Pt on 4L/min O2 via [place]. WBC slightly elevated. Temp WNL.  ° °Reviewed and explained SLP rationale for current diet recommendations, safe swallowing strategies/aspiration precautions, reflux precautions, and SLP POC with pt's daughter. Daughter verbalized understanding/agreement. Pt present for education; however, suspect reduced comprehension given confusion.  ° °SLP to f/u x1 for diet tolerance. Nursing updated. °  °HPI HPI: Per Physician's H&P "Matthew Hoehn Jr. is a 87 y.o. Caucasian male with medical history significant for CHF, COPD, coronary artery disease, type 2 diabetes mellitus, emphysema and GERD, hypertension and dyslipidemia, who presented to the ER with acute onset of generalized weakness and malaise and inability to open the left eye.  He was noted to be unable to move his left eye immediately.  No blurred vision or diplopia.  No paresthesias or focal muscle weakness.  No headache  or dizziness.  No vertigo or tinnitus.  He has been having visual hallucinations and has been trying to hold to things in the ER.  No dysuria, oliguria or hematuria or flank pain.     ED Course: When he came to the ER vital signs were within normal.  Labs revealed hyponatremia 127 with hypochloremia of 93.  Blood glucose was 184 and BUN 25.  Alk phos was significantly elevated at 517 with albumin of 2.8, AST of 76 and ALT of 116 with protein of 6.1 and lactic acid was 1.4.  CBC showed WBC of 10.7 and anemia close to baseline.     Imaging: Noncontrast head CT scan revealed chronic atrophic and ischemic changes without acute abnormality.  Brain MRI without contrast showed no acute abnormalities.  He had an MRA of the head and neck which revealed the following:  1. The right vertebral artery is not well visualized for the  entirety of its course, both extra cranially and intracranially,  likely occluded although slow flow or severe stenosis can appear  similar. Consider CTA head and neck for further evaluation.  2. Fusiform dilatation of the right cavernous ICA.  3. 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  4. Focal stenosis in the left P1 and left A1. Narrowing is also  noted at the origin of the right P3 branch.  5. The origin of the left vertebral artery and left common carotid  artery are not well visualized, secondary to artifact.".  CXR: "Interval development of small bilateral pleural effusions with  associated left basilar atelectasis.". °  °   °SLP Plan ° Continue with current plan of care ° °  °  °  Recommendations for follow up therapy are one component of a multi-disciplinary discharge planning process, led by the attending physician.  Recommendations may be updated based on patient status, additional functional criteria and insurance authorization.    Recommendations  Diet recommendations: Dysphagia 1 (puree);Thin liquid Liquids provided via: Cup;Straw Medication  Administration: Whole meds with puree Supervision: Staff to assist with self feeding;Full supervision/cueing for compensatory strategies Compensations: Minimize environmental distractions;Slow rate;Small sips/bites;Lingual sweep for clearance of pocketing;Multiple dry swallows after each bite/sip;Follow solids with liquid Postural Changes and/or Swallow Maneuvers: Out of bed for meals;Seated upright 90 degrees;Upright 30-60 min after meal                Oral Care Recommendations: Oral care BID;Oral care before and after PO;Staff/trained caregiver to provide oral care Follow Up Recommendations: Skilled nursing-short term rehab (<3 hours/day) Assistance recommended at discharge: Frequent or constant Supervision/Assistance SLP Visit Diagnosis: Dysphagia, oropharyngeal phase (R13.12) Plan: Continue with current plan of care         Cherrie Gauze, M.S., [facility] [phone] ([place])   Quintella Baton  04/01/2021, 11:14 AM

## 2021-04-01 NOTE — Progress Notes (Signed)
PT Cancellation Note  Patient Details Name: Matthew Miles. MRN: [id number] DOB: 05-27-1928   Cancelled Treatment:    Reason Eval/Treat Not Completed: Medical issues which prohibited therapy Per chart, patient with increased work of breathing since morning gradually worsened and now needing 6 L 02. Will hold on exertional activity and follow up tomorrow or as appropriate pending medical status.   Minna Merritts, PT, MPT  Percell Locus 04/01/2021, 3:58 PM

## 2021-04-01 NOTE — Progress Notes (Signed)
Nutrition Follow-up  DOCUMENTATION CODES:  Severe malnutrition in context of chronic illness  INTERVENTION:  Continue diet order per SLP.  Continue Ensure BID.  Continue Magic Cup TID.  Continue ProSource Plus TID.  Continue MVI with minerals daily.  Recommend adding feeding assistance with meals.  NUTRITION DIAGNOSIS:  Severe Malnutrition related to chronic illness (dementia, EtOH abuse) as evidenced by severe fat depletion, severe muscle depletion. - new diagnosis  GOAL:  Patient will meet greater than or equal to 90% of their needs. - not meeting  MONITOR:  PO intake, Supplement acceptance, Diet advancement, Labs, Weight trends, Skin, I & O's  REASON FOR ASSESSMENT:  Malnutrition Screening Tool    ASSESSMENT:  Adden Strout. is a 87 y.o. Caucasian male with medical history significant for CHF, COPD, coronary artery disease, type 2 diabetes mellitus, emphysema and GERD, hypertension and dyslipidemia, - currently undergoing evaluation at [facility] for splenic marginal zone B-cell lymphoma and prostate cancer who was recently admitted to [facility] for complications secondary to prostate cancer and lymphoma with obstructive process and likely UTI, ultimately discharged to skilled nursing facility given worsening weakness, questionable reports of sundowning at their facility prior to discharge, patient ultimately worsened while at rehab and presented here with similar episode of sundowning, delirium, hallucinations and mental status changes from baseline as well as profound weakness and general malaise. Neurology and ophthalmology consulted at intake given left eye weakness and vision changes. 12/18 - s/p BSE - advanced to dysphagia 2 diet with thin liquids  12/21 - downgraded to dysphagia 1 diet with thin liquids  Follow for GOC.  Attempted to speak with patient at bedside. Pt gave consent for physical exam but unable to provide any other history, as pt fell back asleep and would only  mumble incoherently when speaking in his sleep.  No family present at bedside.  Pt meets criteria for severe malnutrition now that exam has been performed.   Continue current nutrition plan and add feeding assistance due to confusion.  Supplements: ProSource Plus TID, Ensure BID, Magic Cup TID  Medications: reviewed; Vitamin C, ferrous sulfate, SSI, Levemir, MVI with minerals, Protonix, zinc sulfate, NaCl @ 100 ml/hr  Labs: reviewed; CBG 238-325 (H)  NUTRITION - FOCUSED PHYSICAL EXAM: Flowsheet Row Most Recent Value  Orbital Region Severe depletion  Upper Arm Region Moderate depletion  Thoracic and Lumbar Region No depletion  Buccal Region Severe depletion  Temple Region Severe depletion  Clavicle Bone Region Moderate depletion  Clavicle and Acromion Bone Region Severe depletion  Scapular Bone Region Unable to assess  Dorsal Hand Moderate depletion  Patellar Region Moderate depletion  Anterior Thigh Region Mild depletion  Posterior Calf Region Mild depletion  Edema (RD Assessment) None  Hair Reviewed  Eyes Reviewed  Mouth Reviewed  Skin Reviewed  Nails Reviewed   Diet Order:   Diet Order             DIET - DYS 1 Room service appropriate? Yes with Assist; Fluid consistency: Thin  Diet effective now                  EDUCATION NEEDS:  Not appropriate for education at this time  Skin:  Skin Assessment: Skin Integrity Issues: Skin Integrity Issues:: Stage III Stage III: sacrum  Last BM:  03/31/21 - Type 6, medium  Height:  Ht Readings from Last 1 Encounters:  03/26/21 5\' 4"  (1.626 m)   Weight:  Wt Readings from Last 1 Encounters:  03/26/21 76.7 kg  BMI:  Body mass index is 29.01 kg/m.  Estimated Nutritional Needs:  Kcal:  3143-8887 Protein:  90-105 grams Fluid:  > 1.7 L  Derrel Nip, RD, LDN (she/her/hers) Clinical Inpatient Dietitian RD Pager/After-Hours/Weekend Pager # in [place]

## 2021-04-02 ENCOUNTER — Encounter: Payer: Self-pay | Admitting: Family Medicine

## 2021-04-02 DIAGNOSIS — Z7189 Other specified counseling: Secondary | ICD-10-CM

## 2021-04-02 DIAGNOSIS — E43 Unspecified severe protein-calorie malnutrition: Secondary | ICD-10-CM | POA: Diagnosis present

## 2021-04-02 DIAGNOSIS — Z8546 Personal history of malignant neoplasm of prostate: Secondary | ICD-10-CM

## 2021-04-02 LAB — BASIC METABOLIC PANEL
Anion gap: 5 (ref 5–15)
BUN: 21 mg/dL (ref 8–23)
CO2: 28 mmol/L (ref 22–32)
Calcium: 8.3 mg/dL — ABNORMAL LOW (ref 8.9–10.3)
Chloride: 107 mmol/L (ref 98–111)
Creatinine, Ser: 1.06 mg/dL (ref 0.61–1.24)
GFR, Estimated: >60 mL/min (ref >60)
Glucose, Bld: 133 mg/dL — ABNORMAL HIGH (ref 70–99)
Potassium: 4.1 mmol/L (ref 3.5–5.1)
Sodium: 140 mmol/L (ref 135–145)

## 2021-04-02 LAB — GLUCOSE, CAPILLARY
Glucose-Capillary: 136 mg/dL — ABNORMAL HIGH (ref 70–99)
Glucose-Capillary: 144 mg/dL — ABNORMAL HIGH (ref 70–99)
Glucose-Capillary: 176 mg/dL — ABNORMAL HIGH (ref 70–99)
Glucose-Capillary: 93 mg/dL (ref 70–99)

## 2021-04-02 LAB — VITAMIN B1: Vitamin B1 (Thiamine): 188.5 nmol/L (ref 66.5–200.0)

## 2021-04-02 MED ORDER — PANTOPRAZOLE SODIUM 40 MG IV SOLR
40.0000 mg | INTRAVENOUS | Status: DC
  Administered 2021-04-02 – 2021-04-03 (×2): 40 mg via INTRAVENOUS
  Filled 2021-04-02 (×2): qty 40

## 2021-04-02 MED ORDER — SODIUM CHLORIDE 0.9 % IV SOLN
1.5000 g | Freq: Three times a day (TID) | INTRAVENOUS | Status: DC
  Administered 2021-04-02 – 2021-04-05 (×9): 1.5 g via INTRAVENOUS
  Filled 2021-04-02: qty 1.5
  Filled 2021-04-02: qty 4
  Filled 2021-04-02 (×4): qty 1.5
  Filled 2021-04-02: qty 4
  Filled 2021-04-02 (×2): qty 1.5
  Filled 2021-04-02: qty 4
  Filled 2021-04-02: qty 1.5

## 2021-04-02 MED ORDER — PANTOPRAZOLE 2 MG/ML SUSPENSION
20.0000 mg | Freq: Every day | ORAL | Status: DC
  Filled 2021-04-02: qty 20

## 2021-04-02 NOTE — Consult Note (Signed)
Consultation Note Date: 04/02/2021   Patient Name: Matthew Miles.  DOB: 1929-03-20  MRN: [id number]  Age / Sex: 87 y.o., male  PCP: Matthew Haven, MD Referring Physician: Nita Sells, MD  Reason for Consultation: Establishing goals of care  HPI/Patient Profile: 87 y.o. male  with past medical history of CHF, COPD, CAD, DM2, emphysema, GERD, HTN/HLD, history of pancreatitis, prostate cancer with radiation therapy, upper GIB/ulcer, OA admitted on 03/26/2021 with at risk for aspiration pneumonia, probable volume overload, toxic metabolic encephalopathy with hospital-acquired delirium.   Clinical Assessment and Goals of Care: I have reviewed medical records including EPIC notes, labs and imaging, received report from RN, assessed the patient.  Matthew Miles is lying quietly in bed.  He does not normally open his eyes and has to help him open manually.  He will speak with me, and is able to tell me his name, that we are in the hospital, but is a little unclear about the month.  His son and daughter are at bedside.  We meet to discuss diagnosis prognosis, GOC, EOL wishes, disposition and options.  I introduced Palliative Medicine as specialized medical care for people living with serious illness. It focuses on providing relief from the symptoms and stress of a serious illness. The goal is to improve quality of life for both the patient and the family.  We discussed a brief life review of the patient.  Matthew Miles's second wife died about 8 years ago.  He had been living independently, independent with ADLs/IADLs until about a month ago when he became sick and was treated at [facility].  After leaving [facility].  [facility] day and then transferred to [facility].    We then focused on their current illness.  We talked about his  acute and chronic health concerns.  We talked about the treatment plan.  We talked about some "what if's and maybe's".  We talked about time for outcomes.  I share that it remains to be seen for Matthew Miles's ability to recover.  We talked about "meaningful recovery".  At this point they are requesting time for outcomes, short-term rehab.  Advanced directives, concepts specific to code status, and rehospitalization were considered and discussed.  We talked about "treat the treatable" care.  We talked about transitioning to residential hospice if/when Matthew Miles becomes sick again.  Palliative Care services outpatient were explained and offered.  Family is agreeable to outpatient palliative services.  Provider choice offered, they choose [facility].  They shared that they have known people who had in-home and residential hospice but have not experienced hospice in their own family.  Discussed the importance of continued conversation with family and the medical providers regarding overall plan of care and treatment options, ensuring decisions are within the context of the patients values and GOCs.  Questions and concerns were addressed.  The family was encouraged to call with questions or concerns.  PMT will continue  to support holistically.  Conference with attending, bedside nursing staff, transition of care team related to patient condition, needs, goals of care, disposition.   HCPOA  HCPOA -son, Matthew Miles is healthcare surrogate but he coordinates with his sister-in-law Matthew Miles.    SUMMARY OF RECOMMENDATIONS   At this point continue to treat the treatable but no CPR or intubation Time for outcomes [facility]   Code Status/Advance Care Planning: DNR -goldenrod form completed and placed on chart  Symptom Management:  Per hospitalist, no additional needs at this time.  Palliative Prophylaxis:  Oral Care and  Turn Reposition  Additional Recommendations (Limitations, Scope, Preferences): Continue to treat the treatable but no CPR or intubation.  Psycho-social/Spiritual:  Desire for further Chaplaincy support:no Additional Recommendations: Caregiving  Support/Resources and Education on Hospice  Prognosis:  Unable to determine, based on outcomes.  Guarded at this point.  The next few days to weeks will determine Matthew Miles's ability to have meaningful recovery.  Discharge Planning:  At this point, anticipate returning to short-term [facility]hab at [facility] with outpatient palliative services.       Primary Diagnoses: Present on Admission:  Third nerve palsy of left eye  COPD (chronic obstructive pulmonary disease) (HCC)  Coronary artery disease of native artery of native heart with stable angina pectoris ([place])  Essential hypertension  GERD (gastroesophageal reflux disease)  Hyperlipidemia  Prostate cancer ([place])  Thrombocytopenia ([place])   I have reviewed the medical record, interviewed the patient and family, and examined the patient. The following aspects are pertinent.  Past Medical History:  Diagnosis Date   Allergy    Arthritis    CHF (congestive heart failure) (HCC)    Chicken pox    Cholecystitis    Colon polyps    COPD (chronic obstructive pulmonary disease) (HCC)    Coronary artery disease    Diabetes mellitus without complication (HCC)    Emphysema of lung (HCC)    GERD (gastroesophageal reflux disease)    Heart murmur    Hematemesis/vomiting blood 04/10/11   Hypercholesterolemia    Mild hypertension    Pancreatitis    Prostate cancer ([place])    Prostate cancer ([place])    radiation therapy    Smoker    Ulcer    Upper GI bleed 1980   Social History   Socioeconomic History   Marital status: Widowed    Spouse name: Not on file   Number of children: 2   Years of education: Not on file   Highest education level: Not on file  Occupational History   Occupation:  Retired    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 Wife in nursing home. Has 2 children.      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      Diet - regular diet   Exercise - bike  Social Determinants of Health   Financial Resource Strain: Low Risk    Difficulty of Paying Living Expenses: Not hard at all  Food Insecurity: No Food Insecurity   Worried About Charity fundraiser in the Last Year: Never true   [place] in the Last Year: Never true  Transportation Needs: No Transportation Needs   Lack of Transportation (Medical): No   Lack of Transportation (Non-Medical): No  Physical Activity: Insufficiently Active   Days of Exercise per Week: 3 days   Minutes of Exercise per Session: 30 min  Stress: No Stress Concern Present   Feeling of Stress : Not at all  Social Connections: Unknown   Frequency of Communication with Friends and Family: More than three times a week   Frequency of Social Gatherings with Friends and Family: More than three times a week   Attends Religious Services: Not on file   Active Member of Clubs or Organizations: Yes   Attends Archivist Meetings: Not on file   Marital Status: Widowed   Family History  Problem Relation Age of Onset   Prostate cancer Father    Liver cancer Brother    Prostate cancer Brother    Emphysema Sister        smoker   Scheduled Meds:  alfuzosin  10 mg Oral Q breakfast   aspirin EC  81 mg Oral Daily   Chlorhexidine Gluconate Cloth  6 each Topical  Daily   dextromethorphan-guaiFENesin  1 tablet Oral BID   enoxaparin (LOVENOX) injection  40 mg Subcutaneous Q24H   feeding supplement  237 mL Oral BID BM   finasteride  5 mg Oral Daily   fluconazole  200 mg Oral Daily   fluticasone  2 spray Each Nare Daily   furosemide  40 mg Intravenous BID   insulin aspart  0-6 Units Subcutaneous TID WC   insulin aspart  2 Units Subcutaneous TID WC   insulin detemir  5 Units Subcutaneous Daily   lidocaine  1 patch Transdermal Q24H   mouth rinse  15 mL Mouth Rinse BID   pantoprazole  20 mg Oral Daily   tiotropium  1 capsule Inhalation Daily   valACYclovir  500 mg Oral BID   Continuous Infusions:  ampicillin-sulbactam (UNASYN) IV     PRN Meds:.acetaminophen **OR** acetaminophen (TYLENOL) oral liquid 160 mg/5 mL **OR** acetaminophen, albuterol, senna-docusate Medications Prior to Admission:  Prior to Admission medications   Medication Sig Start Date End Date Taking? Authorizing Provider  Accu-Chek FastClix Lancets MISC Inject 1 each into the skin in the morning and at bedtime. 02/18/21  Yes Matthew Haven, MD  ACCU-CHEK GUIDE test strip TEST BLOOD SUGAR TWICE DAILY AS DIRECTED 02/22/21  Yes Matthew Haven, MD  acetaminophen (TYLENOL) 325 MG tablet Take 650 mg by mouth every 6 (six) hours as needed.   Yes [provider]  alfuzosin (UROXATRAL) 10 MG 24 hr tablet Take 1 tablet (10 mg total) by mouth daily with breakfast. 08/27/19  Yes Matthew Haven, MD  ascorbic acid (VITAMIN C) 500 MG tablet Take 500 mg by mouth 2 (two) times daily.   Yes [provider]  aspirin 81 MG tablet Take 81 mg by mouth daily.    Yes [provider]  blood glucose meter kit and supplies KIT Dispense based on patient and insurance preference. Use once daily as directed. Dx Code E11.9 02/15/21  Yes Matthew Haven, MD  Calcium Citrate-Vitamin D (CALCIUM CITRATE +  D PO) Take by mouth. 1200 mg of calcium and [phone] units of vitamin D daily    Yes [provider]  carvedilol (COREG) 6.25 MG tablet TAKE 1 TABLET TWICE DAILY Patient taking differently: Take 12.5 mg by mouth 2 (two) times daily with a meal. TAKE 1 TABLET TWICE DAILY 11/09/20  Yes Gollan, Kathlene November, MD  collagenase (SANTYL) ointment Apply 1 application topically daily.   Yes [provider]  ferrous sulfate 325 (65 FE) MG EC tablet Take 325 mg by mouth daily.   Yes [provider]  finasteride (PROSCAR) 5 MG tablet Take 5 mg by mouth daily.   Yes [provider]  fluconazole (DIFLUCAN) 100 MG tablet Take 100 mg by mouth daily.   Yes [provider]  fluticasone (FLONASE) 50 MCG/ACT nasal spray Place 2 sprays into both nostrils daily.   Yes [provider]  insulin lispro (HUMALOG) 100 UNIT/ML injection Inject 1-4 Units into the skin 3 (three) times daily before meals. Per sliding scale   Yes [provider]  lidocaine (LIDODERM) 5 % Place 1 patch onto the skin daily. Remove & Discard patch within 12 hours or as directed by MD   Yes [provider]  melatonin 3 MG TABS tablet Take 3 mg by mouth at bedtime.   Yes [provider]  metFORMIN (GLUCOPHAGE-XR) 500 MG 24 hr tablet TAKE 2 TABLETS(1000 MG) BY MOUTH TWICE DAILY 09/21/20  Yes Matthew Haven, MD  Multiple Vitamin (MULTIVITAMIN WITH MINERALS) TABS tablet Take 1 tablet by mouth daily.   Yes [provider]  mupirocin ointment (BACTROBAN) 2 % 1 application 2 (two) times daily as needed.   Yes [provider]  nitroGLYCERIN (NITROSTAT) 0.4 MG SL tablet Place 1 tablet (0.4 mg total) under the tongue every 5 (five) minutes as needed. 11/09/20  Yes Gollan, Kathlene November, MD  oxyCODONE (OXY IR/ROXICODONE) 5 MG immediate release tablet Take 5 mg by mouth every 6 (six) hours as needed for severe pain.   Yes [provider]  pantoprazole (PROTONIX) 20 MG tablet TAKE 1 TABLET EVERY DAY 12/04/20  Yes Matthew Haven, MD   pravastatin (PRAVACHOL) 40 MG tablet Take 1 tablet (40 mg total) by mouth daily. 11/09/20  Yes Minna Merritts, MD  PROAIR HFA 108 [phone] Base) MCG/ACT inhaler INHALE 2 PUFFS INTO THE LUNGS EVERY 6 HOURS AS NEEDED FOR WHEEZING OR SHORTNESS OF BREATH 10/03/16  Yes Matthew Haven, MD  Tiotropium Bromide Monohydrate (SPIRIVA RESPIMAT) 2.5 MCG/ACT AERS Inhale 2 puffs into the lungs daily. 04/01/20  Yes Matthew Haven, MD  torsemide (DEMADEX) 10 MG tablet TAKE 1 TABLET(10 MG) BY MOUTH TWICE DAILY AS NEEDED Patient taking differently: Take 20 mg by mouth daily. Per [name] 11/09/20  Yes Gollan, Kathlene November, MD  traZODone (DESYREL) 50 MG tablet Take 50 mg by mouth at bedtime as needed for sleep.   Yes [provider]  TRULICITY 1.5 XW/9.6EA SOPN INJECT 1.5MG (1 PEN) SUBCUTANEOUSLY EVERY WEEK 12/15/20  Yes Matthew Haven, MD  valACYclovir (VALTREX) 500 MG tablet Take 500 mg by mouth 2 (two) times daily.   Yes [provider]  zinc sulfate 220 (50 Zn) MG capsule Take 220 mg by mouth daily.   Yes [provider]   Allergies  Allergen Reactions   Oxybutynin     Dry mouth and constant urination.   Sulfa Antibiotics     GI upset   Tradjenta [Linagliptin] Other (See Comments)  Hair loss   Review of Systems  Unable to perform ROS: Acuity of condition   Physical Exam Vitals and nursing note reviewed.  Constitutional:      General: He is not in acute distress.    Appearance: He is ill-appearing.  HENT:     Mouth/Throat:     Mouth: Mucous membranes are moist.  Cardiovascular:     Rate and Rhythm: Normal rate.  Pulmonary:     Effort: Pulmonary effort is normal. No respiratory distress.  Skin:    General: Skin is warm and dry.  Neurological:     Mental Status: He is alert.     Comments: Oriented to person, place, somewhat to situation  Psychiatric:     Comments: Calm and cooperative    Vital Signs: BP 128/69 (BP Location: Left Arm)    Pulse 80    Temp 98.8  F (37.1 C)    Resp 20    Ht 5' 4"  (1.626 m)    Wt 76.7 kg    SpO2 96%    BMI 29.01 kg/m  Pain Scale: 0-10   Pain Score: Asleep   SpO2: SpO2: 96 % O2 Device:SpO2: 96 % O2 Flow Rate: .O2 Flow Rate (L/min): 6 L/min  IO: Intake/output summary:  Intake/Output Summary (Last 24 hours) at 04/02/2021 1302 Last data filed at 04/02/2021 9244 Gross per 24 hour  Intake 902.01 ml  Output 1525 ml  Net -622.99 ml    LBM: Last BM Date: 03/31/21 Baseline Weight: Weight: 76.7 kg Most recent weight: Weight: 76.7 kg     Palliative Assessment/Data:   Flowsheet Rows    Flowsheet Row Most Recent Value  Intake Tab   Referral Department Hospitalist  Unit at Time of Referral Other (Comment)  Palliative Care Primary Diagnosis Other (Comment)  Date Notified 04/02/21  Palliative Care Type New Palliative care  Reason for referral Clarify Goals of Care  Date of Admission 03/26/21  Date first seen by Palliative Care 04/02/21  # of days Palliative referral response time 0 Day(s)  # of days IP prior to Palliative referral 7  Clinical Assessment   Palliative Performance Scale Score 30%  Pain Max last 24 hours Not able to report  Pain Min Last 24 hours Not able to report  Dyspnea Max Last 24 Hours Not able to report  Dyspnea Min Last 24 hours Not able to report  Psychosocial & Spiritual Assessment   Palliative Care Outcomes        Time In: 0840 Time Out: 0950 Time Total: 70 minutes  Greater than 50%  of this time was spent counseling and coordinating care related to the above assessment and plan.  Signed by: Drue Novel, NP   Please contact Palliative Medicine Team phone at [phone] for questions and concerns.  For individual provider: See Shea Evans

## 2021-04-02 NOTE — Progress Notes (Signed)
Respiratory, SLP, and Case Management consulted for patient this morning. Family at bedside. Orma Flaming, RN

## 2021-04-02 NOTE — Progress Notes (Signed)
Notified by RN that patient more lethargic with occupational therapy  Examined bedside OBJECTIVE:/e BP (!) 120/56    Pulse 83    Temp 98.7 F (37.1 C) (Oral)    Resp 20    Ht 5\' 4"  (1.626 m)    Wt 76.7 kg    SpO2 98%    BMI 29.01 kg/m  Awakens know s place time Cannot name nurse No distress  P Stop aspirin guaifenesin feeding supplement fluconazole valacyclovir and change what ever else able to p.o. Keep n.p.o. at this time--hold tonight lasix dose resume in am Minimize sedating agents  Reassess mentation as needed  D/w nursing  Verneita Griffes, MD [facility] 6:15 PM

## 2021-04-02 NOTE — Progress Notes (Signed)
Physical Therapy Treatment Patient Details Name: Matthew Miles. MRN: [id number] DOB: 1929/04/05 Today's Date: 04/02/2021   History of Present Illness Pt is a 87 y/o M admitted on 03/26/21 with c/c of acute onset of generalized weakness & malaise & inability to open the L eye. CT & brain MRI showed no acute abnormalities. 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 PMH: CHF, COPD, CAD, DM2, emphyseam, GERD, HTN, dyslipidemia, prostate CA    PT Comments    MD cleared pt for participation in session. Pt received in bed, lethargic but does respond to direct commands throughout session. Pt requires total assist for supine<>sit with inability to participate (move BLE towards EOB) but pt is able to sit EOB with mod assist x ~1-2 minutes. Pt assisted back supine & PT assisted with positioning upright in bed as pt with L lateral lean. During session pt coughing up red tinged frothy sputum & suctioned with yaunker - nurse & MD made aware. Provided total assist for changing into clean gown.  Pt on 6L/min via HFNC, at rest SpO2 >90% Upon sitting EOB 79% but recovers to 85-87% Returned to semi fowler in bed, >90% Nurse made aware of O2 during session   Recommendations for follow up therapy are one component of a multi-disciplinary discharge planning process, led by the attending physician.  Recommendations may be updated based on patient status, additional functional criteria and insurance authorization.  Follow Up Recommendations  Skilled nursing-short term rehab (<3 hours/day)     Assistance Recommended at Discharge Frequent or constant Supervision/Assistance  Equipment Recommendations  None recommended by PT    Recommendations for Other Services       Precautions / Restrictions Precautions Precautions: Fall Restrictions Weight Bearing Restrictions: No     Mobility  Bed Mobility Overal bed mobility: Needs Assistance Bed Mobility: Supine  to Sit;Sit to Supine     Supine to sit: Total assist;HOB elevated Sit to supine: Total assist;HOB elevated        Transfers                        Ambulation/Gait                   Stairs             Wheelchair Mobility    Modified Rankin (Stroke Patients Only)       Balance Overall balance assessment: Needs assistance Sitting-balance support: Bilateral upper extremity supported;Feet supported Sitting balance-Leahy Scale: Poor Sitting balance - Comments: L lateral lean, slight posterior lean                                    Cognition Arousal/Alertness: Lethargic   Overall Cognitive Status: Difficult to assess                                 General Comments: Pt with eyes closed throughout session, does respond to direct commands (correctly responds ~75% of the time) but doesn't engage in conversation. Lethargic.        Exercises      General Comments        Pertinent Vitals/Pain Pain Assessment: Faces Faces Pain Scale: No hurt    Home Living  Prior Function            PT Goals (current goals can now be found in the care plan section) Acute Rehab PT Goals Patient Stated Goal: none stated PT Goal Formulation: Patient unable to participate in goal setting Time For Goal Achievement: 04/10/21 Potential to Achieve Goals: Fair Progress towards PT goals: PT to reassess next treatment    Frequency    Min 2X/week      PT Plan Current plan remains appropriate    Co-evaluation              AM-PAC PT "6 Clicks" Mobility   Outcome Measure  Help needed turning from your back to your side while in a flat bed without using bedrails?: Total Help needed moving from lying on your back to sitting on the side of a flat bed without using bedrails?: Total Help needed moving to and from a bed to a chair (including a wheelchair)?: Total Help needed standing up from  a chair using your arms (e.g., wheelchair or bedside chair)?: Total Help needed to walk in hospital room?: Total Help needed climbing 3-5 steps with a railing? : Total 6 Click Score: 6    End of Session Equipment Utilized During Treatment: Oxygen Activity Tolerance: Patient limited by fatigue Patient left: in bed;with call bell/phone within reach;with bed alarm set Nurse Communication:  (pt appearing to have blood tinged sputum) PT Visit Diagnosis: Unsteadiness on feet (R26.81);Difficulty in walking, not elsewhere classified (R26.2);Muscle weakness (generalized) (M62.81)     Time: 2620-3559 PT Time Calculation (min) (ACUTE ONLY): 14 min  Charges:  $Therapeutic Activity: 8-22 mins                     Lavone Nian, PT, DPT 04/02/21, 1:31 PM    Waunita Schooner 04/02/2021, 1:20 PM

## 2021-04-02 NOTE — Progress Notes (Signed)
Occupational Therapy Treatment Patient Details Name: Matthew Miles. MRN: [id number] DOB: Jul 14, 1928 Today's Date: 04/02/2021   History of present illness Pt is a 87 y/o M admitted on 03/26/21 with c/c of acute onset of generalized weakness & malaise & inability to open the L eye. CT & brain MRI showed no acute abnormalities. 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 PMH: CHF, COPD, CAD, DM2, emphyseam, GERD, HTN, dyslipidemia, prostate CA   OT comments  Pt seen for OT tx this date. Multiple modes of stimuli used to encourage safe therapy participation. Pt requires MAX/TOTAL A with use of trendelenberg setting, to advance towards HOB. Once optimized, OT uses chair position setting on bed to place into better position for self-feeding. Pt engaged in two bites with MOD A for hand to mouth and cues to attend to task. He is noted to cough with this small amount of food within his prescribed food texture and at this time, OT stops attempting to engage pt in feeding and notifies RN. Pt left with all needs met and in reach. Will continue to follow.    Recommendations for follow up therapy are one component of a multi-disciplinary discharge planning process, led by the attending physician.  Recommendations may be updated based on patient status, additional functional criteria and insurance authorization.    Follow Up Recommendations  Skilled nursing-short term rehab (<3 hours/day)    Assistance Recommended at Discharge Frequent or constant Supervision/Assistance  Equipment Recommendations  Other (comment) (defer)    Recommendations for Other Services      Precautions / Restrictions Precautions Precautions: Fall Restrictions Weight Bearing Restrictions: No       Mobility Bed Mobility               General bed mobility comments: MAX/TOTAL A with trendelenberg to reposition pt in bed. Then chair position setting on bed used to sit up for  meal time    Transfers                         Balance                                           ADL either performed or assessed with clinical judgement   ADL Overall ADL's : Needs assistance/impaired Eating/Feeding: Moderate assistance;Sitting Eating/Feeding Details (indicate cue type and reason): chair position in bed, only performed hand to mouth with two scoops of applesauce and noted to cough even with this consistency and sitting all the way up and stimulated to attend to task. Feeding stopped at this time. RN notified.                                        Extremity/Trunk Assessment              Vision       Perception     Praxis      Cognition Arousal/Alertness: Lethargic Behavior During Therapy: Impulsive Overall Cognitive Status: Difficult to assess Area of Impairment: Orientation;Following commands;Safety/judgement;Attention;Awareness;Memory;Problem solving                 Orientation Level: Disoriented to;Situation;Place;Time   Memory: Decreased recall of precautions;Decreased short-term memory Following Commands: Follows  one step commands inconsistently Safety/Judgement: Decreased awareness of safety;Decreased awareness of deficits Awareness: Intellectual;Emergent;Anticipatory Problem Solving: Slow processing;Decreased initiation;Requires tactile cues;Requires verbal cues;Difficulty sequencing General Comments: Pt with eyes closed throughout session, does respond to direct commands (correctly responds ~75% of the time) but doesn't engage in conversation. Lethargic.          Exercises Other Exercises Other Exercises: cues to attend to self-feeding. MOD A for hand to mouth, noted to cough and self-feeding terminated at this time, RN updated.   Shoulder Instructions       General Comments      Pertinent Vitals/ Pain       Pain Assessment: Faces Faces Pain Scale: No hurt  Home Living                                           Prior Functioning/Environment              Frequency  Min 2X/week        Progress Toward Goals  OT Goals(current goals can now be found in the care plan section)  Progress towards OT goals: OT to reassess next treatment  Acute Rehab OT Goals Patient Stated Goal: none stated OT Goal Formulation: With patient Time For Goal Achievement: 04/10/21 Potential to Achieve Goals: [place] Discharge plan remains appropriate;Frequency remains appropriate    Co-evaluation                 AM-PAC OT "6 Clicks" Daily Activity     Outcome Measure   Help from another person eating meals?: A Little Help from another person taking care of personal grooming?: A Lot Help from another person toileting, which includes using toliet, bedpan, or urinal?: A Lot Help from another person bathing (including washing, rinsing, drying)?: A Lot Help from another person to put on and taking off regular upper body clothing?: A Little Help from another person to put on and taking off regular lower body clothing?: A Lot 6 Click Score: 14    End of Session Equipment Utilized During Treatment: Gait belt;Rolling walker (2 wheels)  OT Visit Diagnosis: Other abnormalities of gait and mobility (R26.89)   Activity Tolerance Patient tolerated treatment well   Patient Left in bed;with call bell/phone within reach;with bed alarm set   Nurse Communication Mobility status        Time: 7893-8101 OT Time Calculation (min): 9 min  Charges: OT General Charges $OT Visit: 1 Visit OT Treatments $Self Care/Home Management : 8-22 mins  Gerrianne Scale, [place], OTR/L ascom [phone] 04/02/21, 5:35 PM

## 2021-04-02 NOTE — Progress Notes (Addendum)
[place] [facility]) Hospital Liaison Note  Notified of patient/family request for [facility] Palliative services at [facility] after discharge.  [facility] hospital liaison will follow patient for discharge disposition.  Please call with any hospice or outpatient palliative care related questions.  Thank you for the opportunity to participate in this patient's care.  Nadene Rubins, RN, BSN [place] [phone]

## 2021-04-02 NOTE — Progress Notes (Addendum)
PROGRESS NOTE   Matthew Miles.  PXT:[id number] DOB: 1929-02-17 DOA: 03/26/2021 PCP: [name], MD  Brief Narrative:   87 year old white male known history of  prostate cancer hormone sensitive foll Dr Karolee Stamps on intermittent androgen deprivation therapy since 10/2017--radiation early 2000 Splenic marginal zone lymphoma--lymphoma 2020 status post Rituxan X 4 weeks maintenance Rituxan Gastrostomy 2013 for bleeding ulcer hemorrhoidectomy in the 60s COPD CAD with CHF  Recent hospitalization [facility] 12 8--03/25/2021--diagnosed with bladder spasm overflow incontinence and urinary tract infection treated with Augmentin as well as antifungals Myrbetriq stopped patient sent home on low-dose trazodone and [place]  [facility] ED 2-day history of left eyelid ptosis plus generalized malaise worsening left eye vision in addition to hallucinations Seen by neurology and ophthalmology-ophthalmology follow-up for left internuclear ophthalmoplegia  Hospital-Problem based course   At risk for aspiration pneumonia Probable volume overload and acute resp failure on 12/22 [ see note] Multifactorial resp failure  AECHF> PNA Continue Lasix 40 IV twice daily  Net neg -1.6 liters--aim for net neg balance--labs am Check CXR in am and d/c Unasyn if no concern PNA SLP recs Dys 1 diet Toxic metabolic encephalopathy with hospital-acquired delirium hallucinations?  Wernicke's versus polypharmacy Seems slightly improved  completed thiamine 500 3 times daily as per neurology Continue MVI Follow-up thiamine level--- myasthenia gravis panel is negative Left internuclear ophthalmoplegia Possibly secondary to vertebrobasilar insufficiency on MRI Defer to neurology B-cell lymphoma diagnosed 2020 Prostate cancer Outpatient follow-up at [facility] and continue Tixagevimab once follows up as an outpatient NIDDM type II CBGs 130-150 eating minimally--continue sliding scale along with 2 units  3 times a day meals, Levemir 5 Home metformin on hold BPH Continue Foley catheter which was placed at outside hospital and will need outpatient urology follow-up Continue alfuzosin 10 daily and finasteride 5 daily PTA UTI-Yeast  Complete Fluconazole 12/26 AKI Torsemide from prior to admission on hold Saline lock IV Decubitus ulcer prior to admission As per wound nurse  DVT prophylaxis: Lovenox Code Status: Status change after discussion with family to DNR on 12/22 Family Communication: Discussed with daughter/son at the bedside last on 12/22  Disposition:  Status is: Inpatient  Remains inpatient appropriate because: Not ready for discharge still somewhat encephalopathic   Consultants: no  Procedures:   Antimicrobials:   unasayn  Subjective:  Much improved today On 2 liter sNC no cp no fever Eating some   Objective: Vitals:   04/02/21 0758 04/02/21 0900 04/02/21 1126 04/02/21 1400  BP: (!) 152/61  128/69   Pulse: 91  80   Resp: 20  20   Temp: 99.1 F (37.3 C)  98.8 F (37.1 C)   TempSrc: Oral     SpO2: 95% 93% 96% 96%  Weight:      Height:        Intake/Output Summary (Last 24 hours) at 04/02/2021 1450 Last data filed at 04/02/2021 1300 Gross per 24 hour  Intake 902.01 ml  Output 2725 ml  Net -1822.99 ml    Filed Weights   03/26/21 1645  Weight: 76.7 kg    Examination:  Ptosis bilaterally--answers more appropriately to orienting quesitons Cannot assess ocular movements secondary to poor cooperation Neck soft supple Mallampati 4 On oxygen Mod ae bilaterally--no added sounds today ROM intact S1-S2 no murmur no rub no gallop Neurologically intact   Data Reviewed: personally reviewed   CBC    Component Value Date/Time   WBC 12.1 (H) 04/01/2021 0445   RBC 3.06 (L)  04/01/2021 0445   HGB 9.4 (L) 04/01/2021 0445   HGB 11.0 (L) 11/13/2017 1425   HCT 29.7 (L) 04/01/2021 0445   HCT 35.4 (L) 11/13/2017 1425   PLT 148 (L) 04/01/2021 0445    PLT 153 08/27/2012 1145   MCV 97.1 04/01/2021 0445   MCV 85 08/27/2012 1145   MCH 30.7 04/01/2021 0445   MCHC 31.6 04/01/2021 0445   RDW 14.9 04/01/2021 0445   RDW 16.5 (H) 08/27/2012 1145   LYMPHSABS 0.8 03/26/2021 1641   LYMPHSABS 1.4 08/27/2012 1145   MONOABS 1.0 03/26/2021 1641   MONOABS 0.9 08/27/2012 1145   EOSABS 0.1 03/26/2021 1641   EOSABS 0.0 08/27/2012 1145   BASOSABS 0.0 03/26/2021 1641   BASOSABS 0.0 08/27/2012 1145   CMP Latest Ref Rng & Units 04/02/2021 04/01/2021 03/31/2021  Glucose 70 - 99 mg/dL 133(H) 280(H) 287(H)  BUN 8 - 23 mg/dL 21 27(H) 33(H)  Creatinine 0.61 - 1.24 mg/dL 1.06 0.92 1.18  Sodium 135 - 145 mmol/L 140 135 136  Potassium 3.5 - 5.1 mmol/L 4.1 4.1 4.0  Chloride 98 - 111 mmol/L 107 102 103  CO2 22 - 32 mmol/L 28 28 28   Calcium 8.9 - 10.3 mg/dL 8.3(L) 8.3(L) 8.2(L)  Total Protein 6.5 - 8.1 g/dL - - -  Total Bilirubin 0.3 - 1.2 mg/dL - - -  Alkaline Phos 38 - 126 U/L - - -  AST 15 - 41 U/L - - -  ALT 0 - 44 U/L - - -     Radiology Studies: DG Chest Port 1 View  Result Date: 04/01/2021 CLINICAL DATA:  Pneumonia. EXAM: PORTABLE CHEST 1 VIEW COMPARISON:  Radiographs 03/26/2021 and 03/04/2021.  CT 04/09/2006. FINDINGS: 1344 hours. Lower lung volumes. The heart size is stable at the upper limits of normal. There are enlarging left greater than right pleural effusions with associated bibasilar pulmonary opacities and increasingly poor definition of the pulmonary vasculature. Findings may reflect pulmonary edema, although pneumonia at the left lung base cannot be excluded. No evidence of pneumothorax. No acute osseous findings are seen. Evidence of a chronic rotator cuff tear on the right. IMPRESSION: Worsening bilateral pleural effusions and bilateral airspace opacities which may reflect congestive heart failure or pneumonia. Radiographic follow up recommended. Electronically Signed   By: Richardean Sale M.D.   On: 04/01/2021 14:01     Scheduled  Meds:  alfuzosin  10 mg Oral Q breakfast   aspirin EC  81 mg Oral Daily   Chlorhexidine Gluconate Cloth  6 each Topical Daily   dextromethorphan-guaiFENesin  1 tablet Oral BID   enoxaparin (LOVENOX) injection  40 mg Subcutaneous Q24H   feeding supplement  237 mL Oral BID BM   finasteride  5 mg Oral Daily   fluconazole  200 mg Oral Daily   fluticasone  2 spray Each Nare Daily   furosemide  40 mg Intravenous BID   insulin aspart  0-6 Units Subcutaneous TID WC   insulin aspart  2 Units Subcutaneous TID WC   insulin detemir  5 Units Subcutaneous Daily   lidocaine  1 patch Transdermal Q24H   mouth rinse  15 mL Mouth Rinse BID   pantoprazole  20 mg Oral Daily   tiotropium  1 capsule Inhalation Daily   valACYclovir  500 mg Oral BID   Continuous Infusions:  ampicillin-sulbactam (UNASYN) IV        LOS: 6 days   Time spent: 20  Nita Sells, MD [facility] To contact the  attending provider between 7A-7P or the covering provider during after hours 7P-7A, please log into the web site [email or web address] and access using universal [place] password for that web site. If you do not have the password, please call the hospital operator.  04/02/2021, 2:50 PM

## 2021-04-02 NOTE — Progress Notes (Addendum)
Speech Language Pathology Treatment: Dysphagia  Patient Details Name: Matthew Miles. MRN: [id number] DOB: [birth year] Today's Date: 04/02/2021 Time: 5009-3818 SLP Time Calculation (min) (ACUTE ONLY): 40 min  Assessment / Plan / Recommendation Clinical Impression  Pt seen for ongoing assessment of swallowing; toleration of po's for oral diet. He is currently NPO after being on a dysphagia level 1 w/ thins; though unable to wear his Dentures d/t ill-fit and gum soreness. He had initially presented from [facility] to [facility] ED 2-day history of left eyelid ptosis plus generalized malaise worsening left eye vision in addition to hallucinations. In the last 2 days, MD suspects "Probable volume overload; pt more tachypneic using accessory muscles and requiring higher levels of oxygen, previously on 2 L now on 6 L oxygen.". CXR: shows capitalization of the right heart border and diffuse infiltrates bilaterally-concerning more for volume overload than pneumonia.   Pt was NPO yesterday. This morning he awakens easily to name and verbally responsive but eyes closed during session and pt muttered to himself w/ overt Confusion noted asking this SLP, "did you ride the donkey over to the house" -- unsure of pt's Baseline Cognitive functioning(see Head Imaging). Also, pt's Family stated he lived alone [facility] and he is currently ill. Pt does have poor vision per Family. Pt is on [place] O2 support at 6L. Family not present during session.     Pt explained general aspiration precautions and agreed verbally to the need for following them especially sitting upright for all oral intake -- MAX support to sit pt fully upright in the bed. Pt seemed unaware.    He required total assistance w/ self-feeding but this SLP assisted pt in holding cup to drink. Trials of Nectar liquids given via TSP/straw, then puree. No immediate, overt clinical s/s of aspiration were noted w/ any consistency; respiratory status  remained calm and unlabored, vocal quality clear b/t trials. Oral phase appeared grossly [facility] for bolus management and timely A-P transfer for swallowing of the purees, Nectar liquids. No trials of the minced/moist trials(solid) or thin liquids given d/t Edentulous status and increased Confusion/current illness. Oral clearing achieved w/ the puree and thin liquid consistencies.   Spoke w/ Daughter by phone re: the risk for aspiration of thin liquids at advanced age w/ extended, and chronic, illnesses including Pulmonary issues, Confusion impacting awareness, drinking in bed; discussed the importance of swallowing Pills Crushed in a Puree -- the Puree providing cohesion for swallowing tablets. Discussed POC that pt would remain on this diet for Discharge to Rehab and can then improve medically b/f being seen for trials to upgrade diet in the future(~3-4 weeks or so). Daughter agreed.      Pt appears at risk for aspiration d/t co-morbidities and current illness w/ advance age. ANY Cognitive decline can decreased awareness during oral intake/swallowing; can also impact overall desire for oral intake. Recommend Dietician f/u. When following aspiration precautions and using a PUREE diet w/ Nectar liquids, risk for aspiration appears Reduced. Recommend dys. Level 1(PUREE foods) diet for ease of oral phase w/ gravies added to moisten foods; Nectar liquids -- monitor all straw drinking and remove straws IF increased coughing noted. Recommend aspiration precautions; Pills Crushed in Puree; tray setup and positioning assistance for meals. Support w/ feeding at meals. REFLUX precautions d/t pt's baseline.  ST services recommends this diet consistency for Discharge d/t pt's overall medical and Cognitive presentation and risk for aspiration thus pulmonary decline. Recommend Palliative Care f/u for GOC.  ST services can follow at SNF when appropriate; no further acute needs currently. NSG/MD updated. Precautions posted at  bedside.      HPI HPI: Per 30 H&P "Matthew Kail. is a 87 y.o. Caucasian male with medical history significant for CHF, COPD, coronary artery disease, type 2 diabetes mellitus, emphysema and GERD, hypertension and dyslipidemia, who presented to the ER with acute onset of generalized weakness and malaise and inability to open the left eye.  He was noted to be unable to move his left eye immediately.  No blurred vision or diplopia.  No paresthesias or focal muscle weakness.  No headache or dizziness.  No vertigo or tinnitus.  He has been having visual hallucinations and has been trying to hold to things in the ER.  No dysuria, oliguria or hematuria or flank pain.     ED Course: When he came to the ER vital signs were within normal.  Labs revealed hyponatremia 127 with hypochloremia of 93.  Blood glucose was 184 and BUN 25.  Alk phos was significantly elevated at 517 with albumin of 2.8, AST of 76 and ALT of 116 with protein of 6.1 and lactic acid was 1.4.  CBC showed WBC of 10.7 and anemia close to baseline.     Imaging: Noncontrast head CT scan revealed chronic atrophic and ischemic changes without acute abnormality.  Brain MRI without contrast showed no acute abnormalities.  He had an MRA of the head and neck which revealed the following:  1. The right vertebral artery is not well visualized for the  entirety of its course, both extra cranially and intracranially,  likely occluded although slow flow or severe stenosis can appear  similar. Consider CTA head and neck for further evaluation.  2. Fusiform dilatation of the right cavernous ICA.  3. 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  4. Focal stenosis in the left P1 and left A1. Narrowing is also  noted at the origin of the right P3 branch.  5. The origin of the left vertebral artery and left common carotid  artery are not well visualized, secondary to artifact.".  CXR: "Interval development of small bilateral  pleural effusions with  associated left basilar atelectasis.".      SLP Plan  Discharge SLP treatment due to (comment);Consult other service (comment) (Palliative Care recommended for [place])      Recommendations for follow up therapy are one component of a multi-disciplinary discharge planning process, led by the attending physician.  Recommendations may be updated based on patient status, additional functional criteria and insurance authorization.    Recommendations  Diet recommendations: Dysphagia 1 (puree);Nectar-thick liquid Liquids provided via: Cup;Straw (monitor) Medication Administration: Crushed with puree Supervision: Staff to assist with self feeding;Full supervision/cueing for compensatory strategies Compensations: Minimize environmental distractions;Slow rate;Small sips/bites;Multiple dry swallows after each bite/sip;Follow solids with liquid Postural Changes and/or Swallow Maneuvers: Out of bed for meals;Seated upright 90 degrees;Upright 30-60 min after meal                General recommendations:  (Palliative Care; Dietician) Oral Care Recommendations: Oral care BID;Oral care before and after PO;Staff/trained caregiver to provide oral care Follow Up Recommendations: Skilled nursing-short term rehab (<3 hours/day) Assistance recommended at discharge: Frequent or constant Supervision/Assistance SLP Visit Diagnosis: Dysphagia, oropharyngeal phase (R13.12) (Cognitive decline impacting awareness of swallowing) Plan: Discharge SLP treatment due to (comment);Consult other service (comment) (Palliative Care recommended for [place])  Orinda Kenner, MS, CCC-SLP Speech Language Pathologist Rehab Services [phone] [facility]  04/02/2021, 12:09 PM

## 2021-04-03 ENCOUNTER — Inpatient Hospital Stay: Payer: Medicare HMO

## 2021-04-03 DIAGNOSIS — J9602 Acute respiratory failure with hypercapnia: Secondary | ICD-10-CM

## 2021-04-03 DIAGNOSIS — J9601 Acute respiratory failure with hypoxia: Secondary | ICD-10-CM

## 2021-04-03 DIAGNOSIS — Z515 Encounter for palliative care: Secondary | ICD-10-CM

## 2021-04-03 DIAGNOSIS — N179 Acute kidney failure, unspecified: Secondary | ICD-10-CM | POA: Diagnosis present

## 2021-04-03 DIAGNOSIS — G9341 Metabolic encephalopathy: Secondary | ICD-10-CM

## 2021-04-03 DIAGNOSIS — J690 Pneumonitis due to inhalation of food and vomit: Secondary | ICD-10-CM | POA: Diagnosis present

## 2021-04-03 LAB — CBC WITH DIFFERENTIAL/PLATELET
Abs Immature Granulocytes: 0.08 10*3/uL — ABNORMAL HIGH (ref 0.00–0.07)
Basophils Absolute: 0 10*3/uL (ref 0.0–0.1)
Basophils Relative: 0 %
Eosinophils Absolute: 0 10*3/uL (ref 0.0–0.5)
Eosinophils Relative: 0 %
HCT: 31.2 % — ABNORMAL LOW (ref 39.0–52.0)
Hemoglobin: 9.7 g/dL — ABNORMAL LOW (ref 13.0–17.0)
Immature Granulocytes: 1 %
Lymphocytes Relative: 5 %
Lymphs Abs: 0.5 10*3/uL — ABNORMAL LOW (ref 0.7–4.0)
MCH: 30.9 pg (ref 26.0–34.0)
MCHC: 31.1 g/dL (ref 30.0–36.0)
MCV: 99.4 fL (ref 80.0–100.0)
Monocytes Absolute: 0.6 10*3/uL (ref 0.1–1.0)
Monocytes Relative: 7 %
Neutro Abs: 7.5 10*3/uL (ref 1.7–7.7)
Neutrophils Relative %: 87 %
Platelets: 189 10*3/uL (ref 150–400)
RBC: 3.14 MIL/uL — ABNORMAL LOW (ref 4.22–5.81)
RDW: 14.9 % (ref 11.5–15.5)
WBC: 8.7 10*3/uL (ref 4.0–10.5)
nRBC: 0 % (ref 0.0–0.2)

## 2021-04-03 LAB — COMPREHENSIVE METABOLIC PANEL
ALT: 63 U/L — ABNORMAL HIGH (ref 0–44)
AST: 38 U/L (ref 15–41)
Albumin: 2.6 g/dL — ABNORMAL LOW (ref 3.5–5.0)
Alkaline Phosphatase: 754 U/L — ABNORMAL HIGH (ref 38–126)
Anion gap: 8 (ref 5–15)
BUN: 21 mg/dL (ref 8–23)
CO2: 28 mmol/L (ref 22–32)
Calcium: 8.2 mg/dL — ABNORMAL LOW (ref 8.9–10.3)
Chloride: 105 mmol/L (ref 98–111)
Creatinine, Ser: 0.92 mg/dL (ref 0.61–1.24)
GFR, Estimated: >60 mL/min (ref >60)
Glucose, Bld: 109 mg/dL — ABNORMAL HIGH (ref 70–99)
Potassium: 3.2 mmol/L — ABNORMAL LOW (ref 3.5–5.1)
Sodium: 141 mmol/L (ref 135–145)
Total Bilirubin: 1.4 mg/dL — ABNORMAL HIGH (ref 0.3–1.2)
Total Protein: 5.8 g/dL — ABNORMAL LOW (ref 6.5–8.1)

## 2021-04-03 LAB — GLUCOSE, CAPILLARY
Glucose-Capillary: 121 mg/dL — ABNORMAL HIGH (ref 70–99)
Glucose-Capillary: 134 mg/dL — ABNORMAL HIGH (ref 70–99)
Glucose-Capillary: 144 mg/dL — ABNORMAL HIGH (ref 70–99)
Glucose-Capillary: 126 mg/dL — ABNORMAL HIGH (ref 70–99)

## 2021-04-03 IMAGING — DX DG CHEST 1V PORT
1 series · 1 of 1 positions shown · non-contrast
Comparison: [DATE]

CLINICAL DATA: Pneumonia

EXAM:
PORTABLE CHEST 1 VIEW

[chest ap]
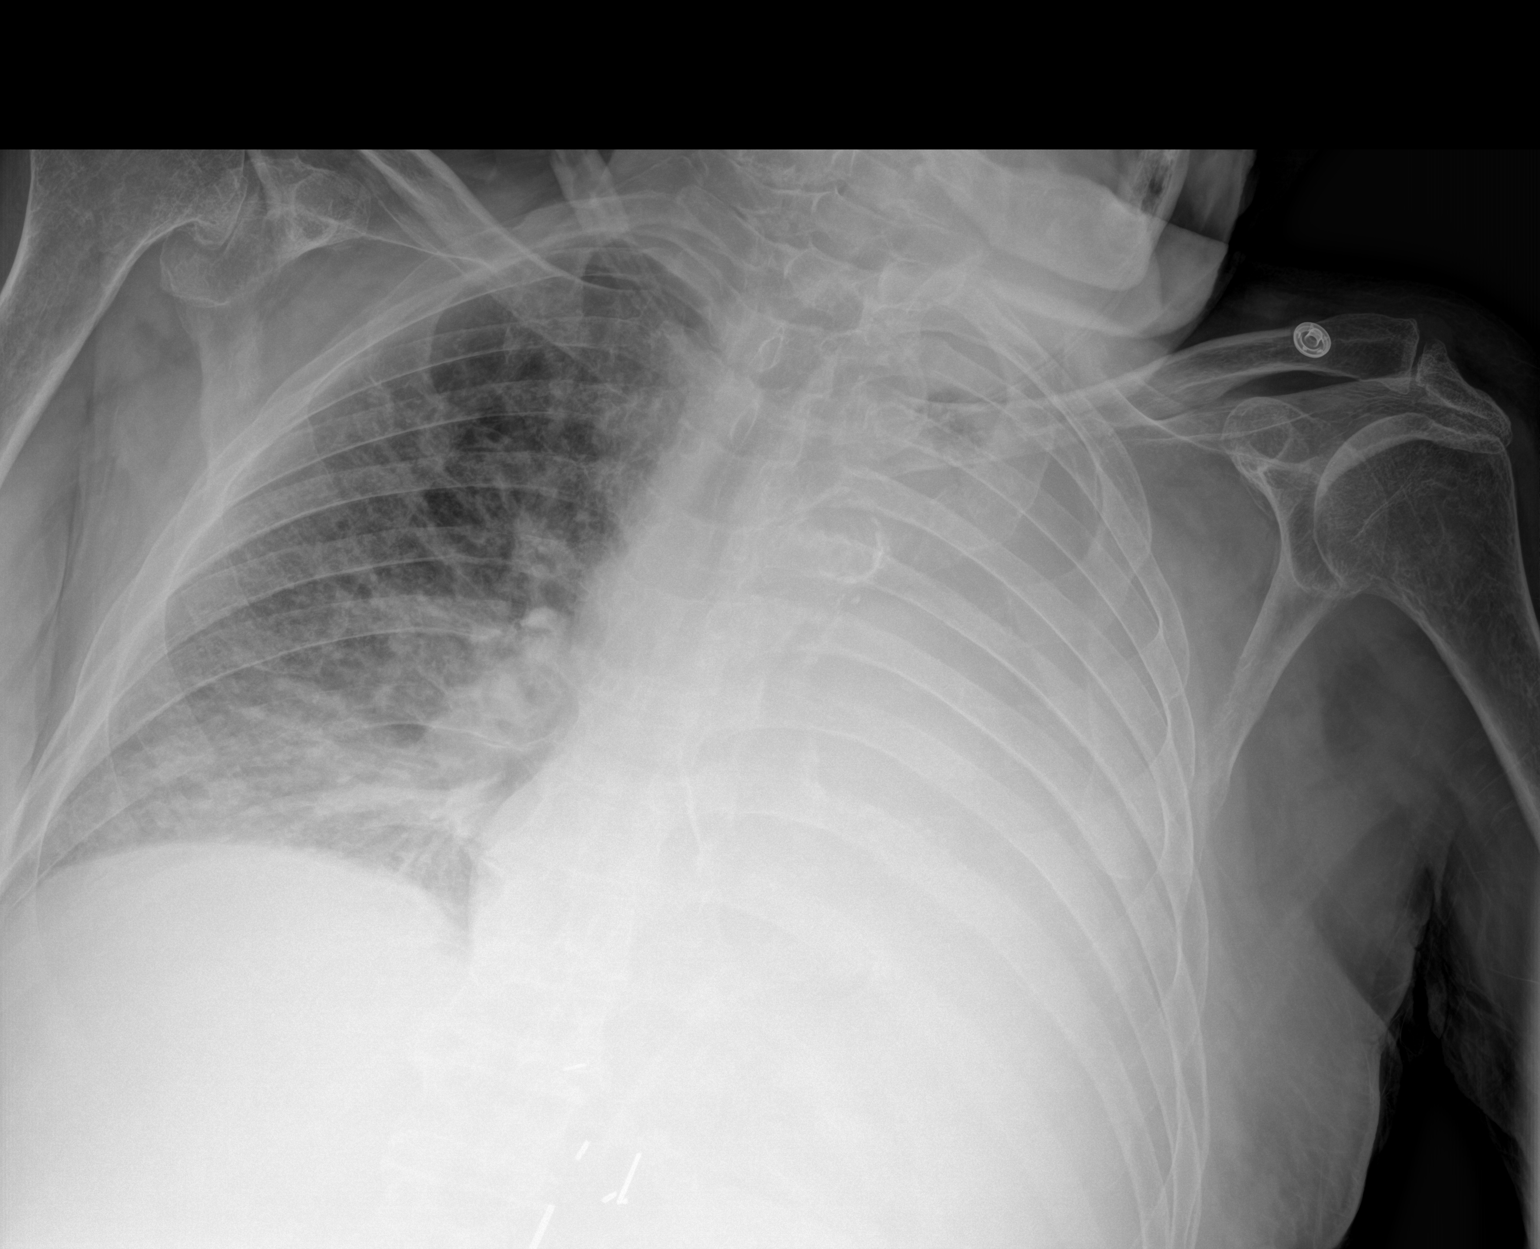

[1 of 1 positions shown; findings below may reference images not displayed]

FINDINGS: Near complete opacification of the left hemithorax, likely worsening
effusion and airspace disease. Worsening airspace disease in the
right lower lobe. Diffuse aortic atherosclerosis. No acute bony
abnormality.
IMPRESSION: Marked worsening of aeration on the left with near complete
opacification, likely worsening effusion and airspace disease.

Worsening right lower lobe consolidation.

## 2021-04-03 MED ORDER — POTASSIUM CHLORIDE CRYS ER 20 MEQ PO TBCR: 40.0000 meq | EXTENDED_RELEASE_TABLET | Freq: Once | ORAL | Status: DC

## 2021-04-03 NOTE — Assessment & Plan Note (Addendum)
Comfort care for now

## 2021-04-03 NOTE — Progress Notes (Signed)
Progress Note   Patient: Matthew Miles. MWU:[id number] DOB: 1928-05-15 DOA: 03/26/2021     7 DOS: the patient was seen and examined on 04/03/2021   Brief hospital course: 87 year old white male known history of COPD,  CAD with CHF, prostate cancer hormone sensitive foll Dr Karolee Stamps on intermittent androgen deprivation therapy since 10/2017--radiation early 2000, Splenic marginal zone lymphoma--lymphoma 2020 status post Rituxan X 4 weeks maintenance Rituxan, Gastrostomy 2013 for bleeding ulcer hemorrhoidectomy in the 19s.  Recent hospitalization [facility] 12 8--03/25/2021--diagnosed with bladder spasm overflow incontinence and urinary tract infection treated with Augmentin as well as antifungals Myrbetriq stopped patient sent home on low-dose trazodone and Oxley.   [facility] to [facility] ED 2-day history of left eyelid ptosis plus generalized malaise worsening left eye vision in addition to hallucinations, Seen by neurology and ophthalmology-ophthalmology follow-up for left internuclear ophthalmoplegia.  12/24: Patient remains lethargic and has lot of oral secretion.  Chest x-ray shows complete opacification of left hemithorax.  Family agreeable to pursue thoracentesis  Assessment and Plan * Third nerve palsy of left eye- (present on admission) Thought to be from vertebral basilar insufficiency seen on MRI.  Outpatient ophthalmology follow-up  AKI (acute kidney injury) ([place])- (present on admission) Resolved now  Encounter for quality of life palliative care I had a long discussion with patient's daughter and son at bedside.  Unfortunately patient is critically sick and high risk for cardiorespiratory failure including death.  He has had significant decline in the last 2-4 weeks.  Family recognizes that but not quite ready to give up.  They might consider hospice if things do not get better.  They do want to give him a chance at least for now but recognizes that he may not do well  and could die.  Aspiration pneumonia of both lower lobes due to gastric secretions ([place])- (present on admission) He is at high risk for recurrent aspiration.  Continue IV Unasyn for now.  Speech therapy recommended dysphagia 1 diet but he has lot of oral secretions and is made n.p.o. as he is very lethargic  Acute respiratory failure with hypoxia and hypercapnia ([place])- (present on admission) Patient is on 6 L HFNC.  Continue Lasix 40 mg IV twice daily.  We will repeat echo to evaluate his heart function.  Consider cardiology consultation.  He does have complete opacification of left hemithorax seen on chest x-ray earlier today.  I have consulted IR for ultrasound-guided thoracentesis Net IO Since Admission: -3,797.52 mL [04/03/21 7253]  Acute metabolic encephalopathy- (present on admission) Likely multifactorial.  Warnicke's versus polypharmacy versus hospital-acquired delirium.  Avoid narcotics or controlled substances.  If no improvement by tomorrow consider repeat CT head and neurology reevaluation tomorrow.  Dr. Linus Mako is aware and will see him tomorrow  Protein-calorie malnutrition, severe- (present on admission) Nutritionist consult   Ophthalmoplegia- (present on admission) Left internuclear ophthalmoplegia likely secondary to vertebrobasilar insufficiency seen on MRI.  Outpatient ophthalmology follow-up  Wernicke encephalopathy- (present on admission) Completed thiamine find a milligram 3 times a day per neurology.  Continue multivitamins.  Normal thiamine and myasthenia gravis panel is negative.  I have reached out to neurology for evaluation  Splenic marginal zone b-cell lymphoma ([place])- (present on admission) Diagnosed in 2020.  Followed by his oncologist  Pressure injury of skin- (present on admission) Continue WOC nurse recommendation  H/O malignant neoplasm of prostate Outpatient follow-up with [facility] urologist  Acute on chronic diastolic CHF (congestive heart failure)  ([place])- (present on admission) Cardiology  consultation.  BNP elevated.  Chest x-ray shows history of bilateral pleural effusion.  Left hemithorax is completely opacified and I have requested IR to drain it under ultrasound guidance.  I have discussed this with Dr. Sandi Mariscal from radiology who has agreed to perform thoracentesis.  Dr. Al Pimple is aware of cardiology consultation.  Will obtain 2D echo.  Continue Lasix 40 mg IV twice daily for now Net IO Since Admission: -3,797.52 mL [04/03/21 1421]  Yeast UTI- (present on admission) Treated with fluconazole on 12/26 and completed  Diabetes ([place]) Continue insulin Levemir 5 units subcu daily, NovoLog 2 units with each meals and sliding scale while in the hospital.  Hemoglobin A1c 5.8  GERD (gastroesophageal reflux disease)- (present on admission) Continue Protonix 40 mg IV daily  Coronary artery disease of native artery of native heart with stable angina pectoris ([place])- (present on admission) Continue Lasix 40 mg IV twice daily for now     Subjective: Patient is obtunded and unable to provide any history.  Son and daughter at bedside  Objective Vital signs were reviewed and significant for hypoxia requiring 6 L HFNC  Ptosis bilaterally--patient is obtunded Cannot assess ocular movements secondary to poor cooperation Neck soft supple On 6 L HFNC oxygen lungs decreased breath sounds bilaterally, rales present  Abdomen: Soft, benign S1-S2 no murmur no rub no gallop Neurologically -difficult to evaluate as he is obtunded.  Nonfocal exam   Data Reviewed: My review of labs, imaging, notes and other tests shows no new significant findings.  Except hypokalemia, hypoalbuminemia and anemia  Family Communication: Updated patient's daughter and son who are at bedside  Disposition: Status is: Inpatient  Remains inpatient appropriate because: Patient is critically sick and high risk for cardiorespiratory failure and death.  He is on  6 L high flow nasal cannula and obtunded   Family does understand that he may not survive this admission if does not improve with all treatment provided  Time spent: 45 minutes  Author: Max Sane 04/03/2021 2:29 PM  For on call review [email or web address].

## 2021-04-03 NOTE — Assessment & Plan Note (Addendum)
Comfort care now Net IO Since Admission: -3,797.52 mL [04/03/21 1421]

## 2021-04-03 NOTE — Assessment & Plan Note (Addendum)
Outpatient follow-up with [facility] urologist.  Comfort care now

## 2021-04-03 NOTE — Assessment & Plan Note (Addendum)
Comfort care now °

## 2021-04-03 NOTE — Assessment & Plan Note (Addendum)
He is at high risk for recurrent aspiration.  Continue IV Unasyn for now.  Comfort care now

## 2021-04-03 NOTE — Assessment & Plan Note (Addendum)
Likely multifactorial.  Comfort care now

## 2021-04-03 NOTE — Assessment & Plan Note (Addendum)
Diagnosed in 2020.  Comfort care now

## 2021-04-03 NOTE — Hospital Course (Addendum)
87 year old white male known history of COPD,  CAD with CHF, prostate cancer hormone sensitive foll Dr Karolee Stamps on intermittent androgen deprivation therapy since 10/2017--radiation early 2000, Splenic marginal zone lymphoma--lymphoma 2020 status post Rituxan X 4 weeks maintenance Rituxan, Gastrostomy 2013 for bleeding ulcer hemorrhoidectomy in the 46s.  Recent hospitalization [facility] 12 8--03/25/2021--diagnosed with bladder spasm overflow incontinence and urinary tract infection treated with Augmentin as well as antifungals Myrbetriq stopped patient sent home on low-dose trazodone and Oxley.   [facility] 2-day history of left eyelid ptosis plus generalized malaise worsening left eye vision in addition to hallucinations, Seen by neurology and ophthalmology-ophthalmology follow-up for left internuclear ophthalmoplegia.  12/24: Patient remains lethargic and has lot of oral secretion.  Chest x-ray shows complete opacification of left hemithorax.  Family agreeable to pursue thoracentesis 12/25: Not enough fluid to drain for Thoracentesis.  Family is now agreeable for comfort care and hospice

## 2021-04-03 NOTE — Assessment & Plan Note (Addendum)
Completed thiamine find a milligram 3 times a day per neurology.  Comfort care now

## 2021-04-03 NOTE — Assessment & Plan Note (Addendum)
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  Comfort care now

## 2021-04-03 NOTE — Assessment & Plan Note (Signed)
Patient is on 6 L HFNC.  Continue Lasix 40 mg IV twice daily.  We will repeat echo to evaluate his heart function.  Consider cardiology consultation.  He does have complete opacification of left hemithorax seen on chest x-ray earlier today.  I have consulted IR for ultrasound-guided thoracentesis Net IO Since Admission: -3,797.52 mL [04/03/21 1417]

## 2021-04-03 NOTE — Assessment & Plan Note (Addendum)
Treated with fluconazole on 12/26 and completed. Comfort care now

## 2021-04-03 NOTE — Assessment & Plan Note (Addendum)
I had a long discussion with patient's daughter and son at bedside.  Unfortunately patient is critically sick and high risk for cardiorespiratory failure including death.  He has had significant decline in the last 2-4 weeks.  Family finally is agreeable today.  I have placed comfort care orders and requested for hospice evaluation.  TOC aware.  Very poor prognosis of less than 2 weeks.  He is not eating/drinking anything and ADL 6 out of 6

## 2021-04-04 ENCOUNTER — Inpatient Hospital Stay (HOSPITAL_COMMUNITY)
Admit: 2021-04-04 | Discharge: 2021-04-04 | Disposition: A | Discharge status: Home / Self Care | Payer: Medicare HMO | Attending: Internal Medicine | Admitting: Internal Medicine

## 2021-04-04 ENCOUNTER — Inpatient Hospital Stay: Payer: Medicare HMO

## 2021-04-04 DIAGNOSIS — R0609 Other forms of dyspnea: Secondary | ICD-10-CM

## 2021-04-04 DIAGNOSIS — Z515 Encounter for palliative care: Secondary | ICD-10-CM

## 2021-04-04 DIAGNOSIS — I5033 Acute on chronic diastolic (congestive) heart failure: Secondary | ICD-10-CM

## 2021-04-04 LAB — CBC
HCT: 31.7 % — ABNORMAL LOW (ref 39.0–52.0)
Hemoglobin: 9.8 g/dL — ABNORMAL LOW (ref 13.0–17.0)
MCH: 31 pg (ref 26.0–34.0)
MCHC: 30.9 g/dL (ref 30.0–36.0)
MCV: 100.3 fL — ABNORMAL HIGH (ref 80.0–100.0)
Platelets: 209 10*3/uL (ref 150–400)
RBC: 3.16 MIL/uL — ABNORMAL LOW (ref 4.22–5.81)
RDW: 14.7 % (ref 11.5–15.5)
WBC: 6.3 10*3/uL (ref 4.0–10.5)
nRBC: 0 % (ref 0.0–0.2)

## 2021-04-04 LAB — BASIC METABOLIC PANEL
Anion gap: 11 (ref 5–15)
BUN: 22 mg/dL (ref 8–23)
CO2: 31 mmol/L (ref 22–32)
Calcium: 8.5 mg/dL — ABNORMAL LOW (ref 8.9–10.3)
Chloride: 103 mmol/L (ref 98–111)
Creatinine, Ser: 1.08 mg/dL (ref 0.61–1.24)
GFR, Estimated: >60 mL/min (ref >60)
Glucose, Bld: 145 mg/dL — ABNORMAL HIGH (ref 70–99)
Potassium: 2.9 mmol/L — ABNORMAL LOW (ref 3.5–5.1)
Sodium: 145 mmol/L (ref 135–145)

## 2021-04-04 LAB — GLUCOSE, CAPILLARY
Glucose-Capillary: 161 mg/dL — ABNORMAL HIGH (ref 70–99)
Glucose-Capillary: 160 mg/dL — ABNORMAL HIGH (ref 70–99)
Glucose-Capillary: 139 mg/dL — ABNORMAL HIGH (ref 70–99)

## 2021-04-04 LAB — ECHOCARDIOGRAM COMPLETE
AR max vel: 2.6 cm2
AV Peak grad: 9.5 mmHg
Ao pk vel: 1.54 m/s
Area-P 1/2: 6.37 cm2
Height: 64 in
S' Lateral: 2.9 cm
Weight: 2704 oz

## 2021-04-04 IMAGING — US US CHEST/MEDIASTINUM
1 series · 6 of 6 positions shown · non-contrast
Comparison: None.

CLINICAL DATA: Dyspnea

EXAM:
ULTRASOUND OF CHEST SOFT TISSUES
TECHNIQUE: Ultrasound examination of the chest wall soft tissues was performed
in the area of clinical concern.

[Series 1: us chest/mediastinum · 0.25mm/px · 6 acquisitions, 6 frames shown]
[im 1/6]
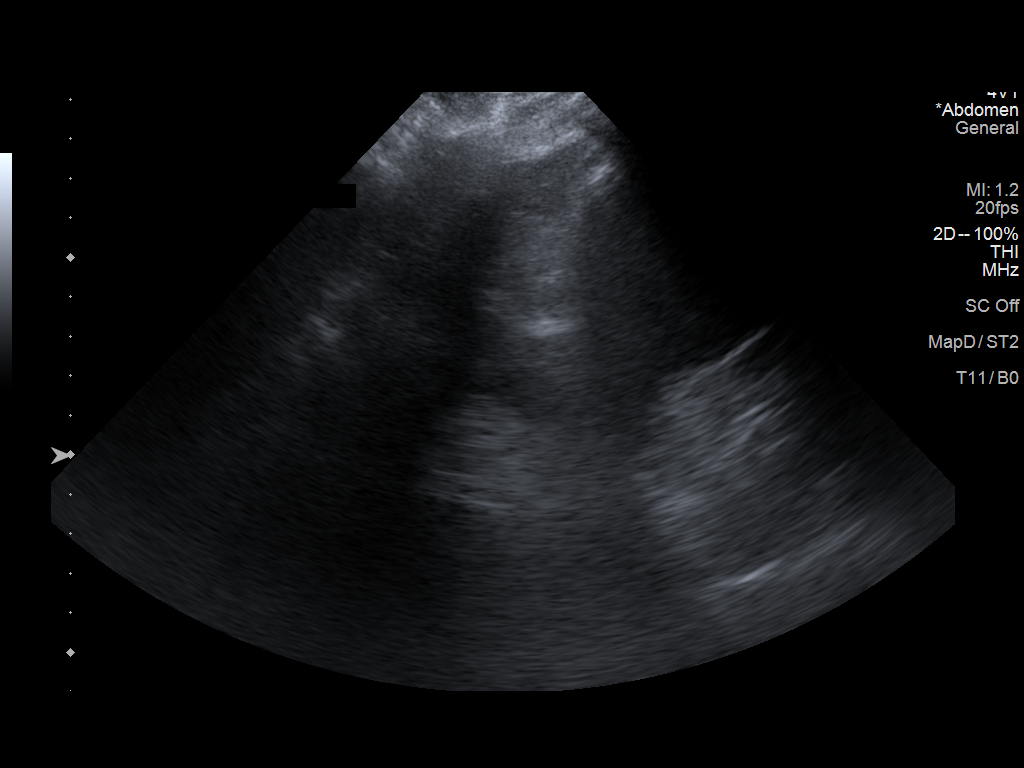
[im 2/6]
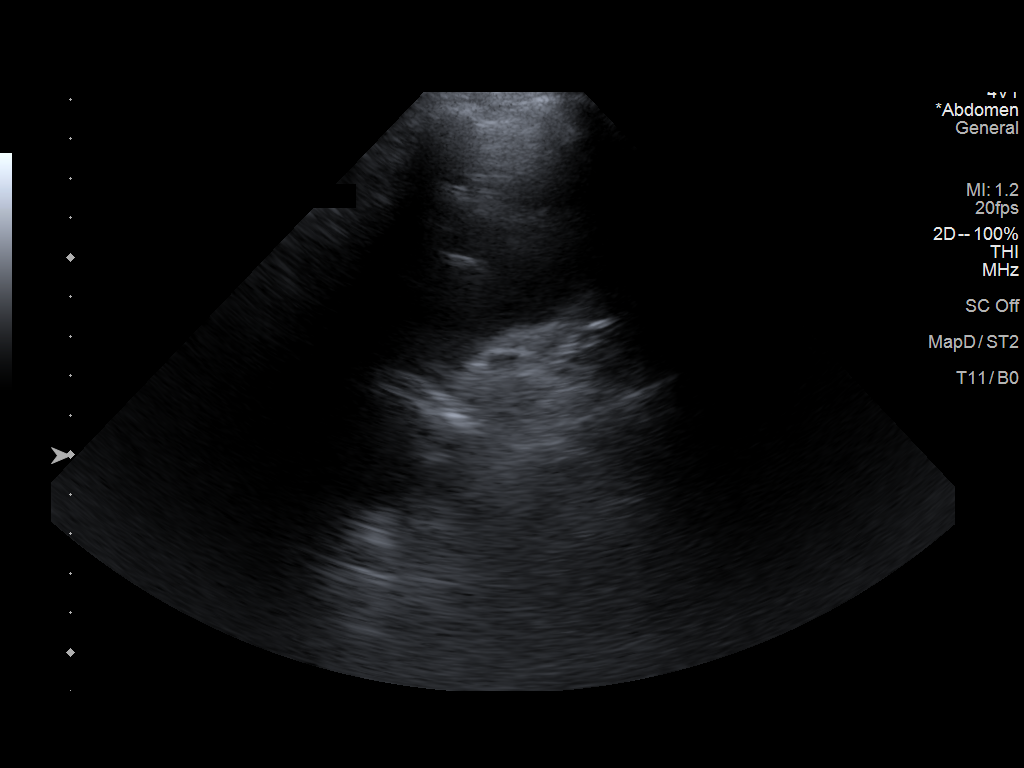
[im 3/6]
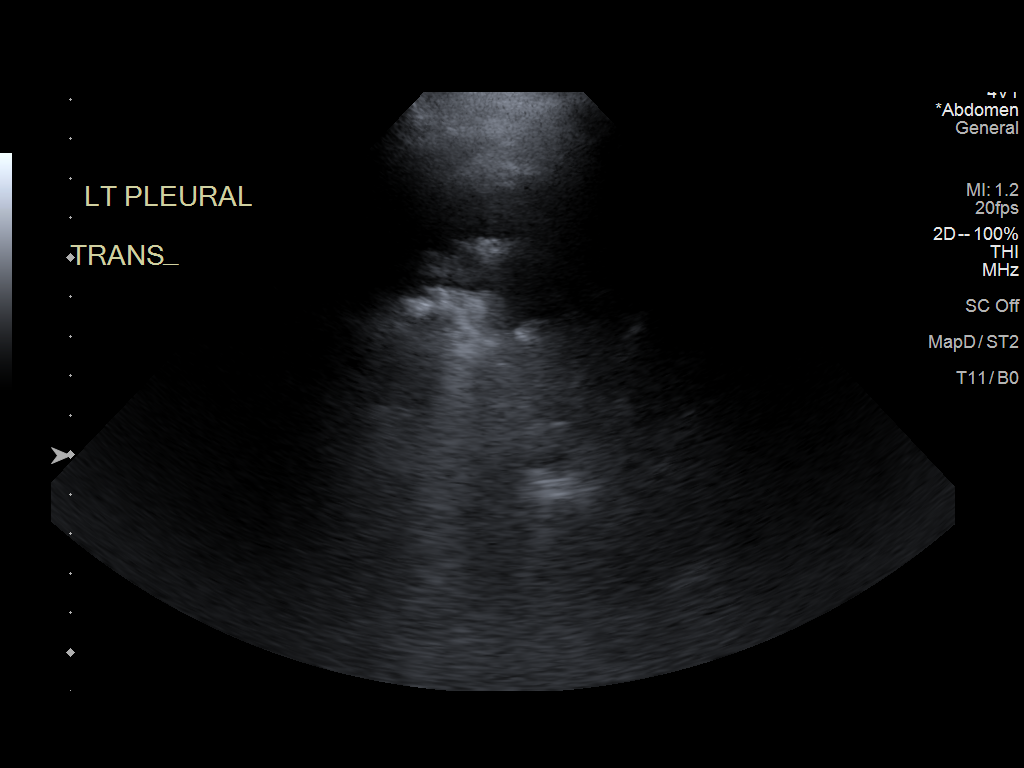
[im 4/6]
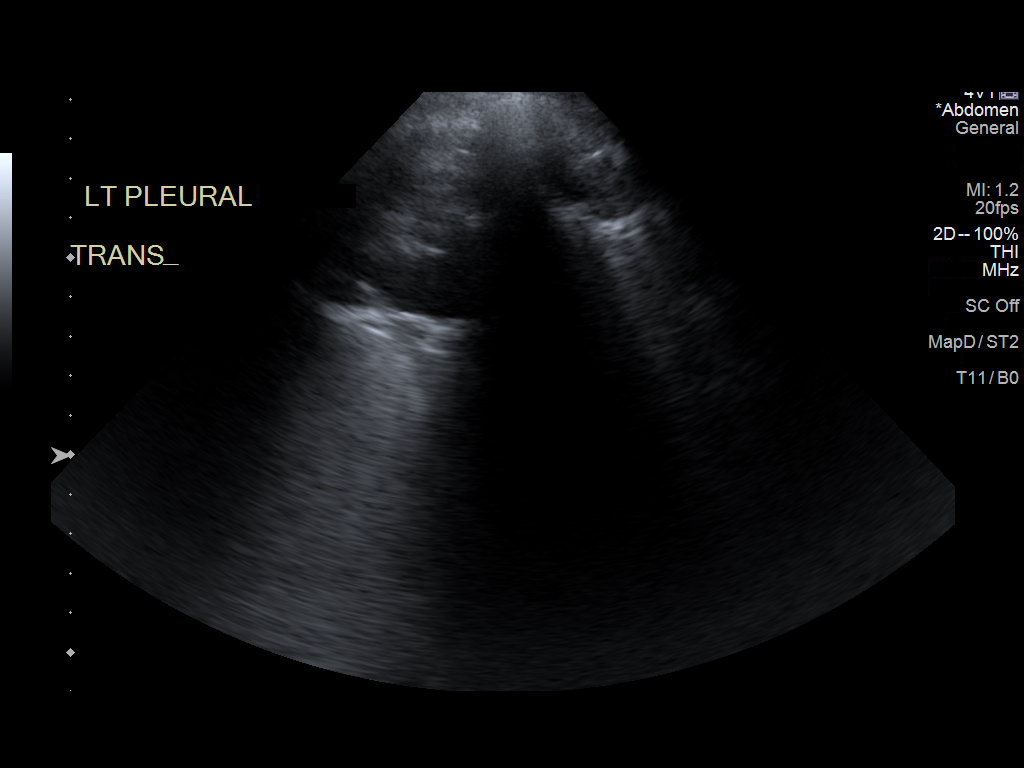
[im 5/6]
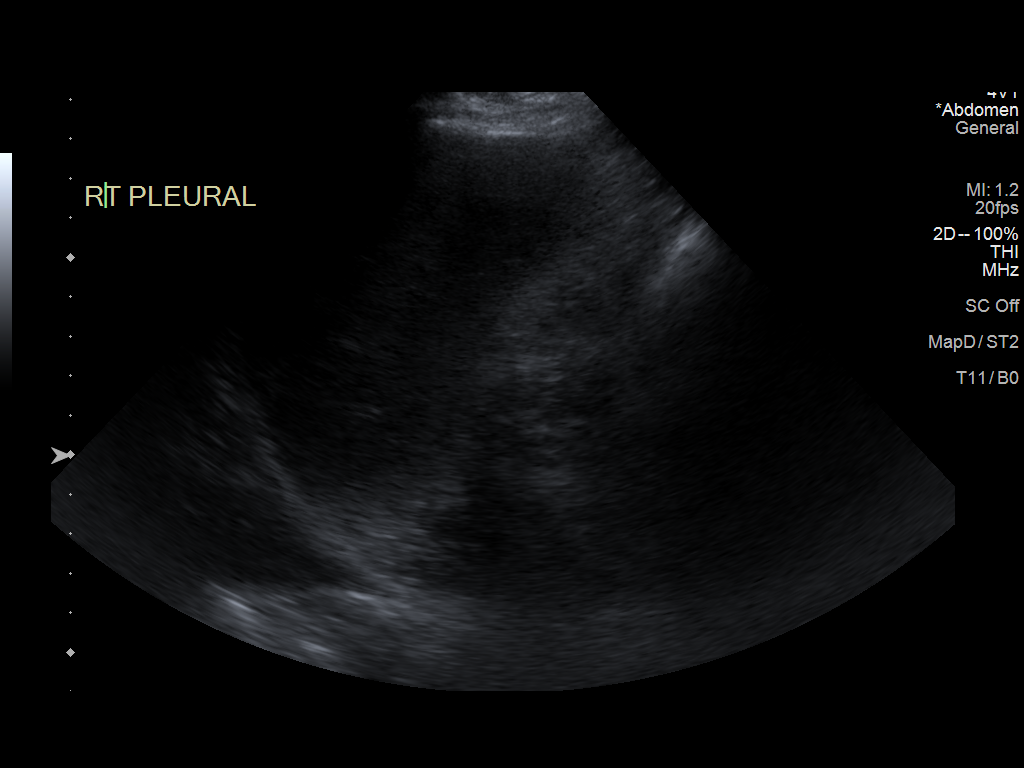
[im 6/6]
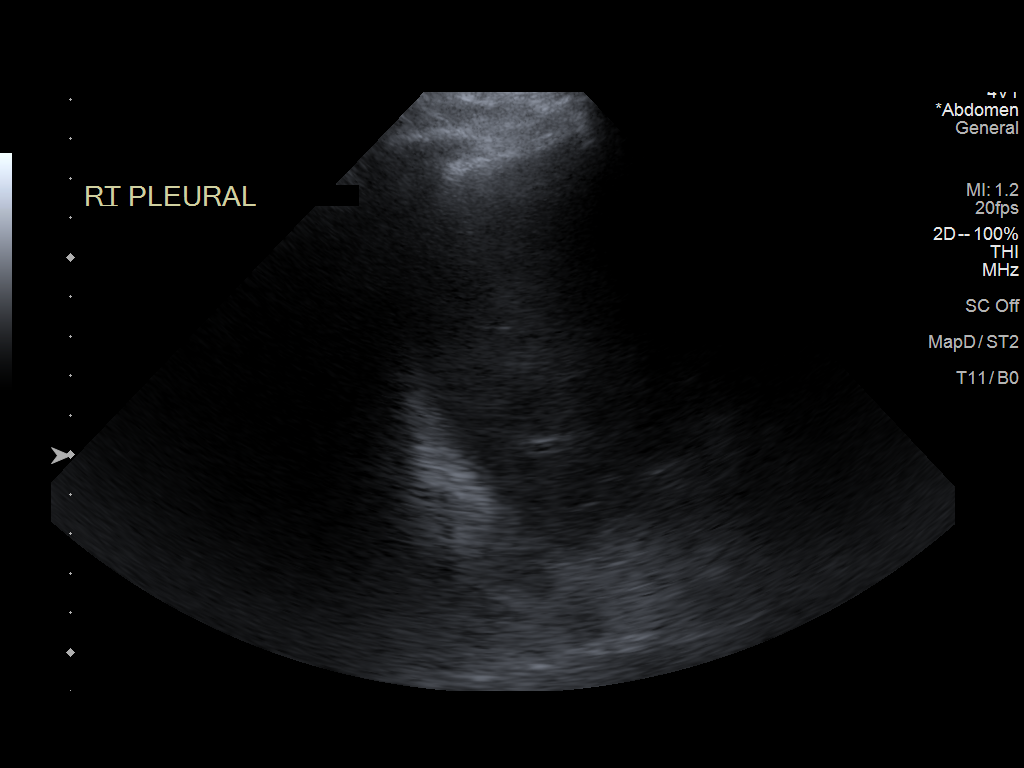

[6 of 6 positions shown; findings below may reference images not displayed]

FINDINGS: Ultrasound performed of the posterior chest bilaterally. Trace left
effusion noted and left lower lobe collapse/consolidation. There is
not enough fluid to warrant therapeutic thoracentesis. No
significant right effusion.
IMPRESSION: Trace left effusion by ultrasound.

## 2021-04-04 MED ORDER — GLYCOPYRROLATE 1 MG PO TABS
1.0000 mg | ORAL_TABLET | ORAL | Status: DC | PRN
  Filled 2021-04-04: qty 1

## 2021-04-04 MED ORDER — DIPHENHYDRAMINE HCL 50 MG/ML IJ SOLN: 25.0000 mg | INTRAMUSCULAR | Status: DC | PRN

## 2021-04-04 MED ORDER — HALOPERIDOL LACTATE 5 MG/ML IJ SOLN: 2.5000 mg | INTRAMUSCULAR | Status: DC | PRN

## 2021-04-04 MED ORDER — BUDESONIDE 0.5 MG/2ML IN SUSP
0.5000 mg | Freq: Two times a day (BID) | RESPIRATORY_TRACT | Status: DC
  Administered 2021-04-04: 11:00:00 0.5 mg via RESPIRATORY_TRACT
  Filled 2021-04-04: qty 2

## 2021-04-04 MED ORDER — SCOPOLAMINE 1 MG/3DAYS TD PT72
1.0000 | MEDICATED_PATCH | TRANSDERMAL | Status: DC
  Administered 2021-04-04: 12:00:00 1.5 mg via TRANSDERMAL
  Filled 2021-04-04: qty 1

## 2021-04-04 MED ORDER — ALBUTEROL SULFATE (2.5 MG/3ML) 0.083% IN NEBU
2.5000 mg | INHALATION_SOLUTION | RESPIRATORY_TRACT | Status: DC
  Administered 2021-04-04: 11:00:00 2.5 mg via RESPIRATORY_TRACT
  Filled 2021-04-04: qty 3

## 2021-04-04 MED ORDER — GLYCOPYRROLATE 0.2 MG/ML IJ SOLN: 0.2000 mg | INTRAMUSCULAR | Status: DC | PRN

## 2021-04-04 MED ORDER — MORPHINE SULFATE (PF) 2 MG/ML IV SOLN: 1.0000 mg | INTRAVENOUS | Status: DC | PRN

## 2021-04-04 MED ORDER — POLYVINYL ALCOHOL 1.4 % OP SOLN
1.0000 [drp] | Freq: Four times a day (QID) | OPHTHALMIC | Status: DC | PRN
  Filled 2021-04-04: qty 15

## 2021-04-04 MED ORDER — LORAZEPAM 2 MG/ML IJ SOLN: 1.0000 mg | INTRAMUSCULAR | Status: DC | PRN

## 2021-04-04 MED ORDER — ACETYLCYSTEINE 20 % IN SOLN
3.0000 mL | Freq: Two times a day (BID) | RESPIRATORY_TRACT | Status: DC
  Administered 2021-04-04: 11:00:00 3 mL via RESPIRATORY_TRACT
  Filled 2021-04-04: qty 4

## 2021-04-04 NOTE — Assessment & Plan Note (Signed)
Comfort care  °

## 2021-04-04 NOTE — Progress Notes (Signed)
Met with son and daughter bedside. I have provided support to them previously. No needs expressed at this time except for prayer which was provided.

## 2021-04-04 NOTE — Assessment & Plan Note (Signed)
Comfort care now °

## 2021-04-04 NOTE — Progress Notes (Signed)
Request to IR for left thoracentesis -- patient CXR 12/22 shows bilateral pleural effusions L>R with follow up CXR 12/24 showing near complete opacification on the left.   [place] left chest today shows scant amount of pleural fluid which is not amenable to safe thoracentesis, consolidated lung is also noted.   Given US findings today and near white out on CXR within ~36 hours this is most likely caused by mucus plugging.  No procedure performed today, primary team aware. [place] images available for review under imaging section of Epic.  Please call with questions or concerns.  Candiss Norse, PA-C

## 2021-04-04 NOTE — Progress Notes (Signed)
I was contacted by Dr. Manuella Ghazi and told that plan is for hospice. I read his echo, see results. Cardiology consult was cancelled per Dr. Manuella Ghazi.  Buford Dresser, MD, PhD, [facility] at [facility] at [facility] [address] [place], [place] [address] [phone]

## 2021-04-04 NOTE — Consult Note (Signed)
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  Patient had been at SNF for 1 day when he was admitted to [facility] for issues with acutely left eye ptosis, generalized weakness and malaise.  Patient was also noted to have visual hallucinations.  Patient cannot provide history but by review of records it appears that he has been having issues with poor cough mechanics and clearing of secretions.  He had evaluations by ophthalmology and by neurology and it appears that his eye ptosis was related to ophthalmic paresis of uncertain etiology.  He received thiamine supplementation for suspicion of Wernicke's encephalopathy likely resulting from malnutrition.  The patient has remained with waxing and waning mental status and distant visual hallucinations.  Review of his [facility] records shows that the patient had been having visual issues intermittently during that admission however were deemed to be resolved at that time.  Over the course of his hospitalization here he has become more intermittently lethargic and with more difficulty clearing secretions.  He developed opacification of his left chest suspected pleural effusion however by ultrasound there is not enough fluid to tap this is likely related to mucous plugging/consolidation.  Family does note that the patient has been slowly declining in health over the last several months.  Patient has become more withdrawn declining to eat or drink.  He appears very frail and as noted  has very poor cough mechanics.  In my evaluation appears he is at end-of-life.  Pertinent  Medical History  Splenic marginal zone B-cell lymphoma Prostate cancer Acute metabolic encephalopathy Acute ophthalmoplegia Query Wernicke's (malnutrition) Adult failure to thrive  Interim History / Subjective:  On evaluation the patient is lethargic and has very poor cough mechanics, he is tachypneic very rhonchorous.  Occasionally will wake up and actually smiles at examiner.  He is confused.  Objective   Blood pressure (!) 148/58, pulse (!) 104, temperature 98.5 F (36.9 C), temperature source Axillary, resp. rate 16, height _0  (1.626 m), weight 76.7 kg, SpO2 94 %.    FiO2 (%):  [100 %] 100 %   Intake/Output Summary (Last 24 hours) at 04/04/2021 1009 Last data filed at 04/04/2021 0516 Gross per 24 hour  Intake 200 ml  Output 700 ml  Net -500 ml   Filed Weights   03/26/21 1645  Weight: 76.7 kg    Examination: GENERAL: Frail appearing, debilitated gentleman, with very poor cough mechanics, somnolent occasionally will smile at examiner.  Very wet sounding cough. HEAD: Normocephalic, atraumatic.  EYES: Pupils equal, round, reactive to light.  No scleral icterus.  MOUTH: Oral mucosa moist. NECK: Supple. No thyromegaly. Trachea midline. + JVD.  No adenopathy. PULMONARY: Diffuse rhonchi throughout diminished breath sounds on the left. CARDIOVASCULAR: S1 and S2. Regular rate and rhythm.  ABDOMEN: Nondistended, soft, normoactive bowel sounds, nontender. MUSCULOSKELETAL: No joint deformity, no clubbing, no edema.  NEUROLOGIC: Confused.  Occasionally does smile at examiner. SKIN: Intact,warm,dry. PSYCH:  Chest x-ray performed 04/03/2021 showing complain white out of the left lung, edema on the right lung:  Resolved Hospital Problem list  N/A  Assessment & Plan:  Atelectatic right lung versus severe consolidation Very poor cough mechanics Patient not a candidate for  bronchoscopy Recommend chest physiotherapy with vest physiotherapy As needed suctioning by RT Continue antibiotics Bronchodilators for mucociliary clearance Pulmonary hygiene  Severe pulmonary hypertension Impaired/restrictive filling Poor prognosis in this regard Volume overload may be aggravating issues above No therapy available for severity of pulmonary hypertension  Adult failure to thrive Severe protein calorie malnutrition Advanced age and frailty These issues add complexity to his management Issues also add to his poor prognosis  Prostate cancer Splenic marginal zone lymphoma These issues to his poor prognosis  Best Practice (right click and "Reselect all SmartList Selections" daily)   Diet/type: NPO Code Status:  DNR, daughter and son leaning towards comfort measures Last date of multidisciplinary goals of care discussion [12/25]  Labs   CBC: Recent Labs  Lab 03/30/21 0428 03/31/21 0519 04/01/21 0445 04/03/21 0533 04/04/21 0457  WBC 9.9 10.0 12.1* 8.7 6.3  NEUTROABS  --   --   --  7.5  --   HGB 8.7* 9.2* 9.4* 9.7* 9.8*  HCT 27.3* 28.9* 29.7* 31.2* 31.7*  MCV 97.5 97.6 97.1 99.4 100.3*  PLT 120* 126* 148* 189 941    Basic Metabolic Panel: Recent Labs  Lab 03/31/21 0519 04/01/21 0445 04/02/21 0806 04/03/21 0533 04/04/21 0457  NA 136 135 140 141 145  K 4.0 4.1 4.1 3.2* 2.9*  CL 103 102 107 105 103  CO2 _0 GLUCOSE 287* 280* 133* 109* 145*  BUN 33* 27* _1 CREATININE 1.18 0.92 1.06 0.92 1.08  CALCIUM 8.2* 8.3* 8.3* 8.2* 8.5*   GFR: Estimated Creatinine Clearance: 42.6 mL/min (by C-G formula based on SCr of 1.08 mg/dL). Recent Labs  Lab 03/31/21 0519 04/01/21 0445 04/01/21 1450 04/03/21 0533 04/04/21 0457  WBC 10.0 12.1*  --  8.7 6.3  LATICACIDVEN  --   --  1.5  --   --     Liver Function Tests: Recent Labs  Lab 04/03/21 0533  AST 38  ALT 63*  ALKPHOS 754*  BILITOT 1.4*  PROT 5.8*  ALBUMIN 2.6*   No results  for input(s): LIPASE, AMYLASE in the last 168 hours. No results for input(s): AMMONIA in the last 168 hours.  ABG    Component Value Date/Time   PHART 7.403 05/25/2011 1105   PCO2ART 37.1 05/25/2011 1105   PO2ART 57.0 (L) 05/25/2011 1105   HCO3 23.4 05/25/2011 1111   TCO2 25 05/25/2011 1111   ACIDBASEDEF 2.0 05/25/2011 1111   O2SAT 61.0 05/25/2011 1111     Coagulation Profile: No results for input(s): INR, PROTIME in the last 168 hours.  Cardiac Enzymes: No results for input(s): CKTOTAL, CKMB, CKMBINDEX, TROPONINI in the last 168 hours.  HbA1C: Hgb A1c MFr Bld  Date/Time Value Ref Range Status  03/27/2021 05:17 AM 5.8 (H) 4.8 - 5.6 % Final    Comment:    (NOTE)         Prediabetes: 5.7 - 6.4         Diabetes: >6.4         Glycemic control for adults with diabetes: <7.0   02/15/2021 11:40 AM 6.7 (H) 4.6 - 6.5 % Final    Comment:    Glycemic Control Guidelines for People with Diabetes:Non Diabetic:  <6%Goal of Therapy: <7%Additional Action Suggested:  >8%     CBG: Recent Labs  Lab 04/03/21 0758 04/03/21 1321 04/03/21 1637  04/03/21 2020 04/04/21 0840  GLUCAP 121* 134* 144* 126* 161*    2D echocardiogram: IMPRESSIONS:  1. While diastolic parameters technically indeterminate, E/e' suggests  very elevated LVEDP, and TR and LA suggest restrictive filling. Left  ventricular ejection fraction, by estimation, is 55 to 60%. The left  ventricle has normal function. The left  ventricle has no regional wall motion abnormalities. There is mild  concentric left ventricular hypertrophy. Indeterminate diastolic filling  due to E-A fusion. Elevated left ventricular end-diastolic pressure.   2. Right ventricular systolic function is normal. The right ventricular  size is moderately enlarged. There is severely elevated pulmonary artery  systolic pressure. The estimated right ventricular systolic pressure is  69.6 mmHg.   3. Left atrial size was severely dilated.   4. The  mitral valve is grossly normal. Mild mitral valve regurgitation.  No evidence of mitral stenosis. Moderate to severe mitral annular  calcification.   5. Tricuspid valve regurgitation is moderate.   6. The aortic valve is tricuspid. Aortic valve regurgitation is trivial.  No aortic stenosis is present.   7. The inferior vena cava is normal in size with <50% respiratory  variability, suggesting right atrial pressure of 8 mmHg.  Comparison(s): Echo 2015: EF is similar, but filling pressures higher on  current study.  Conclusion(s)/Recommendation(s): Severe pulmonary hypertension with  moderately enlarged RV. Elevated LVEDP, likely restrictive filling.  Review of Systems:   Unable to obtain review of systems due to patient's mental status alteration.  Past Medical History:  He,  has a past medical history of Allergy, Arthritis, CHF (congestive heart failure) ([place]), Chicken pox, Cholecystitis, Colon polyps, COPD (chronic obstructive pulmonary disease) ([place]), Coronary artery disease, Diabetes mellitus without complication ([place]), Emphysema of lung ([place]), GERD (gastroesophageal reflux disease), Heart murmur, Hematemesis/vomiting blood (04/10/11), Hypercholesterolemia, Mild hypertension, Pancreatitis, Prostate cancer ([place]), Prostate cancer ([place]), Smoker, Ulcer, and Upper GI bleed (1980).   Surgical History:   Past Surgical History:  Procedure Laterality Date   APPENDECTOMY     CARDIAC CATHETERIZATION  May 2002 and Feb 2013   [place]; no stents    CATARACT EXTRACTION     CHOLECYSTECTOMY     CIRCUMCISION     COLONOSCOPY     HEMORRHOID SURGERY     SP CHOLECYSTOMY     STOMACH SURGERY     bleeding ulcers, followed by Dr. [name]     Social History:   reports that he quit smoking about 9 years ago. His smoking use included cigarettes. He has a 65.00 pack-year smoking history. He has quit using smokeless tobacco.  His smokeless tobacco use included chew. He reports current alcohol use. He reports  that he does not use drugs.   Family History:  His family history includes Emphysema in his sister; Liver cancer in his brother; Prostate cancer in his brother and father.   Allergies Allergies  Allergen Reactions   Oxybutynin     Dry mouth and constant urination.   Sulfa Antibiotics     GI upset   Tradjenta [Linagliptin] Other (See Comments)    Hair loss     Home Medications  Prior to Admission medications   Medication Sig Start Date End Date Taking? Authorizing Provider  Accu-Chek FastClix Lancets MISC Inject 1 each into the skin in the morning and at bedtime. 02/18/21  Yes [name], MD  ACCU-CHEK GUIDE test strip TEST BLOOD SUGAR TWICE DAILY AS DIRECTED 02/22/21  Yes [name], MD  acetaminophen (TYLENOL) 325 MG tablet Take  650 mg by mouth every 6 (six) hours as needed.   Yes [provider]  alfuzosin (UROXATRAL) 10 MG 24 hr tablet Take 1 tablet (10 mg total) by mouth daily with breakfast. 08/27/19  Yes [name], MD  ascorbic acid (VITAMIN C) 500 MG tablet Take 500 mg by mouth 2 (two) times daily.   Yes [provider]  aspirin 81 MG tablet Take 81 mg by mouth daily.    Yes [provider]  blood glucose meter kit and supplies KIT Dispense based on patient and insurance preference. Use once daily as directed. Dx Code E11.9 02/15/21  Yes [name], MD  Calcium Citrate-Vitamin D (CALCIUM CITRATE + D PO) Take by mouth. 1200 mg of calcium and [phone] units of vitamin D daily   Yes [provider]  carvedilol (COREG) 6.25 MG tablet TAKE 1 TABLET TWICE DAILY Patient taking differently: Take 12.5 mg by mouth 2 (two) times daily with a meal. TAKE 1 TABLET TWICE DAILY 11/09/20  Yes Gollan, Kathlene November, MD  collagenase (SANTYL) ointment Apply 1 application topically daily.   Yes [provider]  ferrous sulfate 325 (65 FE) MG EC tablet Take 325 mg by mouth daily.   Yes [provider]  finasteride  (PROSCAR) 5 MG tablet Take 5 mg by mouth daily.   Yes [provider]  fluconazole (DIFLUCAN) 100 MG tablet Take 100 mg by mouth daily.   Yes [provider]  fluticasone (FLONASE) 50 MCG/ACT nasal spray Place 2 sprays into both nostrils daily.   Yes [provider]  insulin lispro (HUMALOG) 100 UNIT/ML injection Inject 1-4 Units into the skin 3 (three) times daily before meals. Per sliding scale   Yes [provider]  lidocaine (LIDODERM) 5 % Place 1 patch onto the skin daily. Remove & Discard patch within 12 hours or as directed by MD   Yes [provider]  melatonin 3 MG TABS tablet Take 3 mg by mouth at bedtime.   Yes [provider]  metFORMIN (GLUCOPHAGE-XR) 500 MG 24 hr tablet TAKE 2 TABLETS(1000 MG) BY MOUTH TWICE DAILY 09/21/20  Yes [name], MD  Multiple Vitamin (MULTIVITAMIN WITH MINERALS) TABS tablet Take 1 tablet by mouth daily.   Yes [provider]  mupirocin ointment (BACTROBAN) 2 % 1 application 2 (two) times daily as needed.   Yes [provider]  nitroGLYCERIN (NITROSTAT) 0.4 MG SL tablet Place 1 tablet (0.4 mg total) under the tongue every 5 (five) minutes as needed. 11/09/20  Yes Gollan, Kathlene November, MD  oxyCODONE (OXY IR/ROXICODONE) 5 MG immediate release tablet Take 5 mg by mouth every 6 (six) hours as needed for severe pain.   Yes [provider]  pantoprazole (PROTONIX) 20 MG tablet TAKE 1 TABLET EVERY DAY 12/04/20  Yes [name], MD  pravastatin (PRAVACHOL) 40 MG tablet Take 1 tablet (40 mg total) by mouth daily. 11/09/20  Yes Minna Merritts, MD  PROAIR HFA [phone] Base) MCG/ACT inhaler INHALE 2 PUFFS INTO THE LUNGS EVERY 6 HOURS AS NEEDED FOR WHEEZING OR SHORTNESS OF BREATH 10/03/16  Yes [name], MD  Tiotropium Bromide Monohydrate (SPIRIVA RESPIMAT) 2.5 MCG/ACT AERS Inhale 2 puffs into the lungs daily. 04/01/20  Yes [name], MD  torsemide (DEMADEX) 10 MG tablet  TAKE 1 TABLET(10 MG) BY MOUTH TWICE DAILY AS NEEDED Patient taking differently: Take 20 mg by mouth daily. Per [name] 11/09/20  Yes Ida Rogue  J, MD  traZODone (DESYREL) 50 MG tablet Take 50 mg by mouth at bedtime as needed for sleep.   Yes [provider]  TRULICITY 1.5 PZ/9.8KI SOPN INJECT 1.5MG (1 PEN) SUBCUTANEOUSLY EVERY WEEK 12/15/20  Yes [name], MD  valACYclovir (VALTREX) 500 MG tablet Take 500 mg by mouth 2 (two) times daily.   Yes [provider]  zinc sulfate 220 (50 Zn) MG capsule Take 220 mg by mouth daily.   Yes [provider]    Scheduled Meds:  acetylcysteine  3 mL Nebulization BID   albuterol  2.5 mg Nebulization Q4H   alfuzosin  10 mg Oral Q breakfast   budesonide (PULMICORT) nebulizer solution  0.5 mg Nebulization BID   Chlorhexidine Gluconate Cloth  6 each Topical Daily   enoxaparin (LOVENOX) injection  40 mg Subcutaneous Q24H   finasteride  5 mg Oral Daily   fluticasone  2 spray Each Nare Daily   furosemide  40 mg Intravenous BID   insulin aspart  0-6 Units Subcutaneous TID WC   insulin aspart  2 Units Subcutaneous TID WC   insulin detemir  5 Units Subcutaneous Daily   lidocaine  1 patch Transdermal Q24H   mouth rinse  15 mL Mouth Rinse BID   pantoprazole (PROTONIX) IV  40 mg Intravenous Q24H   potassium chloride  40 mEq Oral Once   Continuous Infusions:  ampicillin-sulbactam (UNASYN) IV Stopped (04/04/21 0218)   PRN Meds:.[DISCONTINUED] acetaminophen **OR** [DISCONTINUED] acetaminophen (TYLENOL) oral liquid 160 mg/5 mL **OR** acetaminophen, senna-docusate   Level 4 consult    Discussed with the patient's daughter and son at bedside.  They understand that the patient's prognosis is poor.  There leaning towards comfort measures.  Discussed with Dr. Manuella Ghazi.  Renold Don, MD Advanced Bronchoscopy PCCM [facility]    *This note was dictated using voice recognition software/Dragon.  Despite  best efforts to proofread, errors can occur which can change the meaning. Any transcriptional errors that result from this process are unintentional and may not be fully corrected at the time of dictation.

## 2021-04-04 NOTE — Progress Notes (Signed)
*  PRELIMINARY RESULTS* Echocardiogram 2D Echocardiogram has been performed.  Matthew Miles 04/04/2021, 10:29 AM

## 2021-04-04 NOTE — TOC Progression Note (Signed)
Transition of Care (TOC) - Progression Note    Patient Details  Name: Gwynn Crossley. MRN: [id number] Date of Birth: April 16, 1928  Transition of Care [facility]) CM/SW Contact  Izola Price, RN Phone Number: 04/04/2021, 1:47 PM  Clinical Narrative: Disposition changing to hospice. Hospice referral made via intake line and documents faxed to [phone]. Provider indicates screening for home hospice vs residential hospice. Simmie Davies RN CM      Expected Discharge Plan: Skilled Nursing Facility Barriers to Discharge: Continued Medical Work up  Expected Discharge Plan and Services Expected Discharge Plan: [place] In-house Referral: Clinical Social Work   Post Acute Care Choice: [place] Living arrangements for the past 2 months: [place]                                       Social Determinants of Health (SDOH) Interventions    Readmission Risk Interventions No flowsheet data found.

## 2021-04-04 NOTE — Progress Notes (Signed)
Progress Note   Patient: Matthew Miles. RSW:[id number] DOB: 1928/07/01 DOA: 03/26/2021     8 DOS: the patient was seen and examined on 04/04/2021   Brief hospital course: 87 year old white male known history of COPD,  CAD with CHF, prostate cancer hormone sensitive foll Dr Karolee Stamps on intermittent androgen deprivation therapy since 10/2017--radiation early 2000, Splenic marginal zone lymphoma--lymphoma 2020 status post Rituxan X 4 weeks maintenance Rituxan, Gastrostomy 2013 for bleeding ulcer hemorrhoidectomy in the 49s.  Recent hospitalization [facility] 12 8--03/25/2021--diagnosed with bladder spasm overflow incontinence and urinary tract infection treated with Augmentin as well as antifungals Myrbetriq stopped patient sent home on low-dose trazodone and [place].   [facility] ED 2-day history of left eyelid ptosis plus generalized malaise worsening left eye vision in addition to hallucinations, Seen by neurology and ophthalmology-ophthalmology follow-up for left internuclear ophthalmoplegia.  12/24: Patient remains lethargic and has lot of oral secretion.  Chest x-ray shows complete opacification of left hemithorax.  Family agreeable to pursue thoracentesis 12/25: Not enough fluid to drain for Thoracentesis.  Family is now agreeable for comfort care and hospice  Assessment and Plan * Third nerve palsy of left eye- (present on admission) Comfort care for now  AKI (acute kidney injury) ([place])- (present on admission) Comfort care now  Encounter for quality of life palliative care I had a long discussion with patient's daughter and son at bedside.  Unfortunately patient is critically sick and high risk for cardiorespiratory failure including death.  He has had significant decline in the last 2-4 weeks.  Family finally is agreeable today.  I have placed comfort care orders and requested for hospice evaluation.  TOC aware.  Very poor prognosis of less than 2 weeks.  He is  not eating/drinking anything and ADL 6 out of 6  Aspiration pneumonia of both lower lobes due to gastric secretions ([place])- (present on admission) He is at high risk for recurrent aspiration.  Continue IV Unasyn for now.  Comfort care now  Acute respiratory failure with hypoxia and hypercapnia ([place])- (present on admission) Patient is on 6 L HFNC.  Continue Lasix 40 mg IV twice daily.  We will repeat echo to evaluate his heart function.  Consider cardiology consultation.  He does have complete opacification of left hemithorax seen on chest x-ray earlier today.  I have consulted IR for ultrasound-guided thoracentesis Net IO Since Admission: -3,797.52 mL [04/03/21 0938]  Acute metabolic encephalopathy- (present on admission) Likely multifactorial.  Comfort care now  Protein-calorie malnutrition, severe- (present on admission) Comfort care now  Ophthalmoplegia- (present on admission) Left internuclear ophthalmoplegia likely secondary to vertebrobasilar insufficiency seen on MRI.  Comfort care now  Wernicke encephalopathy- (present on admission) Completed thiamine find a milligram 3 times a day per neurology.  Comfort care now  Splenic marginal zone b-cell lymphoma ([place])- (present on admission) Diagnosed in 2020.  Comfort care now   Pressure injury of skin- (present on admission) Comfort care now  Prostate cancer [facility])- (present on admission) Comfort care now  BPH (benign prostatic hyperplasia)- (present on admission) Comfort care now  Essential hypertension- (present on admission) Comfort care now  H/O malignant neoplasm of prostate Outpatient follow-up with [facility] urologist.  Comfort care now  Acute on chronic diastolic CHF (congestive heart failure) ([place])- (present on admission) Comfort care now Net IO Since Admission: -3,797.52 mL [04/03/21 1421]  Thrombocytopenia (HCC)- (present on admission) Comfort care  Yeast UTI- (present on admission) Treated with fluconazole on  12/26 and completed. Comfort  care now  Hyperlipidemia- (present on admission) Comfort care now  Diabetes [facility]) Comfort care now  GERD (gastroesophageal reflux disease)- (present on admission) Comfort care now  Coronary artery disease of native artery of native heart with stable angina pectoris ([place])- (present on admission) Comfort care now  COPD (chronic obstructive pulmonary disease) ([place])- (present on admission) Comfort care    Subjective: Patient actively dying, he is obtunded  Objective Patient is tachycardic, hypoxic requiring 6 L high flow nasal cannula and is obtunded  Ptosis bilaterally--patient is obtunded He seem to be actively dying.  Neck soft supple On 6 L HFNC oxygen lungs decreased breath sounds bilaterally, rales present  Abdomen: Soft, benign S1-S2 no murmur no rub no gallop Neurologically -difficult to evaluate as he is obtunded.  Nonfocal exam   Data Reviewed: Hypokalemia, anemia  Family Communication: Updated patient's daughter and son at bedside  Disposition: Status is: Inpatient  Remains inpatient appropriate because: Patient is actively dying.  He is not eating or drinking, he is agitated and hallucinating at times.  ADLs 6 out of 6, requiring high flow nasal cannula 6 L, his left hemithorax is completely white out likely due to mucous plugging.  Family is agreeable with comfort care and chose hospice  His prognosis is less than 2 weeks.  I have requested TOC to reach out to hospice to evaluate him for hospice home   Time spent: 45 minutes  Author: Max Sane 04/04/2021 12:25 PM  For on call review [email or web address].

## 2021-04-05 MED ORDER — SCOPOLAMINE 1 MG/3DAYS TD PT72: 1.0000 | MEDICATED_PATCH | TRANSDERMAL | 12 refills | Status: AC

## 2021-04-05 MED ORDER — MORPHINE SULFATE (PF) 2 MG/ML IV SOLN: 1.0000 mg | INTRAVENOUS | 0 refills | Status: AC | PRN

## 2021-04-05 MED ORDER — LIDOCAINE 5 % EX PTCH
1.0000 | MEDICATED_PATCH | CUTANEOUS | 0 refills | Status: AC
Start: 2021-04-05 — End: ?

## 2021-04-05 MED ORDER — POLYVINYL ALCOHOL 1.4 % OP SOLN: 1.0000 [drp] | Freq: Four times a day (QID) | OPHTHALMIC | 0 refills | Status: AC | PRN

## 2021-04-06 ENCOUNTER — Telehealth: Payer: Self-pay | Admitting: Family Medicine

## 2021-04-06 NOTE — Telephone Encounter (Signed)
I am sorry to hear that. Do we have any condolences cards?

## 2021-04-06 NOTE — Progress Notes (Signed)
Pt transferred to hospice care

## 2021-04-06 NOTE — Telephone Encounter (Signed)
Pt daughter Lovey Newcomer called in stating that Pt passed away yesterday on April 08, 2021. Pt called in to cancel all appt.

## 2021-04-11 NOTE — Progress Notes (Signed)
Nutrition Brief Note  Chart reviewed. Pt now transitioning to comfort care.  No further nutrition interventions planned at this time.  Please re-consult as needed.   Izeah Vossler W, RD, LDN, CDCES Registered Dietitian II Certified Diabetes Care and Education Specialist Please refer to [facility] for RD and/or RD on-call/weekend/after hours pager   

## 2021-04-11 NOTE — Care Management Important Message (Signed)
Important Message  Patient Details  Name: Matthew Miles. MRN: [id number] Date of Birth: July 22, 1928   Medicare Important Message Given:  Other (see comment)  Comfort Care measures.  Medicare IM withheld at this time out of respect for patient and family.    Dannette Barbara Apr 13, 2021, 10:36 AM

## 2021-04-11 NOTE — Progress Notes (Signed)
[facility] Liaison Note:  Referral received for family interest in [place]. Hospice Home eligibility has been confirmed. Writer met in the room with patient's son Matthew Miles and daughter Matthew Miles to initiate education regarding hospice services, philosophy, team approach to care and current visitation policy with understanding voiced. Questions answered. Hospice information given. Hospice is able to offer a bed today, family is agreeable to transport this afternoon. Report called to the hospice home, EMS notified for transport at 1:30 today. Hospital care team updated.  Signed out of Facility DNR in place for discharge.  Thank you for the opportunity to be involved in the care of this patient and his family. Flo Shanks BSN, RN, [place] [phone]

## 2021-04-11 NOTE — Discharge Summary (Signed)
Physician Discharge Summary   Patient: Matthew Miles. MRN: [id number] DOB: @DOB   Admit date:     03/26/2021  Discharge date: 05-04-2021  Discharge Physician: Max Sane   PCP: [name], MD   Recommendations at discharge: 1. Hospice/comfort care  Discharge Diagnoses Principal Problem:   Third nerve palsy of left eye Active Problems:   COPD (chronic obstructive pulmonary disease) (HCC)   Coronary artery disease of native artery of native heart with stable angina pectoris (HCC)   GERD (gastroesophageal reflux disease)   Diabetes (HCC)   Hyperlipidemia   Yeast UTI   Thrombocytopenia (HCC)   Acute on chronic diastolic CHF (congestive heart failure) (HCC)   H/O malignant neoplasm of prostate   Essential hypertension   BPH (benign prostatic hyperplasia)   Prostate cancer (HCC)   Pressure injury of skin   Splenic marginal zone b-cell lymphoma ([place])   Wernicke encephalopathy   Ophthalmoplegia   Protein-calorie malnutrition, severe   Acute metabolic encephalopathy   Acute respiratory failure with hypoxia and hypercapnia (HCC)   Aspiration pneumonia of both lower lobes due to gastric secretions ([place])   Encounter for quality of life palliative care   AKI (acute kidney injury) ([place])  Resolved Problems:   * No resolved hospital problems. [facility] Course   88 year old white male known history of COPD,  CAD with CHF, prostate cancer hormone sensitive foll Dr Karolee Stamps on intermittent androgen deprivation therapy since 10/2017--radiation early 2000, Splenic marginal zone lymphoma--lymphoma 2020 status post Rituxan X 4 weeks maintenance Rituxan, Gastrostomy 2013 for bleeding ulcer hemorrhoidectomy in the 82s.  Recent hospitalization [facility] 12 8--03/25/2021--diagnosed with bladder spasm overflow incontinence and urinary tract infection treated with Augmentin as well as antifungals Myrbetriq stopped patient sent home on low-dose trazodone and [place].   [facility] ED 2-day history of left eyelid ptosis plus generalized malaise worsening left eye vision in addition to hallucinations, Seen by neurology and ophthalmology-ophthalmology follow-up for left internuclear ophthalmoplegia.  12/24: Patient remains lethargic and has lot of oral secretion.  Chest x-ray shows complete opacification of left hemithorax.  Family agreeable to pursue thoracentesis 12/25: Not enough fluid to drain for Thoracentesis.  Family is now agreeable for comfort care and hospice  * Third nerve palsy of left eye- (present on admission) Comfort care for now  AKI (acute kidney injury) ([place])- (present on admission) Comfort care now  Encounter for quality of life palliative care I had a long discussion with patient's daughter and son at bedside.  Unfortunately patient is critically sick and high risk for cardiorespiratory failure including death.  He has had significant decline in the last 2-4 weeks.  Family finally is agreeable today.  I have placed comfort care orders and requested for hospice evaluation.  TOC aware.  Very poor prognosis of less than 2 weeks.  He is not eating/drinking anything and ADL 6 out of 6  Aspiration pneumonia of both lower lobes due to gastric secretions ([place])- (present on admission) He is at high risk for recurrent aspiration.  Continue IV Unasyn for now.  Comfort care now  Acute respiratory failure with hypoxia and hypercapnia ([place])- (present on admission) Patient is on 6 L HFNC.  Continue Lasix 40 mg IV twice daily.  We will repeat echo to evaluate his heart function.  Consider cardiology consultation.  He does have complete opacification of left hemithorax seen on chest x-ray earlier today.  I have consulted IR for ultrasound-guided thoracentesis Net IO Since  Admission: -3,797.52 mL [04/03/21 1751]  Acute metabolic encephalopathy- (present on admission) Likely multifactorial.  Comfort care now  Protein-calorie malnutrition, severe-  (present on admission) Comfort care now  Ophthalmoplegia- (present on admission) Left internuclear ophthalmoplegia likely secondary to vertebrobasilar insufficiency seen on MRI.  Comfort care now  Wernicke encephalopathy- (present on admission) Completed thiamine find a milligram 3 times a day per neurology.  Comfort care now  Splenic marginal zone b-cell lymphoma ([place])- (present on admission) Diagnosed in 2020.  Comfort care now   Pressure injury of skin- (present on admission) Comfort care now  Prostate cancer [facility])- (present on admission) Comfort care now  BPH (benign prostatic hyperplasia)- (present on admission) Comfort care now  Essential hypertension- (present on admission) Comfort care now  H/O malignant neoplasm of prostate Outpatient follow-up with [facility] urologist.  Comfort care now  Acute on chronic diastolic CHF (congestive heart failure) ([place])- (present on admission) Comfort care now Net IO Since Admission: -3,797.52 mL [04/03/21 1421]  Thrombocytopenia (HCC)- (present on admission) Comfort care  Yeast UTI- (present on admission) Treated with fluconazole on 12/26 and completed. Comfort care now  Hyperlipidemia- (present on admission) Comfort care now  Diabetes [facility]) Comfort care now  GERD (gastroesophageal reflux disease)- (present on admission) Comfort care now  Coronary artery disease of native artery of native heart with stable angina pectoris ([place])- (present on admission) Comfort care now  COPD (chronic obstructive pulmonary disease) ([place])- (present on admission) Comfort care    He has bed available at Hospice home and is accepted by [facility] care so will be D/C today  Pain control - [facility] Controlled Substance Reporting System database was reviewed. and patient was instructed, not to drive, operate heavy machinery, perform activities at heights, swimming or participation in water activities or provide baby-sitting services while on Pain,  Sleep and Anxiety Medications; until their outpatient Physician has advised to do so again. Also recommended to not to take more than prescribed Pain, Sleep and Anxiety Medications.    Disposition: Hospice care Diet recommendation: NPO or Pleasure feed depending on his mental state  DISCHARGE MEDICATION: Allergies as of April 23, 2021       Reactions   Oxybutynin    Dry mouth and constant urination.   Sulfa Antibiotics    GI upset   Tradjenta [linagliptin] Other (See Comments)   Hair loss        Medication List     STOP taking these medications    Accu-Chek FastClix Lancets Misc   Accu-Chek Guide test strip Generic drug: glucose blood   acetaminophen 325 MG tablet Commonly known as: TYLENOL   alfuzosin 10 MG 24 hr tablet Commonly known as: UROXATRAL   ascorbic acid 500 MG tablet Commonly known as: VITAMIN C   aspirin 81 MG tablet   blood glucose meter kit and supplies Kit   CALCIUM CITRATE + D PO   carvedilol 6.25 MG tablet Commonly known as: COREG   collagenase ointment Commonly known as: SANTYL   ferrous sulfate 325 (65 FE) MG EC tablet   finasteride 5 MG tablet Commonly known as: PROSCAR   fluconazole 100 MG tablet Commonly known as: DIFLUCAN   fluticasone 50 MCG/ACT nasal spray Commonly known as: FLONASE   insulin lispro 100 UNIT/ML injection Commonly known as: HUMALOG   melatonin 3 MG Tabs tablet   metFORMIN 500 MG 24 hr tablet Commonly known as: GLUCOPHAGE-XR   multivitamin with minerals Tabs tablet   mupirocin ointment 2 % Commonly known as: [employer]  nitroGLYCERIN 0.4 MG SL tablet Commonly known as: Nitrostat   oxyCODONE 5 MG immediate release tablet Commonly known as: Oxy IR/ROXICODONE   pantoprazole 20 MG tablet Commonly known as: PROTONIX   pravastatin 40 MG tablet Commonly known as: PRAVACHOL   ProAir HFA 108 (90 Base) MCG/ACT inhaler Generic drug: albuterol   Spiriva Respimat 2.5 MCG/ACT Aers Generic drug:  Tiotropium Bromide Monohydrate   torsemide 10 MG tablet Commonly known as: DEMADEX   traZODone 50 MG tablet Commonly known as: DESYREL   Trulicity 1.5 GL/8.7FI Sopn Generic drug: Dulaglutide   valACYclovir 500 MG tablet Commonly known as: VALTREX   zinc sulfate 220 (50 Zn) MG capsule       TAKE these medications    lidocaine 5 % Commonly known as: LIDODERM Place 1 patch onto the skin daily. Remove & Discard patch within 12 hours or as directed by MD   morphine 2 MG/ML injection Inject 0.5 mLs (1 mg total) into the vein every hour as needed (severe pain, dyspnea; if not achieving comfort, call MD for infusion orders).   polyvinyl alcohol 1.4 % ophthalmic solution Commonly known as: LIQUIFILM TEARS Place 1 drop into both eyes 4 (four) times daily as needed for dry eyes.   scopolamine 1 MG/3DAYS Commonly known as: TRANSDERM-SCOP Place 1 patch (1.5 mg total) onto the skin every 3 (three) days. Start taking on: April 07, 2021         Discharge Exam: Danley Danker Weights   03/26/21 1645  Weight: 76.7 kg   Patient is tachycardic, hypoxic requiring 6 L high flow nasal cannula and is obtunded   Ptosis bilaterally--patient is obtunded He is actively dying.  Neck soft supple On 6 L HFNC oxygen lungs decreased breath sounds bilaterally, rales present  Abdomen: Soft, benign S1-S2 no murmur no rub no gallop Neurologically -difficult to evaluate as he is obtunded.  Nonfocal exam   Condition at discharge: critical  The results of significant diagnostics from this hospitalization (including imaging, microbiology, ancillary and laboratory) are listed below for reference.   Imaging Studies: DG Chest 2 View  Result Date: 03/26/2021 CLINICAL DATA:  Altered mental status EXAM: CHEST - 2 VIEW COMPARISON:  03/04/2021 FINDINGS: Lung volumes are small. Small bilateral pleural effusions have developed since prior examination with associated left basilar compressive atelectasis. No  pneumothorax. Cardiac size within normal limits. Pulmonary vascularity is normal. No acute bone abnormality. IMPRESSION: Interval development of small bilateral pleural effusions with associated left basilar atelectasis. Electronically Signed   By: Fidela Salisbury M.D.   On: 03/26/2021 23:53   CT Head Wo Contrast  Result Date: 03/26/2021 CLINICAL DATA:  Left eye droop EXAM: CT HEAD WITHOUT CONTRAST TECHNIQUE: Contiguous axial images were obtained from the base of the skull through the vertex without intravenous contrast. COMPARISON:  03/26/2021 FINDINGS: Brain: Mild atrophic changes and chronic white matter ischemic changes are identified. A few small lacunar infarcts are noted within the basal ganglia on the right stable in appearance from the prior exam. No acute hemorrhage or acute infarction is seen. Vascular: No hyperdense vessel or unexpected calcification. Skull: Normal. Negative for fracture or focal lesion. Sinuses/Orbits: No acute finding. Other: None. IMPRESSION: Chronic atrophic and ischemic changes without acute abnormality. Electronically Signed   By: Inez Catalina M.D.   On: 03/26/2021 21:21   CT HEAD WO CONTRAST (5MM)  Result Date: 03/07/2021 CLINICAL DATA:  Headache EXAM: CT HEAD WITHOUT CONTRAST TECHNIQUE: Contiguous axial images were obtained from the base of the skull through  the vertex without intravenous contrast. COMPARISON:  None. BRAIN: BRAIN Cerebral ventricle sizes are concordant with the degree of cerebral volume loss. Patchy and confluent areas of decreased attenuation are noted throughout the deep and periventricular white matter of the cerebral hemispheres bilaterally, compatible with chronic microvascular ischemic disease. Possible chronic right basal ganglia infarction. No evidence of large-territorial acute infarction. No parenchymal hemorrhage. No mass lesion. No extra-axial collection. No mass effect or midline shift. No hydrocephalus. Basilar cisterns are patent.  Vascular: No hyperdense vessel. Atherosclerotic calcifications are present within the cavernous internal carotid arteries. Skull: No acute fracture or focal lesion. Sinuses/Orbits: Paranasal sinuses and mastoid air cells are clear. Right lens replacement. Otherwise orbits are unremarkable. Other: None. IMPRESSION: No acute intracranial abnormality. Electronically Signed   By: Iven Finn M.D.   On: 03/07/2021 15:14   MR ANGIO HEAD WO CONTRAST  Result Date: 03/27/2021 CLINICAL DATA:  Neuro deficit, stroke suspected, left third nerve palsy, concern for aneurysm EXAM: MRA NECK WITHOUT AND WITH CONTRAST MRA HEAD WITHOUT CONTRAST TECHNIQUE: Multiplanar and multiecho pulse sequences of the neck were obtained without and with intravenous contrast. Angiographic images of the neck were obtained using MRA technique without and with intravenous contrast; Angiographic images of the Circle of Willis were obtained using MRA technique without intravenous contrast. CONTRAST:  36m GADAVIST GADOBUTROL 1 MMOL/ML IV SOLN COMPARISON:  None. FINDINGS: MRA NECK FINDINGS Three-vessel aortic arch. The proximal left common carotid is not well evaluated due to artifact. No evidence of aortic aneurysm or dissection. No evidence of hemodynamically significant stenosis (greater than 50%), dissection, or occlusion in the bilateral common and internal carotid arteries. The right vertebral artery is not well visualized for the entirety of its course, which may indicate slow flow or stenosis. The origin of the left vertebral artery is not well visualized, secondary to artifact. The remainder of the left vertebral artery is patent, without significant stenosis (greater than 50%), dissection, or occlusion. MRA HEAD FINDINGS Both internal carotid arteries are patent to the termini, without stenosis. Somewhat fusiform dilatation of the right cavernous ICA (series 9, image 95). Focal stenosis of the origin of the left A1 (series 9, image 107).  Normal right A1. Anterior cerebral arteries are patent to their distal aspects. No M1 stenosis or occlusion. Normal MCA bifurcations. Distal MCA branches perfused and relatively symmetric. The right vertebral artery is not visualized. The left vertebral artery is patent to the vertebrobasilar junction. The basilar artery demonstrates focal loss of signal (series 9, image 83), likely severe focal stenosis or occlusion. Focal narrowing is also noted in the right P1 segment, with a patent right posterior communicating artery. The left P1 is not visualized. Patent left posterior communicating artery with fetal origin of the left PCA. Focal narrowing at the origin of the right P3 branch (series 9, image 131). PCAs are grossly patent to their distal aspects. IMPRESSION: 1. The right vertebral artery is not well visualized for the entirety of its course, both extra cranially and intracranially, likely occluded although slow flow or severe stenosis can appear similar. Consider CTA head and neck for further evaluation. 2. Fusiform dilatation of the right cavernous ICA. 3. 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 4. Focal stenosis in the left P1 and left A1. Narrowing is also noted at the origin of the right P3 branch. 5. The origin of the left vertebral artery and left common carotid artery are not well visualized, secondary to artifact. Electronically Signed   By:  Merilyn Baba M.D.   On: 03/27/2021 02:19   MR Angiogram Neck W or Wo Contrast  Result Date: 03/27/2021 CLINICAL DATA:  Neuro deficit, stroke suspected, left third nerve palsy, concern for aneurysm EXAM: MRA NECK WITHOUT AND WITH CONTRAST MRA HEAD WITHOUT CONTRAST TECHNIQUE: Multiplanar and multiecho pulse sequences of the neck were obtained without and with intravenous contrast. Angiographic images of the neck were obtained using MRA technique without and with intravenous contrast; Angiographic images of the Circle of  Willis were obtained using MRA technique without intravenous contrast. CONTRAST:  70m GADAVIST GADOBUTROL 1 MMOL/ML IV SOLN COMPARISON:  None. FINDINGS: MRA NECK FINDINGS Three-vessel aortic arch. The proximal left common carotid is not well evaluated due to artifact. No evidence of aortic aneurysm or dissection. No evidence of hemodynamically significant stenosis (greater than 50%), dissection, or occlusion in the bilateral common and internal carotid arteries. The right vertebral artery is not well visualized for the entirety of its course, which may indicate slow flow or stenosis. The origin of the left vertebral artery is not well visualized, secondary to artifact. The remainder of the left vertebral artery is patent, without significant stenosis (greater than 50%), dissection, or occlusion. MRA HEAD FINDINGS Both internal carotid arteries are patent to the termini, without stenosis. Somewhat fusiform dilatation of the right cavernous ICA (series 9, image 95). Focal stenosis of the origin of the left A1 (series 9, image 107). Normal right A1. Anterior cerebral arteries are patent to their distal aspects. No M1 stenosis or occlusion. Normal MCA bifurcations. Distal MCA branches perfused and relatively symmetric. The right vertebral artery is not visualized. The left vertebral artery is patent to the vertebrobasilar junction. The basilar artery demonstrates focal loss of signal (series 9, image 83), likely severe focal stenosis or occlusion. Focal narrowing is also noted in the right P1 segment, with a patent right posterior communicating artery. The left P1 is not visualized. Patent left posterior communicating artery with fetal origin of the left PCA. Focal narrowing at the origin of the right P3 branch (series 9, image 131). PCAs are grossly patent to their distal aspects. IMPRESSION: 1. The right vertebral artery is not well visualized for the entirety of its course, both extra cranially and intracranially,  likely occluded although slow flow or severe stenosis can appear similar. Consider CTA head and neck for further evaluation. 2. Fusiform dilatation of the right cavernous ICA. 3. 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 4. Focal stenosis in the left P1 and left A1. Narrowing is also noted at the origin of the right P3 branch. 5. The origin of the left vertebral artery and left common carotid artery are not well visualized, secondary to artifact. Electronically Signed   By: AMerilyn BabaM.D.   On: 03/27/2021 02:19   MR BRAIN WO CONTRAST  Result Date: 03/26/2021 CLINICAL DATA:  Neuro deficit, stroke suspected, loss of vision in left eye EXAM: MRI HEAD WITHOUT CONTRAST TECHNIQUE: Multiplanar, multiecho pulse sequences of the brain and surrounding structures were obtained without intravenous contrast. COMPARISON:  No prior MRI, correlation is made with CT head 03/26/2021 FINDINGS: Brain: No restricted diffusion to suggest acute or subacute infarct. No acute hemorrhage, mass, mass effect, or midline shift. Lacunar infarcts in the right basal ganglia. T2 hyperintense signal in the periventricular white matter, likely the sequela of chronic small vessel ischemic disease. No hydrocephalus or extra-axial collection. Focus of hemosiderin deposition in the right medial temporal lobe, likely sequela of prior hypertensive  microhemorrhage. Vascular: Normal flow voids. Skull and upper cervical spine: Normal marrow signal. Sinuses/Orbits: Negative.  Status post right lens replacement. Other: Fluid in the bilateral mastoid air cells. IMPRESSION: No acute intracranial process. Electronically Signed   By: Merilyn Baba M.D.   On: 03/26/2021 23:50   [place] CHEST (PLEURAL EFFUSION)  Result Date: 04/04/2021 CLINICAL DATA:  Dyspnea EXAM: ULTRASOUND OF CHEST SOFT TISSUES TECHNIQUE: Ultrasound examination of the chest wall soft tissues was performed in the area of clinical concern. COMPARISON:   None. FINDINGS: Ultrasound performed of the posterior chest bilaterally. Trace left effusion noted and left lower lobe collapse/consolidation. There is not enough fluid to warrant therapeutic thoracentesis. No significant right effusion. IMPRESSION: Trace left effusion by ultrasound. Electronically Signed   By: Jerilynn Mages.  Shick M.D.   On: 04/04/2021 10:04   DG Chest Port 1 View  Result Date: 04/03/2021 CLINICAL DATA:  Pneumonia EXAM: PORTABLE CHEST 1 VIEW COMPARISON:  04/01/2021 FINDINGS: Near complete opacification of the left hemithorax, likely worsening effusion and airspace disease. Worsening airspace disease in the right lower lobe. Diffuse aortic atherosclerosis. No acute bony abnormality. IMPRESSION: Marked worsening of aeration on the left with near complete opacification, likely worsening effusion and airspace disease. Worsening right lower lobe consolidation. Electronically Signed   By: Rolm Baptise M.D.   On: 04/03/2021 06:15   DG Chest Port 1 View  Result Date: 04/01/2021 CLINICAL DATA:  Pneumonia. EXAM: PORTABLE CHEST 1 VIEW COMPARISON:  Radiographs 03/26/2021 and 03/04/2021.  CT 04/09/2006. FINDINGS: 1344 hours. Lower lung volumes. The heart size is stable at the upper limits of normal. There are enlarging left greater than right pleural effusions with associated bibasilar pulmonary opacities and increasingly poor definition of the pulmonary vasculature. Findings may reflect pulmonary edema, although pneumonia at the left lung base cannot be excluded. No evidence of pneumothorax. No acute osseous findings are seen. Evidence of a chronic rotator cuff tear on the right. IMPRESSION: Worsening bilateral pleural effusions and bilateral airspace opacities which may reflect congestive heart failure or pneumonia. Radiographic follow up recommended. Electronically Signed   By: Richardean Sale M.D.   On: 04/01/2021 14:01   ECHOCARDIOGRAM COMPLETE  Result Date: 04/04/2021    ECHOCARDIOGRAM REPORT    Patient Name:   Emir Nack. Date of Exam: 04/04/2021 Medical Rec #:  [id number]         Height:       64.0 in Accession #:    [id number]        Weight:       169.0 lb Date of Birth:  1928-12-29         BSA:          1.821 m Patient Age:    16 years          BP:           133/52 mmHg Patient Gender: M                 HR:           99 bpm. Exam Location:  [facility] Procedure: 2D Echo Indications:     Dyspnea R06.00  History:         Patient has no prior history of Echocardiogram examinations.  Sonographer:     Kathlen Brunswick RDCS Referring Phys:  [place] Diagnosing Phys: Buford Dresser MD IMPRESSIONS  1. While diastolic parameters technically indeterminate, E/e' suggests very elevated LVEDP, and TR and LA suggest restrictive filling. Left ventricular ejection fraction, by  estimation, is 55 to 60%. The left ventricle has normal function. The left ventricle has no regional wall motion abnormalities. There is mild concentric left ventricular hypertrophy. Indeterminate diastolic filling due to E-A fusion. Elevated left ventricular end-diastolic pressure.  2. Right ventricular systolic function is normal. The right ventricular size is moderately enlarged. There is severely elevated pulmonary artery systolic pressure. The estimated right ventricular systolic pressure is 89.3 mmHg.  3. Left atrial size was severely dilated.  4. The mitral valve is grossly normal. Mild mitral valve regurgitation. No evidence of mitral stenosis. Moderate to severe mitral annular calcification.  5. Tricuspid valve regurgitation is moderate.  6. The aortic valve is tricuspid. Aortic valve regurgitation is trivial. No aortic stenosis is present.  7. The inferior vena cava is normal in size with <50% respiratory variability, suggesting right atrial pressure of 8 mmHg. Comparison(s): Echo 2015: EF is similar, but filling pressures higher on current study. Conclusion(s)/Recommendation(s): Severe pulmonary hypertension with  moderately enlarged RV. Elevated LVEDP, likely restrictive filling. FINDINGS  Left Ventricle: While diastolic parameters technically indeterminate, E/e' suggests very elevated LVEDP, and TR and LA suggest restrictive filling. Left ventricular ejection fraction, by estimation, is 55 to 60%. The left ventricle has normal function. The left ventricle has no regional wall motion abnormalities. The left ventricular internal cavity size was normal in size. There is mild concentric left ventricular hypertrophy. Indeterminate diastolic filling due to E-A fusion. Elevated left ventricular end-diastolic pressure. The E/e' is 76. Right Ventricle: The right ventricular size is moderately enlarged. Right vetricular wall thickness was not well visualized. Right ventricular systolic function is normal. There is severely elevated pulmonary artery systolic pressure. The tricuspid regurgitant velocity is 4.10 m/s, and with an assumed right atrial pressure of 8 mmHg, the estimated right ventricular systolic pressure is 81.0 mmHg. Left Atrium: Left atrial size was severely dilated. Right Atrium: Right atrial size was normal in size. Pericardium: There is no evidence of pericardial effusion. Presence of epicardial fat layer. Mitral Valve: The mitral valve is grossly normal. Moderate to severe mitral annular calcification. Mild mitral valve regurgitation. No evidence of mitral valve stenosis. Tricuspid Valve: The tricuspid valve is grossly normal. Tricuspid valve regurgitation is moderate. Aortic Valve: The aortic valve is tricuspid. Aortic valve regurgitation is trivial. No aortic stenosis is present. Aortic valve peak gradient measures 9.5 mmHg. Pulmonic Valve: The pulmonic valve was not well visualized. Pulmonic valve regurgitation is not visualized. No evidence of pulmonic stenosis. Aorta: The aortic root and ascending aorta are structurally normal, with no evidence of dilitation. Venous: The inferior vena cava is normal in size with  less than 50% respiratory variability, suggesting right atrial pressure of 8 mmHg. IAS/Shunts: The atrial septum is grossly normal.  LEFT VENTRICLE PLAX 2D LVIDd:         4.10 cm   Diastology LVIDs:         2.90 cm   LV e' medial:    4.57 cm/s LV PW:         1.20 cm   LV E/e' medial:  30.0 LV IVS:        1.10 cm   LV e' lateral:   3.48 cm/s LVOT diam:     1.90 cm   LV E/e' lateral: 39.4 LV SV:         89 LV SV Index:   49 LVOT Area:     2.84 cm  RIGHT VENTRICLE RV Basal diam:  4.07 cm RV S prime:  16.00 cm/s TAPSE (M-mode): 2.3 cm LEFT ATRIUM           Index        RIGHT ATRIUM           Index LA diam:      4.70 cm 2.58 cm/m   RA Area:     10.80 cm LA Vol (A2C): 22.9 ml 12.57 ml/m  RA Volume:   21.10 ml  11.59 ml/m LA Vol (A4C): 98.5 ml 54.08 ml/m  AORTIC VALVE                 PULMONIC VALVE AV Area (Vmax): 2.60 cm     PV Vmax:       1.30 m/s AV Vmax:        154.00 cm/s  PV Peak grad:  6.8 mmHg AV Peak Grad:   9.5 mmHg LVOT Vmax:      141.00 cm/s LVOT Vmean:     98.100 cm/s LVOT VTI:       0.313 m  AORTA Ao Root diam: 3.40 cm Ao Asc diam:  2.90 cm MITRAL VALVE                TRICUSPID VALVE MV Area (PHT): 6.37 cm     TV Peak grad:   66.9 mmHg MV Decel Time: 119 msec     TV Vmax:        4.09 m/s MV E velocity: 137.00 cm/s  TR Peak grad:   67.2 mmHg MV A velocity: 172.00 cm/s  TR Vmax:        410.00 cm/s MV E/A ratio:  0.80                             SHUNTS                             Systemic VTI:  0.31 m                             Systemic Diam: 1.90 cm Buford Dresser MD Electronically signed by Buford Dresser MD Signature Date/Time: 04/04/2021/12:05:08 PM    Final (Updated)    CT Orbits W Contrast  Result Date: 03/26/2021 CLINICAL DATA:  Orbital cellulitis suspected, left eyelid droop EXAM: CT ORBITS WITH CONTRAST TECHNIQUE: Multidetector CT images was performed according to the standard protocol following intravenous contrast administration. CONTRAST:  89m OMNIPAQUE IOHEXOL 300  MG/ML  SOLN COMPARISON:  No prior CT orbits, correlation is made with 03/07/2021 CT head FINDINGS: Orbits: No orbital mass or evidence of inflammation. Normal appearance of the globes, optic nerve-sheath complexes, extraocular muscles, orbital fat and lacrimal glands. Status post right lens replacement. Visible paranasal sinuses: Minimal mucosal thickening, grossly clear. Soft tissues: Normal. Osseous: No fracture or aggressive lesion. Limited intracranial: No acute or significant finding. IMPRESSION: No acute process in the orbits.  No evidence of orbital cellulitis. Electronically Signed   By: AMerilyn BabaM.D.   On: 03/26/2021 19:28    Microbiology: Results for orders placed or performed during the hospital encounter of 03/26/21  Culture, blood (routine x 2)     Status: None   Collection Time: 03/26/21  6:15 PM   Specimen: BLOOD  Result Value Ref Range Status   Specimen Description BLOOD LEFT ANTECUBITAL  Final   Special Requests   Final    BOTTLES  DRAWN AEROBIC AND ANAEROBIC Blood Culture adequate volume   Culture   Final    NO GROWTH 5 DAYS Performed at [facility], [address] [place], [place] [address]    Report Status 03/31/2021 FINAL  Final  Culture, blood (routine x 2)     Status: None   Collection Time: 03/26/21  6:22 PM   Specimen: BLOOD  Result Value Ref Range Status   Specimen Description BLOOD BLOOD LEFT HAND  Final   Special Requests   Final    BOTTLES DRAWN AEROBIC AND ANAEROBIC Blood Culture adequate volume   Culture   Final    NO GROWTH 5 DAYS Performed at [facility], [address] [place], [place] [address]    Report Status 03/31/2021 FINAL  Final  Resp Panel by RT-PCR (Flu A&B, Covid) Nasopharyngeal Swab     Status: None   Collection Time: 03/27/21 12:02 AM   Specimen: Nasopharyngeal Swab; Nasopharyngeal(NP) swabs in vial transport medium  Result Value Ref Range Status   SARS Coronavirus 2 by RT PCR NEGATIVE NEGATIVE Final     Comment: (NOTE) SARS-CoV-2 target nucleic acids are NOT DETECTED.  The SARS-CoV-2 RNA is generally detectable in upper respiratory specimens during the acute phase of infection. The lowest concentration of SARS-CoV-2 viral copies this assay can detect is 138 copies/mL. A negative result does not preclude SARS-Cov-2 infection and should not be used as the sole basis for treatment or other patient management decisions. A negative result may occur with  improper specimen collection/handling, submission of specimen other than nasopharyngeal swab, presence of viral mutation(s) within the areas targeted by this assay, and inadequate number of viral copies(<138 copies/mL). A negative result must be combined with clinical observations, patient history, and epidemiological information. The expected result is Negative.  Fact Sheet for Patients:  EntrepreneurPulse.com.au  Fact Sheet for Healthcare Providers:  IncredibleEmployment.be  This test is no t yet approved or cleared by the [place] FDA and  has been authorized for detection and/or diagnosis of SARS-CoV-2 by FDA under an Emergency Use Authorization (EUA). This EUA will remain  in effect (meaning this test can be used) for the duration of the COVID-19 declaration under Section 564(b)(1) of the Act, 21 U.S.C.section 360bbb-3(b)(1), unless the authorization is terminated  or revoked sooner.       Influenza A by PCR NEGATIVE NEGATIVE Final   Influenza B by PCR NEGATIVE NEGATIVE Final    Comment: (NOTE) The Xpert Xpress SARS-CoV-2/FLU/RSV plus assay is intended as an aid in the diagnosis of influenza from Nasopharyngeal swab specimens and should not be used as a sole basis for treatment. Nasal washings and aspirates are unacceptable for Xpert Xpress SARS-CoV-2/FLU/RSV testing.  Fact Sheet for Patients: EntrepreneurPulse.com.au  Fact Sheet for Healthcare  Providers: IncredibleEmployment.be  This test is not yet approved or cleared by the [place] FDA and has been authorized for detection and/or diagnosis of SARS-CoV-2 by FDA under an Emergency Use Authorization (EUA). This EUA will remain in effect (meaning this test can be used) for the duration of the COVID-19 declaration under Section 564(b)(1) of the Act, 21 U.S.C. section 360bbb-3(b)(1), unless the authorization is terminated or revoked.  Performed at [facility], [address] [place], [place] [address]   Urine Culture     Status: None   Collection Time: 03/27/21 12:10 AM   Specimen: Urine, Random  Result Value Ref Range Status   Specimen Description   Final    URINE, RANDOM Performed  at [facility], [address] [place], [place] [address]    Special Requests   Final    NONE Performed at [facility], [address] [place], [place] [address]    Culture   Final    NO GROWTH Performed at [facility], [place] [address], [place], [place] [address]    Report Status 03/28/2021 FINAL  Final  MRSA Next Gen by PCR, Nasal     Status: None   Collection Time: 03/27/21 11:19 PM   Specimen: Nasal Mucosa; Nasal Swab  Result Value Ref Range Status   MRSA by PCR Next Gen NOT DETECTED NOT DETECTED Final    Comment: (NOTE) The GeneXpert MRSA Assay (FDA approved for NASAL specimens only), is one component of a comprehensive MRSA colonization surveillance program. It is not intended to diagnose MRSA infection nor to guide or monitor treatment for MRSA infections. Test performance is not FDA approved in patients less than 40 years old. Performed at [facility], [address] [place], [place] [address]     Labs: CBC: Recent Labs  Lab 03/30/21 [phone] 03/31/21 0519 04/01/21 0445 04/03/21 0533 04/04/21 0457  WBC 9.9 10.0 12.1* 8.7 6.3  NEUTROABS  --   --   --  7.5  --   HGB 8.7* 9.2* 9.4* 9.7* 9.8*  HCT  27.3* 28.9* 29.7* 31.2* 31.7*  MCV 97.5 97.6 97.1 99.4 100.3*  PLT 120* 126* 148* 189 388   Basic Metabolic Panel: Recent Labs  Lab 03/31/21 0519 04/01/21 0445 04/02/21 0806 04/03/21 0533 04/04/21 0457  NA 136 135 140 141 145  K 4.0 4.1 4.1 3.2* 2.9*  CL 103 102 107 105 103  CO2 28 28 28 28 31   GLUCOSE 287* 280* 133* 109* 145*  BUN 33* 27* 21 21 22   CREATININE 1.18 0.92 1.06 0.92 1.08  CALCIUM 8.2* 8.3* 8.3* 8.2* 8.5*   Liver Function Tests: Recent Labs  Lab 04/03/21 0533  AST 38  ALT 63*  ALKPHOS 754*  BILITOT 1.4*  PROT 5.8*  ALBUMIN 2.6*   CBG: Recent Labs  Lab 04/03/21 1637 04/03/21 2020 04/04/21 0840 04/04/21 1240 04/04/21 2302  GLUCAP 144* 126* 161* 160* 139*    Discharge time spent: greater than 30 minutes.  Signed:  Max Sane MD.  [facility] 04/27/21

## 2021-04-11 NOTE — Progress Notes (Signed)
OT Cancellation Note  Patient Details Name: Matthew Miles. MRN: [id number] DOB: Aug 25, 1928   Cancelled Treatment:    Reason Eval/Treat Not Completed: Other (comment) Chart review indicates that pt is transitioned to comfort measures with disposition changed to hospice. Will complete OT orders at this time. Thank you for consulting occupational therapy in the care of this patient.   Gerrianne Scale, [place], OTR/L ascom [phone] 04-16-2021, 8:40 AM

## 2021-04-11 NOTE — TOC Progression Note (Signed)
Transition of Care (TOC) - Progression Note    Patient Details  Name: Matthew Miles. MRN: [id number] Date of Birth: 1929-03-27  Transition of Care [facility]) CM/SW [place], RN Phone Number: 04-21-2021, 10:52 AM  Clinical Narrative:   Patient will discharge to hospice house today as per Santiago Glad from [facility]  Family notified, they will meet [facility] rep here to discuss and then EMS will transport.  TOC to follow for needs.    Expected Discharge Plan: [place] Barriers to Discharge: Continued Medical Work up  Expected Discharge Plan and Services Expected Discharge Plan: [place] In-house Referral: Clinical Social Work   Post Acute Care Choice: [place] Living arrangements for the past 2 months: [place]                                       Social Determinants of Health (SDOH) Interventions    Readmission Risk Interventions No flowsheet data found.

## 2021-04-11 NOTE — Progress Notes (Signed)
PT Cancellation Note  Patient Details Name: Matthew Miles. MRN: [id number] DOB: 07-Jul-1928   Cancelled Treatment:    Reason Eval/Treat Not Completed: Other (comment) Patient has transitioned to comfort measures per review of chart. Physical Therapy will sign off at this time.   Minna Merritts, PT, MPT  Percell Locus 04/25/21, 9:27 AM

## 2021-04-11 DEATH — deceased

## 2021-06-29 ENCOUNTER — Ambulatory Visit: Payer: Medicare HMO

## 2021-08-17 ENCOUNTER — Ambulatory Visit: Payer: Medicare HMO | Admitting: Family Medicine

## 2024-02-27 NOTE — Telephone Encounter (Signed)
 open in error
